# Patient Record
Sex: Female | Born: 1940 | Race: Black or African American | Hispanic: No | Marital: Married | Smoking: Never smoker
Health system: Southern US, Community
[De-identification: ages and names within clinical notes are randomized; demographics above are authoritative.]

## PROBLEM LIST (undated history)

## (undated) DIAGNOSIS — R51 Headache: Secondary | ICD-10-CM

## (undated) DIAGNOSIS — F32A Depression, unspecified: Secondary | ICD-10-CM

## (undated) DIAGNOSIS — F419 Anxiety disorder, unspecified: Secondary | ICD-10-CM

## (undated) DIAGNOSIS — Z9889 Other specified postprocedural states: Secondary | ICD-10-CM

## (undated) DIAGNOSIS — E7800 Pure hypercholesterolemia, unspecified: Secondary | ICD-10-CM

## (undated) DIAGNOSIS — M1990 Unspecified osteoarthritis, unspecified site: Secondary | ICD-10-CM

## (undated) DIAGNOSIS — N390 Urinary tract infection, site not specified: Secondary | ICD-10-CM

## (undated) DIAGNOSIS — F439 Reaction to severe stress, unspecified: Secondary | ICD-10-CM

## (undated) DIAGNOSIS — R112 Nausea with vomiting, unspecified: Secondary | ICD-10-CM

## (undated) DIAGNOSIS — F329 Major depressive disorder, single episode, unspecified: Secondary | ICD-10-CM

## (undated) DIAGNOSIS — K219 Gastro-esophageal reflux disease without esophagitis: Secondary | ICD-10-CM

## (undated) DIAGNOSIS — R0600 Dyspnea, unspecified: Secondary | ICD-10-CM

## (undated) DIAGNOSIS — G2581 Restless legs syndrome: Secondary | ICD-10-CM

## (undated) DIAGNOSIS — I10 Essential (primary) hypertension: Secondary | ICD-10-CM

## (undated) DIAGNOSIS — F0390 Unspecified dementia without behavioral disturbance: Secondary | ICD-10-CM

## (undated) HISTORY — DX: Anxiety disorder, unspecified: F41.9

## (undated) HISTORY — PX: ABDOMINAL HYSTERECTOMY: SHX81

## (undated) HISTORY — DX: Major depressive disorder, single episode, unspecified: F32.9

## (undated) HISTORY — DX: Dyspnea, unspecified: R06.00

## (undated) HISTORY — DX: Restless legs syndrome: G25.81

## (undated) HISTORY — PX: OTHER SURGICAL HISTORY: SHX169

## (undated) HISTORY — DX: Reaction to severe stress, unspecified: F43.9

## (undated) HISTORY — DX: Pure hypercholesterolemia, unspecified: E78.00

## (undated) HISTORY — PX: COLONOSCOPY: SHX174

## (undated) HISTORY — DX: Essential (primary) hypertension: I10

## (undated) HISTORY — DX: Depression, unspecified: F32.A

## (undated) HISTORY — DX: Unspecified osteoarthritis, unspecified site: M19.90

## (undated) HISTORY — DX: Gastro-esophageal reflux disease without esophagitis: K21.9

## (undated) HISTORY — PX: WISDOM TOOTH EXTRACTION: SHX21

## (undated) HISTORY — PX: REPLACEMENT TOTAL KNEE: SUR1224

## (undated) HISTORY — PX: JOINT REPLACEMENT: SHX530

---

## 1998-06-16 ENCOUNTER — Ambulatory Visit (HOSPITAL_COMMUNITY): Admission: RE | Admit: 1998-06-16 | Discharge: 1998-06-16 | Payer: Self-pay | Admitting: Gastroenterology

## 1999-05-04 ENCOUNTER — Ambulatory Visit (HOSPITAL_COMMUNITY): Admission: RE | Admit: 1999-05-04 | Discharge: 1999-05-04 | Payer: Self-pay | Admitting: Emergency Medicine

## 1999-10-11 ENCOUNTER — Encounter: Payer: Self-pay | Admitting: Emergency Medicine

## 1999-10-11 ENCOUNTER — Encounter: Admission: RE | Admit: 1999-10-11 | Discharge: 1999-10-11 | Payer: Self-pay | Admitting: Emergency Medicine

## 2000-07-19 ENCOUNTER — Encounter: Payer: Self-pay | Admitting: Emergency Medicine

## 2000-07-19 ENCOUNTER — Encounter: Admission: RE | Admit: 2000-07-19 | Discharge: 2000-07-19 | Payer: Self-pay | Admitting: Emergency Medicine

## 2000-10-25 ENCOUNTER — Encounter: Payer: Self-pay | Admitting: Emergency Medicine

## 2000-10-25 ENCOUNTER — Encounter: Admission: RE | Admit: 2000-10-25 | Discharge: 2000-10-25 | Payer: Self-pay | Admitting: Emergency Medicine

## 2001-11-02 ENCOUNTER — Encounter: Admission: RE | Admit: 2001-11-02 | Discharge: 2001-11-02 | Payer: Self-pay | Admitting: Emergency Medicine

## 2001-11-02 ENCOUNTER — Encounter: Payer: Self-pay | Admitting: Emergency Medicine

## 2002-11-04 ENCOUNTER — Encounter: Admission: RE | Admit: 2002-11-04 | Discharge: 2002-11-04 | Payer: Self-pay | Admitting: Emergency Medicine

## 2002-11-04 ENCOUNTER — Encounter: Payer: Self-pay | Admitting: Emergency Medicine

## 2002-11-12 ENCOUNTER — Encounter: Admission: RE | Admit: 2002-11-12 | Discharge: 2002-11-12 | Payer: Self-pay | Admitting: Emergency Medicine

## 2002-11-12 ENCOUNTER — Encounter: Payer: Self-pay | Admitting: Emergency Medicine

## 2003-09-19 ENCOUNTER — Ambulatory Visit (HOSPITAL_COMMUNITY): Admission: RE | Admit: 2003-09-19 | Discharge: 2003-09-19 | Payer: Self-pay | Admitting: Gastroenterology

## 2003-09-19 ENCOUNTER — Encounter (INDEPENDENT_AMBULATORY_CARE_PROVIDER_SITE_OTHER): Payer: Self-pay | Admitting: Specialist

## 2003-11-04 ENCOUNTER — Encounter: Admission: RE | Admit: 2003-11-04 | Discharge: 2003-11-04 | Payer: Self-pay | Admitting: Emergency Medicine

## 2004-08-05 ENCOUNTER — Encounter: Admission: RE | Admit: 2004-08-05 | Discharge: 2004-08-05 | Payer: Self-pay | Admitting: Emergency Medicine

## 2004-11-18 ENCOUNTER — Encounter: Admission: RE | Admit: 2004-11-18 | Discharge: 2004-11-18 | Payer: Self-pay | Admitting: Emergency Medicine

## 2005-08-29 ENCOUNTER — Encounter: Admission: RE | Admit: 2005-08-29 | Discharge: 2005-08-29 | Payer: Self-pay | Admitting: Emergency Medicine

## 2005-11-17 ENCOUNTER — Encounter: Admission: RE | Admit: 2005-11-17 | Discharge: 2005-11-17 | Payer: Self-pay | Admitting: Emergency Medicine

## 2006-10-13 ENCOUNTER — Ambulatory Visit: Payer: Self-pay | Admitting: Cardiovascular Disease

## 2006-10-31 ENCOUNTER — Ambulatory Visit: Payer: Self-pay

## 2006-11-18 ENCOUNTER — Inpatient Hospital Stay (HOSPITAL_COMMUNITY): Admission: EM | Admit: 2006-11-18 | Discharge: 2006-11-19 | Payer: Self-pay | Admitting: Emergency Medicine

## 2006-12-12 ENCOUNTER — Encounter: Admission: RE | Admit: 2006-12-12 | Discharge: 2006-12-12 | Payer: Self-pay | Admitting: Emergency Medicine

## 2006-12-21 ENCOUNTER — Encounter: Admission: RE | Admit: 2006-12-21 | Discharge: 2006-12-21 | Payer: Self-pay | Admitting: Emergency Medicine

## 2007-05-30 ENCOUNTER — Encounter: Admission: RE | Admit: 2007-05-30 | Discharge: 2007-05-30 | Payer: Self-pay | Admitting: Emergency Medicine

## 2007-11-06 ENCOUNTER — Ambulatory Visit: Payer: Self-pay | Admitting: Cardiovascular Disease

## 2007-12-13 ENCOUNTER — Encounter: Admission: RE | Admit: 2007-12-13 | Discharge: 2007-12-13 | Payer: Self-pay | Admitting: Emergency Medicine

## 2008-05-15 ENCOUNTER — Encounter: Payer: Self-pay | Admitting: Cardiovascular Disease

## 2008-05-15 ENCOUNTER — Ambulatory Visit: Payer: Self-pay | Admitting: Cardiovascular Disease

## 2008-05-15 DIAGNOSIS — F329 Major depressive disorder, single episode, unspecified: Secondary | ICD-10-CM

## 2008-05-15 DIAGNOSIS — R609 Edema, unspecified: Secondary | ICD-10-CM

## 2008-05-15 DIAGNOSIS — E7800 Pure hypercholesterolemia, unspecified: Secondary | ICD-10-CM

## 2008-05-15 DIAGNOSIS — F341 Dysthymic disorder: Secondary | ICD-10-CM

## 2008-05-15 DIAGNOSIS — M1990 Unspecified osteoarthritis, unspecified site: Secondary | ICD-10-CM | POA: Insufficient documentation

## 2008-05-15 DIAGNOSIS — R0989 Other specified symptoms and signs involving the circulatory and respiratory systems: Secondary | ICD-10-CM

## 2008-05-15 DIAGNOSIS — R9431 Abnormal electrocardiogram [ECG] [EKG]: Secondary | ICD-10-CM

## 2008-05-15 DIAGNOSIS — I10 Essential (primary) hypertension: Secondary | ICD-10-CM

## 2008-05-15 DIAGNOSIS — R0609 Other forms of dyspnea: Secondary | ICD-10-CM | POA: Insufficient documentation

## 2008-11-04 ENCOUNTER — Ambulatory Visit: Payer: Self-pay | Admitting: Cardiovascular Disease

## 2008-12-15 ENCOUNTER — Encounter: Admission: RE | Admit: 2008-12-15 | Discharge: 2008-12-15 | Payer: Self-pay | Admitting: Internal Medicine

## 2009-04-30 ENCOUNTER — Ambulatory Visit: Payer: Self-pay | Admitting: Cardiovascular Disease

## 2009-06-30 ENCOUNTER — Ambulatory Visit: Payer: Self-pay

## 2009-07-24 ENCOUNTER — Telehealth (INDEPENDENT_AMBULATORY_CARE_PROVIDER_SITE_OTHER): Payer: Self-pay | Admitting: *Deleted

## 2009-10-29 ENCOUNTER — Ambulatory Visit: Payer: Self-pay | Admitting: Cardiovascular Disease

## 2009-12-22 ENCOUNTER — Encounter: Admission: RE | Admit: 2009-12-22 | Discharge: 2009-12-22 | Payer: Self-pay | Admitting: Internal Medicine

## 2010-03-14 ENCOUNTER — Encounter: Payer: Self-pay | Admitting: Emergency Medicine

## 2010-03-15 ENCOUNTER — Encounter: Payer: Self-pay | Admitting: Emergency Medicine

## 2010-03-23 NOTE — Assessment & Plan Note (Signed)
Summary: Beto.Dent   CC:  6 month check up.  History of Present Illness: Sole is seen today in followup for hypertension dyspnea and lower extremity edema. She has been doing well. She seems tired and is depressed about not seeing Dr. Lorenz Coaster anymore.  Her new primary is [facility].  She has a lot of trouble with her knees and has ssen Dr. Corliss Skains for injections and Dr. Despina Hick.  She has improved LE edema on diuretics and her BP has been under good control.  She has mild chronic exertional dyspnea but her activity is limited by her knees at this point.  She has had some balance issues and is getting B12 shots now.    Current Problems (verified): 1)  Edema  (ICD-782.3) 2)  Electrocardiogram, Abnormal  (ICD-794.31) 3)  Anxiety Depression  (ICD-300.4) 4)  Dyspnea On Exertion  (ICD-786.09) 5)  Diabetes Mellitus, Type II, Hx of  (ICD-V12.2) 6)  Osteoarthritis  (ICD-715.90) 7)  Hypercholesterolemia  (ICD-272.0) 8)  Hypertension  (ICD-401.9) 9)  Depression  (ICD-311)  Current Medications (verified): 1)  Metformin Hcl 850 Mg Tabs (Metformin Hcl) .... Three Times A Day 2)  Verapamil Hcl Cr 240 Mg Xr24h-Cap (Verapamil Hcl) .... Take One Capsule By Mouth Two Times A Day 3)  Aspirin Ec 325 Mg Tbec (Aspirin) .... Take One Tablet By Mouth Daily 4)  Alprazolam 0.5 Mg Tabs (Alprazolam) .... Two Times A Day 5)  Mirapex 0.125 Mg Tabs (Pramipexole Dihydrochloride) .... Three Times A Day 6)  Metoprolol Succinate 100 Mg Xr24h-Tab (Metoprolol Succinate) .... Take One Tablet By Mouth Daily 7)  Torsemide 10 Mg Tabs (Torsemide) .Marland Kitchen.. 1 Tab By Mouth Once Daily 8)  Glipizide 5 Mg Tabs (Glipizide) .Marland Kitchen.. 1 Tab By Mouth Once Daily 9)  Cozaar 100 Mg Tabs (Losartan Potassium) .Marland Kitchen.. 1 Tab By Mouth Once Daily  Allergies (verified): 1)  ! Sulfa  Past History:  Past Medical History: Last updated: 05/15/2008 Current Problems:  ANXIETY DEPRESSION (ICD-300.4) DYSPNEA ON EXERTION (ICD-786.09) DIABETES MELLITUS, TYPE II, HX  OF (ICD-V12.2) OSTEOARTHRITIS (ICD-715.90) HYPERCHOLESTEROLEMIA (ICD-272.0) HYPERTENSION (ICD-401.9) DEPRESSION (ICD-311)  Restless leg syndrome.   Stress.   Past Surgical History: Last updated: 05/15/2008  Hysterectomy.  Colonoscopy  Family History: Last updated: 05/15/2008 Positive for diabetes.  Social History: Last updated: 05/15/2008 Married  Tobacco Use - Yes.  Alcohol Use - no  Review of Systems       Denies fever, malais, weight loss, blurry vision, decreased visual acuity, cough, sputum, SOB, hemoptysis, pleuritic pain, palpitaitons, heartburn, abdominal pain, melena, lower extremity edema, claudication, or rash.   Vital Signs:  Patient profile:   70 year old female Height:      68 inches Weight:      196 pounds BMI:     29.91 Pulse rate:   60 / minute Resp:     12 per minute BP sitting:   121 / 72  (left arm)  Vitals Entered By: Kem Parkinson (April 30, 2009 8:09 AM)  Physical Exam  General:  Affect appropriate Healthy:  appears stated age HEENT: normal Neck supple with no adenopathy JVP normal no bruits no thyromegaly Lungs clear with no wheezing and good diaphragmatic motion Heart:  S1/S2 no murmur,rub, gallop or click PMI normal Abdomen: benighn, BS positve, no tenderness, no AAA no bruit.  No HSM or HJR Distal pulses intact with no bruits No edema Neuro non-focal Skin warm and dry    Impression & Recommendations:  Problem # 1:  EDEMA (ICD-782.3)  Resolved  with current dose of diuretic.  Low sodium diet  Orders: EKG w/ Interpretation (93000)  Problem # 2:  DYSPNEA ON EXERTION (ICD-786.09) Baseline with no evidence of cardiac limitations The following medications were removed from the medication list:    Lisinopril 20 Mg Tabs (Lisinopril) .Marland Kitchen... Take one tablet by mouth daily    Lisinopril 40 Mg Tabs (Lisinopril) .Marland Kitchen... Take one tablet by mouth daily Her updated medication list for this problem includes:    Verapamil Hcl Cr 240  Mg Xr24h-cap (Verapamil hcl) .Marland Kitchen... Take one capsule by mouth two times a day    Aspirin Ec 325 Mg Tbec (Aspirin) .Marland Kitchen... Take one tablet by mouth daily    Metoprolol Succinate 100 Mg Xr24h-tab (Metoprolol succinate) .Marland Kitchen... Take one tablet by mouth daily    Torsemide 10 Mg Tabs (Torsemide) .Marland Kitchen... 1 tab by mouth once daily    Cozaar 100 Mg Tabs (Losartan potassium) .Marland Kitchen... 1 tab by mouth once daily  Problem # 3:  HYPERTENSION (ICD-401.9) Meds adjusted by Dr Jarold Motto.  Under good control The following medications were removed from the medication list:    Lisinopril 20 Mg Tabs (Lisinopril) .Marland Kitchen... Take one tablet by mouth daily    Lisinopril 40 Mg Tabs (Lisinopril) .Marland Kitchen... Take one tablet by mouth daily Her updated medication list for this problem includes:    Verapamil Hcl Cr 240 Mg Xr24h-cap (Verapamil hcl) .Marland Kitchen... Take one capsule by mouth two times a day    Aspirin Ec 325 Mg Tbec (Aspirin) .Marland Kitchen... Take one tablet by mouth daily    Metoprolol Succinate 100 Mg Xr24h-tab (Metoprolol succinate) .Marland Kitchen... Take one tablet by mouth daily    Torsemide 10 Mg Tabs (Torsemide) .Marland Kitchen... 1 tab by mouth once daily    Cozaar 100 Mg Tabs (Losartan potassium) .Marland Kitchen... 1 tab by mouth once daily  Orders: EKG w/ Interpretation (93000)  Problem # 4:  HYPERCHOLESTEROLEMIA (ICD-272.0) Continue current diet Target LDL less than 130 with no known vascular disease  Patient Instructions: 1)  Your physician wants you to follow-up in: 6 months.  You will receive a reminder letter in the mail two months in advance. If you don't receive a letter, please call our office to schedule the follow-up appointment.   EKG Report  Procedure date:  04/30/2009  Findings:      NSR LAD Voltage for LVH Nonspecific ST/T wave changes Abnromal ECG

## 2010-03-23 NOTE — Assessment & Plan Note (Signed)
Summary: f41m   CC:  check up.  History of Present Illness: [name] is seen today in followup for hypertension dyspnea and lower extremity edema. She has been doing well. She seems tired and is depressed about not seeing Dr. Lorenz Coaster anymore.  Her new primary is [facility].  She has a lot of trouble with her knees and has ssen Dr. Corliss Skains for injections and Dr. Despina Hick.  She has improved LE edema on diuretics and her BP has been under good control.  She has mild chronic exertional dyspnea but her activity is limited by her knees at this point.  She has had some balance issues and is getting B12 shots now.   Current Problems (verified): 1)  Edema  (ICD-782.3) 2)  Electrocardiogram, Abnormal  (ICD-794.31) 3)  Anxiety Depression  (ICD-300.4) 4)  Dyspnea On Exertion  (ICD-786.09) 5)  Diabetes Mellitus, Type II, Hx of  (ICD-V12.2) 6)  Osteoarthritis  (ICD-715.90) 7)  Hypercholesterolemia  (ICD-272.0) 8)  Hypertension  (ICD-401.9) 9)  Depression  (ICD-311)  Current Medications (verified): 1)  Metformin Hcl 850 Mg Tabs (Metformin Hcl) .... Three Times A Day 2)  Verapamil Hcl Cr 240 Mg Xr24h-Cap (Verapamil Hcl) .... Take One Capsule By Mouth Two Times A Day 3)  Aspirin Ec 325 Mg Tbec (Aspirin) .... Take One Tablet By Mouth Daily 4)  Alprazolam 0.5 Mg Tabs (Alprazolam) .... Two Times A Day 5)  Mirapex 0.125 Mg Tabs (Pramipexole Dihydrochloride) .... Three Times A Day 6)  Metoprolol Succinate 100 Mg Xr24h-Tab (Metoprolol Succinate) .... Take One Tablet By Mouth Daily 7)  Torsemide 10 Mg Tabs (Torsemide) .Marland Kitchen.. 1 Tab By Mouth Once Daily 8)  Glipizide 5 Mg Tabs (Glipizide) .Marland Kitchen.. 1 Tab By Mouth Once Daily  Allergies (verified): 1)  ! Sulfa  Past History:  Past Medical History: Last updated: 05/15/2008 Current Problems:  ANXIETY DEPRESSION (ICD-300.4) DYSPNEA ON EXERTION (ICD-786.09) DIABETES MELLITUS, TYPE II, HX OF (ICD-V12.2) OSTEOARTHRITIS (ICD-715.90) HYPERCHOLESTEROLEMIA  (ICD-272.0) HYPERTENSION (ICD-401.9) DEPRESSION (ICD-311)  Restless leg syndrome.   Stress.   Past Surgical History: Last updated: 05/15/2008  Hysterectomy.  Colonoscopy  Family History: Last updated: 05/15/2008 Positive for diabetes.  Social History: Last updated: 05/15/2008 Married  Tobacco Use - Yes.  Alcohol Use - no  Review of Systems       Denies fever, malais, weight loss, blurry vision, decreased visual acuity, cough, sputum, SOB, hemoptysis, pleuritic pain, palpitaitons, heartburn, abdominal pain, melena, lower extremity edema, claudication, or rash.   Vital Signs:  Patient profile:   70 year old female Height:      68 inches Weight:      199 pounds BMI:     30.37 Pulse rate:   80 / minute Resp:     12 per minute BP sitting:   124 / 84  (left arm)  Vitals Entered By: Kem Parkinson (October 29, 2009 1:58 PM)  Physical Exam  General:  Affect appropriate Healthy:  appears stated age HEENT: normal Neck supple with no adenopathy JVP normal no bruits no thyromegaly Lungs clear with no wheezing and good diaphragmatic motion Heart:  S1/S2 no murmur,rub, gallop or click PMI normal Abdomen: benighn, BS positve, no tenderness, no AAA no bruit.  No HSM or HJR Distal pulses intact with no bruits No edema Neuro non-focal Skin warm and dry    Impression & Recommendations:  Problem # 1:  EDEMA (ICD-782.3) Stable  Continue low sodium diet and elevate legs at end of day  Continue current dose of diuretic  Problem # 2:  DYSPNEA ON EXERTION (ICD-786.09) Functional Normal exam and normal EF  Observe The following medications were removed from the medication list:    Cozaar 100 Mg Tabs (Losartan potassium) .Marland Kitchen... 1 tab by mouth once daily Her updated medication list for this problem includes:    Verapamil Hcl Cr 240 Mg Xr24h-cap (Verapamil hcl) .Marland Kitchen... Take one capsule by mouth two times a day    Aspirin Ec 325 Mg Tbec (Aspirin) .Marland Kitchen... Take one tablet by mouth  daily    Metoprolol Succinate 100 Mg Xr24h-tab (Metoprolol succinate) .Marland Kitchen... Take one tablet by mouth daily    Torsemide 10 Mg Tabs (Torsemide) .Marland Kitchen... 1 tab by mouth once daily  Problem # 3:  HYPERTENSION (ICD-401.9) Well controlled The following medications were removed from the medication list:    Cozaar 100 Mg Tabs (Losartan potassium) .Marland Kitchen... 1 tab by mouth once daily Her updated medication list for this problem includes:    Verapamil Hcl Cr 240 Mg Xr24h-cap (Verapamil hcl) .Marland Kitchen... Take one capsule by mouth two times a day    Aspirin Ec 325 Mg Tbec (Aspirin) .Marland Kitchen... Take one tablet by mouth daily    Metoprolol Succinate 100 Mg Xr24h-tab (Metoprolol succinate) .Marland Kitchen... Take one tablet by mouth daily    Torsemide 10 Mg Tabs (Torsemide) .Marland Kitchen... 1 tab by mouth once daily  Patient Instructions: 1)  Your physician recommends that you schedule a follow-up appointment in: 6 MONTHS

## 2010-03-23 NOTE — Progress Notes (Signed)
  Phone Note Outgoing Call   Call placed by: Deliah Goody, RN,  July 24, 2009 4:39 PM Summary of Call: pt aware that event monitor shows NSR, sinus arrythmia Deliah Goody, RN  July 24, 2009 4:39 PM

## 2010-04-29 ENCOUNTER — Ambulatory Visit: Payer: Self-pay | Admitting: Cardiovascular Disease

## 2010-05-07 ENCOUNTER — Encounter: Payer: Self-pay | Admitting: Cardiovascular Disease

## 2010-05-20 ENCOUNTER — Ambulatory Visit: Payer: Self-pay | Admitting: Cardiovascular Disease

## 2010-05-24 ENCOUNTER — Emergency Department (HOSPITAL_COMMUNITY)
Admission: EM | Admit: 2010-05-24 | Discharge: 2010-05-24 | Discharge status: Against Medical Advice | Payer: Medicare Other | Attending: Emergency Medicine | Admitting: Emergency Medicine

## 2010-05-24 DIAGNOSIS — Z0389 Encounter for observation for other suspected diseases and conditions ruled out: Secondary | ICD-10-CM | POA: Insufficient documentation

## 2010-05-31 ENCOUNTER — Inpatient Hospital Stay (HOSPITAL_COMMUNITY): Payer: Medicare Other

## 2010-05-31 ENCOUNTER — Inpatient Hospital Stay (HOSPITAL_COMMUNITY)
Admission: AD | Admit: 2010-05-31 | Discharge: 2010-06-02 | DRG: 866 | Disposition: A | Discharge status: Home / Self Care | Payer: Medicare Other | Source: Ambulatory Visit | Attending: Internal Medicine | Admitting: Internal Medicine

## 2010-05-31 DIAGNOSIS — K219 Gastro-esophageal reflux disease without esophagitis: Secondary | ICD-10-CM | POA: Diagnosis present

## 2010-05-31 DIAGNOSIS — R0789 Other chest pain: Secondary | ICD-10-CM | POA: Diagnosis present

## 2010-05-31 DIAGNOSIS — R112 Nausea with vomiting, unspecified: Secondary | ICD-10-CM | POA: Diagnosis present

## 2010-05-31 DIAGNOSIS — K5900 Constipation, unspecified: Secondary | ICD-10-CM | POA: Diagnosis present

## 2010-05-31 DIAGNOSIS — B9789 Other viral agents as the cause of diseases classified elsewhere: Principal | ICD-10-CM | POA: Diagnosis present

## 2010-05-31 DIAGNOSIS — R269 Unspecified abnormalities of gait and mobility: Secondary | ICD-10-CM | POA: Diagnosis present

## 2010-05-31 LAB — COMPREHENSIVE METABOLIC PANEL
AST: 15 U/L (ref 0–37)
Chloride: 104 mEq/L (ref 96–112)
GFR calc Af Amer: >60 mL/min (ref >60)
Glucose, Bld: 194 mg/dL — ABNORMAL HIGH (ref 70–99)
Potassium: 3.7 mEq/L (ref 3.5–5.1)
Total Bilirubin: 1.2 mg/dL (ref 0.3–1.2)

## 2010-05-31 LAB — URINALYSIS, ROUTINE W REFLEX MICROSCOPIC
Ketones, ur: 15 mg/dL — AB
pH: 6.5 (ref 5.0–8.0)

## 2010-05-31 LAB — URINE MICROSCOPIC-ADD ON

## 2010-05-31 LAB — TROPONIN I: Troponin I: 0.02 ng/mL (ref 0.00–0.06)

## 2010-05-31 LAB — CBC
MCHC: 33.5 g/dL (ref 30.0–36.0)
Platelets: 234 10*3/uL (ref 150–400)

## 2010-06-01 ENCOUNTER — Inpatient Hospital Stay (HOSPITAL_COMMUNITY): Payer: Medicare Other

## 2010-06-01 LAB — GLUCOSE, CAPILLARY
Glucose-Capillary: 120 mg/dL — ABNORMAL HIGH (ref 70–99)
Glucose-Capillary: 202 mg/dL — ABNORMAL HIGH (ref 70–99)

## 2010-06-01 LAB — TROPONIN I: Troponin I: 0.01 ng/mL (ref 0.00–0.06)

## 2010-06-02 LAB — GLUCOSE, CAPILLARY: Glucose-Capillary: 127 mg/dL — ABNORMAL HIGH (ref 70–99)

## 2010-06-02 LAB — TROPONIN I: Troponin I: 0.02 ng/mL (ref 0.00–0.06)

## 2010-06-03 ENCOUNTER — Encounter: Payer: Self-pay | Admitting: Cardiovascular Disease

## 2010-06-03 ENCOUNTER — Ambulatory Visit (INDEPENDENT_AMBULATORY_CARE_PROVIDER_SITE_OTHER): Payer: Medicare Other | Admitting: Cardiovascular Disease

## 2010-06-03 ENCOUNTER — Ambulatory Visit (INDEPENDENT_AMBULATORY_CARE_PROVIDER_SITE_OTHER)
Admission: RE | Admit: 2010-06-03 | Discharge: 2010-06-03 | Disposition: A | Discharge status: Home / Self Care | Payer: Medicare Other | Source: Ambulatory Visit | Attending: Cardiovascular Disease | Admitting: Cardiovascular Disease

## 2010-06-03 DIAGNOSIS — F29 Unspecified psychosis not due to a substance or known physiological condition: Secondary | ICD-10-CM

## 2010-06-03 DIAGNOSIS — R410 Disorientation, unspecified: Secondary | ICD-10-CM

## 2010-06-03 DIAGNOSIS — I10 Essential (primary) hypertension: Secondary | ICD-10-CM

## 2010-06-03 DIAGNOSIS — S0990XA Unspecified injury of head, initial encounter: Secondary | ICD-10-CM

## 2010-06-03 DIAGNOSIS — E7800 Pure hypercholesterolemia, unspecified: Secondary | ICD-10-CM

## 2010-06-03 DIAGNOSIS — R55 Syncope and collapse: Secondary | ICD-10-CM

## 2010-06-03 DIAGNOSIS — R609 Edema, unspecified: Secondary | ICD-10-CM

## 2010-06-03 DIAGNOSIS — R9431 Abnormal electrocardiogram [ECG] [EKG]: Secondary | ICD-10-CM

## 2010-06-03 NOTE — Assessment & Plan Note (Signed)
Likely need for right knee surgery. Multiple CRF;s including DM.  Anterolateral ECG changes.  Lexiscan myovue

## 2010-06-03 NOTE — Assessment & Plan Note (Signed)
F/U labs Dr Jarold Motto  Continue low chol diet

## 2010-06-03 NOTE — Assessment & Plan Note (Signed)
Concerned about Louse's mental status.  With fall and bump on back of head will due CT to R/O subdural

## 2010-06-03 NOTE — Progress Notes (Signed)
[name] is seen today in followup for hypertension dyspnea and lower extremity edema. She has been doing well. She seems tired and is depressed about not seeing Dr. Lorenz Coaster anymore.  Her new primary is [facility].  She has a lot of trouble with her knees and has ssen Dr. Corliss Skains for injections and Dr. Despina Hick. [name] thinks it is time she has surgery.  She has been tripping and falling a lot.     She has improved LE edema on diuretics and her BP has been under good control.  She has mild chronic exertional dyspnea but her activity is limited by her knees at this point.  She has had some balance issues and is getting B12 shots now.   Her mental status was off today.  She appeared slow to mentate.  She says she was in the hospital earlier this week at [facility] for vohmiting and diarhea.  ER records indicate visit for fall but no scan.  No focal neuro signs.   [name] indicated she tripped and fell backward hitting her head on Monday.  ROS: Denies fever, malais, weight loss, blurry vision, decreased visual acuity, cough, sputum, SOB, hemoptysis, pleuritic pain, palpitaitons, heartburn, abdominal pain, melena, lower extremity edema, claudication, or rash.  Called [facility] and got notes from [facility] 4/9-4/11  Viral syndrome  Negative UGI.  Some SSCP ? From GERD  Negative enzymes  ECG with anteroalteral T wave changes.  D/C for outpt F/U with me and primary   General: Affect appropriate Healthy:  appears stated age HEENT: normal Neck supple with no adenopathy JVP normal no bruits no thyromegaly Lungs clear with no wheezing and good diaphragmatic motion Heart:  S1/S2 SEM  murmur,rub, gallop or click PMI normal Abdomen: benighn, BS positve, no tenderness, no AAA no bruit.  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 ALPRAZolam (XANAX) 0.5 MG tablet Take 0.5 mg by mouth at bedtime as needed.        Marland Kitchen aspirin 325 MG tablet Take 325 mg by mouth daily.        Marland Kitchen glipiZIDE (GLUCOTROL) 5 MG tablet 1 tab po qd      . metFORMIN (GLUCOPHAGE) 850 MG tablet Take 850 mg by mouth 3 (three) times daily.        . metoprolol (TOPROL-XL) 100 MG 24 hr tablet Take 100 mg by mouth daily.        Marland Kitchen torsemide (DEMADEX) 10 MG tablet Take 10 mg by mouth daily.        . verapamil (CALAN-SR) 240 MG CR tablet Take 240 mg by mouth at bedtime.        . pramipexole (MIRAPEX) 0.125 MG tablet Take 0.125 mg by mouth 3 (three) times daily.          Allergies  Sulfonamide derivatives  Electrocardiogram:  06/02/10  NSR 57 LAD Anterolateral T wave changes  Assessment and Plan

## 2010-06-03 NOTE — Assessment & Plan Note (Signed)
Continue current diuretic dose  Euvolemic

## 2010-06-03 NOTE — Assessment & Plan Note (Signed)
Well controlled.  Continue current medications and low sodium Dash type diet.    

## 2010-06-03 NOTE — Patient Instructions (Signed)
Your physician has requested that you have a lexiscan myoview. For further information please visit [email or web address] Please follow instruction sheet, as given.   Your physician has requested that you have an echocardiogram. Echocardiography is a painless test that uses sound waves to create images of your heart. It provides your doctor with information about the size and shape of your heart and how well your heart's chambers and valves are working. This procedure takes approximately one hour. There are no restrictions for this procedure.   Non-Cardiac CT scanning, (CAT scanning), is a noninvasive, special x-ray that produces cross-sectional images of the body using x-rays and a computer. CT scans help physicians diagnose and treat medical conditions. For some CT exams, a contrast material is used to enhance visibility in the area of the body being studied. CT scans provide greater clarity and reveal more details than regular x-ray exams.   Your physician recommends that you schedule a follow-up appointment in: 6 MONTHS

## 2010-06-15 ENCOUNTER — Ambulatory Visit (HOSPITAL_BASED_OUTPATIENT_CLINIC_OR_DEPARTMENT_OTHER): Payer: Medicare Other | Admitting: Radiology

## 2010-06-15 ENCOUNTER — Ambulatory Visit (HOSPITAL_COMMUNITY): Payer: Medicare Other | Attending: Cardiovascular Disease | Admitting: Radiology

## 2010-06-15 DIAGNOSIS — R55 Syncope and collapse: Secondary | ICD-10-CM

## 2010-06-15 DIAGNOSIS — E785 Hyperlipidemia, unspecified: Secondary | ICD-10-CM | POA: Insufficient documentation

## 2010-06-15 DIAGNOSIS — R0609 Other forms of dyspnea: Secondary | ICD-10-CM

## 2010-06-15 DIAGNOSIS — I059 Rheumatic mitral valve disease, unspecified: Secondary | ICD-10-CM | POA: Insufficient documentation

## 2010-06-15 DIAGNOSIS — R9431 Abnormal electrocardiogram [ECG] [EKG]: Secondary | ICD-10-CM

## 2010-06-15 DIAGNOSIS — R0789 Other chest pain: Secondary | ICD-10-CM

## 2010-06-15 DIAGNOSIS — I498 Other specified cardiac arrhythmias: Secondary | ICD-10-CM

## 2010-06-15 DIAGNOSIS — E119 Type 2 diabetes mellitus without complications: Secondary | ICD-10-CM | POA: Insufficient documentation

## 2010-06-15 DIAGNOSIS — R0989 Other specified symptoms and signs involving the circulatory and respiratory systems: Secondary | ICD-10-CM | POA: Insufficient documentation

## 2010-06-15 MED ORDER — TECHNETIUM TC 99M TETROFOSMIN IV KIT
11.0000 | PACK | Freq: Once | INTRAVENOUS | Status: AC | PRN
  Administered 2010-06-15: 11 via INTRAVENOUS

## 2010-06-15 MED ORDER — TECHNETIUM TC 99M TETROFOSMIN IV KIT
33.0000 | PACK | Freq: Once | INTRAVENOUS | Status: AC | PRN
  Administered 2010-06-15: 33 via INTRAVENOUS

## 2010-06-15 MED ORDER — REGADENOSON 0.4 MG/5ML IV SOLN
0.4000 mg | Freq: Once | INTRAVENOUS | Status: AC
  Administered 2010-06-15: 0.4 mg via INTRAVENOUS

## 2010-06-15 MED ORDER — REGADENOSON 0.4 MG/5ML IV SOLN: 0.4000 mg | Freq: Once | INTRAVENOUS | Status: DC

## 2010-06-15 NOTE — Progress Notes (Signed)
[facility] SITE 3 NUCLEAR MED [address] [place] [address] [phone]  Cardiology Nuclear Med Study  [name] is a 70 y.o. female [id number] 27-Oct-1940   Nuclear Med Background Indication for Stress Test:  Evaluation for Ischemia and Post Hospital;4/9/12[facility]-Fall/?CP(-ezymes) History: 9/08 Myocardial Perfusion Study NL-EF=65% Cardiac Risk Factors: Family History - CAD, Hypertension, Lipids and NIDDM  Symptoms:  DOE and Syncope; LE EDEMA   Nuclear Pre-Procedure Caffeine/Decaff Intake:  530pm NPO After: 530pm   Lungs:  clear IV 0.9% NS with Angio Cath:  22g  IV Site: R Hand  IV Started by:  Cathlyn Parsons, RN  Chest Size (in):  38 Cup Size: B  Height: 5' 7.5" (1.715 m)  Weight:  194 lb (87.998 kg)  BMI:  Body mass index is 29.94 kg/(m^2). Tech Comments:  Metoprolol held x 24hrs    Nuclear Med Study 1 or 2 day study: 1 day  Stress Test Type:  Lexiscan  Reading MD: Arvilla Meres, MD  Order Authorizing Provider: Dr.Nishan  Resting Radionuclide: Technetium 37m Tetrofosmin  Resting Radionuclide Dose: 11 mCi   Stress Radionuclide:  Technetium 74m Tetrofosmin  Stress Radionuclide Dose: 33  mCi           Stress Protocol Rest HR: 51 Stress HR: 73  135/53 Stress BP: 140/90  Exercise Time (min): n/a METS: n/a   Predicted Max HR: 149 bpm % Max HR: 48.99 bpm Rate Pressure Product: 21308   Dose of Adenosine (mg):  n/a Dose of Lexiscan: 0.4 mg  Dose of Atropine (mg): n/a Dose of Dobutamine: n/a mcg/kg/min (at max HR)  Stress Test Technologist: Frederick Peers, EMT-P  Nuclear Technologist:  Domenic Polite, CNMT     Rest Procedure:  Myocardial perfusion imaging was performed at rest 45 minutes following the intravenous administration of Technetium 20m Tetrofosmin. Rest ECG: SB  Stress Procedure:  The patient received IV Lexiscan 0.4 mg over 15-seconds.  Technetium 49m Tetrofosmin injected at 30-seconds.  There were no significant changes  with Lexiscan.  Quantitative spect images were obtained after a 45 minute delay. Stress ECG: No significant change from baseline ECG  QPS Raw Data Images:  Patient motion noted. Stress Images:  Normal homogeneous uptake in all areas of the myocardium. Rest Images:  Normal homogeneous uptake in all areas of the myocardium. Subtraction (SDS):  No evidence of ischemia. Transient Ischemic Dilatation (Normal <1.22):  .96 Lung/Heart Ratio (Normal <0.45):  .26   Quantitative Gated Spect Images QGS EDV:  98 ml QGS ESV:  33 ml QGS cine images:  NL LV Function; NL Wall Motion QGS EF: 67%  Impression Exercise Capacity:  Lexiscan with no exercise. BP Response:  n/a Clinical Symptoms:  n/a ECG Impression:  No significant ST segment change suggestive of ischemia. Comparison with Prior Nuclear Study: No significant change from previous study  Overall Impression:  Normal stress nuclear study.       Ugo Thoma

## 2010-06-16 NOTE — Progress Notes (Signed)
Routed to dr. Eden Emms.Mirna Mires

## 2010-06-21 NOTE — Progress Notes (Signed)
pt aware of results [name]  

## 2010-07-06 NOTE — Assessment & Plan Note (Signed)
[facility]                            CARDIOLOGY OFFICE NOTE   ALERA, QUEVEDO                    MRN:          [id number]  DATE:11/06/2007                            DOB:          10/31/1940    [name] returns today for followup.  I have taken care of  her and her husband for quite sometime.   Otherwise, she is doing well.  She is a little bit depressed.  Her 72-  year-old daughter finally got married and she does not get to see her as  often.  She is to live next door to her but now she is in [place].  Her new husband is a long distance truck driver and she travels with him  a lot.  This has really distressed [name].   She has significant hypertension, hypercholesterolemia, and diabetes.  She has a fairly abnormal EKG at rest.  She had a stress test in  September 2008 which was normal without evidence of ischemia.  She is  not having chest pain, PND, or orthopnea.  Her biggest problem is  bilateral knee pain.  She has significant osteoarthritis.  She has had a  previous arthroscopic procedure on her right leg.  I told her that, Dr.  Lequita Halt, I would be a good surgeon for her to see if she should  entertain the idea of knee replacement.   Her exertional dyspnea is stable and mild.  She has not gained any extra  weight.  There is no excess edema.  There is no PND or orthopnea.  She  has good LV function by previous echo.   CURRENT MEDICATIONS:  1. Metformin 850 t.i.d.  2. Verapamil 240 b.i.d.  3. Aspirin a day.  4. Alprazolam.  5. Glipizide 5 mg a day.  6. Mirapex 0.125 t.i.d.  7. Lisinopril 20 a day.  8. Metoprolol 100 a day.  9. Caltrate.  10.Klor-Con 20 t.i.d.  11.Furosemide10 a day.   EXAM:  Remarkable for an elderly black female in no distress.  Weight is  213, blood pressure is 112/80, pulse is 70 and regular, respiratory rate  14, and afebrile.  HEENT:  Unremarkable.  NECK:  Carotids normal without bruit.   No lymphadenopathy, thyromegaly,  or JVP elevation.  LUNGS:  Clear.  Good diaphragmatic motion.  No wheezing.  S1 and S2.  Normal heart sounds.  PMI normal.  ABDOMEN:  Benign.  Bowel sounds positive.  No AAA.  No tenderness, no  bruit, no hepatosplenomegaly.  No hepatojugular reflux and tenderness.  EXTREMITIES:  Distal pulses are intact.  No edema.  NEUROLOGIC:  Nonfocal.  SKIN:  Warm and dry.  She has significant crepitus in both knees, right  greater than left with previous arthroscopic surgery on the right.   EKG shows sinus rhythm with fairly marked inferior lateral wall T-wave  changes which are chronic.  She has poor R wave progression as well.   IMPRESSION:  1. Abnormal EKG likely reflective of left ventricular hypertrophy      chronically abnormal without evidence of coronary artery disease.  Continue aspirin therapy.  2. Hypertension, currently well controlled.  Continue current dose of      medications including lisinopril 20 a day.  3. Diabetes.  Follow up with Dr. Lorenz Coaster.  Hemoglobin A1c check q.6      months, currently not on insulin.  4. Anxiety depression, reactive.  Continue p.r.n. benzodiazepines.      Consider addition of selective serotonin reuptake inhibitor in the      future.   Overall, I think [name] is doing well.  She had some nice stories today  to tell about her days at [employer]  She is a Journalist, newspaper, and  her husband, [name], used to cross the railroad tracks from [employer] to visit  her when he finally bought a brick house near campus, she married him.     Noralyn Pick. Eden Emms, MD, [facility]  Electronically Signed    PCN/MedQ  DD: 11/06/2007  DT: 11/06/2007  Job #: [id number]

## 2010-07-06 NOTE — Assessment & Plan Note (Signed)
[facility]                            CARDIOLOGY OFFICE NOTE   [name], [name]                    MRN:          [id number]  DATE:10/13/2006                            DOB:          07-19-1940    [name] returns today for followup.  She is a diabetic with significant  hypertension.  She has not had any documented coronary artery disease.  Her last Myoview, however, was back in 2003.  At that point, she had had  chest pain.   In talking to [place], she has been very upset lately.  Her husband,  [name], who is a long-term patient of mine, has failing health and just  had to start dialysis.  We talked for quite a long time about this.  She  understands that George's kidneys really started failing back in 1995  and he was fortunate not to be dialyzed until this year.  They have been  married for quite a long time and this is really breaking [name] with  regard to the dialysis schedule and some of the chronic weight loss that  [name] has had now.  Her daughter was with her today and we tried to  talk through these issues some.  In regard to [name], she has not had  recurrent chest pain.  She does get exertional dyspnea.  Being a  diabetic, this may be an anginal equivalent.  I told her that it has  been over 5 years since she has had a stress test.  Particularly given  her increased anxiety and depression over her husband's condition, I  would like to do an adenosine Myoview on her.   She cannot walk on a treadmill due to her knee pain.   The patient's exertional dyspnea is mildly increased over the last 6  months.  Her weight has been stable.  There has been no PND or  orthopnea.  She has mild chronic lower extremity edema.  There has been  no cough or fever.  There has been no pleuritic component.  No history  of DVT.  She is a nonsmoker.   She does not get resting dyspnea and, in general, can walk approximately  100 yards before she is  dyspneic.   The patient's review of systems is otherwise negative.   CURRENT MEDICATIONS:  1. Metformin 850 t.i.d.  2. Doxazosin 1 mg daily.  3. Verapamil 240 b.i.d.  4. Atenolol 100 a day.  5. Cozaar 50 a day.  6. An aspirin a day.  7. Hydrochlorothiazide 25 a day.  8. KCl 40 a day.  9. Alprazolam.  10.Glipizide dose unknown.  11.Mirapex 0.125 a day.  12.Lisinopril 20 a day.  13.Lopressor 100 a day.  14.Chlorthalidone 50 a day.   It has been hard to control her blood pressure in the past.  I do not  have a recent BMET on the patient.   EXAM:  Remarkable for somewhat emotional and depressed black elderly  female in no distress.  Affect is depressed.  Weight is 209.  Blood pressure is 150/85, pulse 57 and regular.  Respiratory rate  is 14.  She is afebrile.  HEENT:  Normal.  Carotids normal without bruit.  There is no lymphadenopathy.  No  thyromegaly.  No JVP elevation.  LUNGS:  Clear with good diaphoretic motion.  No wheezing.  There is an  S1, S2 with an S4 gallop and a systolic ejection murmur.  PMI is normal.  ABDOMEN:  Benign.  She is status post partial hysterectomy.  No AAA.  Bowel sounds positive.  No tenderness.  No hepatosplenomegaly.  No  hepatojugular reflux.  Pulses are intact with trace lower extremity edema.  NEURO:  Nonfocal.  SKIN:  Warm and dry.  There is no muscular weakness.   IMPRESSION:  1. Multiple coronary risk factors including fairly extreme      hypertension, diabetes, and family history.  Some exertional      dyspnea.  Check Myoview to rule out ischemic equivalent.  2. Exertional dyspnea, likely related to deconditioning.  We will      check a BMET and a BNP to make sure her filling pressures are not      high with regard to diastolic dysfunction.  She will continue her      current dose of diuretics.  3. Lower extremity edema.  Stable.  Low salt diet.      Hydrochlorothiazide 25 a day with potassium replacement.  4. Hypertension.   Currently well-controlled.  She is on multiple      medications for this.  She does have an S4 gallop, but I think that      her blood pressure is fairly well controlled.  5. Abnormal EKG.  Dr. Lorenz Coaster was concerned about this.  Her EKG today      shows sinus rhythm at a rate of 57.  Her EKG has always been      abnormal with inferolateral T wave changes.  This dates back as far      as 2003.  I suspect it is from left ventricular hypertrophy.  There      has been absolutely no change in her EKG over the last 5 years as      far as I can tell.  She is to have a Myoview anyway with regard to      risks for coronary artery disease given her long-standing diabetes.  6. Anxiety and depression.  I will leave it [name] to Dr. Lorenz Coaster to      whether she needs an SSRI.  Clearly, she has some depression about      her husband's health and needs to work through this.   I will see [name] back in 6 months so long as her lab work and Myoview  look fine.     Noralyn Pick. Eden Emms, MD, [facility]  Electronically Signed    PCN/MedQ  DD: 10/13/2006  DT: 10/14/2006  Job #: [id number]

## 2010-07-06 NOTE — Discharge Summary (Signed)
NAMEHALEI, HANOVER             ACCOUNT NO.:  [id number]   MEDICAL RECORD NO.:  [id number]          PATIENT TYPE:  INP   LOCATION:  5704                         FACILITY:  [facility]   PHYSICIAN:  Wilson Singer, M.D.DATE OF BIRTH:  04-29-1940   DATE OF ADMISSION:  11/17/2006  DATE OF DISCHARGE:  11/19/2006                               DISCHARGE SUMMARY   FINAL DISCHARGE DIAGNOSES:  1. Nausea and vomiting for 1 week, unclear etiology.  2. Stress.  3. Type 2 diabetes mellitus.  4. Restless leg syndrome.  5. Hypertension.   MEDICATIONS ON DISCHARGE:  1. Metoprolol 100 mg daily.  2. Lisinopril 20 mg daily.  3. Aspirin 81 mg daily.  4. Mirapex 0.125 mg t.i.d.   CONDITION ON DISCHARGE:  Stable.   HISTORY:  This 70 year old lady was admitted with nausea and vomiting  for 1 week.  Please see initial history and physical examination done by  Dr. Lonia Blood.   HOSPITAL PROGRESS:  The patient was admitted and given intravenous  fluids and antiemetics.  She also underwent an ultrasound of the abdomen  which was entirely normal/negative.  There was no evidence of gallstone  pathology.  There was no evidence of pancreatitis.  Her lipase was  normal.  She did well just overnight and was able to tolerate clear  fluids.  She proceeded with a diet, and she kept this down also without  any nausea, vomiting or any abdominal pain.  Today, she feels back to  her normal self and tells me that she has been under a lot of stress  with her daughter who wanted to marry a man that her and her husband do  not approve of.   PHYSICAL EXAMINATION:  VITAL SIGNS:  Today, temperature 98.6, blood  pressure 150/85, pulse 56, saturation 96% on room air.  ABDOMEN:  Soft, nontender.  LUNGS:  Fields are clear.   Investigations today show a sodium of 143, potassium 3.2, bicarbonate  28, BUN 16, creatinine 0.79, hemoglobin 12.1, white blood cell count  4.5, platelets 211.   FURTHER DISPOSITION:  We are  giving her potassium chloride 40 mEq today,  prior to discharge, to supplement her hypokalemia, and I understand she  is due to have some lab work done tomorrow and then see Dr. Lorenz Coaster the  next day after that.  I have told her to keep these appointments, and  she will follow up with him.      Wilson Singer, M.D.  Electronically Signed     NCG/MEDQ  D:  11/19/2006  T:  11/19/2006  Job:  [id number]   cc:   Reuben Likes, M.D.

## 2010-07-06 NOTE — H&P (Signed)
NAMEROMELL, CAVANAH             ACCOUNT NO.:  [id number]   MEDICAL RECORD NO.:  [id number]          PATIENT TYPE:  INP   LOCATION:  1824                         FACILITY:  [facility]   PHYSICIAN:  Lonia Blood, M.D.       DATE OF BIRTH:  09/27/1940   DATE OF ADMISSION:  11/17/2006  DATE OF DISCHARGE:                              HISTORY & PHYSICAL   PRIMARY CARE PHYSICIAN:  Reuben Likes, M.D.   CHIEF COMPLAINT:  Nausea and vomiting.   HISTORY OF PRESENT ILLNESS:  Ms. Lavery is a 70 year old African-  American woman with a history of diabetes who presented to [facility]  emergency room with reported 1 week of cough and vomiting.  The patient  reports that for the past 24 hours she has been vomiting continuously.  She denies any chest pain.  She denies any abdominal pain.  She has seen  her primary care physician was prescribed some Reglan, but she has been  vomiting the medication.   PAST MEDICAL HISTORY:  1. Restless leg syndrome.  2. Diabetes mellitus.  3. Hypertension.  4. Hysterectomy.  5. Anxiety and depression.   HOME MEDICATIONS:  1. Verapamil 240 mg daily.  2. Chlorthalidone 50 mg daily.  3. Metformin 850 mg twice a day.  4. Aspirin 325 mg daily.  5. Glipizide daily.  6. Vioxx daily.  7. Xanax daily.  8. Potassium 40 mEq daily.  9. Lisinopril 20 mg daily.  10.Lopressor 100 mg daily.   SOCIAL HISTORY:  The patient is married, lives with her husband.  She  does smoke cigarettes, does not drink alcohol.   FAMILY HISTORY:  Positive for diabetes.   REVIEW OF SYSTEMS:  The patient is currently experiencing acute attack  of restless leg syndrome and is very anxious and troubled.  Other  systems as per HPI.  All other systems are negative.   PHYSICAL EXAMINATION UPON ADMISSION:  VITAL SIGNS:  Temperature of 98.8,  blood pressure 140/80, pulse 68, respirations 80, saturation 96% on room  air.  GENERAL APPEARANCE:  A mildly obese African-American woman in moderate  distress, alert and oriented to place, person and time.  HEENT:  Head:  Normocephalic, atraumatic.  Eyes pupils equal, round  react to light and accommodation.  Extraocular movements intact.  Throat  clear.  NECK:  Supple.  No JVD.  CHEST:  Clear without wheeze, rhonchi, or crackles.  HEART:  Regular rate and rhythm without murmurs, rubs or gallop.  ABDOMEN:  Soft, nontender, nondistended.  Bowel sounds are present.  EXTREMITIES:  Lower Extremities have no edema.  SKIN:  Warm and dry without any rashes.   LABORATORY VALUES ON ADMISSION:  White blood cell count 7.3, hemoglobin  12.3, platelet count 239.  Sodium 141, potassium 2.7, chloride 105,  glucose 177, albumin 3.8, protein 6.8, AST 17, ALT 15, alkaline  phosphatase 46, and bilirubin is 1.6.   ASSESSMENT AND PLAN:  1. Nausea and vomiting of unclear etiology.  Will rule out gallbladder      disease.  Rule out pancreatitis.  Also consider possibility of  diabetic gastroparesis versus functional disorder.  Will place the      patient on Reglan around the clock.  Hold metformin and glipizide      for now.  Will use intravenous medications and subcutaneous insulin      to hopefully control the nausea.  2. Acute attack of restless leg syndrome.  We will try to get the      patient back on her Mirapex and  see if that will help..  3. Hypertension.  Will use metoprolol and lisinopril for now and will      adjust medications as needed.  4. Diabetes mellitus.  Will check a hemoglobin A1c to look for chronic      control and use Lantus and sliding scale.      Lonia Blood, M.D.  Electronically Signed     SL/MEDQ  D:  11/18/2006  T:  11/18/2006  Job:  [id number]   cc:   Reuben Likes, M.D.

## 2010-07-06 NOTE — Discharge Summary (Signed)
NAME:  [name], [name]             ACCOUNT NO.:  [id number]  MEDICAL RECORD NO.:  [id number]           PATIENT TYPE:  I  LOCATION:  1341                         FACILITY:  [facility]  PHYSICIAN:  Barry Dienes. Eloise Harman, M.D.DATE OF BIRTH:  Jul 21, 1940  DATE OF ADMISSION:  05/31/2010 DATE OF DISCHARGE:  06/02/2010                              DISCHARGE SUMMARY   CHIEF COMPLAINT:  Nausea and vomiting for 3 days with inability to keep anything down.  HISTORY OF PRESENT ILLNESS:  The patient is a 70 year old African- American woman who is seen in the office for evaluation of nausea and vomiting 3 days prior to hospital admission.  She had been unable to keep food, fluids, or medications down over this time.  At her last office visit in March 2012, she had moderate hypertension with blood pressure of 174/104 and her metoprolol was increased to 50 mg twice daily.  Her daughter indicates that she intermittently takes her medications and is somewhat stubborn in terms of assistance with organizing her medications.  Over the past 2 days, she has not been able to take any medications or food or fluid.  REVIEW OF SYSTEMS:  She denied recent fever, chills, fatigue, or weight loss.  She has chronic blurring of her vision.  She has not complained of dysphagia for solids or chest pain, palpitations, shortness of breath.  She had had mild constipation recently.  She denied dysuria or frequency.  She complains of some chronic diffuse arthralgias and moderate bilateral knee pain with chronic gait instability for which she declined to use a walker or cane on a regular basis.  She also denied symptoms of depression or anxiety.  Her family does indicate there is some issue with mild short-term memory deficits.  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  PHYSICIANS INVOLVED IN CARE:  Dr. Charlton Haws, Dr. Kathryne Hitch, Dr. Charna Elizabeth, Dr. Rosario Jacks [facility]).  PAST SURGICAL HISTORY:  1996, total abdominal hysterectomy; February 2011, colonoscopy showed few diverticula and small internal hemorrhoids.  FAMILY HISTORY:  Her father is deceased and had diabetes mellitus and kidney failure.  Her mother is deceased from stroke.  She has a brother who lives in [place] and a daughter aged 60 years.  SOCIAL HISTORY:  She is married and lives with her husband, [name].  She is a retired person and is a nonsmoker and does not drink alcohol.  INITIAL PHYSICAL EXAM:  VITAL SIGNS:  Blood pressure 174/104, pulse 80, respirations 20, temperature 98.1 degrees Fahrenheit, weight 193 pounds which is down 8.8 pounds since May 07, 2010. GENERAL:  In general, she is a mildly overweight black woman who is in no apparent distress. HEENT:  Head, eyes, ears, nose and throat exam was within normal limits. NECK:  Supple and without jugular venous distention or carotid bruit. CHEST:  Clear to auscultation. HEART:  Heart had a regular rate and rhythm without significant murmur or gallop.  There was no carotid bruit and pedal pulses were normal. She had bilateral 1+ leg edema. ABDOMEN:  Soft with normal bowel sounds and no hepatosplenomegaly or tenderness. NEUROLOGIC EXAM:  She is alert and oriented x3.  Cranial nerves II through XII were normal, reflexes were 2+ and symmetric, motor strength was normal, she walked with a somewhat wide-based gait but was able  to ambulate independently.  ADDITIONAL FINDINGS:  And orthostatic vital sign check showed supine vitals of blood pressure 186/90 with pulse 56.  It with sitting changed to blood pressure 176/100 with pulse 70.  In addition, she was given some tea while in the office and after approximately 30 minutes vomited tea like material, though it did not have a coffee-ground appearance.  INITIAL LABORATORY STUDIES:  White blood cells 9.3, hemoglobin 12.7, hematocrit 37.9, platelets 234.  Serum sodium 143, potassium 3.7, chloride 104, carbon dioxide 29, glucose 194, BUN 14, creatinine 0.77, estimated GFR was greater than 60, total protein 7.4, albumin 4.1, alkaline phosphatase 51, total bilirubin 1.2, troponin-I level was 0.02. Urinalysis was nitrite negative with specific gravity 1.029.  Acute abdomen series, x-rays showed an unremarkable bowel gas pattern with no free intra-abdominal air and scattered high-density material throughout the colon, most prominent in the cecum suggestive of recent ingestion of material with high density but no acute cardiopulmonary disease.  A follow-up abdomen x-ray showed moderate stool throughout the colon.  HOSPITAL COURSE:  The patient was admitted to a medical bed with telemetry.  She showed no arrhythmias while in the hospital.  She had an upper GI series done with small-bowel follow-through which was a totally normal study.  COMPLICATIONS:  None.  CONDITION ON DISCHARGE:  The night prior to discharge, she slept well, although she had transient mild substernal chest discomfort while lying supine.  She had serial cardiac isoenzymes that had been normal.  She had been eating and drinking well on the day prior to hospital discharge.  She denied any nausea, shortness of breath, or chest discomfort at the time of discharge.  RECENT PHYSICAL EXAMINATION:  VITAL SIGNS:  Most recent physical exam shows vital signs of blood pressure 161/92, pulse 57, respirations  20, temperature 98.2, pulse oxygen saturation was 96% on room air. GENERAL:  In general, she is an elderly black woman who is in no apparent distress. CHEST:  Clear to auscultation. HEART:  Heart had a regular rate and rhythm without significant murmur or gallop. ABDOMEN:  Abdomen had normal bowel sounds and no hepatosplenomegaly or tenderness. EXTREMITIES:  Without cyanosis, clubbing, or edema. NEUROLOGICAL EXAM:  Nonfocal and she was able to walk independently.  DISCHARGE DIAGNOSES: 1. Nausea and vomiting, possibly from constipation or a viral     gastrointestinal enteritis. 2. Hypertension, essential, labile. 3. Medication noncompliance. 4. Diabetes mellitus, type 2, with microalbuminuria. 5. Hyperlipidemia. 6. Restless legs syndrome. 7. Bilateral knee osteoarthritis. 8. Gait instability. 9. 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 10.Vitamin B12 deficiency. 11.Vitamin D deficiency. 12.Diverticulosis.  DISCHARGE MEDICATIONS: 1. Dulcolax 10 mg suppository up to 3 times daily as needed for     constipation. 2. Nitroglycerin 0.4 mg sublingual p.r.n. chest pain. 3. Metformin extended release 500 mg take 1 tablet by mouth every     evening for  7 days, then 1 tablet twice daily. 4. Alprazolam 0.5 mg p.o. twice daily. 5. Ecotrin 325 mg p.o. daily. 6. Glipizide XL 5 mg p.o. q.a.m. 7. Losartan 100 mg p.o. daily. 8. Methocarbamol 500 mg p.o. twice daily. 9. Toprol XL 100 mg p.o. daily. 10.Pramipexole 0.25 mg p.o. t.i.d. 11.Torsemide 10 mg p.o. daily. 12.Tramadol take 1 to 2 tablets p.o. up to twice daily as needed for     moderate pain.  DISPOSITION AND FOLLOWUP:  She was advised to increase activity slowly and to avoid concentrated strict sweets.  She was also advised to schedule a followup visit with Dr. Jarome Matin in approximately 2 weeks following discharge.  She was also advised to schedule a followup visit with her cardiologist, Dr. Charlton Haws, in approximately 2 to 4 weeks following discharge to consider further evaluation of her atypical chest discomfort.          ______________________________ Barry Dienes. Eloise Harman, M.D.     DGP/MEDQ  D:  06/15/2010  T:  06/15/2010  Job:  [id number]  cc:   Noralyn Pick. Eden Emms, MD, [facility] [address]  [address] [place] [address]  Electronically Signed by Jarome Matin M.D. on 07/06/2010 10:45:55 AM

## 2010-07-09 NOTE — Op Note (Signed)
NAME:  [name], [name]                       ACCOUNT NO.:  [id number]   MEDICAL RECORD NO.:  [id number]                   PATIENT TYPE:  AMB   LOCATION:  ENDO                                 FACILITY:  [facility]   PHYSICIAN:  Anselmo Rod, M.D.               DATE OF BIRTH:  1940/08/01   DATE OF PROCEDURE:  09/19/2003  DATE OF DISCHARGE:                                 OPERATIVE REPORT   PROCEDURE PERFORMED:  Colonoscopy with snare polypectomy times two.   ENDOSCOPIST:  Charna Elizabeth, M.D.   INSTRUMENT USED:  Olympus video colonoscope.   INDICATIONS FOR PROCEDURE:  The patient is a 70 year old African-American  female undergoing screening colonoscopy.  The patient has had a history of  adenomatous polyps removed in the past and has had recent guaiac positive  stools.  Rule out recurrent polyps.   PREPROCEDURE PREPARATION:  Informed consent was procured from the patient.  The patient was fasted for eight hours prior to the procedure and prepped  with a bottle of magnesium citrate and a gallon of GoLYTELY the night prior  to the procedure.   PREPROCEDURE PHYSICAL:  The patient had stable vital signs.  Neck supple.  Chest clear to auscultation.  S1 and S2 regular.  Abdomen soft with normal  bowel sounds.   DESCRIPTION OF PROCEDURE:  The patient was placed in left lateral decubitus  position and sedated with 75 mg of Demerol and 10 mg of Versed in slow  incremental doses.  Once the patient was adequately sedated and maintained  on low flow oxygen and continuous cardiac monitoring, the Olympus video  colonoscope was advanced from the rectum to the cecum.  The appendicular  orifice and ileocecal valve were clearly visualized and photographed.  The  terminal ileum appeared healthy and without lesions.  Two small sessile  polyps were snared, one from the proximal right colon, one from the distal  right colon.  These were placed in the same bottle.  There were few early  sigmoid  diverticula present.  No other abnormalities were noted.  The  patient tolerated the procedure well without complications.  Retroflexion in  the rectum revealed no abnormalities.   IMPRESSION:  1. Early sigmoid diverticular disease.  2. Two polyps snared one from the proximal transverse colon, one from the     distal transverse colon.  Otherwise unrevealing colonoscopy.   RECOMMENDATIONS:  1. Continue high fiber diet with liberal fluid intake.  2. Await pathology results.  3. Repeat colorectal cancer screening depending on pathology results.  4. Outpatient followup for repeat guaiac testing.  Further recommendation     will be made at that time.  Anselmo Rod, M.D.    JNM/MEDQ  D:  09/19/2003  T:  09/19/2003  Job:  [id number]   cc:   Reuben Likes, M.D.  [address]  [place]  [place] [address]  Fax: [phone]

## 2010-09-15 ENCOUNTER — Observation Stay (HOSPITAL_COMMUNITY)
Admission: EM | Admit: 2010-09-15 | Discharge: 2010-09-17 | Disposition: A | Discharge status: Home / Self Care | Payer: Medicare Other | Attending: Internal Medicine | Admitting: Internal Medicine

## 2010-09-15 ENCOUNTER — Emergency Department (HOSPITAL_COMMUNITY): Payer: Medicare Other

## 2010-09-15 DIAGNOSIS — R112 Nausea with vomiting, unspecified: Principal | ICD-10-CM | POA: Insufficient documentation

## 2010-09-15 DIAGNOSIS — E876 Hypokalemia: Secondary | ICD-10-CM | POA: Insufficient documentation

## 2010-09-15 DIAGNOSIS — Z91199 Patient's noncompliance with other medical treatment and regimen due to unspecified reason: Secondary | ICD-10-CM | POA: Insufficient documentation

## 2010-09-15 DIAGNOSIS — R809 Proteinuria, unspecified: Secondary | ICD-10-CM | POA: Insufficient documentation

## 2010-09-15 DIAGNOSIS — G2581 Restless legs syndrome: Secondary | ICD-10-CM | POA: Insufficient documentation

## 2010-09-15 DIAGNOSIS — Z9119 Patient's noncompliance with other medical treatment and regimen: Secondary | ICD-10-CM | POA: Insufficient documentation

## 2010-09-15 DIAGNOSIS — Z96659 Presence of unspecified artificial knee joint: Secondary | ICD-10-CM | POA: Insufficient documentation

## 2010-09-15 DIAGNOSIS — E860 Dehydration: Secondary | ICD-10-CM | POA: Insufficient documentation

## 2010-09-15 DIAGNOSIS — M1710 Unilateral primary osteoarthritis, unspecified knee: Secondary | ICD-10-CM | POA: Insufficient documentation

## 2010-09-15 DIAGNOSIS — E119 Type 2 diabetes mellitus without complications: Secondary | ICD-10-CM | POA: Insufficient documentation

## 2010-09-15 DIAGNOSIS — E785 Hyperlipidemia, unspecified: Secondary | ICD-10-CM | POA: Insufficient documentation

## 2010-09-15 DIAGNOSIS — E539 Vitamin B deficiency, unspecified: Secondary | ICD-10-CM | POA: Insufficient documentation

## 2010-09-15 DIAGNOSIS — K5900 Constipation, unspecified: Secondary | ICD-10-CM | POA: Insufficient documentation

## 2010-09-15 DIAGNOSIS — R269 Unspecified abnormalities of gait and mobility: Secondary | ICD-10-CM | POA: Insufficient documentation

## 2010-09-15 DIAGNOSIS — I10 Essential (primary) hypertension: Secondary | ICD-10-CM | POA: Insufficient documentation

## 2010-09-15 DIAGNOSIS — E538 Deficiency of other specified B group vitamins: Secondary | ICD-10-CM | POA: Insufficient documentation

## 2010-09-15 DIAGNOSIS — G43909 Migraine, unspecified, not intractable, without status migrainosus: Secondary | ICD-10-CM | POA: Insufficient documentation

## 2010-09-15 DIAGNOSIS — M47812 Spondylosis without myelopathy or radiculopathy, cervical region: Secondary | ICD-10-CM | POA: Insufficient documentation

## 2010-09-15 DIAGNOSIS — T502X5A Adverse effect of carbonic-anhydrase inhibitors, benzothiadiazides and other diuretics, initial encounter: Secondary | ICD-10-CM | POA: Insufficient documentation

## 2010-09-15 LAB — COMPREHENSIVE METABOLIC PANEL WITH GFR
ALT: 10 U/L (ref 0–35)
AST: 10 U/L (ref 0–37)
Albumin: 4.5 g/dL (ref 3.5–5.2)
Alkaline Phosphatase: 62 U/L (ref 39–117)
BUN: 25 mg/dL — ABNORMAL HIGH (ref 6–23)
CO2: 29 meq/L (ref 19–32)
Calcium: 10.8 mg/dL — ABNORMAL HIGH (ref 8.4–10.5)
Chloride: 99 meq/L (ref 96–112)
Creatinine, Ser: 0.82 mg/dL (ref 0.50–1.10)
GFR calc Af Amer: >60 mL/min
GFR calc non Af Amer: >60 mL/min
Glucose, Bld: 255 mg/dL — ABNORMAL HIGH (ref 70–99)
Potassium: 3 meq/L — ABNORMAL LOW (ref 3.5–5.1)
Sodium: 143 meq/L (ref 135–145)
Total Bilirubin: 0.8 mg/dL (ref 0.3–1.2)
Total Protein: 8 g/dL (ref 6.0–8.3)

## 2010-09-15 LAB — URINALYSIS, ROUTINE W REFLEX MICROSCOPIC
Ketones, ur: 40 mg/dL — AB
Nitrite: NEGATIVE
Protein, ur: >300 mg/dL — AB
Specific Gravity, Urine: 1.031 — ABNORMAL HIGH (ref 1.005–1.030)
Urobilinogen, UA: 0.2 mg/dL (ref 0.0–1.0)

## 2010-09-15 LAB — TROPONIN I: Troponin I: <0.3 ng/mL

## 2010-09-15 LAB — GLUCOSE, CAPILLARY
Glucose-Capillary: 242 mg/dL — ABNORMAL HIGH (ref 70–99)
Glucose-Capillary: 225 mg/dL — ABNORMAL HIGH (ref 70–99)

## 2010-09-15 LAB — CBC
HCT: 38.5 % (ref 36.0–46.0)
Hemoglobin: 13.3 g/dL (ref 12.0–15.0)
MCH: 27.9 pg (ref 26.0–34.0)
MCHC: 34.5 g/dL (ref 30.0–36.0)
RBC: 4.76 MIL/uL (ref 3.87–5.11)

## 2010-09-15 LAB — DIFFERENTIAL
Basophils Relative: 0 % (ref 0–1)
Eosinophils Absolute: 0 10*3/uL (ref 0.0–0.7)
Eosinophils Relative: 0 % (ref 0–5)
Lymphs Abs: 1 10*3/uL (ref 0.7–4.0)
Monocytes Relative: 5 % (ref 3–12)

## 2010-09-15 MED ORDER — IOHEXOL 300 MG/ML  SOLN
100.0000 mL | Freq: Once | INTRAMUSCULAR | Status: AC | PRN
  Administered 2010-09-15: 100 mL via INTRAVENOUS

## 2010-09-16 ENCOUNTER — Observation Stay (HOSPITAL_COMMUNITY): Payer: Medicare Other

## 2010-09-16 LAB — COMPREHENSIVE METABOLIC PANEL
ALT: 9 U/L (ref 0–35)
Albumin: 3.9 g/dL (ref 3.5–5.2)
Alkaline Phosphatase: 55 U/L (ref 39–117)
BUN: 26 mg/dL — ABNORMAL HIGH (ref 6–23)
Chloride: 98 mEq/L (ref 96–112)
GFR calc Af Amer: >60 mL/min (ref >60)
Glucose, Bld: 147 mg/dL — ABNORMAL HIGH (ref 70–99)
Potassium: 2.7 mEq/L — CL (ref 3.5–5.1)
Sodium: 139 mEq/L (ref 135–145)
Total Bilirubin: 0.7 mg/dL (ref 0.3–1.2)
Total Protein: 7.4 g/dL (ref 6.0–8.3)

## 2010-09-16 LAB — BASIC METABOLIC PANEL
CO2: 29 mEq/L (ref 19–32)
Calcium: 9.7 mg/dL (ref 8.4–10.5)
Creatinine, Ser: 0.72 mg/dL (ref 0.50–1.10)
GFR calc non Af Amer: >60 mL/min (ref >60)
Glucose, Bld: 146 mg/dL — ABNORMAL HIGH (ref 70–99)

## 2010-09-16 LAB — CBC
HCT: 37.1 % (ref 36.0–46.0)
Hemoglobin: 11.8 g/dL — ABNORMAL LOW (ref 12.0–15.0)
MCH: 27 pg (ref 26.0–34.0)
MCHC: 33.1 g/dL (ref 30.0–36.0)
MCV: 80.7 fL (ref 78.0–100.0)
MCV: 81.7 fL (ref 78.0–100.0)
RBC: 4.37 MIL/uL (ref 3.87–5.11)
RBC: 4.6 MIL/uL (ref 3.87–5.11)
RDW: 14 % (ref 11.5–15.5)
WBC: 10.2 10*3/uL (ref 4.0–10.5)

## 2010-09-16 LAB — GLUCOSE, CAPILLARY: Glucose-Capillary: 186 mg/dL — ABNORMAL HIGH (ref 70–99)

## 2010-09-16 LAB — DIFFERENTIAL
Basophils Absolute: 0 10*3/uL (ref 0.0–0.1)
Eosinophils Relative: 0 % (ref 0–5)
Lymphocytes Relative: 13 % (ref 12–46)
Lymphs Abs: 1.3 10*3/uL (ref 0.7–4.0)
Neutro Abs: 8 10*3/uL — ABNORMAL HIGH (ref 1.7–7.7)
Neutrophils Relative %: 78 % — ABNORMAL HIGH (ref 43–77)

## 2010-09-17 ENCOUNTER — Observation Stay (HOSPITAL_COMMUNITY): Payer: Medicare Other

## 2010-09-17 LAB — BASIC METABOLIC PANEL
BUN: 21 mg/dL (ref 6–23)
GFR calc Af Amer: >60 mL/min (ref >60)
GFR calc non Af Amer: >60 mL/min (ref >60)
Potassium: 3.6 mEq/L (ref 3.5–5.1)
Sodium: 141 mEq/L (ref 135–145)

## 2010-09-17 LAB — GLUCOSE, CAPILLARY
Glucose-Capillary: 184 mg/dL — ABNORMAL HIGH (ref 70–99)
Glucose-Capillary: 269 mg/dL — ABNORMAL HIGH (ref 70–99)

## 2010-09-17 LAB — CBC
Hemoglobin: 12.3 g/dL (ref 12.0–15.0)
MCH: 27.3 pg (ref 26.0–34.0)
Platelets: 226 10*3/uL (ref 150–400)
RBC: 4.5 MIL/uL (ref 3.87–5.11)
WBC: 6.2 10*3/uL (ref 4.0–10.5)

## 2010-09-18 LAB — URINE CULTURE: Special Requests: NEGATIVE

## 2010-09-24 ENCOUNTER — Other Ambulatory Visit: Payer: Self-pay | Admitting: Gastroenterology

## 2010-09-28 ENCOUNTER — Other Ambulatory Visit: Payer: Self-pay | Admitting: Gastroenterology

## 2010-09-29 ENCOUNTER — Other Ambulatory Visit: Payer: Self-pay | Admitting: Gastroenterology

## 2010-10-06 NOTE — Discharge Summary (Signed)
NAMEROYLENE, HEATON NO.:  [id number]  MEDICAL RECORD NO.:  [id number]  LOCATION:  1510                         FACILITY:  [facility]  PHYSICIAN:  Barry Dienes. Eloise Harman, M.D.DATE OF BIRTH:  05-27-1940  DATE OF ADMISSION:  09/15/2010 DATE OF DISCHARGE:  09/17/2010                              DISCHARGE SUMMARY   PERTINENT FINDINGS:  The patient is a 70 year old African American woman who presented to our office with a complaint of nausea and vomiting. She also had constipation and not had a bowel movement for several days prior to hospital admission.  For the past 3 days prior to admission, she was unable to keep any food or liquids down.  In our office lobby, she had water and regurgitated it several minutes later.  She had not had any substantial food or liquids since September 12, 2010.  She had not had recent fever, chills, chest pain, shortness of breath, dysuria, or frequency.  She feels globally weak. Review of systems is otherwise significant for a feeling quite fatigued. She denied nasal congestion, cough, or shortness of breath.  She continues to complain of chronic stiffness and arthritis in several joints particularly in her knees and she uses a single prong cane to ambulate.  She had not had any recent falls, headaches, anxiety, or depression.  MEDICATIONS:  Medications prior to hospitalizations: 1. Metformin 850 mg p.o. t.i.d. 2. Alprazolam 0.5 mg p.o. twice daily p.r.n. anxiety. 3. Verapamil slow release 240 mg p.o. twice daily. 4. Tramadol 50 mg take 1-2 tablets p.o. b.i.d. p.r.n. moderate pain. 5. Glipizide XL 5 mg p.o. once daily. 6. Metoprolol succinate 100 mg p.o. twice daily. 7. Vitamin B12 500 mcg p.o. once daily. 8. Mirapex 0.25 mg p.o. t.i.d. 9. Vitamin D 50,000 units p.o. once monthly. 10.Aspirin 325 mg p.o. daily. 11.Losartan 100 mg p.o. daily. 12.Zofran 4 mg p.o. every 8 hours p.r.n. nausea. 13.Reglan 5 mg p.o. before breakfast and supper  p.r.n. nausea. 14.Demadex 1 tablet daily as needed for leg swelling.  ALLERGIES:  SULFA DRUGS.  PAST SURGICAL HISTORY:  1996 total abdominal hysterectomy, February 2011 colonoscopy that showed few diverticuli and small internal hemorrhoids, and October 18, 2010, right total knee replacement.  PAST MEDICAL HISTORY: 1. Hypertension. 2. Hyperlipidemia. 3. Restless legs syndrome and chronic poor quality sleep. 4. Depression. 5. Bilateral knee osteoarthritis. 6. Cervical spine degenerative disk disease and osteoarthritis seen in     January 2011 CT scan of the neck. 7. Vitamin B12 deficiency with February 2011 initial vitamin B12 level     less than 150, 2011 diagnosis of severe vitamin D level with     initial 25-hydroxy vitamin D level of less than 4.0. 8. Frequent falls.  PHYSICIANS INVOLVED IN CARE:  Noralyn Pick. Eden Emms, MD, [facility]; Pollyann Savoy, M.D.; Anselmo Rod, MD, [facility]; Roylene Reason. Alton Revere, M.D. [facility]).  FAMILY HISTORY:  Father died from diabetes mellitus related kidney failure.  Mother died from a stroke.  A daughter is age 64 years and is healthy.  SOCIAL HISTORY:  She is married to her husband, who is retired.  She is a nonsmoker and does not drink alcohol.  INITIAL PHYSICAL EXAMINATION:  VITAL  SIGNS:  Blood pressure 142/88, pulse 72, respirations 20, temperature 97.3 degrees Fahrenheit, weight 187 pounds. GENERAL:  She is a mildly overweight black woman who looked fatigued and was holding her head because of nausea. HEAD, EYES, EARS, NOSE, AND THROAT:  Exam was within normal limits. Pupils were equal, round and reactive to light and accommodation. NECK:  Supple without jugular venous distention or carotid bruit. CHEST:  Clear to auscultation. HEART:  Had a regular rate and rhythm and was without significant murmur or gallop.  Pedal pulses were normal.  There was bilateral trace leg edema. ABDOMEN:  Had normal bowel sounds, which were somewhat  reduced, but present and there was no hepatosplenomegaly or tenderness or hernia. MUSCULOSKELETAL:  She walks slowly with a wide-based gait using a single prong cane with close standby assistance.  NEUROLOGIC:  She is alert and well-oriented with a normal affect.  Cranial nerves II-XII were intact. Reflexes were normal, light touch sensation was normal, speech was normal, and finger-to-nose testing was normal.  INITIAL LABORATORY STUDIES:  Serum sodium 139, potassium 2.7, chloride 98, carbon dioxide 29, glucose 147, BUN 26, creatinine 0.77 with estimated GFR greater than 60, total bilirubin 0.7, alkaline phosphatase 55, SGOT 10, SGPT 9, total protein 7.4, albumin 3.9, calcium 10.3.  HOSPITAL COURSE:  The patient was admitted to a medical bed without telemetry.  She was treated with moderate dose IV fluids and initially with round-the-clock Zofran.  She was also treated with medications to relieve her constipation, which were successful on the night of admission.  Her blood pressure medications were adjusted during her hospitalization.  Her diabetes was treated with sliding scale insulin with good control of her blood glucose levels.  She had several diagnostic studies during admission including an abdomen ultrasound exam that showed a small amount of layering biliary sludge within the gallbladder lumen, but no gallbladder wall thickening or pericholecystic fluid with normal intra and extrahepatic biliary ducts. She also had a CT scan of the abdomen and pelvis done with oral and IV contrast that showed no acute abdomen process with a 2 cm nonspecific left adrenal nodule, presumably benign in the absence of a history of primary carcinoma.  A followup scan in 1 year was recommended to confirm stability.  Her condition rapidly improved with normalization of her intravascular fluid volume and also normalization of her electrolytes.  COMPLICATIONS:  None.  CONDITION ON DISCHARGE:  On  the morning of discharge, she had one of her recurrent migraine headaches that was moderately severe (9/10 in intensity).  This was not associated with vision changes or nausea or vomiting.  She subsequently went on to have a noncontrast head CT scan on the day of discharge that was normal.  DISCHARGE PHYSICAL EXAMINATION:  VITAL SIGNS:  At the time of my evaluation, her vital signs include a blood pressure of 178/79, pulse 58, respirations 18, temperature 97.9, pulse oxygen saturation 96% on room air. GENERAL:  She is an elderly black woman who is in no apparent distress. HEAD, EYES, EARS, NOSE, AND THROAT:  Within normal limits. NECK:  Supple without jugular venous distention or carotid bruit. CHEST:  Clear to auscultation. HEART:  Had a regular rate and rhythm.  S1 and S2 were present without murmur or gallop. ABDOMEN:  Had normal bowel sounds and no tenderness. EXTREMITIES:  Without cyanosis, clubbing, or edema. NEUROLOGICAL:  She is alert and oriented x4.  Cranial nerves II-XII were significant for mildly decreased hearing bilaterally.  Her strength in all  4 limbs was normal.  She had normal finger-to-nose testing.  She was able to ambulate with her cane in the room independently.  PROCEDURES:  Abdomen ultrasound exam, CT scan of the abdomen and pelvis with IV contrast and a CT scan of the head without IV contrast.  DISCHARGE DIAGNOSES: 1. Nausea and vomiting, resolved. 2. Severe hypokalemia from torsemide, resolved. 3. Common migraine headache. 4. Hypertension, essential, labile. 5. History of medication noncompliance. 6. Constipation. 7. Diabetes mellitus type 2 with microalbuminuria. 8. Hyperlipidemia. 9. Restless legs syndrome. 10.History of depression. 11.Bilateral knee osteoarthritis. 12.Cervical spine osteoarthritis. 13.Vitamin B12 deficiency. 14.Vitamin D deficiency. 15.Gait instability. 16.Dehydration, resolved.  MOST RECENT LABORATORY STUDIES:  Serum sodium  141, potassium 3.6, chloride 107, carbon dioxide 25, BUN 21, creatinine 0.61, glucose 180. White blood cells 6.2, hemoglobin 12, hematocrit 37, and platelets 226.  DISCHARGE MEDICATIONS: 1. Naprosyn 500 mg p.o. twice daily as needed for moderately severe     pain or headache. 2. Phenergan 25 mg suppositories three times daily as needed for     nausea. 3. Metformin extended release 500 mg 1 tablet p.o. twice daily. 4. Acetaminophen 325 mg take 2 tablets p.o. daily p.r.n. mild to     moderate pain. 5. Alprazolam 0.5 mg p.o. twice daily. 6. Aspirin 325 mg p.o. daily. 7. Glipizide extended release 5 mg p.o. daily. 8. Vicodin 5/500 1 tablet p.o. every 8 hours as needed for moderate to     severe pain. 9. Losartan 100 mg p.o. daily. 10.Toprol-XL 100 mg p.o. daily. 11.Pramipexole 0.25 mg p.o. t.i.d. 12.Verapamil slow release 240 mg p.o. twice daily. 13.Vitamin B12 over-the-counter 1 tablet p.o. daily. 14.Vitamin D2 50,000 units p.o. once monthly.  DISPOSITION AND FOLLOWUP:  The patient will be discharged to home in the company of her daughter.  She was advised to schedule a followup visit with Dr. Jarome Matin at [facility] by calling [phone] with a visit planned in approximately 1 week from discharge.  She was advised to call Dr. Eloise Harman should her symptoms worsen in the interim.    ______________________________ Barry Dienes Eloise Harman, M.D.     DGP/MEDQ  D:  09/23/2010  T:  09/24/2010  Job:  [id number]  Electronically Signed by Jarome Matin M.D. on 10/06/2010 08:42:46 AM

## 2010-10-07 ENCOUNTER — Other Ambulatory Visit: Payer: Self-pay | Admitting: Orthopedic Surgery

## 2010-10-07 ENCOUNTER — Encounter (HOSPITAL_COMMUNITY): Payer: Medicare Other

## 2010-10-07 LAB — CBC
HCT: 37.8 % (ref 36.0–46.0)
Hemoglobin: 12.6 g/dL (ref 12.0–15.0)
MCH: 27.6 pg (ref 26.0–34.0)
MCHC: 33.3 g/dL (ref 30.0–36.0)
MCV: 82.9 fL (ref 78.0–100.0)
RBC: 4.56 MIL/uL (ref 3.87–5.11)

## 2010-10-07 LAB — URINALYSIS, ROUTINE W REFLEX MICROSCOPIC
Bilirubin Urine: NEGATIVE
Glucose, UA: NEGATIVE mg/dL
Ketones, ur: NEGATIVE mg/dL
Protein, ur: NEGATIVE mg/dL
pH: 6.5 (ref 5.0–8.0)

## 2010-10-07 LAB — SURGICAL PCR SCREEN: Staphylococcus aureus: NEGATIVE

## 2010-10-07 LAB — COMPREHENSIVE METABOLIC PANEL
ALT: 10 U/L (ref 0–35)
Albumin: 3.9 g/dL (ref 3.5–5.2)
Alkaline Phosphatase: 58 U/L (ref 39–117)
BUN: 15 mg/dL (ref 6–23)
Chloride: 105 mEq/L (ref 96–112)
GFR calc Af Amer: >60 mL/min (ref >60)
Glucose, Bld: 129 mg/dL — ABNORMAL HIGH (ref 70–99)
Potassium: 4.3 mEq/L (ref 3.5–5.1)
Sodium: 141 mEq/L (ref 135–145)
Total Bilirubin: 0.7 mg/dL (ref 0.3–1.2)
Total Protein: 7.2 g/dL (ref 6.0–8.3)

## 2010-10-07 LAB — URINE MICROSCOPIC-ADD ON

## 2010-10-18 ENCOUNTER — Inpatient Hospital Stay (HOSPITAL_COMMUNITY)
Admission: RE | Admit: 2010-10-18 | Discharge: 2010-10-21 | DRG: 470 | Disposition: A | Discharge status: Skilled Nursing Facility | Payer: Medicare Other | Source: Ambulatory Visit | Attending: Orthopedic Surgery | Admitting: Orthopedic Surgery

## 2010-10-18 DIAGNOSIS — Z01812 Encounter for preprocedural laboratory examination: Secondary | ICD-10-CM

## 2010-10-18 DIAGNOSIS — M1710 Unilateral primary osteoarthritis, unspecified knee: Principal | ICD-10-CM | POA: Diagnosis present

## 2010-10-18 DIAGNOSIS — Z780 Asymptomatic menopausal state: Secondary | ICD-10-CM

## 2010-10-18 DIAGNOSIS — E119 Type 2 diabetes mellitus without complications: Secondary | ICD-10-CM | POA: Diagnosis present

## 2010-10-18 DIAGNOSIS — E876 Hypokalemia: Secondary | ICD-10-CM | POA: Diagnosis not present

## 2010-10-18 DIAGNOSIS — I10 Essential (primary) hypertension: Secondary | ICD-10-CM | POA: Diagnosis present

## 2010-10-18 DIAGNOSIS — Z79899 Other long term (current) drug therapy: Secondary | ICD-10-CM

## 2010-10-18 DIAGNOSIS — Z7982 Long term (current) use of aspirin: Secondary | ICD-10-CM

## 2010-10-18 DIAGNOSIS — F3289 Other specified depressive episodes: Secondary | ICD-10-CM | POA: Diagnosis present

## 2010-10-18 DIAGNOSIS — F329 Major depressive disorder, single episode, unspecified: Secondary | ICD-10-CM | POA: Diagnosis present

## 2010-10-18 DIAGNOSIS — G2581 Restless legs syndrome: Secondary | ICD-10-CM | POA: Diagnosis present

## 2010-10-18 DIAGNOSIS — M21069 Valgus deformity, not elsewhere classified, unspecified knee: Secondary | ICD-10-CM | POA: Diagnosis present

## 2010-10-18 DIAGNOSIS — E538 Deficiency of other specified B group vitamins: Secondary | ICD-10-CM | POA: Diagnosis present

## 2010-10-18 DIAGNOSIS — R51 Headache: Secondary | ICD-10-CM | POA: Diagnosis present

## 2010-10-18 DIAGNOSIS — E559 Vitamin D deficiency, unspecified: Secondary | ICD-10-CM | POA: Diagnosis present

## 2010-10-18 DIAGNOSIS — D62 Acute posthemorrhagic anemia: Secondary | ICD-10-CM | POA: Diagnosis not present

## 2010-10-18 DIAGNOSIS — M5030 Other cervical disc degeneration, unspecified cervical region: Secondary | ICD-10-CM | POA: Diagnosis present

## 2010-10-18 DIAGNOSIS — E785 Hyperlipidemia, unspecified: Secondary | ICD-10-CM | POA: Diagnosis present

## 2010-10-18 LAB — GLUCOSE, CAPILLARY
Glucose-Capillary: 76 mg/dL (ref 70–99)
Glucose-Capillary: 127 mg/dL — ABNORMAL HIGH (ref 70–99)

## 2010-10-19 LAB — GLUCOSE, CAPILLARY
Glucose-Capillary: 162 mg/dL — ABNORMAL HIGH (ref 70–99)
Glucose-Capillary: 166 mg/dL — ABNORMAL HIGH (ref 70–99)
Glucose-Capillary: 186 mg/dL — ABNORMAL HIGH (ref 70–99)

## 2010-10-19 LAB — BASIC METABOLIC PANEL
CO2: 26 mEq/L (ref 19–32)
Chloride: 103 mEq/L (ref 96–112)
Creatinine, Ser: 0.59 mg/dL (ref 0.50–1.10)
GFR calc Af Amer: >60 mL/min (ref >60)
Potassium: 3.1 mEq/L — ABNORMAL LOW (ref 3.5–5.1)
Sodium: 137 mEq/L (ref 135–145)

## 2010-10-19 LAB — CBC
MCV: 81.7 fL (ref 78.0–100.0)
Platelets: 183 10*3/uL (ref 150–400)
RBC: 3.45 MIL/uL — ABNORMAL LOW (ref 3.87–5.11)
RDW: 14.4 % (ref 11.5–15.5)
WBC: 9.5 10*3/uL (ref 4.0–10.5)

## 2010-10-20 LAB — CBC
HCT: 23 % — ABNORMAL LOW (ref 36.0–46.0)
Hemoglobin: 7.7 g/dL — ABNORMAL LOW (ref 12.0–15.0)
MCHC: 33.5 g/dL (ref 30.0–36.0)
MCV: 82.7 fL (ref 78.0–100.0)

## 2010-10-20 LAB — BASIC METABOLIC PANEL
BUN: 17 mg/dL (ref 6–23)
Chloride: 104 mEq/L (ref 96–112)
Creatinine, Ser: 0.74 mg/dL (ref 0.50–1.10)
Glucose, Bld: 195 mg/dL — ABNORMAL HIGH (ref 70–99)
Potassium: 3.9 mEq/L (ref 3.5–5.1)

## 2010-10-20 LAB — GLUCOSE, CAPILLARY: Glucose-Capillary: 145 mg/dL — ABNORMAL HIGH (ref 70–99)

## 2010-10-21 LAB — TYPE AND SCREEN
ABO/RH(D): O POS
Antibody Screen: NEGATIVE
Unit division: 0

## 2010-10-21 LAB — GLUCOSE, CAPILLARY: Glucose-Capillary: 209 mg/dL — ABNORMAL HIGH (ref 70–99)

## 2010-10-21 LAB — BASIC METABOLIC PANEL
Chloride: 107 mEq/L (ref 96–112)
GFR calc Af Amer: >60 mL/min (ref >60)
GFR calc non Af Amer: >60 mL/min (ref >60)
Potassium: 4.5 mEq/L (ref 3.5–5.1)
Sodium: 139 mEq/L (ref 135–145)

## 2010-10-21 LAB — CBC
HCT: 26.3 % — ABNORMAL LOW (ref 36.0–46.0)
Hemoglobin: 8.9 g/dL — ABNORMAL LOW (ref 12.0–15.0)
RBC: 3.15 MIL/uL — ABNORMAL LOW (ref 3.87–5.11)

## 2010-10-21 NOTE — H&P (Signed)
Michelle, [name]             ACCOUNT NO.:  [id number]  MEDICAL RECORD NO.:  [id number]  LOCATION:                                 FACILITY:  PHYSICIAN:  Ollen Gross, M.D.    DATE OF BIRTH:  08-01-1940  DATE OF ADMISSION:  10/18/2010 DATE OF DISCHARGE:                             HISTORY & PHYSICAL   CHIEF COMPLAINT:  Right knee pain.  HISTORY OF PRESENT ILLNESS:  The patient is a 70 year old female who has been seen by Dr. Lequita Halt for ongoing right knee pain.  She has known end- stage arthritis with bone-on-bone arthritis found.  She has severe functioning limitations due to the progressive nature of her pain, which is progressively gotten worse.  She is also has undergone cortisone injections and only had felt minimal benefit.  She has failed nonoperative management, now felt to be a candidate for surgery.  She has been seen by Dr. Eloise Harman preoperatively and felt to be stable for upcoming procedure.  ALLERGIES:  SULFA causes swelling.  CURRENT MEDICATIONS:  Verapamil, Lopressor, aspirin, glipizide, potassium, vitamin B12, vitamin D.  PAST MEDICAL HISTORY: 1. Headaches. 2. Restless legs syndrome. 3. Vitamin B12 deficiency. 4. Vitamin D deficiency. 5. History of depression. 6. Hypertension. 7. Hyperlipidemia. 8. Diabetes mellitus. 9. Cervical degenerative disk disease. 10.Postmenopausal. 11.Childhood illnesses of measles and mumps. 12.Recent hospitalization for electrolyte imbalance/hypokalemia.  PAST SURGICAL HISTORY:  Hysterectomy.  FAMILY HISTORY:  Father with kidney failure.  Mother with stroke.  SOCIAL HISTORY:  Married, nonsmoker.  No alcohol.  She has 1 child.  She does want to look into a rehab center postoperatively.  She would like to look into [facility] or [facility]  REVIEW OF SYSTEMS:  GENERAL:  No fever, chills, or night sweats.  NEURO: No Seizure, syncope, or paralysis.  RESPIRATORY:  No shortness of breath, productive cough, or  hemoptysis.  CARDIOVASCULAR:  No chest pain, angina, orthopnea.  GI:  No nausea, vomiting, diarrhea, or constipation.  GU:  No dysuria, hematuria, or discharge. MUSCULOSKELETAL:  Knee pain.  PHYSICAL EXAMINATION:  VITAL SIGNS:  Pulse 92, respirations 12, blood pressure 158/88. GENERAL:  A 70 year old [place] American female, well nourished, well developed, tall frame, alert, oriented, cooperative, pleasant.  Good historian.  She is accompanied by her husband. HEENT:  Normocephalic, atraumatic.  Pupils are round and reactive.  EOMs intact. NECK:  Supple.  No carotid bruits. CHEST:  Clear. HEART:  Regular rate and rhythm without murmur.  S1, S2 noted. ABDOMEN:  Soft, nontender.  Bowel sounds present. RECTAL/BREASTS/GENITALIA:  Not done, not pertinent to present illness. EXTREMITIES:  Right knee significant valgus malalignment deformity, range of motion 5-125, marked crepitus tender more lateral than medial, no instability.  IMPRESSION:  Osteoarthritis, right knee.  PLAN:  The patient admitted to [facility] to undergo a right total knee replacement arthroplasty.  Surgery will be performed by Dr. Ollen Gross.     Alexzandrew L. Julien Girt, P.A.C.   ______________________________ Ollen Gross, M.D.    ALP/MEDQ  D:  10/17/2010  T:  10/18/2010  Job:  [id number]  cc:   Barry Dienes. Eloise Harman, M.D. Fax: [phone]  Electronically Signed by Patrica Duel P.A.C. on 10/18/2010  04:09:33 PM Electronically Signed by Ollen Gross M.D. on 10/21/2010 04:06:10 PM

## 2010-10-21 NOTE — Op Note (Signed)
[name], [name]             ACCOUNT NO.:  [id number]  MEDICAL RECORD NO.:  [id number]  LOCATION:  1613                         FACILITY:  [facility]  PHYSICIAN:  Ollen Gross, M.D.    DATE OF BIRTH:  1940/05/24  DATE OF PROCEDURE:  10/18/2010 DATE OF DISCHARGE:                              OPERATIVE REPORT   PREOPERATIVE DIAGNOSIS:  Osteoarthritis right knee with valgus deformity.  POSTOPERATIVE DIAGNOSIS:  Osteoarthritis right knee with valgus deformity.  PROCEDURE:  Right total knee arthroplasty.  SURGEON:  Ollen Gross, MD  ASSISTANT:  Alexzandrew L. Perkins, PA-C  ANESTHESIA:  Spinal.  ESTIMATED BLOOD LOSS:  Minimal.  DRAIN:  Hemovac x1.  TOURNIQUET TIME:  36 minutes at 300 mmHg.  COMPLICATIONS:  None.  CONDITION:  Stable to Recovery.  BRIEF CLINICAL NOTE:  [name] is a 70 year old female with advanced severe osteoarthritis of the right knee, greater than left knee, with progressively worsening pain and dysfunction.  She has failed nonoperative management, including analgesic injections and protected weightbearing.  She has a large 15-20 degrees valgus deformity with bone on bone change in the lateral and patellofemoral compartments.  She presents now for total knee arthroplasty secondary to failure of nonoperative management.  PROCEDURE IN DETAIL:  After successful administration of spinal anesthetic, a tourniquet was placed high on her right thigh, and her right lower extremity was prepped and draped in usual sterile fashion. Extremity was wrapped in an Esmarch, knee flexed, tourniquet inflated to 300 mmHg.  Midline incision was made with a 10 blade through subcutaneous tissue to the level of the extensor mechanism.  A fresh blade was used to make a lateral parapatellar arthrotomy and soft tissue over the proximal lateral tibia subperiosteally elevated around the joint line to the semimembranosus bursa, but not including the structures of the  semimembranosus bursa.  I went lateral because of her valgus deformity.  Patella was then everted medially, knee flexed 90 degrees, and ACL and PCL removed.  Drill was used to create a starting hole in the distal femur.  Canal was thoroughly irrigated.  A 5-degree right valgus alignment guide was placed.  Distal femoral cutting block was pinned to remove 10 mm off the distal femur.  Distal femoral resection was then made with an oscillating saw.  Sizing guide was placed and size 4 was most appropriate.  The rotation was marked off the epicondylar axis.  Size 4 cutting block was placed and the anterior and posterior chamfer cuts made.  The tibia subluxed forward and the menisci removed.  Extramedullary tibial alignment guide was placed referencing proximally at the medial aspect of tibial tubercle and distally along the 2nd metatarsal axis and tibial crest.  Block was pinned to remove 2 mm off the more deficient lateral side.  Tibial resection was made with an oscillating saw. Sizing guide was placed and size 3 was most appropriate.  The proximal tibia was then prepared with a modular drill and keel punched for the size 3.  Femoral preparation was completed with the intercondylar cut for the size 4.  Size 3 mobile-bearing tibial trial, size 4 posterior stabilized femoral trial, and a 10 mm posterior stabilized rotating platform insert trial were  placed.  With the 10, full extension was achieved with excellent varus-valgus, anterior-posterior balance, throughout full range of motion.  Patella was everted and thickness measured to be 22 mm. Freehand resection was taken to 12 mm, 38 template was placed, lug holes were drilled, trial patella was placed and it tracked normally. Osteophytes removed off the posterior femur with the trial in place. All trials were removed and the cut bone surfaces were prepared with pulsatile lavage.  Cement was mixed and once ready for implantation, the size 3  mobile-bearing tibial tray, size 4 posterior stabilized femur, and the 38 patella were cemented into place and the patella was held with a clamp.  Trial 10 mm insert was placed, knee held in full extension, and all extruded cement removed.  When the cement was fully hardened, then the permanent 10 mm posterior stabilized rotating platform insert was placed into the tibial tray.  The wound was copiously irrigated with saline solution and the tourniquet released for a total time of 36 minutes.  Minimal bleeding was encountered.  A lateral arthrotomy was then closed with interrupted #1 PDS leaving open a small area from the superior and inferior pole of the patella to serve as a mini release.  Flexion against gravity was 135 degrees.  Patella tracked normally.  Subcutaneous was closed with interrupted 2-0 Vicryl and subcuticular with running 4-0 Monocryl.  The incision was cleaned and dried and catheter for Marcaine pain pump was placed.  Steri-Strips and a bulky sterile dressing were applied.  She was then placed into a knee immobilizer, awakened, and transported to Recovery in stable condition.     Ollen Gross, M.D. FA/MEDQ  D:  10/18/2010  T:  10/19/2010  Job:  [id number]  Electronically Signed by Ollen Gross M.D. on 10/21/2010 04:06:15 PM

## 2010-10-22 LAB — GLUCOSE, CAPILLARY
Glucose-Capillary: 194 mg/dL — ABNORMAL HIGH (ref 70–99)
Glucose-Capillary: 278 mg/dL — ABNORMAL HIGH (ref 70–99)

## 2010-11-03 NOTE — Discharge Summary (Signed)
[name], [name]             ACCOUNT NO.:  [id number]  MEDICAL RECORD NO.:  [id number]  LOCATION:  1613                         FACILITY:  [facility]  PHYSICIAN:  Ollen Gross, M.D.    DATE OF BIRTH:  May 06, 1940  DATE OF ADMISSION:  10/18/2010 DATE OF DISCHARGE:  10/21/2010                        DISCHARGE SUMMARY - REFERRING   ADMITTING DIAGNOSES: 1. Osteoarthritis, right knee. 2. Headaches. 3. Restless legs syndrome. 4. Vitamin B12 deficiency. 5. Vitamin D deficiency. 6. History of depression. 7. Hypertension. 8. Hyperlipidemia. 9. Diabetes mellitus. 10.Cervical degenerative disk disease. 11.Postmenopausal. 12.Recent hospitalization for electrolyte imbalance/hypokalemia. 13.Childhood illnesses measles and mumps.  DISCHARGE DIAGNOSES: 1. Osteoarthritis right knee with valgus malalignment deformity status     post right total knee arthroplasty. 2. Postoperative hypokalemia, improved. 3. Postoperative acute blood loss anemia. 4. Status post transfusion without sequelae. 5. Osteoarthritis, right knee. 6. Headaches. 7. Restless legs syndrome. 8. Vitamin B12 deficiency. 9. Vitamin D deficiency. 10.History of depression. 11.Hypertension. 12.Hyperlipidemia. 13.Diabetes mellitus. 14.Cervical degenerative disk disease. 15.Postmenopausal. 16.Recent hospitalization for electrolyte imbalance/hypokalemia. 17.Childhood illnesses measles and mumps.  PROCEDURE:  On October 18, 2010, right total knee.  SURGEON:  Ollen Gross, M.D.  ASSISTANT:  Alexzandrew L. Perkins, P.A.C.  ANESTHESIA:  Spinal.  TOURNIQUET TIME:  A 36 minutes.  CONSULTATIONS:  None.  BRIEF HISTORY:  [name] is a 70 year old female with advanced arthritis to the right knee greater than left knee, progressive worsening pain and dysfunction, failed nonoperative management including analgesics injections and protective weightbearing.  She has a large 15 to 20 degrees valgus deformity with  bone-on-bone changes in the lateral patellofemoral compartments and who now presents for a total knee arthroplasty.  LABORATORY DATA:  Preoperative CBC showed a hemoglobin of 12.6, hematocrit 37.8, white cell count 6.4, and platelets 248.  PT/INR 13.5 and 1.01 with a PTT of 35.  Chemistry panel on admission slightly elevated glucose of 129.  Remaining chemistry panel all within normal limits.  Preoperative UA small leukocytes, few squamous, 36 white cells, and few bacteria.  Blood group type O+.  Nasal swabs were negative for Staphylococcus aureus, negative for MRSA.  Serial CBCs were followed. Hemoglobin dropped down to 9.5, got as low as 7.7 given 2 units of blood back up to 8.9.  Last noted hemoglobin and hematocrit was 8.9 and 26.3. Serial BMETs were followed for 3 days.  Potassium dropped to 4.3 down to 3.1 back up to 3.9 last noted at 4.5.  Remaining electrolytes remained stable throughout the hospital course.  HOSPITAL COURSE:  The patient admitted to [facility], taken to OR, underwent above-stated procedure without complication.  The patient tolerated the procedure well, later transferred to recovery room orthopedic floor.  She was started on p.o. and IV analgesic pain control following surgery, started back on her home medications.  Hemoglobin is down a little bit down to 9.5.  She is asymptomatic with this.  She did have some nausea and vomiting on the morning of surgery and she did more look into skilled facility after the hospital stay.  She was given antiemetics and we stopped the IV narcotics.  Her urine output was a little low on day one and also she had the nausea  and vomiting so she was supported with fluids.  Her blood pressure medications were started back, started on parameters to avoid any kind of hypotensive episodes. We started back on her home medications with the exception of the metformin we held that temporarily and postoperatively to make sure  her diet was progressing and she had good renal function.  She did receive fluids on day 1 and by day 2 the nausea and vomiting was much better. Hemoglobin down to 7.7 though she had low potassium on day 1 and we put her on some oral potassium supplements and it was already back up to a normal level on day 2 and we rechecked her labs.  Her pressure was stable, but due to the low hemoglobin and the fact that we knew she would benefit from undergoing transfusion for her therapy she did receive 2 units of blood.  She tolerated the blood well and hemoglobin responded appropriately.  We changed her dressing on day 2 and the incision was healing well and no signs of infection.  She was slowly progressive to physical therapy only walking about 5 feet on day 1 and slowly progressing by day 2  and it was felt she would be a good candidate for inpatient rehab to maximize her therapy mobility prior to going home.  We are waiting on bed availability.  She wanted to [facility].  We were waiting on final bed off. She was seen on morning of day 3.  Hemoglobin was back up to 8.9.  The nausea had resolved and we are waiting on the final bed offer.  Her electrolytes looked good and it was decided she would be transfer at bedtime and also bed was available.  DISCHARGE PLAN: 1. The patient will be transferred to [facility] today     on October 21, 2010, once bed available. 2. Discharge diagnosis please see above. 3. Discharge medications  CURRENT MEDICATIONS AT TIME OF TRANSFER: 1. Losartan 100 mg p.o. q.a.m. 2. Toprol-XL 100 mg p.o. daily. 3. Verapamil SR 240 mg p.o. twice a day. 4. Mirapex 0.25 mg 3 times a day. 5. Xarelto 10 mg p.o. daily for 3 weeks and then discontinue the     Xarelto. 6. Colace 100 mg p.o. b.i.d. 7. Glucotrol XL 5 mg p.o. daily. 8. Tylenol 325 one or two every 4 to 6 hours as needed for mild pain,     temperature, or headache. 9.  Xanax 0.5 mg twice a day p.r.n. anxiety. 10.Robaxin 500 mg p.o. q.6-8 h. p.r.n. spasm. 11.OxyIR 5 mg 1 or 2 every 4 to 6 hours as needed for moderate pain. 12.Promethazine rectal suppository 25 mg per rectum up to 3 times a     day as needed for nausea and vomiting. 13.Metformin 500 mg twice a day.  DIET:  Diabetic diet and heart-healthy diet.  ACTIVITY:  She is weightbearing as tolerated, total knee protocol to the right lower extremity.  PT and OT for gait training, ambulation, ADLs, range of motion, and strengthening exercises for total knee protocol. Please note that the patient may start showering once transferred to [facility], do not submerge incision under water and daily dressing change to the knee incision.  FOLLOWUP:  She needs followup with Dr. Lequita Halt in the office 2 weeks from date of surgery.  Followup on either Tuesday September 11th or Thursday September 13th.  Please contact the office at [phone] to help the patient arrange appointment followup  and transportation.  DISPOSITION:  Plan is to go to [facility].  CONDITION UPON DISCHARGE:  Improving at the time of dictation, waiting on final bed approval and offer.     Alexzandrew L. Julien Girt, P.A.C.   ______________________________Frank Lequita Halt, M.D.    ALP/MEDQ  D:  10/21/2010  T:  10/21/2010  Job:  [id number]  cc:   Barry Dienes. Eloise Harman, M.D. Fax: [phone]  Electronically Signed by Ollen Gross M.D. on 11/03/2010 04:10:34 PM

## 2010-11-27 ENCOUNTER — Emergency Department (HOSPITAL_COMMUNITY): Payer: Medicare Other

## 2010-11-27 ENCOUNTER — Emergency Department (HOSPITAL_COMMUNITY)
Admission: EM | Admit: 2010-11-27 | Discharge: 2010-11-27 | Disposition: A | Discharge status: Home / Self Care | Payer: Medicare Other | Attending: Emergency Medicine | Admitting: Emergency Medicine

## 2010-11-27 DIAGNOSIS — W19XXXA Unspecified fall, initial encounter: Secondary | ICD-10-CM | POA: Insufficient documentation

## 2010-11-27 DIAGNOSIS — I10 Essential (primary) hypertension: Secondary | ICD-10-CM | POA: Insufficient documentation

## 2010-11-27 DIAGNOSIS — Z96659 Presence of unspecified artificial knee joint: Secondary | ICD-10-CM | POA: Insufficient documentation

## 2010-11-27 DIAGNOSIS — S52599A Other fractures of lower end of unspecified radius, initial encounter for closed fracture: Secondary | ICD-10-CM | POA: Insufficient documentation

## 2010-11-27 DIAGNOSIS — E119 Type 2 diabetes mellitus without complications: Secondary | ICD-10-CM | POA: Insufficient documentation

## 2010-11-27 DIAGNOSIS — M25539 Pain in unspecified wrist: Secondary | ICD-10-CM | POA: Insufficient documentation

## 2010-12-02 LAB — COMPREHENSIVE METABOLIC PANEL
Alkaline Phosphatase: 46
BUN: 16
Calcium: 8.8
Glucose, Bld: 134 — ABNORMAL HIGH
Total Protein: 6.1

## 2010-12-02 LAB — CBC
HCT: 36.2
Hemoglobin: 12.1
MCHC: 33.4
Platelets: 239
RBC: 4.45
RDW: 14.9 — ABNORMAL HIGH
WBC: 7.3

## 2010-12-02 LAB — I-STAT 8, (EC8 V) (CONVERTED LAB)
Acid-Base Excess: 3 — ABNORMAL HIGH
Chloride: 105
Glucose, Bld: 177 — ABNORMAL HIGH
Hemoglobin: 12.9
Potassium: 2.7 — CL
Sodium: 141
TCO2: 26

## 2010-12-02 LAB — CARDIAC PANEL(CRET KIN+CKTOT+MB+TROPI)
CK, MB: 1.8
Relative Index: 0.7
Troponin I: 0.03

## 2010-12-02 LAB — DIFFERENTIAL
Eosinophils Absolute: 0
Lymphocytes Relative: 17
Lymphs Abs: 1.2
Neutro Abs: 5.6
Neutrophils Relative %: 77

## 2010-12-02 LAB — HEMOGLOBIN A1C: Mean Plasma Glucose: 147

## 2010-12-02 LAB — BASIC METABOLIC PANEL
CO2: 26
Chloride: 102
GFR calc Af Amer: >60
Sodium: 143

## 2010-12-02 LAB — HEPATIC FUNCTION PANEL
ALT: 15
AST: 17
Albumin: 3.8
Alkaline Phosphatase: 46
Total Protein: 6.8

## 2010-12-02 LAB — CK TOTAL AND CKMB (NOT AT ARMC): Total CK: 68

## 2010-12-02 LAB — POCT I-STAT CREATININE: Operator id: 192351

## 2011-04-07 DIAGNOSIS — M1710 Unilateral primary osteoarthritis, unspecified knee: Secondary | ICD-10-CM | POA: Diagnosis not present

## 2011-05-12 DIAGNOSIS — R5381 Other malaise: Secondary | ICD-10-CM | POA: Diagnosis not present

## 2011-05-17 ENCOUNTER — Other Ambulatory Visit: Payer: Self-pay | Admitting: Neurology

## 2011-05-17 DIAGNOSIS — R531 Weakness: Secondary | ICD-10-CM

## 2011-05-23 ENCOUNTER — Ambulatory Visit
Admission: RE | Admit: 2011-05-23 | Discharge: 2011-05-23 | Disposition: A | Discharge status: Home / Self Care | Payer: Medicare Other | Source: Ambulatory Visit | Attending: Neurology | Admitting: Neurology

## 2011-05-23 DIAGNOSIS — R29898 Other symptoms and signs involving the musculoskeletal system: Secondary | ICD-10-CM | POA: Diagnosis not present

## 2011-05-23 DIAGNOSIS — R531 Weakness: Secondary | ICD-10-CM

## 2011-05-23 MED ORDER — GADOBENATE DIMEGLUMINE 529 MG/ML IV SOLN
18.0000 mL | Freq: Once | INTRAVENOUS | Status: AC | PRN
  Administered 2011-05-23: 18 mL via INTRAVENOUS

## 2011-05-26 DIAGNOSIS — R269 Unspecified abnormalities of gait and mobility: Secondary | ICD-10-CM | POA: Diagnosis not present

## 2011-06-07 DIAGNOSIS — R269 Unspecified abnormalities of gait and mobility: Secondary | ICD-10-CM | POA: Diagnosis not present

## 2011-06-07 DIAGNOSIS — E119 Type 2 diabetes mellitus without complications: Secondary | ICD-10-CM | POA: Diagnosis not present

## 2011-06-07 DIAGNOSIS — R5383 Other fatigue: Secondary | ICD-10-CM | POA: Diagnosis not present

## 2011-06-07 DIAGNOSIS — R5381 Other malaise: Secondary | ICD-10-CM | POA: Diagnosis not present

## 2011-06-07 DIAGNOSIS — I10 Essential (primary) hypertension: Secondary | ICD-10-CM | POA: Diagnosis not present

## 2011-06-09 DIAGNOSIS — E119 Type 2 diabetes mellitus without complications: Secondary | ICD-10-CM | POA: Diagnosis not present

## 2011-06-09 DIAGNOSIS — I10 Essential (primary) hypertension: Secondary | ICD-10-CM | POA: Diagnosis not present

## 2011-06-09 DIAGNOSIS — R269 Unspecified abnormalities of gait and mobility: Secondary | ICD-10-CM | POA: Diagnosis not present

## 2011-06-13 ENCOUNTER — Other Ambulatory Visit: Payer: Self-pay | Admitting: Neurology

## 2011-06-13 DIAGNOSIS — R269 Unspecified abnormalities of gait and mobility: Secondary | ICD-10-CM

## 2011-06-13 DIAGNOSIS — E119 Type 2 diabetes mellitus without complications: Secondary | ICD-10-CM

## 2011-06-13 DIAGNOSIS — R5381 Other malaise: Secondary | ICD-10-CM

## 2011-06-13 DIAGNOSIS — I10 Essential (primary) hypertension: Secondary | ICD-10-CM

## 2011-06-16 DIAGNOSIS — R269 Unspecified abnormalities of gait and mobility: Secondary | ICD-10-CM | POA: Diagnosis not present

## 2011-06-18 ENCOUNTER — Other Ambulatory Visit: Payer: Medicare Other

## 2011-06-21 DIAGNOSIS — R269 Unspecified abnormalities of gait and mobility: Secondary | ICD-10-CM | POA: Diagnosis not present

## 2011-06-23 ENCOUNTER — Inpatient Hospital Stay: Admission: RE | Admit: 2011-06-23 | Payer: Medicare Other | Source: Ambulatory Visit

## 2011-06-23 DIAGNOSIS — R269 Unspecified abnormalities of gait and mobility: Secondary | ICD-10-CM | POA: Diagnosis not present

## 2011-06-28 DIAGNOSIS — R269 Unspecified abnormalities of gait and mobility: Secondary | ICD-10-CM | POA: Diagnosis not present

## 2011-06-30 DIAGNOSIS — R269 Unspecified abnormalities of gait and mobility: Secondary | ICD-10-CM | POA: Diagnosis not present

## 2011-07-05 DIAGNOSIS — R269 Unspecified abnormalities of gait and mobility: Secondary | ICD-10-CM | POA: Diagnosis not present

## 2011-07-07 DIAGNOSIS — R269 Unspecified abnormalities of gait and mobility: Secondary | ICD-10-CM | POA: Diagnosis not present

## 2011-07-12 DIAGNOSIS — R269 Unspecified abnormalities of gait and mobility: Secondary | ICD-10-CM | POA: Diagnosis not present

## 2011-07-14 DIAGNOSIS — R269 Unspecified abnormalities of gait and mobility: Secondary | ICD-10-CM | POA: Diagnosis not present

## 2011-07-21 DIAGNOSIS — R269 Unspecified abnormalities of gait and mobility: Secondary | ICD-10-CM | POA: Diagnosis not present

## 2011-07-26 DIAGNOSIS — R269 Unspecified abnormalities of gait and mobility: Secondary | ICD-10-CM | POA: Diagnosis not present

## 2011-07-28 DIAGNOSIS — R269 Unspecified abnormalities of gait and mobility: Secondary | ICD-10-CM | POA: Diagnosis not present

## 2011-08-02 DIAGNOSIS — R269 Unspecified abnormalities of gait and mobility: Secondary | ICD-10-CM | POA: Diagnosis not present

## 2011-08-04 DIAGNOSIS — R269 Unspecified abnormalities of gait and mobility: Secondary | ICD-10-CM | POA: Diagnosis not present

## 2011-08-09 DIAGNOSIS — R269 Unspecified abnormalities of gait and mobility: Secondary | ICD-10-CM | POA: Diagnosis not present

## 2011-08-16 DIAGNOSIS — R269 Unspecified abnormalities of gait and mobility: Secondary | ICD-10-CM | POA: Diagnosis not present

## 2011-08-19 DIAGNOSIS — R269 Unspecified abnormalities of gait and mobility: Secondary | ICD-10-CM | POA: Diagnosis not present

## 2011-08-23 DIAGNOSIS — R269 Unspecified abnormalities of gait and mobility: Secondary | ICD-10-CM | POA: Diagnosis not present

## 2011-08-26 DIAGNOSIS — R269 Unspecified abnormalities of gait and mobility: Secondary | ICD-10-CM | POA: Diagnosis not present

## 2011-08-30 DIAGNOSIS — R269 Unspecified abnormalities of gait and mobility: Secondary | ICD-10-CM | POA: Diagnosis not present

## 2011-09-01 DIAGNOSIS — E119 Type 2 diabetes mellitus without complications: Secondary | ICD-10-CM | POA: Diagnosis not present

## 2011-09-01 DIAGNOSIS — H2510 Age-related nuclear cataract, unspecified eye: Secondary | ICD-10-CM | POA: Diagnosis not present

## 2011-09-08 DIAGNOSIS — E119 Type 2 diabetes mellitus without complications: Secondary | ICD-10-CM | POA: Diagnosis not present

## 2011-09-08 DIAGNOSIS — I10 Essential (primary) hypertension: Secondary | ICD-10-CM | POA: Diagnosis not present

## 2011-09-20 DIAGNOSIS — R269 Unspecified abnormalities of gait and mobility: Secondary | ICD-10-CM | POA: Diagnosis not present

## 2011-09-22 DIAGNOSIS — E1159 Type 2 diabetes mellitus with other circulatory complications: Secondary | ICD-10-CM | POA: Diagnosis not present

## 2011-09-22 DIAGNOSIS — I739 Peripheral vascular disease, unspecified: Secondary | ICD-10-CM | POA: Diagnosis not present

## 2011-09-22 DIAGNOSIS — R269 Unspecified abnormalities of gait and mobility: Secondary | ICD-10-CM | POA: Diagnosis not present

## 2011-09-22 DIAGNOSIS — M25569 Pain in unspecified knee: Secondary | ICD-10-CM | POA: Diagnosis not present

## 2011-09-23 DIAGNOSIS — M1710 Unilateral primary osteoarthritis, unspecified knee: Secondary | ICD-10-CM | POA: Diagnosis not present

## 2011-12-21 DIAGNOSIS — E1159 Type 2 diabetes mellitus with other circulatory complications: Secondary | ICD-10-CM | POA: Diagnosis not present

## 2011-12-21 DIAGNOSIS — I10 Essential (primary) hypertension: Secondary | ICD-10-CM | POA: Diagnosis not present

## 2011-12-21 DIAGNOSIS — I739 Peripheral vascular disease, unspecified: Secondary | ICD-10-CM | POA: Diagnosis not present

## 2011-12-21 DIAGNOSIS — R269 Unspecified abnormalities of gait and mobility: Secondary | ICD-10-CM | POA: Diagnosis not present

## 2011-12-21 DIAGNOSIS — Z23 Encounter for immunization: Secondary | ICD-10-CM | POA: Diagnosis not present

## 2012-01-26 ENCOUNTER — Other Ambulatory Visit: Payer: Self-pay | Admitting: Neurology

## 2012-01-26 DIAGNOSIS — E119 Type 2 diabetes mellitus without complications: Secondary | ICD-10-CM | POA: Diagnosis not present

## 2012-01-26 DIAGNOSIS — R269 Unspecified abnormalities of gait and mobility: Secondary | ICD-10-CM | POA: Diagnosis not present

## 2012-01-26 DIAGNOSIS — I10 Essential (primary) hypertension: Secondary | ICD-10-CM | POA: Diagnosis not present

## 2012-01-26 DIAGNOSIS — R5381 Other malaise: Secondary | ICD-10-CM | POA: Diagnosis not present

## 2012-01-26 DIAGNOSIS — E559 Vitamin D deficiency, unspecified: Secondary | ICD-10-CM | POA: Diagnosis not present

## 2012-01-26 DIAGNOSIS — R5383 Other fatigue: Secondary | ICD-10-CM | POA: Diagnosis not present

## 2012-02-06 ENCOUNTER — Ambulatory Visit
Admission: RE | Admit: 2012-02-06 | Discharge: 2012-02-06 | Disposition: A | Discharge status: Home / Self Care | Payer: Medicare Other | Source: Ambulatory Visit | Attending: Neurology | Admitting: Neurology

## 2012-02-06 DIAGNOSIS — I10 Essential (primary) hypertension: Secondary | ICD-10-CM | POA: Diagnosis not present

## 2012-02-06 DIAGNOSIS — E119 Type 2 diabetes mellitus without complications: Secondary | ICD-10-CM | POA: Diagnosis not present

## 2012-02-06 DIAGNOSIS — R269 Unspecified abnormalities of gait and mobility: Secondary | ICD-10-CM

## 2012-02-06 DIAGNOSIS — R5381 Other malaise: Secondary | ICD-10-CM | POA: Diagnosis not present

## 2012-02-06 DIAGNOSIS — R5383 Other fatigue: Secondary | ICD-10-CM

## 2012-03-29 DIAGNOSIS — M48061 Spinal stenosis, lumbar region without neurogenic claudication: Secondary | ICD-10-CM | POA: Diagnosis not present

## 2012-03-29 DIAGNOSIS — M47817 Spondylosis without myelopathy or radiculopathy, lumbosacral region: Secondary | ICD-10-CM | POA: Diagnosis not present

## 2012-04-09 ENCOUNTER — Other Ambulatory Visit: Payer: Self-pay | Admitting: Neurosurgery

## 2012-04-17 ENCOUNTER — Encounter (HOSPITAL_COMMUNITY)
Admission: RE | Admit: 2012-04-17 | Discharge: 2012-04-17 | Disposition: A | Discharge status: Home / Self Care | Payer: Medicare Other | Source: Ambulatory Visit | Attending: Neurosurgery | Admitting: Neurosurgery

## 2012-04-17 ENCOUNTER — Encounter (HOSPITAL_COMMUNITY): Payer: Self-pay | Admitting: Pharmacy Technician

## 2012-04-17 ENCOUNTER — Encounter (HOSPITAL_COMMUNITY): Payer: Self-pay

## 2012-04-17 DIAGNOSIS — IMO0002 Reserved for concepts with insufficient information to code with codable children: Secondary | ICD-10-CM | POA: Diagnosis not present

## 2012-04-17 DIAGNOSIS — M48061 Spinal stenosis, lumbar region without neurogenic claudication: Secondary | ICD-10-CM | POA: Diagnosis not present

## 2012-04-17 DIAGNOSIS — F411 Generalized anxiety disorder: Secondary | ICD-10-CM | POA: Diagnosis present

## 2012-04-17 DIAGNOSIS — M6281 Muscle weakness (generalized): Secondary | ICD-10-CM | POA: Diagnosis not present

## 2012-04-17 DIAGNOSIS — R110 Nausea: Secondary | ICD-10-CM | POA: Diagnosis not present

## 2012-04-17 DIAGNOSIS — Z01818 Encounter for other preprocedural examination: Secondary | ICD-10-CM | POA: Diagnosis not present

## 2012-04-17 DIAGNOSIS — Z01812 Encounter for preprocedural laboratory examination: Secondary | ICD-10-CM | POA: Diagnosis not present

## 2012-04-17 DIAGNOSIS — M7130 Other bursal cyst, unspecified site: Secondary | ICD-10-CM | POA: Diagnosis not present

## 2012-04-17 DIAGNOSIS — E1142 Type 2 diabetes mellitus with diabetic polyneuropathy: Secondary | ICD-10-CM | POA: Diagnosis present

## 2012-04-17 DIAGNOSIS — E119 Type 2 diabetes mellitus without complications: Secondary | ICD-10-CM | POA: Diagnosis not present

## 2012-04-17 DIAGNOSIS — M4800 Spinal stenosis, site unspecified: Secondary | ICD-10-CM | POA: Diagnosis not present

## 2012-04-17 DIAGNOSIS — Z79899 Other long term (current) drug therapy: Secondary | ICD-10-CM | POA: Diagnosis not present

## 2012-04-17 DIAGNOSIS — F329 Major depressive disorder, single episode, unspecified: Secondary | ICD-10-CM | POA: Diagnosis present

## 2012-04-17 DIAGNOSIS — Z4889 Encounter for other specified surgical aftercare: Secondary | ICD-10-CM | POA: Diagnosis not present

## 2012-04-17 DIAGNOSIS — M47817 Spondylosis without myelopathy or radiculopathy, lumbosacral region: Secondary | ICD-10-CM | POA: Diagnosis not present

## 2012-04-17 DIAGNOSIS — G9741 Accidental puncture or laceration of dura during a procedure: Secondary | ICD-10-CM | POA: Diagnosis not present

## 2012-04-17 DIAGNOSIS — Z96659 Presence of unspecified artificial knee joint: Secondary | ICD-10-CM | POA: Diagnosis not present

## 2012-04-17 DIAGNOSIS — R489 Unspecified symbolic dysfunctions: Secondary | ICD-10-CM | POA: Diagnosis not present

## 2012-04-17 DIAGNOSIS — R262 Difficulty in walking, not elsewhere classified: Secondary | ICD-10-CM | POA: Diagnosis not present

## 2012-04-17 DIAGNOSIS — M519 Unspecified thoracic, thoracolumbar and lumbosacral intervertebral disc disorder: Secondary | ICD-10-CM | POA: Diagnosis not present

## 2012-04-17 DIAGNOSIS — I10 Essential (primary) hypertension: Secondary | ICD-10-CM | POA: Diagnosis present

## 2012-04-17 DIAGNOSIS — Z9889 Other specified postprocedural states: Secondary | ICD-10-CM | POA: Diagnosis not present

## 2012-04-17 DIAGNOSIS — E1149 Type 2 diabetes mellitus with other diabetic neurological complication: Secondary | ICD-10-CM | POA: Diagnosis present

## 2012-04-17 DIAGNOSIS — G2581 Restless legs syndrome: Secondary | ICD-10-CM | POA: Diagnosis not present

## 2012-04-17 DIAGNOSIS — Z882 Allergy status to sulfonamides status: Secondary | ICD-10-CM | POA: Diagnosis not present

## 2012-04-17 DIAGNOSIS — K219 Gastro-esophageal reflux disease without esophagitis: Secondary | ICD-10-CM | POA: Diagnosis present

## 2012-04-17 DIAGNOSIS — E7800 Pure hypercholesterolemia, unspecified: Secondary | ICD-10-CM | POA: Diagnosis present

## 2012-04-17 DIAGNOSIS — E559 Vitamin D deficiency, unspecified: Secondary | ICD-10-CM | POA: Diagnosis not present

## 2012-04-17 DIAGNOSIS — M48062 Spinal stenosis, lumbar region with neurogenic claudication: Secondary | ICD-10-CM | POA: Diagnosis not present

## 2012-04-17 DIAGNOSIS — E785 Hyperlipidemia, unspecified: Secondary | ICD-10-CM | POA: Diagnosis not present

## 2012-04-17 DIAGNOSIS — Z7982 Long term (current) use of aspirin: Secondary | ICD-10-CM | POA: Diagnosis not present

## 2012-04-17 HISTORY — DX: Headache: R51

## 2012-04-17 HISTORY — DX: Other specified postprocedural states: Z98.890

## 2012-04-17 HISTORY — DX: Nausea with vomiting, unspecified: R11.2

## 2012-04-17 LAB — CBC
HCT: 38.2 % (ref 36.0–46.0)
Hemoglobin: 13.2 g/dL (ref 12.0–15.0)
RBC: 4.75 MIL/uL (ref 3.87–5.11)
WBC: 6.2 10*3/uL (ref 4.0–10.5)

## 2012-04-17 LAB — BASIC METABOLIC PANEL
Chloride: 105 mEq/L (ref 96–112)
GFR calc Af Amer: >90 mL/min (ref >90)
Potassium: 3.8 mEq/L (ref 3.5–5.1)
Sodium: 143 mEq/L (ref 135–145)

## 2012-04-17 LAB — SURGICAL PCR SCREEN
MRSA, PCR: NEGATIVE
Staphylococcus aureus: NEGATIVE

## 2012-04-17 MED ORDER — CEFAZOLIN SODIUM-DEXTROSE 2-3 GM-% IV SOLR
2.0000 g | INTRAVENOUS | Status: AC
  Administered 2012-04-18: 2 g via INTRAVENOUS

## 2012-04-17 NOTE — Progress Notes (Signed)
Dr Lloyd Huger called for cardiac studies

## 2012-04-17 NOTE — Pre-Procedure Instructions (Signed)
[name]  04/17/2012   Your procedure is scheduled on:  04/18/12  Report to [facility] at 100 pm  Call this number if you have problems the morning of surgery: [phone]   Remember:   Do not eat food or drink liquids after midnight.   Take these medicines the morning of surgery with A SIP OF WATER: xanax,metoprolol,verpamil,demadex   Do not wear jewelry, make-up or nail polish.  Do not wear lotions, powders, or perfumes. You may wear deodorant.  Do not shave 48 hours prior to surgery. Men may shave face and neck.  Do not bring valuables to the hospital.  Contacts, dentures or bridgework may not be worn into surgery.  Leave suitcase in the car. After surgery it may be brought to your room.  For patients admitted to the hospital, checkout time is 11:00 AM the day of  discharge.   Patients discharged the day of surgery will not be allowed to drive  home.  Name and phone number of your driver: family  Special Instructions: Shower using CHG 2 nights before surgery and the night before surgery.  If you shower the day of surgery use CHG.  Use special wash - you have one bottle of CHG for all showers.  You should use approximately 1/3 of the bottle for each shower.   Please read over the following fact sheets that you were given: Pain Booklet, Coughing and Deep Breathing, MRSA Information and Surgical Site Infection Prevention

## 2012-04-18 ENCOUNTER — Inpatient Hospital Stay (HOSPITAL_COMMUNITY): Payer: Medicare Other

## 2012-04-18 ENCOUNTER — Inpatient Hospital Stay (HOSPITAL_COMMUNITY): Payer: Medicare Other | Admitting: Anesthesiology

## 2012-04-18 ENCOUNTER — Inpatient Hospital Stay (HOSPITAL_COMMUNITY)
Admission: RE | Admit: 2012-04-18 | Discharge: 2012-04-25 | DRG: 490 | Disposition: A | Discharge status: Skilled Nursing Facility | Payer: Medicare Other | Source: Ambulatory Visit | Attending: Neurosurgery | Admitting: Neurosurgery

## 2012-04-18 ENCOUNTER — Encounter (HOSPITAL_COMMUNITY): Payer: Self-pay

## 2012-04-18 ENCOUNTER — Encounter (HOSPITAL_COMMUNITY): Payer: Self-pay | Admitting: Anesthesiology

## 2012-04-18 ENCOUNTER — Encounter (HOSPITAL_COMMUNITY)
Admission: RE | Disposition: A | Discharge status: Skilled Nursing Facility | Payer: Self-pay | Source: Ambulatory Visit | Attending: Neurosurgery

## 2012-04-18 DIAGNOSIS — Z79899 Other long term (current) drug therapy: Secondary | ICD-10-CM

## 2012-04-18 DIAGNOSIS — E7800 Pure hypercholesterolemia, unspecified: Secondary | ICD-10-CM | POA: Diagnosis present

## 2012-04-18 DIAGNOSIS — F329 Major depressive disorder, single episode, unspecified: Secondary | ICD-10-CM | POA: Diagnosis present

## 2012-04-18 DIAGNOSIS — Y9210 Unspecified residential institution as the place of occurrence of the external cause: Secondary | ICD-10-CM | POA: Diagnosis not present

## 2012-04-18 DIAGNOSIS — M48062 Spinal stenosis, lumbar region with neurogenic claudication: Secondary | ICD-10-CM | POA: Diagnosis not present

## 2012-04-18 DIAGNOSIS — Z7982 Long term (current) use of aspirin: Secondary | ICD-10-CM

## 2012-04-18 DIAGNOSIS — I10 Essential (primary) hypertension: Secondary | ICD-10-CM | POA: Diagnosis present

## 2012-04-18 DIAGNOSIS — Z882 Allergy status to sulfonamides status: Secondary | ICD-10-CM

## 2012-04-18 DIAGNOSIS — M519 Unspecified thoracic, thoracolumbar and lumbosacral intervertebral disc disorder: Secondary | ICD-10-CM | POA: Diagnosis not present

## 2012-04-18 DIAGNOSIS — M47817 Spondylosis without myelopathy or radiculopathy, lumbosacral region: Secondary | ICD-10-CM | POA: Diagnosis present

## 2012-04-18 DIAGNOSIS — F411 Generalized anxiety disorder: Secondary | ICD-10-CM | POA: Diagnosis present

## 2012-04-18 DIAGNOSIS — Z96659 Presence of unspecified artificial knee joint: Secondary | ICD-10-CM

## 2012-04-18 DIAGNOSIS — Z01812 Encounter for preprocedural laboratory examination: Secondary | ICD-10-CM

## 2012-04-18 DIAGNOSIS — E1149 Type 2 diabetes mellitus with other diabetic neurological complication: Secondary | ICD-10-CM | POA: Diagnosis present

## 2012-04-18 DIAGNOSIS — M7130 Other bursal cyst, unspecified site: Secondary | ICD-10-CM | POA: Diagnosis not present

## 2012-04-18 DIAGNOSIS — K219 Gastro-esophageal reflux disease without esophagitis: Secondary | ICD-10-CM | POA: Diagnosis present

## 2012-04-18 DIAGNOSIS — G9741 Accidental puncture or laceration of dura during a procedure: Secondary | ICD-10-CM | POA: Diagnosis not present

## 2012-04-18 DIAGNOSIS — R110 Nausea: Secondary | ICD-10-CM | POA: Diagnosis not present

## 2012-04-18 DIAGNOSIS — F3289 Other specified depressive episodes: Secondary | ICD-10-CM | POA: Diagnosis present

## 2012-04-18 DIAGNOSIS — E1142 Type 2 diabetes mellitus with diabetic polyneuropathy: Secondary | ICD-10-CM | POA: Diagnosis present

## 2012-04-18 DIAGNOSIS — M48061 Spinal stenosis, lumbar region without neurogenic claudication: Secondary | ICD-10-CM | POA: Diagnosis not present

## 2012-04-18 DIAGNOSIS — IMO0002 Reserved for concepts with insufficient information to code with codable children: Secondary | ICD-10-CM | POA: Diagnosis not present

## 2012-04-18 HISTORY — PX: LUMBAR LAMINECTOMY/DECOMPRESSION MICRODISCECTOMY: SHX5026

## 2012-04-18 LAB — GLUCOSE, CAPILLARY: Glucose-Capillary: 142 mg/dL — ABNORMAL HIGH (ref 70–99)

## 2012-04-18 SURGERY — LUMBAR LAMINECTOMY/DECOMPRESSION MICRODISCECTOMY 3 LEVELS
Anesthesia: General | Site: Back | Wound class: Clean

## 2012-04-18 MED ORDER — ACETAMINOPHEN 650 MG RE SUPP: 650.0000 mg | RECTAL | Status: DC | PRN

## 2012-04-18 MED ORDER — ONDANSETRON HCL 4 MG/2ML IJ SOLN: 4.0000 mg | Freq: Once | INTRAMUSCULAR | Status: DC | PRN

## 2012-04-18 MED ORDER — HYDROMORPHONE HCL PF 1 MG/ML IJ SOLN
INTRAMUSCULAR | Status: AC
  Administered 2012-04-18: 0.25 mg via INTRAVENOUS
  Filled 2012-04-18: qty 1

## 2012-04-18 MED ORDER — ACETAMINOPHEN 325 MG PO TABS
650.0000 mg | ORAL_TABLET | ORAL | Status: DC | PRN
  Administered 2012-04-23 – 2012-04-25 (×2): 650 mg via ORAL
  Filled 2012-04-18 (×2): qty 2

## 2012-04-18 MED ORDER — NEOSTIGMINE METHYLSULFATE 1 MG/ML IJ SOLN
INTRAMUSCULAR | Status: DC | PRN
  Administered 2012-04-18: 3 mg via INTRAVENOUS

## 2012-04-18 MED ORDER — LIDOCAINE-EPINEPHRINE 0.5 %-1:200000 IJ SOLN
INTRAMUSCULAR | Status: DC | PRN
  Administered 2012-04-18: 10 mL

## 2012-04-18 MED ORDER — ARTIFICIAL TEARS OP OINT
TOPICAL_OINTMENT | OPHTHALMIC | Status: DC | PRN
  Administered 2012-04-18: 1 via OPHTHALMIC

## 2012-04-18 MED ORDER — HEMOSTATIC AGENTS (NO CHARGE) OPTIME
TOPICAL | Status: DC | PRN
  Administered 2012-04-18: 1 via TOPICAL

## 2012-04-18 MED ORDER — ROCURONIUM BROMIDE 100 MG/10ML IV SOLN
INTRAVENOUS | Status: DC | PRN
  Administered 2012-04-18: 50 mg via INTRAVENOUS
  Administered 2012-04-18 (×3): 10 mg via INTRAVENOUS

## 2012-04-18 MED ORDER — LIDOCAINE HCL (CARDIAC) 20 MG/ML IV SOLN
INTRAVENOUS | Status: DC | PRN
  Administered 2012-04-18: 100 mg via INTRAVENOUS

## 2012-04-18 MED ORDER — PROPOFOL 10 MG/ML IV BOLUS
INTRAVENOUS | Status: DC | PRN
  Administered 2012-04-18: 80 mg via INTRAVENOUS

## 2012-04-18 MED ORDER — OXYCODONE HCL 5 MG/5ML PO SOLN: 5.0000 mg | Freq: Once | ORAL | Status: DC | PRN

## 2012-04-18 MED ORDER — ONDANSETRON HCL 4 MG/2ML IJ SOLN
INTRAMUSCULAR | Status: DC | PRN
  Administered 2012-04-18: 4 mg via INTRAVENOUS

## 2012-04-18 MED ORDER — GLYCOPYRROLATE 0.2 MG/ML IJ SOLN
INTRAMUSCULAR | Status: DC | PRN
  Administered 2012-04-18: 0.2 mg via INTRAVENOUS
  Administered 2012-04-18: 0.4 mg via INTRAVENOUS

## 2012-04-18 MED ORDER — OXYCODONE HCL 5 MG PO TABS: 5.0000 mg | ORAL_TABLET | Freq: Once | ORAL | Status: DC | PRN

## 2012-04-18 MED ORDER — LOSARTAN POTASSIUM 50 MG PO TABS
100.0000 mg | ORAL_TABLET | Freq: Every day | ORAL | Status: DC
  Administered 2012-04-19 – 2012-04-25 (×7): 100 mg via ORAL
  Filled 2012-04-18 (×8): qty 2

## 2012-04-18 MED ORDER — SODIUM CHLORIDE 0.9 % IV SOLN: 250.0000 mL | INTRAVENOUS | Status: DC

## 2012-04-18 MED ORDER — KETOROLAC TROMETHAMINE 30 MG/ML IJ SOLN
15.0000 mg | Freq: Four times a day (QID) | INTRAMUSCULAR | Status: DC
  Administered 2012-04-19 – 2012-04-21 (×10): 15 mg via INTRAVENOUS
  Filled 2012-04-18 (×15): qty 1

## 2012-04-18 MED ORDER — CYANOCOBALAMIN 500 MCG PO TABS
500.0000 ug | ORAL_TABLET | Freq: Two times a day (BID) | ORAL | Status: DC
  Administered 2012-04-19 – 2012-04-25 (×12): 500 ug via ORAL
  Filled 2012-04-18 (×15): qty 1

## 2012-04-18 MED ORDER — LACTATED RINGERS IV SOLN
INTRAVENOUS | Status: DC | PRN
  Administered 2012-04-18 (×2): via INTRAVENOUS

## 2012-04-18 MED ORDER — VERAPAMIL HCL ER 240 MG PO TBCR
240.0000 mg | EXTENDED_RELEASE_TABLET | Freq: Two times a day (BID) | ORAL | Status: DC
  Administered 2012-04-18 – 2012-04-25 (×13): 240 mg via ORAL
  Filled 2012-04-18 (×16): qty 1

## 2012-04-18 MED ORDER — DEXAMETHASONE SODIUM PHOSPHATE 10 MG/ML IJ SOLN
INTRAMUSCULAR | Status: DC | PRN
  Administered 2012-04-18: 4 mg via INTRAVENOUS

## 2012-04-18 MED ORDER — ACETAMINOPHEN 10 MG/ML IV SOLN
1000.0000 mg | Freq: Four times a day (QID) | INTRAVENOUS | Status: AC
  Administered 2012-04-18 – 2012-04-19 (×4): 1000 mg via INTRAVENOUS
  Filled 2012-04-18 (×3): qty 100

## 2012-04-18 MED ORDER — THROMBIN 5000 UNITS EX SOLR
CUTANEOUS | Status: DC | PRN
  Administered 2012-04-18 (×2): 5000 [IU] via TOPICAL

## 2012-04-18 MED ORDER — GLIPIZIDE ER 5 MG PO TB24
5.0000 mg | ORAL_TABLET | Freq: Every day | ORAL | Status: DC
  Administered 2012-04-19 – 2012-04-25 (×6): 5 mg via ORAL
  Filled 2012-04-18 (×8): qty 1

## 2012-04-18 MED ORDER — POTASSIUM CHLORIDE CRYS ER 20 MEQ PO TBCR
20.0000 meq | EXTENDED_RELEASE_TABLET | Freq: Every day | ORAL | Status: DC
  Administered 2012-04-19 – 2012-04-25 (×6): 20 meq via ORAL
  Filled 2012-04-18 (×7): qty 1

## 2012-04-18 MED ORDER — SODIUM CHLORIDE 0.9 % IJ SOLN
3.0000 mL | INTRAMUSCULAR | Status: DC | PRN
  Administered 2012-04-22: 3 mL via INTRAVENOUS

## 2012-04-18 MED ORDER — CYCLOBENZAPRINE HCL 10 MG PO TABS
10.0000 mg | ORAL_TABLET | Freq: Three times a day (TID) | ORAL | Status: DC | PRN
  Administered 2012-04-20: 10 mg via ORAL
  Filled 2012-04-18: qty 1

## 2012-04-18 MED ORDER — POLYETHYLENE GLYCOL 3350 17 G PO PACK
17.0000 g | PACK | Freq: Every day | ORAL | Status: DC | PRN
  Filled 2012-04-18: qty 1

## 2012-04-18 MED ORDER — ALPRAZOLAM 0.5 MG PO TABS
0.5000 mg | ORAL_TABLET | Freq: Two times a day (BID) | ORAL | Status: DC | PRN
  Administered 2012-04-20: 0.5 mg via ORAL
  Filled 2012-04-18: qty 1

## 2012-04-18 MED ORDER — METFORMIN HCL 500 MG PO TABS
500.0000 mg | ORAL_TABLET | Freq: Two times a day (BID) | ORAL | Status: DC
  Administered 2012-04-19 – 2012-04-25 (×12): 500 mg via ORAL
  Filled 2012-04-18 (×15): qty 1

## 2012-04-18 MED ORDER — OXYCODONE HCL 5 MG PO TABS
5.0000 mg | ORAL_TABLET | ORAL | Status: DC | PRN
  Administered 2012-04-18: 10 mg via ORAL
  Filled 2012-04-18: qty 2

## 2012-04-18 MED ORDER — TRIAMTERENE-HCTZ 37.5-25 MG PO TABS
0.5000 | ORAL_TABLET | Freq: Every day | ORAL | Status: DC
  Administered 2012-04-19 – 2012-04-25 (×7): 0.5 via ORAL
  Filled 2012-04-18 (×8): qty 0.5

## 2012-04-18 MED ORDER — CEFAZOLIN SODIUM-DEXTROSE 2-3 GM-% IV SOLR
INTRAVENOUS | Status: AC
  Filled 2012-04-18: qty 50

## 2012-04-18 MED ORDER — SENNA 8.6 MG PO TABS
1.0000 | ORAL_TABLET | Freq: Two times a day (BID) | ORAL | Status: DC
  Administered 2012-04-18 – 2012-04-25 (×12): 8.6 mg via ORAL
  Filled 2012-04-18 (×15): qty 1

## 2012-04-18 MED ORDER — SODIUM CHLORIDE 0.9 % IJ SOLN
3.0000 mL | Freq: Two times a day (BID) | INTRAMUSCULAR | Status: DC
  Administered 2012-04-18 – 2012-04-22 (×6): 3 mL via INTRAVENOUS

## 2012-04-18 MED ORDER — MORPHINE SULFATE 2 MG/ML IJ SOLN
1.0000 mg | INTRAMUSCULAR | Status: DC | PRN
  Administered 2012-04-19: 4 mg via INTRAVENOUS
  Administered 2012-04-20: 2 mg via INTRAVENOUS
  Administered 2012-04-20: 4 mg via INTRAVENOUS
  Administered 2012-04-20: 1 mg via INTRAVENOUS
  Administered 2012-04-20: 2 mg via INTRAVENOUS
  Administered 2012-04-20: 4 mg via INTRAVENOUS
  Administered 2012-04-21 (×2): 2 mg via INTRAVENOUS
  Filled 2012-04-18 (×2): qty 1
  Filled 2012-04-18: qty 2
  Filled 2012-04-18 (×2): qty 1
  Filled 2012-04-18 (×2): qty 2
  Filled 2012-04-18: qty 1

## 2012-04-18 MED ORDER — PRAMIPEXOLE DIHYDROCHLORIDE 0.125 MG PO TABS
0.1250 mg | ORAL_TABLET | Freq: Three times a day (TID) | ORAL | Status: DC
  Filled 2012-04-18: qty 1

## 2012-04-18 MED ORDER — PHENOL 1.4 % MT LIQD: 1.0000 | OROMUCOSAL | Status: DC | PRN

## 2012-04-18 MED ORDER — PRAMIPEXOLE DIHYDROCHLORIDE 0.25 MG PO TABS
0.2500 mg | ORAL_TABLET | Freq: Three times a day (TID) | ORAL | Status: DC
  Administered 2012-04-18 – 2012-04-25 (×18): 0.25 mg via ORAL
  Filled 2012-04-18 (×3): qty 1

## 2012-04-18 MED ORDER — EPHEDRINE SULFATE 50 MG/ML IJ SOLN
INTRAMUSCULAR | Status: DC | PRN
  Administered 2012-04-18 (×3): 10 mg via INTRAVENOUS

## 2012-04-18 MED ORDER — MIDAZOLAM HCL 5 MG/5ML IJ SOLN
INTRAMUSCULAR | Status: DC | PRN
  Administered 2012-04-18: 1 mg via INTRAVENOUS

## 2012-04-18 MED ORDER — POTASSIUM CHLORIDE IN NACL 20-0.9 MEQ/L-% IV SOLN
INTRAVENOUS | Status: DC
  Administered 2012-04-18 – 2012-04-22 (×5): via INTRAVENOUS
  Filled 2012-04-18 (×17): qty 1000

## 2012-04-18 MED ORDER — ACETAMINOPHEN 10 MG/ML IV SOLN
INTRAVENOUS | Status: AC
  Filled 2012-04-18: qty 100

## 2012-04-18 MED ORDER — HYDROMORPHONE HCL PF 1 MG/ML IJ SOLN
0.2500 mg | INTRAMUSCULAR | Status: DC | PRN
  Administered 2012-04-18: 0.5 mg via INTRAVENOUS
  Administered 2012-04-18: 0.25 mg via INTRAVENOUS

## 2012-04-18 MED ORDER — MEPERIDINE HCL 25 MG/ML IJ SOLN: 6.2500 mg | INTRAMUSCULAR | Status: DC | PRN

## 2012-04-18 MED ORDER — MENTHOL 3 MG MT LOZG: 1.0000 | LOZENGE | OROMUCOSAL | Status: DC | PRN

## 2012-04-18 MED ORDER — FENTANYL CITRATE 0.05 MG/ML IJ SOLN
INTRAMUSCULAR | Status: DC | PRN
  Administered 2012-04-18: 150 ug via INTRAVENOUS
  Administered 2012-04-18 (×2): 50 ug via INTRAVENOUS

## 2012-04-18 MED ORDER — METOPROLOL SUCCINATE ER 100 MG PO TB24
100.0000 mg | ORAL_TABLET | Freq: Every day | ORAL | Status: DC
  Administered 2012-04-19 – 2012-04-25 (×6): 100 mg via ORAL
  Filled 2012-04-18 (×7): qty 1

## 2012-04-18 MED ORDER — ONDANSETRON HCL 4 MG/2ML IJ SOLN
4.0000 mg | INTRAMUSCULAR | Status: DC | PRN
  Administered 2012-04-19 – 2012-04-22 (×8): 4 mg via INTRAVENOUS
  Filled 2012-04-18 (×9): qty 2

## 2012-04-18 MED ORDER — 0.9 % SODIUM CHLORIDE (POUR BTL) OPTIME
TOPICAL | Status: DC | PRN
  Administered 2012-04-18: 1000 mL

## 2012-04-18 MED ORDER — ASPIRIN EC 81 MG PO TBEC
81.0000 mg | DELAYED_RELEASE_TABLET | Freq: Every day | ORAL | Status: DC
  Administered 2012-04-19 – 2012-04-25 (×6): 81 mg via ORAL
  Filled 2012-04-18 (×7): qty 1

## 2012-04-18 SURGICAL SUPPLY — 58 items
BAG DECANTER FOR FLEXI CONT (MISCELLANEOUS) IMPLANT
BENZOIN TINCTURE PRP APPL 2/3 (GAUZE/BANDAGES/DRESSINGS) IMPLANT
BLADE SURG ROTATE 9660 (MISCELLANEOUS) IMPLANT
BUR MATCHSTICK NEURO 3.0 LAGG (BURR) ×2 IMPLANT
CANISTER SUCTION 2500CC (MISCELLANEOUS) ×2 IMPLANT
CLOTH BEACON ORANGE TIMEOUT ST (SAFETY) ×2 IMPLANT
CONT SPEC 4OZ CLIKSEAL STRL BL (MISCELLANEOUS) ×2 IMPLANT
DECANTER SPIKE VIAL GLASS SM (MISCELLANEOUS) ×2 IMPLANT
DERMABOND ADVANCED (GAUZE/BANDAGES/DRESSINGS) ×1
DERMABOND ADVANCED .7 DNX12 (GAUZE/BANDAGES/DRESSINGS) ×1 IMPLANT
DRAPE LAPAROTOMY 100X72X124 (DRAPES) ×2 IMPLANT
DRAPE MICROSCOPE LEICA (MISCELLANEOUS) ×2 IMPLANT
DRAPE POUCH INSTRU U-SHP 10X18 (DRAPES) ×2 IMPLANT
DRAPE PROXIMA HALF (DRAPES) ×6 IMPLANT
DRAPE SURG 17X23 STRL (DRAPES) ×2 IMPLANT
DURAFORM SPONGE 2X2 SINGLE (Neuro Prosthesis/Implant) ×2 IMPLANT
DURAPREP 26ML APPLICATOR (WOUND CARE) ×2 IMPLANT
DURASEAL SPINE SEALANT 3ML (MISCELLANEOUS) ×2 IMPLANT
ELECT REM PT RETURN 9FT ADLT (ELECTROSURGICAL) ×2
ELECTRODE REM PT RTRN 9FT ADLT (ELECTROSURGICAL) ×1 IMPLANT
EXTENDED TIP APPLICATOR 8CM ×2 IMPLANT
GAUZE SPONGE 4X4 16PLY XRAY LF (GAUZE/BANDAGES/DRESSINGS) ×2 IMPLANT
GLOVE BIO SURGEON STRL SZ8 (GLOVE) ×2 IMPLANT
GLOVE BIOGEL PI IND STRL 6.5 (GLOVE) ×1 IMPLANT
GLOVE BIOGEL PI INDICATOR 6.5 (GLOVE) ×1
GLOVE ECLIPSE 6.5 STRL STRAW (GLOVE) ×6 IMPLANT
GLOVE EXAM NITRILE LRG STRL (GLOVE) IMPLANT
GLOVE EXAM NITRILE MD LF STRL (GLOVE) IMPLANT
GLOVE EXAM NITRILE XL STR (GLOVE) IMPLANT
GLOVE EXAM NITRILE XS STR PU (GLOVE) IMPLANT
GLOVE INDICATOR 7.0 STRL GRN (GLOVE) ×4 IMPLANT
GOWN BRE IMP SLV AUR LG STRL (GOWN DISPOSABLE) ×4 IMPLANT
GOWN BRE IMP SLV AUR XL STRL (GOWN DISPOSABLE) ×2 IMPLANT
GOWN STRL REIN 2XL LVL4 (GOWN DISPOSABLE) IMPLANT
KIT BASIN OR (CUSTOM PROCEDURE TRAY) ×2 IMPLANT
KIT ROOM TURNOVER OR (KITS) ×2 IMPLANT
NEEDLE HYPO 25X1 1.5 SAFETY (NEEDLE) ×2 IMPLANT
NEEDLE SPNL 18GX3.5 QUINCKE PK (NEEDLE) ×2 IMPLANT
NS IRRIG 1000ML POUR BTL (IV SOLUTION) ×2 IMPLANT
PACK LAMINECTOMY NEURO (CUSTOM PROCEDURE TRAY) ×2 IMPLANT
PAD ARMBOARD 7.5X6 YLW CONV (MISCELLANEOUS) ×6 IMPLANT
PATTIES SURGICAL .5 X.5 (GAUZE/BANDAGES/DRESSINGS) ×2 IMPLANT
PATTIES SURGICAL 1X1 (DISPOSABLE) ×2 IMPLANT
RUBBERBAND STERILE (MISCELLANEOUS) ×4 IMPLANT
SPONGE GAUZE 4X4 12PLY (GAUZE/BANDAGES/DRESSINGS) ×2 IMPLANT
SPONGE LAP 4X18 X RAY DECT (DISPOSABLE) IMPLANT
SPONGE SURGIFOAM ABS GEL SZ50 (HEMOSTASIS) ×2 IMPLANT
STRIP CLOSURE SKIN 1/2X4 (GAUZE/BANDAGES/DRESSINGS) IMPLANT
SUT ETHILON 3 0 FSL (SUTURE) ×4 IMPLANT
SUT VIC AB 0 CT1 18XCR BRD8 (SUTURE) ×3 IMPLANT
SUT VIC AB 0 CT1 8-18 (SUTURE) ×6
SUT VIC AB 2-0 CT1 18 (SUTURE) ×4 IMPLANT
SUT VIC AB 3-0 SH 8-18 (SUTURE) ×4 IMPLANT
SYR 20ML ECCENTRIC (SYRINGE) ×2 IMPLANT
TAPE CLOTH SURG 4X10 WHT LF (GAUZE/BANDAGES/DRESSINGS) ×2 IMPLANT
TOWEL OR 17X24 6PK STRL BLUE (TOWEL DISPOSABLE) ×2 IMPLANT
TOWEL OR 17X26 10 PK STRL BLUE (TOWEL DISPOSABLE) ×2 IMPLANT
WATER STERILE IRR 1000ML POUR (IV SOLUTION) ×2 IMPLANT

## 2012-04-18 NOTE — Preoperative (Signed)
Beta Blockers   Reason not to administer Beta Blockers:Not Applicable 

## 2012-04-18 NOTE — Anesthesia Preprocedure Evaluation (Addendum)
Anesthesia Evaluation  Patient identified by MRN, date of birth, ID band Patient awake    Reviewed: Allergy & Precautions, H&P , NPO status , Patient's Chart, lab work & pertinent test results  History of Anesthesia Complications (+) PONV  Airway Mallampati: II TM Distance: >3 FB Neck ROM: Full    Dental  (+) Teeth Intact   Pulmonary          Cardiovascular hypertension, Pt. on medications and Pt. on home beta blockers     Neuro/Psych  Headaches, PSYCHIATRIC DISORDERS Anxiety Depression    GI/Hepatic negative GI ROS, Neg liver ROS,   Endo/Other  diabetes, Type 2, Oral Hypoglycemic Agents  Renal/GU negative Renal ROS     Musculoskeletal negative musculoskeletal ROS (+)   Abdominal   Peds  Hematology negative hematology ROS (+)   Anesthesia Other Findings   Reproductive/Obstetrics negative OB ROS                         Anesthesia Physical Anesthesia Plan  ASA: III  Anesthesia Plan: General   Post-op Pain Management:    Induction: Intravenous  Airway Management Planned: Oral ETT  Additional Equipment:   Intra-op Plan:   Post-operative Plan: Extubation in OR  Informed Consent: I have reviewed the patients History and Physical, chart, labs and discussed the procedure including the risks, benefits and alternatives for the proposed anesthesia with the patient or authorized representative who has indicated his/her understanding and acceptance.     Plan Discussed with: CRNA and Surgeon  Anesthesia Plan Comments:         Anesthesia Quick Evaluation

## 2012-04-18 NOTE — Transfer of Care (Signed)
Immediate Anesthesia Transfer of Care Note  Patient: [name]  Procedure(s) Performed: Procedure(s) with comments: LUMBAR LAMINECTOMY/DECOMPRESSION MICRODISCECTOMY 3 LEVELS (N/A) - Lumbar three-four, lumbar four-five, lumbar five-sacral one decompression. Synovial cyst resection lumbar four-five  Patient Location: PACU  Anesthesia Type:General  Level of Consciousness: awake, alert  and oriented  Airway & Oxygen Therapy: Patient Spontanous Breathing and Patient connected to nasal cannula oxygen  Post-op Assessment: Report given to PACU RN and Post -op Vital signs reviewed and stable  Post vital signs: Reviewed and stable  Complications: No apparent anesthesia complications

## 2012-04-18 NOTE — Op Note (Addendum)
04/18/2012  6:49 PM  PATIENT:  [name]  72 y.o. female  PRE-OPERATIVE DIAGNOSIS:  lumbar stenosis lumbar spondylosis L3-S1  POST-OPERATIVE DIAGNOSIS:  lumbar stenosis lumbar spondylosis L3-S1  PROCEDURE:  Procedure(s): LUMBAR LAMINECTOMY/DECOMPRESSION MICRODISCECTOMY 3 LEVELS Complete laminectomy L4, L5, Hemilaminectomies L3, and S1 Synovial cyst resection right L4/5 Microdissection SURGEON:  Surgeon(s): Carmela Hurt, MD Tia Alert, MD  ASSISTANTS:Jones, Onalee Hua  ANESTHESIA:   general  EBL:  Total I/O In: 1650 [I.V.:1650] Out: 575 [Urine:325; Blood:250]  BLOOD ADMINISTERED:none  CELL SAVER GIVEN:none  COUNT:per nursing    DRAINS: none   SPECIMEN:  No Specimen  DICTATION: [name] was brought to the operating room, intubated and placed under a general anesthetic without difficulty. She was positioned prone on a Wilson frame with all pressure points properly padded. Her back was prepped and draped in a sterile manner. I infiltrated 10cc into the lumbar region. I placed a needle into the lumbar area and by xray it appeared to be at L5/S1. I based my opening on that xray and wound up being too rostral. I closed the thoracolumbar fascia in that area then extended the incision caudally. I confirmed the correct location with an intraoperative xray and proceeded with the decompression. I exposed the lamina of L3,4,and 5 bilaterally. I used a nutcracker, Leksell rongeurs, the drill, and Kerrison punches to perform the laminectomy.   There was a fairly large cyst emanating from the L4/5 facet on the right. I used a double ended ganglion knife and a penfield 4 dissector to free the dura from the synovial cyst wall. When I went in with the Kerrison punch to resect the cyst the dura was opened. At that point I dissected further to make sure the cyst was resected since I already had a dural tear. The dural opening was not able to be closed primarily. I performed complete  laminectomies of L3,4,and 5 bilaterally. I decompressed the spinal canal and the L3,4,and 5 roots bilaterally Dr. Yetta Barre and I used microdissection to resect the cyst and for the decompression of the nerve roots. I used onlay dural graft material along with dura seal to close the dura. We then closed the wound in layers. I approximated the thoracolumbar fascia, subcutaneous, and subcuticular layers. I closed the portion of the wound overlying the decompression with vertical interrupted mattress sutures in the skin in addition to the other layers of closure. I applied a sterile dressing. [name] was extubated and moving all extremities.   PLAN OF CARE: Admit to inpatient   PATIENT DISPOSITION:  PACU - hemodynamically stable.   Delay start of Pharmacological VTE agent (>24hrs) due to surgical blood loss or risk of bleeding:  yes

## 2012-04-18 NOTE — H&P (Signed)
BP 118/79  Pulse 51  Temp(Src) 97.1 F (36.2 C) (Oral)  Resp 18  SpO2 98%  [name] comes in today for evaluation of pain that she has in the lower extremities and difficulty with her gait.  She has had these problems since August of 2011.  She finds herself having to use a walker, either that or she is always having to reach out and touch something in order to keep her balance.  She is 72 year of age, is a retired Runner, broadcasting/film/video, and is right-handed. She does have back pain which she has mentioned to her daughter on multiple occasions.  The pain is worse whenever she stands or walks.  She walks stooped over. She had urinary incontinence for approximately two months. She does feel as if she is weak in her back.  She does have some headaches.    PAST MEDICAL HISTORY:  Significant for hypertension and diabetes.  She has undergone a hysterectomy and she has had a right knee replacement.  She has no known drug allergies.  Medications are Aspirin, Losartan, Klor-Con, Verapamil, Glipizide, Alprazolam, Metoprolol, Tramipexole(?sp), Metformin, Triamterene, and Vitamin B12.  She says nothing helps to relieve her pain.    FAMILY HISTORY:    Her mother died at age 82.  Her father died at age 87.  Cerebrovascular disease and renal failure present in the family history.    SOCIAL HISTORY:    She does not smoke.  She does not use alcohol. She does not use illicit drugs. She reports being 5'7" tall and weighing approximately 190 lbs.    REVIEW OF SYSTEMS:   Positive for eyeglasses, nasal congestion, hypertension, swelling in her feet, leg pain with walking, difficulty stopping a urinary stream, back pain, arthritis, and diabetes. She denies allergic, hematologic, psychiatric, neurological, skin, gastrointestinal, and constitutional problems.   PHYSICAL EXAMINATION:  On examination, [name] is alert, oriented x 4, and answering all questions appropriately.  Memory, language, attention span, and fund of  knowledge are normal. She is well kempt and in mild distress. She has 5/5 strength in the upper and lower extremities. Reflexes are 2+ at the knees and ankles.  She has intact proprioception.  She has no clonus.  No Hoffmann's sign. The toes are downgoing to plantar stimulation.  Romberg test was negative.  Normal muscle tone, bulk, and coordination.      [name] has had problems now for quite some time.  Dr. Zannie Cove information said she did rehab after she underwent a knee replacement in August of 2012 on the right side, and at that time she felt that she was walking fairly well.  She walked home without assistance, but ever since December 2012 when she took a fall without warning sign, she has been very hesitant.  She complaints that she is dragging her feet across the floor and that her balance is just very very poor.   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 She has a workup for the peripheral neuropathy which I believe to date has been negative. She also had an MRI of the cervical spine which I reviewed, which did not show any severe cervical stenosis, just spondylitic change throughout.    In the lumbar spine, the MRI showed that she is stenotic at 4-5 where she has the most problems.  She has some stenosis also at 3-4 and at 5-1 and facet arthropathy. The  conus is normal. The cauda equina is normal.    DIAGNOSIS:     Low back pain, lumbar spondylosis and lumbar stenosis. I do believe that [name] would improve with a lumbar decompression at 3-4, 4-5, and 5-1. I do not think she needs to be fused.  I do think that this would help with her walking to some degree.  However, I do not think that this is going to reverse the imbalance that she has because I do not think the balance problem stems from this, but is more than likely a result of the peripheral neuropathy.  Given these  findings, she needs to think about this and speak with her family. Risks and benefits were discussed thoroughly with she and her daughter of bleeding, infection, no relief, need for further surgery.  I informed her that I simply could not offer her a guarantee and that frankly it would be inappropriate to do so, but given the fact that she wants very much to be able to tend to her husband who currently is being hospitalized for an above knee amputation on the left side, that she give strong consideration at least to the surgery. I explained what stenosis is and why it does not get better with medication or therapy. She has agreed to the surgery.

## 2012-04-18 NOTE — Anesthesia Postprocedure Evaluation (Signed)
Anesthesia Post Note  Patient: [name]  Procedure(s) Performed: Procedure(s) (LRB): LUMBAR LAMINECTOMY/DECOMPRESSION MICRODISCECTOMY 3 LEVELS (N/A)  Anesthesia type: General  Patient location: PACU  Post pain: Pain level controlled and Adequate analgesia  Post assessment: Post-op Vital signs reviewed, Patient's Cardiovascular Status Stable, Respiratory Function Stable, Patent Airway and Pain level controlled  Last Vitals:  Filed Vitals:   04/18/12 1830  BP: 99/45  Pulse: 53  Temp:   Resp: 15    Post vital signs: Reviewed and stable  Level of consciousness: awake, alert  and oriented  Complications: No apparent anesthesia complications

## 2012-04-18 NOTE — Anesthesia Procedure Notes (Signed)
Procedure Name: Intubation Performed by: Jeani Hawking Pre-anesthesia Checklist: Patient identified, Emergency Drugs available, Suction available, Patient being monitored and Timeout performed Patient Re-evaluated:Patient Re-evaluated prior to inductionOxygen Delivery Method: Circle system utilized Preoxygenation: Pre-oxygenation with 100% oxygen Intubation Type: IV induction Ventilation: Mask ventilation without difficulty and Oral airway inserted - appropriate to patient size Laryngoscope Size: 2 and Miller Grade View: Grade II Tube type: Oral Tube size: 7.0 mm Number of attempts: 1 Airway Equipment and Method: Stylet Placement Confirmation: ETT inserted through vocal cords under direct vision,  breath sounds checked- equal and bilateral,  positive ETCO2 and CO2 detector Secured at: 22 cm Tube secured with: Tape Dental Injury: Teeth and Oropharynx as per pre-operative assessment

## 2012-04-19 ENCOUNTER — Encounter (HOSPITAL_COMMUNITY): Payer: Self-pay | Admitting: *Deleted

## 2012-04-19 DIAGNOSIS — M48062 Spinal stenosis, lumbar region with neurogenic claudication: Principal | ICD-10-CM | POA: Diagnosis present

## 2012-04-19 LAB — GLUCOSE, CAPILLARY: Glucose-Capillary: 162 mg/dL — ABNORMAL HIGH (ref 70–99)

## 2012-04-19 MED ORDER — WHITE PETROLATUM GEL
Status: AC
  Administered 2012-04-19: 07:00:00
  Filled 2012-04-19: qty 5

## 2012-04-19 MED ORDER — PROCHLORPERAZINE 25 MG RE SUPP
25.0000 mg | Freq: Two times a day (BID) | RECTAL | Status: DC | PRN
  Administered 2012-04-19 – 2012-04-20 (×2): 25 mg via RECTAL
  Filled 2012-04-19 (×3): qty 1

## 2012-04-19 NOTE — Progress Notes (Signed)
UR COMPLETED  

## 2012-04-19 NOTE — Progress Notes (Signed)
Patient ID: [name], female   DOB: September 28, 1940, 72 y.o.   MRN: [id number] BP 137/71  Pulse 70  Temp(Src) 98.6 F (37 C) (Oral)  Resp 18  Ht 5' 7.5" (1.715 m)  Wt 99.6 kg (219 lb 9.3 oz)  BMI 33.86 kg/m2  SpO2 97% Alert and oriented x4, speech is clear and fluent Moving lower extremities well Wound is clean, flat, and dry Continue on bedrest at least 48 more hours.  Nauseated, will try compazine.

## 2012-04-20 LAB — BASIC METABOLIC PANEL
BUN: 10 mg/dL (ref 6–23)
CO2: 25 mEq/L (ref 19–32)
GFR calc non Af Amer: 86 mL/min — ABNORMAL LOW (ref >90)
Glucose, Bld: 222 mg/dL — ABNORMAL HIGH (ref 70–99)
Potassium: 4.1 mEq/L (ref 3.5–5.1)
Sodium: 141 mEq/L (ref 135–145)

## 2012-04-20 LAB — GLUCOSE, CAPILLARY
Glucose-Capillary: 187 mg/dL — ABNORMAL HIGH (ref 70–99)
Glucose-Capillary: 197 mg/dL — ABNORMAL HIGH (ref 70–99)
Glucose-Capillary: 211 mg/dL — ABNORMAL HIGH (ref 70–99)
Glucose-Capillary: 221 mg/dL — ABNORMAL HIGH (ref 70–99)

## 2012-04-20 NOTE — Clinical Social Work Note (Signed)
Clinical Social Work   CSW received consult for SNF. CSW reviewed chart and discussed pt with RN during progression. Per chart review, pt is currently on bed rest and awaiting PT/OT evals. CSW will assess for SNF, if appropriate. CSW is available, if a need arises. CSW will continue to follow.   Dede Query, MSW, Theresia Majors [phone]

## 2012-04-20 NOTE — Progress Notes (Signed)
Patient ID: [name], female   DOB: [birth year], 72 y.o.   MRN: [id number] BP 184/86  Pulse 80  Temp(Src) 98.2 F (36.8 C) (Oral)  Resp 17  Ht 5' 7.5" (1.715 m)  Wt 99.6 kg (219 lb 9.3 oz)  BMI 33.86 kg/m2  SpO2 100% Alert, confused earlier, following all commands now Nauseated, multiple episodes of emesis throughout the day, including now. Will send labs. Wound is clean, flat and dry.  Continue I V, not tolerating oral meds

## 2012-04-20 NOTE — Progress Notes (Signed)
Pt confused to place and time. Pt nauseated and vomiting, unable to take po meds. MD notified.

## 2012-04-21 ENCOUNTER — Inpatient Hospital Stay (HOSPITAL_COMMUNITY): Payer: Medicare Other

## 2012-04-21 DIAGNOSIS — R110 Nausea: Secondary | ICD-10-CM | POA: Diagnosis not present

## 2012-04-21 DIAGNOSIS — Z9889 Other specified postprocedural states: Secondary | ICD-10-CM | POA: Diagnosis not present

## 2012-04-21 LAB — GLUCOSE, CAPILLARY
Glucose-Capillary: 218 mg/dL — ABNORMAL HIGH (ref 70–99)
Glucose-Capillary: 230 mg/dL — ABNORMAL HIGH (ref 70–99)
Glucose-Capillary: 263 mg/dL — ABNORMAL HIGH (ref 70–99)

## 2012-04-21 MED ORDER — HYDROCODONE-ACETAMINOPHEN 5-325 MG PO TABS
1.0000 | ORAL_TABLET | ORAL | Status: DC | PRN
  Administered 2012-04-22 – 2012-04-25 (×3): 2 via ORAL
  Filled 2012-04-21: qty 2
  Filled 2012-04-21: qty 1
  Filled 2012-04-21 (×2): qty 2

## 2012-04-21 MED ORDER — METOCLOPRAMIDE HCL 5 MG PO TABS
5.0000 mg | ORAL_TABLET | Freq: Three times a day (TID) | ORAL | Status: DC
  Administered 2012-04-21 – 2012-04-25 (×14): 5 mg via ORAL
  Filled 2012-04-21 (×15): qty 1

## 2012-04-21 NOTE — Progress Notes (Signed)
Patient ID: [name], female   DOB: [birth year], 72 y.o.   MRN: [id number] BP 171/77  Pulse 79  Temp(Src) 98.4 F (36.9 C) (Oral)  Resp 18  Ht 5' 7.5" (1.715 m)  Wt 99.6 kg (219 lb 9.3 oz)  BMI 33.86 kg/m2  SpO2 95% Alert and oriented x 4 Continued emesis throughout the night Moving lower extremities well Wound flat and dry Labs look ok. Will do flat plate abdomen

## 2012-04-22 ENCOUNTER — Encounter (HOSPITAL_COMMUNITY): Payer: Self-pay | Admitting: Neurosurgery

## 2012-04-22 LAB — GLUCOSE, CAPILLARY
Glucose-Capillary: 241 mg/dL — ABNORMAL HIGH (ref 70–99)
Glucose-Capillary: 243 mg/dL — ABNORMAL HIGH (ref 70–99)
Glucose-Capillary: 281 mg/dL — ABNORMAL HIGH (ref 70–99)
Glucose-Capillary: 313 mg/dL — ABNORMAL HIGH (ref 70–99)
Glucose-Capillary: 350 mg/dL — ABNORMAL HIGH (ref 70–99)

## 2012-04-22 MED ORDER — INSULIN ASPART 100 UNIT/ML ~~LOC~~ SOLN
0.0000 [IU] | Freq: Three times a day (TID) | SUBCUTANEOUS | Status: DC
  Administered 2012-04-23 – 2012-04-24 (×4): 5 [IU] via SUBCUTANEOUS
  Administered 2012-04-24 – 2012-04-25 (×3): 3 [IU] via SUBCUTANEOUS
  Administered 2012-04-25: 5 [IU] via SUBCUTANEOUS

## 2012-04-22 MED ORDER — INSULIN ASPART 100 UNIT/ML ~~LOC~~ SOLN
0.0000 [IU] | Freq: Every day | SUBCUTANEOUS | Status: DC
  Administered 2012-04-22: 4 [IU] via SUBCUTANEOUS

## 2012-04-22 NOTE — Progress Notes (Signed)
No new issues. Patient still feels significant pain in her lower back. Denies any leg pain. Still having some occasional emesis without any significant abdominal pain. X-ray yesterday negative.  Afebrile. Heart rate and blood pressure acceptable. Urine output good. Patient awake and alert. She is somewhat confused. She reorient easily. Motor and sensory function of the extremities normal. Chest and abdomen currently benign.  Progressing slowly. Continue efforts at mobilization. No new orders.

## 2012-04-22 NOTE — Progress Notes (Signed)
Pt BGL at 0823 was 263, no insulin coverage coverage, oral antidiabetic meds ordered for 0800 tomorrow.  MD paged, no new orders at this time.  Will continue to monitor blood sugar.

## 2012-04-22 NOTE — Progress Notes (Signed)
Pt continues to have episodes of dark gray liquid emesis and nausea.  Zofran given for most recent episode, will continue to monitor.

## 2012-04-23 LAB — HEMOGLOBIN A1C
Hgb A1c MFr Bld: 7.6 % — ABNORMAL HIGH
Mean Plasma Glucose: 171 mg/dL — ABNORMAL HIGH

## 2012-04-23 LAB — GLUCOSE, CAPILLARY
Glucose-Capillary: 175 mg/dL — ABNORMAL HIGH (ref 70–99)
Glucose-Capillary: 247 mg/dL — ABNORMAL HIGH (ref 70–99)
Glucose-Capillary: 222 mg/dL — ABNORMAL HIGH (ref 70–99)
Glucose-Capillary: 186 mg/dL — ABNORMAL HIGH (ref 70–99)

## 2012-04-23 NOTE — Progress Notes (Signed)
NCM provided pt with [facility] list to review. Will need orders for if dc home with [facility]. Isidoro Donning RN CCM Case Mgmt phone [phone]

## 2012-04-23 NOTE — Progress Notes (Signed)
Inpatient Diabetes Program Recommendations  AACE/ADA: New Consensus Statement on Inpatient Glycemic Control (2013)  Target Ranges:  Prepandial:   less than 140 mg/dL      Peak postprandial:   less than 180 mg/dL (1-2 hours)      Critically ill patients:  140 - 180 mg/dL   Reason for Visit: Hyperglycemia Results for [name], [name] (MRN [id number]) as of 04/23/2012 11:18  Ref. Range 04/22/2012 16:32 04/22/2012 20:57 04/23/2012 00:00 04/23/2012 04:17 04/23/2012 07:54  Glucose-Capillary Latest Range: 70-99 mg/dL 578 (H) 469 (H) 629 (H) 175 (H) 247 (H)  Results for [name], [name] (MRN [id number]) as of 04/23/2012 11:18  Ref. Range 04/22/2012 21:31  Hemoglobin A1C Latest Range: <5.7 % 7.6 (H)     Inpatient Diabetes Program Recommendations Insulin - Basal: May benefit from addition of basal insulin - Lantus 10 units QHS  Note: May also need some meal coverage insulin when po intake continues to increase - Novolog 3 units tidwc. Will continue to follow.  Thank you. Ailene Ards, RD, LDN, CDE Inpatient Diabetes Coordinator [phone]

## 2012-04-24 LAB — GLUCOSE, CAPILLARY
Glucose-Capillary: 300 mg/dL — ABNORMAL HIGH (ref 70–99)
Glucose-Capillary: 126 mg/dL — ABNORMAL HIGH (ref 70–99)

## 2012-04-24 NOTE — Progress Notes (Addendum)
Late entry notes for 04/22/2012: Pt was found sitting on floor in front of her bedside commode about 2000. Denies pain and said she was trying to reach something from the chair in front of her. VS taken as ff: BP:158/64, HR-86, RR-17, T-99.1. SaO2 99% on room air. Pt was placed back on bed using the mechanical lift. Dr. Franky Macho @ 2040 was made aware. No orders made at the time. Family(daughter Marcelino Duster) abt 2045 was also made aware. Marcelino Duster, her daughter came by to check on her mother and said she appreciated our notifying her of the incident. Pt had been comfortable and denied any pain the rest of the night.

## 2012-04-24 NOTE — Clinical Social Work Psychosocial (Signed)
Clinical Social Work Department BRIEF PSYCHOSOCIAL ASSESSMENT 04/24/2012  Patient:  [name], [name]     Account Number:  [id number]     Admit date:  04/18/2012  Clinical Social Worker:  Peggyann Shoals  Date/Time:  04/24/2012 05:00 PM  Referred by:  Physician  Date Referred:  04/24/2012 Referred for  SNF Placement   Other Referral:   Interview type:  Family Other interview type:    PSYCHOSOCIAL DATA Living Status:  HUSBAND Admitted from facility:   Level of care:   Primary support name:  [name] Primary support relationship to patient:  SPOUSE Degree of support available:   supportive.    CURRENT CONCERNS Current Concerns  Post-Acute Placement   Other Concerns:    SOCIAL WORK ASSESSMENT / PLAN CSW met with pt and family to address consult for SNF. CSW introduced herself and explained role of social work. CSW also explained the process of discharging to SNF with Medicare.    Pt live with husband; however, her husband is currently at a SNF, and she staying with her daughter. Pt and family are interested in CIR. CSW will follow up with RN for consult to CIR. Pt's second choice would be [facility].    CSW will initiate SN F referral and follow up with bed offers. CSW will continue to follow.   Assessment/plan status:  Psychosocial Support/Ongoing Assessment of Needs Other assessment/ plan:   Information/referral to community resources:   SNF list    PATIENT'S/FAMILY'S RESPONSE TO PLAN OF CARE: Pt was pleasant and agreeable to discharge plan.     Dede Query, MSW, Theresia Majors [phone]

## 2012-04-24 NOTE — Progress Notes (Signed)
Patient ID: [name], female   DOB: 02-14-1941, 72 y.o.   MRN: [id number] BP 136/77  Pulse 67  Temp(Src) 98.8 F (37.1 C) (Oral)  Resp 18  Ht 5' 7.5" (1.715 m)  Wt 99.6 kg (219 lb 9.3 oz)  BMI 33.86 kg/m2  SpO2 99% Alert and oriented x 4 Speech is clear and fluent Moving lower extremities well Wound is clean and dry Continue pt

## 2012-04-24 NOTE — Evaluation (Signed)
Occupational Therapy Evaluation Patient Details Name: [name] MRN: [id number] DOB: 17-Dec-1940 Today's Date: 04/24/2012 Time: 6295-2841 OT Time Calculation (min): 17 min  OT Assessment / Plan / Recommendation Clinical Impression  Pt admitted for 3 level lumbar laminectomy/decompression/microdiskectomy.  Hospital course complicated by prolonged bedrest, delirium, N/V.  Pt now presents with confusion, but is moving remarkably well for her first day OOB.  She is dependent in most aspects of ADL. Will need SNF for ST rehab.    OT Assessment  Patient needs continued OT Services    Follow Up Recommendations  SNF    Barriers to Discharge Decreased caregiver support    Equipment Recommendations  None recommended by OT    Recommendations for Other Services    Frequency  Min 2X/week    Precautions / Restrictions Precautions Precautions: Back;Fall Precaution Booklet Issued: Yes (comment) Precaution Comments: Reviewed handout.  Restrictions Weight Bearing Restrictions: No   Pertinent Vitals/Pain Post surgical back, repositioned    ADL  Eating/Feeding: Independent Where Assessed - Eating/Feeding: Chair Grooming: Wash/dry hands;Wash/dry face;Supervision/safety Where Assessed - Grooming: Supported sitting Upper Body Bathing: Minimal assistance Where Assessed - Upper Body Bathing: Unsupported sitting Lower Body Bathing: +1 Total assistance Where Assessed - Lower Body Bathing: Unsupported sitting;Supported sit to stand Upper Body Dressing: Minimal assistance Where Assessed - Upper Body Dressing: Unsupported sitting Lower Body Dressing: +1 Total assistance Where Assessed - Lower Body Dressing: Unsupported sitting;Supported sit to stand Equipment Used: Gait belt;Rolling walker Transfers/Ambulation Related to ADLs: Pt ambulating in hall with PT upon OTs arrival with min assist and RW with chair following for safety. ADL Comments: Pt likely to need a lot of repetition to adhere to  back precautions due to cognitive status.    OT Diagnosis: Generalized weakness;Cognitive deficits;Acute pain  OT Problem List: Decreased strength;Impaired balance (sitting and/or standing);Decreased activity tolerance;Decreased cognition;Decreased safety awareness;Decreased knowledge of use of DME or AE;Decreased knowledge of precautions;Pain OT Treatment Interventions: Self-care/ADL training;DME and/or AE instruction;Therapeutic activities;Patient/family education   OT Goals Acute Rehab OT Goals OT Goal Formulation: With patient Time For Goal Achievement: 05/08/12 Potential to Achieve Goals: Good ADL Goals Pt Will Perform Grooming: with supervision;Standing at sink ADL Goal: Grooming - Progress: Goal set today Pt Will Perform Lower Body Bathing: with min assist;Sitting, edge of bed;Sit to stand from bed;with adaptive equipment ADL Goal: Lower Body Bathing - Progress: Goal set today Pt Will Perform Lower Body Dressing: with min assist;Sit to stand from bed;with adaptive equipment ADL Goal: Lower Body Dressing - Progress: Goal set today Pt Will Transfer to Toilet: with supervision;Ambulation;3-in-1;Maintaining back safety precautions ADL Goal: Toilet Transfer - Progress: Goal set today Pt Will Perform Toileting - Clothing Manipulation: with supervision;Standing ADL Goal: Toileting - Clothing Manipulation - Progress: Goal set today Pt Will Perform Toileting - Hygiene: with supervision;Sit to stand from 3-in-1/toilet ADL Goal: Toileting - Hygiene - Progress: Goal set today Miscellaneous OT Goals Miscellaneous OT Goal #1: Pt will state 3 of 3 back precautions with min verbal cues. OT Goal: Miscellaneous Goal #1 - Progress: Goal set today  Visit Information  Last OT Received On: 04/24/12 Assistance Needed: +2 (for safety) PT/OT Co-Evaluation/Treatment: Yes    Subjective Data  Subjective: "I had back surgery.' Patient Stated Goal: Family wants SNF for ST rehab.   Prior  Functioning     Home Living Lives With: Spouse;Other (Comment) (spouse is in SNF until Thurs.) Available Help at Discharge: Skilled Nursing Facility Home Access: Stairs to enter Entrance Stairs-Number of Steps: 1 Home Layout:  One level Bathroom Shower/Tub: Engineer, manufacturing systems: Standard Home Adaptive Equipment: Walker - rolling;Bedside commode/3-in-1;Shower chair with back Prior Function Level of Independence: Independent Comments: Pt is a poor historian due to delirium. Communication Communication: No difficulties Dominant Hand: Right         Vision/Perception Vision - History Baseline Vision: Wears glasses all the time   Cognition  Cognition Overall Cognitive Status: Impaired Area of Impairment: Memory;Safety/judgement;Awareness of errors Arousal/Alertness: Awake/alert Orientation Level:  (orientation was variable within the session) Behavior During Session: [facility] for tasks performed Memory: Decreased recall of precautions Memory Deficits: Pt will likely need repetition of precautions and safety due to delirium. Safety/Judgement: Decreased awareness of safety precautions (Was able to state appropriate use of her call button and dem) Awareness of Errors: Assistance required to identify errors made;Assistance required to correct errors made    Extremity/Trunk Assessment Right Upper Extremity Assessment RUE ROM/Strength/Tone: [facility] for tasks assessed RUE Coordination: WFL - gross/fine motor Left Upper Extremity Assessment LUE ROM/Strength/Tone: WFL for tasks assessed LUE Coordination: WFL - gross/fine motor Trunk Assessment Trunk Assessment: Normal     Mobility Bed Mobility Bed Mobility: Not assessed Transfers Transfers: Stand to Sit Stand to Sit: 4: Min assist;With upper extremity assist;To chair/3-in-1 Details for Transfer Assistance: verbal cues for technique and hand placement     Exercise     Balance     End of Session OT - End of  Session Activity Tolerance: Patient limited by fatigue;Other (comment) (pt with nausea following activity) Patient left: in chair;with call bell/phone within reach Nurse Communication: Mobility status (d/c recommendations)  GO     Evern Bio 04/24/2012, 11:25 AM [phone]

## 2012-04-24 NOTE — Evaluation (Signed)
Physical Therapy Evaluation Patient Details Name: [name] MRN: [id number] DOB: Jun 29, 1940 Today's Date: 04/24/2012 Time: 1020-1051 PT Time Calculation (min): 31 min  PT Assessment / Plan / Recommendation Clinical Impression  Pt is a 72 yo female presenting s/p posterior lumbar fusion with complications requiring prolonged bed rest. .  Pt's spouse currently in rehab for LE amputation. Pt unsafe to return home at this time due to no available assist and pt's inability to care for self at this time. Pt with intermittent confusion and requires assist for all mobility and Adls at this time. Pt to benefit from ST-SNF placement to address mentioned deficits and achieve mod I function prior to returning home.    PT Assessment  Patient needs continued PT services    Follow Up Recommendations  SNF;Supervision/Assistance - 24 hour    Does the patient have the potential to tolerate intense rehabilitation      Barriers to Discharge Decreased caregiver support      Equipment Recommendations  None recommended by PT    Recommendations for Other Services     Frequency Min 5X/week    Precautions / Restrictions Precautions Precautions: Back;Fall Precaution Booklet Issued: Yes (comment) Precaution Comments: Reviewed handout.  Restrictions Weight Bearing Restrictions: No   Pertinent Vitals/Pain Pt denies pain      Mobility  Bed Mobility Bed Mobility: Not assessed Rolling Right: 3: Mod assist;With rail Right Sidelying to Sit: 4: Min assist;With rails;HOB flat Details for Bed Mobility Assistance: max verbal and tactile directional cues to adhere to precautions Transfers Transfers: Sit to Stand;Stand to Sit Sit to Stand: 4: Min assist;With upper extremity assist;From bed Stand to Sit: 4: Min assist;With upper extremity assist;To chair/3-in-1 Details for Transfer Assistance: verbal cues for technique and hand placement Ambulation/Gait Ambulation/Gait Assistance: 4: Min  assist Ambulation Distance (Feet): 50 Feet Assistive device: Rolling walker Ambulation/Gait Assistance Details: max directional v/c's to stay on task, v/c's to achieve full upright postuer Gait Pattern: Step-through pattern;Decreased stride length Gait velocity: slow General Gait Details: slow moving, increased trunk flexion Stairs: No    Exercises     PT Diagnosis: Difficulty walking  PT Problem List: Decreased strength;Decreased balance;Decreased activity tolerance;Decreased mobility PT Treatment Interventions: Gait training;Functional mobility training;Therapeutic activities;Therapeutic exercise;Balance training   PT Goals Acute Rehab PT Goals PT Goal Formulation: With patient Time For Goal Achievement: 05/08/12 Potential to Achieve Goals: Good Pt will Roll Supine to Left Side: with supervision;with rail PT Goal: Rolling Supine to Left Side - Progress: Goal set today Pt will go Supine/Side to Sit: with supervision;with HOB 0 degrees PT Goal: Supine/Side to Sit - Progress: Goal set today Pt will go Sit to Supine/Side: with supervision;with HOB 0 degrees PT Goal: Sit to Supine/Side - Progress: Goal set today Pt will go Sit to Stand: with supervision;with upper extremity assist (up to RW) PT Goal: Sit to Stand - Progress: Goal set today Pt will Ambulate: >150 feet;with supervision;with rolling walker PT Goal: Ambulate - Progress: Goal set today Additional Goals Additional Goal #1: Pt able to recall 3/3 back precautions and be 100% compliant. PT Goal: Additional Goal #1 - Progress: Goal set today  Visit Information  Last PT Received On: 04/24/12 Assistance Needed: +2 (for safety) PT/OT Co-Evaluation/Treatment: Yes    Subjective Data  Subjective: Pt received supine in bed with confusion. Per RN pt with intermittent confusion.   Prior Functioning  Home Living Lives With: Spouse;Other (Comment) (spouse is in SNF until Thurs.) Available Help at Discharge: Skilled Nursing  Facility  Home Access: Stairs to enter Entrance Stairs-Number of Steps: 1 Home Layout: One level Bathroom Shower/Tub: Engineer, manufacturing systems: Standard Home Adaptive Equipment: Walker - rolling;Bedside commode/3-in-1;Shower chair with back Prior Function Level of Independence: Independent Comments: Pt is a poor historian due to delirium. Communication Communication: No difficulties Dominant Hand: Right    Cognition  Cognition: Pt with confusion at beginning of PT eval saying she was in [place] and was unaware she had back surgery but by the end of PT eval she was aware she was hospital and something happended during her back surgery. Overall Cognitive Status: Impaired Area of Impairment: Memory;Safety/judgement;Awareness of errors Arousal/Alertness: Awake/alert Orientation Level:  (orientation was variable within the session) Behavior During Session: [facility] for tasks performed Memory: Decreased recall of precautions Memory Deficits: Pt will likely need repetition of precautions and safety due to delirium. Safety/Judgement: Decreased awareness of safety precautions (Was able to state appropriate use of her call button and dem) Awareness of Errors: Assistance required to identify errors made;Assistance required to correct errors made    Extremity/Trunk Assessment Right Upper Extremity Assessment RUE ROM/Strength/Tone: [facility] for tasks assessed RUE Coordination: WFL - gross/fine motor Left Upper Extremity Assessment LUE ROM/Strength/Tone: WFL for tasks assessed LUE Coordination: WFL - gross/fine motor Right Lower Extremity Assessment RLE ROM/Strength/Tone: WFL for tasks assessed Left Lower Extremity Assessment LLE ROM/Strength/Tone: [facility] for tasks assessed Trunk Assessment Trunk Assessment: Normal   Balance    End of Session PT - End of Session Equipment Utilized During Treatment: Gait belt Activity Tolerance: Patient tolerated treatment well Patient left: in chair;with call  bell/phone within reach Nurse Communication: Mobility status  GP     Marcene Brawn 04/24/2012, 11:52 AM  Lewis Shock, PT, DPT Pager #: [phone] Office #: [phone]

## 2012-04-25 DIAGNOSIS — M4800 Spinal stenosis, site unspecified: Secondary | ICD-10-CM | POA: Diagnosis not present

## 2012-04-25 DIAGNOSIS — F329 Major depressive disorder, single episode, unspecified: Secondary | ICD-10-CM | POA: Diagnosis not present

## 2012-04-25 DIAGNOSIS — E876 Hypokalemia: Secondary | ICD-10-CM | POA: Diagnosis not present

## 2012-04-25 DIAGNOSIS — M549 Dorsalgia, unspecified: Secondary | ICD-10-CM | POA: Diagnosis not present

## 2012-04-25 DIAGNOSIS — F341 Dysthymic disorder: Secondary | ICD-10-CM | POA: Diagnosis not present

## 2012-04-25 DIAGNOSIS — M48061 Spinal stenosis, lumbar region without neurogenic claudication: Secondary | ICD-10-CM | POA: Diagnosis not present

## 2012-04-25 DIAGNOSIS — Z9889 Other specified postprocedural states: Secondary | ICD-10-CM | POA: Diagnosis not present

## 2012-04-25 DIAGNOSIS — M47817 Spondylosis without myelopathy or radiculopathy, lumbosacral region: Secondary | ICD-10-CM | POA: Diagnosis not present

## 2012-04-25 DIAGNOSIS — G43909 Migraine, unspecified, not intractable, without status migrainosus: Secondary | ICD-10-CM | POA: Diagnosis not present

## 2012-04-25 DIAGNOSIS — I10 Essential (primary) hypertension: Secondary | ICD-10-CM | POA: Diagnosis not present

## 2012-04-25 DIAGNOSIS — K5900 Constipation, unspecified: Secondary | ICD-10-CM | POA: Diagnosis not present

## 2012-04-25 DIAGNOSIS — E119 Type 2 diabetes mellitus without complications: Secondary | ICD-10-CM | POA: Diagnosis not present

## 2012-04-25 DIAGNOSIS — E878 Other disorders of electrolyte and fluid balance, not elsewhere classified: Secondary | ICD-10-CM | POA: Diagnosis not present

## 2012-04-25 DIAGNOSIS — E7800 Pure hypercholesterolemia, unspecified: Secondary | ICD-10-CM | POA: Diagnosis not present

## 2012-04-25 DIAGNOSIS — R269 Unspecified abnormalities of gait and mobility: Secondary | ICD-10-CM | POA: Diagnosis not present

## 2012-04-25 DIAGNOSIS — F411 Generalized anxiety disorder: Secondary | ICD-10-CM | POA: Diagnosis not present

## 2012-04-25 DIAGNOSIS — M5090 Cervical disc disorder, unspecified, unspecified cervical region: Secondary | ICD-10-CM | POA: Diagnosis not present

## 2012-04-25 DIAGNOSIS — M6281 Muscle weakness (generalized): Secondary | ICD-10-CM | POA: Diagnosis not present

## 2012-04-25 DIAGNOSIS — E860 Dehydration: Secondary | ICD-10-CM | POA: Diagnosis not present

## 2012-04-25 DIAGNOSIS — N390 Urinary tract infection, site not specified: Secondary | ICD-10-CM | POA: Diagnosis not present

## 2012-04-25 DIAGNOSIS — E538 Deficiency of other specified B group vitamins: Secondary | ICD-10-CM | POA: Diagnosis not present

## 2012-04-25 DIAGNOSIS — R262 Difficulty in walking, not elsewhere classified: Secondary | ICD-10-CM | POA: Diagnosis not present

## 2012-04-25 DIAGNOSIS — G2581 Restless legs syndrome: Secondary | ICD-10-CM | POA: Diagnosis not present

## 2012-04-25 DIAGNOSIS — E559 Vitamin D deficiency, unspecified: Secondary | ICD-10-CM | POA: Diagnosis not present

## 2012-04-25 DIAGNOSIS — IMO0002 Reserved for concepts with insufficient information to code with codable children: Secondary | ICD-10-CM

## 2012-04-25 DIAGNOSIS — E785 Hyperlipidemia, unspecified: Secondary | ICD-10-CM | POA: Diagnosis not present

## 2012-04-25 DIAGNOSIS — Z4889 Encounter for other specified surgical aftercare: Secondary | ICD-10-CM | POA: Diagnosis not present

## 2012-04-25 DIAGNOSIS — R489 Unspecified symbolic dysfunctions: Secondary | ICD-10-CM | POA: Diagnosis not present

## 2012-04-25 LAB — GLUCOSE, CAPILLARY: Glucose-Capillary: 209 mg/dL — ABNORMAL HIGH (ref 70–99)

## 2012-04-25 MED ORDER — HYDROCODONE-ACETAMINOPHEN 5-325 MG PO TABS: 1.0000 | ORAL_TABLET | Freq: Four times a day (QID) | ORAL | Status: DC | PRN

## 2012-04-25 MED ORDER — CYCLOBENZAPRINE HCL 10 MG PO TABS: 10.0000 mg | ORAL_TABLET | Freq: Three times a day (TID) | ORAL | Status: DC | PRN

## 2012-04-25 NOTE — Consult Note (Signed)
Physical Medicine and Rehabilitation Consult Reason for Consult: Lumbar stenosis with radiculopathy Referring Physician: Dr. Mikal Plane   HPI: [name] is a 72 y.o. right-handed female with history of diabetes mellitus with peripheral neuropathy. Patient admitted 04/18/2012 with low back pain radiating to the lower extremities that has progressed over the last couple of years. She finds herself having to use a walker or hold furniture to ambulate in the house. Patient lives with her husband who is currently in a skilled nursing facility after lower extremity amputation. X-rays and imaging revealed lumbar stenosis with radiculopathy L3-S1. Underwent lumbar laminectomy decompression microdiscectomy 3 levels 04/18/2012 per Dr. Mikal Plane. Postoperatively with bouts of confusion. Patient did have intermittent episodes of nausea with emesis and abdominal films unremarkable and did require bed rest for a short time. Physical and occupational therapy evaluations completed 04/24/2012. M.D. is requested physical medicine rehabilitation consult to consider inpatient rehabilitation services   Review of Systems  Gastrointestinal:       Reflux  Musculoskeletal: Positive for myalgias and back pain.  Neurological: Positive for headaches.       Restless legs  Psychiatric/Behavioral: Positive for depression.       Anxiety  All other systems reviewed and are negative.   Past Medical History  Diagnosis Date  . Anxiety   . Dyspnea   . Diabetes mellitus   . Osteoarthritis   . Hypercholesteremia   . Depression   . RLS (restless legs syndrome)   . Stress   . PONV (postoperative nausea and vomiting)   . Headache   . HTN (hypertension)     dr Eden Emms   Past Surgical History  Procedure Laterality Date  . Colonoscopy    . Abdominal hysterectomy    . Lumbar laminectomy/decompression microdiscectomy N/A 04/18/2012    Procedure: LUMBAR LAMINECTOMY/DECOMPRESSION MICRODISCECTOMY 3 LEVELS;  Surgeon: Carmela Hurt, MD;  Location: [facility];  Service: Neurosurgery;  Laterality: N/A;  Lumbar three-four, lumbar four-five, lumbar five-sacral one decompression. Synovial cyst resection lumbar four-five   Family History  Problem Relation Age of Onset  . Diabetes     Social History:  reports that she has never smoked. She does not have any smokeless tobacco history on file. She reports that she does not drink alcohol or use illicit drugs. Allergies:  Allergies  Allergen Reactions  . Sulfonamide Derivatives Swelling   Medications Prior to Admission  Medication Sig Dispense Refill  . acetaminophen (TYLENOL) 500 MG tablet Take 1,000 mg by mouth every 6 (six) hours as needed for pain.      Marland Kitchen ALPRAZolam (XANAX) 0.5 MG tablet Take 0.5 mg by mouth 2 (two) times daily as needed for anxiety.       Marland Kitchen aspirin EC 81 MG tablet Take 81 mg by mouth daily.      . CYANOCOBALAMIN PO Take 1,000 mcg by mouth daily.      Marland Kitchen glipiZIDE (GLUCOTROL XL) 5 MG 24 hr tablet Take 5 mg by mouth daily.      Marland Kitchen losartan (COZAAR) 100 MG tablet Take 100 mg by mouth daily.      . metFORMIN (GLUCOPHAGE) 500 MG tablet Take 500 mg by mouth 2 (two) times daily with a meal.      . metoprolol (TOPROL-XL) 100 MG 24 hr tablet Take 100 mg by mouth daily.        . potassium chloride SA (K-DUR,KLOR-CON) 20 MEQ tablet Take 20 mEq by mouth daily.      . pramipexole (MIRAPEX) 0.25  MG tablet Take 0.25 mg by mouth 3 (three) times daily.      Marland Kitchen triamterene-hydrochlorothiazide (MAXZIDE-25) 37.5-25 MG per tablet Take 0.5 tablets by mouth daily.      . verapamil (CALAN-SR) 240 MG CR tablet Take 240 mg by mouth 2 (two) times daily.         Home: Home Living Lives With: Spouse;Other (Comment) (spouse is in SNF until Thurs.) Available Help at Discharge: Skilled Nursing Facility Home Access: Stairs to enter Entrance Stairs-Number of Steps: 1 Home Layout: One level Bathroom Shower/Tub: Engineer, manufacturing systems: Standard Home Adaptive  Equipment: Walker - rolling;Bedside commode/3-in-1;Shower chair with back  Functional History: Prior Function Comments: Pt is a poor historian due to delirium. Functional Status:  Mobility: Bed Mobility Bed Mobility: Not assessed Rolling Right: 3: Mod assist;With rail Right Sidelying to Sit: 4: Min assist;With rails;HOB flat Transfers Transfers: Sit to Stand;Stand to Sit Sit to Stand: 4: Min assist;With upper extremity assist;From bed Stand to Sit: 4: Min assist;With upper extremity assist;To chair/3-in-1 Ambulation/Gait Ambulation/Gait Assistance: 4: Min assist Ambulation Distance (Feet): 50 Feet Assistive device: Rolling walker Ambulation/Gait Assistance Details: max directional v/c's to stay on task, v/c's to achieve full upright postuer Gait Pattern: Step-through pattern;Decreased stride length Gait velocity: slow General Gait Details: slow moving, increased trunk flexion Stairs: No    ADL: ADL Eating/Feeding: Independent Where Assessed - Eating/Feeding: Chair Grooming: Wash/dry hands;Wash/dry face;Supervision/safety Where Assessed - Grooming: Supported sitting Upper Body Bathing: Minimal assistance Where Assessed - Upper Body Bathing: Unsupported sitting Lower Body Bathing: +1 Total assistance Where Assessed - Lower Body Bathing: Unsupported sitting;Supported sit to stand Upper Body Dressing: Minimal assistance Where Assessed - Upper Body Dressing: Unsupported sitting Lower Body Dressing: +1 Total assistance Where Assessed - Lower Body Dressing: Unsupported sitting;Supported sit to stand Equipment Used: Gait belt;Rolling walker Transfers/Ambulation Related to ADLs: Pt ambulating in hall with PT upon OTs arrival with min assist and RW with chair following for safety. ADL Comments: Pt likely to need a lot of repetition to adhere to back precautions due to cognitive status.  Cognition: Cognition Arousal/Alertness: Awake/alert Orientation Level: Oriented to  person;Oriented to place Cognition Overall Cognitive Status: Impaired Area of Impairment: Memory;Safety/judgement;Awareness of errors Arousal/Alertness: Awake/alert Orientation Level:  (orientation was variable within the session) Behavior During Session: [facility] for tasks performed Memory: Decreased recall of precautions Memory Deficits: Pt will likely need repetition of precautions and safety due to delirium. Safety/Judgement: Decreased awareness of safety precautions (Was able to state appropriate use of her call button and dem) Awareness of Errors: Assistance required to identify errors made;Assistance required to correct errors made  Blood pressure 134/70, pulse 58, temperature 98.7 F (37.1 C), temperature source Oral, resp. rate 17, height 5' 7.5" (1.715 m), weight 99.6 kg (219 lb 9.3 oz), SpO2 98.00%. Physical Exam  Vitals reviewed. HENT:  Head: Normocephalic.  Eyes: EOM are normal.  Neck: Normal range of motion. Neck supple. No thyromegaly present.  Cardiovascular: Normal rate and regular rhythm.   Pulmonary/Chest: Effort normal and breath sounds normal. No respiratory distress.  Abdominal: Soft. Bowel sounds are normal. She exhibits no distension.  Musculoskeletal: She exhibits no edema.  Low back appropriately tender.   Neurological: She is alert.  Mood is flat but appropriate. She was able to state her name, age and date of birth. No focal motor or sensory deficits.  Skin: Skin is warm and dry.  Incision intact     Results for orders placed during the hospital encounter of 04/18/12 (from  the past 24 hour(s))  GLUCOSE, CAPILLARY     Status: Abnormal   Collection Time    04/24/12  6:47 AM      Result Value Range   Glucose-Capillary 182 (*) 70 - 99 mg/dL  GLUCOSE, CAPILLARY     Status: Abnormal   Collection Time    04/24/12 11:19 AM      Result Value Range   Glucose-Capillary 191 (*) 70 - 99 mg/dL  GLUCOSE, CAPILLARY     Status: Abnormal   Collection Time    04/24/12   4:29 PM      Result Value Range   Glucose-Capillary 300 (*) 70 - 99 mg/dL  GLUCOSE, CAPILLARY     Status: Abnormal   Collection Time    04/24/12  9:53 PM      Result Value Range   Glucose-Capillary 126 (*) 70 - 99 mg/dL   No results found.  Assessment/Plan: Diagnosis: lumbar stenosis with radic s/p decompression 1. Does the need for close, 24 hr/day medical supervision in concert with the patient's rehab needs make it unreasonable for this patient to be served in a less intensive setting? Potentially/no 2. Co-Morbidities requiring supervision/potential complications: htn, dm 3. Due to bladder management, bowel management, safety, skin/wound care, disease management and medication administration, does the patient require 24 hr/day rehab nursing? Potentially 4. Does the patient require coordinated care of a physician, rehab nurse, PT, OT to address physical and functional deficits in the context of the above medical diagnosis(es)? Yes Addressing deficits in the following areas: balance, endurance, locomotion, strength, transferring, bowel/bladder control, bathing, dressing and feeding 5. Can the patient actively participate in an intensive therapy program of at least 3 hrs of therapy per day at least 5 days per week? Potentially 6. The potential for patient to make measurable gains while on inpatient rehab is fair 7. Anticipated functional outcomes upon discharge from inpatient rehab are n/a with PT, n/a with OT, n/a with SLP. 8. Estimated rehab length of stay to reach the above functional goals is: n/a 9. Does the patient have adequate social supports to accommodate these discharge functional goals? No 10. Anticipated D/C setting: Home 11. Anticipated post D/C treatments: HH therapy 12. Overall Rehab/Functional Prognosis: excellent  RECOMMENDATIONS: This patient's condition is appropriate for continued rehabilitative care in the following setting: SNF Patient has agreed to participate in  recommended program. Potentially Note that insurance prior authorization may be required for reimbursement for recommended care.  Comment:Pt's husband is apparently on schedule to leave SNF (recent amputation) Thursday per pt. Unclear how much daughter can help. Furthermore, would have a hard time justifying medical need to stay on inpatient rehab.   Ranelle Oyster, MD, [place] Dom     04/25/2012

## 2012-04-25 NOTE — Progress Notes (Signed)
Physical Therapy Treatment Patient Details Name: ZAKARA PARKEY MRN: [id number] DOB: 03/17/1940 Today's Date: 04/25/2012 Time: 0141-0204 PT Time Calculation (min): 23 min  PT Assessment / Plan / Recommendation Comments on Treatment Session       Follow Up Recommendations  SNF;Supervision/Assistance - 24 hour     Does the patient have the potential to tolerate intense rehabilitation     Barriers to Discharge        Equipment Recommendations  None recommended by PT    Recommendations for Other Services    Frequency Min 5X/week   Plan Discharge plan remains appropriate    Precautions / Restrictions Precautions Precautions: Back;Fall Precaution Comments: Pt unable to recall any of the 3 back precautions.  Reviewed all 3.   Restrictions Weight Bearing Restrictions: No       Mobility  Bed Mobility Bed Mobility: Not assessed Transfers Transfers: Sit to Stand;Stand to Sit Sit to Stand: 4: Min assist;With upper extremity assist;With armrests;From chair/3-in-1 Stand to Sit: 4: Min assist;With upper extremity assist;With armrests;To chair/3-in-1 Details for Transfer Assistance: Max cues for hand placement.  (A) to position hands properly on armrests with sit>stand, to initiate standing up,   & guidance of hips to square herself up with recliner with stand>sit.   Ambulation/Gait Ambulation/Gait Assistance: 4: Min assist Ambulation Distance (Feet): 60 Feet Assistive device: Rolling walker Ambulation/Gait Assistance Details: Max cueing for safe use of RW, to stay on task, tall posture, & increase step length & step height.   (A) for balance & safety.  Pt with Rt lateral lean & becomes unsteady at times especially with turns.   Gait Pattern: Step-through pattern;Decreased step length - right;Decreased step length - left;Decreased stride length;Trunk flexed;Shuffle Gait velocity: slow General Gait Details: Pt fatigues quickly.  Easily distracted.  Increased flexion with increased  distance.   Stairs: No Wheelchair Mobility Wheelchair Mobility: No      PT Goals Acute Rehab PT Goals Time For Goal Achievement: 05/08/12 Potential to Achieve Goals: Good Pt will Roll Supine to Left Side: with supervision;with rail Pt will go Supine/Side to Sit: with supervision;with HOB 0 degrees Pt will go Sit to Supine/Side: with supervision;with HOB 0 degrees Pt will go Sit to Stand: with supervision;with upper extremity assist PT Goal: Sit to Stand - Progress: Progressing toward goal Pt will Ambulate: >150 feet;with supervision;with rolling walker PT Goal: Ambulate - Progress: Progressing toward goal Additional Goals Additional Goal #1: Pt able to recall 3/3 back precautions and be 100% compliant. PT Goal: Additional Goal #1 - Progress: Not met  Visit Information  Last PT Received On: 04/25/12 Assistance Needed: +1    Subjective Data      Cognition  Cognition Overall Cognitive Status: Impaired Area of Impairment: Awareness of errors;Problem solving Arousal/Alertness: Awake/alert Orientation Level: Appears intact for tasks assessed Behavior During Session: [facility] for tasks performed Memory: Decreased recall of precautions Awareness of Errors: Assistance required to identify errors made;Assistance required to correct errors made Awareness of Errors - Other Comments: Pt with Rt lateral lean & requiring min (A) for balance with ambulation but when asked pt is she was aware of leaning to Rt side she stated she wasnt.   Problem Solving: Pt slow to initiate movement & required max directional cues     Balance     End of Session PT - End of Session Equipment Utilized During Treatment: Gait belt Activity Tolerance: Patient tolerated treatment well;Patient limited by fatigue Patient left: in chair;with chair alarm set;with call bell/phone within  reach     [place], [place] [phone] 04/25/2012

## 2012-04-25 NOTE — Clinical Social Work Note (Signed)
Clinical Social Work   CSW met with pt and provided bed offers. Pt has chosen [facility]. CSW updated family, who are also agreeable. CSW confirmed with facility regarding bed availability, and they are able to accept pt. MD is aware. CSW will continue to follow to facilitate discharge today to [facility].   Dede Query, MSW, Theresia Majors [phone]

## 2012-04-25 NOTE — Clinical Social Work Placement (Addendum)
Clinical Social Work Department CLINICAL SOCIAL WORK PLACEMENT NOTE 04/25/2012  Patient:  SHANTA, HARTNER  Account Number:  [id number] Admit date:  04/18/2012  Clinical Social Worker:  Peggyann Shoals  Date/time:  04/25/2012 11:47 AM  Clinical Social Work is seeking post-discharge placement for this patient at the following level of care:   SKILLED NURSING   (*CSW will update this form in Epic as items are completed)   04/24/2012  Patient/family provided with [facility] Department of Clinical Social Work's list of facilities offering this level of care within the geographic area requested by the patient (or if unable, by the patient's family).  04/24/2012  Patient/family informed of their freedom to choose among providers that offer the needed level of care, that participate in Medicare, Medicaid or managed care program needed by the patient, have an available bed and are willing to accept the patient.  04/24/2012  Patient/family informed of [facility]' ownership interest in [facility], as well as of the fact that they are under no obligation to receive care at this facility.  PASARR submitted to EDS on 04/25/2012 PASARR number received from EDS on 04/25/2012  FL2 transmitted to all facilities in geographic area requested by pt/family on  04/24/2012 FL2 transmitted to all facilities within larger geographic area on   Patient informed that his/her managed care company has contracts with or will negotiate with  certain facilities, including the following:     Patient/family informed of bed offers received:  04/25/2012 Patient chooses bed at [facility]  Physician recommends and patient chooses bed at  SNF  Patient to be transferred to [facility] on 04/25/2012 Patient to be transferred to facility by [facility]  The following physician request were entered in Epic:   Additional Comments:  Dede Query, MSW, Theresia Majors [phone]

## 2012-04-25 NOTE — Discharge Summary (Signed)
Physician Discharge Summary  Patient ID: [name] MRN: [id number] DOB/AGE: 1940/10/13 72 y.o.  Admit date: 04/18/2012 Discharge date: 04/25/2012  Admission Diagnoses: Lumbar stenosis   Discharge Diagnoses:  Active Problems:   Spinal stenosis, lumbar region, with neurogenic claudication   Discharged Condition: good  Hospital Course: [name] was admitted to the hospital for a lumbar decompression. She did have a dural tear and was kept flat in bed for 72 hours. She did have nausea for a few days, this is now resolved as she is tolerating a regular diet. She is ambulating with assistance and still has a foley catheter. The wound is clean, dry and without signs of infection. The sutures should be removed early next week. She continues to need physical therapy. I expect continued improvement  Consults: none  Significant Diagnostic Studies: none  Treatments: surgery: LUMBAR LAMINECTOMY/DECOMPRESSION MICRODISCECTOMY 3 LEVELS  Complete laminectomy L4, L5, Hemilaminectomies L3, and S1  Synovial cyst resection right L4/5  Microdissection   Discharge Exam: Blood pressure 113/62, pulse 68, temperature 98 F (36.7 C), temperature source Oral, resp. rate 18, height 5' 7.5" (1.715 m), weight 99.6 kg (219 lb 9.3 oz), SpO2 98.00%. General appearance: alert, cooperative and appears stated age Neurologic: Alert and oriented X 3, normal strength and tone. Normal symmetric reflexes. Normal coordination and gait Incision/Wound: Clean and dry, no signs of infection  Disposition: 01-Home or Self Care     Medication List    TAKE these medications       acetaminophen 500 MG tablet  Commonly known as:  TYLENOL  Take 1,000 mg by mouth every 6 (six) hours as needed for pain.     ALPRAZolam 0.5 MG tablet  Commonly known as:  XANAX  Take 0.5 mg by mouth 2 (two) times daily as needed for anxiety.     aspirin EC 81 MG tablet  Take 81 mg by mouth daily.     CYANOCOBALAMIN PO  Take  1,000 mcg by mouth daily.     cyclobenzaprine 10 MG tablet  Commonly known as:  FLEXERIL  Take 1 tablet (10 mg total) by mouth 3 (three) times daily as needed for muscle spasms.     glipiZIDE 5 MG 24 hr tablet  Commonly known as:  GLUCOTROL XL  Take 5 mg by mouth daily.     HYDROcodone-acetaminophen 5-325 MG per tablet  Commonly known as:  NORCO/VICODIN  Take 1 tablet by mouth every 6 (six) hours as needed for pain.     losartan 100 MG tablet  Commonly known as:  COZAAR  Take 100 mg by mouth daily.     metFORMIN 500 MG tablet  Commonly known as:  GLUCOPHAGE  Take 500 mg by mouth 2 (two) times daily with a meal.     metoprolol succinate 100 MG 24 hr tablet  Commonly known as:  TOPROL-XL  Take 100 mg by mouth daily.     potassium chloride SA 20 MEQ tablet  Commonly known as:  K-DUR,KLOR-CON  Take 20 mEq by mouth daily.     pramipexole 0.25 MG tablet  Commonly known as:  MIRAPEX  Take 0.25 mg by mouth 3 (three) times daily.     triamterene-hydrochlorothiazide 37.5-25 MG per tablet  Commonly known as:  MAXZIDE-25  Take 0.5 tablets by mouth daily.     verapamil 240 MG CR tablet  Commonly known as:  CALAN-SR  Take 240 mg by mouth 2 (two) times daily.  Follow-up Information   Follow up with Milta Croson L, MD In 2 weeks. (after discharge from the skilled nursing facility)    Contact information:   [address]                         [address] [place] [address] [phone]       Signed: Dontavian Marchi L 04/25/2012, 2:41 PM

## 2012-04-25 NOTE — Progress Notes (Signed)
Rehab admissions - Evaluated for possible admission.  Please see rehab consult done today by Dr. Riley Kill.  Patient lacks the medical necessity to justify an acute inpatient rehab stay.  Recommendation is for SNF at this point.  Call me for questions.  #[phone]

## 2012-04-25 NOTE — Clinical Social Work Note (Signed)
Clinical Social Work   Pt is ready for discharge to [facility]. Facility has received discharge summary and ready to accept pt. Pt and family are agreeable to discharge plan. Pt will be transported by [facility]. CSW is signing off as no further needs identified.   Dede Query, MSW, Theresia Majors [phone]

## 2012-04-25 NOTE — Progress Notes (Signed)
Patient ID: [name], female   DOB: May 24, 1940, 72 y.o.   MRN: [id number] BP 113/62  Pulse 68  Temp(Src) 98 F (36.7 C) (Oral)  Resp 18  Ht 5' 7.5" (1.715 m)  Wt 99.6 kg (219 lb 9.3 oz)  BMI 33.86 kg/m2  SpO2 98% Alert and oriented x 4 Feeling better today, no nausea, no emesis Moving lower extremities well Wound is clean, dry, and without signs of infection.  May be discharged today.

## 2012-04-26 DIAGNOSIS — F341 Dysthymic disorder: Secondary | ICD-10-CM | POA: Diagnosis not present

## 2012-04-26 DIAGNOSIS — I10 Essential (primary) hypertension: Secondary | ICD-10-CM | POA: Diagnosis not present

## 2012-04-26 DIAGNOSIS — M549 Dorsalgia, unspecified: Secondary | ICD-10-CM | POA: Diagnosis not present

## 2012-04-26 DIAGNOSIS — E119 Type 2 diabetes mellitus without complications: Secondary | ICD-10-CM | POA: Diagnosis not present

## 2012-04-26 NOTE — Care Management Note (Signed)
    Page 1 of 1   04/26/2012     8:21:21 AM   CARE MANAGEMENT NOTE 04/26/2012  Patient:  [name], [name]   Account Number:  [id number]  Date Initiated:  04/19/2012  Documentation initiated by:  [facility]  Subjective/Objective Assessment:   admitted postop lumbar laminectomy     Action/Plan:   Pt/OT evals-recommending SNF   Anticipated DC Date:  04/25/2012   Anticipated DC Plan:  SKILLED NURSING FACILITY  In-house referral  Clinical Social Worker      DC Planning Services  CM consult      Choice offered to / List presented to:             Status of service:  Completed, signed off Medicare Important Message given?   (If response is "NO", the following Medicare IM given date fields will be blank) Date Medicare IM given:   Date Additional Medicare IM given:    Discharge Disposition:  SKILLED NURSING FACILITY  Per UR Regulation:  Reviewed for med. necessity/level of care/duration of stay  If discussed at Long Length of Stay Meetings, dates discussed:   04/24/2012    Comments:  04/23/2012 1600 NCM provided pt with [facility] list to review. Will need orders for if dc home with [facility]. Isidoro Donning RN CCM Case Mgmt phone [phone]

## 2012-05-01 DIAGNOSIS — N390 Urinary tract infection, site not specified: Secondary | ICD-10-CM | POA: Diagnosis not present

## 2012-06-06 DIAGNOSIS — I10 Essential (primary) hypertension: Secondary | ICD-10-CM | POA: Diagnosis not present

## 2012-06-06 DIAGNOSIS — F411 Generalized anxiety disorder: Secondary | ICD-10-CM | POA: Diagnosis not present

## 2012-06-06 DIAGNOSIS — M549 Dorsalgia, unspecified: Secondary | ICD-10-CM | POA: Diagnosis not present

## 2012-06-06 DIAGNOSIS — E119 Type 2 diabetes mellitus without complications: Secondary | ICD-10-CM | POA: Diagnosis not present

## 2012-06-20 DIAGNOSIS — I509 Heart failure, unspecified: Secondary | ICD-10-CM | POA: Diagnosis not present

## 2012-06-20 DIAGNOSIS — M4800 Spinal stenosis, site unspecified: Secondary | ICD-10-CM | POA: Diagnosis not present

## 2012-06-20 DIAGNOSIS — G8929 Other chronic pain: Secondary | ICD-10-CM | POA: Diagnosis not present

## 2012-06-20 DIAGNOSIS — R279 Unspecified lack of coordination: Secondary | ICD-10-CM | POA: Diagnosis not present

## 2012-06-20 DIAGNOSIS — I10 Essential (primary) hypertension: Secondary | ICD-10-CM | POA: Diagnosis not present

## 2012-06-20 DIAGNOSIS — E119 Type 2 diabetes mellitus without complications: Secondary | ICD-10-CM | POA: Diagnosis not present

## 2012-06-22 DIAGNOSIS — E119 Type 2 diabetes mellitus without complications: Secondary | ICD-10-CM | POA: Diagnosis not present

## 2012-06-22 DIAGNOSIS — I509 Heart failure, unspecified: Secondary | ICD-10-CM | POA: Diagnosis not present

## 2012-06-22 DIAGNOSIS — R279 Unspecified lack of coordination: Secondary | ICD-10-CM | POA: Diagnosis not present

## 2012-06-22 DIAGNOSIS — I10 Essential (primary) hypertension: Secondary | ICD-10-CM | POA: Diagnosis not present

## 2012-06-22 DIAGNOSIS — G8929 Other chronic pain: Secondary | ICD-10-CM | POA: Diagnosis not present

## 2012-06-22 DIAGNOSIS — M4800 Spinal stenosis, site unspecified: Secondary | ICD-10-CM | POA: Diagnosis not present

## 2012-06-25 DIAGNOSIS — I10 Essential (primary) hypertension: Secondary | ICD-10-CM | POA: Diagnosis not present

## 2012-06-25 DIAGNOSIS — G8929 Other chronic pain: Secondary | ICD-10-CM | POA: Diagnosis not present

## 2012-06-25 DIAGNOSIS — M4800 Spinal stenosis, site unspecified: Secondary | ICD-10-CM | POA: Diagnosis not present

## 2012-06-25 DIAGNOSIS — R279 Unspecified lack of coordination: Secondary | ICD-10-CM | POA: Diagnosis not present

## 2012-06-25 DIAGNOSIS — I509 Heart failure, unspecified: Secondary | ICD-10-CM | POA: Diagnosis not present

## 2012-06-25 DIAGNOSIS — E119 Type 2 diabetes mellitus without complications: Secondary | ICD-10-CM | POA: Diagnosis not present

## 2012-06-26 DIAGNOSIS — E119 Type 2 diabetes mellitus without complications: Secondary | ICD-10-CM | POA: Diagnosis not present

## 2012-06-26 DIAGNOSIS — M4800 Spinal stenosis, site unspecified: Secondary | ICD-10-CM | POA: Diagnosis not present

## 2012-06-26 DIAGNOSIS — R279 Unspecified lack of coordination: Secondary | ICD-10-CM | POA: Diagnosis not present

## 2012-06-26 DIAGNOSIS — I10 Essential (primary) hypertension: Secondary | ICD-10-CM | POA: Diagnosis not present

## 2012-06-26 DIAGNOSIS — I509 Heart failure, unspecified: Secondary | ICD-10-CM | POA: Diagnosis not present

## 2012-06-26 DIAGNOSIS — G8929 Other chronic pain: Secondary | ICD-10-CM | POA: Diagnosis not present

## 2012-06-28 DIAGNOSIS — G8929 Other chronic pain: Secondary | ICD-10-CM | POA: Diagnosis not present

## 2012-06-28 DIAGNOSIS — I10 Essential (primary) hypertension: Secondary | ICD-10-CM | POA: Diagnosis not present

## 2012-06-28 DIAGNOSIS — E119 Type 2 diabetes mellitus without complications: Secondary | ICD-10-CM | POA: Diagnosis not present

## 2012-06-28 DIAGNOSIS — R279 Unspecified lack of coordination: Secondary | ICD-10-CM | POA: Diagnosis not present

## 2012-06-28 DIAGNOSIS — I509 Heart failure, unspecified: Secondary | ICD-10-CM | POA: Diagnosis not present

## 2012-06-28 DIAGNOSIS — M4800 Spinal stenosis, site unspecified: Secondary | ICD-10-CM | POA: Diagnosis not present

## 2012-06-29 DIAGNOSIS — M4800 Spinal stenosis, site unspecified: Secondary | ICD-10-CM | POA: Diagnosis not present

## 2012-06-29 DIAGNOSIS — E119 Type 2 diabetes mellitus without complications: Secondary | ICD-10-CM | POA: Diagnosis not present

## 2012-06-29 DIAGNOSIS — I10 Essential (primary) hypertension: Secondary | ICD-10-CM | POA: Diagnosis not present

## 2012-06-29 DIAGNOSIS — G8929 Other chronic pain: Secondary | ICD-10-CM | POA: Diagnosis not present

## 2012-06-29 DIAGNOSIS — I509 Heart failure, unspecified: Secondary | ICD-10-CM | POA: Diagnosis not present

## 2012-06-29 DIAGNOSIS — R279 Unspecified lack of coordination: Secondary | ICD-10-CM | POA: Diagnosis not present

## 2012-07-02 DIAGNOSIS — R279 Unspecified lack of coordination: Secondary | ICD-10-CM | POA: Diagnosis not present

## 2012-07-02 DIAGNOSIS — G8929 Other chronic pain: Secondary | ICD-10-CM | POA: Diagnosis not present

## 2012-07-02 DIAGNOSIS — I10 Essential (primary) hypertension: Secondary | ICD-10-CM | POA: Diagnosis not present

## 2012-07-02 DIAGNOSIS — M4800 Spinal stenosis, site unspecified: Secondary | ICD-10-CM | POA: Diagnosis not present

## 2012-07-02 DIAGNOSIS — I509 Heart failure, unspecified: Secondary | ICD-10-CM | POA: Diagnosis not present

## 2012-07-02 DIAGNOSIS — E119 Type 2 diabetes mellitus without complications: Secondary | ICD-10-CM | POA: Diagnosis not present

## 2012-07-03 DIAGNOSIS — G8929 Other chronic pain: Secondary | ICD-10-CM | POA: Diagnosis not present

## 2012-07-03 DIAGNOSIS — R279 Unspecified lack of coordination: Secondary | ICD-10-CM | POA: Diagnosis not present

## 2012-07-03 DIAGNOSIS — I10 Essential (primary) hypertension: Secondary | ICD-10-CM | POA: Diagnosis not present

## 2012-07-03 DIAGNOSIS — M4800 Spinal stenosis, site unspecified: Secondary | ICD-10-CM | POA: Diagnosis not present

## 2012-07-03 DIAGNOSIS — I509 Heart failure, unspecified: Secondary | ICD-10-CM | POA: Diagnosis not present

## 2012-07-03 DIAGNOSIS — E119 Type 2 diabetes mellitus without complications: Secondary | ICD-10-CM | POA: Diagnosis not present

## 2012-07-04 DIAGNOSIS — I509 Heart failure, unspecified: Secondary | ICD-10-CM | POA: Diagnosis not present

## 2012-07-04 DIAGNOSIS — I10 Essential (primary) hypertension: Secondary | ICD-10-CM | POA: Diagnosis not present

## 2012-07-04 DIAGNOSIS — E119 Type 2 diabetes mellitus without complications: Secondary | ICD-10-CM | POA: Diagnosis not present

## 2012-07-04 DIAGNOSIS — R279 Unspecified lack of coordination: Secondary | ICD-10-CM | POA: Diagnosis not present

## 2012-07-04 DIAGNOSIS — G8929 Other chronic pain: Secondary | ICD-10-CM | POA: Diagnosis not present

## 2012-07-04 DIAGNOSIS — M4800 Spinal stenosis, site unspecified: Secondary | ICD-10-CM | POA: Diagnosis not present

## 2012-07-05 DIAGNOSIS — I509 Heart failure, unspecified: Secondary | ICD-10-CM | POA: Diagnosis not present

## 2012-07-05 DIAGNOSIS — M4800 Spinal stenosis, site unspecified: Secondary | ICD-10-CM | POA: Diagnosis not present

## 2012-07-05 DIAGNOSIS — G8929 Other chronic pain: Secondary | ICD-10-CM | POA: Diagnosis not present

## 2012-07-05 DIAGNOSIS — E119 Type 2 diabetes mellitus without complications: Secondary | ICD-10-CM | POA: Diagnosis not present

## 2012-07-05 DIAGNOSIS — R279 Unspecified lack of coordination: Secondary | ICD-10-CM | POA: Diagnosis not present

## 2012-07-05 DIAGNOSIS — I10 Essential (primary) hypertension: Secondary | ICD-10-CM | POA: Diagnosis not present

## 2012-07-06 DIAGNOSIS — G8929 Other chronic pain: Secondary | ICD-10-CM | POA: Diagnosis not present

## 2012-07-06 DIAGNOSIS — I509 Heart failure, unspecified: Secondary | ICD-10-CM | POA: Diagnosis not present

## 2012-07-06 DIAGNOSIS — M4800 Spinal stenosis, site unspecified: Secondary | ICD-10-CM | POA: Diagnosis not present

## 2012-07-06 DIAGNOSIS — I10 Essential (primary) hypertension: Secondary | ICD-10-CM | POA: Diagnosis not present

## 2012-07-06 DIAGNOSIS — E119 Type 2 diabetes mellitus without complications: Secondary | ICD-10-CM | POA: Diagnosis not present

## 2012-07-06 DIAGNOSIS — R279 Unspecified lack of coordination: Secondary | ICD-10-CM | POA: Diagnosis not present

## 2012-07-09 DIAGNOSIS — I509 Heart failure, unspecified: Secondary | ICD-10-CM | POA: Diagnosis not present

## 2012-07-09 DIAGNOSIS — I10 Essential (primary) hypertension: Secondary | ICD-10-CM | POA: Diagnosis not present

## 2012-07-09 DIAGNOSIS — G8929 Other chronic pain: Secondary | ICD-10-CM | POA: Diagnosis not present

## 2012-07-09 DIAGNOSIS — E119 Type 2 diabetes mellitus without complications: Secondary | ICD-10-CM | POA: Diagnosis not present

## 2012-07-09 DIAGNOSIS — M4800 Spinal stenosis, site unspecified: Secondary | ICD-10-CM | POA: Diagnosis not present

## 2012-07-09 DIAGNOSIS — R279 Unspecified lack of coordination: Secondary | ICD-10-CM | POA: Diagnosis not present

## 2012-07-10 DIAGNOSIS — I509 Heart failure, unspecified: Secondary | ICD-10-CM | POA: Diagnosis not present

## 2012-07-10 DIAGNOSIS — M4800 Spinal stenosis, site unspecified: Secondary | ICD-10-CM | POA: Diagnosis not present

## 2012-07-10 DIAGNOSIS — E119 Type 2 diabetes mellitus without complications: Secondary | ICD-10-CM | POA: Diagnosis not present

## 2012-07-10 DIAGNOSIS — R279 Unspecified lack of coordination: Secondary | ICD-10-CM | POA: Diagnosis not present

## 2012-07-10 DIAGNOSIS — G8929 Other chronic pain: Secondary | ICD-10-CM | POA: Diagnosis not present

## 2012-07-10 DIAGNOSIS — I10 Essential (primary) hypertension: Secondary | ICD-10-CM | POA: Diagnosis not present

## 2012-07-11 DIAGNOSIS — I509 Heart failure, unspecified: Secondary | ICD-10-CM | POA: Diagnosis not present

## 2012-07-11 DIAGNOSIS — I10 Essential (primary) hypertension: Secondary | ICD-10-CM | POA: Diagnosis not present

## 2012-07-11 DIAGNOSIS — M4800 Spinal stenosis, site unspecified: Secondary | ICD-10-CM | POA: Diagnosis not present

## 2012-07-11 DIAGNOSIS — E119 Type 2 diabetes mellitus without complications: Secondary | ICD-10-CM | POA: Diagnosis not present

## 2012-07-11 DIAGNOSIS — R279 Unspecified lack of coordination: Secondary | ICD-10-CM | POA: Diagnosis not present

## 2012-07-11 DIAGNOSIS — G8929 Other chronic pain: Secondary | ICD-10-CM | POA: Diagnosis not present

## 2012-07-12 DIAGNOSIS — G8929 Other chronic pain: Secondary | ICD-10-CM | POA: Diagnosis not present

## 2012-07-12 DIAGNOSIS — E119 Type 2 diabetes mellitus without complications: Secondary | ICD-10-CM | POA: Diagnosis not present

## 2012-07-12 DIAGNOSIS — I509 Heart failure, unspecified: Secondary | ICD-10-CM | POA: Diagnosis not present

## 2012-07-12 DIAGNOSIS — I10 Essential (primary) hypertension: Secondary | ICD-10-CM | POA: Diagnosis not present

## 2012-07-12 DIAGNOSIS — M4800 Spinal stenosis, site unspecified: Secondary | ICD-10-CM | POA: Diagnosis not present

## 2012-07-12 DIAGNOSIS — R279 Unspecified lack of coordination: Secondary | ICD-10-CM | POA: Diagnosis not present

## 2012-07-13 DIAGNOSIS — E119 Type 2 diabetes mellitus without complications: Secondary | ICD-10-CM | POA: Diagnosis not present

## 2012-07-13 DIAGNOSIS — R279 Unspecified lack of coordination: Secondary | ICD-10-CM | POA: Diagnosis not present

## 2012-07-13 DIAGNOSIS — I10 Essential (primary) hypertension: Secondary | ICD-10-CM | POA: Diagnosis not present

## 2012-07-13 DIAGNOSIS — I509 Heart failure, unspecified: Secondary | ICD-10-CM | POA: Diagnosis not present

## 2012-07-13 DIAGNOSIS — G8929 Other chronic pain: Secondary | ICD-10-CM | POA: Diagnosis not present

## 2012-07-13 DIAGNOSIS — M4800 Spinal stenosis, site unspecified: Secondary | ICD-10-CM | POA: Diagnosis not present

## 2012-07-18 DIAGNOSIS — G8929 Other chronic pain: Secondary | ICD-10-CM | POA: Diagnosis not present

## 2012-07-18 DIAGNOSIS — E119 Type 2 diabetes mellitus without complications: Secondary | ICD-10-CM | POA: Diagnosis not present

## 2012-07-18 DIAGNOSIS — I10 Essential (primary) hypertension: Secondary | ICD-10-CM | POA: Diagnosis not present

## 2012-07-18 DIAGNOSIS — I509 Heart failure, unspecified: Secondary | ICD-10-CM | POA: Diagnosis not present

## 2012-07-18 DIAGNOSIS — M4800 Spinal stenosis, site unspecified: Secondary | ICD-10-CM | POA: Diagnosis not present

## 2012-07-18 DIAGNOSIS — R279 Unspecified lack of coordination: Secondary | ICD-10-CM | POA: Diagnosis not present

## 2012-07-19 DIAGNOSIS — E119 Type 2 diabetes mellitus without complications: Secondary | ICD-10-CM | POA: Diagnosis not present

## 2012-07-19 DIAGNOSIS — M4800 Spinal stenosis, site unspecified: Secondary | ICD-10-CM | POA: Diagnosis not present

## 2012-07-19 DIAGNOSIS — I509 Heart failure, unspecified: Secondary | ICD-10-CM | POA: Diagnosis not present

## 2012-07-19 DIAGNOSIS — R279 Unspecified lack of coordination: Secondary | ICD-10-CM | POA: Diagnosis not present

## 2012-07-19 DIAGNOSIS — G8929 Other chronic pain: Secondary | ICD-10-CM | POA: Diagnosis not present

## 2012-07-19 DIAGNOSIS — I10 Essential (primary) hypertension: Secondary | ICD-10-CM | POA: Diagnosis not present

## 2012-07-20 DIAGNOSIS — M4800 Spinal stenosis, site unspecified: Secondary | ICD-10-CM | POA: Diagnosis not present

## 2012-07-20 DIAGNOSIS — E119 Type 2 diabetes mellitus without complications: Secondary | ICD-10-CM | POA: Diagnosis not present

## 2012-07-20 DIAGNOSIS — R279 Unspecified lack of coordination: Secondary | ICD-10-CM | POA: Diagnosis not present

## 2012-07-20 DIAGNOSIS — G8929 Other chronic pain: Secondary | ICD-10-CM | POA: Diagnosis not present

## 2012-07-20 DIAGNOSIS — I10 Essential (primary) hypertension: Secondary | ICD-10-CM | POA: Diagnosis not present

## 2012-07-20 DIAGNOSIS — I509 Heart failure, unspecified: Secondary | ICD-10-CM | POA: Diagnosis not present

## 2012-07-23 DIAGNOSIS — R279 Unspecified lack of coordination: Secondary | ICD-10-CM | POA: Diagnosis not present

## 2012-07-23 DIAGNOSIS — M4800 Spinal stenosis, site unspecified: Secondary | ICD-10-CM | POA: Diagnosis not present

## 2012-07-23 DIAGNOSIS — I509 Heart failure, unspecified: Secondary | ICD-10-CM | POA: Diagnosis not present

## 2012-07-23 DIAGNOSIS — I10 Essential (primary) hypertension: Secondary | ICD-10-CM | POA: Diagnosis not present

## 2012-07-23 DIAGNOSIS — E119 Type 2 diabetes mellitus without complications: Secondary | ICD-10-CM | POA: Diagnosis not present

## 2012-07-23 DIAGNOSIS — G8929 Other chronic pain: Secondary | ICD-10-CM | POA: Diagnosis not present

## 2012-07-25 DIAGNOSIS — M4800 Spinal stenosis, site unspecified: Secondary | ICD-10-CM | POA: Diagnosis not present

## 2012-07-25 DIAGNOSIS — E119 Type 2 diabetes mellitus without complications: Secondary | ICD-10-CM | POA: Diagnosis not present

## 2012-07-25 DIAGNOSIS — G8929 Other chronic pain: Secondary | ICD-10-CM | POA: Diagnosis not present

## 2012-07-25 DIAGNOSIS — I10 Essential (primary) hypertension: Secondary | ICD-10-CM | POA: Diagnosis not present

## 2012-07-25 DIAGNOSIS — R279 Unspecified lack of coordination: Secondary | ICD-10-CM | POA: Diagnosis not present

## 2012-07-25 DIAGNOSIS — I509 Heart failure, unspecified: Secondary | ICD-10-CM | POA: Diagnosis not present

## 2012-07-26 DIAGNOSIS — D510 Vitamin B12 deficiency anemia due to intrinsic factor deficiency: Secondary | ICD-10-CM | POA: Diagnosis not present

## 2012-07-26 DIAGNOSIS — E1139 Type 2 diabetes mellitus with other diabetic ophthalmic complication: Secondary | ICD-10-CM | POA: Diagnosis not present

## 2012-07-26 DIAGNOSIS — E785 Hyperlipidemia, unspecified: Secondary | ICD-10-CM | POA: Diagnosis not present

## 2012-07-26 DIAGNOSIS — Z1331 Encounter for screening for depression: Secondary | ICD-10-CM | POA: Diagnosis not present

## 2012-07-26 DIAGNOSIS — I739 Peripheral vascular disease, unspecified: Secondary | ICD-10-CM | POA: Diagnosis not present

## 2012-07-26 DIAGNOSIS — Z6831 Body mass index (BMI) 31.0-31.9, adult: Secondary | ICD-10-CM | POA: Diagnosis not present

## 2012-07-26 DIAGNOSIS — Z79899 Other long term (current) drug therapy: Secondary | ICD-10-CM | POA: Diagnosis not present

## 2012-07-27 DIAGNOSIS — R279 Unspecified lack of coordination: Secondary | ICD-10-CM | POA: Diagnosis not present

## 2012-07-27 DIAGNOSIS — I509 Heart failure, unspecified: Secondary | ICD-10-CM | POA: Diagnosis not present

## 2012-07-27 DIAGNOSIS — G8929 Other chronic pain: Secondary | ICD-10-CM | POA: Diagnosis not present

## 2012-07-27 DIAGNOSIS — I10 Essential (primary) hypertension: Secondary | ICD-10-CM | POA: Diagnosis not present

## 2012-07-27 DIAGNOSIS — M4800 Spinal stenosis, site unspecified: Secondary | ICD-10-CM | POA: Diagnosis not present

## 2012-07-27 DIAGNOSIS — E119 Type 2 diabetes mellitus without complications: Secondary | ICD-10-CM | POA: Diagnosis not present

## 2012-07-30 DIAGNOSIS — R279 Unspecified lack of coordination: Secondary | ICD-10-CM | POA: Diagnosis not present

## 2012-07-30 DIAGNOSIS — E119 Type 2 diabetes mellitus without complications: Secondary | ICD-10-CM | POA: Diagnosis not present

## 2012-07-30 DIAGNOSIS — M4800 Spinal stenosis, site unspecified: Secondary | ICD-10-CM | POA: Diagnosis not present

## 2012-07-30 DIAGNOSIS — I509 Heart failure, unspecified: Secondary | ICD-10-CM | POA: Diagnosis not present

## 2012-07-30 DIAGNOSIS — I10 Essential (primary) hypertension: Secondary | ICD-10-CM | POA: Diagnosis not present

## 2012-07-30 DIAGNOSIS — G8929 Other chronic pain: Secondary | ICD-10-CM | POA: Diagnosis not present

## 2012-08-01 DIAGNOSIS — G8929 Other chronic pain: Secondary | ICD-10-CM | POA: Diagnosis not present

## 2012-08-01 DIAGNOSIS — R279 Unspecified lack of coordination: Secondary | ICD-10-CM | POA: Diagnosis not present

## 2012-08-01 DIAGNOSIS — E119 Type 2 diabetes mellitus without complications: Secondary | ICD-10-CM | POA: Diagnosis not present

## 2012-08-01 DIAGNOSIS — I10 Essential (primary) hypertension: Secondary | ICD-10-CM | POA: Diagnosis not present

## 2012-08-01 DIAGNOSIS — M4800 Spinal stenosis, site unspecified: Secondary | ICD-10-CM | POA: Diagnosis not present

## 2012-08-01 DIAGNOSIS — I509 Heart failure, unspecified: Secondary | ICD-10-CM | POA: Diagnosis not present

## 2012-08-07 DIAGNOSIS — E119 Type 2 diabetes mellitus without complications: Secondary | ICD-10-CM | POA: Diagnosis not present

## 2012-08-07 DIAGNOSIS — G8929 Other chronic pain: Secondary | ICD-10-CM | POA: Diagnosis not present

## 2012-08-07 DIAGNOSIS — R279 Unspecified lack of coordination: Secondary | ICD-10-CM | POA: Diagnosis not present

## 2012-08-07 DIAGNOSIS — I509 Heart failure, unspecified: Secondary | ICD-10-CM | POA: Diagnosis not present

## 2012-08-07 DIAGNOSIS — M4800 Spinal stenosis, site unspecified: Secondary | ICD-10-CM | POA: Diagnosis not present

## 2012-08-07 DIAGNOSIS — I10 Essential (primary) hypertension: Secondary | ICD-10-CM | POA: Diagnosis not present

## 2012-08-09 DIAGNOSIS — I509 Heart failure, unspecified: Secondary | ICD-10-CM | POA: Diagnosis not present

## 2012-08-09 DIAGNOSIS — I10 Essential (primary) hypertension: Secondary | ICD-10-CM | POA: Diagnosis not present

## 2012-08-09 DIAGNOSIS — G8929 Other chronic pain: Secondary | ICD-10-CM | POA: Diagnosis not present

## 2012-08-09 DIAGNOSIS — E119 Type 2 diabetes mellitus without complications: Secondary | ICD-10-CM | POA: Diagnosis not present

## 2012-08-09 DIAGNOSIS — M4800 Spinal stenosis, site unspecified: Secondary | ICD-10-CM | POA: Diagnosis not present

## 2012-08-09 DIAGNOSIS — R279 Unspecified lack of coordination: Secondary | ICD-10-CM | POA: Diagnosis not present

## 2012-08-16 DIAGNOSIS — R279 Unspecified lack of coordination: Secondary | ICD-10-CM | POA: Diagnosis not present

## 2012-08-16 DIAGNOSIS — I10 Essential (primary) hypertension: Secondary | ICD-10-CM | POA: Diagnosis not present

## 2012-08-16 DIAGNOSIS — E119 Type 2 diabetes mellitus without complications: Secondary | ICD-10-CM | POA: Diagnosis not present

## 2012-08-16 DIAGNOSIS — I509 Heart failure, unspecified: Secondary | ICD-10-CM | POA: Diagnosis not present

## 2012-08-16 DIAGNOSIS — G8929 Other chronic pain: Secondary | ICD-10-CM | POA: Diagnosis not present

## 2012-08-16 DIAGNOSIS — M4800 Spinal stenosis, site unspecified: Secondary | ICD-10-CM | POA: Diagnosis not present

## 2012-09-11 DIAGNOSIS — M48061 Spinal stenosis, lumbar region without neurogenic claudication: Secondary | ICD-10-CM | POA: Diagnosis not present

## 2012-09-13 DIAGNOSIS — E119 Type 2 diabetes mellitus without complications: Secondary | ICD-10-CM | POA: Diagnosis not present

## 2012-11-12 DIAGNOSIS — D510 Vitamin B12 deficiency anemia due to intrinsic factor deficiency: Secondary | ICD-10-CM | POA: Diagnosis not present

## 2012-11-12 DIAGNOSIS — Z23 Encounter for immunization: Secondary | ICD-10-CM | POA: Diagnosis not present

## 2012-11-12 DIAGNOSIS — E1159 Type 2 diabetes mellitus with other circulatory complications: Secondary | ICD-10-CM | POA: Diagnosis not present

## 2012-11-12 DIAGNOSIS — R269 Unspecified abnormalities of gait and mobility: Secondary | ICD-10-CM | POA: Diagnosis not present

## 2012-11-12 DIAGNOSIS — Z79899 Other long term (current) drug therapy: Secondary | ICD-10-CM | POA: Diagnosis not present

## 2012-11-12 DIAGNOSIS — I739 Peripheral vascular disease, unspecified: Secondary | ICD-10-CM | POA: Diagnosis not present

## 2012-11-12 DIAGNOSIS — I10 Essential (primary) hypertension: Secondary | ICD-10-CM | POA: Diagnosis not present

## 2012-12-28 ENCOUNTER — Encounter: Payer: Self-pay | Admitting: Neurology

## 2012-12-28 ENCOUNTER — Ambulatory Visit (INDEPENDENT_AMBULATORY_CARE_PROVIDER_SITE_OTHER): Payer: Medicare Other | Admitting: Neurology

## 2012-12-28 VITALS — BP 153/78 | HR 76 | Ht 67.0 in | Wt 215.0 lb

## 2012-12-28 DIAGNOSIS — R269 Unspecified abnormalities of gait and mobility: Secondary | ICD-10-CM

## 2012-12-28 NOTE — Progress Notes (Addendum)
History of Present Illness:   [name] is a 72 yo right handed [place] American female is referred by Dr. Lequita Halt for bilateral lower extremity weakness, gait difficulty, accompanied by her daughter.     She has past medical history of diabetes, hypertension and right knee replacement surgery in August 2012.  She was able to ambulate without assistance before right knee replacement, now she has no right knee pain, but gradually worseing gait difficuty, she also developed urinary incontinence since mid 2013.  She was discharged to [facility] for 2 months following right knee replacement, after intense physical therapy, her walking has much improved, she was able to walk home without assitance.  After going home post surgerically, she fell, because misjudgement of the distance of a table, and fractured her left wrist.  But she recoved well, she remembered a car trip with her family around 01/2011, she was able to walk to a gas station without assistance, but her leg gave out underneath her without warning signs, she fell facedown, she denies excessive neck extension, But since then, she has slowing worsening gait dfificulty.   She now complaints  of mild low back pain, dragging her feet across the floor, she also developed urinary incontinence since mid 2013, her gait difficulty instead of getting better, is slightly getting worse.  She denies bilateral lower extremity paresthesia, she complains of bilateral lower extremity weakness.    MRI of the brain has demonstrated moderate atrophy, corresponding ventriculomegaly,periventricular small vessel disease,all chronic changes.  MRI of lumbar spine has demonstrated L3 -4 L5-S1 disc bulging, with facet hypertrophy, with mild spinal stenosis, moderate spinal stenosis, and moderate biforaminal stenosis. L4-5, disc bulging with facet hypertrophy, with moderate spinal stenosis, and moderate biforaminal stenosis.  MRI cervical spine: C4-5 disc  bulging with facet hypertrophy with flattening of thank you for, severe by foraminal stenosis, C5-6 and C6 7, disc bulging with facet hypertrophy with severe biforaminal stenosis, C3-4, disc bulging with mild right, and moderate left foraminal stenosis.  She had surgery by Dr. Franky Macho in Feb 2014 for lumbar stenosis lumbar spondylosis L3-S1,  LUMBAR LAMINECTOMY/DECOMPRESSION MICRODISCECTOMY 3 LEVELS, Complete laminectomy L4, L5, Hemilaminectomies L3, and S1 Synovial cyst resection right L4/5  Microdissection  The surgery did help low back pain, she still complains of mild low back pain, right knee pain, trouble walking, no improvement, actually mildly worse, unsteady gait,  She has urinary urgency, occasional bowel incontinence.   She lives with her daughter, her husband has an amputation,   She eats well, sleeps well, sometimes, she has restless night.    She also complains of    Review of Systems  Out of a complete 14 system review, the patient complains of only the following symptoms, and all other reviewed systems are negative.  Cardiovascular: Swelling in legs       Social History  Patient lives at home with her husband and is retired. Patient has a college education. She denies any history of tobacco , alcohol and illict drugs. Patient drinks caffeine. Inhaled Tobacco Use: never smoker  Family History  Patients parents are both deceased.  Past Medical History  High blood pressure Diabetes  Surgical History  Hysterectomy --1985    Physical Exam  General: Slightly anxious and tearful at end of visit Neck: supple no carotid bruits Respiratory: clear to auscultation bilaterally Cardiovascular: regular rate rhythm. left lower extremity with 2+ edema Musculoskeletal: No deformities  Neurologic Exam  Mental Status: sad looking elderly female, awake, alert, cooperative  to history, talking, and casual conversation, anxious when ambulating without walker.   Cranial Nerves: CN  II-XII pupils were equal round reactive to light.  Fundi were sharp bilaterally.  Extraocular movements were full.  Visual fields were full on confrontational test.  Facial sensation and strength were normal.  Hearing was intact to finger rubbing bilaterally.  Uvula tongue were midline.  Head turning and shoulder shrugging were normal and symmetric.  Tongue protrusion into the cheeks strength were normal.  Motor: Normal tone, bulk, and strength except bilateral hip flexion 4+/5 strength, mild right knee flexion weakness 4+. Sensory: Normal to light touch, pinprick, proprioception, and vibratory sensation. Coordination: Normal finger-to-nose, heel-to-shin.  There was no dysmetria noticed. Gait and Station: push up from seated position, able to take few steps without support with walker, but very cautious, dragging bilateral feet across the floor.  Romberg sign: Negative Reflexes: Deep tendon reflexes: Biceps: 2+/2+, Brachioradialis: 2+/2+, Triceps: 2+/2+, Pateller: 2/3, Achilles: 2/2  Plantar responses are flexor on left, extensor on right.   Assessment and Plan:  72 y/o right handed [place] American female with past medical history of diabetes, hypertension and right knee replacement in August 2012, has been referred by Dr. Lequita Halt for bilateral lower extremity weakness.with Lumbar stenosis, s/p decompression, without improving her gait difficulties.   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

## 2013-01-03 ENCOUNTER — Ambulatory Visit: Payer: Self-pay | Admitting: Neurology

## 2013-01-15 ENCOUNTER — Ambulatory Visit
Admission: RE | Admit: 2013-01-15 | Discharge: 2013-01-15 | Disposition: A | Discharge status: Home / Self Care | Payer: Medicare Other | Source: Ambulatory Visit | Attending: Neurology | Admitting: Neurology

## 2013-01-15 DIAGNOSIS — R269 Unspecified abnormalities of gait and mobility: Secondary | ICD-10-CM

## 2013-01-15 NOTE — Progress Notes (Signed)
Quick Note:  Please call patient, normal MRI thoracic spine ______

## 2013-02-22 DIAGNOSIS — E785 Hyperlipidemia, unspecified: Secondary | ICD-10-CM | POA: Diagnosis not present

## 2013-02-22 DIAGNOSIS — I10 Essential (primary) hypertension: Secondary | ICD-10-CM | POA: Diagnosis not present

## 2013-02-22 DIAGNOSIS — E559 Vitamin D deficiency, unspecified: Secondary | ICD-10-CM | POA: Diagnosis not present

## 2013-02-22 DIAGNOSIS — E1139 Type 2 diabetes mellitus with other diabetic ophthalmic complication: Secondary | ICD-10-CM | POA: Diagnosis not present

## 2013-02-22 DIAGNOSIS — D510 Vitamin B12 deficiency anemia due to intrinsic factor deficiency: Secondary | ICD-10-CM | POA: Diagnosis not present

## 2013-03-04 DIAGNOSIS — Z0000 Encounter for general adult medical examination without abnormal findings: Secondary | ICD-10-CM | POA: Diagnosis not present

## 2013-03-04 DIAGNOSIS — I10 Essential (primary) hypertension: Secondary | ICD-10-CM | POA: Diagnosis not present

## 2013-03-04 DIAGNOSIS — D510 Vitamin B12 deficiency anemia due to intrinsic factor deficiency: Secondary | ICD-10-CM | POA: Diagnosis not present

## 2013-03-04 DIAGNOSIS — E1139 Type 2 diabetes mellitus with other diabetic ophthalmic complication: Secondary | ICD-10-CM | POA: Diagnosis not present

## 2013-03-04 DIAGNOSIS — E1159 Type 2 diabetes mellitus with other circulatory complications: Secondary | ICD-10-CM | POA: Diagnosis not present

## 2013-03-04 DIAGNOSIS — R269 Unspecified abnormalities of gait and mobility: Secondary | ICD-10-CM | POA: Diagnosis not present

## 2013-03-04 DIAGNOSIS — G2581 Restless legs syndrome: Secondary | ICD-10-CM | POA: Diagnosis not present

## 2013-03-04 DIAGNOSIS — I739 Peripheral vascular disease, unspecified: Secondary | ICD-10-CM | POA: Diagnosis not present

## 2013-03-15 ENCOUNTER — Emergency Department (INDEPENDENT_AMBULATORY_CARE_PROVIDER_SITE_OTHER): Payer: Medicare Other

## 2013-03-15 ENCOUNTER — Emergency Department (HOSPITAL_COMMUNITY)
Admission: EM | Admit: 2013-03-15 | Discharge: 2013-03-16 | Disposition: A | Discharge status: Home / Self Care | Payer: Medicare Other | Attending: Emergency Medicine | Admitting: Emergency Medicine

## 2013-03-15 ENCOUNTER — Emergency Department (INDEPENDENT_AMBULATORY_CARE_PROVIDER_SITE_OTHER)
Admission: EM | Admit: 2013-03-15 | Discharge: 2013-03-15 | Disposition: A | Discharge status: Nursing Home (Includes ICF) | Payer: Medicare Other | Source: Home / Self Care | Attending: Family Medicine | Admitting: Family Medicine

## 2013-03-15 ENCOUNTER — Encounter (HOSPITAL_COMMUNITY): Payer: Self-pay | Admitting: Emergency Medicine

## 2013-03-15 DIAGNOSIS — F329 Major depressive disorder, single episode, unspecified: Secondary | ICD-10-CM | POA: Insufficient documentation

## 2013-03-15 DIAGNOSIS — E119 Type 2 diabetes mellitus without complications: Secondary | ICD-10-CM | POA: Diagnosis not present

## 2013-03-15 DIAGNOSIS — Z79899 Other long term (current) drug therapy: Secondary | ICD-10-CM | POA: Diagnosis not present

## 2013-03-15 DIAGNOSIS — M1990 Unspecified osteoarthritis, unspecified site: Secondary | ICD-10-CM | POA: Diagnosis not present

## 2013-03-15 DIAGNOSIS — R6889 Other general symptoms and signs: Secondary | ICD-10-CM | POA: Insufficient documentation

## 2013-03-15 DIAGNOSIS — F3289 Other specified depressive episodes: Secondary | ICD-10-CM | POA: Insufficient documentation

## 2013-03-15 DIAGNOSIS — F411 Generalized anxiety disorder: Secondary | ICD-10-CM | POA: Diagnosis not present

## 2013-03-15 DIAGNOSIS — G2581 Restless legs syndrome: Secondary | ICD-10-CM | POA: Insufficient documentation

## 2013-03-15 DIAGNOSIS — R0989 Other specified symptoms and signs involving the circulatory and respiratory systems: Secondary | ICD-10-CM

## 2013-03-15 DIAGNOSIS — Z87821 Personal history of retained foreign body fully removed: Secondary | ICD-10-CM

## 2013-03-15 DIAGNOSIS — E7800 Pure hypercholesterolemia, unspecified: Secondary | ICD-10-CM | POA: Diagnosis not present

## 2013-03-15 DIAGNOSIS — Z7982 Long term (current) use of aspirin: Secondary | ICD-10-CM | POA: Diagnosis not present

## 2013-03-15 DIAGNOSIS — I10 Essential (primary) hypertension: Secondary | ICD-10-CM | POA: Insufficient documentation

## 2013-03-15 DIAGNOSIS — R1310 Dysphagia, unspecified: Secondary | ICD-10-CM | POA: Diagnosis not present

## 2013-03-15 NOTE — ED Notes (Signed)
Reports having a fish dinner around 7:30/8 p.m.   Pt is c/o fish bone stuck in throat.  Pain/scratchy sensation with swallowing.  Pt has tried eating bread with no relief.   Pt is alert and oriented no signs of distress.

## 2013-03-15 NOTE — ED Notes (Signed)
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  [facility] was [facility].   The  Fish was perch.  [facility] was [facility] at [facility] that did not show any thing.  Unable to swallow

## 2013-03-15 NOTE — ED Provider Notes (Signed)
CSN: [id number]     Arrival date & time 03/15/13  1944 History   First MD Initiated Contact with Patient 03/15/13 2109     Chief Complaint  Patient presents with  . Swallowed Foreign Body   (Consider location/radiation/quality/duration/timing/severity/associated sxs/prior Treatment) HPI Comments: States she feels like she swallowed a fishbone while eating dinner tonight and has persistent discomfort at right side of throat near base of her neck. Denies difficulty breathing or speaking, just pain with swallowing.   The history is provided by the patient.    Past Medical History  Diagnosis Date  . Anxiety   . Dyspnea   . Diabetes mellitus   . Osteoarthritis   . Hypercholesteremia   . Depression   . RLS (restless legs syndrome)   . Stress   . PONV (postoperative nausea and vomiting)   . Headache(784.0)   . HTN (hypertension)     dr Johnsie Cancel   Past Surgical History  Procedure Laterality Date  . Colonoscopy    . Abdominal hysterectomy    . Lumbar laminectomy/decompression microdiscectomy N/A 04/18/2012    Procedure: LUMBAR LAMINECTOMY/DECOMPRESSION MICRODISCECTOMY 3 LEVELS;  Surgeon: Winfield Cunas, MD;  Location: [facility];  Service: Neurosurgery;  Laterality: N/A;  Lumbar three-four, lumbar four-five, lumbar five-sacral one decompression. Synovial cyst resection lumbar four-five   Family History  Problem Relation Age of Onset  . Diabetes Father   . High blood pressure Father   . High blood pressure Mother    History  Substance Use Topics  . Smoking status: Never Smoker   . Smokeless tobacco: Never Used  . Alcohol Use: No   OB History   Grav Para Term Preterm Abortions TAB SAB Ect Mult Living                 Review of Systems  All other systems reviewed and are negative.    Allergies  Sulfonamide derivatives  Home Medications   Current Outpatient Rx  Name  Route  Sig  Dispense  Refill  . ALPRAZolam (XANAX) 0.5 MG tablet   Oral   Take 0.5 mg by mouth 2  (two) times daily as needed for anxiety.          Marland Kitchen aspirin EC 81 MG tablet   Oral   Take 81 mg by mouth daily.         . B Complex Vitamins (B-COMPLEX/B-12 PO)   Oral   Take by mouth daily.         Marland Kitchen glipiZIDE (GLUCOTROL XL) 5 MG 24 hr tablet   Oral   Take 5 mg by mouth 2 (two) times daily.          Marland Kitchen losartan (COZAAR) 100 MG tablet   Oral   Take 100 mg by mouth daily.         . metoprolol (TOPROL-XL) 100 MG 24 hr tablet   Oral   Take 100 mg by mouth daily.           . potassium chloride SA (K-DUR,KLOR-CON) 20 MEQ tablet   Oral   Take 20 mEq by mouth daily.         . pramipexole (MIRAPEX) 0.25 MG tablet   Oral   Take 0.25 mg by mouth 2 (two) times daily.          Marland Kitchen triamterene-hydrochlorothiazide (MAXZIDE-25) 37.5-25 MG per tablet   Oral   Take 0.5 tablets by mouth daily.         . verapamil (  CALAN-SR) 240 MG CR tablet   Oral   Take 240 mg by mouth 2 (two) times daily.          Marland Kitchen acetaminophen (TYLENOL) 500 MG tablet   Oral   Take 1,000 mg by mouth every 6 (six) hours as needed for pain.         . metFORMIN (GLUCOPHAGE) 500 MG tablet   Oral   Take 500 mg by mouth 2 (two) times daily with a meal.          BP 149/89  Pulse 76  Temp(Src) 98.6 F (37 C) (Oral)  Resp 16  SpO2 99% Physical Exam  Nursing note and vitals reviewed. Constitutional: She is oriented to person, place, and time. She appears well-developed and well-nourished. No distress.  HENT:  Head: Normocephalic and atraumatic.  Right Ear: External ear normal.  Left Ear: External ear normal.  Nose: Nose normal.  Mouth/Throat: Uvula is midline and mucous membranes are normal. No trismus in the jaw.    Eyes: Conjunctivae are normal.  Neck: Normal range of motion. Neck supple. No tracheal deviation present. No thyromegaly present.  Cardiovascular: Normal rate, regular rhythm and normal heart sounds.   Pulmonary/Chest: Effort normal and breath sounds normal. No stridor. She  has no wheezes.  Lymphadenopathy:    She has no cervical adenopathy.  Neurological: She is alert and oriented to person, place, and time.  Skin: Skin is warm and dry.  Psychiatric: She has a normal mood and affect. Her behavior is normal.    ED Course  Procedures (including critical care time) Labs Review Labs Reviewed - No data to display Imaging Review Dg Neck Soft Tissue  03/15/2013   CLINICAL DATA:  Swallowed foreign body. Feels like fish bone stuck in throat.  EXAM: NECK SOFT TISSUES - 1+ VIEW  COMPARISON:  None.  FINDINGS: There is no evidence of retropharyngeal soft tissue swelling or epiglottic enlargement. The cervical airway is unremarkable and no radio-opaque foreign body identified. There are moderate degenerative changes of the cervical spine.  IMPRESSION: No radiopaque foreign body seen.   Electronically Signed   By: Marin Olp M.D.   On: 03/15/2013 21:31    EKG Interpretation    Date/Time:    Ventricular Rate:    PR Interval:    QRS Duration:   QT Interval:    QTC Calculation:   R Axis:     Text Interpretation:              MDM  Radiographs without visible evidence of FB. Patient very concerned about possible retained FB. Advised her that the next step would be for her to have CT imaging of her neck. She discussed this with her daughter and they wish to be transferred to [facility] ER for further imaging.     [place], [place] 03/15/13 2206

## 2013-03-16 ENCOUNTER — Emergency Department (HOSPITAL_COMMUNITY): Payer: Medicare Other

## 2013-03-16 DIAGNOSIS — R6889 Other general symptoms and signs: Secondary | ICD-10-CM | POA: Diagnosis not present

## 2013-03-16 NOTE — ED Notes (Signed)
Pt transported to radiology.

## 2013-03-16 NOTE — ED Provider Notes (Signed)
CSN: [id number]     Arrival date & time 03/15/13  2200 History   First MD Initiated Contact with Patient 03/15/13 2355     Chief Complaint  Patient presents with  . fish bone in throat    (Consider location/radiation/quality/duration/timing/severity/associated sxs/prior Treatment) HPI This patient is an elderly woman who presents with a throat foreign body sensation. She says she feels like she has a bone lodged in her throat. Her symptoms started as she was eating fish. She was first evaluated abdomen was done urgent care Center where she had soft tissue neck films performed. The study was negative for foreign body. She was referred to the emergency department told that she needed a CT scan of her neck.  The patient has discomfort over the right hypopharyngeal region which is worse when she turns her head to the left or flexes her neck. She denies any difficulty swallowing or managing her secretions. No shortness of breath.  Past Medical History  Diagnosis Date  . Anxiety   . Dyspnea   . Diabetes mellitus   . Osteoarthritis   . Hypercholesteremia   . Depression   . RLS (restless legs syndrome)   . Stress   . PONV (postoperative nausea and vomiting)   . Headache(784.0)   . HTN (hypertension)     dr Johnsie Cancel   Past Surgical History  Procedure Laterality Date  . Colonoscopy    . Abdominal hysterectomy    . Lumbar laminectomy/decompression microdiscectomy N/A 04/18/2012    Procedure: LUMBAR LAMINECTOMY/DECOMPRESSION MICRODISCECTOMY 3 LEVELS;  Surgeon: Winfield Cunas, MD;  Location: [facility];  Service: Neurosurgery;  Laterality: N/A;  Lumbar three-four, lumbar four-five, lumbar five-sacral one decompression. Synovial cyst resection lumbar four-five   Family History  Problem Relation Age of Onset  . Diabetes Father   . High blood pressure Father   . High blood pressure Mother    History  Substance Use Topics  . Smoking status: Never Smoker   . Smokeless tobacco: Never Used  .  Alcohol Use: No   OB History   Grav Para Term Preterm Abortions TAB SAB Ect Mult Living                 Review of Systems Ten point review of symptoms performed and is negative with the exception of symptoms noted above.   Allergies  Sulfonamide derivatives  Home Medications   Current Outpatient Rx  Name  Route  Sig  Dispense  Refill  . acetaminophen (TYLENOL) 500 MG tablet   Oral   Take 1,000 mg by mouth every 6 (six) hours as needed for pain.         Marland Kitchen ALPRAZolam (XANAX) 0.5 MG tablet   Oral   Take 0.5 mg by mouth 2 (two) times daily as needed for anxiety.          Marland Kitchen aspirin EC 81 MG tablet   Oral   Take 81 mg by mouth daily.         . B Complex Vitamins (B-COMPLEX/B-12 PO)   Oral   Take by mouth daily.         Marland Kitchen glipiZIDE (GLUCOTROL XL) 5 MG 24 hr tablet   Oral   Take 5 mg by mouth 2 (two) times daily.          Marland Kitchen losartan (COZAAR) 100 MG tablet   Oral   Take 100 mg by mouth daily.         . metFORMIN (GLUCOPHAGE) 500  MG tablet   Oral   Take 500 mg by mouth 2 (two) times daily with a meal.         . metoprolol (TOPROL-XL) 100 MG 24 hr tablet   Oral   Take 100 mg by mouth daily.           . potassium chloride SA (K-DUR,KLOR-CON) 20 MEQ tablet   Oral   Take 20 mEq by mouth daily.         . pramipexole (MIRAPEX) 0.25 MG tablet   Oral   Take 0.25 mg by mouth 2 (two) times daily.          Marland Kitchen triamterene-hydrochlorothiazide (MAXZIDE-25) 37.5-25 MG per tablet   Oral   Take 0.5 tablets by mouth daily.         . verapamil (CALAN-SR) 240 MG CR tablet   Oral   Take 240 mg by mouth 2 (two) times daily.           BP 140/93  Pulse 67  Temp(Src) 97.6 F (36.4 C) (Oral)  Resp 16  SpO2 95% Physical Exam Gen: well developed and well nourished appearing Head: NCAT Eyes: PERL, EOMI Nose: no epistaixis or rhinorrhea Mouth/throat: mucosa is moist and pink Neck: supple, no stridor Lungs: CTA B, no wheezing, rhonchi or rales CV: RRR, no  murmur, extremities appear well perfused.  Abd: soft, notender, nondistended Back: no ttp, no cva ttp Skin: warm and dry Ext: normal to inspection, no dependent edema Neuro: CN ii-xii grossly intact, no focal deficits Psyche; normal affect,  calm and cooperative.   ED Course  Procedures (including critical care time) Labs Review Labs Reviewed - No data to display Imaging Review Dg Neck Soft Tissue  03/15/2013   CLINICAL DATA:  Swallowed foreign body. Feels like fish bone stuck in throat.  EXAM: NECK SOFT TISSUES - 1+ VIEW  COMPARISON:  None.  FINDINGS: There is no evidence of retropharyngeal soft tissue swelling or epiglottic enlargement. The cervical airway is unremarkable and no radio-opaque foreign body identified. There are moderate degenerative changes of the cervical spine.  IMPRESSION: No radiopaque foreign body seen.   Electronically Signed   By: Marin Olp M.D.   On: 03/15/2013 21:31   CT Soft Tissue Neck Wo Contrast (Final result)  Result time: 03/16/13 01:21:56    Final result by Rad Results In Interface (03/16/13 01:21:56)    Narrative:   CLINICAL DATA: Evaluate for foreign body.  EXAM: CT NECK WITHOUT CONTRAST  TECHNIQUE: Multidetector CT imaging of the neck was performed following the standard protocol without intravenous contrast.  COMPARISON: None.  FINDINGS: No mass or adenopathy identified. No radio-opaque foreign bodies are identified. There is mild mucosal thickening involving the left maxillary sinus. The lung apices appear clear. Review of the visualized osseous structures is significant for multi level degenerative disc disease within the cervical spine.  IMPRESSION: 1. No radiopaque foreign bodies identified.   Electronically Signed By: Kerby Moors M.D. On: 03/16/2013 01:21      MDM  We have ruled out hypopharyngeal FB with CT soft tissue neck. Patient and dtr advised of suspected abrasion in this region as cause of pain. I have  recommended warm salt water gargles and tylenol for discomfort.     Elyn Peers, MD 03/16/13 [phone]

## 2013-03-16 NOTE — ED Provider Notes (Signed)
Medical screening examination/treatment/procedure(s) were performed by a resident physician or non-physician practitioner and as the supervising physician I was immediately available for consultation/collaboration.  Lynne Leader, MD    Gregor Hams, MD 03/16/13 [phone]

## 2013-03-21 ENCOUNTER — Ambulatory Visit: Payer: Medicare Other | Attending: Internal Medicine | Admitting: Physical Therapy

## 2013-03-21 DIAGNOSIS — IMO0001 Reserved for inherently not codable concepts without codable children: Secondary | ICD-10-CM | POA: Diagnosis not present

## 2013-03-21 DIAGNOSIS — M6281 Muscle weakness (generalized): Secondary | ICD-10-CM | POA: Insufficient documentation

## 2013-03-21 DIAGNOSIS — R5381 Other malaise: Secondary | ICD-10-CM | POA: Diagnosis not present

## 2013-03-21 DIAGNOSIS — R269 Unspecified abnormalities of gait and mobility: Secondary | ICD-10-CM | POA: Insufficient documentation

## 2013-03-26 ENCOUNTER — Ambulatory Visit: Payer: Medicare Other | Attending: Internal Medicine | Admitting: Physical Therapy

## 2013-03-26 DIAGNOSIS — IMO0001 Reserved for inherently not codable concepts without codable children: Secondary | ICD-10-CM | POA: Diagnosis not present

## 2013-03-26 DIAGNOSIS — M6281 Muscle weakness (generalized): Secondary | ICD-10-CM | POA: Insufficient documentation

## 2013-03-26 DIAGNOSIS — R5381 Other malaise: Secondary | ICD-10-CM | POA: Diagnosis not present

## 2013-03-26 DIAGNOSIS — R269 Unspecified abnormalities of gait and mobility: Secondary | ICD-10-CM | POA: Insufficient documentation

## 2013-03-28 ENCOUNTER — Ambulatory Visit: Payer: Medicare Other | Admitting: Physical Therapy

## 2013-03-28 DIAGNOSIS — IMO0001 Reserved for inherently not codable concepts without codable children: Secondary | ICD-10-CM | POA: Diagnosis not present

## 2013-03-28 DIAGNOSIS — M6281 Muscle weakness (generalized): Secondary | ICD-10-CM | POA: Diagnosis not present

## 2013-03-28 DIAGNOSIS — R5381 Other malaise: Secondary | ICD-10-CM | POA: Diagnosis not present

## 2013-03-28 DIAGNOSIS — R269 Unspecified abnormalities of gait and mobility: Secondary | ICD-10-CM | POA: Diagnosis not present

## 2013-04-02 ENCOUNTER — Ambulatory Visit: Payer: Medicare Other | Admitting: Physical Therapy

## 2013-04-04 ENCOUNTER — Ambulatory Visit: Payer: Medicare Other | Admitting: Physical Therapy

## 2013-04-09 ENCOUNTER — Ambulatory Visit: Payer: Medicare Other | Admitting: Physical Therapy

## 2013-04-11 ENCOUNTER — Ambulatory Visit: Payer: Medicare Other | Admitting: Physical Therapy

## 2013-04-11 DIAGNOSIS — IMO0001 Reserved for inherently not codable concepts without codable children: Secondary | ICD-10-CM | POA: Diagnosis not present

## 2013-04-11 DIAGNOSIS — R5381 Other malaise: Secondary | ICD-10-CM | POA: Diagnosis not present

## 2013-04-11 DIAGNOSIS — M6281 Muscle weakness (generalized): Secondary | ICD-10-CM | POA: Diagnosis not present

## 2013-04-11 DIAGNOSIS — R269 Unspecified abnormalities of gait and mobility: Secondary | ICD-10-CM | POA: Diagnosis not present

## 2013-04-16 ENCOUNTER — Ambulatory Visit: Payer: Medicare Other | Admitting: Physical Therapy

## 2013-04-18 ENCOUNTER — Ambulatory Visit: Payer: Medicare Other | Admitting: Physical Therapy

## 2013-04-19 ENCOUNTER — Ambulatory Visit: Payer: Medicare Other | Admitting: Physical Therapy

## 2013-04-19 DIAGNOSIS — IMO0001 Reserved for inherently not codable concepts without codable children: Secondary | ICD-10-CM | POA: Diagnosis not present

## 2013-04-19 DIAGNOSIS — M6281 Muscle weakness (generalized): Secondary | ICD-10-CM | POA: Diagnosis not present

## 2013-04-19 DIAGNOSIS — R269 Unspecified abnormalities of gait and mobility: Secondary | ICD-10-CM | POA: Diagnosis not present

## 2013-04-19 DIAGNOSIS — R5381 Other malaise: Secondary | ICD-10-CM | POA: Diagnosis not present

## 2013-04-23 ENCOUNTER — Ambulatory Visit: Payer: Medicare Other | Admitting: Physical Therapy

## 2013-04-23 ENCOUNTER — Ambulatory Visit: Payer: Medicare Other | Attending: Internal Medicine | Admitting: Physical Therapy

## 2013-04-23 DIAGNOSIS — IMO0001 Reserved for inherently not codable concepts without codable children: Secondary | ICD-10-CM | POA: Diagnosis not present

## 2013-04-23 DIAGNOSIS — R269 Unspecified abnormalities of gait and mobility: Secondary | ICD-10-CM | POA: Insufficient documentation

## 2013-04-23 DIAGNOSIS — M6281 Muscle weakness (generalized): Secondary | ICD-10-CM | POA: Insufficient documentation

## 2013-04-23 DIAGNOSIS — R5381 Other malaise: Secondary | ICD-10-CM | POA: Diagnosis not present

## 2013-04-25 ENCOUNTER — Ambulatory Visit: Payer: Medicare Other | Admitting: Physical Therapy

## 2013-04-29 ENCOUNTER — Encounter: Payer: Self-pay | Admitting: Neurology

## 2013-04-29 ENCOUNTER — Ambulatory Visit (INDEPENDENT_AMBULATORY_CARE_PROVIDER_SITE_OTHER): Payer: Medicare Other | Admitting: Neurology

## 2013-04-29 VITALS — BP 137/88 | HR 73 | Ht 67.0 in | Wt 208.0 lb

## 2013-04-29 DIAGNOSIS — R269 Unspecified abnormalities of gait and mobility: Secondary | ICD-10-CM | POA: Diagnosis not present

## 2013-04-29 DIAGNOSIS — Z8639 Personal history of other endocrine, nutritional and metabolic disease: Secondary | ICD-10-CM | POA: Diagnosis not present

## 2013-04-29 DIAGNOSIS — Z862 Personal history of diseases of the blood and blood-forming organs and certain disorders involving the immune mechanism: Secondary | ICD-10-CM

## 2013-04-29 DIAGNOSIS — F329 Major depressive disorder, single episode, unspecified: Secondary | ICD-10-CM

## 2013-04-29 DIAGNOSIS — M48062 Spinal stenosis, lumbar region with neurogenic claudication: Secondary | ICD-10-CM

## 2013-04-29 DIAGNOSIS — F341 Dysthymic disorder: Secondary | ICD-10-CM

## 2013-04-29 DIAGNOSIS — F3289 Other specified depressive episodes: Secondary | ICD-10-CM

## 2013-04-29 MED ORDER — CARBIDOPA-LEVODOPA 25-100 MG PO TABS: ORAL_TABLET | ORAL | Status: DC

## 2013-04-29 NOTE — Progress Notes (Signed)
History of Present Illness:   [name] is a 73 yo right handed [place] American female is referred by Dr. Wynelle Link for bilateral lower extremity weakness, gait difficulty, accompanied by her daughter.     She has past medical history of diabetes, hypertension and right knee replacement surgery in August 2012.  She was able to ambulate without assistance before right knee replacement, now she has no right knee pain, but gradually worseing gait difficuty, she also developed urinary incontinence since mid 2013.  She was discharged to [facility] for 2 months following right knee replacement, after intense physical therapy, her walking has much improved, she was able to walk home without assitance.  After going home post surgerically, she fell, because misjudgement of the distance of a table, and fractured her left wrist.  But she recoved well, she remembered a car trip with her family around 01/2011, she was able to walk to a gas station without assistance, but her leg gave out underneath her without warning signs, she fell facedown, she denies excessive neck extension, But since then, she has slowing worsening gait dfificulty.    She now complaints  of mild low back pain, dragging her feet across the floor, she also developed urinary incontinence since mid 2013, her gait difficulty instead of getting better, is slightly getting worse.  She denies bilateral lower extremity paresthesia, she complains of bilateral lower extremity weakness.    MRI of the brain has demonstrated moderate atrophy, corresponding ventriculomegaly,periventricular small vessel disease,all chronic changes.  MRI of lumbar spine has demonstrated L3 -4 L5-S1 disc bulging, with facet hypertrophy, with mild spinal stenosis, moderate spinal stenosis, and moderate biforaminal stenosis. L4-5, disc bulging with facet hypertrophy, with moderate spinal stenosis, and moderate biforaminal stenosis.  MRI cervical spine: C4-5 disc  bulging with facet hypertrophy with flattening of thank you for, severe by foraminal stenosis, C5-6 and C6 7, disc bulging with facet hypertrophy with severe biforaminal stenosis, C3-4, disc bulging with mild right, and moderate left foraminal stenosis.  MRI thoracic spine Chronic T12 endplate fractures are stable since 02/06/2012. Capacious thoracic spinal canal. No spinal stenosis  She had surgery by Dr. Christella Noa in Feb 2014 for lumbar stenosis lumbar spondylosis L3-S1,  LUMBAR LAMINECTOMY/DECOMPRESSION MICRODISCECTOMY 3 LEVELS, Complete laminectomy L4, L5, Hemilaminectomies L3, and S1 Synovial cyst resection right L4/5  Microdissection  The surgery did help low back pain, she still complains of mild low back pain, right knee pain, trouble walking, no improvement, actually mildly worse, unsteady gait,  She has urinary urgency, occasional bowel incontinence.   She lives with her daughter, her husband has an amputation,   She eats well, sleeps well, sometimes, she has restless night.    UPDATE March 9th 2015: She sits down most of the time now, continue to have low back pain, no shooting pain, she is receiving physical therapy.    Review of Systems  Out of a complete 14 system review, the patient complains of only the following symptoms, and all other reviewed systems are negative.  Cardiovascular: Swelling in legs      Social History  Patient lives at home with her husband and is retired. Patient has a college education. She denies any history of tobacco , alcohol and illict drugs. Patient drinks caffeine. Inhaled Tobacco Use: never smoker  Family History  Patients parents are both deceased.  Past Medical History  High blood pressure Diabetes  Surgical History  Hysterectomy --1985    Physical Exam  General: Slightly anxious and  tearful at end of visit Neck: supple no carotid bruits Respiratory: clear to auscultation bilaterally Cardiovascular: regular rate rhythm. left lower  extremity with 2+ edema Musculoskeletal: No deformities  Neurologic Exam  Mental Status: sad looking elderly female, awake, alert, cooperative to history, talking, and casual conversation, anxious when ambulating without walker.   Cranial Nerves: CN II-XII pupils were equal round reactive to light.  Fundi were sharp bilaterally.  Extraocular movements were full.  Visual fields were full on confrontational test.  Facial sensation and strength were normal.  Hearing was intact to finger rubbing bilaterally.  Uvula tongue were midline.  Head turning and shoulder shrugging were normal and symmetric.  Tongue protrusion into the cheeks strength were normal.  Motor:  bilateral hip flexion 4/5 strength, mild right knee flexion weakness 4+. Bilateral ankle dorsi flexion 4/4. Sensory: Normal to light touch, pinprick, proprioception, and vibratory sensation. Coordination: Normal finger-to-nose, heel-to-shin.  There was no dysmetria noticed. Gait and Station: push up from seated position, able to take few steps without support with walker, but very cautious, difficulty initiate the gait, dragging bilateral feet across the floor.  Romberg sign: Negative Reflexes: Deep tendon reflexes: Biceps: 2+/2+, Brachioradialis: 2+/2+, Triceps: 2+/2+, Pateller: 2/3, Achilles: Trace  Plantar responses are flexor on left, extensor on right.   Assessment and Plan:  73 y/o right handed [place] American female with past medical history of diabetes, hypertension and right knee replacement in August 2012, has been referred by Dr. Wynelle Link for bilateral lower extremity weakness.with Lumbar stenosis, s/p decompression, without improving her gait difficulties.   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 She will call clinic for progress report

## 2013-04-30 ENCOUNTER — Ambulatory Visit: Payer: Medicare Other | Admitting: Physical Therapy

## 2013-05-02 ENCOUNTER — Ambulatory Visit: Payer: Medicare Other | Admitting: Physical Therapy

## 2013-05-07 ENCOUNTER — Ambulatory Visit: Payer: Medicare Other | Admitting: Physical Therapy

## 2013-05-09 ENCOUNTER — Ambulatory Visit: Payer: Medicare Other | Admitting: Physical Therapy

## 2013-05-14 ENCOUNTER — Ambulatory Visit: Payer: Medicare Other | Admitting: Physical Therapy

## 2013-05-16 ENCOUNTER — Ambulatory Visit: Payer: Medicare Other | Admitting: Physical Therapy

## 2013-05-21 ENCOUNTER — Ambulatory Visit: Payer: Medicare Other | Admitting: Physical Therapy

## 2013-05-23 ENCOUNTER — Ambulatory Visit: Payer: Medicare Other | Attending: Internal Medicine | Admitting: Physical Therapy

## 2013-05-23 DIAGNOSIS — IMO0001 Reserved for inherently not codable concepts without codable children: Secondary | ICD-10-CM | POA: Insufficient documentation

## 2013-05-23 DIAGNOSIS — R269 Unspecified abnormalities of gait and mobility: Secondary | ICD-10-CM | POA: Insufficient documentation

## 2013-05-23 DIAGNOSIS — R5381 Other malaise: Secondary | ICD-10-CM | POA: Diagnosis not present

## 2013-05-23 DIAGNOSIS — M6281 Muscle weakness (generalized): Secondary | ICD-10-CM | POA: Diagnosis not present

## 2013-05-28 ENCOUNTER — Ambulatory Visit: Payer: Medicare Other | Admitting: Physical Therapy

## 2013-05-28 DIAGNOSIS — IMO0001 Reserved for inherently not codable concepts without codable children: Secondary | ICD-10-CM | POA: Diagnosis not present

## 2013-05-28 DIAGNOSIS — R5381 Other malaise: Secondary | ICD-10-CM | POA: Diagnosis not present

## 2013-05-28 DIAGNOSIS — R269 Unspecified abnormalities of gait and mobility: Secondary | ICD-10-CM | POA: Diagnosis not present

## 2013-05-28 DIAGNOSIS — M6281 Muscle weakness (generalized): Secondary | ICD-10-CM | POA: Diagnosis not present

## 2013-05-30 ENCOUNTER — Ambulatory Visit: Payer: Medicare Other | Admitting: Physical Therapy

## 2013-05-30 DIAGNOSIS — M6281 Muscle weakness (generalized): Secondary | ICD-10-CM | POA: Diagnosis not present

## 2013-05-30 DIAGNOSIS — R5381 Other malaise: Secondary | ICD-10-CM | POA: Diagnosis not present

## 2013-05-30 DIAGNOSIS — IMO0001 Reserved for inherently not codable concepts without codable children: Secondary | ICD-10-CM | POA: Diagnosis not present

## 2013-05-30 DIAGNOSIS — R269 Unspecified abnormalities of gait and mobility: Secondary | ICD-10-CM | POA: Diagnosis not present

## 2013-05-31 DIAGNOSIS — I10 Essential (primary) hypertension: Secondary | ICD-10-CM | POA: Diagnosis not present

## 2013-05-31 DIAGNOSIS — E119 Type 2 diabetes mellitus without complications: Secondary | ICD-10-CM | POA: Diagnosis not present

## 2013-05-31 DIAGNOSIS — M549 Dorsalgia, unspecified: Secondary | ICD-10-CM | POA: Diagnosis not present

## 2013-06-04 ENCOUNTER — Ambulatory Visit: Payer: Medicare Other | Admitting: Physical Therapy

## 2013-06-04 DIAGNOSIS — IMO0001 Reserved for inherently not codable concepts without codable children: Secondary | ICD-10-CM | POA: Diagnosis not present

## 2013-06-04 DIAGNOSIS — R5381 Other malaise: Secondary | ICD-10-CM | POA: Diagnosis not present

## 2013-06-04 DIAGNOSIS — R269 Unspecified abnormalities of gait and mobility: Secondary | ICD-10-CM | POA: Diagnosis not present

## 2013-06-04 DIAGNOSIS — M6281 Muscle weakness (generalized): Secondary | ICD-10-CM | POA: Diagnosis not present

## 2013-06-06 ENCOUNTER — Ambulatory Visit: Payer: Medicare Other | Admitting: Physical Therapy

## 2013-06-06 DIAGNOSIS — IMO0001 Reserved for inherently not codable concepts without codable children: Secondary | ICD-10-CM | POA: Diagnosis not present

## 2013-06-06 DIAGNOSIS — R5381 Other malaise: Secondary | ICD-10-CM | POA: Diagnosis not present

## 2013-06-06 DIAGNOSIS — M6281 Muscle weakness (generalized): Secondary | ICD-10-CM | POA: Diagnosis not present

## 2013-06-06 DIAGNOSIS — R269 Unspecified abnormalities of gait and mobility: Secondary | ICD-10-CM | POA: Diagnosis not present

## 2013-06-11 ENCOUNTER — Ambulatory Visit: Payer: Medicare Other | Admitting: Physical Therapy

## 2013-06-11 DIAGNOSIS — IMO0001 Reserved for inherently not codable concepts without codable children: Secondary | ICD-10-CM | POA: Diagnosis not present

## 2013-06-11 DIAGNOSIS — R5381 Other malaise: Secondary | ICD-10-CM | POA: Diagnosis not present

## 2013-06-11 DIAGNOSIS — R269 Unspecified abnormalities of gait and mobility: Secondary | ICD-10-CM | POA: Diagnosis not present

## 2013-06-11 DIAGNOSIS — M6281 Muscle weakness (generalized): Secondary | ICD-10-CM | POA: Diagnosis not present

## 2013-06-13 ENCOUNTER — Ambulatory Visit: Payer: Medicare Other | Admitting: Physical Therapy

## 2013-07-18 ENCOUNTER — Encounter (HOSPITAL_COMMUNITY): Payer: Self-pay | Admitting: *Deleted

## 2013-07-18 ENCOUNTER — Inpatient Hospital Stay (HOSPITAL_COMMUNITY)
Admission: AD | Admit: 2013-07-18 | Discharge: 2013-07-27 | DRG: 517 | Disposition: A | Discharge status: Skilled Nursing Facility | Payer: Medicare Other | Source: Ambulatory Visit | Attending: Internal Medicine | Admitting: Internal Medicine

## 2013-07-18 DIAGNOSIS — G2581 Restless legs syndrome: Secondary | ICD-10-CM | POA: Diagnosis present

## 2013-07-18 DIAGNOSIS — M549 Dorsalgia, unspecified: Secondary | ICD-10-CM | POA: Diagnosis not present

## 2013-07-18 DIAGNOSIS — F329 Major depressive disorder, single episode, unspecified: Secondary | ICD-10-CM | POA: Diagnosis present

## 2013-07-18 DIAGNOSIS — R269 Unspecified abnormalities of gait and mobility: Secondary | ICD-10-CM | POA: Diagnosis not present

## 2013-07-18 DIAGNOSIS — Z833 Family history of diabetes mellitus: Secondary | ICD-10-CM

## 2013-07-18 DIAGNOSIS — M47817 Spondylosis without myelopathy or radiculopathy, lumbosacral region: Secondary | ICD-10-CM | POA: Diagnosis not present

## 2013-07-18 DIAGNOSIS — E1139 Type 2 diabetes mellitus with other diabetic ophthalmic complication: Secondary | ICD-10-CM | POA: Diagnosis not present

## 2013-07-18 DIAGNOSIS — F411 Generalized anxiety disorder: Secondary | ICD-10-CM | POA: Diagnosis present

## 2013-07-18 DIAGNOSIS — E7800 Pure hypercholesterolemia, unspecified: Secondary | ICD-10-CM | POA: Diagnosis present

## 2013-07-18 DIAGNOSIS — S32009A Unspecified fracture of unspecified lumbar vertebra, initial encounter for closed fracture: Secondary | ICD-10-CM | POA: Diagnosis not present

## 2013-07-18 DIAGNOSIS — S0990XA Unspecified injury of head, initial encounter: Secondary | ICD-10-CM | POA: Diagnosis not present

## 2013-07-18 DIAGNOSIS — R52 Pain, unspecified: Secondary | ICD-10-CM | POA: Diagnosis present

## 2013-07-18 DIAGNOSIS — M545 Low back pain, unspecified: Secondary | ICD-10-CM | POA: Diagnosis not present

## 2013-07-18 DIAGNOSIS — E869 Volume depletion, unspecified: Secondary | ICD-10-CM | POA: Diagnosis present

## 2013-07-18 DIAGNOSIS — M48061 Spinal stenosis, lumbar region without neurogenic claudication: Secondary | ICD-10-CM | POA: Diagnosis not present

## 2013-07-18 DIAGNOSIS — F3289 Other specified depressive episodes: Secondary | ICD-10-CM | POA: Diagnosis present

## 2013-07-18 DIAGNOSIS — Z9181 History of falling: Secondary | ICD-10-CM | POA: Diagnosis not present

## 2013-07-18 DIAGNOSIS — R112 Nausea with vomiting, unspecified: Secondary | ICD-10-CM | POA: Diagnosis present

## 2013-07-18 DIAGNOSIS — Z79899 Other long term (current) drug therapy: Secondary | ICD-10-CM | POA: Diagnosis not present

## 2013-07-18 DIAGNOSIS — R262 Difficulty in walking, not elsewhere classified: Secondary | ICD-10-CM | POA: Diagnosis not present

## 2013-07-18 DIAGNOSIS — E119 Type 2 diabetes mellitus without complications: Secondary | ICD-10-CM | POA: Diagnosis not present

## 2013-07-18 DIAGNOSIS — R0989 Other specified symptoms and signs involving the circulatory and respiratory systems: Secondary | ICD-10-CM

## 2013-07-18 DIAGNOSIS — K224 Dyskinesia of esophagus: Secondary | ICD-10-CM | POA: Diagnosis not present

## 2013-07-18 DIAGNOSIS — M5090 Cervical disc disorder, unspecified, unspecified cervical region: Secondary | ICD-10-CM | POA: Diagnosis not present

## 2013-07-18 DIAGNOSIS — I10 Essential (primary) hypertension: Secondary | ICD-10-CM | POA: Diagnosis present

## 2013-07-18 DIAGNOSIS — R609 Edema, unspecified: Secondary | ICD-10-CM

## 2013-07-18 DIAGNOSIS — G309 Alzheimer's disease, unspecified: Secondary | ICD-10-CM | POA: Diagnosis present

## 2013-07-18 DIAGNOSIS — M48062 Spinal stenosis, lumbar region with neurogenic claudication: Secondary | ICD-10-CM

## 2013-07-18 DIAGNOSIS — IMO0002 Reserved for concepts with insufficient information to code with codable children: Secondary | ICD-10-CM | POA: Diagnosis not present

## 2013-07-18 DIAGNOSIS — R1110 Vomiting, unspecified: Secondary | ICD-10-CM | POA: Diagnosis not present

## 2013-07-18 DIAGNOSIS — F0280 Dementia in other diseases classified elsewhere without behavioral disturbance: Secondary | ICD-10-CM | POA: Diagnosis present

## 2013-07-18 DIAGNOSIS — R9431 Abnormal electrocardiogram [ECG] [EKG]: Secondary | ICD-10-CM

## 2013-07-18 DIAGNOSIS — E878 Other disorders of electrolyte and fluid balance, not elsewhere classified: Secondary | ICD-10-CM | POA: Diagnosis not present

## 2013-07-18 DIAGNOSIS — G43909 Migraine, unspecified, not intractable, without status migrainosus: Secondary | ICD-10-CM | POA: Diagnosis not present

## 2013-07-18 DIAGNOSIS — M810 Age-related osteoporosis without current pathological fracture: Secondary | ICD-10-CM | POA: Diagnosis present

## 2013-07-18 DIAGNOSIS — Z8639 Personal history of other endocrine, nutritional and metabolic disease: Secondary | ICD-10-CM

## 2013-07-18 DIAGNOSIS — E785 Hyperlipidemia, unspecified: Secondary | ICD-10-CM | POA: Diagnosis not present

## 2013-07-18 DIAGNOSIS — R489 Unspecified symbolic dysfunctions: Secondary | ICD-10-CM | POA: Diagnosis not present

## 2013-07-18 DIAGNOSIS — Z7982 Long term (current) use of aspirin: Secondary | ICD-10-CM | POA: Diagnosis not present

## 2013-07-18 DIAGNOSIS — M546 Pain in thoracic spine: Secondary | ICD-10-CM | POA: Diagnosis not present

## 2013-07-18 DIAGNOSIS — E559 Vitamin D deficiency, unspecified: Secondary | ICD-10-CM | POA: Diagnosis not present

## 2013-07-18 DIAGNOSIS — G8929 Other chronic pain: Secondary | ICD-10-CM | POA: Diagnosis present

## 2013-07-18 DIAGNOSIS — R110 Nausea: Secondary | ICD-10-CM | POA: Diagnosis not present

## 2013-07-18 DIAGNOSIS — R0609 Other forms of dyspnea: Secondary | ICD-10-CM

## 2013-07-18 DIAGNOSIS — Z862 Personal history of diseases of the blood and blood-forming organs and certain disorders involving the immune mechanism: Secondary | ICD-10-CM

## 2013-07-18 DIAGNOSIS — F341 Dysthymic disorder: Secondary | ICD-10-CM

## 2013-07-18 DIAGNOSIS — W19XXXA Unspecified fall, initial encounter: Secondary | ICD-10-CM | POA: Diagnosis present

## 2013-07-18 DIAGNOSIS — G8911 Acute pain due to trauma: Secondary | ICD-10-CM | POA: Diagnosis not present

## 2013-07-18 DIAGNOSIS — M6281 Muscle weakness (generalized): Secondary | ICD-10-CM | POA: Diagnosis not present

## 2013-07-18 DIAGNOSIS — I509 Heart failure, unspecified: Secondary | ICD-10-CM | POA: Diagnosis not present

## 2013-07-18 DIAGNOSIS — E860 Dehydration: Secondary | ICD-10-CM | POA: Diagnosis not present

## 2013-07-18 DIAGNOSIS — M1990 Unspecified osteoarthritis, unspecified site: Secondary | ICD-10-CM

## 2013-07-18 DIAGNOSIS — E538 Deficiency of other specified B group vitamins: Secondary | ICD-10-CM | POA: Diagnosis not present

## 2013-07-18 DIAGNOSIS — K5900 Constipation, unspecified: Secondary | ICD-10-CM | POA: Diagnosis not present

## 2013-07-18 DIAGNOSIS — E876 Hypokalemia: Secondary | ICD-10-CM | POA: Diagnosis not present

## 2013-07-18 DIAGNOSIS — R2681 Unsteadiness on feet: Secondary | ICD-10-CM | POA: Diagnosis present

## 2013-07-18 LAB — GLUCOSE, CAPILLARY
GLUCOSE-CAPILLARY: 161 mg/dL — AB (ref 70–99)
Glucose-Capillary: 119 mg/dL — ABNORMAL HIGH (ref 70–99)

## 2013-07-18 LAB — URINALYSIS, ROUTINE W REFLEX MICROSCOPIC
Bilirubin Urine: NEGATIVE
Glucose, UA: 100 mg/dL — AB
Ketones, ur: NEGATIVE mg/dL
Nitrite: NEGATIVE
Protein, ur: 30 mg/dL — AB
SPECIFIC GRAVITY, URINE: 1.017 (ref 1.005–1.030)
UROBILINOGEN UA: 1 mg/dL (ref 0.0–1.0)
pH: 6 (ref 5.0–8.0)

## 2013-07-18 LAB — COMPREHENSIVE METABOLIC PANEL
ALT: 12 U/L (ref 0–35)
AST: 14 U/L (ref 0–37)
Albumin: 3.6 g/dL (ref 3.5–5.2)
Alkaline Phosphatase: 63 U/L (ref 39–117)
BUN: 16 mg/dL (ref 6–23)
CALCIUM: 9.7 mg/dL (ref 8.4–10.5)
CO2: 28 mEq/L (ref 19–32)
Chloride: 106 mEq/L (ref 96–112)
Creatinine, Ser: 0.7 mg/dL (ref 0.50–1.10)
GFR, EST NON AFRICAN AMERICAN: 83 mL/min — AB (ref >90)
GLUCOSE: 98 mg/dL (ref 70–99)
Potassium: 3.6 mEq/L — ABNORMAL LOW (ref 3.7–5.3)
Sodium: 147 mEq/L (ref 137–147)
TOTAL PROTEIN: 7.3 g/dL (ref 6.0–8.3)
Total Bilirubin: 0.5 mg/dL (ref 0.3–1.2)

## 2013-07-18 LAB — URINE MICROSCOPIC-ADD ON

## 2013-07-18 LAB — CBC
HCT: 37.8 % (ref 36.0–46.0)
Hemoglobin: 12.7 g/dL (ref 12.0–15.0)
MCH: 27.3 pg (ref 26.0–34.0)
MCHC: 33.6 g/dL (ref 30.0–36.0)
MCV: 81.3 fL (ref 78.0–100.0)
PLATELETS: 208 10*3/uL (ref 150–400)
RBC: 4.65 MIL/uL (ref 3.87–5.11)
RDW: 14.4 % (ref 11.5–15.5)
WBC: 6.2 10*3/uL (ref 4.0–10.5)

## 2013-07-18 LAB — TSH: TSH: 1.18 u[IU]/mL (ref 0.350–4.500)

## 2013-07-18 MED ORDER — BISACODYL 10 MG RE SUPP: 10.0000 mg | Freq: Every day | RECTAL | Status: DC | PRN

## 2013-07-18 MED ORDER — HYDROMORPHONE HCL PF 1 MG/ML IJ SOLN: 0.5000 mg | INTRAMUSCULAR | Status: DC | PRN

## 2013-07-18 MED ORDER — POLYETHYLENE GLYCOL 3350 17 G PO PACK
17.0000 g | PACK | Freq: Every day | ORAL | Status: DC
  Administered 2013-07-18 – 2013-07-25 (×5): 17 g via ORAL
  Filled 2013-07-18 (×10): qty 1

## 2013-07-18 MED ORDER — GLIPIZIDE ER 5 MG PO TB24
5.0000 mg | ORAL_TABLET | Freq: Two times a day (BID) | ORAL | Status: DC
  Administered 2013-07-19 – 2013-07-27 (×16): 5 mg via ORAL
  Filled 2013-07-18 (×19): qty 1

## 2013-07-18 MED ORDER — VERAPAMIL HCL 80 MG PO TABS
80.0000 mg | ORAL_TABLET | Freq: Two times a day (BID) | ORAL | Status: DC
  Administered 2013-07-18 – 2013-07-27 (×18): 80 mg via ORAL
  Filled 2013-07-18 (×19): qty 1

## 2013-07-18 MED ORDER — SENNOSIDES-DOCUSATE SODIUM 8.6-50 MG PO TABS: 1.0000 | ORAL_TABLET | Freq: Every evening | ORAL | Status: DC | PRN

## 2013-07-18 MED ORDER — LOSARTAN POTASSIUM 50 MG PO TABS
100.0000 mg | ORAL_TABLET | Freq: Every day | ORAL | Status: DC
  Administered 2013-07-19 – 2013-07-27 (×9): 100 mg via ORAL
  Filled 2013-07-18 (×9): qty 2

## 2013-07-18 MED ORDER — HYDROCODONE-ACETAMINOPHEN 5-325 MG PO TABS
1.0000 | ORAL_TABLET | Freq: Four times a day (QID) | ORAL | Status: DC | PRN
  Administered 2013-07-18 – 2013-07-20 (×5): 1 via ORAL
  Filled 2013-07-18 (×5): qty 1

## 2013-07-18 MED ORDER — POTASSIUM CHLORIDE IN NACL 20-0.9 MEQ/L-% IV SOLN
INTRAVENOUS | Status: DC
  Administered 2013-07-18 – 2013-07-24 (×7): via INTRAVENOUS
  Filled 2013-07-18 (×13): qty 1000

## 2013-07-18 MED ORDER — BISACODYL 10 MG RE SUPP
10.0000 mg | Freq: Once | RECTAL | Status: AC
  Administered 2013-07-18: 10 mg via RECTAL
  Filled 2013-07-18: qty 1

## 2013-07-18 MED ORDER — ONDANSETRON HCL 4 MG/2ML IJ SOLN
4.0000 mg | Freq: Four times a day (QID) | INTRAMUSCULAR | Status: DC | PRN
  Administered 2013-07-20 – 2013-07-21 (×5): 4 mg via INTRAVENOUS
  Filled 2013-07-18 (×5): qty 2

## 2013-07-18 MED ORDER — ONDANSETRON HCL 4 MG PO TABS: 4.0000 mg | ORAL_TABLET | Freq: Four times a day (QID) | ORAL | Status: DC | PRN

## 2013-07-18 MED ORDER — SENNOSIDES-DOCUSATE SODIUM 8.6-50 MG PO TABS
1.0000 | ORAL_TABLET | Freq: Every morning | ORAL | Status: DC
  Administered 2013-07-19 – 2013-07-25 (×6): 1 via ORAL
  Filled 2013-07-18 (×9): qty 1

## 2013-07-18 MED ORDER — METOPROLOL TARTRATE 12.5 MG HALF TABLET
12.5000 mg | ORAL_TABLET | Freq: Two times a day (BID) | ORAL | Status: DC
  Administered 2013-07-18 – 2013-07-25 (×15): 12.5 mg via ORAL
  Filled 2013-07-18 (×17): qty 1

## 2013-07-18 MED ORDER — INSULIN ASPART 100 UNIT/ML ~~LOC~~ SOLN
0.0000 [IU] | Freq: Three times a day (TID) | SUBCUTANEOUS | Status: DC
Start: 2013-07-19 — End: 2013-07-27
  Administered 2013-07-19 – 2013-07-20 (×4): 3 [IU] via SUBCUTANEOUS
  Administered 2013-07-20: 5 [IU] via SUBCUTANEOUS
  Administered 2013-07-20 – 2013-07-21 (×2): 3 [IU] via SUBCUTANEOUS
  Administered 2013-07-21: 2 [IU] via SUBCUTANEOUS
  Administered 2013-07-21: 3 [IU] via SUBCUTANEOUS
  Administered 2013-07-22: 2 [IU] via SUBCUTANEOUS
  Administered 2013-07-22 – 2013-07-23 (×3): 3 [IU] via SUBCUTANEOUS
  Administered 2013-07-23: 2 [IU] via SUBCUTANEOUS
  Administered 2013-07-24: 8 [IU] via SUBCUTANEOUS
  Administered 2013-07-24: 2 [IU] via SUBCUTANEOUS
  Administered 2013-07-25: 5 [IU] via SUBCUTANEOUS
  Administered 2013-07-25: 8 [IU] via SUBCUTANEOUS
  Administered 2013-07-25: 3 [IU] via SUBCUTANEOUS
  Administered 2013-07-26 (×2): 5 [IU] via SUBCUTANEOUS
  Administered 2013-07-26 – 2013-07-27 (×2): 3 [IU] via SUBCUTANEOUS

## 2013-07-18 MED ORDER — PRAMIPEXOLE DIHYDROCHLORIDE 0.25 MG PO TABS
0.2500 mg | ORAL_TABLET | Freq: Two times a day (BID) | ORAL | Status: DC
  Administered 2013-07-18 – 2013-07-27 (×18): 0.25 mg via ORAL
  Filled 2013-07-18 (×21): qty 1

## 2013-07-18 MED ORDER — ACETAMINOPHEN 500 MG PO TABS
1000.0000 mg | ORAL_TABLET | Freq: Four times a day (QID) | ORAL | Status: DC | PRN
  Administered 2013-07-20 – 2013-07-26 (×7): 1000 mg via ORAL
  Filled 2013-07-18 (×7): qty 2

## 2013-07-18 MED ORDER — ALPRAZOLAM 0.5 MG PO TABS
0.5000 mg | ORAL_TABLET | Freq: Two times a day (BID) | ORAL | Status: DC | PRN
  Administered 2013-07-19 – 2013-07-27 (×11): 0.5 mg via ORAL
  Filled 2013-07-18 (×11): qty 1

## 2013-07-18 MED ORDER — ENOXAPARIN SODIUM 40 MG/0.4ML ~~LOC~~ SOLN
40.0000 mg | SUBCUTANEOUS | Status: DC
  Administered 2013-07-18 – 2013-07-26 (×8): 40 mg via SUBCUTANEOUS
  Filled 2013-07-18 (×10): qty 0.4

## 2013-07-18 MED ORDER — INSULIN ASPART 100 UNIT/ML ~~LOC~~ SOLN
0.0000 [IU] | Freq: Every day | SUBCUTANEOUS | Status: DC
  Administered 2013-07-23: 2 [IU] via SUBCUTANEOUS
  Administered 2013-07-24 – 2013-07-25 (×2): 3 [IU] via SUBCUTANEOUS
  Administered 2013-07-26: 2 [IU] via SUBCUTANEOUS

## 2013-07-18 NOTE — H&P (Signed)
[name] is an 73 y.o. female.   Chief Complaint: fell, now have severe back pain and cannot walk, having nausea and vomiting HPI:  The patient is a 73 year old woman with several medical problems who had a fall last Sunday despite using her walker when she fell backwards and hit the back side of her head as well as her low back.  She did not have a laceration or loss of consciousness.  She has had gradually increasing low back pain since the fall and now is unable to stand and walk because of the intense low back pain.  The pain does not radiate down her legs.  In addition, for the past 2-3 months she's had intermittent nausea and vomiting.  She has not had change in her vision, and is currently not having a headache or nausea.  She is also not had fever, chills, shortness of breath, abdominal pain, constipation, diarrhea, dysuria, or frequency.  She is being admitted to sort out the reason for her low back pain and gait instability. Past Medical History  Diagnosis Date  . Anxiety   . Dyspnea   . Diabetes mellitus   . Osteoarthritis   . Hypercholesteremia   . Depression   . RLS (restless legs syndrome)   . Stress   . PONV (postoperative nausea and vomiting)   . Headache(784.0)   . HTN (hypertension)     dr Johnsie Cancel    Medications Prior to Admission  Medication Sig Dispense Refill  . acetaminophen (TYLENOL) 500 MG tablet Take 1,000 mg by mouth every 6 (six) hours as needed for pain.      Marland Kitchen ALPRAZolam (XANAX) 0.5 MG tablet Take 0.5 mg by mouth 2 (two) times daily as needed for anxiety.       Marland Kitchen aspirin EC 81 MG tablet Take 81 mg by mouth daily.      . B Complex Vitamins (B-COMPLEX/B-12 PO) Take by mouth daily. Once a week.      Marland Kitchen glipiZIDE (GLUCOTROL XL) 5 MG 24 hr tablet Take 5 mg by mouth 2 (two) times daily.       Marland Kitchen HYDROcodone-acetaminophen (NORCO/VICODIN) 5-325 MG per tablet Take 1 tablet by mouth every 6 (six) hours as needed for moderate pain (pain).      Marland Kitchen losartan (COZAAR)  100 MG tablet Take 100 mg by mouth daily.      . metFORMIN (GLUCOPHAGE) 500 MG tablet Take 500 mg by mouth 2 (two) times daily with a meal.      . metoprolol (TOPROL-XL) 100 MG 24 hr tablet Take 100 mg by mouth daily.        . potassium chloride SA (K-DUR,KLOR-CON) 20 MEQ tablet Take 20 mEq by mouth daily.      . pramipexole (MIRAPEX) 0.25 MG tablet Take 0.25 mg by mouth 2 (two) times daily.       . verapamil (CALAN-SR) 240 MG CR tablet Take 240 mg by mouth 2 (two) times daily.         ADDITIONAL HOME MEDICATIONS: no additional medications  PHYSICIANS INVOLVED IN CARE: [name] (primary care), Jenkins Rouge (cardiology), Tedd Sias ( rheumatology), Dr. Collene Mares (GI), Gaynelle Arabian (ortho), Ashok Pall (nsurg)  Past Surgical History  Procedure Laterality Date  . Colonoscopy    . Abdominal hysterectomy    . Lumbar laminectomy/decompression microdiscectomy N/A 04/18/2012    Procedure: LUMBAR LAMINECTOMY/DECOMPRESSION MICRODISCECTOMY 3 LEVELS;  Surgeon: Winfield Cunas, MD;  Location: [facility];  Service: Neurosurgery;  Laterality:  N/A;  Lumbar three-four, lumbar four-five, lumbar five-sacral one decompression. Synovial cyst resection lumbar four-five  . Joint replacement    . Left forearm fracture with orif Left     Family History  Problem Relation Age of Onset  . Diabetes Father   . High blood pressure Father   . High blood pressure Mother      Social History:  reports that she has never smoked. She has never used smokeless tobacco. She reports that she does not drink alcohol or use illicit drugs.  Allergies:  Allergies  Allergen Reactions  . Morphine And Related Other (See Comments)    hallucination   . Oxycodone Other (See Comments)    hallucination   . Sulfonamide Derivatives Swelling     ROS: ankle swelling, arthritis, diabetes, heart palpitation and high blood pressure  PHYSICAL EXAM: Blood pressure 138/91, pulse 77, temperature 98.9 F (37.2 C),  temperature source Oral, resp. rate 16, height 5\' 7"  (1.702 m), weight 98.9 kg (218 lb 0.6 oz), SpO2 92.00%. In general, the patient is an elderly African-American woman who grimaced with pain with any movement in her chair.  HEENT exam was within normal limits, neck was supple without jugular venous distention or carotid bruit, chest was clear to auscultation, heart had a regular rate and rhythm, abdomen had normal bowel sounds and no hepatosplenomegaly or tenderness, she had bilateral 1+ leg edema and bilateral 1+ pedal pulses.  Neurologic exam: She was alert and answered questions appropriately.  She was able to move all extremities reasonably well.  She had intact light touch sensation throughout, she had normal bilateral finger to nose testing, cranial nerves II through XII are normal, with gait assessment she required moderate one-person assistance to change from a sitting position to a standing position and could not do any steps at all because of the severity of her low back pain.  Spine percussion was without evidence of focal tenderness.  Results for orders placed during the hospital encounter of 07/18/13 (from the past 48 hour(s))  GLUCOSE, CAPILLARY     Status: Abnormal   Collection Time    07/18/13  5:40 PM      Result Value Ref Range   Glucose-Capillary 119 (*) 70 - 99 mg/dL   Comment 1 Documented in Chart     Comment 2 Notify RN    CBC     Status: None   Collection Time    07/18/13  6:21 PM      Result Value Ref Range   WBC 6.2  4.0 - 10.5 K/uL   RBC 4.65  3.87 - 5.11 MIL/uL   Hemoglobin 12.7  12.0 - 15.0 g/dL   HCT 37.8  36.0 - 46.0 %   MCV 81.3  78.0 - 100.0 fL   MCH 27.3  26.0 - 34.0 pg   MCHC 33.6  30.0 - 36.0 g/dL   RDW 14.4  11.5 - 15.5 %   Platelets 208  150 - 400 K/uL   No results found.   Assessment/Plan #1 Gait Instability and Low Back Pain: she's had chronic problems with gait instability for which she uses a walker.  She also has physical deconditioning.  We  will check x-rays of her thoracic spine and lumbar spine to see if she's had a compression fracture.  If there is evidence of a compression fracture then we will obtain further imaging to see if it is acute.  In addition, we will request physical therapy and occupational therapy evaluations. #  2 Nausea and vomiting: unclear cause.  Possibly from narcotic medication.  In 2012 she had a full workup that did not show any abnormalities.  We will check a GI series with small bowel follow-through to see if there is any evidence of obstruction or gastroparesis.  For now we will give her IV fluids as she is likely intravascularly volume depleted. #3 HTN: stable and we will reduce her usual medications somewhat since she is somewhat dry. #4 DM2: currently stable and we will use sliding scale insulin as needed.  [name] 07/18/2013, 7:02 PM

## 2013-07-19 ENCOUNTER — Inpatient Hospital Stay (HOSPITAL_COMMUNITY): Payer: Medicare Other

## 2013-07-19 DIAGNOSIS — R269 Unspecified abnormalities of gait and mobility: Secondary | ICD-10-CM | POA: Diagnosis not present

## 2013-07-19 DIAGNOSIS — K224 Dyskinesia of esophagus: Secondary | ICD-10-CM | POA: Diagnosis not present

## 2013-07-19 DIAGNOSIS — E1139 Type 2 diabetes mellitus with other diabetic ophthalmic complication: Secondary | ICD-10-CM | POA: Diagnosis not present

## 2013-07-19 DIAGNOSIS — I10 Essential (primary) hypertension: Secondary | ICD-10-CM | POA: Diagnosis not present

## 2013-07-19 DIAGNOSIS — IMO0002 Reserved for concepts with insufficient information to code with codable children: Secondary | ICD-10-CM | POA: Diagnosis not present

## 2013-07-19 DIAGNOSIS — M546 Pain in thoracic spine: Secondary | ICD-10-CM | POA: Diagnosis not present

## 2013-07-19 DIAGNOSIS — R112 Nausea with vomiting, unspecified: Secondary | ICD-10-CM | POA: Diagnosis not present

## 2013-07-19 DIAGNOSIS — M545 Low back pain: Secondary | ICD-10-CM | POA: Diagnosis not present

## 2013-07-19 LAB — GLUCOSE, CAPILLARY
GLUCOSE-CAPILLARY: 150 mg/dL — AB (ref 70–99)
GLUCOSE-CAPILLARY: 167 mg/dL — AB (ref 70–99)
GLUCOSE-CAPILLARY: 180 mg/dL — AB (ref 70–99)
Glucose-Capillary: 156 mg/dL — ABNORMAL HIGH (ref 70–99)
Glucose-Capillary: 184 mg/dL — ABNORMAL HIGH (ref 70–99)

## 2013-07-19 LAB — VITAMIN D 25 HYDROXY (VIT D DEFICIENCY, FRACTURES): VIT D 25 HYDROXY: 40 ng/mL (ref 30–89)

## 2013-07-19 NOTE — Evaluation (Signed)
Physical Therapy Evaluation Patient Details Name: [name] MRN: [id number] DOB: 09-Jul-1940 Today's Date: 07/19/2013   History of Present Illness    The patient is a 73 year old woman with several medical problems who had a fall last Sunday despite using her walker when she fell backwards and hit the back side of her head as well as her low back. She did not have a laceration or loss of consciousness. She has had gradually increasing low back pain since the fall and now is unable to stand and walk because of the intense low back pain. The pain does not radiate down her legs. In addition, for the past 2-3 months she's had intermittent nausea and vomiting   Clinical Impression  Pt pleasant and cooperative but limited by pain, balance deficits with ambulation and deconditioning.  Pt currently requires assist of 2 for safe mobility and would benefit from follow up rehab at SNF level to maximize IND and safety prior to return home with disabled husband.    Follow Up Recommendations SNF    Equipment Recommendations  None recommended by PT    Recommendations for Other Services OT consult     Precautions / Restrictions Precautions Precautions: Fall Precaution Comments: unstable with gait and incontinent of urine with activity      Mobility  Bed Mobility Overal bed mobility: Needs Assistance;+2 for physical assistance Bed Mobility: Rolling;Sidelying to Sit Rolling: Mod assist;+2 for physical assistance Sidelying to sit: Mod assist;+2 for physical assistance       General bed mobility comments: cues for log roll technique; physical assist to complete log roll and to bring from sidely to sit  Transfers Overall transfer level: Needs assistance Equipment used: Rolling walker (2 wheeled) Transfers: Sit to/from Stand Sit to Stand: Min assist;Mod assist         General transfer comment: cues for transition position, LE placement and use of UEs to self assist; transfers to/from  bed, to/from comode twice and to chair  Ambulation/Gait Ambulation/Gait assistance: +2 safety/equipment;Min assist;Mod assist Ambulation Distance (Feet): 22 Feet (and 5') Assistive device: Rolling walker (2 wheeled) Gait Pattern/deviations: Step-to pattern;Step-through pattern;Decreased step length - right;Decreased step length - left;Shuffle;Trunk flexed;Drifts right/left Gait velocity: decr   General Gait Details: cues for posture, position from RW; pt with mild instability all directions; multiple rests required 2* fatigue  Stairs            Wheelchair Mobility    Modified Rankin (Stroke Patients Only)       Balance                                             Pertinent Vitals/Pain 5/10; premed,     Home Living Family/patient expects to be discharged to:: Private residence Living Arrangements: Spouse/significant other;Children Available Help at Discharge: Family Type of Home: House Home Access: Level entry     Home Layout: One level Home Equipment: Environmental consultant - 2 wheels Additional Comments: has been living with daughter for 2 years. Has a reacher    Prior Function Level of Independence: Independent with assistive device(s)               Hand Dominance        Extremity/Trunk Assessment   Upper Extremity Assessment: Overall WFL for tasks assessed           Lower Extremity Assessment: Overall WFL for tasks  assessed         Communication   Communication: No difficulties  Cognition Arousal/Alertness: Awake/alert Behavior During Therapy: WFL for tasks assessed/performed Overall Cognitive Status: Within Functional Limits for tasks assessed                      General Comments      Exercises        Assessment/Plan    PT Assessment Patient needs continued PT services  PT Diagnosis Difficulty walking   PT Problem List Decreased strength;Decreased activity tolerance;Decreased balance;Decreased mobility;Decreased  knowledge of use of DME;Obesity;Pain  PT Treatment Interventions DME instruction;Gait training;Functional mobility training;Therapeutic activities;Therapeutic exercise;Balance training;Patient/family education   PT Goals (Current goals can be found in the Care Plan section) Acute Rehab PT Goals Patient Stated Goal: Home with husband PT Goal Formulation: With patient Time For Goal Achievement: 08/02/13 Potential to Achieve Goals: Good    Frequency Min 3X/week   Barriers to discharge        Co-evaluation               End of Session Equipment Utilized During Treatment: Gait belt Activity Tolerance: Patient tolerated treatment well Patient left: in chair;with call bell/phone within reach;with chair alarm set Nurse Communication: Mobility status;Other (comment) (incontinencex2)         Time: 1405-1500 PT Time Calculation (min): 55 min   Charges:   PT Evaluation $Initial PT Evaluation Tier I: 1 Procedure PT Treatments $Gait Training: 8-22 mins $Therapeutic Activity: 23-37 mins   PT G Codes:          Mathis Fare 07/19/2013, 3:28 PM

## 2013-07-19 NOTE — Progress Notes (Signed)
Clinical Social Work Department BRIEF PSYCHOSOCIAL ASSESSMENT 07/19/2013  Patient:  [name], [name]     Account Number:  [id number]     Admit date:  07/18/2013  Clinical Social Worker:  Ulyess Blossom  Date/Time:  07/19/2013 04:24 PM  Referred by:  Physician  Date Referred:  07/19/2013 Referred for  SNF Placement   Other Referral:   Interview type:  Patient Other interview type:   and patient daughter at bedside    PSYCHOSOCIAL DATA Living Status:  HUSBAND Admitted from facility:   Level of care:   Primary support name:  Hostetter Abdo/[phone] Primary support relationship to patient:  CHILD, ADULT Degree of support available:   strong    CURRENT CONCERNS Current Concerns  Post-Acute Placement   Other Concerns:    SOCIAL WORK ASSESSMENT / PLAN CSW received referral for New SNF.    CSW met with pt at bedside. CSW introduced self and explained role. Pt alert and oriented x 4, but forgetful during conversation. CSW discussed with pt that MD and PT recommending short term rehab at [facility]. Pt became tearful and stated that she really wanted to return home with pt husband, but explained that pt husband has medical issues of his own. Pt discussed that pt daughter lives with pt and pt husband, but works during the day. Pt shared that she had been to rehab at SNF in the past, but was hopeful to have therapy at home. CSW discussed with pt about concerns for safety at home. Pt stated that she would like CSW to speak to pt daughter to further discuss option of rehab at [facility].    CSW contacted pt daughter, Nelia Shi via telephone. CSW introduced self and explained role. CSW discussed with pt daughter that recommendation is for short term rehab at [facility]. Pt daughter stated that she did not feel that that recommendation could be made given that pt and pt family have not received the results from pt procedures this morning. CSW explained that PT and MD had notified pt about  recommendation for SNF and CSW was simply discussing option in order to get process started for pt and pt family to have options. Pt daughter stated that she wishes to receive the results from pt procedures this morning before agreeing to exploring rehab at SNF options. CSW expressed understanding and will ask weekend CSW to follow up with pt and pt daughter regarding disposition planning.    CSW to continue to follow.   Assessment/plan status:  Psychosocial Support/Ongoing Assessment of Needs Other assessment/ plan:   discharge planning   Information/referral to community resources:   Pending pt and pt daughter agreeable to initiation of SNF search.    PATIENT'S/FAMILY'S RESPONSE TO PLAN OF CARE: Pt alert and oriented x 4, but forgetful. Emotional support provided to pt as she became tearful discussing disposition plans. Pt daughter supportive and actively involved in care and pt daughter feels it would be most appropriate to await results from pt testing this morning before further exploring disposition option.    Alison Murray, MSW, [place] Work [phone]

## 2013-07-19 NOTE — Evaluation (Signed)
Occupational Therapy Evaluation Patient Details Name: [name] MRN: [id number] DOB: 1941/01/13 Today's Date: 07/19/2013    History of Present Illness Pt was admitted for low back pain; recently fall   Clinical Impression   This 73 year old female was admitted with low back pain and recent fall. She will benefit from skilled OT to increase safety and independence with adls with supervision to min A level goals in acute.  She currently needs min A for SPT to 3:1 commode and up to total A x 1 for LB adls.  Pt reports she was mod I prior to admission.      Follow Up Recommendations  SNF    Equipment Recommendations  None recommended by OT (pt has a 3:1 commode)    Recommendations for Other Services       Precautions / Restrictions Precautions Precautions: Fall Precaution Comments: unstable with gait and incontinent of urine with activity Restrictions Weight Bearing Restrictions: No      Mobility Bed Mobility Overal bed mobility: Needs Assistance;+2 for physical assistance Bed Mobility: Rolling;Sidelying to Sit Rolling: Mod assist;+2 for physical assistance Sidelying to sit: Mod assist;+2 for physical assistance       General bed mobility comments: cues for log roll technique; physical assist to complete log roll and to bring from sidely to sit  Transfers Overall transfer level: Needs assistance Equipment used: Rolling walker (2 wheeled) Transfers: Sit to/from Stand Sit to Stand: Min assist         General transfer comment: cues for hand placement    Balance                                            ADL Overall ADL's : Needs assistance/impaired     Grooming: Set up;Wash/dry hands;Wash/dry face;Sitting   Upper Body Bathing: Set up;Sitting   Lower Body Bathing: Moderate assistance;Sit to/from stand   Upper Body Dressing : Minimal assistance;Sitting   Lower Body Dressing: Maximal assistance;Sit to/from stand   Toilet Transfer:  Minimal assistance;BSC;Stand-pivot   Toileting- Clothing Manipulation and Hygiene: Maximal assistance;Sit to/from stand         General ADL Comments: transferred to 3:1 commode but pt incontinent x urine before she made transfer.  Cues for safety.  Pt weepy during eval.  Worried about not being home for husband who is an amputee.  She has a Secondary school teacher but has not used it for adls recently     Vision                     Perception     Praxis      Pertinent Vitals/Pain 8 to 9/10 back; repositioned. RN will bring pain medication when it is due.      Hand Dominance     Extremity/Trunk Assessment Upper Extremity Assessment Upper Extremity Assessment: Overall WFL for tasks assessed   Lower Extremity Assessment Lower Extremity Assessment: Overall WFL for tasks assessed       Communication Communication Communication: No difficulties   Cognition Arousal/Alertness: Awake/alert Behavior During Therapy: WFL for tasks assessed/performed Overall Cognitive Status: Within Functional Limits for tasks assessed                     General Comments       Exercises       Shoulder Instructions      Home  Living Family/patient expects to be discharged to:: Private residence Living Arrangements: Spouse/significant other;Children Available Help at Discharge: Family Type of Home: House Home Access: Level entry     [place]: One level     Bathroom Shower/Tub: Hospital doctor Toilet: Handicapped height     Home Equipment: Environmental consultant - 2 wheels   Additional Comments: has been living with daughter for 2 years. Has a reacher      Prior Functioning/Environment Level of Independence: Independent with assistive device(s)             OT Diagnosis: Generalized weakness;Acute pain   OT Problem List: Decreased strength;Decreased activity tolerance;Impaired balance (sitting and/or standing);Decreased safety awareness;Decreased knowledge of use of DME or  AE;Pain   OT Treatment/Interventions: Self-care/ADL training;Energy conservation;DME and/or AE instruction;Patient/family education;Balance training    OT Goals(Current goals can be found in the care plan section) Acute Rehab OT Goals Patient Stated Goal: Home with husband OT Goal Formulation: With patient Time For Goal Achievement: 08/02/13 Potential to Achieve Goals: Good ADL Goals Pt Will Perform Grooming: with supervision;standing Pt Will Perform Lower Body Bathing: with supervision;with adaptive equipment;sit to/from stand Pt Will Perform Lower Body Dressing: with supervision;with adaptive equipment;sit to/from stand Pt Will Transfer to Toilet: with min guard assist;ambulating;bedside commode Pt Will Perform Toileting - Clothing Manipulation and hygiene: with min assist;sit to/from stand  OT Frequency: Min 2X/week   Barriers to D/C:            Co-evaluation              End of Session Nurse Communication:  (incontinence)  Activity Tolerance: Patient limited by pain Patient left: in chair;with call bell/phone within reach;with chair alarm set   Time: 6734-1937 OT Time Calculation (min): 30 min Charges:  OT General Charges $OT Visit: 1 Procedure OT Evaluation $Initial OT Evaluation Tier I: 1 Procedure OT Treatments $Self Care/Home Management : 23-37 mins G-Codes:    Lesle Chris 06-Aug-2013, 3:59 PM  Lesle Chris, OTR/L [phone] 2013-08-06

## 2013-07-19 NOTE — Progress Notes (Signed)
Subjective: Slept well last night, no nausea or back pain now lying supine in bed  Objective: Vital signs in last 24 hours: Temp:  [98.3 F (36.8 C)-98.9 F (37.2 C)] 98.3 F (36.8 C) (05/29 0651) Pulse Rate:  [54-77] 54 (05/29 0651) Resp:  [16] 16 (05/29 0651) BP: (138-154)/(78-91) 143/82 mmHg (05/29 0651) SpO2:  [92 %-96 %] 96 % (05/29 0651) Weight:  [98.9 kg (218 lb 0.6 oz)] 98.9 kg (218 lb 0.6 oz) (05/28 1531) Weight change:    Intake/Output from previous day: 05/28 0701 - 05/29 0700 In: 872 [I.V.:872] Out: -    General appearance: alert, cooperative and no distress Resp: clear to auscultation bilaterally Cardio: regular rate and rhythm GI: soft, non-tender; bowel sounds normal; no masses,  no organomegaly Extremities: bilateral trace leg edema  Lab Results:  Recent Labs  07/18/13 1821  WBC 6.2  HGB 12.7  HCT 37.8  PLT 208   BMET  Recent Labs  07/18/13 1821  NA 147  K 3.6*  CL 106  CO2 28  GLUCOSE 98  BUN 16  CREATININE 0.70  CALCIUM 9.7   CMET CMP     Component Value Date/Time   NA 147 07/18/2013 1821   K 3.6* 07/18/2013 1821   CL 106 07/18/2013 1821   CO2 28 07/18/2013 1821   GLUCOSE 98 07/18/2013 1821   BUN 16 07/18/2013 1821   CREATININE 0.70 07/18/2013 1821   CALCIUM 9.7 07/18/2013 1821   PROT 7.3 07/18/2013 1821   ALBUMIN 3.6 07/18/2013 1821   AST 14 07/18/2013 1821   ALT 12 07/18/2013 1821   ALKPHOS 63 07/18/2013 1821   BILITOT 0.5 07/18/2013 1821   GFRNONAA 83* 07/18/2013 1821   GFRAA >90 07/18/2013 1821    CBG (last 3)   Recent Labs  07/18/13 1740 07/18/13 2144  GLUCAP 119* 161*    INR RESULTS:   Lab Results  Component Value Date   INR 1.01 10/07/2010     Studies/Results: No results found.  Medications: I have reviewed the patient's current medications.  Assessment/Plan: #1 Low Back Pain: unclear cause and back X-rays pending, PT evaluation requested. #2 Nausea and vomiting: unclear cause, will check UGI with SBFT today,  continue zofran prn #3 Gait instability: moderate, may need SNF rehabilitation  LOS: 1 day   [name] 07/19/2013, 8:19 AM

## 2013-07-20 DIAGNOSIS — M545 Low back pain: Secondary | ICD-10-CM | POA: Diagnosis not present

## 2013-07-20 DIAGNOSIS — R112 Nausea with vomiting, unspecified: Secondary | ICD-10-CM | POA: Diagnosis not present

## 2013-07-20 DIAGNOSIS — E1139 Type 2 diabetes mellitus with other diabetic ophthalmic complication: Secondary | ICD-10-CM | POA: Diagnosis not present

## 2013-07-20 LAB — GLUCOSE, CAPILLARY
GLUCOSE-CAPILLARY: 208 mg/dL — AB (ref 70–99)
Glucose-Capillary: 181 mg/dL — ABNORMAL HIGH (ref 70–99)
Glucose-Capillary: 173 mg/dL — ABNORMAL HIGH (ref 70–99)
Glucose-Capillary: 148 mg/dL — ABNORMAL HIGH (ref 70–99)

## 2013-07-20 MED ORDER — TRAMADOL HCL 50 MG PO TABS
50.0000 mg | ORAL_TABLET | Freq: Four times a day (QID) | ORAL | Status: DC | PRN
  Administered 2013-07-20 – 2013-07-26 (×11): 50 mg via ORAL
  Filled 2013-07-20 (×11): qty 1

## 2013-07-20 NOTE — Progress Notes (Signed)
Physical Therapy Treatment Patient Details Name: [name] MRN: [id number] DOB: May 30, 1940 Today's Date: 2013-08-01    History of Present Illness Pt was admitted for low back pain; recently fell backwards and hit head    PT Comments    Marked improvement in activity tolerance with pt reporting decreased pain level with mobility  Follow Up Recommendations  SNF     Equipment Recommendations  None recommended by PT    Recommendations for Other Services OT consult     Precautions / Restrictions Precautions Precautions: Fall Precaution Comments: unstable with gait and incontinent of urine with activity Restrictions Weight Bearing Restrictions: No    Mobility  Bed Mobility Overal bed mobility:  (OOB with nursing and wants to stay up in chair)                Transfers Overall transfer level: Needs assistance Equipment used: Rolling walker (2 wheeled) Transfers: Sit to/from Stand Sit to Stand: Min assist         General transfer comment: cues for hand placement  Ambulation/Gait Ambulation/Gait assistance: +2 safety/equipment;Min assist Ambulation Distance (Feet): 78 Feet (twice) Assistive device: Rolling walker (2 wheeled) Gait Pattern/deviations: Step-to pattern;Step-through pattern;Decreased step length - right;Decreased step length - left;Shuffle;Trunk flexed Gait velocity: decr   General Gait Details: cues for posture, position from RW; pt with mild instability all directions; multiple rests required 2* fatigue   Stairs            Wheelchair Mobility    Modified Rankin (Stroke Patients Only)       Balance                                    Cognition Arousal/Alertness: Awake/alert Behavior During Therapy: WFL for tasks assessed/performed Overall Cognitive Status: Within Functional Limits for tasks assessed                      Exercises      General Comments        Pertinent Vitals/Pain 5/10; Meds  requested from [place]                      Prior Function            PT Goals (current goals can now be found in the care plan section) Acute Rehab PT Goals Patient Stated Goal: Home with husband PT Goal Formulation: With patient Time For Goal Achievement: 08/02/13 Potential to Achieve Goals: Good Progress towards PT goals: Progressing toward goals    Frequency  Min 3X/week    PT Plan Current plan remains appropriate    Co-evaluation             End of Session Equipment Utilized During Treatment: Gait belt Activity Tolerance: Patient tolerated treatment well Patient left: in chair;with call bell/phone within reach;with chair alarm set     Time: 1140-1209 PT Time Calculation (min): 29 min  Charges:  $Gait Training: 23-37 mins                    G Codes:      Mathis Fare 08-01-2013, 1:09 PM

## 2013-07-20 NOTE — Progress Notes (Addendum)
Subjective: Had some regurgitation, mild vomiting an hour ago. Did not feel sick. Just all of a sudden it came up. 8-9/10 pain in back she says.  Objective: Vital signs in last 24 hours: Temp:  [97.5 F (36.4 C)-98.3 F (36.8 C)] 98.3 F (36.8 C) (05/30 0700) Pulse Rate:  [57-62] 57 (05/30 0700) Resp:  [16-18] 16 (05/30 0700) BP: (149-159)/(79-84) 149/79 mmHg (05/30 0700) SpO2:  [98 %-99 %] 98 % (05/30 0700) Weight change:  Last BM Date: 07/15/13  Intake/Output from previous day: 05/29 0701 - 05/30 0700 In: 1368 [P.O.:480; I.V.:888] Out: -  Intake/Output this shift: Total I/O In: 360 [P.O.:360] Out: -   General appearance: alert, cooperative and appears stated age Resp: clear to auscultation bilaterally Cardio: regular rate and rhythm, S1, S2 normal, no murmur, click, rub or gallop GI: soft, non-tender; bowel sounds normal; no masses,  no organomegaly Extremities: extremities normal, atraumatic, no cyanosis or edema Neurologic: Grossly normal Sitting in chair eating some liquid food.  Lab Results:  Recent Labs  07/18/13 1821  WBC 6.2  HGB 12.7  HCT 37.8  PLT 208   BMET  Recent Labs  07/18/13 1821  NA 147  K 3.6*  CL 106  CO2 28  GLUCOSE 98  BUN 16  CREATININE 0.70  CALCIUM 9.7   CMET CMP     Component Value Date/Time   NA 147 07/18/2013 1821   K 3.6* 07/18/2013 1821   CL 106 07/18/2013 1821   CO2 28 07/18/2013 1821   GLUCOSE 98 07/18/2013 1821   BUN 16 07/18/2013 1821   CREATININE 0.70 07/18/2013 1821   CALCIUM 9.7 07/18/2013 1821   PROT 7.3 07/18/2013 1821   ALBUMIN 3.6 07/18/2013 1821   AST 14 07/18/2013 1821   ALT 12 07/18/2013 1821   ALKPHOS 63 07/18/2013 1821   BILITOT 0.5 07/18/2013 1821   GFRNONAA 83* 07/18/2013 1821   GFRAA >90 07/18/2013 1821     Studies/Results: Dg Thoracic Spine 2 View  07/19/2013   CLINICAL DATA:  Fall, mid to lower back pain  EXAM: THORACIC SPINE - 2 VIEW  COMPARISON:  MRI thoracic spine 01/15/2013  FINDINGS: There is no  evidence of thoracic spine fracture. Alignment is normal. No other significant bone abnormalities are identified. Mild degenerative spurring at T12 -L1 and L1-L2 on the frontal view.  IMPRESSION: Negative.   Electronically Signed   By: Jacqulynn Cadet M.D.   On: 07/19/2013 09:19   Dg Lumbar Spine 2-3 Views  07/19/2013   CLINICAL DATA:  recent fall, severe low back pain, eval for compression fracture  EXAM: LUMBAR SPINE - 2-3 VIEW  COMPARISON:  04/18/2012.  09/15/2010.  FINDINGS: The lumbosacral junction is transitional. Numbering is based on the previous lumbar spine intraoperative radiographs. L1 compression fracture is stable. There is a slight superior endplate depression at L3 which is new. Degenerative change throughout the lumbar spine is not significantly changed.  IMPRESSION: Stable L1 compression.  New L3 superior endplate depression.   Electronically Signed   By: Maryclare Bean M.D.   On: 07/19/2013 11:10   Dg Ugi W/small Bowel  07/19/2013   CLINICAL DATA:  Nausea and vomiting.  Question bowel obstruction.  EXAM: UPPER GI SERIES WITH SMALL BOWEL FOLLOW-THROUGH  FLUOROSCOPY TIME:  1 min, 10 seconds  TECHNIQUE: Single contrast upper GI series using thin barium was performed. Subsequently, serial images of the small bowel were obtained.  COMPARISON:  None.  FINDINGS: The esophagus is patulous. No stricture or mass  identified. No evidence of inflammatory change is seen. There are some tertiary esophageal contractions. No hiatal hernia or gastroesophageal reflux is seen.The stomach, duodenum bulb and sweep are all unremarkable. The small bowel is unremarkable without evidence of obstruction. No stricture, or mass is seen. Contrast reaches the cecum.  IMPRESSION: No acute finding.  Negative for bowel obstruction.  Mild esophageal dysmotility.   Electronically Signed   By: Inge Rise M.D.   On: 07/19/2013 12:54    Medications: I have reviewed the patient's current medications.  . enoxaparin  (LOVENOX) injection  40 mg Subcutaneous Q24H  . glipiZIDE  5 mg Oral BID WC  . insulin aspart  0-15 Units Subcutaneous TID WC  . insulin aspart  0-5 Units Subcutaneous QHS  . losartan  100 mg Oral Daily  . metoprolol tartrate  12.5 mg Oral BID  . polyethylene glycol  17 g Oral Daily  . pramipexole  0.25 mg Oral BID  . senna-docusate  1 tablet Oral q morning - 10a  . verapamil  80 mg Oral Q12H   CBG (last 3)   Recent Labs  07/19/13 2203 07/20/13 0747 07/20/13 1137  GLUCAP 180* 181* 208*      Assessment/Plan:  Principal Problem:   Low back pain-new L3 compression fracture noted.  Patient has osteoporosis with 2 vertebral fractures now.  Defer further management to pcp (?mri, ? vertebroplasty or kyphoplasty, ? Other bone meds moving forward).  She will need PT. SNF as well for a time. Active Problems:   HYPERTENSION-stable   DIABETES MELLITUS, TYPE II, HX OF-cont. meds.   Gait instability-pt/ot   Nausea with vomiting-ugi and sbft unremarkable.  Follow. She says she has had 2 yrs of sxs. Defer further eval to pcp.  But,  She did throw up 30 min after the hydrocodone dose. She says she takes this at home and has the same n/v issue.  She is known to not tolerate morphine and oxycodone.  We don't want her to suffer in pain too much.  For now I will stop the hydrocodone and dilaudid. She can use tylenol and ultram as needed for pain (we can increase the ultram if she tolerates the low dose).  It is very possible the narcotics are causing this sudden large volume vomiting without a lot of true nausea with it and that is what is happening.   LOS: 2 days   Jerlyn Ly, MD 07/20/2013, 1:45 PM

## 2013-07-21 DIAGNOSIS — R112 Nausea with vomiting, unspecified: Secondary | ICD-10-CM | POA: Diagnosis not present

## 2013-07-21 DIAGNOSIS — E1139 Type 2 diabetes mellitus with other diabetic ophthalmic complication: Secondary | ICD-10-CM | POA: Diagnosis not present

## 2013-07-21 DIAGNOSIS — M545 Low back pain: Secondary | ICD-10-CM | POA: Diagnosis not present

## 2013-07-21 LAB — GLUCOSE, CAPILLARY
GLUCOSE-CAPILLARY: 149 mg/dL — AB (ref 70–99)
GLUCOSE-CAPILLARY: 189 mg/dL — AB (ref 70–99)
Glucose-Capillary: 161 mg/dL — ABNORMAL HIGH (ref 70–99)
Glucose-Capillary: 178 mg/dL — ABNORMAL HIGH (ref 70–99)
Glucose-Capillary: 187 mg/dL — ABNORMAL HIGH (ref 70–99)

## 2013-07-21 MED ORDER — METOCLOPRAMIDE HCL 5 MG PO TABS
5.0000 mg | ORAL_TABLET | Freq: Three times a day (TID) | ORAL | Status: DC
  Administered 2013-07-21 – 2013-07-27 (×20): 5 mg via ORAL
  Filled 2013-07-21 (×27): qty 1

## 2013-07-21 MED ORDER — ONDANSETRON HCL 4 MG/2ML IJ SOLN
4.0000 mg | Freq: Once | INTRAMUSCULAR | Status: AC
  Administered 2013-07-21: 4 mg via INTRAVENOUS
  Filled 2013-07-21: qty 2

## 2013-07-21 MED ORDER — ONDANSETRON 8 MG/NS 50 ML IVPB
8.0000 mg | Freq: Four times a day (QID) | INTRAVENOUS | Status: DC | PRN
  Administered 2013-07-25 – 2013-07-26 (×3): 8 mg via INTRAVENOUS
  Filled 2013-07-21 (×4): qty 8

## 2013-07-21 MED ORDER — ONDANSETRON HCL 8 MG PO TABS
8.0000 mg | ORAL_TABLET | Freq: Four times a day (QID) | ORAL | Status: DC | PRN
  Filled 2013-07-21: qty 1

## 2013-07-21 NOTE — Progress Notes (Signed)
Spoke with Pt's daughter re: beginning a SNF bed search on Pt's behalf.  Pt's daughter stated that she's unsure how MD wants to handle Pt's broken bone in her back and, until she has a clear understanding of that course of treatment, she doesn't want to begin a SNF search; she feels that it's premature.  CSW thanked Pt's daughter for her time and stated that CSW will continue to follow.  Bernita Raisin, [place] Social Work [phone]

## 2013-07-21 NOTE — Progress Notes (Signed)
Subjective: She  Says  Her pain level is 2-3/10 when lying still. When moves hurts. States 5 episodes of vomiting   Objective: Vital signs in last 24 hours: Temp:  [98.2 F (36.8 C)-99 F (37.2 C)] 99 F (37.2 C) (05/31 0439) Pulse Rate:  [68-82] 68 (05/31 0439) Resp:  [16-18] 16 (05/31 0439) BP: (148-160)/(84-109) 148/84 mmHg (05/31 0439) SpO2:  [98 %] 98 % (05/31 0439) Weight change:  Last BM Date: 07/20/13  Intake/Output from previous day: 05/30 0701 - 05/31 0700 In: 3244.3 [P.O.:600; I.V.:2644.3] Out: 853 [Urine:850; Emesis/NG output:1; Blood:2] Intake/Output this shift:    General appearance: alert, cooperative and appears stated age Resp: clear to auscultation bilaterally Cardio: regular rate and rhythm, S1, S2 normal, no murmur, click, rub or gallop GI: soft, non-tender; bowel sounds normal; no masses,  no organomegaly Extremities: extremities normal, atraumatic, no cyanosis or edema Neurologic: Grossly normal   Lab Results:  Recent Labs  07/18/13 1821  WBC 6.2  HGB 12.7  HCT 37.8  PLT 208   BMET  Recent Labs  07/18/13 1821  NA 147  K 3.6*  CL 106  CO2 28  GLUCOSE 98  BUN 16  CREATININE 0.70  CALCIUM 9.7   CMET CMP     Component Value Date/Time   NA 147 07/18/2013 1821   K 3.6* 07/18/2013 1821   CL 106 07/18/2013 1821   CO2 28 07/18/2013 1821   GLUCOSE 98 07/18/2013 1821   BUN 16 07/18/2013 1821   CREATININE 0.70 07/18/2013 1821   CALCIUM 9.7 07/18/2013 1821   PROT 7.3 07/18/2013 1821   ALBUMIN 3.6 07/18/2013 1821   AST 14 07/18/2013 1821   ALT 12 07/18/2013 1821   ALKPHOS 63 07/18/2013 1821   BILITOT 0.5 07/18/2013 1821   GFRNONAA 83* 07/18/2013 1821   GFRAA >90 07/18/2013 1821     Studies/Results: Dg Ugi W/small Bowel  07/19/2013   CLINICAL DATA:  Nausea and vomiting.  Question bowel obstruction.  EXAM: UPPER GI SERIES WITH SMALL BOWEL FOLLOW-THROUGH  FLUOROSCOPY TIME:  1 min, 10 seconds  TECHNIQUE: Single contrast upper GI series using thin  barium was performed. Subsequently, serial images of the small bowel were obtained.  COMPARISON:  None.  FINDINGS: The esophagus is patulous. No stricture or mass identified. No evidence of inflammatory change is seen. There are some tertiary esophageal contractions. No hiatal hernia or gastroesophageal reflux is seen.The stomach, duodenum bulb and sweep are all unremarkable. The small bowel is unremarkable without evidence of obstruction. No stricture, or mass is seen. Contrast reaches the cecum.  IMPRESSION: No acute finding.  Negative for bowel obstruction.  Mild esophageal dysmotility.   Electronically Signed   By: Inge Rise M.D.   On: 07/19/2013 12:54    Medications: I have reviewed the patient's current medications.  . enoxaparin (LOVENOX) injection  40 mg Subcutaneous Q24H  . glipiZIDE  5 mg Oral BID WC  . insulin aspart  0-15 Units Subcutaneous TID WC  . insulin aspart  0-5 Units Subcutaneous QHS  . losartan  100 mg Oral Daily  . metoprolol tartrate  12.5 mg Oral BID  . polyethylene glycol  17 g Oral Daily  . pramipexole  0.25 mg Oral BID  . senna-docusate  1 tablet Oral q morning - 10a  . verapamil  80 mg Oral Q12H   CBG (last 3)   Recent Labs  07/20/13 2119 07/21/13 0352 07/21/13 0807  GLUCAP 148* 161* 149*      Assessment/Plan:  Principal Problem:   Low back pain-due to compression fracture. Defer further eval and treatment to pcp. Active Problems:   HYPERTENSION-stable.   DIABETES MELLITUS, TYPE II, HX OF-sugars okay.   Gait instability-PT, snf eventually   Nausea with vomiting-more or less this is just sudden onset of vomiting episodes without nausea.  I stopped the stronger narcotics and she has had a few doses of tramadol and this is not better, if not worse.   i wonder if it is more of a motility issue and i will add reglan 5 mg qac and qhs. May need gi eval if doesn't improve and this symptom is making pain management choices difficult. Continue ivf.    LOS: 3 days   Jerlyn Ly, MD 07/21/2013, 11:47 AM

## 2013-07-22 DIAGNOSIS — M545 Low back pain: Secondary | ICD-10-CM | POA: Diagnosis not present

## 2013-07-22 DIAGNOSIS — E1139 Type 2 diabetes mellitus with other diabetic ophthalmic complication: Secondary | ICD-10-CM | POA: Diagnosis not present

## 2013-07-22 DIAGNOSIS — R112 Nausea with vomiting, unspecified: Secondary | ICD-10-CM | POA: Diagnosis not present

## 2013-07-22 DIAGNOSIS — E119 Type 2 diabetes mellitus without complications: Secondary | ICD-10-CM | POA: Diagnosis not present

## 2013-07-22 DIAGNOSIS — R1110 Vomiting, unspecified: Secondary | ICD-10-CM | POA: Diagnosis not present

## 2013-07-22 LAB — COMPREHENSIVE METABOLIC PANEL
ALT: 12 U/L (ref 0–35)
AST: 15 U/L (ref 0–37)
Albumin: 3.8 g/dL (ref 3.5–5.2)
Alkaline Phosphatase: 66 U/L (ref 39–117)
BUN: 11 mg/dL (ref 6–23)
CO2: 25 mEq/L (ref 19–32)
Calcium: 9.7 mg/dL (ref 8.4–10.5)
Chloride: 102 mEq/L (ref 96–112)
Creatinine, Ser: 0.81 mg/dL (ref 0.50–1.10)
GFR calc non Af Amer: 70 mL/min — ABNORMAL LOW (ref >90)
GFR, EST AFRICAN AMERICAN: 81 mL/min — AB (ref >90)
GLUCOSE: 227 mg/dL — AB (ref 70–99)
POTASSIUM: 3.4 meq/L — AB (ref 3.7–5.3)
Sodium: 142 mEq/L (ref 137–147)
Total Bilirubin: 0.8 mg/dL (ref 0.3–1.2)
Total Protein: 7.4 g/dL (ref 6.0–8.3)

## 2013-07-22 LAB — CBC WITH DIFFERENTIAL/PLATELET
BASOS PCT: 0 % (ref 0–1)
Basophils Absolute: 0 10*3/uL (ref 0.0–0.1)
EOS ABS: 0.1 10*3/uL (ref 0.0–0.7)
Eosinophils Relative: 1 % (ref 0–5)
HCT: 37.6 % (ref 36.0–46.0)
HEMOGLOBIN: 12.8 g/dL (ref 12.0–15.0)
LYMPHS ABS: 1 10*3/uL (ref 0.7–4.0)
LYMPHS PCT: 13 % (ref 12–46)
MCH: 27.6 pg (ref 26.0–34.0)
MCHC: 34 g/dL (ref 30.0–36.0)
MCV: 81.2 fL (ref 78.0–100.0)
Monocytes Absolute: 0.5 10*3/uL (ref 0.1–1.0)
Monocytes Relative: 6 % (ref 3–12)
NEUTROS ABS: 6.2 10*3/uL (ref 1.7–7.7)
Neutrophils Relative %: 80 % — ABNORMAL HIGH (ref 43–77)
Platelets: ADEQUATE 10*3/uL (ref 150–400)
RBC: 4.63 MIL/uL (ref 3.87–5.11)
RDW: 14.2 % (ref 11.5–15.5)
WBC: 7.8 10*3/uL (ref 4.0–10.5)

## 2013-07-22 LAB — GLUCOSE, CAPILLARY
Glucose-Capillary: 143 mg/dL — ABNORMAL HIGH (ref 70–99)
Glucose-Capillary: 162 mg/dL — ABNORMAL HIGH (ref 70–99)
Glucose-Capillary: 160 mg/dL — ABNORMAL HIGH (ref 70–99)

## 2013-07-22 LAB — VITAMIN D 25 HYDROXY (VIT D DEFICIENCY, FRACTURES): VIT D 25 HYDROXY: 40 ng/mL (ref 30–89)

## 2013-07-22 MED ORDER — MAGNESIUM HYDROXIDE 400 MG/5ML PO SUSP
30.0000 mL | Freq: Every day | ORAL | Status: DC
  Administered 2013-07-22 – 2013-07-23 (×2): 30 mL via ORAL
  Filled 2013-07-22 (×3): qty 30

## 2013-07-22 NOTE — Progress Notes (Signed)
Subjective: She continues to have problems with intermittent vomiting with no preceding nausea or abdominal pain, and has not had a BM in a few days, has low back pain with standing.  Objective: Vital signs in last 24 hours: Temp:  [97.4 F (36.3 C)-99.7 F (37.6 C)] 97.6 F (36.4 C) (06/01 0520) Pulse Rate:  [79-94] 88 (06/01 0520) Resp:  [16] 16 (06/01 0520) BP: (159-166)/(90-98) 159/91 mmHg (06/01 0520) SpO2:  [95 %-98 %] 96 % (06/01 0520) Weight change:    Intake/Output from previous day: 05/31 0701 - 06/01 0700 In: 1180 [P.O.:240; I.V.:940] Out: 275 [Urine:275]   General appearance: alert, cooperative and no distress Resp: clear to auscultation bilaterally Cardio: regular rate and rhythm GI: soft, non-tender; bowel sounds normal; no masses,  no organomegaly Extremities: bilateral trace leg edema  Lab Results:  Recent Labs  07/22/13 0406  WBC 7.8  HGB 12.8  HCT 37.6  PLT PLATELET CLUMPS NOTED ON SMEAR, COUNT APPEARS ADEQUATE   BMET  Recent Labs  07/22/13 0406  NA 142  K 3.4*  CL 102  CO2 25  GLUCOSE 227*  BUN 11  CREATININE 0.81  CALCIUM 9.7   CMET CMP     Component Value Date/Time   NA 142 07/22/2013 0406   K 3.4* 07/22/2013 0406   CL 102 07/22/2013 0406   CO2 25 07/22/2013 0406   GLUCOSE 227* 07/22/2013 0406   BUN 11 07/22/2013 0406   CREATININE 0.81 07/22/2013 0406   CALCIUM 9.7 07/22/2013 0406   PROT 7.4 07/22/2013 0406   ALBUMIN 3.8 07/22/2013 0406   AST 15 07/22/2013 0406   ALT 12 07/22/2013 0406   ALKPHOS 66 07/22/2013 0406   BILITOT 0.8 07/22/2013 0406   GFRNONAA 70* 07/22/2013 0406   GFRAA 81* 07/22/2013 0406    CBG (last 3)   Recent Labs  07/21/13 1213 07/21/13 1659 07/21/13 2155  GLUCAP 178* 189* 187*    INR RESULTS:   Lab Results  Component Value Date   INR 1.01 10/07/2010     Studies/Results: No results found.  Medications: I have reviewed the patient's current medications.  Assessment/Plan: #1 Low Back Pain: likely due to mild  compression fracture. We will obtain a L-spine MRI and try a spine brace. If an acute fracture is present and the bracing is ineffective then we will request evaluation for Kyphoplasty. #2 Vomiting: unclear cause, possibly psychogenic given entirely normal findings on upper GI with SBFT. I have requested a GI specialist evaluation for this. #3 DM2: stable on current meds. Anticipate discharge to a SNF within the next 48 hours.    LOS: 4 days   [name] 07/22/2013, 7:38 AM

## 2013-07-22 NOTE — Progress Notes (Signed)
Physical Therapy Treatment Patient Details Name: [name] MRN: [id number] DOB: Feb 26, 1940 Today's Date: 07/22/2013    History of Present Illness Pt was admitted for low back pain-L1 comp fx, new L3 endplate depression-recently fell backwards and hit head    PT Comments    Progressing with mobility. Pt denied pain during session. C/o restless legs. Pt intermittently tearful about situation.   Follow Up Recommendations  SNF     Equipment Recommendations  None recommended by PT    Recommendations for Other Services OT consult     Precautions / Restrictions Precautions Precautions: Fall Precaution Comments: unstable with gait and incontinent of urine with activity Restrictions Weight Bearing Restrictions: No    Mobility  Bed Mobility Overal bed mobility: Needs Assistance Bed Mobility: Supine to Sit Rolling: Min assist Sidelying to sit: Mod assist Supine to sit: Min assist     General bed mobility comments: cues for log roll technique; physical assist to complete log roll.   Transfers Overall transfer level: Needs assistance Equipment used: 1 person hand held assist Transfers: Sit to/from Stand Sit to Stand: Mod assist         General transfer comment: Assist to rise, stabilize, control descent. VCs safety, technique, hand placement  Ambulation/Gait Ambulation/Gait assistance: Min assist Ambulation Distance (Feet): 100 Feet Assistive device: Rolling walker (2 wheeled) Gait Pattern/deviations: Decreased stride length;Trunk flexed;Step-through pattern     General Gait Details: Assist to stabilize throughout ambulation. VCs safety, distance from RW. slow gait speed. Pt denied pain.    Stairs            Wheelchair Mobility    Modified Rankin (Stroke Patients Only)       Balance                                    Cognition Arousal/Alertness: Awake/alert   Overall Cognitive Status: Within Functional Limits for tasks  assessed                      Exercises      General Comments        Pertinent Vitals/Pain Pt denied pain during session    Home Living                      Prior Function            PT Goals (current goals can now be found in the care plan section) Progress towards PT goals: Progressing toward goals    Frequency  Min 3X/week    PT Plan Current plan remains appropriate    Co-evaluation             End of Session Equipment Utilized During Treatment: Gait belt Activity Tolerance: Patient tolerated treatment well Patient left: in bed;with call bell/phone within reach     Time: 0935-1001 PT Time Calculation (min): 26 min  Charges:  $Gait Training: 8-22 mins $Therapeutic Activity: 8-22 mins                    G Codes:      Weston Anna, MPT Pager: [phone]

## 2013-07-22 NOTE — Progress Notes (Signed)
Inpatient Diabetes Program Recommendations  AACE/ADA: New Consensus Statement on Inpatient Glycemic Control (2013)  Target Ranges:  Prepandial:   less than 140 mg/dL      Peak postprandial:   less than 180 mg/dL (1-2 hours)      Critically ill patients:  140 - 180 mg/dL   Reason for Visit: Hyperglycemia  Diabetes history: DM2 Outpatient Diabetes medications: glipizide 5 mg bid, metformin 500 mg bid Current orders for Inpatient glycemic control: Novolog moderate tidwc and hs, glipizide 5 mg bid  Results for EDDY, TERMINE (MRN [id number]) as of 07/22/2013 11:09  Ref. Range 07/21/2013 12:13 07/21/2013 16:59 07/21/2013 21:55  Glucose-Capillary Latest Range: 70-99 mg/dL 178 (H) 189 (H) 187 (H)  Results for CHRISTANA, ANGELICA (MRN [id number]) as of 07/22/2013 11:09  Ref. Range 07/22/2013 04:06  Glucose Latest Range: 70-99 mg/dL 227 (H)    Inpatient Diabetes Program Recommendations Insulin - Basal: Consider addition of basal insulin - Lantus 12 units QHS Oral Agents: D/C glipizide while inpatient to prevent hypoglycemia HgbA1C: Updated HgbA1C to assess glycemic control prior to hospitalization Diet: Please order CHO mod medium   Note: Will continue to follow while inpatient. Thank you. Lorenda Peck, RD, LDN, CDE Inpatient Diabetes Coordinator [phone]

## 2013-07-22 NOTE — Progress Notes (Signed)
Patient still having multiple episodes of vominting with no warning (no nausea). She is unable to receive any anti-nausea medicine at this time. MD on call made aware and new order received for zofran 8 mg q 6 hr prn, with a now dose of 4 mg zofran IV. Patient and daughter aware of plan, will monitor patient throughout the night.

## 2013-07-22 NOTE — Progress Notes (Signed)
Occupational Therapy Treatment Patient Details Name: LAVETTE YANKOVICH MRN: [id number] DOB: 04-28-1940 Today's Date: 07/22/2013    History of present illness Pt was admitted for low back pain-L1 comp fx, new L3 endplate depression-recently fell backwards and hit head               Precautions / Restrictions Precautions Precautions: Fall Precaution Comments: unstable with gait and incontinent of urine with activity Restrictions Weight Bearing Restrictions: No       Mobility Bed Mobility Overal bed mobility: Needs Assistance Bed Mobility: Supine to Sit Rolling: Min assist Sidelying to sit: Mod assist Supine to sit: Min assist     General bed mobility comments: cues for log roll technique; physical assist to complete log roll.   Transfers Overall transfer level: Needs assistance Equipment used: 1 person hand held assist Transfers: Sit to/from Stand Sit to Stand: Mod assist         General transfer comment: Assist to rise, stabilize, control descent. VCs safety, technique, hand placement        ADL                           Toilet Transfer: Moderate assistance;Comfort height toilet   Toileting- Clothing Manipulation and Hygiene: Sit to/from stand;+2 for physical assistance         General ADL Comments: Pt very motivated to get to bedside commode.  Total A with hygiene                Cognition     Overall Cognitive Status: Within Functional Limits for tasks assessed                                    Frequency       Progress Toward Goals  OT Goals(current goals can now be found in the care plan section)  Progress towards OT goals: Progressing toward goals               Activity Tolerance Patient tolerated treatment well   Patient Left  in bed with call bell and bed alarm on and nurse present   Nurse Communication Mobility status        Time: 4132-4401 OT Time Calculation (min): 23 min  Charges: OT General  Charges $OT Visit: 1 Procedure OT Treatments $Self Care/Home Management : 23-37 mins  Betsy Pries 07/22/2013, 12:14 PM

## 2013-07-22 NOTE — Consult Note (Signed)
Reason for Consult: Intermittent nausea and vomiting Referring Physician: [name], M.D.  [name] Celestin HPI: This is a 73 year old female with multiple medical problems admitted for back pain.  During this time she started to have problems with nausea and vomiting.  She reports that this is not a new event.  She has had this problem for a number of years, but no prior evaluation by Dr. Collene Mares.  In fact, her last evaluation in the office was in 2011 and she had a colonoscopy at that time.  The patient reports intermittent nausea/vomiting that is not precipitated by any events or maneuvers.  On average she will have 3 episodes per month and they resolve without any intervention.  No reports of any dysphagia or associated with loss.  Today she reports a good day as she tolerated all PO intake.  Over the weekend she had more problems with nausea and vomiting.  No complaints of abdominal pain.  Past Medical History  Diagnosis Date  . Anxiety   . Dyspnea   . Diabetes mellitus   . Osteoarthritis   . Hypercholesteremia   . Depression   . RLS (restless legs syndrome)   . Stress   . PONV (postoperative nausea and vomiting)   . Headache(784.0)   . HTN (hypertension)     dr Johnsie Cancel    Past Surgical History  Procedure Laterality Date  . Colonoscopy    . Abdominal hysterectomy    . Lumbar laminectomy/decompression microdiscectomy N/A 04/18/2012    Procedure: LUMBAR LAMINECTOMY/DECOMPRESSION MICRODISCECTOMY 3 LEVELS;  Surgeon: Winfield Cunas, MD;  Location: [facility];  Service: Neurosurgery;  Laterality: N/A;  Lumbar three-four, lumbar four-five, lumbar five-sacral one decompression. Synovial cyst resection lumbar four-five  . Joint replacement    . Left forearm fracture with orif Left     Family History  Problem Relation Age of Onset  . Diabetes Father   . High blood pressure Father   . High blood pressure Mother     Social History:  reports that she has never smoked. She has never  used smokeless tobacco. She reports that she does not drink alcohol or use illicit drugs.  Allergies:  Allergies  Allergen Reactions  . Morphine And Related Other (See Comments)    hallucination   . Oxycodone Other (See Comments)    hallucination   . Sulfonamide Derivatives Swelling    Medications:  Scheduled: . enoxaparin (LOVENOX) injection  40 mg Subcutaneous Q24H  . glipiZIDE  5 mg Oral BID WC  . insulin aspart  0-15 Units Subcutaneous TID WC  . insulin aspart  0-5 Units Subcutaneous QHS  . losartan  100 mg Oral Daily  . magnesium hydroxide  30 mL Oral Daily  . metoCLOPramide  5 mg Oral TID AC & HS  . metoprolol tartrate  12.5 mg Oral BID  . polyethylene glycol  17 g Oral Daily  . pramipexole  0.25 mg Oral BID  . senna-docusate  1 tablet Oral q morning - 10a  . verapamil  80 mg Oral Q12H   Continuous: . 0.9 % NaCl with KCl 20 mEq / L 40 mL/hr at 07/21/13 1800    Results for orders placed during the hospital encounter of 07/18/13 (from the past 24 hour(s))  GLUCOSE, CAPILLARY     Status: Abnormal   Collection Time    07/21/13  9:55 PM      Result Value Ref Range   Glucose-Capillary 187 (*) 70 - 99 mg/dL  Comment 1 Notify RN    CBC WITH DIFFERENTIAL     Status: Abnormal   Collection Time    07/22/13  4:06 AM      Result Value Ref Range   WBC 7.8  4.0 - 10.5 K/uL   RBC 4.63  3.87 - 5.11 MIL/uL   Hemoglobin 12.8  12.0 - 15.0 g/dL   HCT 37.6  36.0 - 46.0 %   MCV 81.2  78.0 - 100.0 fL   MCH 27.6  26.0 - 34.0 pg   MCHC 34.0  30.0 - 36.0 g/dL   RDW 14.2  11.5 - 15.5 %   Platelets    150 - 400 K/uL   Value: PLATELET CLUMPS NOTED ON SMEAR, COUNT APPEARS ADEQUATE   Neutrophils Relative % 80 (*) 43 - 77 %   Lymphocytes Relative 13  12 - 46 %   Monocytes Relative 6  3 - 12 %   Eosinophils Relative 1  0 - 5 %   Basophils Relative 0  0 - 1 %   Neutro Abs 6.2  1.7 - 7.7 K/uL   Lymphs Abs 1.0  0.7 - 4.0 K/uL   Monocytes Absolute 0.5  0.1 - 1.0 K/uL   Eosinophils  Absolute 0.1  0.0 - 0.7 K/uL   Basophils Absolute 0.0  0.0 - 0.1 K/uL   Smear Review PLATELET COUNT CONFIRMED BY SMEAR    COMPREHENSIVE METABOLIC PANEL     Status: Abnormal   Collection Time    07/22/13  4:06 AM      Result Value Ref Range   Sodium 142  137 - 147 mEq/L   Potassium 3.4 (*) 3.7 - 5.3 mEq/L   Chloride 102  96 - 112 mEq/L   CO2 25  19 - 32 mEq/L   Glucose, Bld 227 (*) 70 - 99 mg/dL   BUN 11  6 - 23 mg/dL   Creatinine, Ser 0.81  0.50 - 1.10 mg/dL   Calcium 9.7  8.4 - 10.5 mg/dL   Total Protein 7.4  6.0 - 8.3 g/dL   Albumin 3.8  3.5 - 5.2 g/dL   AST 15  0 - 37 U/L   ALT 12  0 - 35 U/L   Alkaline Phosphatase 66  39 - 117 U/L   Total Bilirubin 0.8  0.3 - 1.2 mg/dL   GFR calc non Af Amer 70 (*) >90 mL/min   GFR calc Af Amer 81 (*) >90 mL/min  VITAMIN D 25 HYDROXY     Status: None   Collection Time    07/22/13  4:06 AM      Result Value Ref Range   Vit D, 25-Hydroxy 40  30 - 89 ng/mL  GLUCOSE, CAPILLARY     Status: Abnormal   Collection Time    07/22/13 12:12 PM      Result Value Ref Range   Glucose-Capillary 143 (*) 70 - 99 mg/dL   Comment 1 Documented in Chart     Comment 2 Notify RN    GLUCOSE, CAPILLARY     Status: Abnormal   Collection Time    07/22/13  5:19 PM      Result Value Ref Range   Glucose-Capillary 162 (*) 70 - 99 mg/dL   Comment 1 Documented in Chart     Comment 2 Notify RN       No results found.  ROS:  As stated above in the HPI otherwise negative.  Blood pressure 180/82, pulse 87, temperature  98.9 F (37.2 C), temperature source Oral, resp. rate 16, height 5\' 7"  (1.702 m), weight 218 lb 0.6 oz (98.9 kg), SpO2 98.00%.    PE: Gen: NAD, Alert and Oriented HEENT:  [place]/AT, EOMI Neck: Supple, no LAD Lungs: CTA Bilaterally CV: RRR without M/G/R ABM: Soft, NTND, +BS Ext: No C/C/E  Assessment/Plan: 1) Nausea/Vomiting. 2) Low back pain. 3) DM. 4) Anxiety.   The sporadic nature of her symptoms makes it difficult to identify the source.   Her UGI series was negative for any evidence of obstruction.  I do not feel that she has GERD induced vomiting.  Gastroparesis is a consideration, but it is odd that she would have these intermittent spells rather than constant symptoms.  She is better today and I will hold off on any procedures.  Plan: 1) Continue with supportive care. 2) If symptoms recur, than an EGD is not unreasonable.  Beryle Beams 07/22/2013, 5:53 PM

## 2013-07-22 NOTE — Progress Notes (Signed)
CSW continuing to follow for disposition planning.  Pt and pt daughter have been hesitant to agree to SNF search until pt treatment plan for pt back is determined.  CSW met with pt daughter in the hallway. Pt receiving nursing care at this time. Pt daughter reports that she is still unsure what the plan is for pt care and states that it is pt decision regarding SNF, but pt has stated to her that she does not want SNF. CSW stated that CSW will follow up with pt to further discuss.  CSW to follow up with pt tomorrow to determine if pt agreeable to SNF placement.  Alison Murray, MSW, [place] [facility] [phone]

## 2013-07-23 ENCOUNTER — Inpatient Hospital Stay (HOSPITAL_COMMUNITY): Payer: Medicare Other

## 2013-07-23 DIAGNOSIS — E119 Type 2 diabetes mellitus without complications: Secondary | ICD-10-CM | POA: Diagnosis not present

## 2013-07-23 DIAGNOSIS — M545 Low back pain: Secondary | ICD-10-CM | POA: Diagnosis not present

## 2013-07-23 DIAGNOSIS — M47817 Spondylosis without myelopathy or radiculopathy, lumbosacral region: Secondary | ICD-10-CM | POA: Diagnosis not present

## 2013-07-23 DIAGNOSIS — R1110 Vomiting, unspecified: Secondary | ICD-10-CM | POA: Diagnosis not present

## 2013-07-23 DIAGNOSIS — R112 Nausea with vomiting, unspecified: Secondary | ICD-10-CM | POA: Diagnosis not present

## 2013-07-23 DIAGNOSIS — E1139 Type 2 diabetes mellitus with other diabetic ophthalmic complication: Secondary | ICD-10-CM | POA: Diagnosis not present

## 2013-07-23 DIAGNOSIS — M48061 Spinal stenosis, lumbar region without neurogenic claudication: Secondary | ICD-10-CM | POA: Diagnosis not present

## 2013-07-23 LAB — UIFE/LIGHT CHAINS/TP QN, 24-HR UR
Albumin, U: DETECTED
Alpha 1, Urine: DETECTED — AB
Alpha 2, Urine: DETECTED — AB
Beta, Urine: DETECTED — AB
FREE LAMBDA LT CHAINS, UR: 2.03 mg/dL — AB (ref 0.02–0.67)
Free Kappa Lt Chains,Ur: 5.31 mg/dL — ABNORMAL HIGH (ref 0.14–2.42)
Free Kappa/Lambda Ratio: 2.62 ratio (ref 2.04–10.37)
Gamma Globulin, Urine: DETECTED — AB
Total Protein, Urine: 324.3 mg/dL

## 2013-07-23 LAB — GLUCOSE, CAPILLARY
GLUCOSE-CAPILLARY: 67 mg/dL — AB (ref 70–99)
Glucose-Capillary: 144 mg/dL — ABNORMAL HIGH (ref 70–99)
Glucose-Capillary: 178 mg/dL — ABNORMAL HIGH (ref 70–99)
Glucose-Capillary: 208 mg/dL — ABNORMAL HIGH (ref 70–99)

## 2013-07-23 NOTE — Progress Notes (Signed)
IR team aware of request for Kyphoplasty of L3 compression fracture seen on plain films. MRI has been ordered. IR team does not have IR MD that performs VP/KP on schedule at [facility] this week. Will ask Dr. Estanislado Pandy to review MRI for possibility of procedure to be done at [facility] this week.  Ascencion Dike PA-C Interventional Radiology 07/23/2013 10:09 AM

## 2013-07-23 NOTE — Progress Notes (Addendum)
CSW continuing to follow.  CSW reviewed chart and noted that per MD, MD recommending that pt have an evaluation for possible vertebroplasty versus kyphoplasty, then to a SNF for rehabilitation of gait instability and deconditioning.   CSW attempted to follow up with pt to further discuss SNF placement. Pt sleeping at this time.  CSW will follow up with pt at a later time to determine if pt agreeable to SNF placement following possible procedure.  CSW to continue to follow.  Addendum 2:20 pm:  CSW met with pt at bedside to discuss SNF placement for rehab from MD and PT recommendation.  CSW discussed with pt that CSW aware that pt being evaluated for procedure, but MD feels that pt will need SNF for rehab to assist with pt gait instability and deconditioning . Pt stated that she was aware of procedure, but was not aware of recommendation for SNF.   Pt asked if she would have to stay over night at SNF and CSW explained that pt would stay overnight at SNF, but could sign out during the day to spend time with family. CSW provided support as pt discussed that she wants to return home as soon as possible. CSW expressed understanding and discussed with pt that rehab will assist in pt getting stronger in order for pt to remain at home for longer. Pt is agreeable to [facility] search and then will review options in order to make decision regarding SNF.   CSW initiated SNF search to [facility].  CSW to follow up with pt and pt family with SNF bed offers.   CSW to continue to follow.  Alison Murray, MSW, [place] [employer] [phone]

## 2013-07-23 NOTE — Progress Notes (Signed)
Subjective: No vomiting or regurgitation.  Feeling well.  Objective: Vital signs in last 24 hours: Temp:  [97.3 F (36.3 C)-99.4 F (37.4 C)] 98.1 F (36.7 C) (06/02 1435) Pulse Rate:  [65-85] 65 (06/02 1435) Resp:  [16-18] 18 (06/02 1435) BP: (151-164)/(79-84) 160/79 mmHg (06/02 1435) SpO2:  [94 %-98 %] 98 % (06/02 1435) Last BM Date: 07/22/13  Intake/Output from previous day: 06/01 0701 - 06/02 0700 In: 1740 [P.O.:480; I.V.:1260] Out: -  Intake/Output this shift: Total I/O In: 1249 [P.O.:720; I.V.:529] Out: 200 [Urine:200]  General appearance: sleepy, but arousable GI: soft, non-tender; bowel sounds normal; no masses,  no organomegaly  Lab Results:  Recent Labs  07/22/13 0406  WBC 7.8  HGB 12.8  HCT 37.6  PLT PLATELET CLUMPS NOTED ON SMEAR, COUNT APPEARS ADEQUATE   BMET  Recent Labs  07/22/13 0406  NA 142  K 3.4*  CL 102  CO2 25  GLUCOSE 227*  BUN 11  CREATININE 0.81  CALCIUM 9.7   LFT  Recent Labs  07/22/13 0406  PROT 7.4  ALBUMIN 3.8  AST 15  ALT 12  ALKPHOS 66  BILITOT 0.8   PT/INR No results found for this basename: LABPROT, INR,  in the last 72 hours Hepatitis Panel No results found for this basename: HEPBSAG, HCVAB, HEPAIGM, HEPBIGM,  in the last 72 hours C-Diff No results found for this basename: CDIFFTOX,  in the last 72 hours Fecal Lactopherrin No results found for this basename: FECLLACTOFRN,  in the last 72 hours  Studies/Results: Mr Lumbar Spine Wo Contrast  07/23/2013   CLINICAL DATA:  Fall 2 weeks ago.  Back pain.  EXAM: MRI LUMBAR SPINE WITHOUT CONTRAST  TECHNIQUE: Multiplanar, multisequence MR imaging of the lumbar spine was performed. No intravenous contrast was administered.  COMPARISON:  07/19/2013 plain film exam of the lumbar spine. 02/06/2012 MR.  FINDINGS: Transitional appearance of what has previously been labeled the S1 vertebral body places a rudimentary disc at the S1-2 level. Utilizing this level assignment, the  present examination incorporates from the T12 through the lower sacrum.  The present examination is significantly motion degraded. The patient was not able to hold still despite sedation. Restless leg syndrome.  Conus L2 level.  Mild curvature lumbar spine convex to the left.  Remote Schmorl's node deformity superior and inferior endplate of L1 with 99% loss of height centrally with minimal retropulsion posterior inferior aspect of the L1 vertebral body combined with L1-2 disk bulge/ spur causes mild spinal stenosis.  New L3 superior endplate compression fracture with 20% maximal loss of height greater on the right. No significant retropulsion. Small amount of perivertebral edema/hemorrhage suspected although incompletely assessed.  T12-L1: Minimal bulge greater to left. Minimal facet joint degenerative changes.  L1-2: As above.  L2-3: As above. Minimal bulge. Minimal to mild facet joint degenerative changes.  L3-4: Broad-based disc osteophyte complex. Facet joint degenerative changes. Mild spinal stenosis and mild bilateral foraminal narrowing.  L4-5: Broad-based disc osteophyte complex greater to the right. Prior surgery. Facet joint bony overgrowth. Moderate spinal stenosis and lateral recess stenosis greater on the right. Mild to moderate foraminal narrowing greater on the right.  L5-S1: Prominent facet joint degenerative changes. Ligamentum flavum hypertrophy. Broad-based disc osteophyte complex. Multifactorial moderate to marked spinal stenosis with moderate bilateral foraminal narrowing.  S1-2: Rudimentary disc.  IMPRESSION: Transitional appearance of what has previously been labeled the S1 vertebral body places a rudimentary disc at the S1-2 level. Utilizing this level assignment, the present examination incorporates  from the T12 through the lower sacrum.  The present examination is significantly motion degraded. The patient was not able to hold still despite sedation. Restless leg syndrome.  Mild curvature  lumbar spine convex to the left.  Remote Schmorl's node deformity superior and inferior endplate of L1 with 56% loss of height centrally with minimal retropulsion posterior inferior aspect of the L1 vertebral body combined with L1-2 disk bulge/ spur causes mild spinal stenosis.  New L3 superior endplate compression fracture with 20% maximal loss of height greater on the right. No significant retropulsion. Small amount of perivertebral edema/hemorrhage suspected although incompletely assessed.  L3-4 multifactorial mild spinal stenosis and mild bilateral foraminal narrowing.  L4-5 multifactorial moderate spinal stenosis and lateral recess stenosis greater on the right. Mild to moderate foraminal narrowing greater on the right.  L5-S1 multifactorial moderate to marked spinal stenosis with moderate bilateral foraminal narrowing.   Electronically Signed   By: Chauncey Cruel M.D.   On: 07/23/2013 11:57    Medications:  Scheduled: . enoxaparin (LOVENOX) injection  40 mg Subcutaneous Q24H  . glipiZIDE  5 mg Oral BID WC  . insulin aspart  0-15 Units Subcutaneous TID WC  . insulin aspart  0-5 Units Subcutaneous QHS  . losartan  100 mg Oral Daily  . magnesium hydroxide  30 mL Oral Daily  . metoCLOPramide  5 mg Oral TID AC & HS  . metoprolol tartrate  12.5 mg Oral BID  . polyethylene glycol  17 g Oral Daily  . pramipexole  0.25 mg Oral BID  . senna-docusate  1 tablet Oral q morning - 10a  . verapamil  80 mg Oral Q12H   Continuous: . 0.9 % NaCl with KCl 20 mEq / L 40 mL/hr at 07/21/13 1800    Assessment/Plan: 1) Nausea/vomiting.   Clinically she is well and no complaints at this time with nausea and vomiting.    Plan: 1) No active GI intervention at this time. 2)  Continue with supportive care. 3) Signing off.  LOS: 5 days   Beryle Beams 07/23/2013, 4:23 PM

## 2013-07-23 NOTE — Progress Notes (Addendum)
Clinical Social Work Department CLINICAL SOCIAL WORK PLACEMENT NOTE 07/23/2013  Patient:  [name], [name]  Account Number:  [id number] Admit date:  07/18/2013  Clinical Social Worker:  Ulyess Blossom  Date/time:  07/23/2013 03:00 PM  Clinical Social Work is seeking post-discharge placement for this patient at the following level of care:   [place]   (*CSW will update this form in Epic as items are completed)   07/23/2013  Patient/family provided with [place] Department of Clinical Social Work's list of facilities offering this level of care within the geographic area requested by the patient (or if unable, by the patient's family).  07/23/2013  Patient/family informed of their freedom to choose among providers that offer the needed level of care, that participate in Medicare, Medicaid or managed care program needed by the patient, have an available bed and are willing to accept the patient.  07/23/2013  Patient/family informed of [facility]' ownership interest in [facility], as well as of the fact that they are under no obligation to receive care at this facility.  PASARR submitted to EDS on 07/23/2013 PASARR number received from EDS on 07/23/2013  FL2 transmitted to all facilities in geographic area requested by pt/family on  07/23/2013 FL2 transmitted to all facilities within larger geographic area on   Patient informed that his/her managed care company has contracts with or will negotiate with  certain facilities, including the following:     Patient/family informed of bed offers received:  07/24/2013 Patient chooses bed at [facility] Physician recommends and patient chooses bed at    Patient to be transferred to  on   Patient to be transferred to facility by   The following physician request were entered in Epic:   Additional Comments:    Alison Murray, MSW, [place] [facility] [phone]

## 2013-07-23 NOTE — Progress Notes (Signed)
Subjective: Having moderate low back pain with any movement, ate breakfast without regurgitation today. She is anxious with discussion about possible kyphoplasty versus vertebroplasty. I recommended the procedure and indicated that the experience of most patients is that the back pain is much improved after the procedure.   Objective: Vital signs in last 24 hours: Temp:  [97.3 F (36.3 C)-99.4 F (37.4 C)] 97.3 F (36.3 C) (06/02 0507) Pulse Rate:  [69-87] 69 (06/02 0507) Resp:  [16-18] 16 (06/02 0507) BP: (151-180)/(82-84) 164/84 mmHg (06/02 0507) SpO2:  [94 %-98 %] 97 % (06/02 0507) Weight change:    Intake/Output from previous day: 06/01 0701 - 06/02 0700 In: 1740 [P.O.:480; I.V.:1260] Out: -    General appearance: alert, cooperative and mildly anxious about possible kyphoplasty versus vertebroplasty Resp: clear to auscultation bilaterally Cardio: regular rate and rhythm GI: soft, non-tender; bowel sounds normal; no masses,  no organomegaly Extremities: extremities normal, atraumatic, no cyanosis or edema  Lab Results:  Recent Labs  07/22/13 0406  WBC 7.8  HGB 12.8  HCT 37.6  PLT PLATELET CLUMPS NOTED ON SMEAR, COUNT APPEARS ADEQUATE   BMET  Recent Labs  07/22/13 0406  NA 142  K 3.4*  CL 102  CO2 25  GLUCOSE 227*  BUN 11  CREATININE 0.81  CALCIUM 9.7   CMET CMP     Component Value Date/Time   NA 142 07/22/2013 0406   K 3.4* 07/22/2013 0406   CL 102 07/22/2013 0406   CO2 25 07/22/2013 0406   GLUCOSE 227* 07/22/2013 0406   BUN 11 07/22/2013 0406   CREATININE 0.81 07/22/2013 0406   CALCIUM 9.7 07/22/2013 0406   PROT 7.4 07/22/2013 0406   ALBUMIN 3.8 07/22/2013 0406   AST 15 07/22/2013 0406   ALT 12 07/22/2013 0406   ALKPHOS 66 07/22/2013 0406   BILITOT 0.8 07/22/2013 0406   GFRNONAA 70* 07/22/2013 0406   GFRAA 81* 07/22/2013 0406    CBG (last 3)   Recent Labs  07/22/13 1719 07/22/13 2058 07/23/13 0742  GLUCAP 162* 160* 144*    INR RESULTS:   Lab Results   Component Value Date   INR 1.01 10/07/2010     Studies/Results: No results found.  Medications: I have reviewed the patient's current medications.  Assessment/Plan: #1 Low Back Pain: due to new L3 compression fracture. I do not feel that she would do well with use of a spine corset so I have recommended that she have an evaluation for possible vertebroplasty versus kyphoplasty, then to a SNF for rehabilitation of gait instability and deconditioning.  #2 Regurgitation: improved and not likely due to GERD given UGI with SBFT results. Doing better with low dose reglan. #3 DM2: stable on SSI   LOS: 5 days   [name] 07/23/2013, 9:02 AM

## 2013-07-24 ENCOUNTER — Encounter (HOSPITAL_COMMUNITY): Payer: Self-pay | Admitting: Radiology

## 2013-07-24 DIAGNOSIS — R1110 Vomiting, unspecified: Secondary | ICD-10-CM | POA: Diagnosis not present

## 2013-07-24 DIAGNOSIS — E1139 Type 2 diabetes mellitus with other diabetic ophthalmic complication: Secondary | ICD-10-CM | POA: Diagnosis not present

## 2013-07-24 DIAGNOSIS — M545 Low back pain: Secondary | ICD-10-CM | POA: Diagnosis not present

## 2013-07-24 LAB — PROTEIN ELECTROPHORESIS, SERUM
ALPHA-2-GLOBULIN: 10.6 % (ref 7.1–11.8)
Albumin ELP: 53.7 % — ABNORMAL LOW (ref 55.8–66.1)
Alpha-1-Globulin: 6.7 % — ABNORMAL HIGH (ref 2.9–4.9)
Beta 2: 6.3 % (ref 3.2–6.5)
Beta Globulin: 7.2 % (ref 4.7–7.2)
Gamma Globulin: 15.5 % (ref 11.1–18.8)
M-SPIKE, %: NOT DETECTED g/dL
TOTAL PROTEIN ELP: 6.9 g/dL (ref 6.0–8.3)

## 2013-07-24 LAB — CBC
HEMATOCRIT: 35 % — AB (ref 36.0–46.0)
Hemoglobin: 11.6 g/dL — ABNORMAL LOW (ref 12.0–15.0)
MCH: 27 pg (ref 26.0–34.0)
MCHC: 33.1 g/dL (ref 30.0–36.0)
MCV: 81.4 fL (ref 78.0–100.0)
Platelets: 212 10*3/uL (ref 150–400)
RBC: 4.3 MIL/uL (ref 3.87–5.11)
RDW: 14.1 % (ref 11.5–15.5)
WBC: 5.5 10*3/uL (ref 4.0–10.5)

## 2013-07-24 LAB — GLUCOSE, CAPILLARY
GLUCOSE-CAPILLARY: 131 mg/dL — AB (ref 70–99)
GLUCOSE-CAPILLARY: 270 mg/dL — AB (ref 70–99)
Glucose-Capillary: 287 mg/dL — ABNORMAL HIGH (ref 70–99)

## 2013-07-24 LAB — PROTIME-INR
INR: 1.01 (ref 0.00–1.49)
Prothrombin Time: 13.1 seconds (ref 11.6–15.2)

## 2013-07-24 LAB — APTT: APTT: 35 s (ref 24–37)

## 2013-07-24 NOTE — Progress Notes (Signed)
Subjective: Comfortable lying supine, no longer has constipation, has moderate low back pain with sitting or standing  Objective: Vital signs in last 24 hours: Temp:  [97.8 F (36.6 C)-98.8 F (37.1 C)] 97.8 F (36.6 C) (06/03 0540) Pulse Rate:  [62-73] 62 (06/03 0540) Resp:  [17-18] 18 (06/03 0540) BP: (146-165)/(76-84) 146/76 mmHg (06/03 0540) SpO2:  [97 %-100 %] 100 % (06/03 0540) Weight change:    Intake/Output from previous day: 06/02 0701 - 06/03 0700 In: 1489 [P.O.:960; I.V.:529] Out: 200 [Urine:200]   General appearance: alert, cooperative and no distress Resp: clear to auscultation bilaterally Cardio: regular rate and rhythm GI: soft, non-tender; bowel sounds normal; no masses,  no organomegaly Extremities: extremities normal, atraumatic, no cyanosis or edema  Lab Results:  Recent Labs  07/22/13 0406 07/24/13 0321  WBC 7.8 5.5  HGB 12.8 11.6*  HCT 37.6 35.0*  PLT PLATELET CLUMPS NOTED ON SMEAR, COUNT APPEARS ADEQUATE 212   BMET  Recent Labs  07/22/13 0406  NA 142  K 3.4*  CL 102  CO2 25  GLUCOSE 227*  BUN 11  CREATININE 0.81  CALCIUM 9.7   CMET CMP     Component Value Date/Time   NA 142 07/22/2013 0406   K 3.4* 07/22/2013 0406   CL 102 07/22/2013 0406   CO2 25 07/22/2013 0406   GLUCOSE 227* 07/22/2013 0406   BUN 11 07/22/2013 0406   CREATININE 0.81 07/22/2013 0406   CALCIUM 9.7 07/22/2013 0406   PROT 7.4 07/22/2013 0406   ALBUMIN 3.8 07/22/2013 0406   AST 15 07/22/2013 0406   ALT 12 07/22/2013 0406   ALKPHOS 66 07/22/2013 0406   BILITOT 0.8 07/22/2013 0406   GFRNONAA 70* 07/22/2013 0406   GFRAA 81* 07/22/2013 0406    CBG (last 3)   Recent Labs  07/23/13 1200 07/23/13 1735 07/23/13 2118  GLUCAP 178* 67* 208*    INR RESULTS:   Lab Results  Component Value Date   INR 1.01 07/24/2013   INR 1.01 10/07/2010     Studies/Results: Mr Lumbar Spine Wo Contrast  07/23/2013   CLINICAL DATA:  Fall 2 weeks ago.  Back pain.  EXAM: MRI LUMBAR SPINE WITHOUT CONTRAST   TECHNIQUE: Multiplanar, multisequence MR imaging of the lumbar spine was performed. No intravenous contrast was administered.  COMPARISON:  07/19/2013 plain film exam of the lumbar spine. 02/06/2012 MR.  FINDINGS: Transitional appearance of what has previously been labeled the S1 vertebral body places a rudimentary disc at the S1-2 level. Utilizing this level assignment, the present examination incorporates from the T12 through the lower sacrum.  The present examination is significantly motion degraded. The patient was not able to hold still despite sedation. Restless leg syndrome.  Conus L2 level.  Mild curvature lumbar spine convex to the left.  Remote Schmorl's node deformity superior and inferior endplate of L1 with 10% loss of height centrally with minimal retropulsion posterior inferior aspect of the L1 vertebral body combined with L1-2 disk bulge/ spur causes mild spinal stenosis.  New L3 superior endplate compression fracture with 20% maximal loss of height greater on the right. No significant retropulsion. Small amount of perivertebral edema/hemorrhage suspected although incompletely assessed.  T12-L1: Minimal bulge greater to left. Minimal facet joint degenerative changes.  L1-2: As above.  L2-3: As above. Minimal bulge. Minimal to mild facet joint degenerative changes.  L3-4: Broad-based disc osteophyte complex. Facet joint degenerative changes. Mild spinal stenosis and mild bilateral foraminal narrowing.  L4-5: Broad-based disc osteophyte complex greater  to the right. Prior surgery. Facet joint bony overgrowth. Moderate spinal stenosis and lateral recess stenosis greater on the right. Mild to moderate foraminal narrowing greater on the right.  L5-S1: Prominent facet joint degenerative changes. Ligamentum flavum hypertrophy. Broad-based disc osteophyte complex. Multifactorial moderate to marked spinal stenosis with moderate bilateral foraminal narrowing.  S1-2: Rudimentary disc.  IMPRESSION: Transitional  appearance of what has previously been labeled the S1 vertebral body places a rudimentary disc at the S1-2 level. Utilizing this level assignment, the present examination incorporates from the T12 through the lower sacrum.  The present examination is significantly motion degraded. The patient was not able to hold still despite sedation. Restless leg syndrome.  Mild curvature lumbar spine convex to the left.  Remote Schmorl's node deformity superior and inferior endplate of L1 with 58% loss of height centrally with minimal retropulsion posterior inferior aspect of the L1 vertebral body combined with L1-2 disk bulge/ spur causes mild spinal stenosis.  New L3 superior endplate compression fracture with 20% maximal loss of height greater on the right. No significant retropulsion. Small amount of perivertebral edema/hemorrhage suspected although incompletely assessed.  L3-4 multifactorial mild spinal stenosis and mild bilateral foraminal narrowing.  L4-5 multifactorial moderate spinal stenosis and lateral recess stenosis greater on the right. Mild to moderate foraminal narrowing greater on the right.  L5-S1 multifactorial moderate to marked spinal stenosis with moderate bilateral foraminal narrowing.   Electronically Signed   By: Chauncey Cruel M.D.   On: 07/23/2013 11:57    Medications: I have reviewed the patient's current medications.  Assessment/Plan: #1 L3 Compression fracture: likely source of acute low back pain, and patient could be transported to [facility] for a vertebroplasty or kyphoplasty procedure if necessary. Anticipate discharge to a SNF the day following the procedure. #2 Vomiting: resolved #3 DM2: stable on SSI   LOS: 6 days   [name] 07/24/2013, 7:48 AM

## 2013-07-24 NOTE — Progress Notes (Signed)
CSW continuing to follow for disposition planning.  CSW met with pt at bedside to provide SNF bed offers. CSW discussed options with pt and clarified pt questions. CSW provided support to pt as she discussed that she learned today that MD's plan to perform kyphoplasty procedure. Pt is hopeful that the procedure will relieve her back pain and that eventually she will be able to walk again without her walker. CSW assisted pt in writing down the name of the PA that saw her this morning regarding procedure as she plans to discuss procedure with pt husband and pt daughter. Pt shared that she does plan to go to SNF for rehab and is attempting to decide if she would like to return to [facility] where she has been in the past and is familiar with versus going to a new facility for rehab. Pt plans to discuss options with pt husband and pt daughter in order to make a decision regarding SNF.   CSW to follow up with pt regarding decision for SNF.   CSW to continue to follow and provide support and assist with pt discharge planning needs.   Amelita Risinger, MSW, LCSW Clinical Social Work [phone] 

## 2013-07-24 NOTE — Care Management Note (Signed)
    Page 1 of 1   07/24/2013     8:39:37 AM CARE MANAGEMENT NOTE 07/24/2013  Patient:  [name], [name]   Account Number:  [id number]  Date Initiated:  07/19/2013  Documentation initiated by:  DAVIS,RHONDA  Subjective/Objective Assessment:   new onset of unstready gait with nuasea and vomiting after recent fall/     Action/Plan:   home versus snf   Anticipated DC Date:  07/22/2013   Anticipated DC Plan:  [place]         Choice offered to / List presented to:             Status of service:  Completed, signed off Medicare Important Message given?  YES (If response is "NO", the following Medicare IM given date fields will be blank) Date Medicare IM given:  07/22/2013 Date Additional Medicare IM given:    Discharge Disposition:  [place]  Per UR Regulation:  Reviewed for med. necessity/level of care/duration of stay  If discussed at [place] of Stay Meetings, dates discussed:    Comments:  07/24/1505:45  CM reviewed; CSW arranging SNF placement, [place] [facility].  No other CM needs were communicated. Mariane Masters, BSN, [place] [phone].  [phone] Rosana Hoes, [place], [place], CCM [phone] Chart Reviewed for discharge and hospital needs. Discharge needs at time of review: None present will follow for needs.  daughter refused snf placement until after orthopod consult and go consults are done both are pending/ Review of patient progress due on [id number].   [id number] Rosana Hoes, RN, BSN, CCM [phone] Chart Reviewed for discharge and hospital needs. Discharge needs at time of review: None present will follow for needs. Review of patient progress due on [id number].

## 2013-07-24 NOTE — Consult Note (Signed)
Reason for Consult:L3 compression fracture, request for vertebral augmentation Consulting Radiologist: Deveshwar Referring Physician: Dr. Philip Aspen   HPI: [name] is an 73 y.o. female who suffered a fall a few weeks ago and has had mid low back pain ever since. She does have a history of chronic back pain as well and has undergone Laminectomy and microdiscectomy of L3-S1 last February by Dr Christella Noa. This lowwer back is new since the fall though. She has now been admitted and work up, including MRI,  finds a new compression fracture at the L3 level. IR is asked to evaluate for potential VP/KP as treatment. Chart, PMHx, meds, imaging reviewed. Pt currently sitting up in chair but c/o back pain with some radiation around her waist. She denies loss of bowel or bladder function and she denies any radiating pain to he legs.    Past Medical History:  Past Medical History  Diagnosis Date  . Anxiety   . Dyspnea   . Diabetes mellitus   . Osteoarthritis   . Hypercholesteremia   . Depression   . RLS (restless legs syndrome)   . Stress   . PONV (postoperative nausea and vomiting)   . Headache(784.0)   . HTN (hypertension)     dr Johnsie Cancel    Surgical History:  Past Surgical History  Procedure Laterality Date  . Colonoscopy    . Abdominal hysterectomy    . Lumbar laminectomy/decompression microdiscectomy N/A 04/18/2012    Procedure: LUMBAR LAMINECTOMY/DECOMPRESSION MICRODISCECTOMY 3 LEVELS;  Surgeon: Winfield Cunas, MD;  Location: [facility];  Service: Neurosurgery;  Laterality: N/A;  Lumbar three-four, lumbar four-five, lumbar five-sacral one decompression. Synovial cyst resection lumbar four-five  . Joint replacement    . Left forearm fracture with orif Left     Family History:  Family History  Problem Relation Age of Onset  . Diabetes Father   . High blood pressure Father   . High blood pressure Mother     Social History:  reports that she has never smoked. She has  never used smokeless tobacco. She reports that she does not drink alcohol or use illicit drugs.  Allergies:  Allergies  Allergen Reactions  . Morphine And Related Other (See Comments)    hallucination   . Oxycodone Other (See Comments)    hallucination   . Sulfonamide Derivatives Swelling    Medications: Current facility-administered medications:0.9 % NaCl with KCl 20 mEq/ L  infusion, , Intravenous, Continuous, Jerlyn Ly, MD, Last Rate: 40 mL/hr at 07/23/13 2034;  acetaminophen (TYLENOL) tablet 1,000 mg, 1,000 mg, Oral, Q6H PRN, [name], MD, 1,000 mg at 07/23/13 2223;  ALPRAZolam Duanne Moron) tablet 0.5 mg, 0.5 mg, Oral, BID PRN, [name], MD, 0.5 mg at 07/23/13 2223 bisacodyl (DULCOLAX) suppository 10 mg, 10 mg, Rectal, Daily PRN, [name], MD;  enoxaparin (LOVENOX) injection 40 mg, 40 mg, Subcutaneous, Q24H, [name], MD, 40 mg at 07/23/13 2127;  glipiZIDE (GLUCOTROL XL) 24 hr tablet 5 mg, 5 mg, Oral, BID WC, [name], MD, 5 mg at 07/24/13 0712;  insulin aspart (novoLOG) injection 0-15 Units, 0-15 Units, Subcutaneous, TID WC, [name], MD, 2 Units at 07/24/13 1975 insulin aspart (novoLOG) injection 0-5 Units, 0-5 Units, Subcutaneous, QHS, [name], MD, 2 Units at 07/23/13 2246;  losartan (COZAAR) tablet 100 mg, 100 mg, Oral, Daily, [name], MD, 100 mg at 07/24/13 8832;  magnesium hydroxide (MILK OF MAGNESIA) suspension 30 mL, 30 mL, Oral, Daily, [name], MD, 30 mL at 07/23/13 1000;  metoCLOPramide (REGLAN) tablet 5 mg, 5 mg, Oral, TID AC & HS, Jerlyn Ly, MD, 5 mg at 07/24/13 0824 metoprolol tartrate (LOPRESSOR) tablet 12.5 mg, 12.5 mg, Oral, BID, [name], MD, 12.5 mg at 07/24/13 0958;  ondansetron (ZOFRAN) 8 mg/NS 50 ml IVPB, 8 mg, Intravenous, Q6H PRN, Ravisankar R Avva, MD;  ondansetron (ZOFRAN) tablet 8 mg, 8 mg, Oral, Q6H PRN, Ravisankar R Avva, MD;  polyethylene glycol (MIRALAX / GLYCOLAX) packet 17 g, 17 g, Oral,  Daily, [name], MD, 17 g at 07/22/13 1019 pramipexole (MIRAPEX) tablet 0.25 mg, 0.25 mg, Oral, BID, [name], MD, 0.25 mg at 07/24/13 1002;  senna-docusate (Senokot-S) tablet 1 tablet, 1 tablet, Oral, QHS PRN, [name], MD;  senna-docusate (Senokot-S) tablet 1 tablet, 1 tablet, Oral, q morning - 10a, [name], MD, 1 tablet at 07/24/13 0959;  traMADol (ULTRAM) tablet 50 mg, 50 mg, Oral, Q6H PRN, Jerlyn Ly, MD, 50 mg at 07/23/13 1000 verapamil (CALAN) tablet 80 mg, 80 mg, Oral, Q12H, [name], MD, 80 mg at 07/24/13 0959   ROS: See HPI for pertinent findings, otherwise complete 10 system review negative.  Physical Exam: Blood pressure 146/76, pulse 62, temperature 97.8 F (36.6 C), temperature source Oral, resp. rate 18, height 5' 7" (1.702 m), weight 218 lb 0.6 oz (98.9 kg), SpO2 100.00%. General: Elderly appearing AA female. No acute distress but doesn't appear comfortable sitting up in chair ENT: unremarkable airway Lungs: CTA without w/r/r Heart: Regular Back: Prior L_S surgical scar noted, well healed. Pt with some paraspinal tenderness at the mid-lumbar region.   Labs: CBC  Recent Labs  07/22/13 0406 07/24/13 0321  WBC 7.8 5.5  HGB 12.8 11.6*  HCT 37.6 35.0*  PLT PLATELET CLUMPS NOTED ON SMEAR, COUNT APPEARS ADEQUATE 212   MET  Recent Labs  07/22/13 0406  NA 142  K 3.4*  CL 102  CO2 25  GLUCOSE 227*  BUN 11  CREATININE 0.81  CALCIUM 9.7    Recent Labs  07/22/13 0406  PROT 7.4  ALBUMIN 3.8  AST 15  ALT 12  ALKPHOS 66  BILITOT 0.8   PT/INR  Recent Labs  07/24/13 0321  LABPROT 13.1  INR 1.01   ABG No results found for this basename: PHART, PCO2, PO2, HCO3,  in the last 72 hours    Mr Lumbar Spine Wo Contrast  07/23/2013   CLINICAL DATA:  Fall 2 weeks ago.  Back pain.  EXAM: MRI LUMBAR SPINE WITHOUT CONTRAST  TECHNIQUE: Multiplanar, multisequence MR imaging of the lumbar spine was performed. No intravenous  contrast was administered.  COMPARISON:  07/19/2013 plain film exam of the lumbar spine. 02/06/2012 MR.  FINDINGS: Transitional appearance of what has previously been labeled the S1 vertebral body places a rudimentary disc at the S1-2 level. Utilizing this level assignment, the present examination incorporates from the T12 through the lower sacrum.  The present examination is significantly motion degraded. The patient was not able to hold still despite sedation. Restless leg syndrome.  Conus L2 level.  Mild curvature lumbar spine convex to the left.  Remote Schmorl's node deformity superior and inferior endplate of L1 with 32% loss of height centrally with minimal retropulsion posterior inferior aspect of the L1 vertebral body combined with L1-2 disk bulge/ spur causes mild spinal stenosis.  New L3 superior endplate compression fracture with 20% maximal loss of height greater on the right. No significant retropulsion. Small amount of perivertebral edema/hemorrhage suspected although incompletely assessed.  T12-L1: Minimal  bulge greater to left. Minimal facet joint degenerative changes.  L1-2: As above.  L2-3: As above. Minimal bulge. Minimal to mild facet joint degenerative changes.  L3-4: Broad-based disc osteophyte complex. Facet joint degenerative changes. Mild spinal stenosis and mild bilateral foraminal narrowing.  L4-5: Broad-based disc osteophyte complex greater to the right. Prior surgery. Facet joint bony overgrowth. Moderate spinal stenosis and lateral recess stenosis greater on the right. Mild to moderate foraminal narrowing greater on the right.  L5-S1: Prominent facet joint degenerative changes. Ligamentum flavum hypertrophy. Broad-based disc osteophyte complex. Multifactorial moderate to marked spinal stenosis with moderate bilateral foraminal narrowing.  S1-2: Rudimentary disc.  IMPRESSION: Transitional appearance of what has previously been labeled the S1 vertebral body places a rudimentary disc at  the S1-2 level. Utilizing this level assignment, the present examination incorporates from the T12 through the lower sacrum.  The present examination is significantly motion degraded. The patient was not able to hold still despite sedation. Restless leg syndrome.  Mild curvature lumbar spine convex to the left.  Remote Schmorl's node deformity superior and inferior endplate of L1 with 87% loss of height centrally with minimal retropulsion posterior inferior aspect of the L1 vertebral body combined with L1-2 disk bulge/ spur causes mild spinal stenosis.  New L3 superior endplate compression fracture with 20% maximal loss of height greater on the right. No significant retropulsion. Small amount of perivertebral edema/hemorrhage suspected although incompletely assessed.  L3-4 multifactorial mild spinal stenosis and mild bilateral foraminal narrowing.  L4-5 multifactorial moderate spinal stenosis and lateral recess stenosis greater on the right. Mild to moderate foraminal narrowing greater on the right.  L5-S1 multifactorial moderate to marked spinal stenosis with moderate bilateral foraminal narrowing.   Electronically Signed   By: Chauncey Cruel M.D.   On: 07/23/2013 11:57    Assessment/Plan: Acute L3 compression fracture. MRI and case reviewed by Dr. Estanislado Pandy who feels she is a good candidate for Kyphoplasty procedure of this fracture. Gave pt brochure and thoroughly discussed the procedure as well. Explained risks and complications including infection, bleeding, side effects of sedation, as well as post-procedure expectations. Dr. Patsey Berthold can do this procedure Friday morning. Will need to transport pt via [facility] to [facility] for procedure. Per pt, she wants her husband to be able to be there for procedure, however, he is on HD M-W-F from 6a-10a and uses [facility] for transportation. Will look at Dr. Estanislado Pandy schedule to see if procedure can be done later in the morning, ie 1100 or 1200. Labs reviewed, ok. Pt will  further discuss with her husband and daughter. Have not obtained consent yet. Tentatively plan for procedure Friday 6/5.  Ascencion Dike PA-C 07/24/2013, 11:26 AM

## 2013-07-25 DIAGNOSIS — E1139 Type 2 diabetes mellitus with other diabetic ophthalmic complication: Secondary | ICD-10-CM | POA: Diagnosis not present

## 2013-07-25 DIAGNOSIS — S32009A Unspecified fracture of unspecified lumbar vertebra, initial encounter for closed fracture: Secondary | ICD-10-CM | POA: Diagnosis not present

## 2013-07-25 DIAGNOSIS — M545 Low back pain: Secondary | ICD-10-CM | POA: Diagnosis not present

## 2013-07-25 DIAGNOSIS — I10 Essential (primary) hypertension: Secondary | ICD-10-CM | POA: Diagnosis not present

## 2013-07-25 LAB — CREATININE, SERUM
Creatinine, Ser: 0.68 mg/dL (ref 0.50–1.10)
GFR calc Af Amer: >90 mL/min (ref >90)
GFR calc non Af Amer: 84 mL/min — ABNORMAL LOW (ref >90)

## 2013-07-25 LAB — GLUCOSE, CAPILLARY
Glucose-Capillary: 198 mg/dL — ABNORMAL HIGH (ref 70–99)
Glucose-Capillary: 263 mg/dL — ABNORMAL HIGH (ref 70–99)
Glucose-Capillary: 204 mg/dL — ABNORMAL HIGH (ref 70–99)

## 2013-07-25 LAB — SURGICAL PCR SCREEN
MRSA, PCR: NEGATIVE
Staphylococcus aureus: NEGATIVE

## 2013-07-25 MED ORDER — CEFAZOLIN SODIUM-DEXTROSE 2-3 GM-% IV SOLR
2.0000 g | INTRAVENOUS | Status: AC
  Filled 2013-07-25 (×2): qty 50

## 2013-07-25 MED ORDER — MAGNESIUM HYDROXIDE 400 MG/5ML PO SUSP
30.0000 mL | Freq: Once | ORAL | Status: AC
  Administered 2013-07-25: 30 mL via ORAL

## 2013-07-25 NOTE — Progress Notes (Signed)
Delivered prayer shawl. Pt and daughter very appreciative. Ernest Haber Chaplain  07/25/13 1300  Clinical Encounter Type  Visited With Family

## 2013-07-25 NOTE — Progress Notes (Signed)
Subjective: Appreciate evaluation by neuroradiologist, and briefly re-explained the procedure to the patient, her daughter, and her husband. She continues to have moderate low back pain with walking with a walker and has occasional regurgitation of food.  Objective: Vital signs in last 24 hours: Temp:  [98.4 F (36.9 C)-98.5 F (36.9 C)] 98.4 F (36.9 C) (06/04 0530) Pulse Rate:  [69-75] 69 (06/04 0530) Resp:  [16] 16 (06/04 0530) BP: (156-178)/(82-92) 158/92 mmHg (06/04 0530) SpO2:  [96 %-99 %] 98 % (06/04 0530) Weight change:    Intake/Output from previous day: 06/03 0701 - 06/04 0700 In: 2110 [P.O.:480; I.V.:1630] Out: 1050 [Urine:1050]   General appearance: alert, cooperative and no distress Resp: clear to auscultation bilaterally Cardio: regular rate and rhythm, S1, S2 normal, no murmur, click, rub or gallop GI: soft, non-tender; bowel sounds normal; no masses,  no organomegaly Extremities: extremities normal, atraumatic, no cyanosis or edema  Lab Results:  Recent Labs  07/24/13 0321  WBC 5.5  HGB 11.6*  HCT 35.0*  PLT 212   BMET  Recent Labs  07/25/13 0337  CREATININE 0.68   CMET CMP     Component Value Date/Time   NA 142 07/22/2013 0406   K 3.4* 07/22/2013 0406   CL 102 07/22/2013 0406   CO2 25 07/22/2013 0406   GLUCOSE 227* 07/22/2013 0406   BUN 11 07/22/2013 0406   CREATININE 0.68 07/25/2013 0337   CALCIUM 9.7 07/22/2013 0406   PROT 7.4 07/22/2013 0406   ALBUMIN 3.8 07/22/2013 0406   AST 15 07/22/2013 0406   ALT 12 07/22/2013 0406   ALKPHOS 66 07/22/2013 0406   BILITOT 0.8 07/22/2013 0406   GFRNONAA 84* 07/25/2013 0337   GFRAA >90 07/25/2013 0337    CBG (last 3)   Recent Labs  07/24/13 0815 07/24/13 1230 07/24/13 2159  GLUCAP 131* 287* 270*    INR RESULTS:   Lab Results  Component Value Date   INR 1.01 07/24/2013   INR 1.01 10/07/2010     Studies/Results: Mr Lumbar Spine Wo Contrast  07/23/2013   CLINICAL DATA:  Fall 2 weeks ago.  Back pain.  EXAM: MRI  LUMBAR SPINE WITHOUT CONTRAST  TECHNIQUE: Multiplanar, multisequence MR imaging of the lumbar spine was performed. No intravenous contrast was administered.  COMPARISON:  07/19/2013 plain film exam of the lumbar spine. 02/06/2012 MR.  FINDINGS: Transitional appearance of what has previously been labeled the S1 vertebral body places a rudimentary disc at the S1-2 level. Utilizing this level assignment, the present examination incorporates from the T12 through the lower sacrum.  The present examination is significantly motion degraded. The patient was not able to hold still despite sedation. Restless leg syndrome.  Conus L2 level.  Mild curvature lumbar spine convex to the left.  Remote Schmorl's node deformity superior and inferior endplate of L1 with 56% loss of height centrally with minimal retropulsion posterior inferior aspect of the L1 vertebral body combined with L1-2 disk bulge/ spur causes mild spinal stenosis.  New L3 superior endplate compression fracture with 20% maximal loss of height greater on the right. No significant retropulsion. Small amount of perivertebral edema/hemorrhage suspected although incompletely assessed.  T12-L1: Minimal bulge greater to left. Minimal facet joint degenerative changes.  L1-2: As above.  L2-3: As above. Minimal bulge. Minimal to mild facet joint degenerative changes.  L3-4: Broad-based disc osteophyte complex. Facet joint degenerative changes. Mild spinal stenosis and mild bilateral foraminal narrowing.  L4-5: Broad-based disc osteophyte complex greater to the right. Prior surgery.  Facet joint bony overgrowth. Moderate spinal stenosis and lateral recess stenosis greater on the right. Mild to moderate foraminal narrowing greater on the right.  L5-S1: Prominent facet joint degenerative changes. Ligamentum flavum hypertrophy. Broad-based disc osteophyte complex. Multifactorial moderate to marked spinal stenosis with moderate bilateral foraminal narrowing.  S1-2: Rudimentary  disc.  IMPRESSION: Transitional appearance of what has previously been labeled the S1 vertebral body places a rudimentary disc at the S1-2 level. Utilizing this level assignment, the present examination incorporates from the T12 through the lower sacrum.  The present examination is significantly motion degraded. The patient was not able to hold still despite sedation. Restless leg syndrome.  Mild curvature lumbar spine convex to the left.  Remote Schmorl's node deformity superior and inferior endplate of L1 with 26% loss of height centrally with minimal retropulsion posterior inferior aspect of the L1 vertebral body combined with L1-2 disk bulge/ spur causes mild spinal stenosis.  New L3 superior endplate compression fracture with 20% maximal loss of height greater on the right. No significant retropulsion. Small amount of perivertebral edema/hemorrhage suspected although incompletely assessed.  L3-4 multifactorial mild spinal stenosis and mild bilateral foraminal narrowing.  L4-5 multifactorial moderate spinal stenosis and lateral recess stenosis greater on the right. Mild to moderate foraminal narrowing greater on the right.  L5-S1 multifactorial moderate to marked spinal stenosis with moderate bilateral foraminal narrowing.   Electronically Signed   By: Chauncey Cruel M.D.   On: 07/23/2013 11:57    Medications: I have reviewed the patient's current medications.  Assessment/Plan: #1 L3 Compression fracture: stable awaiting kyphoplasty procedure. She could be ready to go to a SNF on Saturday and will ask social worker to inquire as to whether a Saturday transfer to the SNF is possible. #2 HTN: BP systolic a bit elevated and will hold off on increasing meds until back pain is improved. #3 DM2: stable on current meds.    LOS: 7 days   [name] 07/25/2013, 7:36 AM

## 2013-07-25 NOTE — Progress Notes (Signed)
Occupational Therapy Treatment Patient Details Name: [name] MRN: [id number] DOB: 04/07/1940 Today's Date: 07/25/2013    History of present illness Pt was admitted for low back pain-L1 comp fx, new L3 endplate depression-recently fell backwards and hit head   OT comments  Pt progressing with OT.  Pt really wanted a shower today and MD ordered this.  She was tearful before we started and smiling after shower.  Did not use AE this session, as pt was fatiqued afterwards  Follow Up Recommendations  SNF    Equipment Recommendations  None recommended by OT    Recommendations for Other Services      Precautions / Restrictions Precautions Precautions: Fall       Mobility Bed Mobility                  Transfers       Sit to Stand: Min assist         General transfer comment: cues for UEplacement    Balance                                   ADL Overall ADL's : Needs assistance/impaired             Lower Body Bathing: Sit to/from stand;Moderate assistance           Toilet Transfer: Minimal assistance;BSC;Ambulation             General ADL Comments: pt took seated shower; did not use AE this session.  Min A and vcs to safely step into shower stall.    pt fatiqued after shower:  did not practice with AE.  Pt completed UB ADLs with set up      Vision                     Perception     Praxis      Cognition   Behavior During Therapy: [facility] for tasks assessed/performed Overall Cognitive Status: Within Functional Limits for tasks assessed                       Extremity/Trunk Assessment               Exercises     Shoulder Instructions       General Comments      Pertinent Vitals/ Pain       Back sore; repositioned  Home Living                                          Prior Functioning/Environment              Frequency Min 2X/week     Progress Toward  Goals  OT Goals(current goals can now be found in the care plan section)  Progress towards OT goals: Progressing toward goals     Plan      Co-evaluation                 End of Session     Activity Tolerance Patient limited by fatigue   Patient Left in chair;with call bell/phone within reach;with chair alarm set;with family/visitor present   Nurse Communication          Time: 5462-7035 OT Time Calculation (min): 39 min  Charges: OT  General Charges $OT Visit: 1 Procedure OT Treatments $Self Care/Home Management : 38-52 mins  [facility] 07/25/2013, 12:17 PM Lesle Chris, OTR/L [phone] 07/25/2013

## 2013-07-25 NOTE — Progress Notes (Signed)
Physical Therapy Treatment Patient Details Name: [name] MRN: [id number] DOB: 1940/10/15 Today's Date: 07/25/2013    History of Present Illness Pt was admitted for low back pain-L1 comp fx, new L3 endplate depression-recently fell backwards and hit head    PT Comments    *Pt is progressing with mobility. She walked 125' with RW and min A today. She is scheduled for kyphoplasty at [facility] tomorrow. **  Follow Up Recommendations  SNF     Equipment Recommendations  None recommended by PT    Recommendations for Other Services OT consult     Precautions / Restrictions Precautions Precautions: Fall Restrictions Weight Bearing Restrictions: No    Mobility  Bed Mobility Overal bed mobility: Needs Assistance Bed Mobility: Sit to Supine   Sidelying to sit: Mod assist   Sit to supine: Min assist   General bed mobility comments: min A for BLEs into bed  Transfers Overall transfer level: Needs assistance Equipment used: Rolling walker (2 wheeled) Transfers: Sit to/from Stand Sit to Stand: Min assist         General transfer comment: Assist to rise, stabilize, control descent. VCs safety, technique, hand placement  Ambulation/Gait Ambulation/Gait assistance: Min assist Ambulation Distance (Feet): 125 Feet Assistive device: Rolling walker (2 wheeled) Gait Pattern/deviations: Step-through pattern Gait velocity: decr Gait velocity interpretation: Below normal speed for age/gender General Gait Details: 5/10 back pain with walking, steady with RW; min A to manage RW during turns   Stairs            Wheelchair Mobility    Modified Rankin (Stroke Patients Only)       Balance Overall balance assessment: Needs assistance;History of Falls Sitting-balance support: Feet supported Sitting balance-Leahy Scale: Good       Standing balance-Leahy Scale: Fair                      Cognition Arousal/Alertness: Awake/alert Behavior During Therapy: WFL  for tasks assessed/performed Overall Cognitive Status: Within Functional Limits for tasks assessed                      Exercises      General Comments        Pertinent Vitals/Pain *5/10 LBP at rest and with walking**    Home Living                      Prior Function            PT Goals (current goals can now be found in the care plan section) Acute Rehab PT Goals Patient Stated Goal: to be able to care for her husband who recently had leg amputation PT Goal Formulation: With patient Time For Goal Achievement: 08/02/13 Potential to Achieve Goals: Good Progress towards PT goals: Progressing toward goals    Frequency  Min 3X/week    PT Plan Current plan remains appropriate    Co-evaluation             End of Session Equipment Utilized During Treatment: Gait belt Activity Tolerance: Patient tolerated treatment well Patient left: in bed;with call bell/phone within reach     Time: 1320-1338 PT Time Calculation (min): 18 min  Charges:  $Gait Training: 8-22 mins                    G Codes:      Lucile Crater 07/25/2013, 1:43 PM [phone]

## 2013-07-25 NOTE — Progress Notes (Signed)
Patient ID: [name], female   DOB: 22-Nov-1940, 73 y.o.   MRN: [id number] Pt tent scheduled for L3 KP at [facility] on 6/5 at 1100. Details/risks of procedure d/w pt/family with their understanding and consent. [facility] will transport pt to [facility] in am and return to [facility] postprocedure. Orders placed.

## 2013-07-25 NOTE — Progress Notes (Addendum)
CSW continuing to follow.  CSW met with pt at bedside. CSW inquired with pt if pt and pt husband and pt daughter were able to discuss SNF bed offers. Pt stated that pt daughter and pt husband were at hospital earlier this morning, but pt states they didn't discuss placement. Pt shared that she would like to return to [facility] where she has been previously. Pt had periods of confusion during visit and CSW obtained permission from pt to contact pt daughter. CSW contacting pt daughter and left message. CSW to clarify with pt daughter choice for SNF.  CSW to continue to follow.  Addendum 10:42 am:  CSW received return phone call from pt daughter. Pt daughter shared that she was on her way back to the hospital and would like to meet with CSW upon her return. Pt daughter will notify CSW when she arrives and CSW plans to meet with pt and pt daughter at bedside to discuss SNF decision.  Alison Murray, MSW, [place] Work [phone]

## 2013-07-25 NOTE — Progress Notes (Signed)
CSW continuing to follow.  CSW met with pt and pt daughter at bedside to discuss SNF bed offers.  Pt expressed that she was feeling better as she was able to shower with assistance of OT today.  Pt and pt daughter agreeable to [facility] and request private room.  CSW contacted [facility] to notify facility and facility confirmed that they could accept pt and would have private room available for pt. Per [facility], if pt becomes medically stable during the weekend then facility can accept pt over the weekend.  CSW to continue to follow to provide support and assist with pt discharge planning needs.  Alison Murray, MSW, [place] Work [phone]

## 2013-07-25 NOTE — Progress Notes (Signed)
Pt was sitting up in chair when I arrived. Daughter came back from getting lunch during our visit. Pt said she felt pretty good today. 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 Pt and daughter were thankful for visit and wanted prayer. We prayer for her surgery tomorrow and she became tearful after the prayer saying everything is happening so fast. Her daughter and I encouraged her and told her that means she can get better fast. She seemed encouraged and appreciated the visit. Ernest Haber Chaplain  07/25/13 1200  Clinical Encounter Type  Visited With Patient and family together

## 2013-07-25 NOTE — Progress Notes (Signed)
Inpatient Diabetes Program Recommendations  AACE/ADA: New Consensus Statement on Inpatient Glycemic Control (2013)  Target Ranges:  Prepandial:   less than 140 mg/dL      Peak postprandial:   less than 180 mg/dL (1-2 hours)      Critically ill patients:  140 - 180 mg/dL   Reason for Visit: Hyperglycemia  Diabetes history: DM2 Outpatient Diabetes medications: metformin 500 bid and glipizide 5 bid Current orders for Inpatient glycemic control: Novolog moderate tidwc and hs, glipizide 5 mg bid  Results for NILSA, MACHT (MRN [id number]) as of 07/25/2013 13:48  Ref. Range 07/24/2013 12:30 07/24/2013 21:59 07/25/2013 07:51 07/25/2013 12:17  Glucose-Capillary Latest Range: 70-99 mg/dL 287 (H) 270 (H) 198 (H) 263 (H)   Continues with hyperglycemia. Would likely benefit from addition of meal coverage insulin.  Recommendations: Please consider adding Novolog 3 units tidwc for meal coverage insulin if pt eats >50% meal.  Will continue to follow. Thank you. Lorenda Peck, RD, LDN, CDE Inpatient Diabetes Coordinator [phone]

## 2013-07-26 ENCOUNTER — Ambulatory Visit (HOSPITAL_COMMUNITY)
Admit: 2013-07-26 | Discharge: 2013-07-26 | Disposition: A | Discharge status: Home / Self Care | Payer: Medicare Other | Attending: Internal Medicine | Admitting: Internal Medicine

## 2013-07-26 DIAGNOSIS — M545 Low back pain, unspecified: Secondary | ICD-10-CM | POA: Diagnosis not present

## 2013-07-26 DIAGNOSIS — I10 Essential (primary) hypertension: Secondary | ICD-10-CM | POA: Diagnosis not present

## 2013-07-26 DIAGNOSIS — S32009A Unspecified fracture of unspecified lumbar vertebra, initial encounter for closed fracture: Secondary | ICD-10-CM | POA: Diagnosis not present

## 2013-07-26 DIAGNOSIS — E1139 Type 2 diabetes mellitus with other diabetic ophthalmic complication: Secondary | ICD-10-CM | POA: Diagnosis not present

## 2013-07-26 LAB — CBC
HCT: 36.3 % (ref 36.0–46.0)
Hemoglobin: 12.1 g/dL (ref 12.0–15.0)
MCH: 26.8 pg (ref 26.0–34.0)
MCHC: 33.3 g/dL (ref 30.0–36.0)
MCV: 80.5 fL (ref 78.0–100.0)
PLATELETS: 242 10*3/uL (ref 150–400)
RBC: 4.51 MIL/uL (ref 3.87–5.11)
RDW: 14.1 % (ref 11.5–15.5)
WBC: 5.7 10*3/uL (ref 4.0–10.5)

## 2013-07-26 LAB — URINALYSIS, ROUTINE W REFLEX MICROSCOPIC
Bilirubin Urine: NEGATIVE
Glucose, UA: 250 mg/dL — AB
Hgb urine dipstick: NEGATIVE
Ketones, ur: NEGATIVE mg/dL
Leukocytes, UA: NEGATIVE
NITRITE: NEGATIVE
Protein, ur: 100 mg/dL — AB
Specific Gravity, Urine: 1.011 (ref 1.005–1.030)
UROBILINOGEN UA: 0.2 mg/dL (ref 0.0–1.0)
pH: 7.5 (ref 5.0–8.0)

## 2013-07-26 LAB — BASIC METABOLIC PANEL
BUN: 18 mg/dL (ref 6–23)
CO2: 27 mEq/L (ref 19–32)
CREATININE: 0.75 mg/dL (ref 0.50–1.10)
Calcium: 9.6 mg/dL (ref 8.4–10.5)
Chloride: 104 mEq/L (ref 96–112)
GFR calc Af Amer: >90 mL/min (ref >90)
GFR calc non Af Amer: 81 mL/min — ABNORMAL LOW (ref >90)
Glucose, Bld: 148 mg/dL — ABNORMAL HIGH (ref 70–99)
Potassium: 3.5 mEq/L — ABNORMAL LOW (ref 3.7–5.3)
Sodium: 144 mEq/L (ref 137–147)

## 2013-07-26 LAB — GLUCOSE, CAPILLARY
GLUCOSE-CAPILLARY: 215 mg/dL — AB (ref 70–99)
Glucose-Capillary: 181 mg/dL — ABNORMAL HIGH (ref 70–99)

## 2013-07-26 LAB — URINE MICROSCOPIC-ADD ON

## 2013-07-26 MED ORDER — VERAPAMIL HCL 80 MG PO TABS
80.0000 mg | ORAL_TABLET | ORAL | Status: DC
  Filled 2013-07-26: qty 1

## 2013-07-26 MED ORDER — HYDRALAZINE HCL 20 MG/ML IJ SOLN
10.0000 mg | Freq: Once | INTRAMUSCULAR | Status: AC
  Filled 2013-07-26: qty 0.5

## 2013-07-26 MED ORDER — INSULIN ASPART 100 UNIT/ML ~~LOC~~ SOLN: 0.0000 [IU] | Freq: Three times a day (TID) | SUBCUTANEOUS | Status: DC

## 2013-07-26 MED ORDER — METOPROLOL TARTRATE 25 MG PO TABS
25.0000 mg | ORAL_TABLET | Freq: Two times a day (BID) | ORAL | Status: DC
  Administered 2013-07-26 – 2013-07-27 (×3): 25 mg via ORAL
  Filled 2013-07-26 (×4): qty 1

## 2013-07-26 MED ORDER — FENTANYL CITRATE 0.05 MG/ML IJ SOLN
INTRAMUSCULAR | Status: AC | PRN
  Administered 2013-07-26: 12.5 ug via INTRAVENOUS
  Administered 2013-07-26 (×4): 25 ug via INTRAVENOUS

## 2013-07-26 MED ORDER — METOPROLOL TARTRATE 25 MG/10 ML ORAL SUSPENSION: 25.0000 mg | Freq: Once | ORAL | Status: DC

## 2013-07-26 MED ORDER — POLYETHYLENE GLYCOL 3350 17 G PO PACK: 17.0000 g | PACK | Freq: Every day | ORAL | Status: DC

## 2013-07-26 MED ORDER — VERAPAMIL HCL ER 240 MG PO TBCR: 180.0000 mg | EXTENDED_RELEASE_TABLET | Freq: Every day | ORAL | Status: DC

## 2013-07-26 MED ORDER — MIDAZOLAM HCL 2 MG/2ML IJ SOLN
INTRAMUSCULAR | Status: AC
  Filled 2013-07-26: qty 4

## 2013-07-26 MED ORDER — INSULIN ASPART 100 UNIT/ML ~~LOC~~ SOLN: 0.0000 [IU] | Freq: Every day | SUBCUTANEOUS | Status: DC

## 2013-07-26 MED ORDER — METOPROLOL TARTRATE 50 MG PO TABS: 25.0000 mg | ORAL_TABLET | ORAL | Status: DC

## 2013-07-26 MED ORDER — ACETAMINOPHEN 500 MG PO TABS: 500.0000 mg | ORAL_TABLET | Freq: Four times a day (QID) | ORAL | Status: DC | PRN

## 2013-07-26 MED ORDER — HYDROMORPHONE HCL PF 1 MG/ML IJ SOLN
INTRAMUSCULAR | Status: AC
  Filled 2013-07-26: qty 1

## 2013-07-26 MED ORDER — FENTANYL CITRATE 0.05 MG/ML IJ SOLN
INTRAMUSCULAR | Status: AC
  Filled 2013-07-26: qty 4

## 2013-07-26 MED ORDER — HYDRALAZINE HCL 20 MG/ML IJ SOLN
INTRAMUSCULAR | Status: AC | PRN
  Administered 2013-07-26: 5 mg via INTRAVENOUS

## 2013-07-26 MED ORDER — SODIUM CHLORIDE 0.9 % IV SOLN
INTRAVENOUS | Status: AC | PRN
  Administered 2013-07-26: 50 mL/h via INTRAVENOUS

## 2013-07-26 MED ORDER — MAGNESIUM HYDROXIDE 400 MG/5ML PO SUSP: 30.0000 mL | Freq: Every day | ORAL | Status: DC | PRN

## 2013-07-26 MED ORDER — TRAMADOL HCL 50 MG PO TABS: 50.0000 mg | ORAL_TABLET | Freq: Four times a day (QID) | ORAL | Status: DC | PRN

## 2013-07-26 MED ORDER — SODIUM CHLORIDE 0.9 % IV SOLN: INTRAVENOUS | Status: AC

## 2013-07-26 MED ORDER — CEFAZOLIN SODIUM-DEXTROSE 2-3 GM-% IV SOLR
2.0000 g | Freq: Once | INTRAVENOUS | Status: AC
  Administered 2013-07-26: 2 g via INTRAVENOUS

## 2013-07-26 MED ORDER — TOBRAMYCIN SULFATE 1.2 G IJ SOLR
INTRAMUSCULAR | Status: AC
  Filled 2013-07-26: qty 1.2

## 2013-07-26 MED ORDER — SENNOSIDES-DOCUSATE SODIUM 8.6-50 MG PO TABS: 1.0000 | ORAL_TABLET | Freq: Every evening | ORAL | Status: DC | PRN

## 2013-07-26 MED ORDER — MIDAZOLAM HCL 2 MG/2ML IJ SOLN
INTRAMUSCULAR | Status: AC | PRN
  Administered 2013-07-26: 1 mg via INTRAVENOUS
  Administered 2013-07-26: 0.5 mg via INTRAVENOUS
  Administered 2013-07-26: 1 mg via INTRAVENOUS
  Administered 2013-07-26: 0.5 mg via INTRAVENOUS

## 2013-07-26 NOTE — Progress Notes (Addendum)
Subjective: Having some back pain with standing, no constipation, some early evening confusion.  Objective: Vital signs in last 24 hours: Temp:  [98.4 F (36.9 C)-98.7 F (37.1 C)] 98.4 F (36.9 C) (06/05 0555) Pulse Rate:  [67-80] 67 (06/05 0555) Resp:  [18-20] 20 (06/05 0555) BP: (157-168)/(81-91) 168/91 mmHg (06/05 0555) SpO2:  [93 %-97 %] 93 % (06/05 0555) Weight change:    Intake/Output from previous day: 06/04 0701 - 06/05 0700 In: 480 [P.O.:480] Out: 700 [Urine:700]   General appearance: alert, cooperative and no distress Resp: clear to auscultation bilaterally Cardio: regular rate and rhythm GI: soft, non-tender; bowel sounds normal; no masses,  no organomegaly Extremities: extremities normal, atraumatic, no cyanosis or edema  Lab Results:  Recent Labs  07/24/13 0321 07/26/13 0324  WBC 5.5 5.7  HGB 11.6* 12.1  HCT 35.0* 36.3  PLT 212 242   BMET  Recent Labs  07/25/13 0337 07/26/13 0324  NA  --  144  K  --  3.5*  CL  --  104  CO2  --  27  GLUCOSE  --  148*  BUN  --  18  CREATININE 0.68 0.75  CALCIUM  --  9.6   CMET CMP     Component Value Date/Time   NA 144 07/26/2013 0324   K 3.5* 07/26/2013 0324   CL 104 07/26/2013 0324   CO2 27 07/26/2013 0324   GLUCOSE 148* 07/26/2013 0324   BUN 18 07/26/2013 0324   CREATININE 0.75 07/26/2013 0324   CALCIUM 9.6 07/26/2013 0324   PROT 7.4 07/22/2013 0406   ALBUMIN 3.8 07/22/2013 0406   AST 15 07/22/2013 0406   ALT 12 07/22/2013 0406   ALKPHOS 66 07/22/2013 0406   BILITOT 0.8 07/22/2013 0406   GFRNONAA 81* 07/26/2013 0324   GFRAA >90 07/26/2013 0324    CBG (last 3)   Recent Labs  07/25/13 0751 07/25/13 1217 07/25/13 1810  GLUCAP 198* 263* 204*    INR RESULTS:   Lab Results  Component Value Date   INR 1.01 07/24/2013   INR 1.01 10/07/2010     Studies/Results: No results found.  Medications: I have reviewed the patient's current medications.  Assessment/Plan: #1 L3 Compression fracture: for augmentation  procedure today, then PT evaluation, likely discharge to [facility]. #2 HTN: BP consistently a bit too high, so will increase dose of metoprolol #3 DM2: stable on current meds. 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

## 2013-07-26 NOTE — Discharge Instructions (Signed)
No stooping ,bending or lifting more than 10 lbs for 2 weeks.Use walker to ambulate for 2 weeks. RTC in 2 weeks.

## 2013-07-26 NOTE — Sedation Documentation (Signed)
Report given to [facility] Malka So and Mea.

## 2013-07-26 NOTE — Sedation Documentation (Signed)
Dr. Keturah Barre at bedside s/w pt and daughter. Questions answered. Pt to return to [facility] once [facility] arrives

## 2013-07-26 NOTE — Progress Notes (Signed)
UR COMPLETED  

## 2013-07-26 NOTE — Procedures (Signed)
S/P L3 KP with biopsy 

## 2013-07-26 NOTE — Progress Notes (Signed)
Pt was lying in bed when I arrived. She said she was feeling nauseous.  I asked if she'd like me to inform her nurse and she said she had given her something. Visit was brief when she confirmed she had surgery today. Pt thanked me for stopping by. Ernest Haber Chaplain  07/26/13 1400  Clinical Encounter Type  Visited With Patient

## 2013-07-26 NOTE — Discharge Summary (Addendum)
Physician Discharge Summary  Patient ID: [name] MRN: [id number] DOB/AGE: [birth year] 75 y.o.  Admit date: 07/18/2013 Discharge date: 07/27/2013   Discharge Diagnoses:  Principal Problem:   Low back pain Active Problems:   HYPERTENSION   DIABETES MELLITUS, TYPE II, HX OF   Gait instability   Nausea with vomiting   Discharged Condition: good  Hospital Course: The patient is a 73 year old woman with several medical problems who had a fall last Sunday despite using her walker when she fell backwards and hit the back side of her head as well as her low back. She did not have a laceration or loss of consciousness. She has had gradually increasing low back pain since the fall and now is unable to stand and walk because of the intense low back pain. The pain does not radiate down her legs. In addition, for the past 2-3 months she's had intermittent nausea and vomiting. She has not had change in her vision, and is currently not having a headache or nausea. She is also not had fever, chills, shortness of breath, abdominal pain, constipation, diarrhea, dysuria, or frequency. She is being admitted to sort out the reason for her low back pain and gait instability.  She had several procedures in the hospital.  She had an upper GI series and small bowel follow-through there was a totally normal study with no evidence of gastroesophageal reflux or obstruction.  She also had lumbar spine x-rays as well as a lumbar spine MRI exam that was significant for evidence of an acute L3 level compression fracture.  She went on to have a L3 level kyphoplasty on July 26, 2013 which she tolerated well without immediate complications.  She also had a GI consultation done for continued intermittent episodes of regurgitation of food of unclear etiology.  Reglan was added to her regimen and appear to help somewhat with regurgitation.  She remains somewhat weak, although her low back pain is improved, and she will be  transferring from the hospital to a skilled nursing facility for gait rehabilitation.  Consults: GI and Interventional Radiology  Significant Diagnostic Studies:  No results found.  Labs: Lab Results  Component Value Date   WBC 5.7 07/26/2013   HGB 12.1 07/26/2013   HCT 36.3 07/26/2013   MCV 80.5 07/26/2013   PLT 242 07/26/2013     Recent Labs Lab 07/22/13 0406  07/26/13 0324  NA 142  --  144  K 3.4*  --  3.5*  CL 102  --  104  CO2 25  --  27  BUN 11  --  18  CREATININE 0.81  < > 0.75  CALCIUM 9.7  --  9.6  PROT 7.4  --   --   BILITOT 0.8  --   --   ALKPHOS 66  --   --   ALT 12  --   --   AST 15  --   --   GLUCOSE 227*  --  148*  < > = values in this interval not displayed.     Lab Results  Component Value Date   INR 1.01 07/24/2013   INR 1.01 10/07/2010     Recent Results (from the past 240 hour(s))  SURGICAL PCR SCREEN     Status: None   Collection Time    07/25/13  9:12 PM      Result Value Ref Range Status   MRSA, PCR NEGATIVE  NEGATIVE Final   Staphylococcus aureus NEGATIVE  NEGATIVE  Final   Comment:            The Xpert SA Assay (FDA     approved for NASAL specimens     in patients over 91 years of age),     is one component of     a comprehensive surveillance     program.  Test performance has     been validated by [employer] for patients greater     than or equal to 75 year old.     It is not intended     to diagnose infection nor to     guide or monitor treatment.      Discharge Exam: Blood pressure 137/83, pulse 62, temperature 98.5 F (36.9 C), temperature source Oral, resp. rate 15, height 5\' 7"  (1.702 m), weight 218 lb 0.6 oz (98.9 kg), SpO2 98.00%.  Physical Exam:  Gen: AAF in NAD HEENT: Anicteric, MMM, no JVD Cards: RRR, no MRG  Lungs: CTAB, no wheezes Abd: no masses, NT, ND Ext: 1+ B/L Pitting edema   Disposition:  she'll be discharged from the hospital and go to a skilled nursing facility for rehabilitation.  After she completes  rehabilitation of the skilled nursing facility she should call [facility] at [phone] to schedule follow-up appointment with her primary care physician, [name].      Discharge Instructions   Diet - low sodium heart healthy    Complete by:  As directed      Discharge instructions    Complete by:  As directed   Use a walker or cane to safely walk, as directed by your physical therapy and occupational therapy staff members.  He will go to a skilled nursing facility to improve the safety of your gait.     Increase activity slowly    Complete by:  As directed             Medication List         acetaminophen 500 MG tablet  Commonly known as:  TYLENOL  Take 1 tablet (500 mg total) by mouth every 6 (six) hours as needed.     ALPRAZolam 0.5 MG tablet  Commonly known as:  XANAX  Take 0.5 mg by mouth 2 (two) times daily as needed for anxiety.     aspirin EC 81 MG tablet  Take 81 mg by mouth daily.     B-COMPLEX/B-12 PO  Take by mouth daily. Once a week.     glipiZIDE 5 MG 24 hr tablet  Commonly known as:  GLUCOTROL XL  Take 5 mg by mouth 2 (two) times daily.     HYDROcodone-acetaminophen 5-325 MG per tablet  Commonly known as:  NORCO/VICODIN  Take 1 tablet by mouth every 6 (six) hours as needed for moderate pain (pain).     insulin aspart 100 UNIT/ML injection  Commonly known as:  novoLOG  Inject 0-15 Units into the skin 3 (three) times daily with meals.     insulin aspart 100 UNIT/ML injection  Commonly known as:  novoLOG  Inject 0-5 Units into the skin at bedtime.     losartan 100 MG tablet  Commonly known as:  COZAAR  Take 100 mg by mouth daily.     magnesium hydroxide 400 MG/5ML suspension  Commonly known as:  MILK OF MAGNESIA  Take 30 mLs by mouth daily as needed for mild constipation or moderate constipation.     metFORMIN 500 MG tablet  Commonly known as:  GLUCOPHAGE  Take 500 mg by mouth 2 (two) times daily with a meal.      metoprolol succinate 100 MG 24 hr tablet  Commonly known as:  TOPROL-XL  Take 100 mg by mouth daily.     polyethylene glycol packet  Commonly known as:  MIRALAX / GLYCOLAX  Take 17 g by mouth daily.     potassium chloride SA 20 MEQ tablet  Commonly known as:  K-DUR,KLOR-CON  Take 20 mEq by mouth daily.     pramipexole 0.25 MG tablet  Commonly known as:  MIRAPEX  Take 0.25 mg by mouth 2 (two) times daily.     senna-docusate 8.6-50 MG per tablet  Commonly known as:  Senokot-S  Take 1 tablet by mouth at bedtime as needed for mild constipation.     traMADol 50 MG tablet  Commonly known as:  ULTRAM  Take 1 tablet (50 mg total) by mouth every 6 (six) hours as needed for moderate pain or severe pain.     verapamil 240 MG CR tablet  Commonly known as:  CALAN-SR  Take 1 tablet (240 mg total) by mouth daily.         SignedVelna Hatchet 07/27/2013, 9:17 AM

## 2013-07-26 NOTE — Progress Notes (Addendum)
CSW continuing to follow for disposition planning.  CSW attempted to meet with pt at bedside to provide support for upcoming procedure, but pt had already left for procedure.   CSW contacted [facility] to confirm that pt has a private room available on Saturday if pt medically ready for discharge at that time.  CSW to continue to follow and facilitate pt discharge needs when pt medically ready for discharge.  Addendum 2:30 pm:  CSW briefly visited pt upon pt return from procedure.  Pt resting quietly during this time. Pt reports that the procedure went well. Support provided.  CSW notified pt that [facility] had confirmed private room at [facility] when pt medically ready for discharge.  CSW to continue to follow and facilitate pt discharge needs when pt medically ready for discharge.  Alison Murray, MSW, [place] [phone]

## 2013-07-26 NOTE — Sedation Documentation (Signed)
Pt in nursing station resting w/ dtr at bedside

## 2013-07-27 DIAGNOSIS — Z09 Encounter for follow-up examination after completed treatment for conditions other than malignant neoplasm: Secondary | ICD-10-CM | POA: Diagnosis not present

## 2013-07-27 DIAGNOSIS — K922 Gastrointestinal hemorrhage, unspecified: Secondary | ICD-10-CM | POA: Diagnosis not present

## 2013-07-27 DIAGNOSIS — G4700 Insomnia, unspecified: Secondary | ICD-10-CM | POA: Diagnosis not present

## 2013-07-27 DIAGNOSIS — F411 Generalized anxiety disorder: Secondary | ICD-10-CM | POA: Diagnosis not present

## 2013-07-27 DIAGNOSIS — R269 Unspecified abnormalities of gait and mobility: Secondary | ICD-10-CM | POA: Diagnosis not present

## 2013-07-27 DIAGNOSIS — E538 Deficiency of other specified B group vitamins: Secondary | ICD-10-CM | POA: Diagnosis not present

## 2013-07-27 DIAGNOSIS — M48061 Spinal stenosis, lumbar region without neurogenic claudication: Secondary | ICD-10-CM | POA: Diagnosis not present

## 2013-07-27 DIAGNOSIS — E7800 Pure hypercholesterolemia, unspecified: Secondary | ICD-10-CM | POA: Diagnosis not present

## 2013-07-27 DIAGNOSIS — G43909 Migraine, unspecified, not intractable, without status migrainosus: Secondary | ICD-10-CM | POA: Diagnosis not present

## 2013-07-27 DIAGNOSIS — I509 Heart failure, unspecified: Secondary | ICD-10-CM | POA: Diagnosis not present

## 2013-07-27 DIAGNOSIS — I10 Essential (primary) hypertension: Secondary | ICD-10-CM | POA: Diagnosis not present

## 2013-07-27 DIAGNOSIS — IMO0002 Reserved for concepts with insufficient information to code with codable children: Secondary | ICD-10-CM | POA: Diagnosis not present

## 2013-07-27 DIAGNOSIS — E876 Hypokalemia: Secondary | ICD-10-CM | POA: Diagnosis not present

## 2013-07-27 DIAGNOSIS — K5900 Constipation, unspecified: Secondary | ICD-10-CM | POA: Diagnosis not present

## 2013-07-27 DIAGNOSIS — M549 Dorsalgia, unspecified: Secondary | ICD-10-CM | POA: Diagnosis not present

## 2013-07-27 DIAGNOSIS — Z9181 History of falling: Secondary | ICD-10-CM | POA: Diagnosis not present

## 2013-07-27 DIAGNOSIS — F329 Major depressive disorder, single episode, unspecified: Secondary | ICD-10-CM | POA: Diagnosis not present

## 2013-07-27 DIAGNOSIS — E785 Hyperlipidemia, unspecified: Secondary | ICD-10-CM | POA: Diagnosis not present

## 2013-07-27 DIAGNOSIS — E559 Vitamin D deficiency, unspecified: Secondary | ICD-10-CM | POA: Diagnosis not present

## 2013-07-27 DIAGNOSIS — F3289 Other specified depressive episodes: Secondary | ICD-10-CM | POA: Diagnosis not present

## 2013-07-27 DIAGNOSIS — E878 Other disorders of electrolyte and fluid balance, not elsewhere classified: Secondary | ICD-10-CM | POA: Diagnosis not present

## 2013-07-27 DIAGNOSIS — G8911 Acute pain due to trauma: Secondary | ICD-10-CM | POA: Diagnosis not present

## 2013-07-27 DIAGNOSIS — S0990XA Unspecified injury of head, initial encounter: Secondary | ICD-10-CM | POA: Diagnosis not present

## 2013-07-27 DIAGNOSIS — G2581 Restless legs syndrome: Secondary | ICD-10-CM | POA: Diagnosis not present

## 2013-07-27 DIAGNOSIS — R110 Nausea: Secondary | ICD-10-CM | POA: Diagnosis not present

## 2013-07-27 DIAGNOSIS — Z9889 Other specified postprocedural states: Secondary | ICD-10-CM | POA: Diagnosis not present

## 2013-07-27 DIAGNOSIS — E119 Type 2 diabetes mellitus without complications: Secondary | ICD-10-CM | POA: Diagnosis not present

## 2013-07-27 DIAGNOSIS — E860 Dehydration: Secondary | ICD-10-CM | POA: Diagnosis not present

## 2013-07-27 DIAGNOSIS — M6281 Muscle weakness (generalized): Secondary | ICD-10-CM | POA: Diagnosis not present

## 2013-07-27 DIAGNOSIS — R112 Nausea with vomiting, unspecified: Secondary | ICD-10-CM | POA: Diagnosis not present

## 2013-07-27 DIAGNOSIS — R262 Difficulty in walking, not elsewhere classified: Secondary | ICD-10-CM | POA: Diagnosis not present

## 2013-07-27 DIAGNOSIS — M5090 Cervical disc disorder, unspecified, unspecified cervical region: Secondary | ICD-10-CM | POA: Diagnosis not present

## 2013-07-27 DIAGNOSIS — E1139 Type 2 diabetes mellitus with other diabetic ophthalmic complication: Secondary | ICD-10-CM | POA: Diagnosis not present

## 2013-07-27 DIAGNOSIS — R489 Unspecified symbolic dysfunctions: Secondary | ICD-10-CM | POA: Diagnosis not present

## 2013-07-27 DIAGNOSIS — M545 Low back pain, unspecified: Secondary | ICD-10-CM | POA: Diagnosis not present

## 2013-07-27 NOTE — Progress Notes (Signed)
Pt. Has been discharged to [facility].  Report was called to staff at facility.  Pt. Is to be transported to facility by ambulance which has been arranged by SW.

## 2013-07-27 NOTE — Progress Notes (Signed)
Per MD, Pt ready for d/c.  Notified RN, Pt, family and facility.  Sent d/c summary via [facility].  Facility ready to receive Pt.  Arranged for transportation.  Pt to be d/c'd.  Bernita Raisin, [facility] [employer] [phone]

## 2013-07-27 NOTE — Progress Notes (Signed)
Physical Therapy Treatment Patient Details Name: [name] MRN: [id number] DOB: [birth year] Today's Date: 07/27/2013    History of Present Illness Pt was admitted for low back pain-L1 comp fx, new L3 endplate depression-recently fell backwards and hit head. s/p KP 07/26/13.     PT Comments    **Pt notes decreased back pain since KP yesterday. She walked 130' with RW and min A. Mild confusion noted (pt asked if there was a lighthouse outside while looking out window). *  Follow Up Recommendations  SNF     Equipment Recommendations  None recommended by PT    Recommendations for Other Services OT consult     Precautions / Restrictions Precautions Precautions: Fall Restrictions Weight Bearing Restrictions: No    Mobility  Bed Mobility Overal bed mobility: Needs Assistance Bed Mobility: Supine to Sit;Rolling Rolling: Min assist Sidelying to sit: Mod assist       General bed mobility comments: verbal and manual cues for technique, assist to raise trunk  Transfers Overall transfer level: Needs assistance Equipment used: Rolling walker (2 wheeled) Transfers: Sit to/from Stand Sit to Stand: Min assist         General transfer comment: Assist to rise, stabilize, control descent. VCs safety, technique, hand placement  Ambulation/Gait   Ambulation Distance (Feet): 130 Feet Assistive device: Rolling walker (2 wheeled) Gait Pattern/deviations: Step-through pattern Gait velocity: decr   General Gait Details: min A to maneuver RW, pt denied pain with walking, increased time for all mobility   Stairs            Wheelchair Mobility    Modified Rankin (Stroke Patients Only)       Balance     Sitting balance-Leahy Scale: Good       Standing balance-Leahy Scale: Fair                      Cognition Arousal/Alertness: Awake/alert Behavior During Therapy: WFL for tasks assessed/performed Overall Cognitive Status: Within Functional Limits for  tasks assessed (mild confusion noted, pt asked if there was a lighthouse outside while looking out window)                      Exercises      General Comments        Pertinent Vitals/Pain **0/10 pain*    Home Living                      Prior Function            PT Goals (current goals can now be found in the care plan section) Acute Rehab PT Goals Patient Stated Goal: to be able to care for her husband who recently had leg amputation PT Goal Formulation: With patient Time For Goal Achievement: 08/02/13 Potential to Achieve Goals: Good Progress towards PT goals: Progressing toward goals    Frequency  Min 3X/week    PT Plan Current plan remains appropriate    Co-evaluation             End of Session Equipment Utilized During Treatment: Gait belt Activity Tolerance: Patient tolerated treatment well Patient left: with call bell/phone within reach;in chair;with chair alarm set     Time: 3762-8315 PT Time Calculation (min): 35 min  Charges:  $Gait Training: 8-22 mins $Therapeutic Activity: 8-22 mins                    G Codes:  Lucile Crater 07/27/2013, 8:22 AM [phone]

## 2013-07-28 LAB — URINE CULTURE
Colony Count: >=100000
Special Requests: NORMAL

## 2013-07-29 ENCOUNTER — Other Ambulatory Visit (HOSPITAL_COMMUNITY): Payer: Self-pay | Admitting: Interventional Radiology

## 2013-07-29 DIAGNOSIS — M549 Dorsalgia, unspecified: Secondary | ICD-10-CM

## 2013-07-29 DIAGNOSIS — IMO0002 Reserved for concepts with insufficient information to code with codable children: Secondary | ICD-10-CM

## 2013-07-29 LAB — GLUCOSE, CAPILLARY
GLUCOSE-CAPILLARY: 212 mg/dL — AB (ref 70–99)
GLUCOSE-CAPILLARY: 218 mg/dL — AB (ref 70–99)
GLUCOSE-CAPILLARY: 163 mg/dL — AB (ref 70–99)

## 2013-07-30 DIAGNOSIS — R112 Nausea with vomiting, unspecified: Secondary | ICD-10-CM | POA: Diagnosis not present

## 2013-07-30 DIAGNOSIS — K922 Gastrointestinal hemorrhage, unspecified: Secondary | ICD-10-CM | POA: Diagnosis not present

## 2013-08-01 DIAGNOSIS — E119 Type 2 diabetes mellitus without complications: Secondary | ICD-10-CM | POA: Diagnosis not present

## 2013-08-01 DIAGNOSIS — E7800 Pure hypercholesterolemia, unspecified: Secondary | ICD-10-CM | POA: Diagnosis not present

## 2013-08-01 DIAGNOSIS — M549 Dorsalgia, unspecified: Secondary | ICD-10-CM | POA: Diagnosis not present

## 2013-08-01 DIAGNOSIS — I10 Essential (primary) hypertension: Secondary | ICD-10-CM | POA: Diagnosis not present

## 2013-08-05 DIAGNOSIS — G2581 Restless legs syndrome: Secondary | ICD-10-CM | POA: Diagnosis not present

## 2013-08-07 ENCOUNTER — Ambulatory Visit (HOSPITAL_COMMUNITY)
Admission: RE | Admit: 2013-08-07 | Discharge: 2013-08-07 | Disposition: A | Discharge status: Home / Self Care | Payer: Medicare Other | Source: Ambulatory Visit | Attending: Interventional Radiology | Admitting: Interventional Radiology

## 2013-08-07 DIAGNOSIS — Z9889 Other specified postprocedural states: Secondary | ICD-10-CM | POA: Diagnosis not present

## 2013-08-07 DIAGNOSIS — Z09 Encounter for follow-up examination after completed treatment for conditions other than malignant neoplasm: Secondary | ICD-10-CM | POA: Diagnosis not present

## 2013-08-07 DIAGNOSIS — IMO0002 Reserved for concepts with insufficient information to code with codable children: Secondary | ICD-10-CM

## 2013-08-07 DIAGNOSIS — M549 Dorsalgia, unspecified: Secondary | ICD-10-CM

## 2013-08-13 DIAGNOSIS — G2581 Restless legs syndrome: Secondary | ICD-10-CM | POA: Diagnosis not present

## 2013-08-13 DIAGNOSIS — G4700 Insomnia, unspecified: Secondary | ICD-10-CM | POA: Diagnosis not present

## 2013-08-21 DIAGNOSIS — E119 Type 2 diabetes mellitus without complications: Secondary | ICD-10-CM | POA: Diagnosis not present

## 2013-08-21 DIAGNOSIS — M549 Dorsalgia, unspecified: Secondary | ICD-10-CM | POA: Diagnosis not present

## 2013-08-21 DIAGNOSIS — F411 Generalized anxiety disorder: Secondary | ICD-10-CM | POA: Diagnosis not present

## 2013-08-21 DIAGNOSIS — I10 Essential (primary) hypertension: Secondary | ICD-10-CM | POA: Diagnosis not present

## 2013-08-21 DIAGNOSIS — G2581 Restless legs syndrome: Secondary | ICD-10-CM | POA: Diagnosis not present

## 2013-08-21 DIAGNOSIS — F329 Major depressive disorder, single episode, unspecified: Secondary | ICD-10-CM | POA: Diagnosis not present

## 2013-08-21 DIAGNOSIS — F3289 Other specified depressive episodes: Secondary | ICD-10-CM | POA: Diagnosis not present

## 2013-08-27 DIAGNOSIS — I10 Essential (primary) hypertension: Secondary | ICD-10-CM | POA: Diagnosis not present

## 2013-08-27 DIAGNOSIS — E785 Hyperlipidemia, unspecified: Secondary | ICD-10-CM | POA: Diagnosis not present

## 2013-08-27 DIAGNOSIS — G2581 Restless legs syndrome: Secondary | ICD-10-CM | POA: Diagnosis not present

## 2013-08-27 DIAGNOSIS — E119 Type 2 diabetes mellitus without complications: Secondary | ICD-10-CM | POA: Diagnosis not present

## 2013-08-30 DIAGNOSIS — E119 Type 2 diabetes mellitus without complications: Secondary | ICD-10-CM | POA: Diagnosis not present

## 2013-08-30 DIAGNOSIS — M48061 Spinal stenosis, lumbar region without neurogenic claudication: Secondary | ICD-10-CM | POA: Diagnosis not present

## 2013-08-30 DIAGNOSIS — Z9181 History of falling: Secondary | ICD-10-CM | POA: Diagnosis not present

## 2013-08-30 DIAGNOSIS — I509 Heart failure, unspecified: Secondary | ICD-10-CM | POA: Diagnosis not present

## 2013-08-30 DIAGNOSIS — F3289 Other specified depressive episodes: Secondary | ICD-10-CM | POA: Diagnosis not present

## 2013-08-30 DIAGNOSIS — I10 Essential (primary) hypertension: Secondary | ICD-10-CM | POA: Diagnosis not present

## 2013-08-30 DIAGNOSIS — F329 Major depressive disorder, single episode, unspecified: Secondary | ICD-10-CM | POA: Diagnosis not present

## 2013-08-30 DIAGNOSIS — M8448XD Pathological fracture, other site, subsequent encounter for fracture with routine healing: Secondary | ICD-10-CM | POA: Diagnosis not present

## 2013-09-02 DIAGNOSIS — F3289 Other specified depressive episodes: Secondary | ICD-10-CM | POA: Diagnosis not present

## 2013-09-02 DIAGNOSIS — M8448XD Pathological fracture, other site, subsequent encounter for fracture with routine healing: Secondary | ICD-10-CM | POA: Diagnosis not present

## 2013-09-02 DIAGNOSIS — F329 Major depressive disorder, single episode, unspecified: Secondary | ICD-10-CM | POA: Diagnosis not present

## 2013-09-02 DIAGNOSIS — I509 Heart failure, unspecified: Secondary | ICD-10-CM | POA: Diagnosis not present

## 2013-09-02 DIAGNOSIS — E119 Type 2 diabetes mellitus without complications: Secondary | ICD-10-CM | POA: Diagnosis not present

## 2013-09-02 DIAGNOSIS — M48061 Spinal stenosis, lumbar region without neurogenic claudication: Secondary | ICD-10-CM | POA: Diagnosis not present

## 2013-09-02 DIAGNOSIS — I10 Essential (primary) hypertension: Secondary | ICD-10-CM | POA: Diagnosis not present

## 2013-09-04 ENCOUNTER — Encounter (HOSPITAL_COMMUNITY): Payer: Self-pay | Admitting: Emergency Medicine

## 2013-09-04 ENCOUNTER — Emergency Department (HOSPITAL_COMMUNITY)
Admission: EM | Admit: 2013-09-04 | Discharge: 2013-09-04 | Disposition: A | Discharge status: Home / Self Care | Payer: Medicare Other | Attending: Emergency Medicine | Admitting: Emergency Medicine

## 2013-09-04 ENCOUNTER — Emergency Department (HOSPITAL_COMMUNITY): Payer: Medicare Other

## 2013-09-04 DIAGNOSIS — M1990 Unspecified osteoarthritis, unspecified site: Secondary | ICD-10-CM | POA: Insufficient documentation

## 2013-09-04 DIAGNOSIS — N390 Urinary tract infection, site not specified: Secondary | ICD-10-CM | POA: Insufficient documentation

## 2013-09-04 DIAGNOSIS — M6281 Muscle weakness (generalized): Secondary | ICD-10-CM | POA: Diagnosis not present

## 2013-09-04 DIAGNOSIS — R5383 Other fatigue: Principal | ICD-10-CM

## 2013-09-04 DIAGNOSIS — G2581 Restless legs syndrome: Secondary | ICD-10-CM | POA: Insufficient documentation

## 2013-09-04 DIAGNOSIS — F329 Major depressive disorder, single episode, unspecified: Secondary | ICD-10-CM | POA: Insufficient documentation

## 2013-09-04 DIAGNOSIS — Z79899 Other long term (current) drug therapy: Secondary | ICD-10-CM | POA: Diagnosis not present

## 2013-09-04 DIAGNOSIS — E119 Type 2 diabetes mellitus without complications: Secondary | ICD-10-CM | POA: Insufficient documentation

## 2013-09-04 DIAGNOSIS — I10 Essential (primary) hypertension: Secondary | ICD-10-CM | POA: Diagnosis not present

## 2013-09-04 DIAGNOSIS — E7800 Pure hypercholesterolemia, unspecified: Secondary | ICD-10-CM | POA: Insufficient documentation

## 2013-09-04 DIAGNOSIS — F411 Generalized anxiety disorder: Secondary | ICD-10-CM | POA: Diagnosis not present

## 2013-09-04 DIAGNOSIS — R404 Transient alteration of awareness: Secondary | ICD-10-CM | POA: Diagnosis not present

## 2013-09-04 DIAGNOSIS — Z9889 Other specified postprocedural states: Secondary | ICD-10-CM | POA: Insufficient documentation

## 2013-09-04 DIAGNOSIS — R269 Unspecified abnormalities of gait and mobility: Secondary | ICD-10-CM | POA: Insufficient documentation

## 2013-09-04 DIAGNOSIS — Z794 Long term (current) use of insulin: Secondary | ICD-10-CM | POA: Insufficient documentation

## 2013-09-04 DIAGNOSIS — R29898 Other symptoms and signs involving the musculoskeletal system: Secondary | ICD-10-CM

## 2013-09-04 DIAGNOSIS — Z7982 Long term (current) use of aspirin: Secondary | ICD-10-CM | POA: Insufficient documentation

## 2013-09-04 DIAGNOSIS — R2681 Unsteadiness on feet: Secondary | ICD-10-CM

## 2013-09-04 DIAGNOSIS — F3289 Other specified depressive episodes: Secondary | ICD-10-CM | POA: Insufficient documentation

## 2013-09-04 DIAGNOSIS — R5381 Other malaise: Secondary | ICD-10-CM | POA: Diagnosis not present

## 2013-09-04 DIAGNOSIS — R29818 Other symptoms and signs involving the nervous system: Secondary | ICD-10-CM | POA: Diagnosis not present

## 2013-09-04 LAB — COMPREHENSIVE METABOLIC PANEL
ALT: 12 U/L (ref 0–35)
ANION GAP: 14 (ref 5–15)
AST: 18 U/L (ref 0–37)
Albumin: 3.6 g/dL (ref 3.5–5.2)
Alkaline Phosphatase: 68 U/L (ref 39–117)
BUN: 14 mg/dL (ref 6–23)
CO2: 24 mEq/L (ref 19–32)
CREATININE: 0.75 mg/dL (ref 0.50–1.10)
Calcium: 9.7 mg/dL (ref 8.4–10.5)
Chloride: 105 mEq/L (ref 96–112)
GFR calc Af Amer: >90 mL/min (ref >90)
GFR calc non Af Amer: 81 mL/min — ABNORMAL LOW (ref >90)
Glucose, Bld: 215 mg/dL — ABNORMAL HIGH (ref 70–99)
Potassium: 3.6 mEq/L — ABNORMAL LOW (ref 3.7–5.3)
Sodium: 143 mEq/L (ref 137–147)
Total Bilirubin: 0.5 mg/dL (ref 0.3–1.2)
Total Protein: 6.9 g/dL (ref 6.0–8.3)

## 2013-09-04 LAB — URINALYSIS, ROUTINE W REFLEX MICROSCOPIC
Bilirubin Urine: NEGATIVE
Glucose, UA: 500 mg/dL — AB
Hgb urine dipstick: NEGATIVE
Ketones, ur: NEGATIVE mg/dL
LEUKOCYTES UA: NEGATIVE
Nitrite: NEGATIVE
PH: 6 (ref 5.0–8.0)
Protein, ur: 30 mg/dL — AB
Specific Gravity, Urine: 1.016 (ref 1.005–1.030)
Urobilinogen, UA: 1 mg/dL (ref 0.0–1.0)

## 2013-09-04 LAB — CBC WITH DIFFERENTIAL/PLATELET
Basophils Absolute: 0 10*3/uL (ref 0.0–0.1)
Basophils Relative: 0 % (ref 0–1)
EOS ABS: 0 10*3/uL (ref 0.0–0.7)
Eosinophils Relative: 0 % (ref 0–5)
HCT: 34.5 % — ABNORMAL LOW (ref 36.0–46.0)
HEMOGLOBIN: 11.7 g/dL — AB (ref 12.0–15.0)
Lymphocytes Relative: 14 % (ref 12–46)
Lymphs Abs: 0.9 10*3/uL (ref 0.7–4.0)
MCH: 27.7 pg (ref 26.0–34.0)
MCHC: 33.9 g/dL (ref 30.0–36.0)
MCV: 81.8 fL (ref 78.0–100.0)
MONOS PCT: 7 % (ref 3–12)
Monocytes Absolute: 0.4 10*3/uL (ref 0.1–1.0)
NEUTROS PCT: 79 % — AB (ref 43–77)
Neutro Abs: 4.9 10*3/uL (ref 1.7–7.7)
Platelets: 233 10*3/uL (ref 150–400)
RBC: 4.22 MIL/uL (ref 3.87–5.11)
RDW: 15 % (ref 11.5–15.5)
WBC: 6.2 10*3/uL (ref 4.0–10.5)

## 2013-09-04 LAB — URINE MICROSCOPIC-ADD ON

## 2013-09-04 MED ORDER — NITROFURANTOIN MONOHYD MACRO 100 MG PO CAPS: 100.0000 mg | ORAL_CAPSULE | Freq: Two times a day (BID) | ORAL | Status: DC

## 2013-09-04 NOTE — ED Notes (Signed)
Bedside commode attempted.  Catheter discussed with family will preform soon

## 2013-09-04 NOTE — ED Notes (Signed)
Bed: [facility] Expected date:  Expected time:  Means of arrival:  Comments: 

## 2013-09-04 NOTE — Progress Notes (Signed)
  CARE MANAGEMENT ED NOTE 09/04/2013  Patient:  [name], [name]   Account Number:  [id number]  Date Initiated:  09/04/2013  Documentation initiated by:  Livia Snellen  Subjective/Objective Assessment:   Patient presents to ED after slipping out of bed this am and landing on her buttocks.  Weaknessand internittent confusion     Subjective/Objective Assessment Detail:     Action/Plan:   Action/Plan Detail:   Anticipated DC Date:       Status Recommendation to Physician:   Result of Recommendation:    Other ED Services  Consult Working Plan   In-house referral  Clinical Social Worker   DC Forensic scientist  Other    Choice offered to / List presented to:  C-4 Adult Children         [place] [facility]  [place]    Status of service:  Completed, signed off  ED Comments:   ED Comments Detail:  EDCM spoke to patient and her daughter at bedside.  Patient lives at home with her daughter [name]. Patient's daughter reports patient is currently receiving home health services from [place], [place], [place], but they haven't started yet.  Patient has a walker, cane, shower chair, bedside commode and wheelchair at home.  Patient's daughter reports patient needs assistance with ADL's, but patient can feed herself.  Patient's daughter reports that the patient is by herself [name], and Friday because patient's husband goes to dialysis.  Patient's daughter reports, "It is really getting to the point where we are thinking of having her placed in a facility because we are starting to not be able to take care of her anymore."  [facility] informed patient's daughter that patient's pcp can assist her in placing the patient from home.  Patient's daughter replied, "That's why I called him today."  [facility] consulted EDSW.  EDCM called [facility] and spoke to [place] at 1738pm who confirmed patient is active with their services for RN, PT, OT, and aide.  EDCM provided patient's daughter with list of private  duty nursing agencies  and list of home health agencies in [place] highlighting [place]. EDCM explained to patient's daughter that private duty nursing would be an out of pocket expense for her. Patient's daughter reports she will look into private duty nursing for the days the patient is home by herself. No further EDCM needs at this time.

## 2013-09-04 NOTE — Progress Notes (Signed)
09/04/2013 [name] [facility] [id number] Patient up for discharge.  EDCM spoke to patient at bedside and is agreeable to add social work to [facility] health orders to assist with possible placement.  EDCM discussed patient with EDP who placed order for home health social work and face to face.  [facility] faxed order for social work to [employer] at [phone] at 2004pm.  No further [facility] needs at this time.

## 2013-09-04 NOTE — ED Provider Notes (Signed)
CSN: [id number]     Arrival date & time 09/04/13  1342 History   First MD Initiated Contact with Patient 09/04/13 1459     Chief Complaint  Patient presents with  . Fall     (Consider location/radiation/quality/duration/timing/severity/associated sxs/prior Treatment) HPI 73 year old female presents with weakness and intermittent confusion. The daughter gives most of the history and states his been going on for several weeks. The week before July 4 patient seemed to exert is of more than normal and the family noted that her right eyelid was drooping a little and she had right arm and leg weakness. The arm has improved and she's been normal strength the right leg is continued to be weak. Last month she had surgery on her back for a broken back. Patient not having any worsening back pain. No fecal incontinence. She is chronically urinary incontinent and is not new. The daughter states that when she was in her rehabilitation facility after her back surgery the facility told her that the patient had a low potassium and she's not know the number. She increased potassium at home from 20-40 mEq daily. No known fevers at home. The patient has had confusion every couple days. It seems that this is a temporary confusion but then spontaneous the results. At this moment the patient is at her normal baseline.  Past Medical History  Diagnosis Date  . Anxiety   . Dyspnea   . Diabetes mellitus   . Osteoarthritis   . Hypercholesteremia   . Depression   . RLS (restless legs syndrome)   . Stress   . PONV (postoperative nausea and vomiting)   . Headache(784.0)   . HTN (hypertension)     dr Johnsie Cancel   Past Surgical History  Procedure Laterality Date  . Colonoscopy    . Abdominal hysterectomy    . Lumbar laminectomy/decompression microdiscectomy N/A 04/18/2012    Procedure: LUMBAR LAMINECTOMY/DECOMPRESSION MICRODISCECTOMY 3 LEVELS;  Surgeon: Winfield Cunas, MD;  Location: [facility];  Service: Neurosurgery;   Laterality: N/A;  Lumbar three-four, lumbar four-five, lumbar five-sacral one decompression. Synovial cyst resection lumbar four-five  . Joint replacement    . Left forearm fracture with orif Left    Family History  Problem Relation Age of Onset  . Diabetes Father   . High blood pressure Father   . High blood pressure Mother    History  Substance Use Topics  . Smoking status: Never Smoker   . Smokeless tobacco: Never Used  . Alcohol Use: No   OB History   Grav Para Term Preterm Abortions TAB SAB Ect Mult Living                 Review of Systems  Constitutional: Negative for fever.  Respiratory: Negative for cough and shortness of breath.   Gastrointestinal: Negative for vomiting, abdominal pain and diarrhea.  Musculoskeletal: Negative for back pain.  Neurological: Positive for weakness.  Psychiatric/Behavioral: Positive for confusion.  All other systems reviewed and are negative.     Allergies  Morphine and related; Oxycodone; and Sulfonamide derivatives  Home Medications   Prior to Admission medications   Medication Sig Start Date End Date Taking? Authorizing Provider  acetaminophen (TYLENOL) 500 MG tablet Take 1 tablet (500 mg total) by mouth every 6 (six) hours as needed. 07/26/13  Yes [name], MD  ALPRAZolam Duanne Moron) 0.5 MG tablet Take 0.5 mg by mouth at bedtime.    Yes Historical Provider, MD  aspirin EC 81 MG tablet Take  81 mg by mouth daily.   Yes Historical Provider, MD  B Complex Vitamins (B-COMPLEX/B-12 PO) Take by mouth daily. Once a week.   Yes Historical Provider, MD  bisacodyl (DULCOLAX) 10 MG suppository Place 10 mg rectally as needed for moderate constipation.   Yes Historical Provider, MD  gabapentin (NEURONTIN) 300 MG capsule Take 300 mg by mouth See admin instructions. Take one capsule in the am and two capsules at night for restless leg.   Yes Historical Provider, MD  glipiZIDE (GLUCOTROL XL) 5 MG 24 hr tablet Take 5 mg by mouth 2 (two) times  daily.    Yes Historical Provider, MD  HYDROcodone-acetaminophen (NORCO/VICODIN) 5-325 MG per tablet Take 1 tablet by mouth every 6 (six) hours as needed for moderate pain (pain).   Yes Historical Provider, MD  insulin aspart (NOVOLOG) 100 UNIT/ML injection Inject 0-15 Units into the skin 3 (three) times daily with meals. 07/26/13  Yes [name], MD  insulin aspart (NOVOLOG) 100 UNIT/ML injection Inject 0-5 Units into the skin at bedtime. 07/26/13  Yes [name], MD  losartan (COZAAR) 100 MG tablet Take 100 mg by mouth daily.   Yes Historical Provider, MD  magnesium hydroxide (MILK OF MAGNESIA) 400 MG/5ML suspension Take 30 mLs by mouth daily as needed for mild constipation or moderate constipation. 07/26/13  Yes [name], MD  metFORMIN (GLUCOPHAGE) 500 MG tablet Take 500 mg by mouth 2 (two) times daily with a meal.   Yes Historical Provider, MD  metoCLOPramide (REGLAN) 10 MG tablet Take 10 mg by mouth See admin instructions. Give one tablet by mouth before meals and at bedtime for nausea and vomitting. Give 30 min befre meals.   Yes Historical Provider, MD  metoprolol (TOPROL-XL) 100 MG 24 hr tablet Take 100 mg by mouth daily.     Yes Historical Provider, MD  ondansetron (ZOFRAN-ODT) 4 MG disintegrating tablet Take 4 mg by mouth every 8 (eight) hours as needed for nausea or vomiting.   Yes Historical Provider, MD  pantoprazole (PROTONIX) 40 MG tablet Take 40 mg by mouth daily.   Yes Historical Provider, MD  polyethylene glycol (MIRALAX / GLYCOLAX) packet Take 17 g by mouth daily. 07/26/13  Yes [name], MD  potassium chloride SA (K-DUR,KLOR-CON) 20 MEQ tablet Take 40 mEq by mouth daily.    Yes Historical Provider, MD  pramipexole (MIRAPEX) 0.25 MG tablet Take 0.25 mg by mouth 2 (two) times daily. Morning and night.   Yes Historical Provider, MD  senna-docusate (SENOKOT-S) 8.6-50 MG per tablet Take 1 tablet by mouth at bedtime as needed for mild constipation. 07/26/13  Yes [name], MD  sertraline (ZOLOFT) 50 MG tablet Take 50 mg by mouth daily.   Yes Historical Provider, MD  traMADol (ULTRAM) 50 MG tablet Take 1 tablet (50 mg total) by mouth every 6 (six) hours as needed for moderate pain or severe pain. 07/26/13  Yes [name], MD  verapamil (CALAN-SR) 240 MG CR tablet Take 1 tablet (240 mg total) by mouth daily. 07/26/13  Yes [name], MD   BP 153/90  Pulse 78  Temp(Src) 98.9 F (37.2 C) (Oral)  Resp 20  SpO2 93% Physical Exam  Nursing note and vitals reviewed. Constitutional: She is oriented to person, place, and time. She appears well-developed and well-nourished. No distress.  HENT:  Head: Normocephalic and atraumatic.  Right Ear: External ear normal.  Left Ear: External ear normal.  Nose: Nose normal.  Eyes: Right eye exhibits no discharge. Left  eye exhibits no discharge.  Cardiovascular: Normal rate, regular rhythm and normal heart sounds.   Pulmonary/Chest: Effort normal and breath sounds normal.  Abdominal: Soft. She exhibits no distension. There is no tenderness.  Neurological: She is alert and oriented to person, place, and time.  CN 2-12 grossly intact. Upper extremities with equal and normal strength bilaterally. Both legs barely able to lift off bed by themselves. Unable to stay up vs resistance. When I lift her legs off bed they slowly go back to bed. Normal gross sensation  Skin: Skin is warm and dry.    ED Course  Procedures (including critical care time) Labs Review Labs Reviewed  CBC WITH DIFFERENTIAL - Abnormal; Notable for the following:    Hemoglobin 11.7 (*)    HCT 34.5 (*)    Neutrophils Relative % 79 (*)    All other components within normal limits  COMPREHENSIVE METABOLIC PANEL - Abnormal; Notable for the following:    Potassium 3.6 (*)    Glucose, Bld 215 (*)    GFR calc non Af Amer 81 (*)    All other components within normal limits  URINALYSIS, ROUTINE W REFLEX MICROSCOPIC - Abnormal; Notable for the  following:    APPearance CLOUDY (*)    Glucose, UA 500 (*)    Protein, ur 30 (*)    All other components within normal limits  URINE MICROSCOPIC-ADD ON - Abnormal; Notable for the following:    Bacteria, UA MANY (*)    All other components within normal limits  URINE CULTURE  CBG MONITORING, ED    Imaging Review Ct Head Wo Contrast  09/04/2013   CLINICAL DATA:  Right-sided weakness x3 weeks  EXAM: CT HEAD WITHOUT CONTRAST  TECHNIQUE: Contiguous axial images were obtained from the base of the skull through the vertex without intravenous contrast.  COMPARISON:  MRI brain dated 05/23/2011  FINDINGS: No evidence of parenchymal hemorrhage or extra-axial fluid collection. No mass lesion, mass effect, or midline shift.  No CT evidence of acute infarction.  Subcortical white matter and periventricular small vessel ischemic changes. Intracranial atherosclerosis.  Global cortical and central atrophy.  Secondary ventriculomegaly.  Mild partial opacification of the left maxillary sinus. Mastoid air cells are essentially clear.  No evidence of calvarial fracture.  IMPRESSION: No evidence of acute intracranial abnormality.  Atrophy with secondary ventriculomegaly.  Small vessel ischemic changes with intracranial atherosclerosis.   Electronically Signed   By: Julian Hy M.D.   On: 09/04/2013 18:04     EKG Interpretation   Date/Time:  Wednesday September 04 2013 15:36:37 EDT Ventricular Rate:  77 PR Interval:  160 QRS Duration: 89 QT Interval:  513 QTC Calculation: 581 R Axis:   -137 Text Interpretation:  Sinus rhythm Probable left atrial enlargement  Markedly posterior QRS axis Abnormal T, consider ischemia, diffuse leads  Prolonged QT interval Baseline wander in lead(s) V4 T wave changes are  similar to multiple prior EKGs Confirmed by Soham ([id number]) on  09/04/2013 3:39:13 PM      MDM   Final diagnoses:  Right leg weakness  Gait instability  UTI (lower urinary tract infection)     Patient's weakness is probably from a stroke a couple weeks ago. The family is describing that the patient had right eye drooping, right arm and leg weakness. The symptoms seemed to resolve the leg weakness is still there. I highly doubt this is related to her back operation as it all started as a complete right-sided issue.  She has been having cloudy and foul smelling urine. There is many bacteria on her urinalysis the without leukocytes. At this point we'll send for culture but treat with Macrobid as her most recent urine culture that had over 100,000 CFU was sensitive to this. Otherwise her workup is unremarkable. Her main issue seems to be home health care versus being placed in a nursing home. She does not need any admission criteria this time. After discussion with family, they have discussed with the social worker and felt comfortable going home at this time. They have a home health nurse coming to visit them tomorrow.    Ephraim Hamburger, MD 09/04/13 [phone]

## 2013-09-04 NOTE — ED Notes (Signed)
Per EMS, pt states she slipped out of bed this morning when trying to get out of bed, falling on her buttocks. Pt was unable to get off the floor. Pt states she remembers trying to get off the bed to see her husband when she fell. Pt denies LOC or head injury. Pt denies pain at this time. Pt A&Ox4.

## 2013-09-04 NOTE — Progress Notes (Signed)
CSW met with the patient and family at bedside to complete this assessment.  Patient is alert, oriented x3, and cooperative.  Patient's daughter at bedside reports that her mother was recently at [facility] for rehab after breaking her back the week before [facility] day.  She reports that her mother did great job at the rehab until the day she was being discharge. On the day of discharge the patient walk almost the entire length of the facility to sit in the common area but after sitting for 2 hours could not walk back to her room.  The rehab kept the patient past her discharge date to evaluate any possible medical factors contributing to her abrupt decline in mobility.  At this time the patient is saying she has a fear of falling therefore would rather use a wheelchair than attempt to walk.  CSW spoke with the patient in an attempt to encourage her to make a greater attempt to ambulate but the patient was not receptive to any rational.  CSW encouraged the patient to participate with physical therapy and she is refusing at this time.  CSW provided the family with resources for SNF and ALF facilities in the [place] per their request.  No further follow up is needed at this time.     Chesley Noon, MSW, [place], 09/04/2013 Evening Clinical Social Worker [phone]

## 2013-09-04 NOTE — ED Notes (Signed)
MD at bedside. 

## 2013-09-05 DIAGNOSIS — M8448XD Pathological fracture, other site, subsequent encounter for fracture with routine healing: Secondary | ICD-10-CM | POA: Diagnosis not present

## 2013-09-05 DIAGNOSIS — F3289 Other specified depressive episodes: Secondary | ICD-10-CM | POA: Diagnosis not present

## 2013-09-05 DIAGNOSIS — M48061 Spinal stenosis, lumbar region without neurogenic claudication: Secondary | ICD-10-CM | POA: Diagnosis not present

## 2013-09-05 DIAGNOSIS — F329 Major depressive disorder, single episode, unspecified: Secondary | ICD-10-CM | POA: Diagnosis not present

## 2013-09-05 DIAGNOSIS — I10 Essential (primary) hypertension: Secondary | ICD-10-CM | POA: Diagnosis not present

## 2013-09-05 DIAGNOSIS — E119 Type 2 diabetes mellitus without complications: Secondary | ICD-10-CM | POA: Diagnosis not present

## 2013-09-05 DIAGNOSIS — I509 Heart failure, unspecified: Secondary | ICD-10-CM | POA: Diagnosis not present

## 2013-09-05 LAB — CBG MONITORING, ED: GLUCOSE-CAPILLARY: 185 mg/dL — AB (ref 70–99)

## 2013-09-05 NOTE — Progress Notes (Signed)
09/05/2013 A. Freddy Spadafora RNCM  EDCM called [facility] and spoke to [place] who reports fax for social worker had not gone through.  Judeen Hammans supplied [facility] with new fax number [phone]. EDCM refaxed hom ehealth order for social worker at 1309pm with confirmation or receipt at 1315pm.  No further EDCM needs at this time.

## 2013-09-07 DIAGNOSIS — I509 Heart failure, unspecified: Secondary | ICD-10-CM | POA: Diagnosis not present

## 2013-09-07 DIAGNOSIS — I10 Essential (primary) hypertension: Secondary | ICD-10-CM | POA: Diagnosis not present

## 2013-09-07 DIAGNOSIS — F3289 Other specified depressive episodes: Secondary | ICD-10-CM | POA: Diagnosis not present

## 2013-09-07 DIAGNOSIS — M48061 Spinal stenosis, lumbar region without neurogenic claudication: Secondary | ICD-10-CM | POA: Diagnosis not present

## 2013-09-07 DIAGNOSIS — M8448XD Pathological fracture, other site, subsequent encounter for fracture with routine healing: Secondary | ICD-10-CM | POA: Diagnosis not present

## 2013-09-07 DIAGNOSIS — E119 Type 2 diabetes mellitus without complications: Secondary | ICD-10-CM | POA: Diagnosis not present

## 2013-09-07 DIAGNOSIS — F329 Major depressive disorder, single episode, unspecified: Secondary | ICD-10-CM | POA: Diagnosis not present

## 2013-09-07 LAB — URINE CULTURE: Colony Count: >=100000

## 2013-09-08 ENCOUNTER — Telehealth (HOSPITAL_BASED_OUTPATIENT_CLINIC_OR_DEPARTMENT_OTHER): Payer: Self-pay | Admitting: Emergency Medicine

## 2013-09-08 DIAGNOSIS — M48061 Spinal stenosis, lumbar region without neurogenic claudication: Secondary | ICD-10-CM | POA: Diagnosis not present

## 2013-09-08 DIAGNOSIS — I509 Heart failure, unspecified: Secondary | ICD-10-CM | POA: Diagnosis not present

## 2013-09-08 DIAGNOSIS — I10 Essential (primary) hypertension: Secondary | ICD-10-CM | POA: Diagnosis not present

## 2013-09-08 DIAGNOSIS — F329 Major depressive disorder, single episode, unspecified: Secondary | ICD-10-CM | POA: Diagnosis not present

## 2013-09-08 DIAGNOSIS — F3289 Other specified depressive episodes: Secondary | ICD-10-CM | POA: Diagnosis not present

## 2013-09-08 DIAGNOSIS — M8448XD Pathological fracture, other site, subsequent encounter for fracture with routine healing: Secondary | ICD-10-CM | POA: Diagnosis not present

## 2013-09-08 DIAGNOSIS — E119 Type 2 diabetes mellitus without complications: Secondary | ICD-10-CM | POA: Diagnosis not present

## 2013-09-08 NOTE — Telephone Encounter (Signed)
Post ED Visit - Positive Culture Follow-up  Culture report reviewed by antimicrobial stewardship pharmacist: []  Wes Dulaney, Pharm.D., BCPS [x]  Heide Guile, Pharm.D., BCPS []  Alycia Rossetti, Pharm.D., BCPS []  [place], Pharm.D., BCPS, AAHIVP []  Legrand Como, Pharm.D., BCPS, AAHIVP []    Positive Urine culture Treated with Macrobid, organism sensitive to the same and no further patient follow-up is required at this time.  Ernesta Amble 09/08/2013, 11:57 AM

## 2013-09-09 DIAGNOSIS — E119 Type 2 diabetes mellitus without complications: Secondary | ICD-10-CM | POA: Diagnosis not present

## 2013-09-09 DIAGNOSIS — M8448XD Pathological fracture, other site, subsequent encounter for fracture with routine healing: Secondary | ICD-10-CM | POA: Diagnosis not present

## 2013-09-09 DIAGNOSIS — F329 Major depressive disorder, single episode, unspecified: Secondary | ICD-10-CM | POA: Diagnosis not present

## 2013-09-09 DIAGNOSIS — M48061 Spinal stenosis, lumbar region without neurogenic claudication: Secondary | ICD-10-CM | POA: Diagnosis not present

## 2013-09-09 DIAGNOSIS — F3289 Other specified depressive episodes: Secondary | ICD-10-CM | POA: Diagnosis not present

## 2013-09-09 DIAGNOSIS — I509 Heart failure, unspecified: Secondary | ICD-10-CM | POA: Diagnosis not present

## 2013-09-09 DIAGNOSIS — I10 Essential (primary) hypertension: Secondary | ICD-10-CM | POA: Diagnosis not present

## 2013-09-11 DIAGNOSIS — F329 Major depressive disorder, single episode, unspecified: Secondary | ICD-10-CM | POA: Diagnosis not present

## 2013-09-11 DIAGNOSIS — M8448XD Pathological fracture, other site, subsequent encounter for fracture with routine healing: Secondary | ICD-10-CM | POA: Diagnosis not present

## 2013-09-11 DIAGNOSIS — E119 Type 2 diabetes mellitus without complications: Secondary | ICD-10-CM | POA: Diagnosis not present

## 2013-09-11 DIAGNOSIS — F3289 Other specified depressive episodes: Secondary | ICD-10-CM | POA: Diagnosis not present

## 2013-09-11 DIAGNOSIS — I10 Essential (primary) hypertension: Secondary | ICD-10-CM | POA: Diagnosis not present

## 2013-09-11 DIAGNOSIS — M48061 Spinal stenosis, lumbar region without neurogenic claudication: Secondary | ICD-10-CM | POA: Diagnosis not present

## 2013-09-11 DIAGNOSIS — I509 Heart failure, unspecified: Secondary | ICD-10-CM | POA: Diagnosis not present

## 2013-09-12 DIAGNOSIS — E119 Type 2 diabetes mellitus without complications: Secondary | ICD-10-CM | POA: Diagnosis not present

## 2013-09-12 DIAGNOSIS — I509 Heart failure, unspecified: Secondary | ICD-10-CM | POA: Diagnosis not present

## 2013-09-12 DIAGNOSIS — M48061 Spinal stenosis, lumbar region without neurogenic claudication: Secondary | ICD-10-CM | POA: Diagnosis not present

## 2013-09-12 DIAGNOSIS — F3289 Other specified depressive episodes: Secondary | ICD-10-CM | POA: Diagnosis not present

## 2013-09-12 DIAGNOSIS — M8448XD Pathological fracture, other site, subsequent encounter for fracture with routine healing: Secondary | ICD-10-CM | POA: Diagnosis not present

## 2013-09-12 DIAGNOSIS — I10 Essential (primary) hypertension: Secondary | ICD-10-CM | POA: Diagnosis not present

## 2013-09-12 DIAGNOSIS — F329 Major depressive disorder, single episode, unspecified: Secondary | ICD-10-CM | POA: Diagnosis not present

## 2013-09-14 DIAGNOSIS — E119 Type 2 diabetes mellitus without complications: Secondary | ICD-10-CM | POA: Diagnosis not present

## 2013-09-14 DIAGNOSIS — M8448XD Pathological fracture, other site, subsequent encounter for fracture with routine healing: Secondary | ICD-10-CM | POA: Diagnosis not present

## 2013-09-14 DIAGNOSIS — F3289 Other specified depressive episodes: Secondary | ICD-10-CM | POA: Diagnosis not present

## 2013-09-14 DIAGNOSIS — F329 Major depressive disorder, single episode, unspecified: Secondary | ICD-10-CM | POA: Diagnosis not present

## 2013-09-14 DIAGNOSIS — M48061 Spinal stenosis, lumbar region without neurogenic claudication: Secondary | ICD-10-CM | POA: Diagnosis not present

## 2013-09-14 DIAGNOSIS — I10 Essential (primary) hypertension: Secondary | ICD-10-CM | POA: Diagnosis not present

## 2013-09-14 DIAGNOSIS — I509 Heart failure, unspecified: Secondary | ICD-10-CM | POA: Diagnosis not present

## 2013-09-16 DIAGNOSIS — I10 Essential (primary) hypertension: Secondary | ICD-10-CM | POA: Diagnosis not present

## 2013-09-16 DIAGNOSIS — M48061 Spinal stenosis, lumbar region without neurogenic claudication: Secondary | ICD-10-CM | POA: Diagnosis not present

## 2013-09-16 DIAGNOSIS — M8448XD Pathological fracture, other site, subsequent encounter for fracture with routine healing: Secondary | ICD-10-CM | POA: Diagnosis not present

## 2013-09-16 DIAGNOSIS — F3289 Other specified depressive episodes: Secondary | ICD-10-CM | POA: Diagnosis not present

## 2013-09-16 DIAGNOSIS — F329 Major depressive disorder, single episode, unspecified: Secondary | ICD-10-CM | POA: Diagnosis not present

## 2013-09-16 DIAGNOSIS — I509 Heart failure, unspecified: Secondary | ICD-10-CM | POA: Diagnosis not present

## 2013-09-16 DIAGNOSIS — E119 Type 2 diabetes mellitus without complications: Secondary | ICD-10-CM | POA: Diagnosis not present

## 2013-09-19 DIAGNOSIS — M48061 Spinal stenosis, lumbar region without neurogenic claudication: Secondary | ICD-10-CM | POA: Diagnosis not present

## 2013-09-19 DIAGNOSIS — F3289 Other specified depressive episodes: Secondary | ICD-10-CM | POA: Diagnosis not present

## 2013-09-19 DIAGNOSIS — M8448XD Pathological fracture, other site, subsequent encounter for fracture with routine healing: Secondary | ICD-10-CM | POA: Diagnosis not present

## 2013-09-19 DIAGNOSIS — F329 Major depressive disorder, single episode, unspecified: Secondary | ICD-10-CM | POA: Diagnosis not present

## 2013-09-19 DIAGNOSIS — I10 Essential (primary) hypertension: Secondary | ICD-10-CM | POA: Diagnosis not present

## 2013-09-19 DIAGNOSIS — E119 Type 2 diabetes mellitus without complications: Secondary | ICD-10-CM | POA: Diagnosis not present

## 2013-09-19 DIAGNOSIS — I509 Heart failure, unspecified: Secondary | ICD-10-CM | POA: Diagnosis not present

## 2013-09-24 DIAGNOSIS — M6281 Muscle weakness (generalized): Secondary | ICD-10-CM | POA: Diagnosis not present

## 2013-09-24 DIAGNOSIS — I509 Heart failure, unspecified: Secondary | ICD-10-CM | POA: Diagnosis not present

## 2013-09-24 DIAGNOSIS — E119 Type 2 diabetes mellitus without complications: Secondary | ICD-10-CM | POA: Diagnosis not present

## 2013-09-24 DIAGNOSIS — M48061 Spinal stenosis, lumbar region without neurogenic claudication: Secondary | ICD-10-CM | POA: Diagnosis not present

## 2013-09-24 DIAGNOSIS — F3289 Other specified depressive episodes: Secondary | ICD-10-CM | POA: Diagnosis not present

## 2013-09-24 DIAGNOSIS — F329 Major depressive disorder, single episode, unspecified: Secondary | ICD-10-CM | POA: Diagnosis not present

## 2013-09-24 DIAGNOSIS — S0990XA Unspecified injury of head, initial encounter: Secondary | ICD-10-CM | POA: Diagnosis not present

## 2013-09-24 DIAGNOSIS — G8911 Acute pain due to trauma: Secondary | ICD-10-CM | POA: Diagnosis not present

## 2013-09-24 DIAGNOSIS — I10 Essential (primary) hypertension: Secondary | ICD-10-CM | POA: Diagnosis not present

## 2013-09-24 DIAGNOSIS — M8448XD Pathological fracture, other site, subsequent encounter for fracture with routine healing: Secondary | ICD-10-CM | POA: Diagnosis not present

## 2013-09-26 DIAGNOSIS — I509 Heart failure, unspecified: Secondary | ICD-10-CM | POA: Diagnosis not present

## 2013-09-26 DIAGNOSIS — F329 Major depressive disorder, single episode, unspecified: Secondary | ICD-10-CM | POA: Diagnosis not present

## 2013-09-26 DIAGNOSIS — M8448XD Pathological fracture, other site, subsequent encounter for fracture with routine healing: Secondary | ICD-10-CM | POA: Diagnosis not present

## 2013-09-26 DIAGNOSIS — M48061 Spinal stenosis, lumbar region without neurogenic claudication: Secondary | ICD-10-CM | POA: Diagnosis not present

## 2013-09-26 DIAGNOSIS — F3289 Other specified depressive episodes: Secondary | ICD-10-CM | POA: Diagnosis not present

## 2013-09-26 DIAGNOSIS — I10 Essential (primary) hypertension: Secondary | ICD-10-CM | POA: Diagnosis not present

## 2013-09-26 DIAGNOSIS — E119 Type 2 diabetes mellitus without complications: Secondary | ICD-10-CM | POA: Diagnosis not present

## 2013-09-30 DIAGNOSIS — I509 Heart failure, unspecified: Secondary | ICD-10-CM | POA: Diagnosis not present

## 2013-09-30 DIAGNOSIS — M8448XD Pathological fracture, other site, subsequent encounter for fracture with routine healing: Secondary | ICD-10-CM | POA: Diagnosis not present

## 2013-09-30 DIAGNOSIS — F329 Major depressive disorder, single episode, unspecified: Secondary | ICD-10-CM | POA: Diagnosis not present

## 2013-09-30 DIAGNOSIS — I10 Essential (primary) hypertension: Secondary | ICD-10-CM | POA: Diagnosis not present

## 2013-09-30 DIAGNOSIS — E119 Type 2 diabetes mellitus without complications: Secondary | ICD-10-CM | POA: Diagnosis not present

## 2013-09-30 DIAGNOSIS — F3289 Other specified depressive episodes: Secondary | ICD-10-CM | POA: Diagnosis not present

## 2013-09-30 DIAGNOSIS — M48061 Spinal stenosis, lumbar region without neurogenic claudication: Secondary | ICD-10-CM | POA: Diagnosis not present

## 2013-10-02 DIAGNOSIS — E7800 Pure hypercholesterolemia, unspecified: Secondary | ICD-10-CM | POA: Diagnosis not present

## 2013-10-02 DIAGNOSIS — I10 Essential (primary) hypertension: Secondary | ICD-10-CM | POA: Diagnosis not present

## 2013-10-02 DIAGNOSIS — F3289 Other specified depressive episodes: Secondary | ICD-10-CM | POA: Diagnosis not present

## 2013-10-02 DIAGNOSIS — I509 Heart failure, unspecified: Secondary | ICD-10-CM | POA: Diagnosis not present

## 2013-10-02 DIAGNOSIS — R5381 Other malaise: Secondary | ICD-10-CM | POA: Diagnosis not present

## 2013-10-02 DIAGNOSIS — M48061 Spinal stenosis, lumbar region without neurogenic claudication: Secondary | ICD-10-CM | POA: Diagnosis not present

## 2013-10-02 DIAGNOSIS — D518 Other vitamin B12 deficiency anemias: Secondary | ICD-10-CM | POA: Diagnosis not present

## 2013-10-02 DIAGNOSIS — E559 Vitamin D deficiency, unspecified: Secondary | ICD-10-CM | POA: Diagnosis not present

## 2013-10-02 DIAGNOSIS — E119 Type 2 diabetes mellitus without complications: Secondary | ICD-10-CM | POA: Diagnosis not present

## 2013-10-02 DIAGNOSIS — Z79899 Other long term (current) drug therapy: Secondary | ICD-10-CM | POA: Diagnosis not present

## 2013-10-02 DIAGNOSIS — M8448XD Pathological fracture, other site, subsequent encounter for fracture with routine healing: Secondary | ICD-10-CM | POA: Diagnosis not present

## 2013-10-02 DIAGNOSIS — F329 Major depressive disorder, single episode, unspecified: Secondary | ICD-10-CM | POA: Diagnosis not present

## 2013-10-04 DIAGNOSIS — M48061 Spinal stenosis, lumbar region without neurogenic claudication: Secondary | ICD-10-CM | POA: Diagnosis not present

## 2013-10-04 DIAGNOSIS — E119 Type 2 diabetes mellitus without complications: Secondary | ICD-10-CM | POA: Diagnosis not present

## 2013-10-04 DIAGNOSIS — I509 Heart failure, unspecified: Secondary | ICD-10-CM | POA: Diagnosis not present

## 2013-10-04 DIAGNOSIS — I10 Essential (primary) hypertension: Secondary | ICD-10-CM | POA: Diagnosis not present

## 2013-10-04 DIAGNOSIS — F329 Major depressive disorder, single episode, unspecified: Secondary | ICD-10-CM | POA: Diagnosis not present

## 2013-10-04 DIAGNOSIS — M8448XD Pathological fracture, other site, subsequent encounter for fracture with routine healing: Secondary | ICD-10-CM | POA: Diagnosis not present

## 2013-10-04 DIAGNOSIS — F3289 Other specified depressive episodes: Secondary | ICD-10-CM | POA: Diagnosis not present

## 2013-10-07 DIAGNOSIS — E119 Type 2 diabetes mellitus without complications: Secondary | ICD-10-CM | POA: Diagnosis not present

## 2013-10-07 DIAGNOSIS — F329 Major depressive disorder, single episode, unspecified: Secondary | ICD-10-CM | POA: Diagnosis not present

## 2013-10-07 DIAGNOSIS — F3289 Other specified depressive episodes: Secondary | ICD-10-CM | POA: Diagnosis not present

## 2013-10-07 DIAGNOSIS — M48061 Spinal stenosis, lumbar region without neurogenic claudication: Secondary | ICD-10-CM | POA: Diagnosis not present

## 2013-10-07 DIAGNOSIS — M8448XD Pathological fracture, other site, subsequent encounter for fracture with routine healing: Secondary | ICD-10-CM | POA: Diagnosis not present

## 2013-10-07 DIAGNOSIS — I509 Heart failure, unspecified: Secondary | ICD-10-CM | POA: Diagnosis not present

## 2013-10-07 DIAGNOSIS — I10 Essential (primary) hypertension: Secondary | ICD-10-CM | POA: Diagnosis not present

## 2013-10-09 DIAGNOSIS — F329 Major depressive disorder, single episode, unspecified: Secondary | ICD-10-CM | POA: Diagnosis not present

## 2013-10-09 DIAGNOSIS — E119 Type 2 diabetes mellitus without complications: Secondary | ICD-10-CM | POA: Diagnosis not present

## 2013-10-09 DIAGNOSIS — F3289 Other specified depressive episodes: Secondary | ICD-10-CM | POA: Diagnosis not present

## 2013-10-09 DIAGNOSIS — M48061 Spinal stenosis, lumbar region without neurogenic claudication: Secondary | ICD-10-CM | POA: Diagnosis not present

## 2013-10-09 DIAGNOSIS — M8448XD Pathological fracture, other site, subsequent encounter for fracture with routine healing: Secondary | ICD-10-CM | POA: Diagnosis not present

## 2013-10-09 DIAGNOSIS — I509 Heart failure, unspecified: Secondary | ICD-10-CM | POA: Diagnosis not present

## 2013-10-09 DIAGNOSIS — I10 Essential (primary) hypertension: Secondary | ICD-10-CM | POA: Diagnosis not present

## 2013-10-14 DIAGNOSIS — G2581 Restless legs syndrome: Secondary | ICD-10-CM | POA: Diagnosis not present

## 2013-10-14 DIAGNOSIS — E1159 Type 2 diabetes mellitus with other circulatory complications: Secondary | ICD-10-CM | POA: Diagnosis not present

## 2013-10-14 DIAGNOSIS — I10 Essential (primary) hypertension: Secondary | ICD-10-CM | POA: Diagnosis not present

## 2013-10-14 DIAGNOSIS — E119 Type 2 diabetes mellitus without complications: Secondary | ICD-10-CM | POA: Diagnosis not present

## 2013-10-14 DIAGNOSIS — R112 Nausea with vomiting, unspecified: Secondary | ICD-10-CM | POA: Diagnosis not present

## 2013-10-14 DIAGNOSIS — R269 Unspecified abnormalities of gait and mobility: Secondary | ICD-10-CM | POA: Diagnosis not present

## 2013-10-14 DIAGNOSIS — R609 Edema, unspecified: Secondary | ICD-10-CM | POA: Diagnosis not present

## 2013-10-14 DIAGNOSIS — I739 Peripheral vascular disease, unspecified: Secondary | ICD-10-CM | POA: Diagnosis not present

## 2013-10-15 DIAGNOSIS — I10 Essential (primary) hypertension: Secondary | ICD-10-CM | POA: Diagnosis not present

## 2013-10-15 DIAGNOSIS — I509 Heart failure, unspecified: Secondary | ICD-10-CM | POA: Diagnosis not present

## 2013-10-15 DIAGNOSIS — M48061 Spinal stenosis, lumbar region without neurogenic claudication: Secondary | ICD-10-CM | POA: Diagnosis not present

## 2013-10-15 DIAGNOSIS — F3289 Other specified depressive episodes: Secondary | ICD-10-CM | POA: Diagnosis not present

## 2013-10-15 DIAGNOSIS — F329 Major depressive disorder, single episode, unspecified: Secondary | ICD-10-CM | POA: Diagnosis not present

## 2013-10-15 DIAGNOSIS — M8448XD Pathological fracture, other site, subsequent encounter for fracture with routine healing: Secondary | ICD-10-CM | POA: Diagnosis not present

## 2013-10-15 DIAGNOSIS — E119 Type 2 diabetes mellitus without complications: Secondary | ICD-10-CM | POA: Diagnosis not present

## 2013-10-17 DIAGNOSIS — E119 Type 2 diabetes mellitus without complications: Secondary | ICD-10-CM | POA: Diagnosis not present

## 2013-10-17 DIAGNOSIS — I10 Essential (primary) hypertension: Secondary | ICD-10-CM | POA: Diagnosis not present

## 2013-10-17 DIAGNOSIS — M48061 Spinal stenosis, lumbar region without neurogenic claudication: Secondary | ICD-10-CM | POA: Diagnosis not present

## 2013-10-17 DIAGNOSIS — F3289 Other specified depressive episodes: Secondary | ICD-10-CM | POA: Diagnosis not present

## 2013-10-17 DIAGNOSIS — I509 Heart failure, unspecified: Secondary | ICD-10-CM | POA: Diagnosis not present

## 2013-10-17 DIAGNOSIS — M8448XD Pathological fracture, other site, subsequent encounter for fracture with routine healing: Secondary | ICD-10-CM | POA: Diagnosis not present

## 2013-10-17 DIAGNOSIS — F329 Major depressive disorder, single episode, unspecified: Secondary | ICD-10-CM | POA: Diagnosis not present

## 2013-10-21 DIAGNOSIS — M48061 Spinal stenosis, lumbar region without neurogenic claudication: Secondary | ICD-10-CM | POA: Diagnosis not present

## 2013-10-21 DIAGNOSIS — I509 Heart failure, unspecified: Secondary | ICD-10-CM | POA: Diagnosis not present

## 2013-10-21 DIAGNOSIS — I10 Essential (primary) hypertension: Secondary | ICD-10-CM | POA: Diagnosis not present

## 2013-10-21 DIAGNOSIS — E119 Type 2 diabetes mellitus without complications: Secondary | ICD-10-CM | POA: Diagnosis not present

## 2013-10-21 DIAGNOSIS — M8448XD Pathological fracture, other site, subsequent encounter for fracture with routine healing: Secondary | ICD-10-CM | POA: Diagnosis not present

## 2013-10-21 DIAGNOSIS — F329 Major depressive disorder, single episode, unspecified: Secondary | ICD-10-CM | POA: Diagnosis not present

## 2013-10-21 DIAGNOSIS — F3289 Other specified depressive episodes: Secondary | ICD-10-CM | POA: Diagnosis not present

## 2013-10-22 DIAGNOSIS — I10 Essential (primary) hypertension: Secondary | ICD-10-CM | POA: Diagnosis not present

## 2013-10-22 DIAGNOSIS — S0990XA Unspecified injury of head, initial encounter: Secondary | ICD-10-CM | POA: Diagnosis not present

## 2013-10-22 DIAGNOSIS — E119 Type 2 diabetes mellitus without complications: Secondary | ICD-10-CM | POA: Diagnosis not present

## 2013-10-22 DIAGNOSIS — G8929 Other chronic pain: Secondary | ICD-10-CM | POA: Diagnosis not present

## 2013-10-23 DIAGNOSIS — G309 Alzheimer's disease, unspecified: Secondary | ICD-10-CM | POA: Diagnosis not present

## 2013-10-23 DIAGNOSIS — R350 Frequency of micturition: Secondary | ICD-10-CM | POA: Diagnosis not present

## 2013-10-23 DIAGNOSIS — F0280 Dementia in other diseases classified elsewhere without behavioral disturbance: Secondary | ICD-10-CM | POA: Diagnosis not present

## 2013-10-24 DIAGNOSIS — I509 Heart failure, unspecified: Secondary | ICD-10-CM | POA: Diagnosis not present

## 2013-10-24 DIAGNOSIS — M48061 Spinal stenosis, lumbar region without neurogenic claudication: Secondary | ICD-10-CM | POA: Diagnosis not present

## 2013-10-24 DIAGNOSIS — I10 Essential (primary) hypertension: Secondary | ICD-10-CM | POA: Diagnosis not present

## 2013-10-24 DIAGNOSIS — M8448XD Pathological fracture, other site, subsequent encounter for fracture with routine healing: Secondary | ICD-10-CM | POA: Diagnosis not present

## 2013-10-24 DIAGNOSIS — F3289 Other specified depressive episodes: Secondary | ICD-10-CM | POA: Diagnosis not present

## 2013-10-24 DIAGNOSIS — E119 Type 2 diabetes mellitus without complications: Secondary | ICD-10-CM | POA: Diagnosis not present

## 2013-10-24 DIAGNOSIS — F329 Major depressive disorder, single episode, unspecified: Secondary | ICD-10-CM | POA: Diagnosis not present

## 2013-10-29 DIAGNOSIS — I10 Essential (primary) hypertension: Secondary | ICD-10-CM | POA: Diagnosis not present

## 2013-10-29 DIAGNOSIS — R32 Unspecified urinary incontinence: Secondary | ICD-10-CM | POA: Diagnosis not present

## 2013-10-29 DIAGNOSIS — N390 Urinary tract infection, site not specified: Secondary | ICD-10-CM | POA: Diagnosis not present

## 2013-10-29 DIAGNOSIS — B354 Tinea corporis: Secondary | ICD-10-CM | POA: Diagnosis not present

## 2013-10-29 DIAGNOSIS — E119 Type 2 diabetes mellitus without complications: Secondary | ICD-10-CM | POA: Diagnosis not present

## 2013-11-05 DIAGNOSIS — I10 Essential (primary) hypertension: Secondary | ICD-10-CM | POA: Diagnosis not present

## 2013-11-05 DIAGNOSIS — E119 Type 2 diabetes mellitus without complications: Secondary | ICD-10-CM | POA: Diagnosis not present

## 2013-11-05 DIAGNOSIS — B354 Tinea corporis: Secondary | ICD-10-CM | POA: Diagnosis not present

## 2013-11-05 DIAGNOSIS — N390 Urinary tract infection, site not specified: Secondary | ICD-10-CM | POA: Diagnosis not present

## 2013-11-05 DIAGNOSIS — R32 Unspecified urinary incontinence: Secondary | ICD-10-CM | POA: Diagnosis not present

## 2013-11-09 ENCOUNTER — Emergency Department (HOSPITAL_COMMUNITY)
Admission: EM | Admit: 2013-11-09 | Discharge: 2013-11-09 | Disposition: A | Discharge status: Home / Self Care | Payer: Medicare Other | Attending: Emergency Medicine | Admitting: Emergency Medicine

## 2013-11-09 ENCOUNTER — Emergency Department (HOSPITAL_COMMUNITY): Payer: Medicare Other

## 2013-11-09 ENCOUNTER — Encounter (HOSPITAL_COMMUNITY): Payer: Self-pay | Admitting: Emergency Medicine

## 2013-11-09 DIAGNOSIS — S1093XA Contusion of unspecified part of neck, initial encounter: Secondary | ICD-10-CM

## 2013-11-09 DIAGNOSIS — Z79899 Other long term (current) drug therapy: Secondary | ICD-10-CM | POA: Diagnosis not present

## 2013-11-09 DIAGNOSIS — K219 Gastro-esophageal reflux disease without esophagitis: Secondary | ICD-10-CM | POA: Insufficient documentation

## 2013-11-09 DIAGNOSIS — S8990XA Unspecified injury of unspecified lower leg, initial encounter: Secondary | ICD-10-CM | POA: Diagnosis not present

## 2013-11-09 DIAGNOSIS — F411 Generalized anxiety disorder: Secondary | ICD-10-CM | POA: Diagnosis not present

## 2013-11-09 DIAGNOSIS — S0993XA Unspecified injury of face, initial encounter: Secondary | ICD-10-CM | POA: Insufficient documentation

## 2013-11-09 DIAGNOSIS — Z8669 Personal history of other diseases of the nervous system and sense organs: Secondary | ICD-10-CM | POA: Insufficient documentation

## 2013-11-09 DIAGNOSIS — S99919A Unspecified injury of unspecified ankle, initial encounter: Secondary | ICD-10-CM

## 2013-11-09 DIAGNOSIS — S0003XA Contusion of scalp, initial encounter: Secondary | ICD-10-CM | POA: Insufficient documentation

## 2013-11-09 DIAGNOSIS — R110 Nausea: Secondary | ICD-10-CM | POA: Diagnosis not present

## 2013-11-09 DIAGNOSIS — M542 Cervicalgia: Secondary | ICD-10-CM | POA: Diagnosis not present

## 2013-11-09 DIAGNOSIS — S0083XA Contusion of other part of head, initial encounter: Secondary | ICD-10-CM | POA: Insufficient documentation

## 2013-11-09 DIAGNOSIS — W050XXA Fall from non-moving wheelchair, initial encounter: Secondary | ICD-10-CM | POA: Insufficient documentation

## 2013-11-09 DIAGNOSIS — F329 Major depressive disorder, single episode, unspecified: Secondary | ICD-10-CM | POA: Insufficient documentation

## 2013-11-09 DIAGNOSIS — M1990 Unspecified osteoarthritis, unspecified site: Secondary | ICD-10-CM | POA: Insufficient documentation

## 2013-11-09 DIAGNOSIS — Y9389 Activity, other specified: Secondary | ICD-10-CM | POA: Diagnosis not present

## 2013-11-09 DIAGNOSIS — W19XXXA Unspecified fall, initial encounter: Secondary | ICD-10-CM

## 2013-11-09 DIAGNOSIS — M25569 Pain in unspecified knee: Secondary | ICD-10-CM | POA: Diagnosis not present

## 2013-11-09 DIAGNOSIS — S199XXA Unspecified injury of neck, initial encounter: Secondary | ICD-10-CM | POA: Diagnosis not present

## 2013-11-09 DIAGNOSIS — I10 Essential (primary) hypertension: Secondary | ICD-10-CM | POA: Insufficient documentation

## 2013-11-09 DIAGNOSIS — S0990XA Unspecified injury of head, initial encounter: Secondary | ICD-10-CM | POA: Diagnosis not present

## 2013-11-09 DIAGNOSIS — F3289 Other specified depressive episodes: Secondary | ICD-10-CM | POA: Insufficient documentation

## 2013-11-09 DIAGNOSIS — M25561 Pain in right knee: Secondary | ICD-10-CM

## 2013-11-09 DIAGNOSIS — E119 Type 2 diabetes mellitus without complications: Secondary | ICD-10-CM | POA: Diagnosis not present

## 2013-11-09 DIAGNOSIS — Z7982 Long term (current) use of aspirin: Secondary | ICD-10-CM | POA: Insufficient documentation

## 2013-11-09 DIAGNOSIS — R51 Headache: Secondary | ICD-10-CM | POA: Diagnosis not present

## 2013-11-09 DIAGNOSIS — W1809XA Striking against other object with subsequent fall, initial encounter: Secondary | ICD-10-CM | POA: Diagnosis not present

## 2013-11-09 DIAGNOSIS — T1490XA Injury, unspecified, initial encounter: Secondary | ICD-10-CM | POA: Diagnosis not present

## 2013-11-09 DIAGNOSIS — S99929A Unspecified injury of unspecified foot, initial encounter: Secondary | ICD-10-CM

## 2013-11-09 DIAGNOSIS — R1110 Vomiting, unspecified: Secondary | ICD-10-CM | POA: Diagnosis not present

## 2013-11-09 DIAGNOSIS — Y929 Unspecified place or not applicable: Secondary | ICD-10-CM | POA: Diagnosis not present

## 2013-11-09 MED ORDER — ACETAMINOPHEN 325 MG PO TABS
650.0000 mg | ORAL_TABLET | Freq: Once | ORAL | Status: AC
  Administered 2013-11-09: 650 mg via ORAL
  Filled 2013-11-09: qty 2

## 2013-11-09 NOTE — ED Notes (Signed)
Pt BIB EMS. Pt is from [facility]. Pt fell out of her wheelchair and hit her head and knees while trying to bend over and pick up an object. Pt denies LOC, nausea. Pt has abrasion on R knee and small hematoma over L eye. Pt has hx of R knee replacement.

## 2013-11-09 NOTE — ED Provider Notes (Signed)
CSN: [id number]     Arrival date & time 11/09/13  1619 History   First MD Initiated Contact with Patient 11/09/13 1628     Chief Complaint  Patient presents with  . Fall  . Knee Injury  . Head Injury      HPI Patient was in her wheelchair at her nursing facility.  She leaned forward to grab something off the ground and fell forward.  She injured her right knee and has a contusion to her left forehead.  No loss consciousness.  No use of anticoagulants.  She reports mild neck pain as well.  She denies weakness in arms or legs.  No numbness or tingling or weakness in her hands.  Denies chest pain or abdominal pain.  No nausea or vomiting.  Baseline mental status per the family.   Past Medical History  Diagnosis Date  . Anxiety   . Dyspnea   . Diabetes mellitus   . Osteoarthritis   . Hypercholesteremia   . Depression   . RLS (restless legs syndrome)   . Stress   . PONV (postoperative nausea and vomiting)   . Headache(784.0)   . HTN (hypertension)     dr Johnsie Cancel   Past Surgical History  Procedure Laterality Date  . Colonoscopy    . Abdominal hysterectomy    . Lumbar laminectomy/decompression microdiscectomy N/A 04/18/2012    Procedure: LUMBAR LAMINECTOMY/DECOMPRESSION MICRODISCECTOMY 3 LEVELS;  Surgeon: Winfield Cunas, MD;  Location: [facility];  Service: Neurosurgery;  Laterality: N/A;  Lumbar three-four, lumbar four-five, lumbar five-sacral one decompression. Synovial cyst resection lumbar four-five  . Joint replacement    . Left forearm fracture with orif Left    Family History  Problem Relation Age of Onset  . Diabetes Father   . High blood pressure Father   . High blood pressure Mother    History  Substance Use Topics  . Smoking status: Never Smoker   . Smokeless tobacco: Never Used  . Alcohol Use: No   OB History   Grav Para Term Preterm Abortions TAB SAB Ect Mult Living                 Review of Systems  All other systems reviewed and are  negative.     Allergies  Morphine and related; Oxycodone; and Sulfonamide derivatives  Home Medications   Prior to Admission medications   Medication Sig Start Date End Date Taking? Authorizing Provider  acetaminophen (TYLENOL) 500 MG tablet Take 1 tablet (500 mg total) by mouth every 6 (six) hours as needed. 07/26/13   [name], MD  ALPRAZolam Duanne Moron) 0.5 MG tablet Take 0.5 mg by mouth at bedtime.     Historical Provider, MD  aspirin EC 81 MG tablet Take 81 mg by mouth daily.    Historical Provider, MD  B Complex Vitamins (B-COMPLEX/B-12 PO) Take by mouth daily. Once a week.    Historical Provider, MD  bisacodyl (DULCOLAX) 10 MG suppository Place 10 mg rectally as needed for moderate constipation.    Historical Provider, MD  gabapentin (NEURONTIN) 300 MG capsule Take 300 mg by mouth See admin instructions. Take one capsule in the am and two capsules at night for restless leg.    Historical Provider, MD  glipiZIDE (GLUCOTROL XL) 5 MG 24 hr tablet Take 5 mg by mouth 2 (two) times daily.     Historical Provider, MD  HYDROcodone-acetaminophen (NORCO/VICODIN) 5-325 MG per tablet Take 1 tablet by mouth every 6 (six) hours as  needed for moderate pain (pain).    Historical Provider, MD  losartan (COZAAR) 100 MG tablet Take 100 mg by mouth daily.    Historical Provider, MD  magnesium hydroxide (MILK OF MAGNESIA) 400 MG/5ML suspension Take 30 mLs by mouth daily as needed for mild constipation or moderate constipation. 07/26/13   [name], MD  metFORMIN (GLUCOPHAGE) 500 MG tablet Take 500 mg by mouth 2 (two) times daily with a meal.    Historical Provider, MD  metoCLOPramide (REGLAN) 10 MG tablet Take 10 mg by mouth See admin instructions. Give one tablet by mouth before meals and at bedtime for nausea and vomitting. Give 30 min befre meals.    Historical Provider, MD  metoprolol (TOPROL-XL) 100 MG 24 hr tablet Take 100 mg by mouth daily.      Historical Provider, MD  nitrofurantoin,  macrocrystal-monohydrate, (MACROBID) 100 MG capsule Take 1 capsule (100 mg total) by mouth 2 (two) times daily. X 7 days 09/04/13   Ephraim Hamburger, MD  ondansetron (ZOFRAN-ODT) 4 MG disintegrating tablet Take 4 mg by mouth every 8 (eight) hours as needed for nausea or vomiting.    Historical Provider, MD  pantoprazole (PROTONIX) 40 MG tablet Take 40 mg by mouth daily.    Historical Provider, MD  polyethylene glycol (MIRALAX / GLYCOLAX) packet Take 17 g by mouth daily. 07/26/13   [name], MD  potassium chloride SA (K-DUR,KLOR-CON) 20 MEQ tablet Take 40 mEq by mouth daily.     Historical Provider, MD  pramipexole (MIRAPEX) 0.25 MG tablet Take 0.25 mg by mouth 2 (two) times daily. Morning and night.    Historical Provider, MD  senna-docusate (SENOKOT-S) 8.6-50 MG per tablet Take 1 tablet by mouth at bedtime as needed for mild constipation. 07/26/13   [name], MD  sertraline (ZOLOFT) 50 MG tablet Take 50 mg by mouth daily.    Historical Provider, MD  traMADol (ULTRAM) 50 MG tablet Take 1 tablet (50 mg total) by mouth every 6 (six) hours as needed for moderate pain or severe pain. 07/26/13   [name], MD  verapamil (CALAN-SR) 240 MG CR tablet Take 1 tablet (240 mg total) by mouth daily. 07/26/13   [name], MD   BP 131/86  Pulse 69  Temp(Src) 98.5 F (36.9 C) (Oral)  Resp 14  SpO2 96% Physical Exam  Nursing note and vitals reviewed. Constitutional: She is oriented to person, place, and time. She appears well-developed and well-nourished. No distress.  HENT:  Small hematoma left forehead  Eyes: EOM are normal.  Neck: Neck supple.  Mild cervical spine tenderness without cervical step-offs  Cardiovascular: Normal rate, regular rhythm and normal heart sounds.   Pulmonary/Chest: Effort normal and breath sounds normal.  Abdominal: Soft. She exhibits no distension. There is no tenderness.  Musculoskeletal: Normal range of motion.  Full range of motion bilateral knees and hips.   Mild small abrasion and bruise over the right patella.  5 out of 5 strength in bilateral upper and lower extremity major muscle groups  Neurological: She is alert and oriented to person, place, and time.  Skin: Skin is warm and dry.  Psychiatric: She has a normal mood and affect. Judgment normal.    ED Course  Procedures (including critical care time) Labs Review Labs Reviewed - No data to display  Imaging Review Ct Head Wo Contrast  11/09/2013   CLINICAL DATA:  Status post fall.  EXAM: CT HEAD WITHOUT CONTRAST  CT NECK WITHOUT CONTRAST  TECHNIQUE: Contiguous  axial images were obtained from the base of the skull through the vertex without contrast. Multidetector CT imaging of the neck was performed using the standard protocol without intravenous contrast.  COMPARISON:  Brain CT 09/04/2013.  FINDINGS: CT HEAD FINDINGS  Ventricles and sulci are prominent compatible with atrophy. No evidence for acute cortically based infarct, hemorrhage, mass effect or mass lesion. Periventricular and subcortical white matter hypodensity compatible with chronic small vessel ischemic change. Calvarium is intact. Small amount of soft tissue thickening and hematoma within the soft tissues overlying the left frontal bone. The orbits are unremarkable. Mastoid air cells and paranasal sinuses are unremarkable.  CT NECK FINDINGS  Relative straightening of the normal cervical lordosis. No evidence for acute cervical spine fracture. Multilevel degenerative disc disease most pronounced at C4-5 and C5-6. Mild multilevel facet degenerative change most pronounced at C6-7. Craniocervical junction is unremarkable.  IMPRESSION: No acute intracranial process.  No acute cervical spine fracture.  Chronic small vessel ischemic change.   Electronically Signed   By: Lovey Newcomer M.D.   On: 11/09/2013 17:44   Ct Cervical Spine Wo Contrast  11/09/2013   CLINICAL DATA:  Status post fall.  EXAM: CT HEAD WITHOUT CONTRAST  CT NECK WITHOUT CONTRAST   TECHNIQUE: Contiguous axial images were obtained from the base of the skull through the vertex without contrast. Multidetector CT imaging of the neck was performed using the standard protocol without intravenous contrast.  COMPARISON:  Brain CT 09/04/2013.  FINDINGS: CT HEAD FINDINGS  Ventricles and sulci are prominent compatible with atrophy. No evidence for acute cortically based infarct, hemorrhage, mass effect or mass lesion. Periventricular and subcortical white matter hypodensity compatible with chronic small vessel ischemic change. Calvarium is intact. Small amount of soft tissue thickening and hematoma within the soft tissues overlying the left frontal bone. The orbits are unremarkable. Mastoid air cells and paranasal sinuses are unremarkable.  CT NECK FINDINGS  Relative straightening of the normal cervical lordosis. No evidence for acute cervical spine fracture. Multilevel degenerative disc disease most pronounced at C4-5 and C5-6. Mild multilevel facet degenerative change most pronounced at C6-7. Craniocervical junction is unremarkable.  IMPRESSION: No acute intracranial process.  No acute cervical spine fracture.  Chronic small vessel ischemic change.   Electronically Signed   By: Lovey Newcomer M.D.   On: 11/09/2013 17:44   Dg Knee Complete 4 Views Right  11/09/2013   CLINICAL DATA:  Fall today.  Right knee pain.  EXAM: RIGHT KNEE - COMPLETE 4+ VIEW  COMPARISON:  None.  FINDINGS: No acute fracture. Right knee prosthetic components are well-seated and aligned without evidence of loosening. Bones are demineralized. No joint effusion. Soft tissues are unremarkable.  IMPRESSION: No fracture or dislocation. No evidence of loosening of the prosthetic components.   Electronically Signed   By: Lajean Manes M.D.   On: 11/09/2013 17:18     EKG Interpretation None      MDM   Final diagnoses:  Fall, initial encounter  Minor head injury, initial encounter  Right knee pain    Contusion.  Imaging  negative for acute fracture.  Discharge home in good condition.  Baseline mental status per family    Hoy Morn, MD 11/09/13 1754

## 2013-11-09 NOTE — ED Notes (Signed)
[facility] here to get pt  

## 2013-11-09 NOTE — ED Notes (Signed)
Patient returned from CT

## 2013-11-09 NOTE — ED Notes (Signed)
Bed: WA04 Expected date:  Expected time:  Means of arrival:  Comments: fall 

## 2013-11-12 DIAGNOSIS — S0990XA Unspecified injury of head, initial encounter: Secondary | ICD-10-CM | POA: Diagnosis not present

## 2013-11-12 DIAGNOSIS — E119 Type 2 diabetes mellitus without complications: Secondary | ICD-10-CM | POA: Diagnosis not present

## 2013-11-12 DIAGNOSIS — I10 Essential (primary) hypertension: Secondary | ICD-10-CM | POA: Diagnosis not present

## 2013-11-16 DIAGNOSIS — I10 Essential (primary) hypertension: Secondary | ICD-10-CM | POA: Diagnosis not present

## 2013-11-16 DIAGNOSIS — E782 Mixed hyperlipidemia: Secondary | ICD-10-CM | POA: Diagnosis not present

## 2013-11-16 DIAGNOSIS — E119 Type 2 diabetes mellitus without complications: Secondary | ICD-10-CM | POA: Diagnosis not present

## 2013-11-16 DIAGNOSIS — Z5189 Encounter for other specified aftercare: Secondary | ICD-10-CM | POA: Diagnosis not present

## 2013-11-19 DIAGNOSIS — Z5189 Encounter for other specified aftercare: Secondary | ICD-10-CM | POA: Diagnosis not present

## 2013-11-19 DIAGNOSIS — E119 Type 2 diabetes mellitus without complications: Secondary | ICD-10-CM | POA: Diagnosis not present

## 2013-11-21 DIAGNOSIS — E119 Type 2 diabetes mellitus without complications: Secondary | ICD-10-CM | POA: Diagnosis not present

## 2013-11-21 DIAGNOSIS — Z5189 Encounter for other specified aftercare: Secondary | ICD-10-CM | POA: Diagnosis not present

## 2013-11-26 DIAGNOSIS — Z9181 History of falling: Secondary | ICD-10-CM | POA: Diagnosis not present

## 2013-11-26 DIAGNOSIS — Z5189 Encounter for other specified aftercare: Secondary | ICD-10-CM | POA: Diagnosis not present

## 2013-11-26 DIAGNOSIS — I10 Essential (primary) hypertension: Secondary | ICD-10-CM | POA: Diagnosis not present

## 2013-11-26 DIAGNOSIS — E119 Type 2 diabetes mellitus without complications: Secondary | ICD-10-CM | POA: Diagnosis not present

## 2013-11-27 DIAGNOSIS — E559 Vitamin D deficiency, unspecified: Secondary | ICD-10-CM | POA: Diagnosis not present

## 2013-11-28 DIAGNOSIS — E119 Type 2 diabetes mellitus without complications: Secondary | ICD-10-CM | POA: Diagnosis not present

## 2013-11-28 DIAGNOSIS — Z5189 Encounter for other specified aftercare: Secondary | ICD-10-CM | POA: Diagnosis not present

## 2013-12-02 DIAGNOSIS — E119 Type 2 diabetes mellitus without complications: Secondary | ICD-10-CM | POA: Diagnosis not present

## 2013-12-02 DIAGNOSIS — Z5189 Encounter for other specified aftercare: Secondary | ICD-10-CM | POA: Diagnosis not present

## 2013-12-03 DIAGNOSIS — E119 Type 2 diabetes mellitus without complications: Secondary | ICD-10-CM | POA: Diagnosis not present

## 2013-12-03 DIAGNOSIS — Z5189 Encounter for other specified aftercare: Secondary | ICD-10-CM | POA: Diagnosis not present

## 2013-12-05 DIAGNOSIS — E119 Type 2 diabetes mellitus without complications: Secondary | ICD-10-CM | POA: Diagnosis not present

## 2013-12-05 DIAGNOSIS — Z5189 Encounter for other specified aftercare: Secondary | ICD-10-CM | POA: Diagnosis not present

## 2013-12-10 DIAGNOSIS — E119 Type 2 diabetes mellitus without complications: Secondary | ICD-10-CM | POA: Diagnosis not present

## 2013-12-10 DIAGNOSIS — Z5189 Encounter for other specified aftercare: Secondary | ICD-10-CM | POA: Diagnosis not present

## 2013-12-12 DIAGNOSIS — R69 Illness, unspecified: Secondary | ICD-10-CM | POA: Diagnosis not present

## 2013-12-12 DIAGNOSIS — E119 Type 2 diabetes mellitus without complications: Secondary | ICD-10-CM | POA: Diagnosis not present

## 2013-12-12 DIAGNOSIS — Z5189 Encounter for other specified aftercare: Secondary | ICD-10-CM | POA: Diagnosis not present

## 2013-12-13 ENCOUNTER — Emergency Department (HOSPITAL_COMMUNITY)
Admission: EM | Admit: 2013-12-13 | Discharge: 2013-12-13 | Disposition: A | Discharge status: Home / Self Care | Payer: Medicare Other | Attending: Emergency Medicine | Admitting: Emergency Medicine

## 2013-12-13 ENCOUNTER — Emergency Department (HOSPITAL_COMMUNITY): Payer: Medicare Other

## 2013-12-13 ENCOUNTER — Encounter (HOSPITAL_COMMUNITY): Payer: Self-pay | Admitting: Emergency Medicine

## 2013-12-13 DIAGNOSIS — I10 Essential (primary) hypertension: Secondary | ICD-10-CM | POA: Insufficient documentation

## 2013-12-13 DIAGNOSIS — Z79899 Other long term (current) drug therapy: Secondary | ICD-10-CM | POA: Insufficient documentation

## 2013-12-13 DIAGNOSIS — M545 Low back pain: Secondary | ICD-10-CM | POA: Diagnosis not present

## 2013-12-13 DIAGNOSIS — Z043 Encounter for examination and observation following other accident: Secondary | ICD-10-CM | POA: Diagnosis not present

## 2013-12-13 DIAGNOSIS — F419 Anxiety disorder, unspecified: Secondary | ICD-10-CM | POA: Diagnosis not present

## 2013-12-13 DIAGNOSIS — Y929 Unspecified place or not applicable: Secondary | ICD-10-CM | POA: Diagnosis not present

## 2013-12-13 DIAGNOSIS — E119 Type 2 diabetes mellitus without complications: Secondary | ICD-10-CM | POA: Diagnosis not present

## 2013-12-13 DIAGNOSIS — Z8669 Personal history of other diseases of the nervous system and sense organs: Secondary | ICD-10-CM | POA: Insufficient documentation

## 2013-12-13 DIAGNOSIS — Y939 Activity, unspecified: Secondary | ICD-10-CM | POA: Insufficient documentation

## 2013-12-13 DIAGNOSIS — W06XXXA Fall from bed, initial encounter: Secondary | ICD-10-CM | POA: Insufficient documentation

## 2013-12-13 DIAGNOSIS — I6782 Cerebral ischemia: Secondary | ICD-10-CM | POA: Insufficient documentation

## 2013-12-13 DIAGNOSIS — F329 Major depressive disorder, single episode, unspecified: Secondary | ICD-10-CM | POA: Insufficient documentation

## 2013-12-13 DIAGNOSIS — M1990 Unspecified osteoarthritis, unspecified site: Secondary | ICD-10-CM | POA: Diagnosis not present

## 2013-12-13 DIAGNOSIS — R259 Unspecified abnormal involuntary movements: Secondary | ICD-10-CM | POA: Diagnosis not present

## 2013-12-13 DIAGNOSIS — S0990XA Unspecified injury of head, initial encounter: Secondary | ICD-10-CM | POA: Diagnosis not present

## 2013-12-13 DIAGNOSIS — Z0000 Encounter for general adult medical examination without abnormal findings: Secondary | ICD-10-CM | POA: Diagnosis not present

## 2013-12-13 DIAGNOSIS — Z7982 Long term (current) use of aspirin: Secondary | ICD-10-CM | POA: Diagnosis not present

## 2013-12-13 DIAGNOSIS — S199XXA Unspecified injury of neck, initial encounter: Secondary | ICD-10-CM | POA: Diagnosis not present

## 2013-12-13 DIAGNOSIS — S29002A Unspecified injury of muscle and tendon of back wall of thorax, initial encounter: Secondary | ICD-10-CM | POA: Diagnosis not present

## 2013-12-13 NOTE — Discharge Instructions (Signed)
Read the information below.  You may return to the Emergency Department at any time for worsening condition or any new symptoms that concern you.    Please speak with the staff at your facility to help with your safety.  You may consider installing bedrails, for example, or have a bell/buzzer to ring for assistance when you need to get up.     Fall Prevention in Hospitals As a hospital patient, your condition and the treatments you receive can increase your risk for falls. Some additional risk factors for falls in a hospital include:  Being in an unfamiliar environment.  Being on bed rest.  Your surgery.  Taking certain medicines.  Your tubing requirements, such as intravenous (IV) therapy or catheters. It is important that you learn how to decrease fall risks while at the hospital. Below are important tips that can help prevent falls. SAFETY TIPS FOR PREVENTING FALLS Talk about your risk of falling.  Ask your caregiver why you are at risk for falling. Is it your medicine, illness, tubing placement, or something else?  Make a plan with your caregiver to keep you safe from falls.  Ask your caregiver or pharmacist about side effect of your medicines. Some medicines can make you dizzy or affect your coordination. Ask for help.  Ask for help before getting out of bed. You may need to press your call button.  Ask for assistance in getting you safely to the toilet.  Ask for a walker or cane to be put at your bedside. Ask that most of the side rails on your bed be placed up before your caregiver leaves the room.  Ask family or friends to sit with you.  Ask for things that are out of your reach, such as your glasses, hearing aids, telephone, bedside table, or call button. Follow these tips to avoid falling:  Stay lying or seated, rather than standing, while waiting for help.  Wear rubber-soled slippers or shoes whenever you walk in the hospital.  Avoid quick, sudden  movements.  Change positions slowly.  Sit on the side of your bed before standing.  Stand up slowly and wait before you start to walk.  Let your caregiver know if there is a spill on the floor.  Pay careful attention to the medical equipment, electrical cords, and tubes around you.  When you need help, use your call button by your bed or in the bathroom. Wait for one of your caregivers to help you.  If you feel dizzy or unsure of your footing, return to bed and wait for assistance.  Avoid being distracted by the TV, telephone, or another person in your room.  Do not lean or support yourself on rolling objects, such as IV poles or bedside tables. Document Released: 02/05/2000 Document Revised: 01/25/2012 Document Reviewed: 10/16/2011 [facility] Patient Information 2015 [place], [place]. This information is not intended to replace advice given to you by your health care provider. Make sure you discuss any questions you have with your health care provider.  Fall Prevention and Home Safety Falls cause injuries and can affect all age groups. It is possible to prevent falls.  HOW TO PREVENT FALLS  Wear shoes with rubber soles that do not have an opening for your toes.  Keep the inside and outside of your house well lit.  Use night lights throughout your home.  Remove clutter from floors.  Clean up floor spills.  Remove throw rugs or fasten them to the floor with carpet tape.  Do not place electrical cords across pathways.  Put grab bars by your tub, shower, and toilet. Do not use towel bars as grab bars.  Put handrails on both sides of the stairway. Fix loose handrails.  Do not climb on stools or stepladders, if possible.  Do not wax your floors.  Repair uneven or unsafe sidewalks, walkways, or stairs.  Keep items you use a lot within reach.  Be aware of pets.  Keep emergency numbers next to the telephone.  Put smoke detectors in your home and near bedrooms. Ask your  doctor what other things you can do to prevent falls. Document Released: 12/04/2008 Document Revised: 08/09/2011 Document Reviewed: 05/10/2011 [facility] Patient Information 2015 [place], [place]. This information is not intended to replace advice given to you by your health care provider. Make sure you discuss any questions you have with your health care provider.

## 2013-12-13 NOTE — ED Provider Notes (Signed)
73 year old female who apparently fell out of bed at home. She denies any injury and daughter who is with her states that her mental status is at baseline. On exam, there is no obvious injury. She is being sent for CT scans.  Medical screening examination/treatment/procedure(s) were conducted as a shared visit with non-physician practitioner(s) and myself.  I personally evaluated the patient during the encounter.    Delora Fuel, MD 00/34/91 [id number]

## 2013-12-13 NOTE — ED Notes (Signed)
Per EMS pt from [facility], pt had unwitnessed fall out of bed, fell onto bedding sheets and pillows, no complaints, denies pain. Pt a/o x 3, pt had R knee replacement so that is why she is at [facility].

## 2013-12-13 NOTE — ED Notes (Signed)
[facility] called to transport pt back [facility]

## 2013-12-13 NOTE — ED Notes (Signed)
Pt states she slid out of bed tonight, denies any injury or pain, pt in no distress, resting in bed.

## 2013-12-13 NOTE — ED Provider Notes (Signed)
CSN: [id number]     Arrival date & time 12/13/13  0012 History   First MD Initiated Contact with Patient 12/13/13 0029     Chief Complaint  Patient presents with  . Fall     (Consider location/radiation/quality/duration/timing/severity/associated sxs/prior Treatment) HPI  Patient presents with fall from bed.  Pt states she fell out of bed while she was asleep, landed on the ground, called for help but noone came so she decided to go back to sleep on the ground.  States she fell around 9:30pm. She was found around 11:00 and family was called.  Denies any pain anywhere.  Denies confusion, focal neurologic deficits.  Per family member pt seems sleepy but otherwise normal.    Past Medical History  Diagnosis Date  . Anxiety   . Dyspnea   . Diabetes mellitus   . Osteoarthritis   . Hypercholesteremia   . Depression   . RLS (restless legs syndrome)   . Stress   . PONV (postoperative nausea and vomiting)   . Headache(784.0)   . HTN (hypertension)     dr Johnsie Cancel   Past Surgical History  Procedure Laterality Date  . Colonoscopy    . Abdominal hysterectomy    . Lumbar laminectomy/decompression microdiscectomy N/A 04/18/2012    Procedure: LUMBAR LAMINECTOMY/DECOMPRESSION MICRODISCECTOMY 3 LEVELS;  Surgeon: Winfield Cunas, MD;  Location: [facility];  Service: Neurosurgery;  Laterality: N/A;  Lumbar three-four, lumbar four-five, lumbar five-sacral one decompression. Synovial cyst resection lumbar four-five  . Joint replacement    . Left forearm fracture with orif Left   . Replacement total knee      right   Family History  Problem Relation Age of Onset  . Diabetes Father   . High blood pressure Father   . High blood pressure Mother    History  Substance Use Topics  . Smoking status: Never Smoker   . Smokeless tobacco: Never Used  . Alcohol Use: No   OB History   Grav Para Term Preterm Abortions TAB SAB Ect Mult Living                 Review of Systems  All other systems  reviewed and are negative.     Allergies  Morphine and related; Oxycodone; and Sulfonamide derivatives  Home Medications   Prior to Admission medications   Medication Sig Start Date End Date Taking? Authorizing Provider  acetaminophen (TYLENOL) 500 MG tablet Take 500 mg by mouth every 6 (six) hours as needed for mild pain.   Yes Historical Provider, MD  ALPRAZolam Duanne Moron) 0.5 MG tablet Take 0.5 mg by mouth at bedtime.    Yes Historical Provider, MD  aspirin EC 81 MG tablet Take 81 mg by mouth daily.   Yes Historical Provider, MD  B Complex Vitamins (B-COMPLEX/B-12 PO) Take by mouth daily. Once a week.   Yes Historical Provider, MD  bisacodyl (DULCOLAX) 10 MG suppository Place 10 mg rectally as needed for moderate constipation.   Yes Historical Provider, MD  glipiZIDE (GLUCOTROL XL) 5 MG 24 hr tablet Take 5 mg by mouth daily with breakfast.    Yes Historical Provider, MD  HYDROcodone-acetaminophen (NORCO/VICODIN) 5-325 MG per tablet Take 1 tablet by mouth every 6 (six) hours as needed for moderate pain (pain).   Yes Historical Provider, MD  losartan (COZAAR) 100 MG tablet Take 100 mg by mouth daily.   Yes Historical Provider, MD  magnesium hydroxide (MILK OF MAGNESIA) 400 MG/5ML suspension Take 30 mLs by  mouth daily as needed for mild constipation or moderate constipation. 07/26/13  Yes [name], MD  metFORMIN (GLUCOPHAGE) 500 MG tablet Take 500 mg by mouth 2 (two) times daily with a meal.   Yes Historical Provider, MD  metoCLOPramide (REGLAN) 10 MG tablet Take 10 mg by mouth See admin instructions. Give one tablet by mouth before meals and at bedtime for nausea and vomitting. Give 30 min befre meals.   Yes Historical Provider, MD  metoprolol (TOPROL-XL) 100 MG 24 hr tablet Take 100 mg by mouth daily.     Yes Historical Provider, MD  omeprazole (PRILOSEC) 40 MG capsule Take 40 mg by mouth daily.   Yes Historical Provider, MD  ondansetron (ZOFRAN) 4 MG tablet Take 4 mg by mouth every 8  (eight) hours as needed for nausea or vomiting.   Yes Historical Provider, MD  polyethylene glycol (MIRALAX / GLYCOLAX) packet Take 17 g by mouth daily. 07/26/13  Yes [name], MD  potassium chloride SA (K-DUR,KLOR-CON) 20 MEQ tablet Take 40 mEq by mouth daily.    Yes Historical Provider, MD  pramipexole (MIRAPEX) 0.25 MG tablet Take 0.375 mg by mouth at bedtime. Morning and night.   Yes Historical Provider, MD  senna-docusate (SENOKOT-S) 8.6-50 MG per tablet Take 1 tablet by mouth at bedtime as needed for mild constipation. 07/26/13  Yes [name], MD  sertraline (ZOLOFT) 50 MG tablet Take 50 mg by mouth daily.   Yes Historical Provider, MD  verapamil (CALAN-SR) 240 MG CR tablet Take 1 tablet (240 mg total) by mouth daily. 07/26/13  Yes [name], MD   BP 136/74  Pulse 77  Temp(Src) 98.5 F (36.9 C) (Oral)  Resp 16  SpO2 94% Physical Exam  Nursing note and vitals reviewed. Constitutional: She appears well-developed and well-nourished. No distress.  HENT:  Head: Normocephalic and atraumatic.  Neck: Neck supple.  Cardiovascular: Normal rate, regular rhythm and intact distal pulses.   Pulmonary/Chest: Effort normal and breath sounds normal. No respiratory distress. She has no wheezes. She has no rales.  Abdominal: Soft. She exhibits no distension. There is no tenderness. There is no rebound and no guarding.  Musculoskeletal: She exhibits no edema and no tenderness.  Spine nontender, no crepitus, or stepoffs.   Neurological: She is alert. She has normal strength. No sensory deficit. She exhibits normal muscle tone. GCS eye subscore is 4. GCS verbal subscore is 5. GCS motor subscore is 6.  Oriented to self and location, reason for being here.  Knows the day of the week, the year, and the president.  States it is September (it is October)  Doesn't know the date.    Skin: She is not diaphoretic.    ED Course  Procedures (including critical care time) Labs Review Labs Reviewed  - No data to display  Imaging Review Ct Head Wo Contrast  12/13/2013   CLINICAL DATA:  [place] out of bed tonight.  Unwitnessed.  EXAM: CT HEAD WITHOUT CONTRAST  CT CERVICAL SPINE WITHOUT CONTRAST  TECHNIQUE: Multidetector CT imaging of the head and cervical spine was performed following the standard protocol without intravenous contrast. Multiplanar CT image reconstructions of the cervical spine were also generated.  COMPARISON:  CT head and cervical spine 11/09/2013.  FINDINGS: CT HEAD FINDINGS  Diffuse cerebral atrophy. Ventricular dilatation likely represents central atrophy. Low-attenuation change in the deep white matter consistent with small vessel ischemia. No mass effect or midline shift. No abnormal extra-axial fluid collections. Gray-white matter junctions are distinct. Basal cisterns  are not effaced. No evidence of acute intracranial hemorrhage. No depressed skull fractures. Mild mucosal thickening in the paranasal sinuses. Mastoid air cells are not opacified. Vascular calcifications.  CT CERVICAL SPINE FINDINGS  Straightening of the usual cervical lordosis. No anterior subluxation. Normal alignment of the cervical facet joints. Degenerative changes in the cervical spine with disc space narrowing and prominent hypertrophic changes at the C4-5, C5-6, and C6-7 levels. Degenerative changes at C1-2. C1-2 articulation appears intact. No vertebral compression deformities. No prevertebral soft tissue swelling. No focal bone lesion or bone destruction. Bone cortex and trabecular architecture appear intact. Soft tissues are unremarkable.  IMPRESSION: No acute intracranial abnormalities. Chronic atrophy and small vessel ischemic changes.  Straightening of the usual cervical lordosis. Degenerative changes in the cervical spine. No displaced fractures identified.   Electronically Signed   By: Lucienne Capers M.D.   On: 12/13/2013 02:41   Ct Cervical Spine Wo Contrast  12/13/2013   CLINICAL DATA:  [place] out of  bed tonight.  Unwitnessed.  EXAM: CT HEAD WITHOUT CONTRAST  CT CERVICAL SPINE WITHOUT CONTRAST  TECHNIQUE: Multidetector CT imaging of the head and cervical spine was performed following the standard protocol without intravenous contrast. Multiplanar CT image reconstructions of the cervical spine were also generated.  COMPARISON:  CT head and cervical spine 11/09/2013.  FINDINGS: CT HEAD FINDINGS  Diffuse cerebral atrophy. Ventricular dilatation likely represents central atrophy. Low-attenuation change in the deep white matter consistent with small vessel ischemia. No mass effect or midline shift. No abnormal extra-axial fluid collections. Gray-white matter junctions are distinct. Basal cisterns are not effaced. No evidence of acute intracranial hemorrhage. No depressed skull fractures. Mild mucosal thickening in the paranasal sinuses. Mastoid air cells are not opacified. Vascular calcifications.  CT CERVICAL SPINE FINDINGS  Straightening of the usual cervical lordosis. No anterior subluxation. Normal alignment of the cervical facet joints. Degenerative changes in the cervical spine with disc space narrowing and prominent hypertrophic changes at the C4-5, C5-6, and C6-7 levels. Degenerative changes at C1-2. C1-2 articulation appears intact. No vertebral compression deformities. No prevertebral soft tissue swelling. No focal bone lesion or bone destruction. Bone cortex and trabecular architecture appear intact. Soft tissues are unremarkable.  IMPRESSION: No acute intracranial abnormalities. Chronic atrophy and small vessel ischemic changes.  Straightening of the usual cervical lordosis. Degenerative changes in the cervical spine. No displaced fractures identified.   Electronically Signed   By: Lucienne Capers M.D.   On: 12/13/2013 02:41     EKG Interpretation None      3:44 AM Pt continues to deny pain anywhere except mild soreness of low back from prior fall.  Nontender.    MDM   Final diagnoses:  Fall  from bed    Afebrile, nontoxic patient with fall from bed while asleep.  Denies any pain.  CT head, c-spine negative.   D/C home with close PCP follow up. Advised increased safety precautions at the facility.  Discussed result, findings, treatment, and follow up  with patient.  Pt given return precautions.  Pt verbalizes understanding and agrees with plan.         [place], PA-C 12/13/13 [phone]

## 2013-12-13 NOTE — ED Notes (Signed)
Bed: WA08 Expected date: 12/12/13 Expected time: 11:53 PM Means of arrival: Ambulance Comments: Fall, no complaints

## 2013-12-16 DIAGNOSIS — Z23 Encounter for immunization: Secondary | ICD-10-CM | POA: Diagnosis not present

## 2013-12-17 DIAGNOSIS — E559 Vitamin D deficiency, unspecified: Secondary | ICD-10-CM | POA: Diagnosis not present

## 2013-12-17 DIAGNOSIS — Z9181 History of falling: Secondary | ICD-10-CM | POA: Diagnosis not present

## 2013-12-17 DIAGNOSIS — E119 Type 2 diabetes mellitus without complications: Secondary | ICD-10-CM | POA: Diagnosis not present

## 2013-12-17 DIAGNOSIS — Z5189 Encounter for other specified aftercare: Secondary | ICD-10-CM | POA: Diagnosis not present

## 2013-12-17 DIAGNOSIS — I10 Essential (primary) hypertension: Secondary | ICD-10-CM | POA: Diagnosis not present

## 2013-12-18 DIAGNOSIS — R269 Unspecified abnormalities of gait and mobility: Secondary | ICD-10-CM | POA: Diagnosis not present

## 2013-12-19 DIAGNOSIS — Z5189 Encounter for other specified aftercare: Secondary | ICD-10-CM | POA: Diagnosis not present

## 2013-12-19 DIAGNOSIS — E119 Type 2 diabetes mellitus without complications: Secondary | ICD-10-CM | POA: Diagnosis not present

## 2013-12-24 DIAGNOSIS — E119 Type 2 diabetes mellitus without complications: Secondary | ICD-10-CM | POA: Diagnosis not present

## 2013-12-24 DIAGNOSIS — Z5189 Encounter for other specified aftercare: Secondary | ICD-10-CM | POA: Diagnosis not present

## 2013-12-25 DIAGNOSIS — N390 Urinary tract infection, site not specified: Secondary | ICD-10-CM | POA: Diagnosis not present

## 2013-12-25 DIAGNOSIS — H2513 Age-related nuclear cataract, bilateral: Secondary | ICD-10-CM | POA: Diagnosis not present

## 2013-12-25 DIAGNOSIS — E119 Type 2 diabetes mellitus without complications: Secondary | ICD-10-CM | POA: Diagnosis not present

## 2013-12-25 LAB — HM DIABETES EYE EXAM

## 2013-12-26 DIAGNOSIS — Z79899 Other long term (current) drug therapy: Secondary | ICD-10-CM | POA: Diagnosis not present

## 2013-12-26 DIAGNOSIS — E119 Type 2 diabetes mellitus without complications: Secondary | ICD-10-CM | POA: Diagnosis not present

## 2013-12-26 DIAGNOSIS — Z5189 Encounter for other specified aftercare: Secondary | ICD-10-CM | POA: Diagnosis not present

## 2013-12-31 DIAGNOSIS — E119 Type 2 diabetes mellitus without complications: Secondary | ICD-10-CM | POA: Diagnosis not present

## 2013-12-31 DIAGNOSIS — Z5189 Encounter for other specified aftercare: Secondary | ICD-10-CM | POA: Diagnosis not present

## 2014-01-02 DIAGNOSIS — Z5189 Encounter for other specified aftercare: Secondary | ICD-10-CM | POA: Diagnosis not present

## 2014-01-02 DIAGNOSIS — E119 Type 2 diabetes mellitus without complications: Secondary | ICD-10-CM | POA: Diagnosis not present

## 2014-01-08 DIAGNOSIS — Z5189 Encounter for other specified aftercare: Secondary | ICD-10-CM | POA: Diagnosis not present

## 2014-01-08 DIAGNOSIS — E119 Type 2 diabetes mellitus without complications: Secondary | ICD-10-CM | POA: Diagnosis not present

## 2014-01-09 DIAGNOSIS — E785 Hyperlipidemia, unspecified: Secondary | ICD-10-CM | POA: Diagnosis not present

## 2014-01-09 DIAGNOSIS — I10 Essential (primary) hypertension: Secondary | ICD-10-CM | POA: Diagnosis not present

## 2014-01-09 DIAGNOSIS — E1151 Type 2 diabetes mellitus with diabetic peripheral angiopathy without gangrene: Secondary | ICD-10-CM | POA: Diagnosis not present

## 2014-01-09 DIAGNOSIS — R269 Unspecified abnormalities of gait and mobility: Secondary | ICD-10-CM | POA: Diagnosis not present

## 2014-01-09 DIAGNOSIS — Z6832 Body mass index (BMI) 32.0-32.9, adult: Secondary | ICD-10-CM | POA: Diagnosis not present

## 2014-01-09 DIAGNOSIS — I739 Peripheral vascular disease, unspecified: Secondary | ICD-10-CM | POA: Diagnosis not present

## 2014-01-11 DIAGNOSIS — E119 Type 2 diabetes mellitus without complications: Secondary | ICD-10-CM | POA: Diagnosis not present

## 2014-01-11 DIAGNOSIS — Z5189 Encounter for other specified aftercare: Secondary | ICD-10-CM | POA: Diagnosis not present

## 2014-01-13 DIAGNOSIS — E119 Type 2 diabetes mellitus without complications: Secondary | ICD-10-CM | POA: Diagnosis not present

## 2014-01-13 DIAGNOSIS — Z5189 Encounter for other specified aftercare: Secondary | ICD-10-CM | POA: Diagnosis not present

## 2014-01-15 DIAGNOSIS — R262 Difficulty in walking, not elsewhere classified: Secondary | ICD-10-CM | POA: Diagnosis not present

## 2014-01-15 DIAGNOSIS — E119 Type 2 diabetes mellitus without complications: Secondary | ICD-10-CM | POA: Diagnosis not present

## 2014-01-15 DIAGNOSIS — M6281 Muscle weakness (generalized): Secondary | ICD-10-CM | POA: Diagnosis not present

## 2014-01-15 DIAGNOSIS — G8929 Other chronic pain: Secondary | ICD-10-CM | POA: Diagnosis not present

## 2014-01-21 DIAGNOSIS — M6281 Muscle weakness (generalized): Secondary | ICD-10-CM | POA: Diagnosis not present

## 2014-01-21 DIAGNOSIS — R262 Difficulty in walking, not elsewhere classified: Secondary | ICD-10-CM | POA: Diagnosis not present

## 2014-01-22 DIAGNOSIS — M6281 Muscle weakness (generalized): Secondary | ICD-10-CM | POA: Diagnosis not present

## 2014-01-22 DIAGNOSIS — R262 Difficulty in walking, not elsewhere classified: Secondary | ICD-10-CM | POA: Diagnosis not present

## 2014-01-28 DIAGNOSIS — M6281 Muscle weakness (generalized): Secondary | ICD-10-CM | POA: Diagnosis not present

## 2014-01-28 DIAGNOSIS — R262 Difficulty in walking, not elsewhere classified: Secondary | ICD-10-CM | POA: Diagnosis not present

## 2014-01-29 ENCOUNTER — Encounter (HOSPITAL_COMMUNITY): Payer: Self-pay | Admitting: Emergency Medicine

## 2014-01-29 ENCOUNTER — Emergency Department (HOSPITAL_COMMUNITY): Payer: Medicare Other

## 2014-01-29 ENCOUNTER — Emergency Department (HOSPITAL_COMMUNITY)
Admission: EM | Admit: 2014-01-29 | Discharge: 2014-01-29 | Disposition: A | Discharge status: Home / Self Care | Payer: Medicare Other | Attending: Emergency Medicine | Admitting: Emergency Medicine

## 2014-01-29 DIAGNOSIS — Z0389 Encounter for observation for other suspected diseases and conditions ruled out: Secondary | ICD-10-CM | POA: Diagnosis not present

## 2014-01-29 DIAGNOSIS — E119 Type 2 diabetes mellitus without complications: Secondary | ICD-10-CM | POA: Insufficient documentation

## 2014-01-29 DIAGNOSIS — Y9389 Activity, other specified: Secondary | ICD-10-CM | POA: Insufficient documentation

## 2014-01-29 DIAGNOSIS — Z7982 Long term (current) use of aspirin: Secondary | ICD-10-CM | POA: Insufficient documentation

## 2014-01-29 DIAGNOSIS — R030 Elevated blood-pressure reading, without diagnosis of hypertension: Secondary | ICD-10-CM | POA: Diagnosis not present

## 2014-01-29 DIAGNOSIS — I10 Essential (primary) hypertension: Secondary | ICD-10-CM | POA: Insufficient documentation

## 2014-01-29 DIAGNOSIS — Z79899 Other long term (current) drug therapy: Secondary | ICD-10-CM | POA: Insufficient documentation

## 2014-01-29 DIAGNOSIS — Y998 Other external cause status: Secondary | ICD-10-CM | POA: Insufficient documentation

## 2014-01-29 DIAGNOSIS — G2581 Restless legs syndrome: Secondary | ICD-10-CM | POA: Insufficient documentation

## 2014-01-29 DIAGNOSIS — M549 Dorsalgia, unspecified: Secondary | ICD-10-CM | POA: Diagnosis not present

## 2014-01-29 DIAGNOSIS — Y9289 Other specified places as the place of occurrence of the external cause: Secondary | ICD-10-CM | POA: Insufficient documentation

## 2014-01-29 DIAGNOSIS — W19XXXA Unspecified fall, initial encounter: Secondary | ICD-10-CM

## 2014-01-29 DIAGNOSIS — R52 Pain, unspecified: Secondary | ICD-10-CM | POA: Diagnosis not present

## 2014-01-29 DIAGNOSIS — M1990 Unspecified osteoarthritis, unspecified site: Secondary | ICD-10-CM | POA: Insufficient documentation

## 2014-01-29 DIAGNOSIS — Z043 Encounter for examination and observation following other accident: Secondary | ICD-10-CM | POA: Diagnosis not present

## 2014-01-29 DIAGNOSIS — S0990XA Unspecified injury of head, initial encounter: Secondary | ICD-10-CM | POA: Diagnosis not present

## 2014-01-29 DIAGNOSIS — F329 Major depressive disorder, single episode, unspecified: Secondary | ICD-10-CM | POA: Diagnosis not present

## 2014-01-29 DIAGNOSIS — F419 Anxiety disorder, unspecified: Secondary | ICD-10-CM | POA: Diagnosis not present

## 2014-01-29 DIAGNOSIS — W06XXXA Fall from bed, initial encounter: Secondary | ICD-10-CM | POA: Insufficient documentation

## 2014-01-29 DIAGNOSIS — M25521 Pain in right elbow: Secondary | ICD-10-CM | POA: Diagnosis not present

## 2014-01-29 DIAGNOSIS — S299XXA Unspecified injury of thorax, initial encounter: Secondary | ICD-10-CM | POA: Diagnosis not present

## 2014-01-29 DIAGNOSIS — M25511 Pain in right shoulder: Secondary | ICD-10-CM | POA: Diagnosis not present

## 2014-01-29 LAB — URINALYSIS, ROUTINE W REFLEX MICROSCOPIC
Bilirubin Urine: NEGATIVE
Glucose, UA: >1000 mg/dL — AB
KETONES UR: NEGATIVE mg/dL
LEUKOCYTES UA: NEGATIVE
Nitrite: NEGATIVE
Specific Gravity, Urine: 1.018 (ref 1.005–1.030)
UROBILINOGEN UA: 1 mg/dL (ref 0.0–1.0)
pH: 6.5 (ref 5.0–8.0)

## 2014-01-29 LAB — I-STAT CHEM 8, ED
BUN: 22 mg/dL (ref 6–23)
CHLORIDE: 102 meq/L (ref 96–112)
CREATININE: 1 mg/dL (ref 0.50–1.10)
Calcium, Ion: 1.2 mmol/L (ref 1.13–1.30)
GLUCOSE: 310 mg/dL — AB (ref 70–99)
HCT: 36 % (ref 36.0–46.0)
Hemoglobin: 12.2 g/dL (ref 12.0–15.0)
Potassium: 3.2 mEq/L — ABNORMAL LOW (ref 3.7–5.3)
SODIUM: 142 meq/L (ref 137–147)
TCO2: 27 mmol/L (ref 0–100)

## 2014-01-29 LAB — URINE MICROSCOPIC-ADD ON

## 2014-01-29 LAB — MAGNESIUM: Magnesium: 1.2 mg/dL — ABNORMAL LOW (ref 1.5–2.5)

## 2014-01-29 MED ORDER — POTASSIUM CHLORIDE CRYS ER 20 MEQ PO TBCR
40.0000 meq | EXTENDED_RELEASE_TABLET | Freq: Once | ORAL | Status: AC
  Administered 2014-01-29: 40 meq via ORAL
  Filled 2014-01-29: qty 2

## 2014-01-29 MED ORDER — MAGNESIUM CHLORIDE 64 MG PO TBEC
1.0000 | DELAYED_RELEASE_TABLET | Freq: Once | ORAL | Status: AC
  Administered 2014-01-29: 64 mg via ORAL
  Filled 2014-01-29: qty 1

## 2014-01-29 MED ORDER — MAGNESIUM CHLORIDE 64 MG PO TBEC: 1.0000 | DELAYED_RELEASE_TABLET | Freq: Every day | ORAL | Status: DC

## 2014-01-29 NOTE — ED Notes (Signed)
Per EMS pt is a resident at [facility] and had an unwitnessed fall this morning off the bed  Pt was found on the padded mat next to the bed  Pt denies any pain except for back pain that is normal for her  Pt denies any new complaints

## 2014-01-29 NOTE — ED Provider Notes (Signed)
CSN: [id number]     Arrival date & time 01/29/14  0418 History   First MD Initiated Contact with Patient 01/29/14 0422     Chief Complaint  Patient presents with  . Fall     (Consider location/radiation/quality/duration/timing/severity/associated sxs/prior Treatment) HPI [name] is a 73 y.o. female with past medical history of diabetes, hyperlipidemia, hypertension presenting today after an unwitnessed fall. Patient states she was rolling over in her bed and did not know she was close to the end. She fell and hit the ground, she states she landed on her bottom. She denies hitting her head or loss of consciousness. She is currently denying pain anywhere. She denies any prodromal symptoms such as headache, chest pain, abdominal pain. Patient has had no recent fevers or infections. She denies any new medication changes. She has no further complaints.  10 Systems reviewed and are negative for acute change except as noted in the HPI.      Past Medical History  Diagnosis Date  . Anxiety   . Dyspnea   . Diabetes mellitus   . Osteoarthritis   . Hypercholesteremia   . Depression   . RLS (restless legs syndrome)   . Stress   . PONV (postoperative nausea and vomiting)   . Headache(784.0)   . HTN (hypertension)     dr Johnsie Cancel   Past Surgical History  Procedure Laterality Date  . Colonoscopy    . Abdominal hysterectomy    . Lumbar laminectomy/decompression microdiscectomy N/A 04/18/2012    Procedure: LUMBAR LAMINECTOMY/DECOMPRESSION MICRODISCECTOMY 3 LEVELS;  Surgeon: Winfield Cunas, MD;  Location: [facility];  Service: Neurosurgery;  Laterality: N/A;  Lumbar three-four, lumbar four-five, lumbar five-sacral one decompression. Synovial cyst resection lumbar four-five  . Joint replacement    . Left forearm fracture with orif Left   . Replacement total knee      right   Family History  Problem Relation Age of Onset  . Diabetes Father   . High blood pressure Father   . High  blood pressure Mother    History  Substance Use Topics  . Smoking status: Never Smoker   . Smokeless tobacco: Never Used  . Alcohol Use: No   OB History    No data available     Review of Systems    Allergies  Morphine and related; Oxycodone; and Sulfonamide derivatives  Home Medications   Prior to Admission medications   Medication Sig Start Date End Date Taking? Authorizing Provider  acetaminophen (TYLENOL) 500 MG tablet Take 500 mg by mouth every 6 (six) hours as needed for mild pain.    Historical Provider, MD  ALPRAZolam Duanne Moron) 0.5 MG tablet Take 0.5 mg by mouth at bedtime.     Historical Provider, MD  aspirin EC 81 MG tablet Take 81 mg by mouth daily.    Historical Provider, MD  B Complex Vitamins (B-COMPLEX/B-12 PO) Take by mouth daily. Once a week.    Historical Provider, MD  bisacodyl (DULCOLAX) 10 MG suppository Place 10 mg rectally as needed for moderate constipation.    Historical Provider, MD  glipiZIDE (GLUCOTROL XL) 5 MG 24 hr tablet Take 5 mg by mouth daily with breakfast.     Historical Provider, MD  HYDROcodone-acetaminophen (NORCO/VICODIN) 5-325 MG per tablet Take 1 tablet by mouth every 6 (six) hours as needed for moderate pain (pain).    Historical Provider, MD  losartan (COZAAR) 100 MG tablet Take 100 mg by mouth daily.    Historical  Provider, MD  magnesium hydroxide (MILK OF MAGNESIA) 400 MG/5ML suspension Take 30 mLs by mouth daily as needed for mild constipation or moderate constipation. 07/26/13   [name], MD  metFORMIN (GLUCOPHAGE) 500 MG tablet Take 500 mg by mouth 2 (two) times daily with a meal.    Historical Provider, MD  metoCLOPramide (REGLAN) 10 MG tablet Take 10 mg by mouth See admin instructions. Give one tablet by mouth before meals and at bedtime for nausea and vomitting. Give 30 min befre meals.    Historical Provider, MD  metoprolol (TOPROL-XL) 100 MG 24 hr tablet Take 100 mg by mouth daily.      Historical Provider, MD  omeprazole  (PRILOSEC) 40 MG capsule Take 40 mg by mouth daily.    Historical Provider, MD  ondansetron (ZOFRAN) 4 MG tablet Take 4 mg by mouth every 8 (eight) hours as needed for nausea or vomiting.    Historical Provider, MD  polyethylene glycol (MIRALAX / GLYCOLAX) packet Take 17 g by mouth daily. 07/26/13   [name], MD  potassium chloride SA (K-DUR,KLOR-CON) 20 MEQ tablet Take 40 mEq by mouth daily.     Historical Provider, MD  pramipexole (MIRAPEX) 0.25 MG tablet Take 0.375 mg by mouth at bedtime. Morning and night.    Historical Provider, MD  senna-docusate (SENOKOT-S) 8.6-50 MG per tablet Take 1 tablet by mouth at bedtime as needed for mild constipation. 07/26/13   [name], MD  sertraline (ZOLOFT) 50 MG tablet Take 50 mg by mouth daily.    Historical Provider, MD  verapamil (CALAN-SR) 240 MG CR tablet Take 1 tablet (240 mg total) by mouth daily. 07/26/13   [name], MD   BP 149/82 mmHg  Pulse 78  Temp(Src) 98.3 F (36.8 C) (Oral)  Resp 18  SpO2 95% Physical Exam  Constitutional: She is oriented to person, place, and time. She appears well-developed and well-nourished. No distress.  HENT:  Head: Normocephalic and atraumatic.  Nose: Nose normal.  Mouth/Throat: Oropharynx is clear and moist. No oropharyngeal exudate.  Eyes: Conjunctivae and EOM are normal. Pupils are equal, round, and reactive to light. No scleral icterus.  Neck: Normal range of motion. Neck supple. No JVD present. No tracheal deviation present. No thyromegaly present.  Cardiovascular: Normal rate, regular rhythm and normal heart sounds.  Exam reveals no gallop and no friction rub.   No murmur heard. Pulmonary/Chest: Effort normal and breath sounds normal. No respiratory distress. She has no wheezes. She exhibits no tenderness.  Abdominal: Soft. Bowel sounds are normal. She exhibits no distension and no mass. There is no tenderness. There is no rebound and no guarding.  Musculoskeletal: Normal range of motion. She  exhibits no edema or tenderness.  Lymphadenopathy:    She has no cervical adenopathy.  Neurological: She is alert and oriented to person, place, and time. No cranial nerve deficit. She exhibits normal muscle tone.  5 out of 5 strength 4 extremities, normal sensation 4 extremities, normal cerebellar testing.  Skin: Skin is warm and dry. No rash noted. She is not diaphoretic. No erythema. No pallor.  Nursing note and vitals reviewed.   ED Course  Procedures (including critical care time) Labs Review Labs Reviewed  URINALYSIS, ROUTINE W REFLEX MICROSCOPIC - Abnormal; Notable for the following:    Glucose, UA >1000 (*)    Hgb urine dipstick SMALL (*)    Protein, ur >300 (*)    All other components within normal limits  MAGNESIUM - Abnormal; Notable for the  following:    Magnesium 1.2 (*)    All other components within normal limits  URINE MICROSCOPIC-ADD ON - Abnormal; Notable for the following:    Casts HYALINE CASTS (*)    All other components within normal limits  I-STAT CHEM 8, ED - Abnormal; Notable for the following:    Potassium 3.2 (*)    Glucose, Bld 310 (*)    All other components within normal limits  URINE CULTURE    Imaging Review Ct Head Wo Contrast  01/29/2014   CLINICAL DATA:  Unwitnessed fall this morning, found on padded mat beside bed.  EXAM: CT HEAD WITHOUT CONTRAST  TECHNIQUE: Contiguous axial images were obtained from the base of the skull through the vertex without intravenous contrast.  COMPARISON:  CT of the head December 13, 2013 and CT of the head November 09, 2013  FINDINGS: Moderate to severe ventriculomegaly, with mild sulcal effacement at the convexities, similar. No intraparenchymal hemorrhage, mass effect nor midline shift. Patchy supratentorial white matter hypodensities are within normal range for patient's age and though non-specific suggest sequelae of chronic small vessel ischemic disease. Mild mid brain volume loss. No acute large vascular  territory infarcts.  No abnormal extra-axial fluid collections. Basal cisterns are patent. Moderate calcific atherosclerosis of the carotid siphons.  No skull fracture. Subcentimeter bony excrescence from the outer table of the RIGHT frontal calvarium. The included ocular globes and orbital contents are non-suspicious. Partially imaged possible LEFT maxillary mucosal retention cyst.  IMPRESSION: No acute intracranial process.  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   Electronically Signed   By: Elon Alas   On: 01/29/2014 05:21     EKG Interpretation   Date/Time:  Wednesday January 29 2014 04:44:26 EST Ventricular Rate:  71 PR Interval:  145 QRS Duration: 87 QT Interval:  536 QTC Calculation: 583 R Axis:   -40 Text Interpretation:  Sinus rhythm Left axis deviation Abnormal R-wave  progression, early transition LVH with secondary repolarization  abnormality Prolonged QT interval No significant change since last tracing  Confirmed by Glynn Octave [phone]) on 01/29/2014 4:53:44 AM      MDM   Final diagnoses:  Fall    Patient presents emergency department after a fall. Will evaluate with CT scan and infectious workup. This likely a mechanical fall due to her history, patient is not currently complaining of pain anywhere. She is not requesting anything for pain at this time.  CT scan does not reveal any acute injury. There is ventriculomegaly and possible normal pressure hydrocephalus that was seen on prior CT scan as well. I do not Believe this is the etiology of the patient's fall as she has had a normal mental status, and no history of incontinence. EKG does show prolonged QT that was present on prior EKGs. Potassium is 3.2 which is replaced. Magnesium was 1.2 and was also replaced. Patient was given a prescription for 1 week course of magnesium, this could be the cause of her prolonged QT. Patient denies syncope do not believe her  prolonged QT caused her to fall out of bed as she has had this in multiple prior EKGs. Patient's vital signs remain within her normal limits and she is safe for discharge.    Everlene Balls, MD 01/29/14 [phone]

## 2014-01-29 NOTE — ED Notes (Signed)
[facility] at bedside to transport pt at this time.

## 2014-01-29 NOTE — Discharge Instructions (Signed)
Fall Prevention and Home Safety Ms. Miera, you were seen today after a fall.  Your CT scan does not show any injuries. They did see increased pressure which has been seen on previous CT scans as well. Your potassium and magnesium was replaced in the emergency department. Continue to take supplements for the next week and have your blood work checked again.  Follow-up with your primary care physician within 3 days for continued management. If symptoms worsen come back to emergency department immediately.  Thank you. Falls cause injuries and can affect all age groups. It is possible to prevent falls.  HOW TO PREVENT FALLS  Wear shoes with rubber soles that do not have an opening for your toes.  Keep the inside and outside of your house well lit.  Use night lights throughout your home.  Remove clutter from floors.  Clean up floor spills.  Remove throw rugs or fasten them to the floor with carpet tape.  Do not place electrical cords across pathways.  Put grab bars by your tub, shower, and toilet. Do not use towel bars as grab bars.  Put handrails on both sides of the stairway. Fix loose handrails.  Do not climb on stools or stepladders, if possible.  Do not wax your floors.  Repair uneven or unsafe sidewalks, walkways, or stairs.  Keep items you use a lot within reach.  Be aware of pets.  Keep emergency numbers next to the telephone.  Put smoke detectors in your home and near bedrooms. Ask your doctor what other things you can do to prevent falls. Document Released: 12/04/2008 Document Revised: 08/09/2011 Document Reviewed: 05/10/2011 [facility] Patient Information 2015 [place], [place]. This information is not intended to replace advice given to you by your health care provider. Make sure you discuss any questions you have with your health care provider.

## 2014-01-29 NOTE — ED Notes (Signed)
MD at bedside. 

## 2014-01-30 LAB — URINE CULTURE
Colony Count: NO GROWTH
Culture: NO GROWTH

## 2014-01-31 DIAGNOSIS — R262 Difficulty in walking, not elsewhere classified: Secondary | ICD-10-CM | POA: Diagnosis not present

## 2014-01-31 DIAGNOSIS — M6281 Muscle weakness (generalized): Secondary | ICD-10-CM | POA: Diagnosis not present

## 2014-02-04 DIAGNOSIS — I10 Essential (primary) hypertension: Secondary | ICD-10-CM | POA: Diagnosis not present

## 2014-02-04 DIAGNOSIS — N390 Urinary tract infection, site not specified: Secondary | ICD-10-CM | POA: Diagnosis not present

## 2014-02-04 DIAGNOSIS — R2689 Other abnormalities of gait and mobility: Secondary | ICD-10-CM | POA: Diagnosis not present

## 2014-02-04 DIAGNOSIS — E1165 Type 2 diabetes mellitus with hyperglycemia: Secondary | ICD-10-CM | POA: Diagnosis not present

## 2014-02-05 DIAGNOSIS — R262 Difficulty in walking, not elsewhere classified: Secondary | ICD-10-CM | POA: Diagnosis not present

## 2014-02-05 DIAGNOSIS — M6281 Muscle weakness (generalized): Secondary | ICD-10-CM | POA: Diagnosis not present

## 2014-02-07 DIAGNOSIS — I10 Essential (primary) hypertension: Secondary | ICD-10-CM | POA: Diagnosis not present

## 2014-02-07 DIAGNOSIS — I739 Peripheral vascular disease, unspecified: Secondary | ICD-10-CM | POA: Diagnosis not present

## 2014-02-07 DIAGNOSIS — Z6826 Body mass index (BMI) 26.0-26.9, adult: Secondary | ICD-10-CM | POA: Diagnosis not present

## 2014-02-07 DIAGNOSIS — E119 Type 2 diabetes mellitus without complications: Secondary | ICD-10-CM | POA: Diagnosis not present

## 2014-02-07 DIAGNOSIS — R634 Abnormal weight loss: Secondary | ICD-10-CM | POA: Diagnosis not present

## 2014-02-07 DIAGNOSIS — E1151 Type 2 diabetes mellitus with diabetic peripheral angiopathy without gangrene: Secondary | ICD-10-CM | POA: Diagnosis not present

## 2014-02-08 DIAGNOSIS — R262 Difficulty in walking, not elsewhere classified: Secondary | ICD-10-CM | POA: Diagnosis not present

## 2014-02-08 DIAGNOSIS — M6281 Muscle weakness (generalized): Secondary | ICD-10-CM | POA: Diagnosis not present

## 2014-02-11 DIAGNOSIS — M6281 Muscle weakness (generalized): Secondary | ICD-10-CM | POA: Diagnosis not present

## 2014-02-11 DIAGNOSIS — R262 Difficulty in walking, not elsewhere classified: Secondary | ICD-10-CM | POA: Diagnosis not present

## 2014-02-12 ENCOUNTER — Encounter (HOSPITAL_COMMUNITY): Payer: Self-pay | Admitting: *Deleted

## 2014-02-12 ENCOUNTER — Emergency Department (HOSPITAL_COMMUNITY)
Admission: EM | Admit: 2014-02-12 | Discharge: 2014-02-12 | Disposition: A | Discharge status: Home / Self Care | Payer: Medicare Other | Attending: Emergency Medicine | Admitting: Emergency Medicine

## 2014-02-12 DIAGNOSIS — R259 Unspecified abnormal involuntary movements: Secondary | ICD-10-CM | POA: Diagnosis not present

## 2014-02-12 DIAGNOSIS — Z8669 Personal history of other diseases of the nervous system and sense organs: Secondary | ICD-10-CM | POA: Insufficient documentation

## 2014-02-12 DIAGNOSIS — Y9389 Activity, other specified: Secondary | ICD-10-CM | POA: Insufficient documentation

## 2014-02-12 DIAGNOSIS — Z794 Long term (current) use of insulin: Secondary | ICD-10-CM | POA: Diagnosis not present

## 2014-02-12 DIAGNOSIS — E119 Type 2 diabetes mellitus without complications: Secondary | ICD-10-CM | POA: Insufficient documentation

## 2014-02-12 DIAGNOSIS — S0083XA Contusion of other part of head, initial encounter: Secondary | ICD-10-CM | POA: Diagnosis not present

## 2014-02-12 DIAGNOSIS — F419 Anxiety disorder, unspecified: Secondary | ICD-10-CM | POA: Insufficient documentation

## 2014-02-12 DIAGNOSIS — T148XXA Other injury of unspecified body region, initial encounter: Secondary | ICD-10-CM

## 2014-02-12 DIAGNOSIS — S0190XA Unspecified open wound of unspecified part of head, initial encounter: Secondary | ICD-10-CM | POA: Diagnosis not present

## 2014-02-12 DIAGNOSIS — S0990XA Unspecified injury of head, initial encounter: Secondary | ICD-10-CM | POA: Diagnosis not present

## 2014-02-12 DIAGNOSIS — Y998 Other external cause status: Secondary | ICD-10-CM | POA: Diagnosis not present

## 2014-02-12 DIAGNOSIS — M549 Dorsalgia, unspecified: Secondary | ICD-10-CM | POA: Diagnosis not present

## 2014-02-12 DIAGNOSIS — M1990 Unspecified osteoarthritis, unspecified site: Secondary | ICD-10-CM | POA: Insufficient documentation

## 2014-02-12 DIAGNOSIS — Y92129 Unspecified place in nursing home as the place of occurrence of the external cause: Secondary | ICD-10-CM | POA: Diagnosis not present

## 2014-02-12 DIAGNOSIS — Y929 Unspecified place or not applicable: Secondary | ICD-10-CM | POA: Diagnosis not present

## 2014-02-12 DIAGNOSIS — I10 Essential (primary) hypertension: Secondary | ICD-10-CM | POA: Insufficient documentation

## 2014-02-12 DIAGNOSIS — Z79899 Other long term (current) drug therapy: Secondary | ICD-10-CM | POA: Diagnosis not present

## 2014-02-12 DIAGNOSIS — Z792 Long term (current) use of antibiotics: Secondary | ICD-10-CM | POA: Diagnosis not present

## 2014-02-12 DIAGNOSIS — S0081XA Abrasion of other part of head, initial encounter: Secondary | ICD-10-CM | POA: Diagnosis not present

## 2014-02-12 DIAGNOSIS — S098XXA Other specified injuries of head, initial encounter: Secondary | ICD-10-CM | POA: Diagnosis not present

## 2014-02-12 DIAGNOSIS — W19XXXA Unspecified fall, initial encounter: Secondary | ICD-10-CM

## 2014-02-12 LAB — CBG MONITORING, ED: GLUCOSE-CAPILLARY: 289 mg/dL — AB (ref 70–99)

## 2014-02-12 LAB — I-STAT CHEM 8, ED
BUN: 32 mg/dL — ABNORMAL HIGH (ref 6–23)
CALCIUM ION: 1.31 mmol/L — AB (ref 1.13–1.30)
CREATININE: 1.2 mg/dL — AB (ref 0.50–1.10)
Chloride: 104 mEq/L (ref 96–112)
Glucose, Bld: 309 mg/dL — ABNORMAL HIGH (ref 70–99)
HCT: 38 % (ref 36.0–46.0)
Hemoglobin: 12.9 g/dL (ref 12.0–15.0)
Potassium: 4.5 mmol/L (ref 3.5–5.1)
Sodium: 141 mmol/L (ref 135–145)
TCO2: 24 mmol/L (ref 0–100)

## 2014-02-12 NOTE — ED Notes (Signed)
Pt was seated in w/c leaned forward to fix the wheel and fell forward. Struck forehead, abrasion to forehead. No LOC, denies pain. Sent for eval from [facility].   Has taken all daily meds including insulin, CBG en route 375.

## 2014-02-12 NOTE — ED Notes (Signed)
Bed: WA06 Expected date:  Expected time:  Means of arrival:  Comments: Ems- elderly fall from seated position

## 2014-02-12 NOTE — ED Notes (Signed)
[facility] called for transportation back to [facility]

## 2014-02-12 NOTE — ED Provider Notes (Signed)
CSN: [id number]     Arrival date & time 02/12/14  1405 History   First MD Initiated Contact with Patient 02/12/14 1417     Chief Complaint  Patient presents with  . Fall     (Consider location/radiation/quality/duration/timing/severity/associated sxs/prior Treatment) HPI   [name] is a 73 y.o. female brought in by EMS status post fall at nursing home. Patient was in wheelchair, leaned forward and fell onto carpeted ground. She denies loss of consciousness, blood thinners, cervicalgia, chest pain, shortness of breath, nausea, vomiting, change in vision, dysarthria, abdominal pain, weakness.  Past Medical History  Diagnosis Date  . Anxiety   . Dyspnea   . Diabetes mellitus   . Osteoarthritis   . Hypercholesteremia   . Depression   . RLS (restless legs syndrome)   . Stress   . PONV (postoperative nausea and vomiting)   . Headache(784.0)   . HTN (hypertension)     dr Johnsie Cancel   Past Surgical History  Procedure Laterality Date  . Colonoscopy    . Abdominal hysterectomy    . Lumbar laminectomy/decompression microdiscectomy N/A 04/18/2012    Procedure: LUMBAR LAMINECTOMY/DECOMPRESSION MICRODISCECTOMY 3 LEVELS;  Surgeon: Winfield Cunas, MD;  Location: [facility];  Service: Neurosurgery;  Laterality: N/A;  Lumbar three-four, lumbar four-five, lumbar five-sacral one decompression. Synovial cyst resection lumbar four-five  . Joint replacement    . Left forearm fracture with orif Left   . Replacement total knee      right   Family History  Problem Relation Age of Onset  . Diabetes Father   . High blood pressure Father   . High blood pressure Mother    History  Substance Use Topics  . Smoking status: Never Smoker   . Smokeless tobacco: Never Used  . Alcohol Use: No   OB History    No data available     Review of Systems  10 systems reviewed and found to be negative, except as noted in the HPI.   Allergies  Morphine and related; Oxycodone; and Sulfonamide  derivatives  Home Medications   Prior to Admission medications   Medication Sig Start Date End Date Taking? Authorizing Provider  acetaminophen (TYLENOL) 500 MG tablet Take 500 mg by mouth every 4 (four) hours as needed for mild pain, fever or headache (99.5 to 101).    Yes Historical Provider, MD  ALPRAZolam Duanne Moron) 0.5 MG tablet Take 0.5 mg by mouth at bedtime.    Yes Historical Provider, MD  alum & mag hydroxide-simeth (MAALOX/MYLANTA) 200-200-20 MG/5ML suspension Take 30 mLs by mouth every 6 (six) hours as needed for indigestion or heartburn. Do not exceed 4 doses in 24 hours   Yes Historical Provider, MD  aspirin EC 81 MG tablet Take 81 mg by mouth daily.   Yes Historical Provider, MD  B Complex Vitamins (B-COMPLEX/B-12 PO) Take 1 tablet by mouth daily.    Yes Historical Provider, MD  bisacodyl (DULCOLAX) 10 MG suppository Place 10 mg rectally as needed for moderate constipation.   Yes Historical Provider, MD  cholecalciferol (VITAMIN D) 1000 UNITS tablet Take 1,000 Units by mouth daily.   Yes Historical Provider, MD  Cranberry 475 MG CAPS Take 2 capsules by mouth daily.   Yes Historical Provider, MD  glipiZIDE (GLUCOTROL XL) 5 MG 24 hr tablet Take 5 mg by mouth daily with breakfast.    Yes Historical Provider, MD  guaifenesin (ROBITUSSIN) 100 MG/5ML syrup Take 200 mg by mouth every 6 (six) hours as  needed for cough. Do not exceed 4 doses in 24 hours   Yes Historical Provider, MD  HYDROcodone-acetaminophen (NORCO/VICODIN) 5-325 MG per tablet Take 1 tablet by mouth every 8 (eight) hours as needed for moderate pain (pain). Do not exceed 3 doses in 24 hours   Yes Historical Provider, MD  insulin detemir (LEVEMIR) 100 UNIT/ML injection Inject 10 Units into the skin daily. Just before lunch   Yes Historical Provider, MD  loperamide (IMODIUM) 2 MG capsule Take 2 mg by mouth as needed for diarrhea or loose stools. Do not exceed 8 doses in 24 hours   Yes Historical Provider, MD  losartan (COZAAR) 100  MG tablet Take 100 mg by mouth daily.   Yes Historical Provider, MD  magnesium chloride (SLOW-MAG) 64 MG TBEC SR tablet Take 1 tablet (64 mg total) by mouth daily. 01/29/14  Yes Everlene Balls, MD  magnesium hydroxide (MILK OF MAGNESIA) 400 MG/5ML suspension Take 30 mLs by mouth daily as needed for mild constipation or moderate constipation. 07/26/13  Yes [name], MD  metFORMIN (GLUCOPHAGE) 500 MG tablet Take 500 mg by mouth 2 (two) times daily with a meal.   Yes Historical Provider, MD  metoCLOPramide (REGLAN) 10 MG tablet Take 10 mg by mouth 4 (four) times daily -  before meals and at bedtime. Give one tablet by mouth before meals and at bedtime for nausea and vomitting. Give 30 min befre meals.   Yes Historical Provider, MD  metoprolol (TOPROL-XL) 100 MG 24 hr tablet Take 100 mg by mouth daily.     Yes Historical Provider, MD  neomycin-bacitracin-polymyxin (NEOSPORIN) [phone] ointment Apply 1 application topically 4 (four) times daily as needed (skin tears, abrasions, minor irritations).   Yes Historical Provider, MD  omeprazole (PRILOSEC) 40 MG capsule Take 40 mg by mouth daily.   Yes Historical Provider, MD  ondansetron (ZOFRAN) 4 MG tablet Take 4 mg by mouth 3 (three) times daily.    Yes Historical Provider, MD  polyethylene glycol (MIRALAX / GLYCOLAX) packet Take 17 g by mouth daily. 07/26/13  Yes [name], MD  potassium chloride SA (K-DUR,KLOR-CON) 20 MEQ tablet Take 40 mEq by mouth daily.    Yes Historical Provider, MD  pramipexole (MIRAPEX) 0.25 MG tablet Take 0.375 mg by mouth at bedtime. Morning and night.   Yes Historical Provider, MD  senna-docusate (SENOKOT-S) 8.6-50 MG per tablet Take 1 tablet by mouth at bedtime as needed for mild constipation. 07/26/13  Yes [name], MD  sertraline (ZOLOFT) 50 MG tablet Take 50 mg by mouth daily.   Yes Historical Provider, MD  temazepam (RESTORIL) 7.5 MG capsule Take 7.5 mg by mouth at bedtime as needed for sleep.   Yes Historical  Provider, MD  UNABLE TO FIND Take 1 Can by mouth 4 (four) times daily - after meals and at bedtime. Mighty Shakes   Yes Historical Provider, MD  verapamil (CALAN-SR) 240 MG CR tablet Take 1 tablet (240 mg total) by mouth daily. 07/26/13  Yes [name], MD  zinc oxide (BALMEX) 11.3 % CREA cream Apply 1 application topically 2 (two) times daily. To rash   Yes Historical Provider, MD   BP 137/80 mmHg  Pulse 71  Temp(Src) 98.1 F (36.7 C) (Oral)  Resp 18  SpO2 95% Physical Exam  Constitutional: She is oriented to person, place, and time. She appears well-developed and well-nourished. No distress.  HENT:  Head: Normocephalic.  Right Ear: External ear normal.  Left Ear: External ear normal.  Mouth/Throat:  Oropharynx is clear and moist.  2 cm abrasion to right forehead.  Eyes: Conjunctivae and EOM are normal. Pupils are equal, round, and reactive to light.  Neck: Normal range of motion. Neck supple.  No midline C-spine  tenderness to palpation or step-offs appreciated. Patient has full range of motion without pain.   Cardiovascular: Normal rate, regular rhythm and intact distal pulses.   Pulmonary/Chest: Effort normal and breath sounds normal. No respiratory distress. She has no wheezes. She has no rales. She exhibits no tenderness.  No TTP or crepitance  Abdominal: Soft. Bowel sounds are normal. She exhibits no distension and no mass. There is no tenderness. There is no rebound and no guarding.  Musculoskeletal: Normal range of motion. She exhibits no edema or tenderness.  Pelvis stable. No deformity or TTP of major joints.   Good ROM  Neurological: She is alert and oriented to person, place, and time.  Strength 5/5 x4 extremities   Distal sensation intact  Skin: Skin is warm.  Psychiatric: She has a normal mood and affect.  Nursing note and vitals reviewed.   ED Course  Procedures (including critical care time) Labs Review Labs Reviewed  CBG MONITORING, ED - Abnormal;  Notable for the following:    Glucose-Capillary 289 (*)    All other components within normal limits  I-STAT CHEM 8, ED    Imaging Review No results found.   EKG Interpretation None      MDM   Final diagnoses:  Head trauma  Fall at nursing home, initial encounter  Abrasion    Filed Vitals:   02/12/14 1408  BP: 137/80  Pulse: 71  Temp: 98.1 F (36.7 C)  TempSrc: Oral  Resp: 18  SpO2: 95%    [name] is a pleasant 73 y.o. female presenting with fall from wheelchair at nursing home. No loss of consciousness, patient is not anticoagulated. Neuro exam is nonfocal. Blood sugar is elevated however anion gap is normal at 13.  This is a shared visit with the attending physician who personally evaluated the patient and agrees with the care plan.   Evaluation does not show pathology that would require ongoing emergent intervention or inpatient treatment. Pt is hemodynamically stable and mentating appropriately. Discussed findings and plan with patient/guardian, who agrees with care plan. All questions answered. Return precautions discussed and outpatient follow up given.      Monico Blitz, PA-C 02/12/14 [place], MD 02/12/14 2038

## 2014-02-12 NOTE — Discharge Instructions (Signed)
Please follow with your primary care doctor in the next 2 days for a check-up. They must obtain records for further management.  ° °Do not hesitate to return to the Emergency Department for any new, worsening or concerning symptoms.  ° °

## 2014-02-15 DIAGNOSIS — M6281 Muscle weakness (generalized): Secondary | ICD-10-CM | POA: Diagnosis not present

## 2014-02-15 DIAGNOSIS — R262 Difficulty in walking, not elsewhere classified: Secondary | ICD-10-CM | POA: Diagnosis not present

## 2014-02-18 DIAGNOSIS — R262 Difficulty in walking, not elsewhere classified: Secondary | ICD-10-CM | POA: Diagnosis not present

## 2014-02-18 DIAGNOSIS — M6281 Muscle weakness (generalized): Secondary | ICD-10-CM | POA: Diagnosis not present

## 2014-02-20 DIAGNOSIS — M6281 Muscle weakness (generalized): Secondary | ICD-10-CM | POA: Diagnosis not present

## 2014-02-20 DIAGNOSIS — R262 Difficulty in walking, not elsewhere classified: Secondary | ICD-10-CM | POA: Diagnosis not present

## 2014-02-24 DIAGNOSIS — R262 Difficulty in walking, not elsewhere classified: Secondary | ICD-10-CM | POA: Diagnosis not present

## 2014-02-24 DIAGNOSIS — M6281 Muscle weakness (generalized): Secondary | ICD-10-CM | POA: Diagnosis not present

## 2014-02-25 DIAGNOSIS — I10 Essential (primary) hypertension: Secondary | ICD-10-CM | POA: Diagnosis not present

## 2014-02-25 DIAGNOSIS — R2689 Other abnormalities of gait and mobility: Secondary | ICD-10-CM | POA: Diagnosis not present

## 2014-02-25 DIAGNOSIS — E1165 Type 2 diabetes mellitus with hyperglycemia: Secondary | ICD-10-CM | POA: Diagnosis not present

## 2014-03-01 DIAGNOSIS — R262 Difficulty in walking, not elsewhere classified: Secondary | ICD-10-CM | POA: Diagnosis not present

## 2014-03-01 DIAGNOSIS — M6281 Muscle weakness (generalized): Secondary | ICD-10-CM | POA: Diagnosis not present

## 2014-03-07 DIAGNOSIS — R262 Difficulty in walking, not elsewhere classified: Secondary | ICD-10-CM | POA: Diagnosis not present

## 2014-03-07 DIAGNOSIS — M6281 Muscle weakness (generalized): Secondary | ICD-10-CM | POA: Diagnosis not present

## 2014-04-08 DIAGNOSIS — G2581 Restless legs syndrome: Secondary | ICD-10-CM | POA: Diagnosis not present

## 2014-04-08 DIAGNOSIS — E1165 Type 2 diabetes mellitus with hyperglycemia: Secondary | ICD-10-CM | POA: Diagnosis not present

## 2014-04-08 DIAGNOSIS — F5101 Primary insomnia: Secondary | ICD-10-CM | POA: Diagnosis not present

## 2014-04-08 DIAGNOSIS — G8911 Acute pain due to trauma: Secondary | ICD-10-CM | POA: Diagnosis not present

## 2014-04-14 DIAGNOSIS — Z9181 History of falling: Secondary | ICD-10-CM | POA: Diagnosis not present

## 2014-04-14 DIAGNOSIS — E119 Type 2 diabetes mellitus without complications: Secondary | ICD-10-CM | POA: Diagnosis not present

## 2014-04-14 DIAGNOSIS — F329 Major depressive disorder, single episode, unspecified: Secondary | ICD-10-CM | POA: Diagnosis not present

## 2014-04-14 DIAGNOSIS — M6281 Muscle weakness (generalized): Secondary | ICD-10-CM | POA: Diagnosis not present

## 2014-04-14 DIAGNOSIS — F419 Anxiety disorder, unspecified: Secondary | ICD-10-CM | POA: Diagnosis not present

## 2014-04-14 DIAGNOSIS — I10 Essential (primary) hypertension: Secondary | ICD-10-CM | POA: Diagnosis not present

## 2014-04-14 DIAGNOSIS — Z993 Dependence on wheelchair: Secondary | ICD-10-CM | POA: Diagnosis not present

## 2014-04-14 DIAGNOSIS — I509 Heart failure, unspecified: Secondary | ICD-10-CM | POA: Diagnosis not present

## 2014-04-14 DIAGNOSIS — M4806 Spinal stenosis, lumbar region: Secondary | ICD-10-CM | POA: Diagnosis not present

## 2014-04-16 DIAGNOSIS — M6281 Muscle weakness (generalized): Secondary | ICD-10-CM | POA: Diagnosis not present

## 2014-04-16 DIAGNOSIS — M4806 Spinal stenosis, lumbar region: Secondary | ICD-10-CM | POA: Diagnosis not present

## 2014-04-16 DIAGNOSIS — I509 Heart failure, unspecified: Secondary | ICD-10-CM | POA: Diagnosis not present

## 2014-04-16 DIAGNOSIS — E119 Type 2 diabetes mellitus without complications: Secondary | ICD-10-CM | POA: Diagnosis not present

## 2014-04-16 DIAGNOSIS — F329 Major depressive disorder, single episode, unspecified: Secondary | ICD-10-CM | POA: Diagnosis not present

## 2014-04-16 DIAGNOSIS — I10 Essential (primary) hypertension: Secondary | ICD-10-CM | POA: Diagnosis not present

## 2014-04-17 DIAGNOSIS — M6281 Muscle weakness (generalized): Secondary | ICD-10-CM | POA: Diagnosis not present

## 2014-04-17 DIAGNOSIS — E1165 Type 2 diabetes mellitus with hyperglycemia: Secondary | ICD-10-CM | POA: Diagnosis not present

## 2014-04-17 DIAGNOSIS — F329 Major depressive disorder, single episode, unspecified: Secondary | ICD-10-CM | POA: Diagnosis not present

## 2014-04-17 DIAGNOSIS — E119 Type 2 diabetes mellitus without complications: Secondary | ICD-10-CM | POA: Diagnosis not present

## 2014-04-17 DIAGNOSIS — M4806 Spinal stenosis, lumbar region: Secondary | ICD-10-CM | POA: Diagnosis not present

## 2014-04-17 DIAGNOSIS — I10 Essential (primary) hypertension: Secondary | ICD-10-CM | POA: Diagnosis not present

## 2014-04-17 DIAGNOSIS — I509 Heart failure, unspecified: Secondary | ICD-10-CM | POA: Diagnosis not present

## 2014-04-19 DIAGNOSIS — M6281 Muscle weakness (generalized): Secondary | ICD-10-CM | POA: Diagnosis not present

## 2014-04-19 DIAGNOSIS — I509 Heart failure, unspecified: Secondary | ICD-10-CM | POA: Diagnosis not present

## 2014-04-19 DIAGNOSIS — I10 Essential (primary) hypertension: Secondary | ICD-10-CM | POA: Diagnosis not present

## 2014-04-19 DIAGNOSIS — M4806 Spinal stenosis, lumbar region: Secondary | ICD-10-CM | POA: Diagnosis not present

## 2014-04-19 DIAGNOSIS — F329 Major depressive disorder, single episode, unspecified: Secondary | ICD-10-CM | POA: Diagnosis not present

## 2014-04-19 DIAGNOSIS — E119 Type 2 diabetes mellitus without complications: Secondary | ICD-10-CM | POA: Diagnosis not present

## 2014-04-21 ENCOUNTER — Emergency Department (HOSPITAL_COMMUNITY)
Admission: EM | Admit: 2014-04-21 | Discharge: 2014-04-21 | Disposition: A | Discharge status: Home / Self Care | Payer: Medicare Other | Attending: Emergency Medicine | Admitting: Emergency Medicine

## 2014-04-21 ENCOUNTER — Encounter (HOSPITAL_COMMUNITY): Payer: Self-pay | Admitting: Emergency Medicine

## 2014-04-21 DIAGNOSIS — Z79899 Other long term (current) drug therapy: Secondary | ICD-10-CM | POA: Insufficient documentation

## 2014-04-21 DIAGNOSIS — R220 Localized swelling, mass and lump, head: Secondary | ICD-10-CM | POA: Insufficient documentation

## 2014-04-21 DIAGNOSIS — F329 Major depressive disorder, single episode, unspecified: Secondary | ICD-10-CM | POA: Insufficient documentation

## 2014-04-21 DIAGNOSIS — G2581 Restless legs syndrome: Secondary | ICD-10-CM | POA: Diagnosis not present

## 2014-04-21 DIAGNOSIS — E119 Type 2 diabetes mellitus without complications: Secondary | ICD-10-CM | POA: Diagnosis not present

## 2014-04-21 DIAGNOSIS — Z794 Long term (current) use of insulin: Secondary | ICD-10-CM | POA: Insufficient documentation

## 2014-04-21 DIAGNOSIS — R51 Headache: Secondary | ICD-10-CM | POA: Diagnosis not present

## 2014-04-21 DIAGNOSIS — F419 Anxiety disorder, unspecified: Secondary | ICD-10-CM | POA: Diagnosis not present

## 2014-04-21 DIAGNOSIS — I10 Essential (primary) hypertension: Secondary | ICD-10-CM | POA: Insufficient documentation

## 2014-04-21 DIAGNOSIS — R404 Transient alteration of awareness: Secondary | ICD-10-CM | POA: Diagnosis not present

## 2014-04-21 DIAGNOSIS — M1990 Unspecified osteoarthritis, unspecified site: Secondary | ICD-10-CM | POA: Insufficient documentation

## 2014-04-21 DIAGNOSIS — R229 Localized swelling, mass and lump, unspecified: Secondary | ICD-10-CM | POA: Diagnosis not present

## 2014-04-21 DIAGNOSIS — R531 Weakness: Secondary | ICD-10-CM | POA: Insufficient documentation

## 2014-04-21 DIAGNOSIS — Z7982 Long term (current) use of aspirin: Secondary | ICD-10-CM | POA: Diagnosis not present

## 2014-04-21 DIAGNOSIS — E780 Pure hypercholesterolemia: Secondary | ICD-10-CM | POA: Diagnosis not present

## 2014-04-21 LAB — URINALYSIS, ROUTINE W REFLEX MICROSCOPIC
Bilirubin Urine: NEGATIVE
Glucose, UA: 500 mg/dL — AB
Hgb urine dipstick: NEGATIVE
KETONES UR: NEGATIVE mg/dL
LEUKOCYTES UA: NEGATIVE
NITRITE: NEGATIVE
Protein, ur: 30 mg/dL — AB
Specific Gravity, Urine: 1.023 (ref 1.005–1.030)
Urobilinogen, UA: 1 mg/dL (ref 0.0–1.0)
pH: 6.5 (ref 5.0–8.0)

## 2014-04-21 LAB — CBC WITH DIFFERENTIAL/PLATELET
BASOS ABS: 0 10*3/uL (ref 0.0–0.1)
BASOS PCT: 0 % (ref 0–1)
Eosinophils Absolute: 0 10*3/uL (ref 0.0–0.7)
Eosinophils Relative: 0 % (ref 0–5)
HCT: 37.3 % (ref 36.0–46.0)
Hemoglobin: 12.1 g/dL (ref 12.0–15.0)
Lymphocytes Relative: 21 % (ref 12–46)
Lymphs Abs: 1.6 10*3/uL (ref 0.7–4.0)
MCH: 27.7 pg (ref 26.0–34.0)
MCHC: 32.4 g/dL (ref 30.0–36.0)
MCV: 85.4 fL (ref 78.0–100.0)
Monocytes Absolute: 0.4 10*3/uL (ref 0.1–1.0)
Monocytes Relative: 5 % (ref 3–12)
NEUTROS PCT: 74 % (ref 43–77)
Neutro Abs: 5.7 10*3/uL (ref 1.7–7.7)
PLATELETS: 220 10*3/uL (ref 150–400)
RBC: 4.37 MIL/uL (ref 3.87–5.11)
RDW: 14 % (ref 11.5–15.5)
WBC: 7.8 10*3/uL (ref 4.0–10.5)

## 2014-04-21 LAB — COMPREHENSIVE METABOLIC PANEL
ALK PHOS: 79 U/L (ref 39–117)
ALT: 14 U/L (ref 0–35)
AST: 17 U/L (ref 0–37)
Albumin: 3.7 g/dL (ref 3.5–5.2)
Anion gap: 10 (ref 5–15)
BUN: 34 mg/dL — ABNORMAL HIGH (ref 6–23)
CHLORIDE: 105 mmol/L (ref 96–112)
CO2: 26 mmol/L (ref 19–32)
Calcium: 9.6 mg/dL (ref 8.4–10.5)
Creatinine, Ser: 0.91 mg/dL (ref 0.50–1.10)
GFR, EST AFRICAN AMERICAN: 70 mL/min — AB (ref >90)
GFR, EST NON AFRICAN AMERICAN: 60 mL/min — AB (ref >90)
GLUCOSE: 244 mg/dL — AB (ref 70–99)
Potassium: 4 mmol/L (ref 3.5–5.1)
Sodium: 141 mmol/L (ref 135–145)
Total Bilirubin: 0.6 mg/dL (ref 0.3–1.2)
Total Protein: 6.7 g/dL (ref 6.0–8.3)

## 2014-04-21 LAB — URINE MICROSCOPIC-ADD ON

## 2014-04-21 LAB — I-STAT TROPONIN, ED: TROPONIN I, POC: 0 ng/mL (ref 0.00–0.08)

## 2014-04-21 MED ORDER — FAMOTIDINE 20 MG PO TABS
40.0000 mg | ORAL_TABLET | Freq: Once | ORAL | Status: AC
  Administered 2014-04-21: 40 mg via ORAL
  Filled 2014-04-21: qty 2

## 2014-04-21 MED ORDER — FAMOTIDINE 20 MG PO TABS: 20.0000 mg | ORAL_TABLET | Freq: Two times a day (BID) | ORAL | Status: DC

## 2014-04-21 MED ORDER — PRAMIPEXOLE DIHYDROCHLORIDE 0.25 MG PO TABS
0.2500 mg | ORAL_TABLET | Freq: Three times a day (TID) | ORAL | Status: DC
  Administered 2014-04-21: 0.25 mg via ORAL
  Filled 2014-04-21 (×3): qty 1

## 2014-04-21 MED ORDER — PREDNISONE 20 MG PO TABS: ORAL_TABLET | ORAL | Status: DC

## 2014-04-21 MED ORDER — ZINC OXIDE 11.3 % EX CREA: 1.0000 "application " | TOPICAL_CREAM | Freq: Two times a day (BID) | CUTANEOUS | Status: DC

## 2014-04-21 MED ORDER — DIPHENHYDRAMINE HCL 25 MG PO TABS: 25.0000 mg | ORAL_TABLET | Freq: Four times a day (QID) | ORAL | Status: DC

## 2014-04-21 MED ORDER — DIPHENHYDRAMINE HCL 25 MG PO CAPS
25.0000 mg | ORAL_CAPSULE | Freq: Once | ORAL | Status: AC
  Administered 2014-04-21: 25 mg via ORAL
  Filled 2014-04-21: qty 1

## 2014-04-21 MED ORDER — PREDNISONE 20 MG PO TABS
20.0000 mg | ORAL_TABLET | Freq: Every day | ORAL | Status: DC
  Administered 2014-04-21: 20 mg via ORAL
  Filled 2014-04-21: qty 1

## 2014-04-21 NOTE — ED Notes (Signed)
Per EMS pt from [facility]; staff reports left upper left swelling last night and new weakness this morning. Neuro unremarkable.

## 2014-04-21 NOTE — ED Notes (Signed)
Bed: WA06 Expected date:  Expected time:  Means of arrival:  Comments: EMS- elderly increased weakness x 12 hrs

## 2014-04-21 NOTE — Discharge Instructions (Signed)
Weakness Weakness is a lack of strength. It may be felt all over the body (generalized) or in one specific part of the body (focal). Some causes of weakness can be serious. You may need further medical evaluation, especially if you are elderly or you have a history of immunosuppression (such as chemotherapy or HIV), kidney disease, heart disease, or diabetes. CAUSES  Weakness can be caused by many different things, including:  Infection.  Physical exhaustion.  Internal bleeding or other blood loss that results in a lack of red blood cells (anemia).  Dehydration. This cause is more common in elderly people.  Side effects or electrolyte abnormalities from medicines, such as pain medicines or sedatives.  Emotional distress, anxiety, or depression.  Circulation problems, especially severe peripheral arterial disease.  Heart disease, such as rapid atrial fibrillation, bradycardia, or heart failure.  Nervous system disorders, such as Guillain-Barr syndrome, multiple sclerosis, or stroke. DIAGNOSIS  To find the cause of your weakness, your caregiver will take your history and perform a physical exam. Lab tests or X-rays may also be ordered, if needed. TREATMENT  Treatment of weakness depends on the cause of your symptoms and can vary greatly. HOME CARE INSTRUCTIONS   Rest as needed.  Eat a well-balanced diet.  Try to get some exercise every day.  Only take over-the-counter or prescription medicines as directed by your caregiver. SEEK MEDICAL CARE IF:   Your weakness seems to be getting worse or spreads to other parts of your body.  You develop new aches or pains. SEEK IMMEDIATE MEDICAL CARE IF:   You cannot perform your normal daily activities, such as getting dressed and feeding yourself.  You cannot walk up and down stairs, or you feel exhausted when you do so.  You have shortness of breath or chest pain.  You have difficulty moving parts of your body.  You have weakness  in only one area of the body or on only one side of the body.  You have a fever.  You have trouble speaking or swallowing.  You cannot control your bladder or bowel movements.  You have black or bloody vomit or stools. MAKE SURE YOU:  Understand these instructions.  Will watch your condition.  Will get help right away if you are not doing well or get worse. Document Released: 02/07/2005 Document Revised: 08/09/2011 Document Reviewed: 04/08/2011 [facility] Patient Information 2015 [place], [place]. This information is not intended to replace advice given to you by your health care provider. Make sure you discuss any questions you have with your health care provider.   Anaphylactic Reaction An anaphylactic reaction is a sudden, severe allergic reaction that involves the whole body. It can be life threatening. A hospital stay is often required. People with asthma, eczema, or hay fever are slightly more likely to have an anaphylactic reaction. CAUSES  An anaphylactic reaction may be caused by anything to which you are allergic. After being exposed to the allergic substance, your immune system becomes sensitized to it. When you are exposed to that allergic substance again, an allergic reaction can occur. Common causes of an anaphylactic reaction include:  Medicines.  Foods, especially peanuts, wheat, shellfish, milk, and eggs.  Insect bites or stings.  Blood products.  Chemicals, such as dyes, latex, and contrast material used for imaging tests. SYMPTOMS  When an allergic reaction occurs, the body releases histamine and other substances. These substances cause symptoms such as tightening of the airway. Symptoms often develop within seconds or minutes of exposure. Symptoms  may include:  Skin rash or hives.  Itching.  Chest tightness.  Swelling of the eyes, tongue, or lips.  Trouble breathing or swallowing.  Lightheadedness or fainting.  Anxiety or confusion.  Stomach pains,  vomiting, or diarrhea.  Nasal congestion.  A fast or irregular heartbeat (palpitations). DIAGNOSIS  Diagnosis is based on your history of recent exposure to allergic substances, your symptoms, and a physical exam. Your caregiver may also perform blood or urine tests to confirm the diagnosis. TREATMENT  Epinephrine medicine is the main treatment for an anaphylactic reaction. Other medicines that may be used for treatment include antihistamines, steroids, and albuterol. In severe cases, fluids and medicine to support blood pressure may be given through an intravenous line (IV). Even if you improve after treatment, you need to be observed to make sure your condition does not get worse. This may require a stay in the hospital. [place] a medical alert bracelet or necklace stating your allergy.  You and your family must learn how to use an anaphylaxis kit or give an epinephrine injection to temporarily treat an emergency allergic reaction. Always carry your epinephrine injection or anaphylaxis kit with you. This can be lifesaving if you have a severe reaction.  Do not drive or perform tasks after treatment until the medicines used to treat your reaction have worn off, or until your caregiver says it is okay.  If you have hives or a rash:  Take medicines as directed by your caregiver.  You may use an over-the-counter antihistamine (diphenhydramine) as needed.  Apply cold compresses to the skin or take baths in cool water. Avoid hot baths or showers. SEEK MEDICAL CARE IF:   You develop symptoms of an allergic reaction to a new substance. Symptoms may start right away or minutes later.  You develop a rash, hives, or itching.  You develop new symptoms. SEEK IMMEDIATE MEDICAL CARE IF:   You have swelling of the mouth, difficulty breathing, or wheezing.  You have a tight feeling in your chest or throat.  You develop hives, swelling, or itching all over your body.  You  develop severe vomiting or diarrhea.  You feel faint or pass out. This is an emergency. Use your epinephrine injection or anaphylaxis kit as you have been instructed. Call your local emergency services (911 in [place] Even if you improve after the injection, you need to be examined at a hospital emergency department. MAKE SURE YOU:   Understand these instructions.  Will watch your condition.  Will get help right away if you are not doing well or get worse. Document Released: 02/07/2005 Document Revised: 02/12/2013 Document Reviewed: 05/11/2011 [facility] Patient Information 2015 [place], [place]. This information is not intended to replace advice given to you by your health care provider. Make sure you discuss any questions you have with your health care provider.

## 2014-04-21 NOTE — ED Provider Notes (Signed)
CSN: [id number]     Arrival date & time 04/21/14  1022 History   First MD Initiated Contact with Patient 04/21/14 1033     Chief Complaint  Patient presents with  . Weakness  . Facial Swelling     (Consider location/radiation/quality/duration/timing/severity/associated sxs/prior Treatment) HPI Comments: Two days of facial swelling w/o pain or itching around L upper lip, today also w/ increased generalized weakness.   Patient is a 74 y.o. female presenting with weakness. The history is provided by the patient. No language interpreter was used.  Weakness This is a recurrent problem. The current episode started 1 to 2 hours ago. The problem has not changed since onset.Pertinent negatives include no chest pain, no abdominal pain, no headaches and no shortness of breath. Nothing aggravates the symptoms. Nothing relieves the symptoms. She has tried nothing for the symptoms. The treatment provided no relief.    Past Medical History  Diagnosis Date  . Anxiety   . Dyspnea   . Diabetes mellitus   . Osteoarthritis   . Hypercholesteremia   . Depression   . RLS (restless legs syndrome)   . Stress   . PONV (postoperative nausea and vomiting)   . Headache(784.0)   . HTN (hypertension)     dr Johnsie Cancel   Past Surgical History  Procedure Laterality Date  . Colonoscopy    . Abdominal hysterectomy    . Lumbar laminectomy/decompression microdiscectomy N/A 04/18/2012    Procedure: LUMBAR LAMINECTOMY/DECOMPRESSION MICRODISCECTOMY 3 LEVELS;  Surgeon: Winfield Cunas, MD;  Location: [place] [facility];  Service: Neurosurgery;  Laterality: N/A;  Lumbar three-four, lumbar four-five, lumbar five-sacral one decompression. Synovial cyst resection lumbar four-five  . Joint replacement    . Left forearm fracture with orif Left   . Replacement total knee      right   Family History  Problem Relation Age of Onset  . Diabetes Father   . High blood pressure Father   . High blood pressure Mother    History   Substance Use Topics  . Smoking status: Never Smoker   . Smokeless tobacco: Never Used  . Alcohol Use: No   OB History    No data available     Review of Systems  Constitutional: Negative for fever, chills, diaphoresis, activity change, appetite change and fatigue.  HENT: Positive for facial swelling. Negative for congestion, rhinorrhea and sore throat.   Eyes: Negative for photophobia and discharge.  Respiratory: Negative for cough, chest tightness and shortness of breath.   Cardiovascular: Negative for chest pain, palpitations and leg swelling.  Gastrointestinal: Negative for nausea, vomiting, abdominal pain and diarrhea.  Endocrine: Negative for polydipsia and polyuria.  Genitourinary: Negative for dysuria, frequency, difficulty urinating and pelvic pain.  Musculoskeletal: Negative for back pain, arthralgias, neck pain and neck stiffness.  Skin: Negative for color change and wound.  Allergic/Immunologic: Negative for immunocompromised state.  Neurological: Positive for weakness. Negative for facial asymmetry, numbness and headaches.  Hematological: Does not bruise/bleed easily.  Psychiatric/Behavioral: Negative for confusion and agitation.      Allergies  Morphine and related; Oxycodone; and Sulfonamide derivatives  Home Medications   Prior to Admission medications   Medication Sig Start Date End Date Taking? Authorizing Provider  acetaminophen (TYLENOL) 500 MG tablet Take 500 mg by mouth every 4 (four) hours as needed for mild pain, fever or headache (99.5 to 101).    Yes Historical Provider, MD  ALPRAZolam Duanne Moron) 0.5 MG tablet Take 0.5 mg by mouth at bedtime.  Yes Historical Provider, MD  alum & mag hydroxide-simeth (MAALOX/MYLANTA) 200-200-20 MG/5ML suspension Take 30 mLs by mouth every 6 (six) hours as needed for indigestion or heartburn. Do not exceed 4 doses in 24 hours   Yes Historical Provider, MD  aspirin EC 81 MG tablet Take 81 mg by mouth daily.   Yes  Historical Provider, MD  B Complex Vitamins (B-COMPLEX/B-12 PO) Take 1 tablet by mouth daily.    Yes Historical Provider, MD  bisacodyl (DULCOLAX) 10 MG suppository Place 10 mg rectally as needed for moderate constipation.   Yes Historical Provider, MD  cholecalciferol (VITAMIN D) 1000 UNITS tablet Take 1,000 Units by mouth daily.   Yes Historical Provider, MD  Cranberry 500 MG CAPS Take 1,000 mg by mouth daily at 12 noon.   Yes Historical Provider, MD  glipiZIDE (GLUCOTROL XL) 5 MG 24 hr tablet Take 5 mg by mouth daily with breakfast.    Yes Historical Provider, MD  guaifenesin (ROBITUSSIN) 100 MG/5ML syrup Take 200 mg by mouth every 6 (six) hours as needed for cough. Do not exceed 4 doses in 24 hours   Yes Historical Provider, MD  HYDROcodone-acetaminophen (NORCO/VICODIN) 5-325 MG per tablet Take 1 tablet by mouth every 8 (eight) hours as needed for moderate pain (pain). Do not exceed 3 doses in 24 hours   Yes Historical Provider, MD  insulin detemir (LEVEMIR) 100 UNIT/ML injection Inject 10 Units into the skin daily. Just before lunch   Yes Historical Provider, MD  loperamide (IMODIUM) 2 MG capsule Take 2 mg by mouth as needed for diarrhea or loose stools. Do not exceed 8 doses in 24 hours   Yes Historical Provider, MD  losartan (COZAAR) 100 MG tablet Take 100 mg by mouth daily.   Yes Historical Provider, MD  magnesium hydroxide (MILK OF MAGNESIA) 400 MG/5ML suspension Take 30 mLs by mouth daily as needed for mild constipation or moderate constipation. 07/26/13  Yes [name], MD  Magnesium Oxide 500 MG TABS Take 1 tablet by mouth daily.   Yes Historical Provider, MD  metFORMIN (GLUCOPHAGE) 500 MG tablet Take 500 mg by mouth 2 (two) times daily with a meal.   Yes Historical Provider, MD  metoCLOPramide (REGLAN) 10 MG tablet Take 10 mg by mouth 4 (four) times daily -  before meals and at bedtime. Give one tablet by mouth before meals and at bedtime for nausea and vomitting. Give 30 min befre  meals.   Yes Historical Provider, MD  metoprolol (TOPROL-XL) 100 MG 24 hr tablet Take 100 mg by mouth daily.     Yes Historical Provider, MD  neomycin-bacitracin-polymyxin (NEOSPORIN) [phone] ointment Apply 1 application topically 4 (four) times daily as needed (skin tears, abrasions, minor irritations).   Yes Historical Provider, MD  omeprazole (PRILOSEC) 40 MG capsule Take 40 mg by mouth daily.   Yes Historical Provider, MD  ondansetron (ZOFRAN) 4 MG tablet Take 4 mg by mouth 3 (three) times daily.    Yes Historical Provider, MD  polyethylene glycol (MIRALAX / GLYCOLAX) packet Take 17 g by mouth daily. 07/26/13  Yes [name], MD  potassium chloride SA (K-DUR,KLOR-CON) 20 MEQ tablet Take 40 mEq by mouth daily.    Yes Historical Provider, MD  Pramipexole Dihydrochloride 0.375 MG TB24 Take 1 tablet by mouth 3 (three) times daily.   Yes Historical Provider, MD  senna-docusate (SENOKOT-S) 8.6-50 MG per tablet Take 1 tablet by mouth at bedtime as needed for mild constipation. 07/26/13  Yes [name], MD  sertraline (ZOLOFT) 50 MG tablet Take 50 mg by mouth daily.   Yes Historical Provider, MD  temazepam (RESTORIL) 7.5 MG capsule Take 7.5 mg by mouth at bedtime as needed for sleep.   Yes Historical Provider, MD  traZODone (DESYREL) 50 MG tablet Take 50 mg by mouth at bedtime as needed for sleep.   Yes Historical Provider, MD  UNABLE TO FIND Take 1 Can by mouth 4 (four) times daily. Mighty Shakes   Yes Historical Provider, MD  verapamil (CALAN-SR) 240 MG CR tablet Take 1 tablet (240 mg total) by mouth daily. 07/26/13  Yes [name], MD  diphenhydrAMINE (BENADRYL) 25 MG tablet Take 1 tablet (25 mg total) by mouth every 6 (six) hours. 04/21/14   Ernestina Patches, MD  famotidine (PEPCID) 20 MG tablet Take 1 tablet (20 mg total) by mouth 2 (two) times daily. 04/21/14   Ernestina Patches, MD  magnesium chloride (SLOW-MAG) 64 MG TBEC SR tablet Take 1 tablet (64 mg total) by mouth daily. Patient not  taking: Reported on 04/21/2014 01/29/14   Everlene Balls, MD  pramipexole (MIRAPEX) 0.25 MG tablet Take 0.375 mg by mouth at bedtime. Morning and night.    Historical Provider, MD  predniSONE (DELTASONE) 20 MG tablet 2 po daily x 3 days 04/21/14   Ernestina Patches, MD  zinc oxide (BALMEX) 11.3 % CREA cream Apply 1 application topically 2 (two) times daily. To rash 04/21/14   Ernestina Patches, MD   BP 131/76 mmHg  Pulse 65  Temp(Src) 98.5 F (36.9 C) (Oral)  Resp 24  SpO2 94% Physical Exam  Constitutional: She is oriented to person, place, and time. She appears well-developed and well-nourished. No distress.  HENT:  Head: Normocephalic and atraumatic.  Mouth/Throat: No oropharyngeal exudate.  Eyes: Pupils are equal, round, and reactive to light.  Neck: Normal range of motion. Neck supple.  Cardiovascular: Normal rate, regular rhythm and normal heart sounds.  Exam reveals no gallop and no friction rub.   No murmur heard. Pulmonary/Chest: Effort normal and breath sounds normal. No respiratory distress. She has no wheezes. She has no rales.  Abdominal: Soft. Bowel sounds are normal. She exhibits no distension and no mass. There is no tenderness. There is no rebound and no guarding.  Musculoskeletal: Normal range of motion. She exhibits no edema or tenderness.  Neurological: She is alert and oriented to person, place, and time.  RUE 4/5, unchanged per pt.   Skin: Skin is warm and dry.  Psychiatric: She has a normal mood and affect.    ED Course  Procedures (including critical care time) Labs Review Labs Reviewed  COMPREHENSIVE METABOLIC PANEL - Abnormal; Notable for the following:    Glucose, Bld 244 (*)    BUN 34 (*)    GFR calc non Af Amer 60 (*)    GFR calc Af Amer 70 (*)    All other components within normal limits  URINALYSIS, ROUTINE W REFLEX MICROSCOPIC - Abnormal; Notable for the following:    APPearance CLOUDY (*)    Glucose, UA 500 (*)    Protein, ur 30 (*)    All other  components within normal limits  URINE MICROSCOPIC-ADD ON - Abnormal; Notable for the following:    Bacteria, UA MANY (*)    All other components within normal limits  CBC WITH DIFFERENTIAL/PLATELET  I-STAT TROPOININ, ED    Imaging Review No results found.   EKG Interpretation   Date/Time:  Monday April 21 2014 10:36:30 EST Ventricular Rate:  45 PR Interval:    QRS Duration: 82 QT Interval:  469 QTC Calculation: 531 R Axis:   -46 Text Interpretation:  Accelerated junctional rhythm LAD, consider left  anterior fascicular block Left ventricular hypertrophy Repol abnrm, severe  global ischemia (LM/MVD) Prolonged QT interval Confirmed by Kyng Matlock  MD,  Janelis Stelzer ([id number]) on 04/21/2014 11:03:10 AM      MDM   Final diagnoses:  Left facial swelling  Generalized weakness    Pt is a 74 y.o. female with Pmhx as above who presents with 2 days of swelling of the upper L lip and 1 day of increased generalized weakness. She has a shallow ulceration on mucosal surface of upper lip on left side, no signs of abscess, cellulitis, and no dental pain, no oropharyngeal edema..  She denies numbness, weakness, CP, SOB, ab pain. She has mild RLE weakness which she states is unchanged, otherwise no focal neuro findings.  No new medications, no ACE-I use.   No acute changes in CBC, CMP, UA, trop. I am unsure if facial sweeling due to ulceration or allergic reaction, though doubt infection or angioedema. Pt will be treated with 3 d of benadrl, decadron, zantac.    [name] Hand evaluation in the Emergency Department is complete. It has been determined that no acute conditions requiring further emergency intervention are present at this time. The patient/guardian have been advised of the diagnosis and plan. We have discussed signs and symptoms that warrant return to the ED, such as changes or worsening in symptoms, worsening swelling, development of fever, redness.       Ernestina Patches,  MD 04/23/14 [phone]

## 2014-04-21 NOTE — ED Notes (Addendum)
Pt has canker sore to left upper lip with swelling noted around site. Staff report new weakness this am. On assessment right leg drift; pt states this is normal; right leg weaker post knee surgery.  Upon assessment pt assisted with bedpan per request to void. Post attempt 2 drops of output with strong odor. Pericare complete.

## 2014-04-21 NOTE — ED Notes (Signed)
Pt given diet coke and [place] sandwich per request and MD okay.

## 2014-04-22 DIAGNOSIS — E119 Type 2 diabetes mellitus without complications: Secondary | ICD-10-CM | POA: Diagnosis not present

## 2014-04-22 DIAGNOSIS — I509 Heart failure, unspecified: Secondary | ICD-10-CM | POA: Diagnosis not present

## 2014-04-22 DIAGNOSIS — R600 Localized edema: Secondary | ICD-10-CM | POA: Diagnosis not present

## 2014-04-22 DIAGNOSIS — M4806 Spinal stenosis, lumbar region: Secondary | ICD-10-CM | POA: Diagnosis not present

## 2014-04-22 DIAGNOSIS — F329 Major depressive disorder, single episode, unspecified: Secondary | ICD-10-CM | POA: Diagnosis not present

## 2014-04-22 DIAGNOSIS — G2581 Restless legs syndrome: Secondary | ICD-10-CM | POA: Diagnosis not present

## 2014-04-22 DIAGNOSIS — E1165 Type 2 diabetes mellitus with hyperglycemia: Secondary | ICD-10-CM | POA: Diagnosis not present

## 2014-04-22 DIAGNOSIS — M6281 Muscle weakness (generalized): Secondary | ICD-10-CM | POA: Diagnosis not present

## 2014-04-22 DIAGNOSIS — F5101 Primary insomnia: Secondary | ICD-10-CM | POA: Diagnosis not present

## 2014-04-22 DIAGNOSIS — I10 Essential (primary) hypertension: Secondary | ICD-10-CM | POA: Diagnosis not present

## 2014-04-23 DIAGNOSIS — M6281 Muscle weakness (generalized): Secondary | ICD-10-CM | POA: Diagnosis not present

## 2014-04-23 DIAGNOSIS — I509 Heart failure, unspecified: Secondary | ICD-10-CM | POA: Diagnosis not present

## 2014-04-23 DIAGNOSIS — M4806 Spinal stenosis, lumbar region: Secondary | ICD-10-CM | POA: Diagnosis not present

## 2014-04-23 DIAGNOSIS — I10 Essential (primary) hypertension: Secondary | ICD-10-CM | POA: Diagnosis not present

## 2014-04-23 DIAGNOSIS — E119 Type 2 diabetes mellitus without complications: Secondary | ICD-10-CM | POA: Diagnosis not present

## 2014-04-23 DIAGNOSIS — F329 Major depressive disorder, single episode, unspecified: Secondary | ICD-10-CM | POA: Diagnosis not present

## 2014-04-24 DIAGNOSIS — M6281 Muscle weakness (generalized): Secondary | ICD-10-CM | POA: Diagnosis not present

## 2014-04-24 DIAGNOSIS — E119 Type 2 diabetes mellitus without complications: Secondary | ICD-10-CM | POA: Diagnosis not present

## 2014-04-24 DIAGNOSIS — I10 Essential (primary) hypertension: Secondary | ICD-10-CM | POA: Diagnosis not present

## 2014-04-24 DIAGNOSIS — I509 Heart failure, unspecified: Secondary | ICD-10-CM | POA: Diagnosis not present

## 2014-04-24 DIAGNOSIS — F329 Major depressive disorder, single episode, unspecified: Secondary | ICD-10-CM | POA: Diagnosis not present

## 2014-04-24 DIAGNOSIS — M4806 Spinal stenosis, lumbar region: Secondary | ICD-10-CM | POA: Diagnosis not present

## 2014-04-29 DIAGNOSIS — I509 Heart failure, unspecified: Secondary | ICD-10-CM | POA: Diagnosis not present

## 2014-04-29 DIAGNOSIS — M6281 Muscle weakness (generalized): Secondary | ICD-10-CM | POA: Diagnosis not present

## 2014-04-29 DIAGNOSIS — F329 Major depressive disorder, single episode, unspecified: Secondary | ICD-10-CM | POA: Diagnosis not present

## 2014-04-29 DIAGNOSIS — E119 Type 2 diabetes mellitus without complications: Secondary | ICD-10-CM | POA: Diagnosis not present

## 2014-04-29 DIAGNOSIS — M4806 Spinal stenosis, lumbar region: Secondary | ICD-10-CM | POA: Diagnosis not present

## 2014-04-29 DIAGNOSIS — I10 Essential (primary) hypertension: Secondary | ICD-10-CM | POA: Diagnosis not present

## 2014-04-30 DIAGNOSIS — F329 Major depressive disorder, single episode, unspecified: Secondary | ICD-10-CM | POA: Diagnosis not present

## 2014-04-30 DIAGNOSIS — I509 Heart failure, unspecified: Secondary | ICD-10-CM | POA: Diagnosis not present

## 2014-04-30 DIAGNOSIS — M4806 Spinal stenosis, lumbar region: Secondary | ICD-10-CM | POA: Diagnosis not present

## 2014-04-30 DIAGNOSIS — I10 Essential (primary) hypertension: Secondary | ICD-10-CM | POA: Diagnosis not present

## 2014-04-30 DIAGNOSIS — M6281 Muscle weakness (generalized): Secondary | ICD-10-CM | POA: Diagnosis not present

## 2014-04-30 DIAGNOSIS — E119 Type 2 diabetes mellitus without complications: Secondary | ICD-10-CM | POA: Diagnosis not present

## 2014-05-02 DIAGNOSIS — M6281 Muscle weakness (generalized): Secondary | ICD-10-CM | POA: Diagnosis not present

## 2014-05-02 DIAGNOSIS — F329 Major depressive disorder, single episode, unspecified: Secondary | ICD-10-CM | POA: Diagnosis not present

## 2014-05-02 DIAGNOSIS — M4806 Spinal stenosis, lumbar region: Secondary | ICD-10-CM | POA: Diagnosis not present

## 2014-05-02 DIAGNOSIS — I509 Heart failure, unspecified: Secondary | ICD-10-CM | POA: Diagnosis not present

## 2014-05-02 DIAGNOSIS — I10 Essential (primary) hypertension: Secondary | ICD-10-CM | POA: Diagnosis not present

## 2014-05-02 DIAGNOSIS — E119 Type 2 diabetes mellitus without complications: Secondary | ICD-10-CM | POA: Diagnosis not present

## 2014-05-03 DIAGNOSIS — M6281 Muscle weakness (generalized): Secondary | ICD-10-CM | POA: Diagnosis not present

## 2014-05-03 DIAGNOSIS — I509 Heart failure, unspecified: Secondary | ICD-10-CM | POA: Diagnosis not present

## 2014-05-03 DIAGNOSIS — M4806 Spinal stenosis, lumbar region: Secondary | ICD-10-CM | POA: Diagnosis not present

## 2014-05-03 DIAGNOSIS — F329 Major depressive disorder, single episode, unspecified: Secondary | ICD-10-CM | POA: Diagnosis not present

## 2014-05-03 DIAGNOSIS — I10 Essential (primary) hypertension: Secondary | ICD-10-CM | POA: Diagnosis not present

## 2014-05-03 DIAGNOSIS — E119 Type 2 diabetes mellitus without complications: Secondary | ICD-10-CM | POA: Diagnosis not present

## 2014-05-06 DIAGNOSIS — F329 Major depressive disorder, single episode, unspecified: Secondary | ICD-10-CM | POA: Diagnosis not present

## 2014-05-06 DIAGNOSIS — M4806 Spinal stenosis, lumbar region: Secondary | ICD-10-CM | POA: Diagnosis not present

## 2014-05-06 DIAGNOSIS — M6281 Muscle weakness (generalized): Secondary | ICD-10-CM | POA: Diagnosis not present

## 2014-05-06 DIAGNOSIS — I10 Essential (primary) hypertension: Secondary | ICD-10-CM | POA: Diagnosis not present

## 2014-05-06 DIAGNOSIS — E119 Type 2 diabetes mellitus without complications: Secondary | ICD-10-CM | POA: Diagnosis not present

## 2014-05-06 DIAGNOSIS — E1165 Type 2 diabetes mellitus with hyperglycemia: Secondary | ICD-10-CM | POA: Diagnosis not present

## 2014-05-06 DIAGNOSIS — J309 Allergic rhinitis, unspecified: Secondary | ICD-10-CM | POA: Diagnosis not present

## 2014-05-06 DIAGNOSIS — I509 Heart failure, unspecified: Secondary | ICD-10-CM | POA: Diagnosis not present

## 2014-05-07 DIAGNOSIS — I509 Heart failure, unspecified: Secondary | ICD-10-CM | POA: Diagnosis not present

## 2014-05-07 DIAGNOSIS — M6281 Muscle weakness (generalized): Secondary | ICD-10-CM | POA: Diagnosis not present

## 2014-05-07 DIAGNOSIS — I10 Essential (primary) hypertension: Secondary | ICD-10-CM | POA: Diagnosis not present

## 2014-05-07 DIAGNOSIS — F329 Major depressive disorder, single episode, unspecified: Secondary | ICD-10-CM | POA: Diagnosis not present

## 2014-05-07 DIAGNOSIS — M4806 Spinal stenosis, lumbar region: Secondary | ICD-10-CM | POA: Diagnosis not present

## 2014-05-07 DIAGNOSIS — E119 Type 2 diabetes mellitus without complications: Secondary | ICD-10-CM | POA: Diagnosis not present

## 2014-05-09 DIAGNOSIS — M4806 Spinal stenosis, lumbar region: Secondary | ICD-10-CM | POA: Diagnosis not present

## 2014-05-09 DIAGNOSIS — I509 Heart failure, unspecified: Secondary | ICD-10-CM | POA: Diagnosis not present

## 2014-05-09 DIAGNOSIS — M6281 Muscle weakness (generalized): Secondary | ICD-10-CM | POA: Diagnosis not present

## 2014-05-09 DIAGNOSIS — I10 Essential (primary) hypertension: Secondary | ICD-10-CM | POA: Diagnosis not present

## 2014-05-09 DIAGNOSIS — F329 Major depressive disorder, single episode, unspecified: Secondary | ICD-10-CM | POA: Diagnosis not present

## 2014-05-09 DIAGNOSIS — E119 Type 2 diabetes mellitus without complications: Secondary | ICD-10-CM | POA: Diagnosis not present

## 2014-05-12 DIAGNOSIS — I10 Essential (primary) hypertension: Secondary | ICD-10-CM | POA: Diagnosis not present

## 2014-05-12 DIAGNOSIS — F329 Major depressive disorder, single episode, unspecified: Secondary | ICD-10-CM | POA: Diagnosis not present

## 2014-05-12 DIAGNOSIS — E119 Type 2 diabetes mellitus without complications: Secondary | ICD-10-CM | POA: Diagnosis not present

## 2014-05-12 DIAGNOSIS — R269 Unspecified abnormalities of gait and mobility: Secondary | ICD-10-CM | POA: Diagnosis not present

## 2014-05-12 DIAGNOSIS — Z6827 Body mass index (BMI) 27.0-27.9, adult: Secondary | ICD-10-CM | POA: Diagnosis not present

## 2014-05-12 DIAGNOSIS — E1139 Type 2 diabetes mellitus with other diabetic ophthalmic complication: Secondary | ICD-10-CM | POA: Diagnosis not present

## 2014-05-12 DIAGNOSIS — M6281 Muscle weakness (generalized): Secondary | ICD-10-CM | POA: Diagnosis not present

## 2014-05-12 DIAGNOSIS — R634 Abnormal weight loss: Secondary | ICD-10-CM | POA: Diagnosis not present

## 2014-05-12 DIAGNOSIS — I509 Heart failure, unspecified: Secondary | ICD-10-CM | POA: Diagnosis not present

## 2014-05-12 DIAGNOSIS — M4806 Spinal stenosis, lumbar region: Secondary | ICD-10-CM | POA: Diagnosis not present

## 2014-05-12 DIAGNOSIS — Z1389 Encounter for screening for other disorder: Secondary | ICD-10-CM | POA: Diagnosis not present

## 2014-05-16 DIAGNOSIS — M6281 Muscle weakness (generalized): Secondary | ICD-10-CM | POA: Diagnosis not present

## 2014-05-16 DIAGNOSIS — M4806 Spinal stenosis, lumbar region: Secondary | ICD-10-CM | POA: Diagnosis not present

## 2014-05-16 DIAGNOSIS — E119 Type 2 diabetes mellitus without complications: Secondary | ICD-10-CM | POA: Diagnosis not present

## 2014-05-16 DIAGNOSIS — I10 Essential (primary) hypertension: Secondary | ICD-10-CM | POA: Diagnosis not present

## 2014-05-16 DIAGNOSIS — I509 Heart failure, unspecified: Secondary | ICD-10-CM | POA: Diagnosis not present

## 2014-05-16 DIAGNOSIS — F329 Major depressive disorder, single episode, unspecified: Secondary | ICD-10-CM | POA: Diagnosis not present

## 2014-05-20 ENCOUNTER — Emergency Department (HOSPITAL_COMMUNITY)
Admission: EM | Admit: 2014-05-20 | Discharge: 2014-05-20 | Disposition: A | Discharge status: Home / Self Care | Payer: Medicare Other | Attending: Emergency Medicine | Admitting: Emergency Medicine

## 2014-05-20 ENCOUNTER — Emergency Department (HOSPITAL_COMMUNITY): Payer: Medicare Other

## 2014-05-20 ENCOUNTER — Encounter (HOSPITAL_COMMUNITY): Payer: Self-pay | Admitting: *Deleted

## 2014-05-20 DIAGNOSIS — R4182 Altered mental status, unspecified: Secondary | ICD-10-CM | POA: Diagnosis not present

## 2014-05-20 DIAGNOSIS — E119 Type 2 diabetes mellitus without complications: Secondary | ICD-10-CM | POA: Insufficient documentation

## 2014-05-20 DIAGNOSIS — Z7982 Long term (current) use of aspirin: Secondary | ICD-10-CM | POA: Insufficient documentation

## 2014-05-20 DIAGNOSIS — G2581 Restless legs syndrome: Secondary | ICD-10-CM | POA: Insufficient documentation

## 2014-05-20 DIAGNOSIS — R112 Nausea with vomiting, unspecified: Secondary | ICD-10-CM | POA: Diagnosis present

## 2014-05-20 DIAGNOSIS — F4489 Other dissociative and conversion disorders: Secondary | ICD-10-CM | POA: Diagnosis not present

## 2014-05-20 DIAGNOSIS — I10 Essential (primary) hypertension: Secondary | ICD-10-CM | POA: Insufficient documentation

## 2014-05-20 DIAGNOSIS — N390 Urinary tract infection, site not specified: Secondary | ICD-10-CM | POA: Diagnosis not present

## 2014-05-20 DIAGNOSIS — I6789 Other cerebrovascular disease: Secondary | ICD-10-CM | POA: Diagnosis not present

## 2014-05-20 DIAGNOSIS — J40 Bronchitis, not specified as acute or chronic: Secondary | ICD-10-CM | POA: Diagnosis not present

## 2014-05-20 DIAGNOSIS — R410 Disorientation, unspecified: Secondary | ICD-10-CM | POA: Diagnosis not present

## 2014-05-20 DIAGNOSIS — R000 Tachycardia, unspecified: Secondary | ICD-10-CM | POA: Diagnosis not present

## 2014-05-20 DIAGNOSIS — M1990 Unspecified osteoarthritis, unspecified site: Secondary | ICD-10-CM | POA: Diagnosis not present

## 2014-05-20 DIAGNOSIS — Z79899 Other long term (current) drug therapy: Secondary | ICD-10-CM | POA: Insufficient documentation

## 2014-05-20 DIAGNOSIS — F99 Mental disorder, not otherwise specified: Secondary | ICD-10-CM | POA: Diagnosis not present

## 2014-05-20 DIAGNOSIS — I517 Cardiomegaly: Secondary | ICD-10-CM | POA: Diagnosis not present

## 2014-05-20 LAB — I-STAT CHEM 8, ED
BUN: 34 mg/dL — ABNORMAL HIGH (ref 6–23)
CREATININE: 1 mg/dL (ref 0.50–1.10)
Calcium, Ion: 1.26 mmol/L (ref 1.13–1.30)
Chloride: 104 mmol/L (ref 96–112)
Glucose, Bld: 160 mg/dL — ABNORMAL HIGH (ref 70–99)
HEMATOCRIT: 39 % (ref 36.0–46.0)
Hemoglobin: 13.3 g/dL (ref 12.0–15.0)
Potassium: 4.4 mmol/L (ref 3.5–5.1)
Sodium: 143 mmol/L (ref 135–145)
TCO2: 26 mmol/L (ref 0–100)

## 2014-05-20 LAB — CBC WITH DIFFERENTIAL/PLATELET
BASOS PCT: 0 % (ref 0–1)
Basophils Absolute: 0 10*3/uL (ref 0.0–0.1)
Eosinophils Absolute: 0 10*3/uL (ref 0.0–0.7)
Eosinophils Relative: 0 % (ref 0–5)
HEMATOCRIT: 51.7 % — AB (ref 36.0–46.0)
Hemoglobin: 17.2 g/dL — ABNORMAL HIGH (ref 12.0–15.0)
Lymphocytes Relative: 17 % (ref 12–46)
Lymphs Abs: 0.8 10*3/uL (ref 0.7–4.0)
MCH: 27.8 pg (ref 26.0–34.0)
MCHC: 33.3 g/dL (ref 30.0–36.0)
MCV: 83.7 fL (ref 78.0–100.0)
Monocytes Absolute: 0.3 10*3/uL (ref 0.1–1.0)
Monocytes Relative: 7 % (ref 3–12)
NEUTROS PCT: 76 % (ref 43–77)
Neutro Abs: 3.3 10*3/uL (ref 1.7–7.7)
Platelets: 127 10*3/uL — ABNORMAL LOW (ref 150–400)
RBC: 6.18 MIL/uL — AB (ref 3.87–5.11)
RDW: 13.8 % (ref 11.5–15.5)
WBC: 4.3 10*3/uL (ref 4.0–10.5)

## 2014-05-20 LAB — COMPREHENSIVE METABOLIC PANEL
ALT: 13 U/L (ref 0–35)
ANION GAP: 10 (ref 5–15)
AST: 19 U/L (ref 0–37)
Albumin: 3.7 g/dL (ref 3.5–5.2)
Alkaline Phosphatase: 69 U/L (ref 39–117)
BUN: 27 mg/dL — AB (ref 6–23)
CALCIUM: 9.6 mg/dL (ref 8.4–10.5)
CO2: 27 mmol/L (ref 19–32)
Chloride: 105 mmol/L (ref 96–112)
Creatinine, Ser: 1.04 mg/dL (ref 0.50–1.10)
GFR calc non Af Amer: 51 mL/min — ABNORMAL LOW (ref >90)
GFR, EST AFRICAN AMERICAN: 59 mL/min — AB (ref >90)
GLUCOSE: 157 mg/dL — AB (ref 70–99)
Potassium: 4.2 mmol/L (ref 3.5–5.1)
SODIUM: 142 mmol/L (ref 135–145)
Total Bilirubin: 0.8 mg/dL (ref 0.3–1.2)
Total Protein: 7.1 g/dL (ref 6.0–8.3)

## 2014-05-20 LAB — URINALYSIS, ROUTINE W REFLEX MICROSCOPIC
Glucose, UA: NEGATIVE mg/dL
HGB URINE DIPSTICK: NEGATIVE
KETONES UR: NEGATIVE mg/dL
Leukocytes, UA: NEGATIVE
NITRITE: NEGATIVE
PH: 6.5 (ref 5.0–8.0)
Protein, ur: 100 mg/dL — AB
Specific Gravity, Urine: 1.022 (ref 1.005–1.030)
Urobilinogen, UA: 0.2 mg/dL (ref 0.0–1.0)

## 2014-05-20 LAB — I-STAT TROPONIN, ED: TROPONIN I, POC: 0 ng/mL (ref 0.00–0.08)

## 2014-05-20 LAB — POC OCCULT BLOOD, ED: Fecal Occult Bld: NEGATIVE

## 2014-05-20 LAB — LIPASE, BLOOD: Lipase: 23 U/L (ref 11–59)

## 2014-05-20 LAB — I-STAT CG4 LACTIC ACID, ED: Lactic Acid, Venous: 1.17 mmol/L (ref 0.5–2.0)

## 2014-05-20 LAB — URINE MICROSCOPIC-ADD ON

## 2014-05-20 MED ORDER — CEPHALEXIN 500 MG PO CAPS: 500.0000 mg | ORAL_CAPSULE | Freq: Two times a day (BID) | ORAL | Status: DC

## 2014-05-20 MED ORDER — DEXTROSE 5 % IV SOLN
1.0000 g | Freq: Once | INTRAVENOUS | Status: AC
  Administered 2014-05-20: 1 g via INTRAVENOUS
  Filled 2014-05-20: qty 10

## 2014-05-20 MED ORDER — SODIUM CHLORIDE 0.9 % IV BOLUS (SEPSIS)
500.0000 mL | Freq: Once | INTRAVENOUS | Status: AC
  Administered 2014-05-20: 500 mL via INTRAVENOUS

## 2014-05-20 NOTE — ED Notes (Signed)
ptar called 

## 2014-05-20 NOTE — Discharge Instructions (Signed)
°  Take your antibiotics as directed and to completion. You should never have any leftover antibiotics! Push fluids and stay well hydrated.   Please follow with your primary care doctor in the next 2 days for a check-up. They must obtain records for further management.   Do not hesitate to return to the Emergency Department for any new, worsening or concerning symptoms.

## 2014-05-20 NOTE — ED Notes (Signed)
Bed: WA25 Expected date:  Expected time:  Means of arrival:  Comments: EMS-N/V 

## 2014-05-20 NOTE — ED Provider Notes (Signed)
Patient seen/examined in the Emergency Department in conjunction with Midlevel Provider  Patient presents for confusion per family.  She has been confused for over 3 days Exam : awake/alert, no distress but appears slow to respond.  No arm drift.  No facial droop Plan: AMS workup pending.  Pt currently stable at this time    Ripley Fraise, MD 05/20/14 1409

## 2014-05-20 NOTE — ED Provider Notes (Signed)
CSN: [id number]     Arrival date & time 05/20/14  1132 History   First MD Initiated Contact with Patient 05/20/14 1235     Chief Complaint  Patient presents with  . Nausea  . Emesis     (Consider location/radiation/quality/duration/timing/severity/associated sxs/prior Treatment) HPI   [name] is a 74 y.o. female with past medical history significant for non-insulin-dependent diabetic, depression, hypertension nursing home residence accompanied by her daughter complaining of decreased level of alertness over the weekend and patient is forgetting visits that her daughter and husband have made to her yesterday. She had single episode of vomiting this morning, it was nonbloody and nonbilious as far as the daughter knows. It was thought that she had a urinary tract infection, UA was sent off at the nursing home that they have not got the results. She was not started on any antibiotics. Patient has no complaints, denies all pain, does not remember vomiting. Level V caveat secondary to altered mental status. Most of the history supplied by her daughter. Her daughter states that she vomited all of her a.m. pills.  Past Medical History  Diagnosis Date  . Anxiety   . Dyspnea   . Diabetes mellitus   . Osteoarthritis   . Hypercholesteremia   . Depression   . RLS (restless legs syndrome)   . Stress   . PONV (postoperative nausea and vomiting)   . Headache(784.0)   . HTN (hypertension)     dr Johnsie Cancel   Past Surgical History  Procedure Laterality Date  . Colonoscopy    . Abdominal hysterectomy    . Lumbar laminectomy/decompression microdiscectomy N/A 04/18/2012    Procedure: LUMBAR LAMINECTOMY/DECOMPRESSION MICRODISCECTOMY 3 LEVELS;  Surgeon: Winfield Cunas, MD;  Location: [place] [facility];  Service: Neurosurgery;  Laterality: N/A;  Lumbar three-four, lumbar four-five, lumbar five-sacral one decompression. Synovial cyst resection lumbar four-five  . Joint replacement    . Left forearm  fracture with orif Left   . Replacement total knee      right   Family History  Problem Relation Age of Onset  . Diabetes Father   . High blood pressure Father   . High blood pressure Mother    History  Substance Use Topics  . Smoking status: Never Smoker   . Smokeless tobacco: Never Used  . Alcohol Use: No   OB History    No data available     Review of Systems  Unable to perform ROS: Mental status change      Allergies  Morphine and related; Oxycodone; and Sulfonamide derivatives  Home Medications   Prior to Admission medications   Medication Sig Start Date End Date Taking? Authorizing Provider  acetaminophen (TYLENOL) 500 MG tablet Take 500 mg by mouth every 4 (four) hours as needed for mild pain, fever or headache (99.5 to 101).    Yes Historical Provider, MD  ALPRAZolam Duanne Moron) 0.5 MG tablet Take 0.5 mg by mouth at bedtime.    Yes Historical Provider, MD  alum & mag hydroxide-simeth (MAALOX/MYLANTA) 200-200-20 MG/5ML suspension Take 30 mLs by mouth every 6 (six) hours as needed for indigestion or heartburn. Do not exceed 4 doses in 24 hours   Yes Historical Provider, MD  aspirin 81 MG chewable tablet Chew 81 mg by mouth daily.   Yes Historical Provider, MD  B Complex Vitamins (B-COMPLEX/B-12 PO) Take 1 tablet by mouth daily.    Yes Historical Provider, MD  bisacodyl (DULCOLAX) 10 MG suppository Place 10 mg rectally as  needed for moderate constipation.   Yes Historical Provider, MD  cholecalciferol (VITAMIN D) 1000 UNITS tablet Take 1,000 Units by mouth daily.   Yes Historical Provider, MD  Cranberry 500 MG CAPS Take 1,000 mg by mouth daily at 12 noon.   Yes Historical Provider, MD  diphenhydrAMINE (BENADRYL) 25 MG tablet Take 1 tablet (25 mg total) by mouth every 6 (six) hours. 04/21/14  Yes Ernestina Patches, MD  famotidine (PEPCID) 20 MG tablet Take 1 tablet (20 mg total) by mouth 2 (two) times daily. 04/21/14  Yes Ernestina Patches, MD  glipiZIDE (GLUCOTROL XL) 5 MG 24 hr  tablet Take 5 mg by mouth daily with breakfast.    Yes Historical Provider, MD  guaifenesin (ROBITUSSIN) 100 MG/5ML syrup Take 200 mg by mouth every 6 (six) hours as needed for cough. Do not exceed 4 doses in 24 hours   Yes Historical Provider, MD  loperamide (IMODIUM) 2 MG capsule Take 2 mg by mouth as needed for diarrhea or loose stools. Do not exceed 8 doses in 24 hours   Yes Historical Provider, MD  loratadine (CLARITIN) 10 MG tablet Take 10 mg by mouth daily.   Yes Historical Provider, MD  losartan (COZAAR) 100 MG tablet Take 100 mg by mouth daily.   Yes Historical Provider, MD  magnesium hydroxide (MILK OF MAGNESIA) 400 MG/5ML suspension Take 30 mLs by mouth daily as needed for mild constipation or moderate constipation. 07/26/13  Yes [name], MD  Magnesium Oxide 500 MG TABS Take 1 tablet by mouth daily.   Yes Historical Provider, MD  metFORMIN (GLUCOPHAGE) 500 MG tablet Take 500 mg by mouth 2 (two) times daily with a meal.   Yes Historical Provider, MD  metoCLOPramide (REGLAN) 10 MG tablet Take 10 mg by mouth 4 (four) times daily -  before meals and at bedtime. Give one tablet by mouth before meals and at bedtime for nausea and vomitting. Give 30 min befre meals.   Yes Historical Provider, MD  metoCLOPramide (REGLAN) 5 MG tablet Take 5 mg by mouth 2 (two) times daily before a meal.   Yes Historical Provider, MD  metoprolol (TOPROL-XL) 100 MG 24 hr tablet Take 100 mg by mouth daily.     Yes Historical Provider, MD  neomycin-bacitracin-polymyxin (NEOSPORIN) [phone] ointment Apply 1 application topically 4 (four) times daily as needed (skin tears, abrasions, minor irritations).   Yes Historical Provider, MD  omeprazole (PRILOSEC) 40 MG capsule Take 40 mg by mouth daily.   Yes Historical Provider, MD  ondansetron (ZOFRAN) 4 MG tablet Take 4 mg by mouth 3 (three) times daily.    Yes Historical Provider, MD  polyethylene glycol (MIRALAX / GLYCOLAX) packet Take 17 g by mouth daily. 07/26/13  Yes  [name], MD  potassium chloride SA (K-DUR,KLOR-CON) 20 MEQ tablet Take 40 mEq by mouth daily.    Yes Historical Provider, MD  Pramipexole Dihydrochloride 0.375 MG TB24 Take 1 tablet by mouth 3 (three) times daily.   Yes Historical Provider, MD  senna-docusate (SENOKOT-S) 8.6-50 MG per tablet Take 1 tablet by mouth at bedtime as needed for mild constipation. 07/26/13  Yes [name], MD  sertraline (ZOLOFT) 50 MG tablet Take 50 mg by mouth daily.   Yes Historical Provider, MD  traZODone (DESYREL) 50 MG tablet Take 50 mg by mouth at bedtime as needed for sleep.   Yes Historical Provider, MD  UNABLE TO FIND Take 1 Can by mouth 4 (four) times daily. Mighty Shakes   Yes Historical  Provider, MD  verapamil (CALAN-SR) 240 MG CR tablet Take 1 tablet (240 mg total) by mouth daily. 07/26/13  Yes [name], MD  zinc oxide (BALMEX) 11.3 % CREA cream Apply 1 application topically 2 (two) times daily. To rash 04/21/14  Yes Ernestina Patches, MD  cephALEXin (KEFLEX) 500 MG capsule Take 1 capsule (500 mg total) by mouth 2 (two) times daily. 05/20/14   Jaxen Samples, PA-C  magnesium chloride (SLOW-MAG) 64 MG TBEC SR tablet Take 1 tablet (64 mg total) by mouth daily. Patient not taking: Reported on 04/21/2014 01/29/14   Everlene Balls, MD  predniSONE (DELTASONE) 20 MG tablet 2 po daily x 3 days 04/21/14   Ernestina Patches, MD   BP 150/83 mmHg  Pulse 58  Temp(Src) 98.6 F (37 C) (Oral)  Resp 18  SpO2 100% Physical Exam  Constitutional: She appears well-developed and well-nourished.  HENT:  Head: Normocephalic and atraumatic.  Mouth/Throat: Oropharynx is clear and moist.  Eyes: Conjunctivae and EOM are normal. Pupils are equal, round, and reactive to light.  Neck: Normal range of motion.  Cardiovascular: Normal rate, regular rhythm and intact distal pulses.   Pulmonary/Chest: Effort normal and breath sounds normal.  Abdominal: Soft. Bowel sounds are normal. There is no tenderness.  Musculoskeletal: She  exhibits no edema or tenderness.  Neurological: She is alert.  Alert, oriented to self, city, president. Does not know the month or day. Pupils are equal round and reactive to light, uvula is midline, positive gag reflex, patient is moving all extremities and follows simple commands.  Nursing note and vitals reviewed.   ED Course  Procedures (including critical care time) Labs Review Labs Reviewed  CBC WITH DIFFERENTIAL/PLATELET - Abnormal; Notable for the following:    RBC 6.18 (*)    Hemoglobin 17.2 (*)    HCT 51.7 (*)    Platelets 127 (*)    All other components within normal limits  COMPREHENSIVE METABOLIC PANEL - Abnormal; Notable for the following:    Glucose, Bld 157 (*)    BUN 27 (*)    GFR calc non Af Amer 51 (*)    GFR calc Af Amer 59 (*)    All other components within normal limits  URINALYSIS, ROUTINE W REFLEX MICROSCOPIC - Abnormal; Notable for the following:    Color, Urine AMBER (*)    APPearance CLOUDY (*)    Bilirubin Urine SMALL (*)    Protein, ur 100 (*)    All other components within normal limits  URINE MICROSCOPIC-ADD ON - Abnormal; Notable for the following:    Bacteria, UA MANY (*)    All other components within normal limits  I-STAT CHEM 8, ED - Abnormal; Notable for the following:    BUN 34 (*)    Glucose, Bld 160 (*)    All other components within normal limits  URINE CULTURE  CULTURE, BLOOD (ROUTINE X 2)  CULTURE, BLOOD (ROUTINE X 2)  LIPASE, BLOOD  I-STAT CG4 LACTIC ACID, ED  POC OCCULT BLOOD, ED  I-STAT TROPOININ, ED    Imaging Review Ct Head Wo Contrast  05/20/2014   CLINICAL DATA:  Confusion last 3 days  EXAM: CT HEAD WITHOUT CONTRAST  TECHNIQUE: Contiguous axial images were obtained from the base of the skull through the vertex without intravenous contrast.  COMPARISON:  01/29/2014  FINDINGS: No skull fracture is noted. Paranasal sinuses and mastoid air cells are unremarkable. There is mucosal thickening with complete opacification of the  left maxillary sinus. The mastoid air  cells are unremarkable.  No intracranial hemorrhage, mass effect or midline shift. Stable cerebral atrophy. Stable ventriculomegaly. Again noted mild periventricular and patchy subcortical white matter decreased attenuation consistent with chronic small vessel ischemic changes. No definite acute cortical infarction. No mass lesion is noted on this unenhanced scan. Atherosclerotic calcifications of carotid siphon.  IMPRESSION: 1. No acute intracranial abnormality. Stable cerebral atrophy and mild ventriculomegaly. Stable chronic white matter disease. No definite acute cortical infarction. 2. There is mucosal thickening with complete opacification of the left maxillary sinus.   Electronically Signed   By: Lahoma Crocker M.D.   On: 05/20/2014 14:58   Dg Chest Port 1 View  05/20/2014   CLINICAL DATA:  Altered mental status.  Unspecified duration.  EXAM: PORTABLE CHEST - 1 VIEW  COMPARISON:  01/29/2014.  FINDINGS: Normal heart size. Clear lung fields. Mild bronchitic change. No active infiltrates or failure. Visualized skeletal structures are unremarkable. Similar appearance to priors.  IMPRESSION: No active cardiopulmonary disease.   Electronically Signed   By: Rolla Flatten M.D.   On: 05/20/2014 13:54     EKG Interpretation   Date/Time:  Tuesday May 20 2014 12:51:58 EDT Ventricular Rate:  65 PR Interval:  105 QRS Duration: 86 QT Interval:  484 QTC Calculation: 503 R Axis:   -32 Text Interpretation:  Sinus or ectopic atrial rhythm Short PR interval LVH  with secondary repolarization abnormality Prolonged QT interval No  significant change since last tracing Confirmed by Christy Gentles  MD, DONALD  [phone]) on 05/20/2014 12:59:15 PM      MDM   Final diagnoses:  Altered mental status    Filed Vitals:   05/20/14 1144 05/20/14 1402 05/20/14 1524  BP: 147/75  150/83  Pulse: 60  58  Temp: 98.6 F (37 C) 98.6 F (37 C)   TempSrc: Oral    Resp: 16  18  SpO2: 96%   100%    Medications  sodium chloride 0.9 % bolus 500 mL (not administered)  cefTRIAXone (ROCEPHIN) 1 g in dextrose 5 % 50 mL IVPB (not administered)    [name] is a pleasant 74 y.o. female presenting with somnolence which is atypical for her in addition to emesis. Patient is oriented 3, neuro exam is nonfocal. Lactic acid is normal, BUN is mildly elevated however fecal occult blood is negative. Her hemoglobin is 17 and crit is 51, think this is likely secondary to concentration. Will bolus 500 mL.  Urine shows many bacteria, this is a cath specimen. It is nitrite and leukocyte negative, urine cultures ordered patient will be given a gram of Rocephin  CT head is negative, portable chest also negative, EKG with no acute changes  Evaluation does not show pathology that would require ongoing emergent intervention or inpatient treatment. Pt is hemodynamically stable and mentating appropriately. Discussed findings and plan with patient/guardian, who agrees with care plan. All questions answered. Return precautions discussed and outpatient follow up given.   New Prescriptions   CEPHALEXIN (KEFLEX) 500 MG CAPSULE    Take 1 capsule (500 mg total) by mouth 2 (two) times daily.         Monico Blitz, PA-C 05/20/14 [place], MD 05/21/14 [phone]

## 2014-05-20 NOTE — ED Notes (Signed)
Report called to [facility]

## 2014-05-20 NOTE — Progress Notes (Signed)
CSW met with pt at bedside. Daughter was present. Patient confirms that she comes from [facility]. Daughter informed CSW that she has been living there since the last week of July.  Patient confirms that she presents to [facility] due to N/V.   Patient informed CSW that she receives help from staff at the facility with completing her ADL's. Daughter informed CSW that the pt has fallen within the past 6 months. Daughter stated "Sometimes she does, and sometimes we can go months without" speaking of the pt's falls.  Daughter informed CSW that she and the pt's husband are to be considered the pt's primary support. Daughter informed CSW that she visits the pt daily at the facility.   Patient and daughter state that they do not have any questions.  Willette Brace [phone] ED CSW 05/20/2014 6:24 PM

## 2014-05-20 NOTE — ED Notes (Signed)
Pt came from [facility], Nausea yesterday evening, this am pt had N/V. Pt denies pain, facility provider suspects UTI, awaiting specimen results cbg 171, 20 G lt hand. 166/92, 141/89, occasional PACs on the monitor

## 2014-05-20 NOTE — ED Notes (Addendum)
Pt quiet but is A&O to self/ place situation, she is unable to recall month/year. Pt's daughter is at bedside whom stated "this is not her normal". Daughter reports patient has had decreased activity and alertness over the last  3 days.

## 2014-05-21 DIAGNOSIS — M4806 Spinal stenosis, lumbar region: Secondary | ICD-10-CM | POA: Diagnosis not present

## 2014-05-21 DIAGNOSIS — I10 Essential (primary) hypertension: Secondary | ICD-10-CM | POA: Diagnosis not present

## 2014-05-21 DIAGNOSIS — I509 Heart failure, unspecified: Secondary | ICD-10-CM | POA: Diagnosis not present

## 2014-05-21 DIAGNOSIS — F329 Major depressive disorder, single episode, unspecified: Secondary | ICD-10-CM | POA: Diagnosis not present

## 2014-05-21 DIAGNOSIS — E119 Type 2 diabetes mellitus without complications: Secondary | ICD-10-CM | POA: Diagnosis not present

## 2014-05-21 DIAGNOSIS — M6281 Muscle weakness (generalized): Secondary | ICD-10-CM | POA: Diagnosis not present

## 2014-05-21 LAB — URINE CULTURE

## 2014-05-22 ENCOUNTER — Telehealth (HOSPITAL_BASED_OUTPATIENT_CLINIC_OR_DEPARTMENT_OTHER): Payer: Self-pay | Admitting: Emergency Medicine

## 2014-05-22 NOTE — Telephone Encounter (Signed)
Post ED Visit - Positive Culture Follow-up  Culture report reviewed by antimicrobial stewardship pharmacist: []  Wes Cre[place], Pharm.D., BCPS []  Heide Guile, Pharm.D., BCPS []  Alycia Rossetti, Pharm.D., BCPS []  [place], [place].D., BCPS, AAHIVP [x]  Legrand Como, Pharm.D., BCPS, AAHIVP []  Isac Sarna, Pharm.D., BC     Urine culture Viridans Streptoccus Treated with cephalexin, organism sensitive to the same and no further patient follow-up is required at this time.  Hazle Nordmann 05/22/2014, 10:35 AM

## 2014-05-23 DIAGNOSIS — M4806 Spinal stenosis, lumbar region: Secondary | ICD-10-CM | POA: Diagnosis not present

## 2014-05-23 DIAGNOSIS — M6281 Muscle weakness (generalized): Secondary | ICD-10-CM | POA: Diagnosis not present

## 2014-05-23 DIAGNOSIS — E119 Type 2 diabetes mellitus without complications: Secondary | ICD-10-CM | POA: Diagnosis not present

## 2014-05-23 DIAGNOSIS — I10 Essential (primary) hypertension: Secondary | ICD-10-CM | POA: Diagnosis not present

## 2014-05-23 DIAGNOSIS — I509 Heart failure, unspecified: Secondary | ICD-10-CM | POA: Diagnosis not present

## 2014-05-23 DIAGNOSIS — F329 Major depressive disorder, single episode, unspecified: Secondary | ICD-10-CM | POA: Diagnosis not present

## 2014-05-26 LAB — CULTURE, BLOOD (ROUTINE X 2)
Culture: NO GROWTH
Culture: NO GROWTH

## 2014-05-27 ENCOUNTER — Emergency Department (HOSPITAL_COMMUNITY): Payer: Medicare Other

## 2014-05-27 ENCOUNTER — Inpatient Hospital Stay (HOSPITAL_COMMUNITY)
Admission: EM | Admit: 2014-05-27 | Discharge: 2014-05-31 | DRG: 871 | Disposition: A | Discharge status: Skilled Nursing Facility | Payer: Medicare Other | Attending: Internal Medicine | Admitting: Internal Medicine

## 2014-05-27 ENCOUNTER — Encounter (HOSPITAL_COMMUNITY): Payer: Self-pay

## 2014-05-27 DIAGNOSIS — R112 Nausea with vomiting, unspecified: Secondary | ICD-10-CM | POA: Diagnosis not present

## 2014-05-27 DIAGNOSIS — J690 Pneumonitis due to inhalation of food and vomit: Secondary | ICD-10-CM | POA: Diagnosis not present

## 2014-05-27 DIAGNOSIS — E876 Hypokalemia: Secondary | ICD-10-CM | POA: Diagnosis present

## 2014-05-27 DIAGNOSIS — E860 Dehydration: Secondary | ICD-10-CM | POA: Diagnosis present

## 2014-05-27 DIAGNOSIS — N390 Urinary tract infection, site not specified: Secondary | ICD-10-CM | POA: Diagnosis present

## 2014-05-27 DIAGNOSIS — Z7982 Long term (current) use of aspirin: Secondary | ICD-10-CM | POA: Diagnosis not present

## 2014-05-27 DIAGNOSIS — I10 Essential (primary) hypertension: Secondary | ICD-10-CM | POA: Diagnosis not present

## 2014-05-27 DIAGNOSIS — G9340 Encephalopathy, unspecified: Secondary | ICD-10-CM | POA: Diagnosis present

## 2014-05-27 DIAGNOSIS — Z66 Do not resuscitate: Secondary | ICD-10-CM | POA: Diagnosis present

## 2014-05-27 DIAGNOSIS — M48062 Spinal stenosis, lumbar region with neurogenic claudication: Secondary | ICD-10-CM

## 2014-05-27 DIAGNOSIS — A419 Sepsis, unspecified organism: Principal | ICD-10-CM | POA: Diagnosis present

## 2014-05-27 DIAGNOSIS — M6281 Muscle weakness (generalized): Secondary | ICD-10-CM | POA: Diagnosis not present

## 2014-05-27 DIAGNOSIS — Z792 Long term (current) use of antibiotics: Secondary | ICD-10-CM

## 2014-05-27 DIAGNOSIS — Z794 Long term (current) use of insulin: Secondary | ICD-10-CM | POA: Diagnosis not present

## 2014-05-27 DIAGNOSIS — R4182 Altered mental status, unspecified: Secondary | ICD-10-CM

## 2014-05-27 DIAGNOSIS — R1111 Vomiting without nausea: Secondary | ICD-10-CM | POA: Diagnosis not present

## 2014-05-27 DIAGNOSIS — E1165 Type 2 diabetes mellitus with hyperglycemia: Secondary | ICD-10-CM | POA: Diagnosis present

## 2014-05-27 DIAGNOSIS — E870 Hyperosmolality and hypernatremia: Secondary | ICD-10-CM | POA: Diagnosis present

## 2014-05-27 DIAGNOSIS — E11649 Type 2 diabetes mellitus with hypoglycemia without coma: Secondary | ICD-10-CM | POA: Diagnosis present

## 2014-05-27 DIAGNOSIS — F341 Dysthymic disorder: Secondary | ICD-10-CM | POA: Diagnosis not present

## 2014-05-27 DIAGNOSIS — Z515 Encounter for palliative care: Secondary | ICD-10-CM | POA: Diagnosis not present

## 2014-05-27 DIAGNOSIS — G92 Toxic encephalopathy: Secondary | ICD-10-CM | POA: Diagnosis present

## 2014-05-27 DIAGNOSIS — K2990 Gastroduodenitis, unspecified, without bleeding: Secondary | ICD-10-CM | POA: Diagnosis not present

## 2014-05-27 DIAGNOSIS — F0390 Unspecified dementia without behavioral disturbance: Secondary | ICD-10-CM | POA: Diagnosis present

## 2014-05-27 DIAGNOSIS — R339 Retention of urine, unspecified: Secondary | ICD-10-CM | POA: Diagnosis not present

## 2014-05-27 DIAGNOSIS — Z833 Family history of diabetes mellitus: Secondary | ICD-10-CM

## 2014-05-27 DIAGNOSIS — J9811 Atelectasis: Secondary | ICD-10-CM | POA: Diagnosis not present

## 2014-05-27 DIAGNOSIS — F418 Other specified anxiety disorders: Secondary | ICD-10-CM | POA: Diagnosis present

## 2014-05-27 DIAGNOSIS — E861 Hypovolemia: Secondary | ICD-10-CM | POA: Diagnosis not present

## 2014-05-27 DIAGNOSIS — E1143 Type 2 diabetes mellitus with diabetic autonomic (poly)neuropathy: Secondary | ICD-10-CM | POA: Diagnosis present

## 2014-05-27 DIAGNOSIS — G2581 Restless legs syndrome: Secondary | ICD-10-CM | POA: Diagnosis present

## 2014-05-27 DIAGNOSIS — R609 Edema, unspecified: Secondary | ICD-10-CM

## 2014-05-27 DIAGNOSIS — D72829 Elevated white blood cell count, unspecified: Secondary | ICD-10-CM | POA: Diagnosis not present

## 2014-05-27 DIAGNOSIS — L8990 Pressure ulcer of unspecified site, unspecified stage: Secondary | ICD-10-CM | POA: Diagnosis not present

## 2014-05-27 DIAGNOSIS — K219 Gastro-esophageal reflux disease without esophagitis: Secondary | ICD-10-CM | POA: Diagnosis present

## 2014-05-27 DIAGNOSIS — E7800 Pure hypercholesterolemia, unspecified: Secondary | ICD-10-CM

## 2014-05-27 DIAGNOSIS — K3184 Gastroparesis: Secondary | ICD-10-CM | POA: Diagnosis present

## 2014-05-27 DIAGNOSIS — R627 Adult failure to thrive: Secondary | ICD-10-CM | POA: Diagnosis present

## 2014-05-27 DIAGNOSIS — F0391 Unspecified dementia with behavioral disturbance: Secondary | ICD-10-CM | POA: Diagnosis not present

## 2014-05-27 DIAGNOSIS — E559 Vitamin D deficiency, unspecified: Secondary | ICD-10-CM | POA: Diagnosis not present

## 2014-05-27 DIAGNOSIS — R269 Unspecified abnormalities of gait and mobility: Secondary | ICD-10-CM | POA: Diagnosis not present

## 2014-05-27 DIAGNOSIS — Z87821 Personal history of retained foreign body fully removed: Secondary | ICD-10-CM | POA: Diagnosis present

## 2014-05-27 DIAGNOSIS — J302 Other seasonal allergic rhinitis: Secondary | ICD-10-CM | POA: Diagnosis not present

## 2014-05-27 DIAGNOSIS — R2681 Unsteadiness on feet: Secondary | ICD-10-CM

## 2014-05-27 DIAGNOSIS — R1310 Dysphagia, unspecified: Secondary | ICD-10-CM | POA: Diagnosis not present

## 2014-05-27 DIAGNOSIS — J9601 Acute respiratory failure with hypoxia: Secondary | ICD-10-CM | POA: Diagnosis not present

## 2014-05-27 DIAGNOSIS — N179 Acute kidney failure, unspecified: Secondary | ICD-10-CM | POA: Diagnosis present

## 2014-05-27 DIAGNOSIS — M1990 Unspecified osteoarthritis, unspecified site: Secondary | ICD-10-CM | POA: Diagnosis present

## 2014-05-27 DIAGNOSIS — R9431 Abnormal electrocardiogram [ECG] [EKG]: Secondary | ICD-10-CM

## 2014-05-27 DIAGNOSIS — R1314 Dysphagia, pharyngoesophageal phase: Secondary | ICD-10-CM | POA: Diagnosis not present

## 2014-05-27 DIAGNOSIS — R5383 Other fatigue: Secondary | ICD-10-CM | POA: Diagnosis not present

## 2014-05-27 DIAGNOSIS — R279 Unspecified lack of coordination: Secondary | ICD-10-CM | POA: Diagnosis not present

## 2014-05-27 DIAGNOSIS — R509 Fever, unspecified: Secondary | ICD-10-CM

## 2014-05-27 DIAGNOSIS — R1311 Dysphagia, oral phase: Secondary | ICD-10-CM | POA: Diagnosis not present

## 2014-05-27 DIAGNOSIS — E872 Acidosis: Secondary | ICD-10-CM | POA: Diagnosis present

## 2014-05-27 DIAGNOSIS — R1110 Vomiting, unspecified: Secondary | ICD-10-CM

## 2014-05-27 DIAGNOSIS — R6521 Severe sepsis with septic shock: Secondary | ICD-10-CM | POA: Diagnosis not present

## 2014-05-27 DIAGNOSIS — K2970 Gastritis, unspecified, without bleeding: Secondary | ICD-10-CM | POA: Diagnosis not present

## 2014-05-27 HISTORY — DX: Urinary tract infection, site not specified: N39.0

## 2014-05-27 LAB — COMPREHENSIVE METABOLIC PANEL
ALBUMIN: 3.5 g/dL (ref 3.5–5.2)
ALT: 14 U/L (ref 0–35)
AST: 21 U/L (ref 0–37)
Alkaline Phosphatase: 72 U/L (ref 39–117)
Anion gap: 11 (ref 5–15)
BILIRUBIN TOTAL: 0.6 mg/dL (ref 0.3–1.2)
BUN: 37 mg/dL — ABNORMAL HIGH (ref 6–23)
CALCIUM: 9.4 mg/dL (ref 8.4–10.5)
CO2: 24 mmol/L (ref 19–32)
Chloride: 111 mmol/L (ref 96–112)
Creatinine, Ser: 1.3 mg/dL — ABNORMAL HIGH (ref 0.50–1.10)
GFR calc Af Amer: 45 mL/min — ABNORMAL LOW (ref >90)
GFR, EST NON AFRICAN AMERICAN: 39 mL/min — AB (ref >90)
Glucose, Bld: 198 mg/dL — ABNORMAL HIGH (ref 70–99)
POTASSIUM: 4.9 mmol/L (ref 3.5–5.1)
SODIUM: 146 mmol/L — AB (ref 135–145)
TOTAL PROTEIN: 6.9 g/dL (ref 6.0–8.3)

## 2014-05-27 LAB — I-STAT CHEM 8, ED
BUN: 54 mg/dL — ABNORMAL HIGH (ref 6–23)
CREATININE: 1.2 mg/dL — AB (ref 0.50–1.10)
Calcium, Ion: 1.14 mmol/L (ref 1.13–1.30)
Chloride: 112 mmol/L (ref 96–112)
Glucose, Bld: 265 mg/dL — ABNORMAL HIGH (ref 70–99)
HEMATOCRIT: 39 % (ref 36.0–46.0)
HEMOGLOBIN: 13.3 g/dL (ref 12.0–15.0)
POTASSIUM: 7.1 mmol/L — AB (ref 3.5–5.1)
SODIUM: 143 mmol/L (ref 135–145)
TCO2: 23 mmol/L (ref 0–100)

## 2014-05-27 LAB — CBC WITH DIFFERENTIAL/PLATELET
Basophils Absolute: 0 10*3/uL (ref 0.0–0.1)
Basophils Relative: 0 % (ref 0–1)
Eosinophils Absolute: 0 10*3/uL (ref 0.0–0.7)
Eosinophils Relative: 0 % (ref 0–5)
HCT: 40.2 % (ref 36.0–46.0)
Hemoglobin: 12.8 g/dL (ref 12.0–15.0)
Lymphocytes Relative: 11 % — ABNORMAL LOW (ref 12–46)
Lymphs Abs: 1.3 10*3/uL (ref 0.7–4.0)
MCH: 27.5 pg (ref 26.0–34.0)
MCHC: 31.8 g/dL (ref 30.0–36.0)
MCV: 86.3 fL (ref 78.0–100.0)
Monocytes Absolute: 0.8 10*3/uL (ref 0.1–1.0)
Monocytes Relative: 6 % (ref 3–12)
Neutro Abs: 10.3 10*3/uL — ABNORMAL HIGH (ref 1.7–7.7)
Neutrophils Relative %: 83 % — ABNORMAL HIGH (ref 43–77)
Platelets: 242 10*3/uL (ref 150–400)
RBC: 4.66 MIL/uL (ref 3.87–5.11)
RDW: 14.2 % (ref 11.5–15.5)
WBC: 12.4 10*3/uL — ABNORMAL HIGH (ref 4.0–10.5)

## 2014-05-27 LAB — URINE MICROSCOPIC-ADD ON

## 2014-05-27 LAB — GLUCOSE, CAPILLARY
GLUCOSE-CAPILLARY: 142 mg/dL — AB (ref 70–99)
Glucose-Capillary: 135 mg/dL — ABNORMAL HIGH (ref 70–99)

## 2014-05-27 LAB — URINALYSIS, ROUTINE W REFLEX MICROSCOPIC
Bilirubin Urine: NEGATIVE
Glucose, UA: 500 mg/dL — AB
Hgb urine dipstick: NEGATIVE
Ketones, ur: NEGATIVE mg/dL
Leukocytes, UA: NEGATIVE
Nitrite: NEGATIVE
Protein, ur: 100 mg/dL — AB
Specific Gravity, Urine: 1.026 (ref 1.005–1.030)
Urobilinogen, UA: 0.2 mg/dL (ref 0.0–1.0)
pH: 7.5 (ref 5.0–8.0)

## 2014-05-27 LAB — I-STAT CG4 LACTIC ACID, ED
LACTIC ACID, VENOUS: 3.26 mmol/L — AB (ref 0.5–2.0)
Lactic Acid, Venous: 2.2 mmol/L (ref 0.5–2.0)

## 2014-05-27 MED ORDER — GLIPIZIDE ER 5 MG PO TB24
5.0000 mg | ORAL_TABLET | Freq: Every day | ORAL | Status: DC
  Administered 2014-05-28 – 2014-05-29 (×2): 5 mg via ORAL
  Filled 2014-05-27 (×3): qty 1

## 2014-05-27 MED ORDER — TRAZODONE HCL 50 MG PO TABS: 50.0000 mg | ORAL_TABLET | Freq: Every evening | ORAL | Status: DC | PRN

## 2014-05-27 MED ORDER — METOCLOPRAMIDE HCL 5 MG PO TABS
5.0000 mg | ORAL_TABLET | Freq: Two times a day (BID) | ORAL | Status: DC
  Administered 2014-05-28 – 2014-05-31 (×7): 5 mg via ORAL
  Filled 2014-05-27 (×9): qty 1

## 2014-05-27 MED ORDER — POLYETHYLENE GLYCOL 3350 17 G PO PACK
17.0000 g | PACK | Freq: Every day | ORAL | Status: DC
  Administered 2014-05-28: 17 g via ORAL
  Filled 2014-05-27 (×4): qty 1

## 2014-05-27 MED ORDER — SODIUM CHLORIDE 0.9 % IJ SOLN
3.0000 mL | Freq: Two times a day (BID) | INTRAMUSCULAR | Status: DC
  Administered 2014-05-27 – 2014-05-31 (×5): 3 mL via INTRAVENOUS

## 2014-05-27 MED ORDER — ALPRAZOLAM 0.5 MG PO TABS
0.5000 mg | ORAL_TABLET | Freq: Every day | ORAL | Status: DC
  Administered 2014-05-27 – 2014-05-29 (×3): 0.5 mg via ORAL
  Filled 2014-05-27 (×3): qty 1

## 2014-05-27 MED ORDER — SODIUM CHLORIDE 0.9 % IV BOLUS (SEPSIS)
500.0000 mL | Freq: Once | INTRAVENOUS | Status: AC
  Administered 2014-05-27: 500 mL via INTRAVENOUS

## 2014-05-27 MED ORDER — PANTOPRAZOLE SODIUM 40 MG PO TBEC
40.0000 mg | DELAYED_RELEASE_TABLET | Freq: Every day | ORAL | Status: DC
  Administered 2014-05-28 – 2014-05-29 (×2): 40 mg via ORAL
  Filled 2014-05-27 (×2): qty 1

## 2014-05-27 MED ORDER — ASPIRIN 81 MG PO CHEW
81.0000 mg | CHEWABLE_TABLET | Freq: Every day | ORAL | Status: DC
  Administered 2014-05-28 – 2014-05-31 (×4): 81 mg via ORAL
  Filled 2014-05-27 (×4): qty 1

## 2014-05-27 MED ORDER — INSULIN ASPART 100 UNIT/ML ~~LOC~~ SOLN
0.0000 [IU] | Freq: Every day | SUBCUTANEOUS | Status: DC
  Administered 2014-05-30: 3 [IU] via SUBCUTANEOUS

## 2014-05-27 MED ORDER — SERTRALINE HCL 50 MG PO TABS
50.0000 mg | ORAL_TABLET | Freq: Every day | ORAL | Status: DC
  Administered 2014-05-28 – 2014-05-31 (×4): 50 mg via ORAL
  Filled 2014-05-27 (×4): qty 1

## 2014-05-27 MED ORDER — INSULIN DETEMIR 100 UNIT/ML ~~LOC~~ SOLN
20.0000 [IU] | Freq: Every day | SUBCUTANEOUS | Status: DC
  Administered 2014-05-28: 20 [IU] via SUBCUTANEOUS
  Filled 2014-05-27 (×2): qty 0.2

## 2014-05-27 MED ORDER — METOPROLOL SUCCINATE ER 100 MG PO TB24
100.0000 mg | ORAL_TABLET | Freq: Every day | ORAL | Status: DC
Start: 2014-05-28 — End: 2014-05-31
  Administered 2014-05-28 – 2014-05-31 (×4): 100 mg via ORAL
  Filled 2014-05-27 (×4): qty 1

## 2014-05-27 MED ORDER — ONDANSETRON HCL 4 MG PO TABS: 4.0000 mg | ORAL_TABLET | Freq: Four times a day (QID) | ORAL | Status: DC | PRN

## 2014-05-27 MED ORDER — ENOXAPARIN SODIUM 40 MG/0.4ML ~~LOC~~ SOLN
40.0000 mg | SUBCUTANEOUS | Status: DC
  Administered 2014-05-27 – 2014-05-30 (×4): 40 mg via SUBCUTANEOUS
  Filled 2014-05-27 (×4): qty 0.4

## 2014-05-27 MED ORDER — LEVOFLOXACIN IN D5W 500 MG/100ML IV SOLN
500.0000 mg | Freq: Once | INTRAVENOUS | Status: AC
  Administered 2014-05-27: 500 mg via INTRAVENOUS
  Filled 2014-05-27: qty 100

## 2014-05-27 MED ORDER — MAGNESIUM OXIDE 400 (241.3 MG) MG PO TABS
400.0000 mg | ORAL_TABLET | Freq: Every day | ORAL | Status: DC
  Administered 2014-05-28 – 2014-05-31 (×4): 400 mg via ORAL
  Filled 2014-05-27 (×4): qty 1

## 2014-05-27 MED ORDER — ACETAMINOPHEN 650 MG RE SUPP: 650.0000 mg | Freq: Four times a day (QID) | RECTAL | Status: DC | PRN

## 2014-05-27 MED ORDER — DEXTROSE 5 % IV SOLN
1.0000 g | INTRAVENOUS | Status: DC
  Administered 2014-05-27 – 2014-05-30 (×4): 1 g via INTRAVENOUS
  Filled 2014-05-27 (×8): qty 10

## 2014-05-27 MED ORDER — SENNOSIDES-DOCUSATE SODIUM 8.6-50 MG PO TABS
1.0000 | ORAL_TABLET | Freq: Every evening | ORAL | Status: DC | PRN
Start: 2014-05-27 — End: 2014-05-31

## 2014-05-27 MED ORDER — LOSARTAN POTASSIUM 50 MG PO TABS
100.0000 mg | ORAL_TABLET | Freq: Every day | ORAL | Status: DC
  Administered 2014-05-28 – 2014-05-31 (×4): 100 mg via ORAL
  Filled 2014-05-27 (×4): qty 2

## 2014-05-27 MED ORDER — SODIUM CHLORIDE 0.9 % IV SOLN
INTRAVENOUS | Status: DC
  Administered 2014-05-27 – 2014-05-29 (×5): via INTRAVENOUS

## 2014-05-27 MED ORDER — ALUM & MAG HYDROXIDE-SIMETH 200-200-20 MG/5ML PO SUSP: 30.0000 mL | Freq: Four times a day (QID) | ORAL | Status: DC | PRN

## 2014-05-27 MED ORDER — ACETAMINOPHEN 650 MG RE SUPP
650.0000 mg | Freq: Once | RECTAL | Status: AC
  Administered 2014-05-27: 650 mg via RECTAL
  Filled 2014-05-27: qty 1

## 2014-05-27 MED ORDER — ONDANSETRON HCL 4 MG/2ML IJ SOLN
4.0000 mg | Freq: Four times a day (QID) | INTRAMUSCULAR | Status: DC | PRN
  Administered 2014-05-29: 4 mg via INTRAVENOUS

## 2014-05-27 MED ORDER — ACETAMINOPHEN 325 MG PO TABS: 650.0000 mg | ORAL_TABLET | Freq: Four times a day (QID) | ORAL | Status: DC | PRN

## 2014-05-27 MED ORDER — INSULIN ASPART 100 UNIT/ML ~~LOC~~ SOLN
0.0000 [IU] | Freq: Three times a day (TID) | SUBCUTANEOUS | Status: DC
  Administered 2014-05-27 – 2014-05-28 (×2): 2 [IU] via SUBCUTANEOUS

## 2014-05-27 NOTE — ED Notes (Signed)
Portable Xray at bedside.

## 2014-05-27 NOTE — ED Notes (Signed)
Dr Dewayne Hatch made aware of abnormal lactic.

## 2014-05-27 NOTE — Progress Notes (Signed)
UR completed 

## 2014-05-27 NOTE — ED Notes (Signed)
Bed: WA06 Expected date:  Expected time:  Means of arrival:  Comments: Ems- elderly increased drowsiness

## 2014-05-27 NOTE — ED Notes (Signed)
Dr Dewayne Hatch made aware of abnormal Potassium

## 2014-05-27 NOTE — ED Notes (Signed)
Per ems pt from facility, was see in ED on Tues, dx with infection, is on Keflex. Daughter reports pt is not acting her normal self. Normally talkative, now pt sleeping 20 hours/ day. Febrile.

## 2014-05-27 NOTE — ED Notes (Signed)
Per Nurse patient is getting a ultrasound IV so they will collect blood at that time.

## 2014-05-27 NOTE — H&P (Signed)
[facility] History and Physical  [name] WIO:[id number] DOB: [birth year] DOA: 05/27/2014  Referring physician:  PCP: [name], MD   Chief Complaint: Fever  HPI: [name] is a 74 y.o. female with a past medical history of cognitive impairment, presented as a transfer from her nursing facility to the emergency room with complaints of functional decline/failure to thrive over the past week. She was diagnosed with a urinary tract infection on 05/20/2014 as urinalysis showed the presence of many bacteria. Patient was treated with Keflex empirically. History obtained from her daughter who was present at bedside as patient unable to provide history or participate in her own plan of care. Over the course of the week she continued to decline, becoming less active, minimally verbal, having excessive daytime sleepiness, decreased by mouth intake. She has also had fevers and chills. At her facility she was found to be slumped over her food at the dining room table. Initial lab work performed in the emergency room today showed an elevated lactic acid of 3.26, white count of 12.4, found to have a temperature 101.5.                                                                                                                                                                                                    Review of Systems:  Cannot obtain a reliable review of systems given cognitive impairment and overall functional decline  Past Medical History  Diagnosis Date  . Anxiety   . Dyspnea   . Diabetes mellitus   . Osteoarthritis   . Hypercholesteremia   . Depression   . RLS (restless legs syndrome)   . Stress   . PONV (postoperative nausea and vomiting)   . Headache(784.0)   . HTN (hypertension)     dr Johnsie Cancel   Past Surgical History  Procedure Laterality Date  . Colonoscopy    . Abdominal hysterectomy    . Lumbar laminectomy/decompression microdiscectomy N/A 04/18/2012      Procedure: LUMBAR LAMINECTOMY/DECOMPRESSION MICRODISCECTOMY 3 LEVELS;  Surgeon: Winfield Cunas, MD;  Location: [facility];  Service: Neurosurgery;  Laterality: N/A;  Lumbar three-four, lumbar four-five, lumbar five-sacral one decompression. Synovial cyst resection lumbar four-five  . Joint replacement    . Left forearm fracture with orif Left   . Replacement total knee      right   Social History:  reports that she has never smoked. She has never used smokeless tobacco. She reports that she does not drink alcohol or use illicit drugs.  Allergies  Allergen Reactions  . Morphine And  Related Other (See Comments)    hallucination   . Oxycodone Other (See Comments)    hallucination   . Sulfonamide Derivatives Swelling    Family History  Problem Relation Age of Onset  . Diabetes Father   . High blood pressure Father   . High blood pressure Mother      Prior to Admission medications   Medication Sig Start Date End Date Taking? Authorizing Provider  acetaminophen (TYLENOL) 500 MG tablet Take 500 mg by mouth every 4 (four) hours as needed for mild pain, fever or headache (99.5 to 101).    Yes Historical Provider, MD  ALPRAZolam Duanne Moron) 0.5 MG tablet Take 0.5 mg by mouth at bedtime.    Yes Historical Provider, MD  alum & mag hydroxide-simeth (MAALOX/MYLANTA) 200-200-20 MG/5ML suspension Take 30 mLs by mouth every 6 (six) hours as needed for indigestion or heartburn. Do not exceed 4 doses in 24 hours   Yes Historical Provider, MD  aspirin 81 MG chewable tablet Chew 81 mg by mouth daily.   Yes Historical Provider, MD  B Complex Vitamins (B-COMPLEX/B-12 PO) Take 1 tablet by mouth daily.    Yes Historical Provider, MD  bisacodyl (DULCOLAX) 10 MG suppository Place 10 mg rectally as needed for moderate constipation.   Yes Historical Provider, MD  cephALEXin (KEFLEX) 500 MG capsule Take 1 capsule (500 mg total) by mouth 2 (two) times daily. 05/20/14  Yes Nicole Pisciotta, PA-C  cholecalciferol  (VITAMIN D) 1000 UNITS tablet Take 1,000 Units by mouth daily.   Yes Historical Provider, MD  Cranberry 500 MG CAPS Take 1,000 mg by mouth daily at 12 noon.   Yes Historical Provider, MD  glipiZIDE (GLUCOTROL XL) 5 MG 24 hr tablet Take 5 mg by mouth daily with breakfast.    Yes Historical Provider, MD  guaifenesin (ROBITUSSIN) 100 MG/5ML syrup Take 200 mg by mouth every 6 (six) hours as needed for cough. Do not exceed 4 doses in 24 hours   Yes Historical Provider, MD  insulin detemir (LEVEMIR) 100 UNIT/ML injection Inject 18 Units into the skin daily before lunch.   Yes Historical Provider, MD  loperamide (IMODIUM) 2 MG capsule Take 2 mg by mouth as needed for diarrhea or loose stools. Do not exceed 8 doses in 24 hours   Yes Historical Provider, MD  loratadine (CLARITIN) 10 MG tablet Take 10 mg by mouth daily as needed for allergies.    Yes Historical Provider, MD  losartan (COZAAR) 100 MG tablet Take 100 mg by mouth daily.   Yes Historical Provider, MD  magnesium hydroxide (MILK OF MAGNESIA) 400 MG/5ML suspension Take 30 mLs by mouth daily as needed for mild constipation or moderate constipation. 07/26/13  Yes [name], MD  Magnesium Oxide 500 MG TABS Take 1 tablet by mouth daily.   Yes Historical Provider, MD  metFORMIN (GLUCOPHAGE) 500 MG tablet Take 500 mg by mouth 2 (two) times daily with a meal.   Yes Historical Provider, MD  metoCLOPramide (REGLAN) 5 MG tablet Take 5 mg by mouth 2 (two) times daily before a meal.   Yes Historical Provider, MD  metoprolol (TOPROL-XL) 100 MG 24 hr tablet Take 100 mg by mouth daily.     Yes Historical Provider, MD  neomycin-bacitracin-polymyxin (NEOSPORIN) [phone] ointment Apply 1 application topically 4 (four) times daily as needed (skin tears, abrasions, minor irritations).   Yes Historical Provider, MD  omeprazole (PRILOSEC) 40 MG capsule Take 40 mg by mouth daily.   Yes Historical Provider, MD  ondansetron (ZOFRAN) 4 MG tablet Take 4 mg by mouth 3  (three) times daily.    Yes Historical Provider, MD  polyethylene glycol (MIRALAX / GLYCOLAX) packet Take 17 g by mouth daily. 07/26/13  Yes [name], MD  potassium chloride SA (K-DUR,KLOR-CON) 20 MEQ tablet Take 40 mEq by mouth daily.    Yes Historical Provider, MD  Pramipexole Dihydrochloride 0.375 MG TB24 Take 1 tablet by mouth 3 (three) times daily.   Yes Historical Provider, MD  senna-docusate (SENOKOT-S) 8.6-50 MG per tablet Take 1 tablet by mouth at bedtime as needed for mild constipation. 07/26/13  Yes [name], MD  sertraline (ZOLOFT) 50 MG tablet Take 50 mg by mouth daily.   Yes Historical Provider, MD  traZODone (DESYREL) 50 MG tablet Take 50 mg by mouth at bedtime as needed for sleep.   Yes Historical Provider, MD  UNABLE TO FIND Take 1 Can by mouth 4 (four) times daily. Mighty Shakes   Yes Historical Provider, MD  verapamil (CALAN-SR) 240 MG CR tablet Take 1 tablet (240 mg total) by mouth daily. 07/26/13  Yes [name], MD  zinc oxide (BALMEX) 11.3 % CREA cream Apply 1 application topically 2 (two) times daily. To rash 04/21/14  Yes Ernestina Patches, MD  diphenhydrAMINE (BENADRYL) 25 MG tablet Take 1 tablet (25 mg total) by mouth every 6 (six) hours. Patient not taking: Reported on 05/27/2014 04/21/14   Ernestina Patches, MD  famotidine (PEPCID) 20 MG tablet Take 1 tablet (20 mg total) by mouth 2 (two) times daily. Patient not taking: Reported on 05/27/2014 04/21/14   Ernestina Patches, MD  magnesium chloride (SLOW-MAG) 64 MG TBEC SR tablet Take 1 tablet (64 mg total) by mouth daily. Patient not taking: Reported on 04/21/2014 01/29/14   Everlene Balls, MD  predniSONE (DELTASONE) 20 MG tablet 2 po daily x 3 days Patient not taking: Reported on 05/27/2014 04/21/14   Ernestina Patches, MD   Physical Exam: Filed Vitals:   05/27/14 1230 05/27/14 1245 05/27/14 1300 05/27/14 1315  BP: 129/73 115/80 136/99 145/78  Pulse: 67 72 75 75  Temp:      TempSrc:      Resp: 15 0 23 22  Weight:      SpO2:  95% 93% 93% 95%    Wt Readings from Last 3 Encounters:  05/27/14 99.791 kg (220 lb)  07/18/13 98.9 kg (218 lb 0.6 oz)  04/29/13 94.348 kg (208 lb)    General:  Patient appears somnolent however she was arousable and did answer a few questions for me. She can follow simple commands as well. Otherwise appears ill. Eyes: PERRL, normal lids, irises & conjunctiva ENT: grossly normal hearing, lips & tongue, dry oral mucosa Neck: no LAD, masses or thyromegaly Cardiovascular: RRR, no m/r/g. No LE edema. Telemetry: SR, no arrhythmias  Respiratory: CTA bilaterally, no w/r/r. Normal respiratory effort. Abdomen: soft, ntnd Skin: no rash or induration seen on limited exam Musculoskeletal: grossly normal tone BUE/BLE Psychiatric: Patient is confused, disoriented, can't follow simple commands. Unable to provide reliable history. Neurologic: grossly non-focal.          Labs on Admission:  Basic Metabolic Panel:  Recent Labs Lab 05/27/14 1108 05/27/14 1209  NA 143 146*  K 7.1* 4.9  CL 112 111  CO2  --  24  GLUCOSE 265* 198*  BUN 54* 37*  CREATININE 1.20* 1.30*  CALCIUM  --  9.4   Liver Function Tests:  Recent Labs Lab 05/27/14 1209  AST 21  ALT 14  ALKPHOS 72  BILITOT 0.6  PROT 6.9  ALBUMIN 3.5   No results for input(s): LIPASE, AMYLASE in the last 168 hours. No results for input(s): AMMONIA in the last 168 hours. CBC:  Recent Labs Lab 05/27/14 1049 05/27/14 1108  WBC 12.4*  --   NEUTROABS 10.3*  --   HGB 12.8 13.3  HCT 40.2 39.0  MCV 86.3  --   PLT 242  --    Cardiac Enzymes: No results for input(s): CKTOTAL, CKMB, CKMBINDEX, TROPONINI in the last 168 hours.  BNP (last 3 results) No results for input(s): BNP in the last 8760 hours.  ProBNP (last 3 results) No results for input(s): PROBNP in the last 8760 hours.  CBG: No results for input(s): GLUCAP in the last 168 hours.  Radiological Exams on Admission: Dg Chest Port 1 View  05/27/2014   CLINICAL DATA:   Acute onset of lethargy 2 weeks ago, somnolence, not eating or taking medications, history diabetes, hypertension  EXAM: PORTABLE CHEST - 1 VIEW  COMPARISON:  Portable exam 1127 hours compared to 05/20/2014  FINDINGS: Normal heart size, mediastinal contours and pulmonary vascularity for technique.  Minimal atelectasis at RIGHT base.  Lungs otherwise clear.  No pleural effusion or pneumothorax.  Osseous structures unremarkable.  IMPRESSION: Minimal RIGHT basilar atelectasis.   Electronically Signed   By: Lavonia Dana M.D.   On: 05/27/2014 11:39    EKG: Independently reviewed.   Assessment/Plan Principal Problem:   Fever Active Problems:   Sepsis   UTI (lower urinary tract infection)   ANXIETY DEPRESSION   Essential hypertension   1. Sepsis. Present on admission, evidenced by a temperature of 101.5, toxic encephalopathy, lab work showing a lactate of 3.26 with white count of 12,400. I suspect source of infection to be urinary tract infection. On physical exam she was nonfocal and chest x-ray did not show evidence of pneumonia. Will obtain blood cultures and urine cultures, start empiric IV antibiotic therapy with ceftriaxone 1 g IV every 24 hours, IV fluid resuscitation. Will repeat chest x-ray in a.m. 2. Functional decline/failure to thrive. Patient with history of cognitive impairment and current resident at nursing facility, presenting with a week history of functional decline. This was likely precipitated by an infectious process and dehydration. Family members reporting that she has had minimal by mouth intake over the past week. Providing IV fluid resuscitation and empiric IV antibiotic therapy.  3. Acute kidney injury. Likely secondary to hypovolemia/prerenal azotemia as labs show an upward trend her creatinine from 1.0 obtained on 05/20/2014 to 1.3 on today's lab work. Patient was given a 1000 mL bolus of normal saline in the emergency department, will continue IV fluid hydration with normal  saline at 100 mL/hour. Reassess volume status in a.m. 4. Possible urinary tract infection. Patient was diagnosed with a UTI on 05/20/2014, treated with Keflex having ongoing fevers and functional decline. Follow-up on urine and blood cultures, will start her on ceftriaxone 1 g IV every 24 hours 5. Insulin-dependent diabetes mellitus. Patient having elevated blood sugars of 265 in the emergency department, likely related to an underlying infectious process. Will provide sliding scale coverage with Accu-Cheks. Discontinue metformin and glipizide due to developing acute kidney injury, will increase Levemir to 20 units subcutaneous daily. Will follow blood sugars along.  6. Hypertension. Patient's blood pressures are stable, will continue her home regimen with metoprolol 100 mg by mouth daily and Cozaar 100 mg by mouth daily 7. DVT prophylaxis. Subcutaneous heparin  Code Status: CODE STATUS discussed with her daughter who is her healthcare Pavich a, patient is a DO NOT RESUSCITATE Family Communication: I spoke to her daughter was present at bedside Disposition Plan: Will admit patient to telemetry, anticipate she'll require greater than 2 night hospitalization  Time spent: 70 min  Kelvin Cellar [facility] Pager [phone]

## 2014-05-27 NOTE — ED Notes (Signed)
Dr Dewayne Hatch made aware of abnormal lactic

## 2014-05-27 NOTE — Progress Notes (Signed)
CSW attempted to meet with pt at bedside. However, she was asleep. Daughter was present. Patient comes from [facility].  Daughter informed CSW that the pt was in [place] last Tuesday due to drowsiness and UTI. Today, she states that the pt presents to [facility] due to drowsiness. Daughter states " She is so sleepy she wont eat. She wont talk... Cant get her to drink anything. She just been going down hill."  Daughter states that the pt has lost a lot of weight in the past 30 days. She informed CSW that the pt receives assistance completing her ADL's. She says the pt has fallen 1 or x2 within the past 6 months.  Daughter states that she is the pt's primary support. She states that she visits the pt daily. Also, she states the pt husband lives with her and she takes him to the facility x3 or 4x a week to visit the pt. Daughter says she is the POA.  Daughter states that she does not have any questions at this time.   [name] [place]/Daughter [phone]  Willette Brace [phone] ED CSW 05/27/2014 4:43 PM

## 2014-05-27 NOTE — ED Provider Notes (Signed)
CSN: [id number]     Arrival date & time 05/27/14  1030 History   First MD Initiated Contact with Patient 05/27/14 1031     Chief Complaint  Patient presents with  . Fever  . Altered Mental Status     (Consider location/radiation/quality/duration/timing/severity/associated sxs/prior Treatment) Patient is a 74 y.o. female presenting with fever and altered mental status. The history is provided by a relative (pt has had a fever and fatique for a week.  treated for uti).  Fever Temp source:  Oral Severity:  Moderate Onset quality:  Gradual Timing:  Constant Progression:  Waxing and waning Chronicity:  New Associated symptoms: no chest pain, no congestion, no cough, no diarrhea, no headaches and no rash   Altered Mental Status Associated symptoms: fever   Associated symptoms: no abdominal pain, no hallucinations, no headaches, no rash and no seizures     Past Medical History  Diagnosis Date  . Anxiety   . Dyspnea   . Diabetes mellitus   . Osteoarthritis   . Hypercholesteremia   . Depression   . RLS (restless legs syndrome)   . Stress   . PONV (postoperative nausea and vomiting)   . Headache(784.0)   . HTN (hypertension)     dr Johnsie Cancel   Past Surgical History  Procedure Laterality Date  . Colonoscopy    . Abdominal hysterectomy    . Lumbar laminectomy/decompression microdiscectomy N/A 04/18/2012    Procedure: LUMBAR LAMINECTOMY/DECOMPRESSION MICRODISCECTOMY 3 LEVELS;  Surgeon: Winfield Cunas, MD;  Location: [place] [facility];  Service: Neurosurgery;  Laterality: N/A;  Lumbar three-four, lumbar four-five, lumbar five-sacral one decompression. Synovial cyst resection lumbar four-five  . Joint replacement    . Left forearm fracture with orif Left   . Replacement total knee      right   Family History  Problem Relation Age of Onset  . Diabetes Father   . High blood pressure Father   . High blood pressure Mother    History  Substance Use Topics  . Smoking status: Never Smoker    . Smokeless tobacco: Never Used  . Alcohol Use: No   OB History    No data available     Review of Systems  Constitutional: Positive for fever. Negative for appetite change and fatigue.  HENT: Negative for congestion, ear discharge and sinus pressure.   Eyes: Negative for discharge.  Respiratory: Negative for cough.   Cardiovascular: Negative for chest pain.  Gastrointestinal: Negative for abdominal pain and diarrhea.  Genitourinary: Negative for frequency and hematuria.  Musculoskeletal: Negative for back pain.  Skin: Negative for rash.  Neurological: Negative for seizures and headaches.  Psychiatric/Behavioral: Negative for hallucinations.      Allergies  Morphine and related; Oxycodone; and Sulfonamide derivatives  Home Medications   Prior to Admission medications   Medication Sig Start Date End Date Taking? Authorizing Provider  acetaminophen (TYLENOL) 500 MG tablet Take 500 mg by mouth every 4 (four) hours as needed for mild pain, fever or headache (99.5 to 101).    Yes Historical Provider, MD  ALPRAZolam Duanne Moron) 0.5 MG tablet Take 0.5 mg by mouth at bedtime.    Yes Historical Provider, MD  alum & mag hydroxide-simeth (MAALOX/MYLANTA) 200-200-20 MG/5ML suspension Take 30 mLs by mouth every 6 (six) hours as needed for indigestion or heartburn. Do not exceed 4 doses in 24 hours   Yes Historical Provider, MD  aspirin 81 MG chewable tablet Chew 81 mg by mouth daily.   Yes Historical Provider,  MD  B Complex Vitamins (B-COMPLEX/B-12 PO) Take 1 tablet by mouth daily.    Yes Historical Provider, MD  bisacodyl (DULCOLAX) 10 MG suppository Place 10 mg rectally as needed for moderate constipation.   Yes Historical Provider, MD  cephALEXin (KEFLEX) 500 MG capsule Take 1 capsule (500 mg total) by mouth 2 (two) times daily. 05/20/14  Yes Nicole Pisciotta, PA-C  cholecalciferol (VITAMIN D) 1000 UNITS tablet Take 1,000 Units by mouth daily.   Yes Historical Provider, MD  Cranberry 500 MG  CAPS Take 1,000 mg by mouth daily at 12 noon.   Yes Historical Provider, MD  glipiZIDE (GLUCOTROL XL) 5 MG 24 hr tablet Take 5 mg by mouth daily with breakfast.    Yes Historical Provider, MD  guaifenesin (ROBITUSSIN) 100 MG/5ML syrup Take 200 mg by mouth every 6 (six) hours as needed for cough. Do not exceed 4 doses in 24 hours   Yes Historical Provider, MD  insulin detemir (LEVEMIR) 100 UNIT/ML injection Inject 18 Units into the skin daily before lunch.   Yes Historical Provider, MD  loperamide (IMODIUM) 2 MG capsule Take 2 mg by mouth as needed for diarrhea or loose stools. Do not exceed 8 doses in 24 hours   Yes Historical Provider, MD  loratadine (CLARITIN) 10 MG tablet Take 10 mg by mouth daily as needed for allergies.    Yes Historical Provider, MD  losartan (COZAAR) 100 MG tablet Take 100 mg by mouth daily.   Yes Historical Provider, MD  magnesium hydroxide (MILK OF MAGNESIA) 400 MG/5ML suspension Take 30 mLs by mouth daily as needed for mild constipation or moderate constipation. 07/26/13  Yes [name], MD  Magnesium Oxide 500 MG TABS Take 1 tablet by mouth daily.   Yes Historical Provider, MD  metFORMIN (GLUCOPHAGE) 500 MG tablet Take 500 mg by mouth 2 (two) times daily with a meal.   Yes Historical Provider, MD  metoCLOPramide (REGLAN) 5 MG tablet Take 5 mg by mouth 2 (two) times daily before a meal.   Yes Historical Provider, MD  metoprolol (TOPROL-XL) 100 MG 24 hr tablet Take 100 mg by mouth daily.     Yes Historical Provider, MD  neomycin-bacitracin-polymyxin (NEOSPORIN) [id number] ointment Apply 1 application topically 4 (four) times daily as needed (skin tears, abrasions, minor irritations).   Yes Historical Provider, MD  omeprazole (PRILOSEC) 40 MG capsule Take 40 mg by mouth daily.   Yes Historical Provider, MD  ondansetron (ZOFRAN) 4 MG tablet Take 4 mg by mouth 3 (three) times daily.    Yes Historical Provider, MD  polyethylene glycol (MIRALAX / GLYCOLAX) packet Take 17 g by  mouth daily. 07/26/13  Yes [name], MD  potassium chloride SA (K-DUR,KLOR-CON) 20 MEQ tablet Take 40 mEq by mouth daily.    Yes Historical Provider, MD  Pramipexole Dihydrochloride 0.375 MG TB24 Take 1 tablet by mouth 3 (three) times daily.   Yes Historical Provider, MD  senna-docusate (SENOKOT-S) 8.6-50 MG per tablet Take 1 tablet by mouth at bedtime as needed for mild constipation. 07/26/13  Yes [name], MD  sertraline (ZOLOFT) 50 MG tablet Take 50 mg by mouth daily.   Yes Historical Provider, MD  traZODone (DESYREL) 50 MG tablet Take 50 mg by mouth at bedtime as needed for sleep.   Yes Historical Provider, MD  UNABLE TO FIND Take 1 Can by mouth 4 (four) times daily. Mighty Shakes   Yes Historical Provider, MD  verapamil (CALAN-SR) 240 MG CR tablet Take 1 tablet (240  mg total) by mouth daily. 07/26/13  Yes [name], MD  zinc oxide (BALMEX) 11.3 % CREA cream Apply 1 application topically 2 (two) times daily. To rash 04/21/14  Yes Ernestina Patches, MD  diphenhydrAMINE (BENADRYL) 25 MG tablet Take 1 tablet (25 mg total) by mouth every 6 (six) hours. Patient not taking: Reported on 05/27/2014 04/21/14   Ernestina Patches, MD  famotidine (PEPCID) 20 MG tablet Take 1 tablet (20 mg total) by mouth 2 (two) times daily. Patient not taking: Reported on 05/27/2014 04/21/14   Ernestina Patches, MD  magnesium chloride (SLOW-MAG) 64 MG TBEC SR tablet Take 1 tablet (64 mg total) by mouth daily. Patient not taking: Reported on 04/21/2014 01/29/14   Everlene Balls, MD  predniSONE (DELTASONE) 20 MG tablet 2 po daily x 3 days Patient not taking: Reported on 05/27/2014 04/21/14   Ernestina Patches, MD   BP 136/69 mmHg  Pulse 68  Temp(Src) 101.5 F (38.6 C) (Rectal)  Resp 18  Wt 220 lb (99.791 kg)  SpO2 93% Physical Exam  Constitutional: She is oriented to person, place, and time. She appears well-developed.  HENT:  Head: Normocephalic.  Eyes: Conjunctivae and EOM are normal. No scleral icterus.  Neck: Neck  supple. No thyromegaly present.  Cardiovascular: Normal rate and regular rhythm.  Exam reveals no gallop and no friction rub.   No murmur heard. Pulmonary/Chest: No stridor. She has no wheezes. She has no rales. She exhibits no tenderness.  Abdominal: She exhibits no distension. There is no tenderness. There is no rebound.  Musculoskeletal: Normal range of motion. She exhibits no edema.  Lymphadenopathy:    She has no cervical adenopathy.  Neurological: She is oriented to person, place, and time. She exhibits normal muscle tone. Coordination normal.  Skin: No rash noted. No erythema.  Psychiatric: She has a normal mood and affect. Her behavior is normal.    ED Course  Procedures (including critical care time) Labs Review Labs Reviewed  CBC WITH DIFFERENTIAL/PLATELET - Abnormal; Notable for the following:    WBC 12.4 (*)    Neutrophils Relative % 83 (*)    Neutro Abs 10.3 (*)    Lymphocytes Relative 11 (*)    All other components within normal limits  URINALYSIS, ROUTINE W REFLEX MICROSCOPIC - Abnormal; Notable for the following:    Glucose, UA 500 (*)    Protein, ur 100 (*)    All other components within normal limits  URINE MICROSCOPIC-ADD ON - Abnormal; Notable for the following:    Squamous Epithelial / LPF FEW (*)    Bacteria, UA FEW (*)    All other components within normal limits  I-STAT CG4 LACTIC ACID, ED - Abnormal; Notable for the following:    Lactic Acid, Venous 3.26 (*)    All other components within normal limits  I-STAT CHEM 8, ED - Abnormal; Notable for the following:    Potassium 7.1 (*)    BUN 54 (*)    Creatinine, Ser 1.20 (*)    Glucose, Bld 265 (*)    All other components within normal limits  URINE CULTURE  CULTURE, BLOOD (ROUTINE X 2)  CULTURE, BLOOD (ROUTINE X 2)  COMPREHENSIVE METABOLIC PANEL    Imaging Review Dg Chest Port 1 View  05/27/2014   CLINICAL DATA:  Acute onset of lethargy 2 weeks ago, somnolence, not eating or taking medications,  history diabetes, hypertension  EXAM: PORTABLE CHEST - 1 VIEW  COMPARISON:  Portable exam 1127 hours compared to 05/20/2014  FINDINGS:  Normal heart size, mediastinal contours and pulmonary vascularity for technique.  Minimal atelectasis at RIGHT base.  Lungs otherwise clear.  No pleural effusion or pneumothorax.  Osseous structures unremarkable.  IMPRESSION: Minimal RIGHT basilar atelectasis.   Electronically Signed   By: Lavonia Dana M.D.   On: 05/27/2014 11:39     EKG Interpretation   Date/Time:  Tuesday May 27 2014 11:17:35 EDT Ventricular Rate:  76 PR Interval:    QRS Duration: 77 QT Interval:  414 QTC Calculation: 465 R Axis:   -30 Text Interpretation:  Accelerated junctional rhythm Left ventricular  hypertrophy Repol abnrm, severe global ischemia (LM/MVD) Confirmed by  Chantell Kunkler  MD, Tamaiya Bump [phone]) on 05/27/2014 1:03:58 PM      MDM   Final diagnoses:  Fever  UTI (lower urinary tract infection)    Admit,  Febrile illness,  Possible uti    Milton Ferguson, MD 05/27/14 1312

## 2014-05-28 ENCOUNTER — Inpatient Hospital Stay (HOSPITAL_COMMUNITY): Payer: Medicare Other

## 2014-05-28 DIAGNOSIS — N179 Acute kidney failure, unspecified: Secondary | ICD-10-CM

## 2014-05-28 DIAGNOSIS — F341 Dysthymic disorder: Secondary | ICD-10-CM

## 2014-05-28 LAB — CBC
HEMATOCRIT: 32.8 % — AB (ref 36.0–46.0)
Hemoglobin: 10.4 g/dL — ABNORMAL LOW (ref 12.0–15.0)
MCH: 27.6 pg (ref 26.0–34.0)
MCHC: 31.7 g/dL (ref 30.0–36.0)
MCV: 87 fL (ref 78.0–100.0)
Platelets: 172 10*3/uL (ref 150–400)
RBC: 3.77 MIL/uL — ABNORMAL LOW (ref 3.87–5.11)
RDW: 14 % (ref 11.5–15.5)
WBC: 7.4 10*3/uL (ref 4.0–10.5)

## 2014-05-28 LAB — URINE CULTURE
CULTURE: NO GROWTH
Colony Count: NO GROWTH

## 2014-05-28 LAB — BASIC METABOLIC PANEL
Anion gap: 6 (ref 5–15)
BUN: 36 mg/dL — ABNORMAL HIGH (ref 6–23)
CHLORIDE: 115 mmol/L — AB (ref 96–112)
CO2: 25 mmol/L (ref 19–32)
Calcium: 9.2 mg/dL (ref 8.4–10.5)
Creatinine, Ser: 1.08 mg/dL (ref 0.50–1.10)
GFR, EST AFRICAN AMERICAN: 57 mL/min — AB (ref >90)
GFR, EST NON AFRICAN AMERICAN: 49 mL/min — AB (ref >90)
Glucose, Bld: 111 mg/dL — ABNORMAL HIGH (ref 70–99)
POTASSIUM: 4.2 mmol/L (ref 3.5–5.1)
Sodium: 146 mmol/L — ABNORMAL HIGH (ref 135–145)

## 2014-05-28 LAB — GLUCOSE, CAPILLARY
GLUCOSE-CAPILLARY: 113 mg/dL — AB (ref 70–99)
GLUCOSE-CAPILLARY: 134 mg/dL — AB (ref 70–99)
GLUCOSE-CAPILLARY: 77 mg/dL (ref 70–99)
GLUCOSE-CAPILLARY: 83 mg/dL (ref 70–99)
Glucose-Capillary: 58 mg/dL — ABNORMAL LOW (ref 70–99)
Glucose-Capillary: 86 mg/dL (ref 70–99)

## 2014-05-28 MED ORDER — ENSURE ENLIVE PO LIQD
237.0000 mL | Freq: Two times a day (BID) | ORAL | Status: DC
  Administered 2014-05-30 – 2014-05-31 (×3): 237 mL via ORAL

## 2014-05-28 NOTE — Progress Notes (Signed)
Clinical Social Work Department BRIEF PSYCHOSOCIAL ASSESSMENT 05/28/2014  Patient:  BRENDIA, DAMPIER     Account Number:  [id number]     Admit date:  05/27/2014  Clinical Social Worker:  Renold Genta  Date/Time:  05/28/2014 02:04 PM  Referred by:  Physician  Date Referred:  05/28/2014 Referred for  Other - See comment   Other Referral:   Admitted from: [facility]   Interview type:  Patient Other interview type:    PSYCHOSOCIAL DATA Living Status:  FACILITY Admitted from facility:   Level of care:  Assisted Living Primary support name:  Fredia Chittenden (daughter) ph#: [phone] Primary support relationship to patient:  CHILD, ADULT Degree of support available:   good    CURRENT CONCERNS Current Concerns  Post-Acute Placement   Other Concerns:    SOCIAL WORK ASSESSMENT / PLAN CSW received consult that patient was admitted from [facility].   Assessment/plan status:  Information/Referral to [employer] Other assessment/ plan:   Information/referral to community resources:   CSW completed FL2 and faxed information to [facility], spoke with Jenny Reichmann who states they will need d/c summary & meds on FL2 at discharge before determining whether they could take patient back at discharge.    PATIENT'S/FAMILY'S RESPONSE TO PLAN OF CARE: Patient informed CSW that she has been living at [facility] a little over a year and has been very pleased with them. CSW reviewed PT evaluation recommending HH, CSW confirmed with Cindy at ALF that they use [facility] & that as long as it is on the [facility] that they would arrange for services once she returns.          Raynaldo Opitz, [facility] Clinical Social Worker cell #: [phone]

## 2014-05-28 NOTE — Progress Notes (Signed)
Resumed care from [place], [place]. Agree with assessments.

## 2014-05-28 NOTE — Progress Notes (Signed)
INITIAL NUTRITION ASSESSMENT  Pt meets criteria for non-severe (moderate) MALNUTRITION in the context of chronic illness as evidenced by 19% body weight loss in 11 months, mild/moderate fat and muscle depletion.  DOCUMENTATION CODES Per approved criteria  -Non-severe (moderate) malnutrition in the context of chronic illness   INTERVENTION: Encourage PO intake Provide Ensure Enlive po BID, each supplement provides 350 kcal and 20 grams of protein   NUTRITION DIAGNOSIS: Unintentional weight loss related to decreased appetite and PO intakes as evidenced by daughter's report per H&P, chart review, 42 lbs (19.1 kg) weight loss (19% body weight) in 11 months.  Goal: Pt to meet >/= 90% estimated needs.  Monitor:  PO intakes, supplement use, labs, weight trends  Reason for Assessment: consult for poor intakes  74 y.o. female  Admitting Dx: Acute encephalopathy  ASSESSMENT: 74 y.o. Female with hx which includes cognitive impairment and DM. Pt with delayed speech admitted from a facility. Per H&P, daughter states that pt has lost weight over the past 30 days and that for about 1 week PTA she was having decreased food and drink intakes and increased day-time drowsiness.  Pt admitted with AMS but able to answer questions in an appropriate manner. She states she had salad for lunch and was able to eat some of it. She denies chewing/swallowing issues. Pt reports stable weight PTA, but chart review indicates significant weight loss over the past 11 months. She states she drinks a supplement at facility and is interested in receiving one here. No intakes are documented but suspect pt not meeting needs at this time.  Nutrition Focused Physical Exam:  Subcutaneous Fat:  Orbital Region: WDL Upper Arm Region: mild/moderate depletion Thoracic and Lumbar Region: WDL  Muscle:  Temple Region: WDL Clavicle Bone Region: WDL Clavicle and Acromion Bone Region: WDL Scapular Bone Region: unable to  assess Dorsal Hand: WDL Patellar Region: WDL Anterior Thigh Region: WDL Posterior Calf Region: mild/moderate depletion  Edema: mild, non-pitting to bilateral ankles   Height: Ht Readings from Last 1 Encounters:  05/27/14 5\' 7"  (1.702 m)    Weight: Wt Readings from Last 1 Encounters:  05/27/14 176 lb 9.4 oz (80.1 kg)    Ideal Body Weight: 135 lbs  % Ideal Body Weight: 130%  Wt Readings from Last 10 Encounters:  05/27/14 176 lb 9.4 oz (80.1 kg)  07/18/13 218 lb 0.6 oz (98.9 kg)  04/29/13 208 lb (94.348 kg)  12/28/12 215 lb (97.523 kg)  04/18/12 219 lb 9.3 oz (99.6 kg)  06/15/10 194 lb (87.998 kg)  06/03/10 180 lb (81.647 kg)  10/29/09 199 lb (90.266 kg)  04/30/09 196 lb (88.905 kg)  11/04/08 203 lb (92.08 kg)    Usual Body Weight: 208 lbs  % Usual Body Weight: 85%  BMI:  Body mass index is 27.65 kg/(m^2).  Estimated Nutritional Needs: Kcal: 1600-2000 Protein: 80-100 grams Fluid: 2L/day  Skin: WDL  Diet Order: Diet heart healthy/carb modified Room service appropriate?: Yes; Fluid consistency:: Thin     Intake/Output Summary (Last 24 hours) at 05/28/14 1458 Last data filed at 05/28/14 1300  Gross per 24 hour  Intake 1733.33 ml  Output      1 ml  Net 1732.33 ml    Last BM: PTA  Labs:   Recent Labs Lab 05/27/14 1108 05/27/14 1209 05/28/14 0533  NA 143 146* 146*  K 7.1* 4.9 4.2  CL 112 111 115*  CO2  --  24 25  BUN 54* 37* 36*  CREATININE 1.20* 1.30*  1.08  CALCIUM  --  9.4 9.2  GLUCOSE 265* 198* 111*    CBG (last 3)   Recent Labs  05/27/14 2120 05/28/14 0725 05/28/14 1151  GLUCAP 135* 113* 134*    Scheduled Meds: . ALPRAZolam  0.5 mg Oral QHS  . aspirin  81 mg Oral Daily  . cefTRIAXone (ROCEPHIN)  IV  1 g Intravenous Q24H  . enoxaparin (LOVENOX) injection  40 mg Subcutaneous Q24H  . glipiZIDE  5 mg Oral Q breakfast  . insulin aspart  0-15 Units Subcutaneous TID WC  . insulin aspart  0-5 Units Subcutaneous QHS  . insulin  detemir  20 Units Subcutaneous QAC lunch  . losartan  100 mg Oral Daily  . magnesium oxide  400 mg Oral Daily  . metoCLOPramide  5 mg Oral BID AC  . metoprolol succinate  100 mg Oral Daily  . pantoprazole  40 mg Oral Daily  . polyethylene glycol  17 g Oral Daily  . sertraline  50 mg Oral Daily  . sodium chloride  3 mL Intravenous Q12H    Continuous Infusions: . sodium chloride 100 mL/hr at 05/28/14 1242    Past Medical History  Diagnosis Date  . Anxiety   . Dyspnea   . Diabetes mellitus   . Osteoarthritis   . Hypercholesteremia   . Depression   . RLS (restless legs syndrome)   . Stress   . PONV (postoperative nausea and vomiting)   . Headache(784.0)   . HTN (hypertension)     dr Johnsie Cancel    Past Surgical History  Procedure Laterality Date  . Colonoscopy    . Abdominal hysterectomy    . Lumbar laminectomy/decompression microdiscectomy N/A 04/18/2012    Procedure: LUMBAR LAMINECTOMY/DECOMPRESSION MICRODISCECTOMY 3 LEVELS;  Surgeon: Winfield Cunas, MD;  Location: [place] [facility];  Service: Neurosurgery;  Laterality: N/A;  Lumbar three-four, lumbar four-five, lumbar five-sacral one decompression. Synovial cyst resection lumbar four-five  . Joint replacement    . Left forearm fracture with orif Left   . Replacement total knee      right    Jarome Matin, RD, LDN Inpatient Clinical Dietitian Pager # [phone] After hours/weekend pager # [phone]

## 2014-05-28 NOTE — Progress Notes (Signed)
Hypoglycemic Event  CBG: 58  Treatment: 15 GM carbohydrate snack  Symptoms: None  Follow-up CBG: Time:1800 CBG Result:83  Possible Reasons for Event: Inadequate meal intake  Comments/MD notified:Rai    [facility]  Remember to initiate Hypoglycemia Order Set & complete

## 2014-05-28 NOTE — Progress Notes (Signed)
[facility]                                                                              Patient Demographics  [name], is a 74 y.o. female, DOB - 1940-08-24, [place] date - 05/27/2014   Admitting Physician Kelvin Cellar, MD  Outpatient Primary MD:  [name], MD  LOS - 1   Chief Complaint  Patient presents with  . Fever  . Altered Mental Status       Brief HPI   [name] is 74 y.o. female with a past medical history of cognitive impairment, presented as a transfer from her nursing facility to the emergency room with complaints of functional decline/failure to thrive over the past week. She was diagnosed with a urinary tract infection on 05/20/2014 as urinalysis showed the presence of many bacteria. Patient was treated with Keflex empirically. History obtained from her daughter who was present at bedside as patient unable to provide history or participate in her own plan of care. Over the course of the week she continued to decline, becoming less active, minimally verbal, having excessive daytime sleepiness, decreased by mouth intake. She has also had fevers and chills. At her facility she was found to be slumped over her food at the dining room table. Initial lab work performed in the emergency room today showed an elevated lactic acid of 3.26, white count of 12.4, found to have a temperature 101.5.   Assessment & Plan    Principal Problem:   Acute encephalopathy; likely due to sepsis, UTI, acute kidney injury, dehydration, lactic acidosis superimposed on dementia - Patient somewhat more alert and awake today - Continue IV fluids, IV Rocephin - Chest x-ray reviewed, hypoinflation with minimal left retrocardiac opacification, atelectasis or infection  Active Problems:  Urinary tract infection, sepsis - Patient was diagnosed with UTI on 05/20/14 treated with Keflex however continued to have ongoing fevers  and functional decline. - Urine culture and sensitivities in process, blood cultures negative so far, continue IV Rocephin  Acute kidney injury with dehydration, lactic acidosis - Lactic acid trending down, creatinine improving, continue IV fluid hydration    ANXIETY DEPRESSION - Currently stable  Dementia with functional decline - Placed PT evaluation - Out of bed, increase mobility    Diabetes mellitus - Insulin-dependent, discontinued metformin, glipizide due to acute kidney injury, continue Levemir  Hypertension - Currently stable, continue metoprolol, Cozaar   Code Status: DO NOT RESUSCITATE  Family Communication: Discussed in detail with the patient, all imaging results, lab results explained to the patient's daughter at the bedside   Disposition Plan: Back to skilled nursing facility, however daughter is also exploring possibility of home with private caregivers, she will discuss with patient's husband/her father today  Time Spent in minutes  25 minutes  Procedures  CXR  Consults   none  DVT Prophylaxis  Lovenox  Medications  Scheduled Meds: . ALPRAZolam  0.5 mg Oral QHS  . aspirin  81 mg Oral Daily  . cefTRIAXone (ROCEPHIN)  IV  1 g Intravenous Q24H  . enoxaparin (LOVENOX) injection  40 mg Subcutaneous Q24H  . glipiZIDE  5 mg Oral Q breakfast  .  insulin aspart  0-15 Units Subcutaneous TID WC  . insulin aspart  0-5 Units Subcutaneous QHS  . insulin detemir  20 Units Subcutaneous QAC lunch  . losartan  100 mg Oral Daily  . magnesium oxide  400 mg Oral Daily  . metoCLOPramide  5 mg Oral BID AC  . metoprolol succinate  100 mg Oral Daily  . pantoprazole  40 mg Oral Daily  . polyethylene glycol  17 g Oral Daily  . sertraline  50 mg Oral Daily  . sodium chloride  3 mL Intravenous Q12H   Continuous Infusions: . sodium chloride 100 mL/hr at 05/27/14 1619   PRN Meds:.alum & mag hydroxide-simeth, senna-docusate, traZODone   Antibiotics   Anti-infectives      Start     Dose/Rate Route Frequency Ordered Stop   05/27/14 1615  cefTRIAXone (ROCEPHIN) 1 g in dextrose 5 % 50 mL IVPB     1 g 100 mL/hr over 30 Minutes Intravenous Every 24 hours 05/27/14 1605     05/27/14 1130  levofloxacin (LEVAQUIN) IVPB 500 mg     500 mg 100 mL/hr over 60 Minutes Intravenous  Once 05/27/14 1117 05/27/14 1311        Subjective:   [name] was seen and examined today.  Much more alert and awake, oriented to self, able to name her daughter and her daughter's birthday. Patient denies dizziness, chest pain, shortness of breath, abdominal pain, N/V/D/C, new weakness, numbess, tingling. No acute events overnight.    Objective:   Blood pressure 156/88, pulse 64, temperature 97.7 F (36.5 C), temperature source Oral, resp. rate 16, height 5\' 7"  (1.702 m), weight 80.1 kg (176 lb 9.4 oz), SpO2 97 %.  Wt Readings from Last 3 Encounters:  05/27/14 80.1 kg (176 lb 9.4 oz)  07/18/13 98.9 kg (218 lb 0.6 oz)  04/29/13 94.348 kg (208 lb)     Intake/Output Summary (Last 24 hours) at 05/28/14 1116 Last data filed at 05/28/14 0644  Gross per 24 hour  Intake 2373.33 ml  Output      1 ml  Net 2372.33 ml    Exam  General: Alert and oriented x 2, NAD  HEENT:  PERRLA, EOMI, Anicteic Sclera, mucous membranes moist.   Neck: Supple, no JVD, no masses  CVS: S1 S2 auscultated, no rubs, murmurs or gallops. Regular rate and rhythm.  Respiratory: Clear to auscultation bilaterally, no wheezing, rales or rhonchi  Abdomen: Soft, nontender, nondistended, + bowel sounds  Ext: no cyanosis clubbing or edema  Neuro: AAOx3, Cr N's II- XII. Strength 5/5 upper and lower extremities bilaterally  Skin: No rashes  Psych: Normal affect and demeanor, alert and oriented x2   Data Review   Micro Results Recent Results (from the past 240 hour(s))  Urine culture     Status: None   Collection Time: 05/20/14 11:32 AM  Result Value Ref Range Status   Specimen Description  URINE, CATHETERIZED  Final   Special Requests NONE  Final   Colony Count   Final    >=100,000 COLONIES/ML Performed at [facility]    Culture   Final    VIRIDANS STREPTOCOCCUS Performed at [facility]    Report Status 05/21/2014 FINAL  Final  Blood culture (routine x 2)     Status: None   Collection Time: 05/20/14  3:00 PM  Result Value Ref Range Status   Specimen Description BLOOD LEFT HAND  Final   Special Requests BOTTLES DRAWN AEROBIC AND ANAEROBIC 3CC  Final   Culture   Final    NO GROWTH 5 DAYS Performed at [facility]    Report Status 05/26/2014 FINAL  Final  Blood culture (routine x 2)     Status: None   Collection Time: 05/20/14  3:00 PM  Result Value Ref Range Status   Specimen Description BLOOD RIGHT HAND  Final   Special Requests BOTTLES DRAWN AEROBIC AND ANAEROBIC 4CC  Final   Culture   Final    NO GROWTH 5 DAYS Performed at [facility]    Report Status 05/26/2014 FINAL  Final  Blood culture (routine x 2)     Status: None (Preliminary result)   Collection Time: 05/27/14 12:10 PM  Result Value Ref Range Status   Specimen Description BLOOD RIGHT FOREARM  Final   Special Requests BOTTLES DRAWN AEROBIC AND ANAEROBIC 1ML  Final   Culture   Final           BLOOD CULTURE RECEIVED NO GROWTH TO DATE CULTURE WILL BE HELD FOR 5 DAYS BEFORE ISSUING A FINAL NEGATIVE REPORT Performed at [facility]    Report Status PENDING  Incomplete  Blood culture (routine x 2)     Status: None (Preliminary result)   Collection Time: 05/27/14 12:12 PM  Result Value Ref Range Status   Specimen Description BLOOD RIGHT ANTECUBITAL  Final   Special Requests BOTTLES DRAWN AEROBIC AND ANAEROBIC 5ML  Final   Culture   Final           BLOOD CULTURE RECEIVED NO GROWTH TO DATE CULTURE WILL BE HELD FOR 5 DAYS BEFORE ISSUING A FINAL NEGATIVE REPORT Performed at [facility]    Report Status PENDING  Incomplete    Radiology Reports Dg  Chest 2 View  05/28/2014   CLINICAL DATA:  Fever.  EXAM: CHEST  2 VIEW  COMPARISON:  05/27/2014 and 05/20/2014  FINDINGS: Lungs are hypoinflated minimal left retrocardiac opacification as cannot exclude atelectasis or infection. No evidence of effusion. Cardiomediastinal silhouette and remainder of the exam is unchanged.  IMPRESSION: Hypoinflation with minimal left retrocardiac opacification which may be due to atelectasis or infection.   Electronically Signed   By: Marin Olp M.D.   On: 05/28/2014 10:07   Ct Head Wo Contrast  05/20/2014   CLINICAL DATA:  Confusion last 3 days  EXAM: CT HEAD WITHOUT CONTRAST  TECHNIQUE: Contiguous axial images were obtained from the base of the skull through the vertex without intravenous contrast.  COMPARISON:  01/29/2014  FINDINGS: No skull fracture is noted. Paranasal sinuses and mastoid air cells are unremarkable. There is mucosal thickening with complete opacification of the left maxillary sinus. The mastoid air cells are unremarkable.  No intracranial hemorrhage, mass effect or midline shift. Stable cerebral atrophy. Stable ventriculomegaly. Again noted mild periventricular and patchy subcortical white matter decreased attenuation consistent with chronic small vessel ischemic changes. No definite acute cortical infarction. No mass lesion is noted on this unenhanced scan. Atherosclerotic calcifications of carotid siphon.  IMPRESSION: 1. No acute intracranial abnormality. Stable cerebral atrophy and mild ventriculomegaly. Stable chronic white matter disease. No definite acute cortical infarction. 2. There is mucosal thickening with complete opacification of the left maxillary sinus.   Electronically Signed   By: Lahoma Crocker M.D.   On: 05/20/2014 14:58   Dg Chest Port 1 View  05/27/2014   CLINICAL DATA:  Acute onset of lethargy 2 weeks ago, somnolence, not eating or taking medications, history diabetes, hypertension  EXAM:  PORTABLE CHEST - 1 VIEW  COMPARISON:  Portable  exam 1127 hours compared to 05/20/2014  FINDINGS: Normal heart size, mediastinal contours and pulmonary vascularity for technique.  Minimal atelectasis at RIGHT base.  Lungs otherwise clear.  No pleural effusion or pneumothorax.  Osseous structures unremarkable.  IMPRESSION: Minimal RIGHT basilar atelectasis.   Electronically Signed   By: Lavonia Dana M.D.   On: 05/27/2014 11:39   Dg Chest Port 1 View  05/20/2014   CLINICAL DATA:  Altered mental status.  Unspecified duration.  EXAM: PORTABLE CHEST - 1 VIEW  COMPARISON:  01/29/2014.  FINDINGS: Normal heart size. Clear lung fields. Mild bronchitic change. No active infiltrates or failure. Visualized skeletal structures are unremarkable. Similar appearance to priors.  IMPRESSION: No active cardiopulmonary disease.   Electronically Signed   By: Rolla Flatten M.D.   On: 05/20/2014 13:54    CBC  Recent Labs Lab 05/27/14 1049 05/27/14 1108 05/28/14 0533  WBC 12.4*  --  7.4  HGB 12.8 13.3 10.4*  HCT 40.2 39.0 32.8*  PLT 242  --  172  MCV 86.3  --  87.0  MCH 27.5  --  27.6  MCHC 31.8  --  31.7  RDW 14.2  --  14.0  LYMPHSABS 1.3  --   --   MONOABS 0.8  --   --   EOSABS 0.0  --   --   BASOSABS 0.0  --   --     Chemistries   Recent Labs Lab 05/27/14 1108 05/27/14 1209 05/28/14 0533  NA 143 146* 146*  K 7.1* 4.9 4.2  CL 112 111 115*  CO2  --  24 25  GLUCOSE 265* 198* 111*  BUN 54* 37* 36*  CREATININE 1.20* 1.30* 1.08  CALCIUM  --  9.4 9.2  AST  --  21  --   ALT  --  14  --   ALKPHOS  --  72  --   BILITOT  --  0.6  --    ------------------------------------------------------------------------------------------------------------------ estimated creatinine clearance is 49 mL/min (by C-G formula based on Cr of 1.08). ------------------------------------------------------------------------------------------------------------------ No results for input(s): HGBA1C in the last 72  hours. ------------------------------------------------------------------------------------------------------------------ No results for input(s): CHOL, HDL, LDLCALC, TRIG, CHOLHDL, LDLDIRECT in the last 72 hours. ------------------------------------------------------------------------------------------------------------------ No results for input(s): TSH, T4TOTAL, T3FREE, THYROIDAB in the last 72 hours.  Invalid input(s): FREET3 ------------------------------------------------------------------------------------------------------------------ No results for input(s): VITAMINB12, FOLATE, FERRITIN, TIBC, IRON, RETICCTPCT in the last 72 hours.  Coagulation profile No results for input(s): INR, PROTIME in the last 168 hours.  No results for input(s): DDIMER in the last 72 hours.  Cardiac Enzymes No results for input(s): CKMB, TROPONINI, MYOGLOBIN in the last 168 hours.  Invalid input(s): CK ------------------------------------------------------------------------------------------------------------------ Invalid input(s): [place]  05/27/14 1712 05/27/14 2120 05/28/14 0725  GLUCAP 142* 135* 113*     Octavius Shin M.D. [facility] 05/28/2014, 11:16 AM  Pager: [phone]   Between 7am to 7pm - call Pager - [phone]  After 7pm go to [email or web address] - password [email or web address]  Call night coverage person covering after 7pm

## 2014-05-28 NOTE — Evaluation (Signed)
Physical Therapy Evaluation Patient Details Name: [name] MRN: [id number] DOB: [birth year] Today's Date: 05/28/2014   History of Present Illness  74 yo female with onset of AMS changes and UTI with reudced endurance, hypoinflation of lungs and AKI was noted with PMHx:  DM, HTN  Clinical Impression  Pt was seen for evaluation after increasing weakness in the setting of previous weakness and PT treatment/intervention at her ALF.  Her plan is to continue therapy at hospital and to resume visits at ALF when appropriate.    Follow Up Recommendations Home health PT;Supervision/Assistance - 24 hour    Equipment Recommendations  None recommended by PT    Recommendations for Other Services       Precautions / Restrictions Precautions Precautions: Fall;Other (comment) (telemetry) Restrictions Weight Bearing Restrictions: No      Mobility  Bed Mobility Overal bed mobility: Needs Assistance Bed Mobility: Supine to Sit     Supine to sit: Mod assist     General bed mobility comments: HOB elevated  Transfers Overall transfer level: Needs assistance Equipment used: Standard walker;1 person hand held assist Transfers: Sit to/from Stand;Stand Pivot Transfers Sit to Stand: Max assist Stand pivot transfers: Max assist       General transfer comment: Gilford Rile was unsuccessful and so did a direct hands on transition to chair with pt making a good effort  Ambulation/Gait             General Gait Details: unable to assist with this yet  Stairs            Wheelchair Mobility    Modified Rankin (Stroke Patients Only)       Balance Overall balance assessment: Needs assistance Sitting-balance support: Feet supported Sitting balance-Leahy Scale: Good   Postural control: Posterior lean Standing balance support: Bilateral upper extremity supported Standing balance-Leahy Scale: Poor                               Pertinent Vitals/Pain Pain  Assessment: No/denies pain    Home Living Family/patient expects to be discharged to:: Assisted living               Home Equipment: Walker - 2 wheels;Shower seat;Bedside commode      Prior Function Level of Independence: Needs assistance   Gait / Transfers Assistance Needed: Not ambulatory recently  ADL's / Homemaking Assistance Needed: lives in ALF        Hand Dominance        Extremity/Trunk Assessment   Upper Extremity Assessment: Generalized weakness           Lower Extremity Assessment: Generalized weakness      Cervical / Trunk Assessment: Kyphotic  Communication   Communication: No difficulties;Other (comment) (responds slowly)  Cognition Arousal/Alertness: Awake/alert Behavior During Therapy: Flat affect Overall Cognitive Status: Impaired/Different from baseline Area of Impairment: Safety/judgement;Awareness;Problem solving;Following commands       Following Commands: Follows one step commands inconsistently Safety/Judgement: Decreased awareness of safety Awareness: Anticipatory Problem Solving: Slow processing;Decreased initiation;Difficulty sequencing;Requires verbal cues General Comments: Daughter is aware of deficits and cues her mother to follow instructions    General Comments General comments (skin integrity, edema, etc.): Pt is feeling better and willing to try to get OOB, and was able to get her up to eat lunch in the chair.  Nursing is inserviced on assisting back to bed.    Exercises        Assessment/Plan  PT Assessment Patient needs continued PT services  PT Diagnosis Generalized weakness   PT Problem List Decreased strength;Decreased range of motion;Decreased activity tolerance;Decreased balance;Decreased mobility;Decreased coordination;Decreased knowledge of use of DME;Decreased safety awareness;Cardiopulmonary status limiting activity;Decreased skin integrity  PT Treatment Interventions DME instruction;Gait  training;Functional mobility training;Therapeutic activities;Therapeutic exercise;Balance training;Neuromuscular re-education;Patient/family education;Cognitive remediation   PT Goals (Current goals can be found in the Care Plan section) Acute Rehab PT Goals Patient Stated Goal: To go home to ALF PT Goal Formulation: With patient/family Time For Goal Achievement: 06/11/14 Potential to Achieve Goals: Good    Frequency Min 3X/week   Barriers to discharge Other (comment) (Will need to continue PT which was started before trip to hs)      Co-evaluation               End of Session Equipment Utilized During Treatment: Gait belt Activity Tolerance: Patient limited by fatigue;Patient limited by lethargy Patient left: in chair;with call bell/phone within reach;with family/visitor present Nurse Communication: Mobility status         Time: 1132-1202 PT Time Calculation (min) (ACUTE ONLY): 30 min   Charges:   PT Evaluation $Initial PT Evaluation Tier I: 1 Procedure PT Treatments $Therapeutic Activity: 8-22 mins   PT G Codes:        Ramond Dial 2014-06-22, 1:01 PM   Mee Hives, PT MS Acute Rehab Dept. Number: [phone]

## 2014-05-29 ENCOUNTER — Inpatient Hospital Stay (HOSPITAL_COMMUNITY): Payer: Medicare Other

## 2014-05-29 DIAGNOSIS — E11649 Type 2 diabetes mellitus with hypoglycemia without coma: Secondary | ICD-10-CM

## 2014-05-29 LAB — GLUCOSE, CAPILLARY
GLUCOSE-CAPILLARY: 188 mg/dL — AB (ref 70–99)
GLUCOSE-CAPILLARY: 92 mg/dL (ref 70–99)
GLUCOSE-CAPILLARY: 85 mg/dL (ref 70–99)
Glucose-Capillary: 75 mg/dL (ref 70–99)

## 2014-05-29 LAB — POCT I-STAT, CHEM 8
BUN: 54 mg/dL — AB (ref 6–23)
CREATININE: 1.2 mg/dL — AB (ref 0.50–1.10)
Calcium, Ion: 1.14 mmol/L (ref 1.13–1.30)
Chloride: 112 mmol/L (ref 96–112)
Glucose, Bld: 265 mg/dL — ABNORMAL HIGH (ref 70–99)
HEMATOCRIT: 39 % (ref 36.0–46.0)
Hemoglobin: 13.3 g/dL (ref 12.0–15.0)
Potassium: 7.1 mmol/L (ref 3.5–5.1)
SODIUM: 143 mmol/L (ref 135–145)
TCO2: 23 mmol/L (ref 0–100)

## 2014-05-29 LAB — BASIC METABOLIC PANEL
Anion gap: 8 (ref 5–15)
BUN: 27 mg/dL — ABNORMAL HIGH (ref 6–23)
CO2: 22 mmol/L (ref 19–32)
Calcium: 8.6 mg/dL (ref 8.4–10.5)
Chloride: 112 mmol/L (ref 96–112)
Creatinine, Ser: 0.85 mg/dL (ref 0.50–1.10)
GFR, EST AFRICAN AMERICAN: 76 mL/min — AB (ref >90)
GFR, EST NON AFRICAN AMERICAN: 65 mL/min — AB (ref >90)
Glucose, Bld: 79 mg/dL (ref 70–99)
POTASSIUM: 3.1 mmol/L — AB (ref 3.5–5.1)
SODIUM: 142 mmol/L (ref 135–145)

## 2014-05-29 MED ORDER — PRAMIPEXOLE DIHYDROCHLORIDE 0.25 MG PO TABS
0.3750 mg | ORAL_TABLET | Freq: Three times a day (TID) | ORAL | Status: DC
  Administered 2014-05-29 – 2014-05-31 (×5): 0.375 mg via ORAL
  Filled 2014-05-29 (×6): qty 1

## 2014-05-29 MED ORDER — ONDANSETRON HCL 4 MG PO TABS
4.0000 mg | ORAL_TABLET | Freq: Three times a day (TID) | ORAL | Status: DC
  Administered 2014-05-29 – 2014-05-31 (×5): 4 mg via ORAL
  Filled 2014-05-29 (×7): qty 1

## 2014-05-29 MED ORDER — INSULIN DETEMIR 100 UNIT/ML ~~LOC~~ SOLN
15.0000 [IU] | Freq: Every day | SUBCUTANEOUS | Status: DC
  Administered 2014-05-29: 15 [IU] via SUBCUTANEOUS
  Filled 2014-05-29 (×2): qty 0.15

## 2014-05-29 MED ORDER — PRAMIPEXOLE DIHYDROCHLORIDE ER 0.375 MG PO TB24: 1.0000 | ORAL_TABLET | Freq: Three times a day (TID) | ORAL | Status: DC

## 2014-05-29 MED ORDER — POTASSIUM CHLORIDE 20 MEQ PO PACK: 40.0000 meq | PACK | Freq: Once | ORAL | Status: DC

## 2014-05-29 MED ORDER — VERAPAMIL HCL ER 180 MG PO TBCR
180.0000 mg | EXTENDED_RELEASE_TABLET | Freq: Every day | ORAL | Status: DC
  Administered 2014-05-29 – 2014-05-31 (×3): 180 mg via ORAL
  Filled 2014-05-29 (×3): qty 1

## 2014-05-29 MED ORDER — INSULIN ASPART 100 UNIT/ML ~~LOC~~ SOLN: 0.0000 [IU] | Freq: Every day | SUBCUTANEOUS | Status: DC

## 2014-05-29 MED ORDER — INSULIN ASPART 100 UNIT/ML ~~LOC~~ SOLN
0.0000 [IU] | Freq: Three times a day (TID) | SUBCUTANEOUS | Status: DC
  Administered 2014-05-29 – 2014-05-31 (×3): 2 [IU] via SUBCUTANEOUS

## 2014-05-29 MED ORDER — PANTOPRAZOLE SODIUM 40 MG IV SOLR
40.0000 mg | Freq: Two times a day (BID) | INTRAVENOUS | Status: DC
  Administered 2014-05-29 – 2014-05-31 (×3): 40 mg via INTRAVENOUS
  Filled 2014-05-29 (×4): qty 40

## 2014-05-29 MED ORDER — HYDRALAZINE HCL 20 MG/ML IJ SOLN
10.0000 mg | Freq: Four times a day (QID) | INTRAMUSCULAR | Status: DC | PRN
  Administered 2014-05-29 (×2): 10 mg via INTRAVENOUS
  Filled 2014-05-29 (×2): qty 1

## 2014-05-29 MED ORDER — POTASSIUM CHLORIDE CRYS ER 20 MEQ PO TBCR
40.0000 meq | EXTENDED_RELEASE_TABLET | Freq: Once | ORAL | Status: AC
  Administered 2014-05-29: 40 meq via ORAL
  Filled 2014-05-29: qty 2

## 2014-05-29 NOTE — Progress Notes (Signed)
PT Cancellation Note  Patient Details Name: [name] MRN: [id number] DOB: [birth year]   Cancelled Treatment:    Reason Eval/Treat Not Completed: Medical issues which prohibited therapy (pt with high BP, RN to give meds, requests PT to hold, will check back as schedule permits)   Chadric Kimberley,KATHrine E 05/29/2014, 2:54 PM Carmelia Bake, PT, DPT 05/29/2014 Pager: [phone]

## 2014-05-29 NOTE — Progress Notes (Addendum)
[facility]                                                                              Patient Demographics  [name], is a 74 y.o. female, DOB - 1940/06/13, [place] date - 05/27/2014   Admitting Physician Kelvin Cellar, MD  Outpatient Primary MD:  [name], MD  LOS - 2   Chief Complaint  Patient presents with  . Fever  . Altered Mental Status       Brief HPI   [name] is 74 y.o. female with a past medical history of cognitive impairment, presented as a transfer from her nursing facility to the emergency room with complaints of functional decline/failure to thrive over the past week. She was diagnosed with a urinary tract infection on 05/20/2014 as urinalysis showed the presence of many bacteria. Patient was treated with Keflex empirically. History obtained from her daughter who was present at bedside as patient unable to provide history or participate in her own plan of care. Over the course of the week she continued to decline, becoming less active, minimally verbal, having excessive daytime sleepiness, decreased by mouth intake. She has also had fevers and chills. At her facility she was found to be slumped over her food at the dining room table. Initial lab work performed in the emergency room today showed an elevated lactic acid of 3.26, white count of 12.4, found to have a temperature 101.5.  4/7 am : Urinary retention issue, bladder scan showed> 200 mL, in and out cath done 700 mL out   Assessment & Plan    Principal Problem:   Acute encephalopathy; likely due to sepsis, UTI, acute kidney injury, dehydration, lactic acidosis superimposed on dementia - Patient alert and oriented 2, pleasant, close to her baseline status - Continue IV fluids, IV Rocephin - Chest x-ray reviewed, hypoinflation with minimal left retrocardiac opacification, atelectasis or infection  Active Problems:  Urinary tract  infection, sepsis, acute urinary retention - Patient was diagnosed with UTI on 05/20/14 treated with Keflex however continued to have ongoing fevers and functional decline. - Urine culture and sensitivities in process, blood cultures negative so far, continue IV Rocephin - Recheck bladder scan, if unable to void, [address] Foley catheter, will need urology outpatient for voiding trial, also for recurrent UTIs  Acute kidney injury with dehydration, lactic acidosis - Lactic acid trending down, creatinine improved, 0.85, continue gentle hydration    ANXIETY DEPRESSION - Currently stable  Dementia with functional decline - Placed PT evaluation - Out of bed, increase mobility    Diabetes mellitus with hypoglycemia episodes - Decreased her Levemir to 15 units, decreased sliding scale insulin to sensitive  Hypertension - Currently stable, continue metoprolol, Cozaar  Addendum 6:15 PM  Episode of nausea/vomiting - Discussed in detail with the patient's daughter, who reports that the salad triggered the episode of nausea and vomiting today and this has been going on for "years" at least for 20 years, usually triggered by the salad and wondering if patient can have endoscopy with anesthesia. I explained it to the patient's daughter that patient was eating fine earlier this morning witnessed by myself without  any choking episodes or dysphagia. Will obtain KUB now and upper GI series in a.m. if there is no obstruction, endoscopy is not warranted at this time. However if she has any further repeat episodes, she should discuss it with her primary care physician and request for GI referral.   Code Status: DO NOT RESUSCITATE  Family Communication: Discussed in detail with the patient, all imaging results, lab results explained to the patient's daughter    Disposition Plan: Back to skilled nursing facility, however daughter is also exploring possibility of home with private caregivers, she will discuss  with patient's husband/her father. Social work updated. DC likely in a.m.  Time Spent in minutes  25 minutes  Procedures  CXR  Consults   none  DVT Prophylaxis  Lovenox  Medications  Scheduled Meds: . ALPRAZolam  0.5 mg Oral QHS  . aspirin  81 mg Oral Daily  . cefTRIAXone (ROCEPHIN)  IV  1 g Intravenous Q24H  . enoxaparin (LOVENOX) injection  40 mg Subcutaneous Q24H  . feeding supplement (ENSURE ENLIVE)  237 mL Oral BID BM  . glipiZIDE  5 mg Oral Q breakfast  . insulin aspart  0-5 Units Subcutaneous QHS  . insulin aspart  0-9 Units Subcutaneous TID WC  . insulin detemir  15 Units Subcutaneous QAC lunch  . losartan  100 mg Oral Daily  . magnesium oxide  400 mg Oral Daily  . metoCLOPramide  5 mg Oral BID AC  . metoprolol succinate  100 mg Oral Daily  . pantoprazole  40 mg Oral Daily  . polyethylene glycol  17 g Oral Daily  . sertraline  50 mg Oral Daily  . sodium chloride  3 mL Intravenous Q12H   Continuous Infusions: . sodium chloride 100 mL/hr at 05/29/14 0600   PRN Meds:.acetaminophen **OR** acetaminophen, alum & mag hydroxide-simeth, ondansetron **OR** ondansetron (ZOFRAN) IV, senna-docusate, traZODone   Antibiotics   Anti-infectives    Start     Dose/Rate Route Frequency Ordered Stop   05/27/14 1615  cefTRIAXone (ROCEPHIN) 1 g in dextrose 5 % 50 mL IVPB     1 g 100 mL/hr over 30 Minutes Intravenous Every 24 hours 05/27/14 1605     05/27/14 1130  levofloxacin (LEVAQUIN) IVPB 500 mg     500 mg 100 mL/hr over 60 Minutes Intravenous  Once 05/27/14 1117 05/27/14 1311        Subjective:   [name] was seen and examined today.  Continues to remain alert and awake, today feeding herself eating breakfast without any difficulty. Patient oriented to herself, did not know the day today. Patient denies dizziness, chest pain, shortness of breath, abdominal pain, N/V/D/C, new weakness, numbess, tingling. No acute events overnight.    Objective:   Blood pressure  147/78, pulse 58, temperature 98 F (36.7 C), temperature source Oral, resp. rate 18, height 5\' 7"  (1.702 m), weight 80.1 kg (176 lb 9.4 oz), SpO2 100 %.  Wt Readings from Last 3 Encounters:  05/27/14 80.1 kg (176 lb 9.4 oz)  07/18/13 98.9 kg (218 lb 0.6 oz)  04/29/13 94.348 kg (208 lb)     Intake/Output Summary (Last 24 hours) at 05/29/14 1004 Last data filed at 05/29/14 0900  Gross per 24 hour  Intake 3731.67 ml  Output    700 ml  Net 3031.67 ml    Exam  General: Alert and oriented x 2, NAD, pleasant and smiling, engaging in conversation  HEENT:  PERRLA, EOMI, Anicteic Sclera, mucous membranes moist.  Neck: Supple, no JVD, no masses  CVS: S1 S2 auscultated, no rubs, murmurs or gallops. Regular rate and rhythm.  Respiratory: Clear to auscultation bilaterally, no wheezing, rales or rhonchi  Abdomen: Soft, nontender, nondistended, + bowel sounds  Ext: no cyanosis clubbing or edema  Neuro: AAOx3, Cr N's II- XII. Strength 5/5 upper and lower extremities bilaterally  Skin: No rashes  Psych: Normal affect and demeanor, alert and oriented x2   Data Review   Micro Results Recent Results (from the past 240 hour(s))  Urine culture     Status: None   Collection Time: 05/20/14 11:32 AM  Result Value Ref Range Status   Specimen Description URINE, CATHETERIZED  Final   Special Requests NONE  Final   Colony Count   Final    >=100,000 COLONIES/ML Performed at [facility]    Culture   Final    VIRIDANS STREPTOCOCCUS Performed at [facility]    Report Status 05/21/2014 FINAL  Final  Blood culture (routine x 2)     Status: None   Collection Time: 05/20/14  3:00 PM  Result Value Ref Range Status   Specimen Description BLOOD LEFT HAND  Final   Special Requests BOTTLES DRAWN AEROBIC AND ANAEROBIC 3CC  Final   Culture   Final    NO GROWTH 5 DAYS Performed at [facility]    Report Status 05/26/2014 FINAL  Final  Blood culture (routine x 2)      Status: None   Collection Time: 05/20/14  3:00 PM  Result Value Ref Range Status   Specimen Description BLOOD RIGHT HAND  Final   Special Requests BOTTLES DRAWN AEROBIC AND ANAEROBIC 4CC  Final   Culture   Final    NO GROWTH 5 DAYS Performed at [facility]    Report Status 05/26/2014 FINAL  Final  Urine culture     Status: None   Collection Time: 05/27/14 10:58 AM  Result Value Ref Range Status   Specimen Description URINE, CLEAN CATCH  Final   Special Requests NONE  Final   Colony Count NO GROWTH Performed at [facility]   Final   Culture NO GROWTH Performed at [facility]   Final   Report Status 05/28/2014 FINAL  Final  Blood culture (routine x 2)     Status: None (Preliminary result)   Collection Time: 05/27/14 12:10 PM  Result Value Ref Range Status   Specimen Description BLOOD RIGHT FOREARM  Final   Special Requests BOTTLES DRAWN AEROBIC AND ANAEROBIC 1ML  Final   Culture   Final           BLOOD CULTURE RECEIVED NO GROWTH TO DATE CULTURE WILL BE HELD FOR 5 DAYS BEFORE ISSUING A FINAL NEGATIVE REPORT Performed at [facility]    Report Status PENDING  Incomplete  Blood culture (routine x 2)     Status: None (Preliminary result)   Collection Time: 05/27/14 12:12 PM  Result Value Ref Range Status   Specimen Description BLOOD RIGHT ANTECUBITAL  Final   Special Requests BOTTLES DRAWN AEROBIC AND ANAEROBIC 5ML  Final   Culture   Final           BLOOD CULTURE RECEIVED NO GROWTH TO DATE CULTURE WILL BE HELD FOR 5 DAYS BEFORE ISSUING A FINAL NEGATIVE REPORT Performed at [facility]    Report Status PENDING  Incomplete    Radiology Reports Dg Chest 2 View  05/28/2014   CLINICAL DATA:  Fever.  EXAM: CHEST  2 VIEW  COMPARISON:  05/27/2014 and 05/20/2014  FINDINGS: Lungs are hypoinflated minimal left retrocardiac opacification as cannot exclude atelectasis or infection. No evidence of effusion. Cardiomediastinal silhouette and remainder  of the exam is unchanged.  IMPRESSION: Hypoinflation with minimal left retrocardiac opacification which may be due to atelectasis or infection.   Electronically Signed   By: Marin Olp M.D.   On: 05/28/2014 10:07   Ct Head Wo Contrast  05/20/2014   CLINICAL DATA:  Confusion last 3 days  EXAM: CT HEAD WITHOUT CONTRAST  TECHNIQUE: Contiguous axial images were obtained from the base of the skull through the vertex without intravenous contrast.  COMPARISON:  01/29/2014  FINDINGS: No skull fracture is noted. Paranasal sinuses and mastoid air cells are unremarkable. There is mucosal thickening with complete opacification of the left maxillary sinus. The mastoid air cells are unremarkable.  No intracranial hemorrhage, mass effect or midline shift. Stable cerebral atrophy. Stable ventriculomegaly. Again noted mild periventricular and patchy subcortical white matter decreased attenuation consistent with chronic small vessel ischemic changes. No definite acute cortical infarction. No mass lesion is noted on this unenhanced scan. Atherosclerotic calcifications of carotid siphon.  IMPRESSION: 1. No acute intracranial abnormality. Stable cerebral atrophy and mild ventriculomegaly. Stable chronic white matter disease. No definite acute cortical infarction. 2. There is mucosal thickening with complete opacification of the left maxillary sinus.   Electronically Signed   By: Lahoma Crocker M.D.   On: 05/20/2014 14:58   Dg Chest Port 1 View  05/27/2014   CLINICAL DATA:  Acute onset of lethargy 2 weeks ago, somnolence, not eating or taking medications, history diabetes, hypertension  EXAM: PORTABLE CHEST - 1 VIEW  COMPARISON:  Portable exam 1127 hours compared to 05/20/2014  FINDINGS: Normal heart size, mediastinal contours and pulmonary vascularity for technique.  Minimal atelectasis at RIGHT base.  Lungs otherwise clear.  No pleural effusion or pneumothorax.  Osseous structures unremarkable.  IMPRESSION: Minimal RIGHT basilar  atelectasis.   Electronically Signed   By: Lavonia Dana M.D.   On: 05/27/2014 11:39   Dg Chest Port 1 View  05/20/2014   CLINICAL DATA:  Altered mental status.  Unspecified duration.  EXAM: PORTABLE CHEST - 1 VIEW  COMPARISON:  01/29/2014.  FINDINGS: Normal heart size. Clear lung fields. Mild bronchitic change. No active infiltrates or failure. Visualized skeletal structures are unremarkable. Similar appearance to priors.  IMPRESSION: No active cardiopulmonary disease.   Electronically Signed   By: Rolla Flatten M.D.   On: 05/20/2014 13:54    CBC  Recent Labs Lab 05/27/14 1049 05/27/14 1108 05/28/14 0533  WBC 12.4*  --  7.4  HGB 12.8 13.3 10.4*  HCT 40.2 39.0 32.8*  PLT 242  --  172  MCV 86.3  --  87.0  MCH 27.5  --  27.6  MCHC 31.8  --  31.7  RDW 14.2  --  14.0  LYMPHSABS 1.3  --   --   MONOABS 0.8  --   --   EOSABS 0.0  --   --   BASOSABS 0.0  --   --     Chemistries   Recent Labs Lab 05/27/14 1108 05/27/14 1209 05/28/14 0533 05/29/14 0424  NA 143 146* 146* 142  K 7.1* 4.9 4.2 3.1*  CL 112 111 115* 112  CO2  --  24 25 22   GLUCOSE 265* 198* 111* 79  BUN 54* 37* 36* 27*  CREATININE 1.20* 1.30* 1.08 0.85  CALCIUM  --  9.4 9.2 8.6  AST  --  21  --   --   ALT  --  14  --   --   ALKPHOS  --  72  --   --   BILITOT  --  0.6  --   --    ------------------------------------------------------------------------------------------------------------------ estimated creatinine clearance is 62.3 mL/min (by C-G formula based on Cr of 0.85). ------------------------------------------------------------------------------------------------------------------ No results for input(s): HGBA1C in the last 72 hours. ------------------------------------------------------------------------------------------------------------------ No results for input(s): CHOL, HDL, LDLCALC, TRIG, CHOLHDL, LDLDIRECT in the last 72  hours. ------------------------------------------------------------------------------------------------------------------ No results for input(s): TSH, T4TOTAL, T3FREE, THYROIDAB in the last 72 hours.  Invalid input(s): FREET3 ------------------------------------------------------------------------------------------------------------------ No results for input(s): VITAMINB12, FOLATE, FERRITIN, TIBC, IRON, RETICCTPCT in the last 72 hours.  Coagulation profile No results for input(s): INR, PROTIME in the last 168 hours.  No results for input(s): DDIMER in the last 72 hours.  Cardiac Enzymes No results for input(s): CKMB, TROPONINI, MYOGLOBIN in the last 168 hours.  Invalid input(s): CK ------------------------------------------------------------------------------------------------------------------ Invalid input(s): Alvan  05/28/14 1151 05/28/14 1744 05/28/14 1801 05/28/14 1810 05/28/14 2032 05/29/14 0654  GLUCAP 134* 53* 83 48 86 22     Myana Schlup M.D. [facility] 05/29/2014, 10:04 AM  Pager: [phone]   Between 7am to 7pm - call Pager - [phone]  After 7pm go to [email or web address] - password [email or web address]  Call night coverage person covering after 7pm

## 2014-05-29 NOTE — Progress Notes (Signed)
Verbal order per NP K.Schorr to In and out patient. Order carried out. Patient tolerated well.  Immediate return of 700 cc's of clear yellow urine. Urine specimen sent for culture. Will continue to monitor closely.

## 2014-05-29 NOTE — Progress Notes (Signed)
Patient has not voided since 6:30 pm. Pt stated "It feels like I have to go, but nothing will come out." Patient denies abdominal pain, abdomen is soft to touch. Bladder performed and showed >200 ml. Pt placed on bed pan, but did not void. Will continue to monitor pt closely.

## 2014-05-29 NOTE — Progress Notes (Signed)
Pt threw up twice within two hours.  Emesis looked like the food she just ate.  Gave zofran and will continue to monitor.

## 2014-05-29 NOTE — Progress Notes (Signed)
Pt blood pressure 172/86.  MD aware.

## 2014-05-29 NOTE — Progress Notes (Signed)
4/7 am : Urinary retention issue, bladder scan showed> 200 mL, in and out cath done 700 mL out at 0300.  Got pt up twice to [facility] to try to urinate, pt unable to void.  Bladder scan showed 315cc's of urine.  MD aware.  Order for foley to be placed per MD.

## 2014-05-30 DIAGNOSIS — R269 Unspecified abnormalities of gait and mobility: Secondary | ICD-10-CM

## 2014-05-30 LAB — URINE CULTURE
CULTURE: NO GROWTH
Colony Count: NO GROWTH
Special Requests: NORMAL

## 2014-05-30 LAB — BASIC METABOLIC PANEL
Anion gap: 7 (ref 5–15)
BUN: 13 mg/dL (ref 6–23)
CHLORIDE: 108 mmol/L (ref 96–112)
CO2: 26 mmol/L (ref 19–32)
Calcium: 8.9 mg/dL (ref 8.4–10.5)
Creatinine, Ser: 0.81 mg/dL (ref 0.50–1.10)
GFR calc non Af Amer: 69 mL/min — ABNORMAL LOW (ref >90)
GFR, EST AFRICAN AMERICAN: 80 mL/min — AB (ref >90)
Glucose, Bld: 67 mg/dL — ABNORMAL LOW (ref 70–99)
POTASSIUM: 2.8 mmol/L — AB (ref 3.5–5.1)
SODIUM: 141 mmol/L (ref 135–145)

## 2014-05-30 LAB — HEMOGLOBIN A1C
Hgb A1c MFr Bld: 8.1 % — ABNORMAL HIGH (ref 4.8–5.6)
Mean Plasma Glucose: 186 mg/dL

## 2014-05-30 LAB — POTASSIUM: Potassium: 4.1 mmol/L (ref 3.5–5.1)

## 2014-05-30 LAB — MRSA PCR SCREENING: MRSA BY PCR: NEGATIVE

## 2014-05-30 LAB — GLUCOSE, CAPILLARY
GLUCOSE-CAPILLARY: 104 mg/dL — AB (ref 70–99)
GLUCOSE-CAPILLARY: 193 mg/dL — AB (ref 70–99)
GLUCOSE-CAPILLARY: 271 mg/dL — AB (ref 70–99)
Glucose-Capillary: 63 mg/dL — ABNORMAL LOW (ref 70–99)
Glucose-Capillary: 197 mg/dL — ABNORMAL HIGH (ref 70–99)

## 2014-05-30 MED ORDER — POTASSIUM CHLORIDE CRYS ER 20 MEQ PO TBCR
40.0000 meq | EXTENDED_RELEASE_TABLET | Freq: Two times a day (BID) | ORAL | Status: AC
  Administered 2014-05-30 (×2): 40 meq via ORAL
  Filled 2014-05-30 (×2): qty 2

## 2014-05-30 MED ORDER — POTASSIUM CHLORIDE 10 MEQ/100ML IV SOLN
10.0000 meq | INTRAVENOUS | Status: AC
  Administered 2014-05-30 (×3): 10 meq via INTRAVENOUS
  Filled 2014-05-30 (×3): qty 100

## 2014-05-30 MED ORDER — INSULIN ASPART 100 UNIT/ML ~~LOC~~ SOLN: 0.0000 [IU] | Freq: Three times a day (TID) | SUBCUTANEOUS | Status: DC

## 2014-05-30 MED ORDER — DEXTROSE-NACL 5-0.45 % IV SOLN
INTRAVENOUS | Status: DC
  Administered 2014-05-30: 08:00:00 via INTRAVENOUS

## 2014-05-30 MED ORDER — ENSURE ENLIVE PO LIQD: 237.0000 mL | Freq: Two times a day (BID) | ORAL | Status: DC

## 2014-05-30 MED ORDER — POTASSIUM CHLORIDE IN NACL 40-0.9 MEQ/L-% IV SOLN
INTRAVENOUS | Status: DC
  Filled 2014-05-30: qty 1000

## 2014-05-30 MED ORDER — MIRTAZAPINE 15 MG PO TBDP: 7.5000 mg | ORAL_TABLET | Freq: Every day | ORAL | Status: DC

## 2014-05-30 MED ORDER — SODIUM CHLORIDE 0.9 % IV SOLN: INTRAVENOUS | Status: DC

## 2014-05-30 MED ORDER — MIRTAZAPINE 15 MG PO TBDP
7.5000 mg | ORAL_TABLET | Freq: Every day | ORAL | Status: DC
  Administered 2014-05-30: 7.5 mg via ORAL
  Filled 2014-05-30: qty 0.5

## 2014-05-30 NOTE — Progress Notes (Signed)
Patient to return to [facility] at discharge. Anticipating discharge this afternoon after potassium is rechecked. Jenny Reichmann at [facility] aware.      Raynaldo Opitz, [facility] Clinical Social Worker cell #: [phone]

## 2014-05-30 NOTE — Progress Notes (Signed)
Hypoglycemic Event  CBG: 63  Treatment: 15 GM carbohydrate snack  Symptoms: None  Follow-up CBG: Time: 0815 CBG Result:104  Possible Reasons for Event: Inadequate meal intake  Comments/MD notified:Dr. Rai notified.     [name]  Remember to initiate Hypoglycemia Order Set & complete

## 2014-05-30 NOTE — Progress Notes (Signed)
Physical Therapy Treatment Patient Details Name: [name] MRN: [id number] DOB: 09-06-1940 Today's Date: 05/30/2014    History of Present Illness 74 yo female with onset of AMS changes and UTI with reudced endurance, hypoinflation of lungs and AKI was noted with PMHx:  DM, HTN    PT Comments    Pt treatment was limited by fatigue today. Pt able to complete bed mobility and transfers with assistance, ambulation was not completed due to pt fatigue. Assist was needed to help pt maintain balance during static standing and transfers, pt kept losing balance posteriorly. PT is recommending Home Health and 24 hour supervision at ALF if the facility is capable. If ALF is not capable, pt would benefit from SNF.   Follow Up Recommendations  Home health PT;Supervision/Assistance - 24 hour (dependent on ALF ability)     Equipment Recommendations  None recommended by PT    Recommendations for Other Services       Precautions / Restrictions Precautions Precautions: Fall Restrictions Weight Bearing Restrictions: No    Mobility  Bed Mobility Overal bed mobility: Needs Assistance Bed Mobility: Supine to Sit     Supine to sit: Mod assist     General bed mobility comments: assist needed to move LEs OOB. bed pad used to slide pt towards EOB.   Transfers Overall transfer level: Needs assistance Equipment used: Standard walker Transfers: Sit to/from Stand Sit to Stand: Mod assist;+2 physical assistance Stand pivot transfers: Max assist;+2 physical assistance       General transfer comment: verbal cues given for hand placement. assist for rise and controlling descent and to help pt maintain balance during stand pivot transfer, pt kept leaning posteriorly.   Ambulation/Gait             General Gait Details: pt unable to tolerate ambulation today due to fatigue from static standing while being cleaned and transfers before hand.    Stairs            Wheelchair  Mobility    Modified Rankin (Stroke Patients Only)       Balance Overall balance assessment: Needs assistance       Postural control: Posterior lean Standing balance support: Bilateral upper extremity supported Standing balance-Leahy Scale: Poor Standing balance comment: poor balance possibly due to fatigue from having to stand for a long time while being cleaned.                     Cognition Arousal/Alertness: Awake/alert Behavior During Therapy: WFL for tasks assessed/performed Overall Cognitive Status: Impaired/Different from baseline                      Exercises      General Comments        Pertinent Vitals/Pain Pain Assessment: No/denies pain    Home Living                      Prior Function            PT Goals (current goals can now be found in the care plan section) Acute Rehab PT Goals Patient Stated Goal: To go home to ALF PT Goal Formulation: With patient/family Time For Goal Achievement: 06/11/14 Potential to Achieve Goals: Good Progress towards PT goals: Progressing toward goals    Frequency  Min 3X/week    PT Plan Current plan remains appropriate    Co-evaluation  End of Session Equipment Utilized During Treatment: Gait belt Activity Tolerance: Patient limited by fatigue Patient left: in chair;with call bell/phone within reach;with nursing/sitter in room;with chair alarm set     Time: 6701-4103 PT Time Calculation (min) (ACUTE ONLY): 10 min  Charges:  $Therapeutic Activity: 8-22 mins                    G Codes:      Montgomery,James 27-Jun-2014, 1:40 PM Charlott Holler, SPT Clide Dales, PT Pager: [phone] 06-27-2014

## 2014-05-30 NOTE — Progress Notes (Addendum)
Patient is set to discharge back to [facility] today. Patient & daughter, [name] (ph#: [phone]) aware. Discharge packet given to RN, Archer Asa. [facility] called for transport.     Raynaldo Opitz, [facility] Clinical Social Worker cell #: [phone]

## 2014-05-30 NOTE — Progress Notes (Signed)
Attempted to call report to facility RN.  Receptionist took my name and number and stated that nurse would call this RN back.  Awaiting call to give report.

## 2014-05-30 NOTE — Progress Notes (Signed)
Nurse Secretary reports Dr. Buel Ream office said they would get in touch with patient's daughter on Monday to check on patient and depending on what patient's daughter reports the office may get her an appointment sooner.  Nurse secretary spoke with D.J from Dr. Buel Ream office.

## 2014-05-30 NOTE — Progress Notes (Signed)
Attempted to call report again x2.  Gave report to Tenny Craw, RN.  Will continue to monitor patient until shift change.

## 2014-05-30 NOTE — Progress Notes (Signed)
NUTRITION FOLLOW-UP  Pt meets criteria for non-severe (moderate) MALNUTRITION in the context of chronic illness as evidenced by 19% body weight loss in 11 months, mild/moderate fat and muscle depletion.  DOCUMENTATION CODES Per approved criteria  -Non-severe (moderate) malnutrition in the context of chronic illness   INTERVENTION: Encourage PO intake Provide Ensure Enlive po BID, each supplement provides 350 kcal and 20 grams of protein   NUTRITION DIAGNOSIS: Unintentional weight loss related to decreased appetite and PO intakes as evidenced by daughter's report per H&P, chart review, 42 lbs (19.1 kg) weight loss (19% body weight) in 11 months.  Goal: Pt to meet >/= 90% estimated needs. -meeting  Monitor:  PO intakes, supplement use, labs, weight trends  74 y.o. female  Admitting Dx: Acute encephalopathy  ASSESSMENT: 74 y.o. Female with hx which includes cognitive impairment and DM. Pt with delayed speech admitted from a facility. Per H&P, daughter states that pt has lost weight over the past 30 days and that for about 1 week PTA she was having decreased food and drink intakes and increased day-time drowsiness.  Pt currently presents with acute encephalopathy likely due to sepsis, AKI, UTI, dehydration, and lactic acidosis. Per chart review, pt ate 90% breakfast, 75% lunch and 5% of dinner yesterday. She had 2 episodes of emesis at the time of dinner which RN note describes as being the food pt had just consumed. Per rounds this AM, pt eating well with no issues. She had 100% of breakfast which RN reports was oatmeal and orange juice. Noted that pt had a hypoglycemic event at 7:30 this AM and was given 15 grams of carbohydrate at that time.   Pt noted to be alert and oriented x2 and is sleeping at this time. Spoke with daughter who reports pt has been eating well but she is unsure of Ensure intakes. She states PTA pt was depressed due to being in the nursing home and husband being at  home with the daughter; daughter confirms significant weight loss related to this. She also confirms that PCP had recommended pt consume nutrition supplement PTA.  Diet advanced from FLD to Soft at 10 AM today and is likely meeting needs on average at this time. Labs reviewed; will monitor for dehydration based on Na and Cl elevation and renal function based on elevated BUN and GFR: 57.   Height: Ht Readings from Last 1 Encounters:  05/27/14 5\' 7"  (1.702 m)    Weight: Wt Readings from Last 1 Encounters:  05/27/14 176 lb 9.4 oz (80.1 kg)    Ideal Body Weight: 135 lbs  % Ideal Body Weight: 130%  Wt Readings from Last 10 Encounters:  05/27/14 176 lb 9.4 oz (80.1 kg)  07/18/13 218 lb 0.6 oz (98.9 kg)  04/29/13 208 lb (94.348 kg)  12/28/12 215 lb (97.523 kg)  04/18/12 219 lb 9.3 oz (99.6 kg)  06/15/10 194 lb (87.998 kg)  06/03/10 180 lb (81.647 kg)  10/29/09 199 lb (90.266 kg)  04/30/09 196 lb (88.905 kg)  11/04/08 203 lb (92.08 kg)    Usual Body Weight: 208 lbs  % Usual Body Weight: 85%  BMI:  Body mass index is 27.65 kg/(m^2).  Estimated Nutritional Needs: Kcal: 1600-2000 Protein: 80-100 grams Fluid: 2L/day  Skin: WDL  Diet Order: DIET SOFT Room service appropriate?: Yes; Fluid consistency:: Thin     Intake/Output Summary (Last 24 hours) at 05/30/14 1010 Last data filed at 05/30/14 0845  Gross per 24 hour  Intake 2733.34 ml  Output  5252 ml  Net -2518.66 ml    Last BM: PTA, no new documentation of BM since admission  Labs:   Recent Labs Lab 05/28/14 0533 05/29/14 0424 05/30/14 0701  NA 146* 142 141  K 4.2 3.1* 2.8*  CL 115* 112 108  CO2 25 22 26   BUN 36* 27* 13  CREATININE 1.08 0.85 0.81  CALCIUM 9.2 8.6 8.9  GLUCOSE 111* 79 67*    CBG (last 3)   Recent Labs  05/29/14 1624 05/29/14 2048 05/30/14 0724  GLUCAP 92 85 63*    Scheduled Meds: . aspirin  81 mg Oral Daily  . cefTRIAXone (ROCEPHIN)  IV  1 g Intravenous Q24H  . enoxaparin  (LOVENOX) injection  40 mg Subcutaneous Q24H  . feeding supplement (ENSURE ENLIVE)  237 mL Oral BID BM  . insulin aspart  0-5 Units Subcutaneous QHS  . insulin aspart  0-9 Units Subcutaneous TID WC  . losartan  100 mg Oral Daily  . magnesium oxide  400 mg Oral Daily  . metoCLOPramide  5 mg Oral BID AC  . metoprolol succinate  100 mg Oral Daily  . mirtazapine  7.5 mg Oral QHS  . ondansetron  4 mg Oral TID  . pantoprazole (PROTONIX) IV  40 mg Intravenous Q12H  . polyethylene glycol  17 g Oral Daily  . potassium chloride  10 mEq Intravenous Q1 Hr x 3  . potassium chloride  40 mEq Oral BID  . pramipexole  0.375 mg Oral TID  . sertraline  50 mg Oral Daily  . sodium chloride  3 mL Intravenous Q12H  . verapamil  180 mg Oral Daily    Continuous Infusions:    Past Medical History  Diagnosis Date  . Anxiety   . Dyspnea   . Diabetes mellitus   . Osteoarthritis   . Hypercholesteremia   . Depression   . RLS (restless legs syndrome)   . Stress   . PONV (postoperative nausea and vomiting)   . Headache(784.0)   . HTN (hypertension)     dr Johnsie Cancel    Past Surgical History  Procedure Laterality Date  . Colonoscopy    . Abdominal hysterectomy    . Lumbar laminectomy/decompression microdiscectomy N/A 04/18/2012    Procedure: LUMBAR LAMINECTOMY/DECOMPRESSION MICRODISCECTOMY 3 LEVELS;  Surgeon: Winfield Cunas, MD;  Location: [facility];  Service: Neurosurgery;  Laterality: N/A;  Lumbar three-four, lumbar four-five, lumbar five-sacral one decompression. Synovial cyst resection lumbar four-five  . Joint replacement    . Left forearm fracture with orif Left   . Replacement total knee      right    Jarome Matin, RD, LDN Inpatient Clinical Dietitian Pager # [phone] After hours/weekend pager # [phone]

## 2014-05-30 NOTE — Discharge Summary (Signed)
Physician Discharge Summary   Patient ID: [name] MRN: [id number] DOB/AGE: 76/04/1940 76 y.o.  Admit date: 05/27/2014 Discharge date: 05/30/2014  Primary Care Physician:  [name], MD  Discharge Diagnoses:    . Acute kidney injury . Acute encephalopathy  Hypokalemia  Chronic nausea, vomiting secondary to possible gastroparesis  Diabetes mellitus with hypoglycemia episodes . Fever . UTI (lower urinary tract infection) . Sepsis . Essential hypertension . ANXIETY DEPRESSION GERD   Consults: NONE   Recommendations for Outpatient Follow-up:  Please avoid salads and eggs in patient's diet, patient's daughter reports causes trigger for her gastroparesis  Please take Foley out on Tuesday for voiding trial. If patient has difficulty with urination/urinary retention, patient will need urology referral    TESTS THAT NEED FOLLOW-UP CBC, BMET on Monday  DIET: Soft diet, carb modified    Allergies:   Allergies  Allergen Reactions  . Morphine And Related Other (See Comments)    hallucination   . Oxycodone Other (See Comments)    hallucination   . Sulfonamide Derivatives Swelling     Discharge Medications:   Medication List    STOP taking these medications        ALPRAZolam 0.5 MG tablet  Commonly known as:  XANAX     alum & mag hydroxide-simeth [phone] MG/5ML suspension  Commonly known as:  MAALOX/MYLANTA     cephALEXin 500 MG capsule  Commonly known as:  KEFLEX     diphenhydrAMINE 25 MG tablet  Commonly known as:  BENADRYL     famotidine 20 MG tablet  Commonly known as:  PEPCID     glipiZIDE 5 MG 24 hr tablet  Commonly known as:  GLUCOTROL XL     guaifenesin 100 MG/5ML syrup  Commonly known as:  ROBITUSSIN     insulin detemir 100 UNIT/ML injection  Commonly known as:  LEVEMIR     magnesium chloride 64 MG Tbec SR tablet  Commonly known as:  SLOW-MAG     magnesium hydroxide 400 MG/5ML suspension  Commonly known as:  MILK OF  MAGNESIA     metFORMIN 500 MG tablet  Commonly known as:  GLUCOPHAGE     predniSONE 20 MG tablet  Commonly known as:  DELTASONE      TAKE these medications        acetaminophen 500 MG tablet  Commonly known as:  TYLENOL  Take 500 mg by mouth every 4 (four) hours as needed for mild pain, fever or headache (99.5 to 101).     aspirin 81 MG chewable tablet  Chew 81 mg by mouth daily.     B-COMPLEX/B-12 PO  Take 1 tablet by mouth daily.     bisacodyl 10 MG suppository  Commonly known as:  DULCOLAX  Place 10 mg rectally as needed for moderate constipation.     cholecalciferol 1000 UNITS tablet  Commonly known as:  VITAMIN D  Take 1,000 Units by mouth daily.     Cranberry 500 MG Caps  Take 1,000 mg by mouth daily at 12 noon.     feeding supplement (ENSURE ENLIVE) Liqd  Take 237 mLs by mouth 2 (two) times daily between meals.     insulin aspart 100 UNIT/ML injection  Commonly known as:  novoLOG  Inject 0-9 Units into the skin 3 (three) times daily with meals. Sliding scale CBG 70 - 120: 0 units CBG 121 - 150: 1 unit,  CBG 151 - 200: 2 units,  CBG 201 - 250: 3 units,  CBG 251 - 300: 5 units,  CBG 301 - 350: 7 units,  CBG 351 - 400: 9 units   CBG > 400: 9 units and notify your MD     loperamide 2 MG capsule  Commonly known as:  IMODIUM  Take 2 mg by mouth as needed for diarrhea or loose stools. Do not exceed 8 doses in 24 hours     loratadine 10 MG tablet  Commonly known as:  CLARITIN  Take 10 mg by mouth daily as needed for allergies.     losartan 100 MG tablet  Commonly known as:  COZAAR  Take 100 mg by mouth daily.     Magnesium Oxide 500 MG Tabs  Take 1 tablet by mouth daily.     metoCLOPramide 5 MG tablet  Commonly known as:  REGLAN  Take 5 mg by mouth 2 (two) times daily before a meal.     metoprolol succinate 100 MG 24 hr tablet  Commonly known as:  TOPROL-XL  Take 100 mg by mouth daily.     mirtazapine 15 MG disintegrating tablet  Commonly known as:   REMERON SOL-TAB  Take 0.5 tablets (7.5 mg total) by mouth at bedtime.     neomycin-bacitracin-polymyxin [phone] ointment  Apply 1 application topically 4 (four) times daily as needed (skin tears, abrasions, minor irritations).     omeprazole 40 MG capsule  Commonly known as:  PRILOSEC  Take 40 mg by mouth daily.     ondansetron 4 MG tablet  Commonly known as:  ZOFRAN  Take 4 mg by mouth 3 (three) times daily.     polyethylene glycol packet  Commonly known as:  MIRALAX / GLYCOLAX  Take 17 g by mouth daily.     potassium chloride SA 20 MEQ tablet  Commonly known as:  K-DUR,KLOR-CON  Take 40 mEq by mouth daily.     Pramipexole Dihydrochloride 0.375 MG Tb24  Take 1 tablet by mouth 3 (three) times daily.     senna-docusate 8.6-50 MG per tablet  Commonly known as:  Senokot-S  Take 1 tablet by mouth at bedtime as needed for mild constipation.     sertraline 50 MG tablet  Commonly known as:  ZOLOFT  Take 50 mg by mouth daily.     traZODone 50 MG tablet  Commonly known as:  DESYREL  Take 50 mg by mouth at bedtime as needed for sleep.     UNABLE TO FIND  Take 1 Can by mouth 4 (four) times daily. Mighty Shakes     verapamil 240 MG CR tablet  Commonly known as:  CALAN-SR  Take 1 tablet (240 mg total) by mouth daily.     zinc oxide 11.3 % Crea cream  Commonly known as:  BALMEX  Apply 1 application topically 2 (two) times daily. To rash         Brief H and P: For complete details please refer to admission H and P, but in briefLouise G [name] is 74 y.o. female with a past medical history of cognitive impairment, presented as a transfer from her nursing facility to the emergency room with complaints of functional decline/failure to thrive over the past week. She was diagnosed with a urinary tract infection on 05/20/2014 as urinalysis showed the presence of many bacteria. Patient was treated with Keflex empirically. History obtained from her daughter who was present at  bedside as patient unable to provide history or participate in her own plan of care. Over the course of  the week she continued to decline, becoming less active, minimally verbal, having excessive daytime sleepiness, decreased by mouth intake. She has also had fevers and chills. At her facility she was found to be slumped over her food at the dining room table. Initial lab work performed in the emergency room today showed an elevated lactic acid of 3.26, white count of 12.4, found to have a temperature 101.5.   Hospital Course:  Acute encephalopathy; likely due to sepsis, UTI, acute kidney injury, dehydration, lactic acidosis superimposed on dementia - Patient alert and oriented 2, pleasant, close to her baseline status. She was placed on IV fluids and IV Rocephin (on day #4 prior to discharge). Urine culture however showed no growth, hence patient will not need any further antibiotics after hospitalization. Chest x-ray reviewed, hypoinflation with minimal left retrocardiac opacification, atelectasis or infection  Urinary tract infection, sepsis, acute urinary retention - Patient was diagnosed with UTI on 05/20/14 treated with Keflex however continued to have ongoing fevers and functional decline. Urine culture and sensitivities however showed no growth, blood cultures remained negative so far. Patient was placed on IV Rocephin at the time of admission, continued to discharge, she does not need any further antibiotics. She has not spiked any fevers or chills, her mental status has been improving.  She did have episode of urinary retention on 4/7, Foley catheter was placed, can be discontinued on Tuesday, 4/12 at the facility, if patient has difficulty with voiding, recommend urology referral.  Acute kidney injury with dehydration, lactic acidosis, hypernatremia 146, creatinine 1.3 at the time of admission  - Patient was placed on IV fluid hydration, lactic acid trending down,  creatinine improved to 0.8 at the time of discharge.    ANXIETY DEPRESSION - Per patient's daughter, patient appears to have loss of appetite and depression secondary to separation from her family at the facility. Patient's daughter hopes to have her mother back in the home with private caregivers. Patient's husband is a dialysis candidate and does not feel that she will be adequately supervised at home. I did start the patient on low-dose Remeron. Patient does eat well and has better mental status in the presence of her family.  Dementia with functional decline -  PT evaluation recommended home health PT, supervision 24 hours   Diabetes mellitus with hypoglycemia episodes Patient was noticed to have hypoglycemia episodes during hospitalization, she does not eat very well, mostly eats better in her family's supervision. I have discontinued Levemir and glipizide. Continue sliding scale insulin for now.  Hypertension - Currently stable, continue metoprolol, Cozaar  Episode of nausea/vomiting Patient had an episode of vomiting after eating salad on 4/7, KUB was negative for any SBO or ileus. Patient's daughter reported that this has happened for years after eating salad or eggs. 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 Avoid salad and eggs.  Hypokalemia This was aggressively replaced, potassium prior to discharge was 4.1    Day of Discharge BP 131/73 mmHg  Pulse 72  Temp(Src) 97.5 F (36.4 C) (Oral)  Resp 18  Ht 5\' 7"  (1.702 m)  Wt 80.1 kg (176 lb 9.4 oz)  BMI 27.65 kg/m2  SpO2 98%  Physical Exam: General: Alert and awake oriented not in any acute distress. HEENT: anicteric sclera, pupils reactive to light and accommodation CVS: S1-S2 clear no murmur rubs or gallops Chest: clear to auscultation bilaterally, no wheezing rales or rhonchi Abdomen: soft  nontender, nondistended, normal bowel  sounds Extremities: no cyanosis, clubbing or edema noted bilaterally Neuro: Cranial nerves II-XII intact, no focal neurological deficits   The results of significant diagnostics from this hospitalization (including imaging, microbiology, ancillary and laboratory) are listed below for reference.    LAB RESULTS: Basic Metabolic Panel:  Recent Labs Lab 05/29/14 0424 05/30/14 0701 05/30/14 1400  NA 142 141  --   K 3.1* 2.8* 4.1  CL 112 108  --   CO2 22 26  --   GLUCOSE 79 67*  --   BUN 27* 13  --   CREATININE 0.85 0.81  --   CALCIUM 8.6 8.9  --    Liver Function Tests:  Recent Labs Lab 05/27/14 1209  AST 21  ALT 14  ALKPHOS 72  BILITOT 0.6  PROT 6.9  ALBUMIN 3.5   No results for input(s): LIPASE, AMYLASE in the last 168 hours. No results for input(s): AMMONIA in the last 168 hours. CBC:  Recent Labs Lab 05/27/14 1049 05/27/14 1108 05/28/14 0533  WBC 12.4*  --  7.4  NEUTROABS 10.3*  --   --   HGB 12.8 13.3  13.3 10.4*  HCT 40.2 39.0  39.0 32.8*  MCV 86.3  --  87.0  PLT 242  --  172   Cardiac Enzymes: No results for input(s): CKTOTAL, CKMB, CKMBINDEX, TROPONINI in the last 168 hours. BNP: Invalid input(s): POCBNP CBG:  Recent Labs Lab 05/30/14 0817 05/30/14 1253  GLUCAP 104* 197*    Significant Diagnostic Studies:  Dg Chest 2 View  05/28/2014   CLINICAL DATA:  Fever.  EXAM: CHEST  2 VIEW  COMPARISON:  05/27/2014 and 05/20/2014  FINDINGS: Lungs are hypoinflated minimal left retrocardiac opacification as cannot exclude atelectasis or infection. No evidence of effusion. Cardiomediastinal silhouette and remainder of the exam is unchanged.  IMPRESSION: Hypoinflation with minimal left retrocardiac opacification which may be due to atelectasis or infection.   Electronically Signed   By: Marin Olp M.D.   On: 05/28/2014 10:07   Dg Chest Port 1 View  05/27/2014   CLINICAL DATA:  Acute onset of lethargy 2 weeks ago, somnolence, not eating or taking  medications, history diabetes, hypertension  EXAM: PORTABLE CHEST - 1 VIEW  COMPARISON:  Portable exam 1127 hours compared to 05/20/2014  FINDINGS: Normal heart size, mediastinal contours and pulmonary vascularity for technique.  Minimal atelectasis at RIGHT base.  Lungs otherwise clear.  No pleural effusion or pneumothorax.  Osseous structures unremarkable.  IMPRESSION: Minimal RIGHT basilar atelectasis.   Electronically Signed   By: Lavonia Dana M.D.   On: 05/27/2014 11:39    2D ECHO:   Disposition and Follow-up: Discharge Instructions    Diet Carb Modified    Complete by:  As directed      Increase activity slowly    Complete by:  As directed             DISPOSITION: ALF   DISCHARGE FOLLOW-UP Follow-up Information    Follow up with [name], MD. Schedule an appointment as soon as possible for a visit in 1 week.   Specialty:  Internal Medicine   Why:  for hospital follow-up   Contact information:   [place] [id number] [phone]        Time spent on Discharge: 35 mins  Signed:   RAI,RIPUDEEP M.D. [facility] 05/30/2014, 2:59 PM Pager: [phone]

## 2014-05-31 DIAGNOSIS — E162 Hypoglycemia, unspecified: Secondary | ICD-10-CM | POA: Diagnosis not present

## 2014-05-31 DIAGNOSIS — G9340 Encephalopathy, unspecified: Secondary | ICD-10-CM | POA: Diagnosis not present

## 2014-05-31 DIAGNOSIS — R0989 Other specified symptoms and signs involving the circulatory and respiratory systems: Secondary | ICD-10-CM | POA: Diagnosis not present

## 2014-05-31 DIAGNOSIS — K3184 Gastroparesis: Secondary | ICD-10-CM | POA: Diagnosis not present

## 2014-05-31 DIAGNOSIS — R638 Other symptoms and signs concerning food and fluid intake: Secondary | ICD-10-CM | POA: Diagnosis not present

## 2014-05-31 DIAGNOSIS — B9562 Methicillin resistant Staphylococcus aureus infection as the cause of diseases classified elsewhere: Secondary | ICD-10-CM | POA: Diagnosis not present

## 2014-05-31 DIAGNOSIS — F418 Other specified anxiety disorders: Secondary | ICD-10-CM | POA: Diagnosis not present

## 2014-05-31 DIAGNOSIS — I10 Essential (primary) hypertension: Secondary | ICD-10-CM | POA: Diagnosis not present

## 2014-05-31 DIAGNOSIS — E11649 Type 2 diabetes mellitus with hypoglycemia without coma: Secondary | ICD-10-CM | POA: Diagnosis not present

## 2014-05-31 DIAGNOSIS — R4182 Altered mental status, unspecified: Secondary | ICD-10-CM | POA: Diagnosis not present

## 2014-05-31 DIAGNOSIS — R112 Nausea with vomiting, unspecified: Secondary | ICD-10-CM | POA: Diagnosis not present

## 2014-05-31 DIAGNOSIS — J302 Other seasonal allergic rhinitis: Secondary | ICD-10-CM | POA: Diagnosis not present

## 2014-05-31 DIAGNOSIS — F341 Dysthymic disorder: Secondary | ICD-10-CM | POA: Diagnosis not present

## 2014-05-31 DIAGNOSIS — N179 Acute kidney failure, unspecified: Secondary | ICD-10-CM | POA: Diagnosis not present

## 2014-05-31 DIAGNOSIS — E559 Vitamin D deficiency, unspecified: Secondary | ICD-10-CM | POA: Diagnosis not present

## 2014-05-31 DIAGNOSIS — R05 Cough: Secondary | ICD-10-CM | POA: Diagnosis not present

## 2014-05-31 DIAGNOSIS — K219 Gastro-esophageal reflux disease without esophagitis: Secondary | ICD-10-CM | POA: Diagnosis not present

## 2014-05-31 DIAGNOSIS — R339 Retention of urine, unspecified: Secondary | ICD-10-CM | POA: Diagnosis not present

## 2014-05-31 DIAGNOSIS — Z452 Encounter for adjustment and management of vascular access device: Secondary | ICD-10-CM | POA: Diagnosis not present

## 2014-05-31 DIAGNOSIS — M6281 Muscle weakness (generalized): Secondary | ICD-10-CM | POA: Diagnosis not present

## 2014-05-31 DIAGNOSIS — E1165 Type 2 diabetes mellitus with hyperglycemia: Secondary | ICD-10-CM | POA: Diagnosis not present

## 2014-05-31 DIAGNOSIS — N390 Urinary tract infection, site not specified: Secondary | ICD-10-CM | POA: Diagnosis not present

## 2014-05-31 DIAGNOSIS — A419 Sepsis, unspecified organism: Secondary | ICD-10-CM | POA: Diagnosis not present

## 2014-05-31 DIAGNOSIS — F0390 Unspecified dementia without behavioral disturbance: Secondary | ICD-10-CM | POA: Diagnosis not present

## 2014-05-31 DIAGNOSIS — R069 Unspecified abnormalities of breathing: Secondary | ICD-10-CM | POA: Diagnosis not present

## 2014-05-31 DIAGNOSIS — E876 Hypokalemia: Secondary | ICD-10-CM | POA: Diagnosis not present

## 2014-05-31 DIAGNOSIS — R627 Adult failure to thrive: Secondary | ICD-10-CM | POA: Diagnosis not present

## 2014-05-31 DIAGNOSIS — F419 Anxiety disorder, unspecified: Secondary | ICD-10-CM | POA: Diagnosis not present

## 2014-05-31 DIAGNOSIS — R279 Unspecified lack of coordination: Secondary | ICD-10-CM | POA: Diagnosis not present

## 2014-05-31 DIAGNOSIS — R1311 Dysphagia, oral phase: Secondary | ICD-10-CM | POA: Diagnosis not present

## 2014-05-31 DIAGNOSIS — R509 Fever, unspecified: Secondary | ICD-10-CM | POA: Diagnosis not present

## 2014-05-31 LAB — BASIC METABOLIC PANEL
ANION GAP: 5 (ref 5–15)
BUN: 25 mg/dL — AB (ref 6–23)
CO2: 24 mmol/L (ref 19–32)
CREATININE: 0.98 mg/dL (ref 0.50–1.10)
Calcium: 8.6 mg/dL (ref 8.4–10.5)
Chloride: 109 mmol/L (ref 96–112)
GFR, EST AFRICAN AMERICAN: 64 mL/min — AB (ref >90)
GFR, EST NON AFRICAN AMERICAN: 55 mL/min — AB (ref >90)
GLUCOSE: 180 mg/dL — AB (ref 70–99)
Potassium: 3.8 mmol/L (ref 3.5–5.1)
Sodium: 138 mmol/L (ref 135–145)

## 2014-05-31 MED ORDER — BETHANECHOL CHLORIDE 25 MG PO TABS: 25.0000 mg | ORAL_TABLET | Freq: Four times a day (QID) | ORAL | Status: DC

## 2014-05-31 MED ORDER — BETHANECHOL CHLORIDE 25 MG PO TABS
25.0000 mg | ORAL_TABLET | Freq: Four times a day (QID) | ORAL | Status: DC
  Administered 2014-05-31 (×2): 25 mg via ORAL
  Filled 2014-05-31 (×3): qty 1

## 2014-05-31 MED ORDER — BETHANECHOL CHLORIDE 25 MG PO TABS
25.0000 mg | ORAL_TABLET | Freq: Four times a day (QID) | ORAL | Status: DC
  Filled 2014-05-31 (×3): qty 1

## 2014-05-31 MED ORDER — INSULIN DETEMIR 100 UNIT/ML ~~LOC~~ SOLN: 7.0000 [IU] | Freq: Every day | SUBCUTANEOUS | Status: DC

## 2014-05-31 MED ORDER — INSULIN DETEMIR 100 UNIT/ML ~~LOC~~ SOLN
7.0000 [IU] | Freq: Every day | SUBCUTANEOUS | Status: DC
  Filled 2014-05-31: qty 0.07

## 2014-05-31 NOTE — Discharge Summary (Signed)
Physician Discharge Summary   Patient ID: [name] MRN: [id number] DOB/AGE: May 28, 1940 74 y.o.  Admit date: 05/27/2014 Discharge date: 05/31/2014  Primary Care Physician:  [name], MD  Discharge Diagnoses:    . Acute kidney injury . Acute encephalopathy  Acute urinary retention  Hypokalemia  Chronic nausea, vomiting secondary to possible gastroparesis   Diabetes mellitus with hypoglycemia episodes . Fever . UTI (lower urinary tract infection) . Sepsis . Essential hypertension . ANXIETY DEPRESSION GERD   Consults:  Urology, Dr. Jeffie Pollock via phone consultation   Recommendations for Outpatient Follow-up:  Please avoid salads and eggs in patient's diet, patient's daughter reports causes trigger for her gastroparesis  Please take Foley out on Monday 4/11 for voiding trial. If patient has difficulty with urination/urinary retention, please put the Foley catheter back and make an appointment with Dr Irine Seal in [facility] within 1 week.    Please note patient had hypoglycemia episodes during hospitalization hence glipizide and Levemir was discontinued. Restarted Levemir at 7 units QHS, please adjust insulin regimen according to blood sugar readings.    TESTS THAT NEED FOLLOW-UP CBC, BMET next week  DIET: Soft diet, carb modified    Allergies:   Allergies  Allergen Reactions  . Morphine And Related Other (See Comments)    hallucination   . Oxycodone Other (See Comments)    hallucination   . Sulfonamide Derivatives Swelling     Discharge Medications:   Medication List    STOP taking these medications        ALPRAZolam 0.5 MG tablet  Commonly known as:  XANAX     alum & mag hydroxide-simeth 409-811-91 MG/5ML suspension  Commonly known as:  MAALOX/MYLANTA     cephALEXin 500 MG capsule  Commonly known as:  KEFLEX     diphenhydrAMINE 25 MG tablet  Commonly known as:  BENADRYL     famotidine 20 MG tablet  Commonly known as:   PEPCID     glipiZIDE 5 MG 24 hr tablet  Commonly known as:  GLUCOTROL XL     guaifenesin 100 MG/5ML syrup  Commonly known as:  ROBITUSSIN     magnesium chloride 64 MG Tbec SR tablet  Commonly known as:  SLOW-MAG     magnesium hydroxide 400 MG/5ML suspension  Commonly known as:  MILK OF MAGNESIA     metFORMIN 500 MG tablet  Commonly known as:  GLUCOPHAGE     predniSONE 20 MG tablet  Commonly known as:  DELTASONE      TAKE these medications        acetaminophen 500 MG tablet  Commonly known as:  TYLENOL  Take 500 mg by mouth every 4 (four) hours as needed for mild pain, fever or headache (99.5 to 101).     aspirin 81 MG chewable tablet  Chew 81 mg by mouth daily.     B-COMPLEX/B-12 PO  Take 1 tablet by mouth daily.     bethanechol 25 MG tablet  Commonly known as:  URECHOLINE  Take 1 tablet (25 mg total) by mouth 4 (four) times daily. X 2 days     bisacodyl 10 MG suppository  Commonly known as:  DULCOLAX  Place 10 mg rectally as needed for moderate constipation.     cholecalciferol 1000 UNITS tablet  Commonly known as:  VITAMIN D  Take 1,000 Units by mouth daily.     Cranberry 500 MG Caps  Take 1,000 mg by mouth daily at 12 noon.  feeding supplement (ENSURE ENLIVE) Liqd  Take 237 mLs by mouth 2 (two) times daily between meals.     insulin aspart 100 UNIT/ML injection  Commonly known as:  novoLOG  Inject 0-9 Units into the skin 3 (three) times daily with meals. Sliding scale CBG 70 - 120: 0 units CBG 121 - 150: 1 unit,  CBG 151 - 200: 2 units,  CBG 201 - 250: 3 units,  CBG 251 - 300: 5 units,  CBG 301 - 350: 7 units,  CBG 351 - 400: 9 units   CBG > 400: 9 units and notify your MD     insulin detemir 100 UNIT/ML injection  Commonly known as:  LEVEMIR  Inject 0.07 mLs (7 Units total) into the skin at bedtime.     loperamide 2 MG capsule  Commonly known as:  IMODIUM  Take 2 mg by mouth as needed for diarrhea or loose stools. Do not exceed 8 doses in 24 hours      loratadine 10 MG tablet  Commonly known as:  CLARITIN  Take 10 mg by mouth daily as needed for allergies.     losartan 100 MG tablet  Commonly known as:  COZAAR  Take 100 mg by mouth daily.     Magnesium Oxide 500 MG Tabs  Take 1 tablet by mouth daily.     metoCLOPramide 5 MG tablet  Commonly known as:  REGLAN  Take 5 mg by mouth 2 (two) times daily before a meal.     metoprolol succinate 100 MG 24 hr tablet  Commonly known as:  TOPROL-XL  Take 100 mg by mouth daily.     mirtazapine 15 MG disintegrating tablet  Commonly known as:  REMERON SOL-TAB  Take 0.5 tablets (7.5 mg total) by mouth at bedtime.     neomycin-bacitracin-polymyxin [phone] ointment  Apply 1 application topically 4 (four) times daily as needed (skin tears, abrasions, minor irritations).     omeprazole 40 MG capsule  Commonly known as:  PRILOSEC  Take 40 mg by mouth daily.     ondansetron 4 MG tablet  Commonly known as:  ZOFRAN  Take 4 mg by mouth 3 (three) times daily.     polyethylene glycol packet  Commonly known as:  MIRALAX / GLYCOLAX  Take 17 g by mouth daily.     potassium chloride SA 20 MEQ tablet  Commonly known as:  K-DUR,KLOR-CON  Take 40 mEq by mouth daily.     Pramipexole Dihydrochloride 0.375 MG Tb24  Take 1 tablet by mouth 3 (three) times daily.     senna-docusate 8.6-50 MG per tablet  Commonly known as:  Senokot-S  Take 1 tablet by mouth at bedtime as needed for mild constipation.     sertraline 50 MG tablet  Commonly known as:  ZOLOFT  Take 50 mg by mouth daily.     traZODone 50 MG tablet  Commonly known as:  DESYREL  Take 50 mg by mouth at bedtime as needed for sleep.     UNABLE TO FIND  Take 1 Can by mouth 4 (four) times daily. Mighty Shakes     verapamil 240 MG CR tablet  Commonly known as:  CALAN-SR  Take 1 tablet (240 mg total) by mouth daily.     zinc oxide 11.3 % Crea cream  Commonly known as:  BALMEX  Apply 1 application topically 2 (two) times daily.  To rash         Brief H and P: For  complete details please refer to admission H and P, but in briefLouise G [name] is 74 y.o. female with a past medical history of cognitive impairment, presented as a transfer from her nursing facility to the emergency room with complaints of functional decline/failure to thrive over the past week. She was diagnosed with a urinary tract infection on 05/20/2014 as urinalysis showed the presence of many bacteria. Patient was treated with Keflex empirically. History obtained from her daughter who was present at bedside as patient unable to provide history or participate in her own plan of care. Over the course of the week she continued to decline, becoming less active, minimally verbal, having excessive daytime sleepiness, decreased by mouth intake. She has also had fevers and chills. At her facility she was found to be slumped over her food at the dining room table. Initial lab work performed in the emergency room today showed an elevated lactic acid of 3.26, white count of 12.4, found to have a temperature 101.5.   Hospital Course:  Acute encephalopathy; likely due to sepsis, UTI, acute kidney injury, dehydration, lactic acidosis superimposed on dementia - Patient alert and oriented 2, pleasant, close to her baseline status. She was placed on IV fluids and IV Rocephin (on day #4 prior to discharge). Urine culture however showed no growth, hence patient will not need any further antibiotics after hospitalization. Chest x-ray reviewed, hypoinflation with minimal left retrocardiac opacification, atelectasis or infection  Urinary tract infection, sepsis, acute urinary retention  Patient was diagnosed with UTI on 05/20/14 treated with Keflex however continued to have ongoing fevers and functional decline. Urine culture and sensitivities however showed no growth, blood cultures remained negative so far. Patient was placed on IV Rocephin at the time of  admission, continued to discharge, she does not need any further antibiotics. She has not spiked any fevers or chills, her mental status has been improving.  She did have episode of urinary retention on 4/7, bladder scan showed> 200 mL, in and out cath done 700 mL out, repeat bladder scanshowed 315 mL of urine, Foley catheter was placed. The plan was to discontinue on Tuesday 4/12. Patient was however not accepted by original facility due to the Foley catheter. I spoke with urology, on call, Dr. Jeffie Pollock today 4/9 and discussed in detail. Per Dr. 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 Dr. Jeffie Pollock recommended to place on bethanechol for 48 hours and plan of removing the Foley on Monday 4/11. If patient fails the voiding trial, placed back on Foley catheter and follow up outpatient in urology office in one week.   Acute kidney injury with dehydration, lactic acidosis, hypernatremia 146, creatinine 1.3 at the time of admission  - Na level improved to 138 at the time of discharge. Patient was placed on IV fluid hydration, lactic acid trended down, creatinine improved to 0.9 at the time of discharge.    ANXIETY DEPRESSION - Per patient's daughter, patient appears to have loss of appetite and depression secondary to separation from her family at the facility. Patient's daughter hopes to have her mother back in the home with private caregivers. Patient's husband is a dialysis candidate and does not feel that she will be adequately supervised at home. I did start the patient on low-dose Remeron. Patient does eat well and has better mental status in the presence of her family.  Dementia with functional decline -  PT evaluation recommended home health PT, supervision 24 hours, at present needs skilled nursing facility. Family unable to provide 24-hour supervision.    Diabetes mellitus with  hypoglycemia episodes Patient was noticed to have hypoglycemia episodes during hospitalization, she does not eat very well, mostly eats better in her family's supervision. I had discontinued Levemir and glipizide. Blood sugars have started trending up, last night was 271, restarted Levemir at 7 units at bedtime, continue sliding scale insulin. Please adjust insulin regimen according to blood sugar readings.   Hypertension - Currently stable, continue metoprolol, Cozaar  Episode of nausea/vomiting Patient had an episode of vomiting after eating salad on 4/7, KUB was negative for any SBO or ileus. Patient's daughter reported that this has happened for years after eating salad or eggs. 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 Avoid salad and eggs.  Hypokalemia Resolved, potassium 3.8     Day of Discharge BP 157/77 mmHg  Pulse 64  Temp(Src) 98.4 F (36.9 C) (Oral)  Resp 20  Ht 5\' 7"  (1.702 m)  Wt 80.1 kg (176 lb 9.4 oz)  BMI 27.65 kg/m2  SpO2 99%  Physical Exam: General: Alert and awake oriented not in any acute distress. HEENT: anicteric sclera, pupils reactive to light and accommodation CVS: S1-S2 clear no murmur rubs or gallops Chest: clear to auscultation bilaterally, no wheezing rales or rhonchi Abdomen: soft nontender, nondistended, normal bowel sounds Extremities: no cyanosis, clubbing or edema noted bilaterally Neuro: Cranial nerves II-XII intact, no focal neurological deficits   The results of significant diagnostics from this hospitalization (including imaging, microbiology, ancillary and laboratory) are listed below for reference.    LAB RESULTS: Basic Metabolic Panel:  Recent Labs Lab 05/30/14 0701 05/30/14 1400 05/31/14 0606  NA 141  --  138  K 2.8* 4.1 3.8  CL 108  --  109  CO2 26  --  24  GLUCOSE 67*  --  180*  BUN 13  --  25*  CREATININE 0.81  --  0.98  CALCIUM  8.9  --  8.6   Liver Function Tests:  Recent Labs Lab 05/27/14 1209  AST 21  ALT 14  ALKPHOS 72  BILITOT 0.6  PROT 6.9  ALBUMIN 3.5   No results for input(s): LIPASE, AMYLASE in the last 168 hours. No results for input(s): AMMONIA in the last 168 hours. CBC:  Recent Labs Lab 05/27/14 1049 05/27/14 1108 05/28/14 0533  WBC 12.4*  --  7.4  NEUTROABS 10.3*  --   --   HGB 12.8 13.3  13.3 10.4*  HCT 40.2 39.0  39.0 32.8*  MCV 86.3  --  87.0  PLT 242  --  172   Cardiac Enzymes: No results for input(s): CKTOTAL, CKMB, CKMBINDEX, TROPONINI in the last 168 hours. BNP: Invalid input(s): POCBNP CBG:  Recent Labs Lab 05/30/14 1813 05/30/14 2310  GLUCAP 193* 271*    Significant Diagnostic Studies:  Dg Chest 2 View  05/28/2014   CLINICAL DATA:  Fever.  EXAM: CHEST  2 VIEW  COMPARISON:  05/27/2014 and 05/20/2014  FINDINGS: Lungs are hypoinflated minimal left retrocardiac opacification as cannot exclude atelectasis or infection. No evidence of effusion. Cardiomediastinal silhouette and remainder of the exam is unchanged.  IMPRESSION: Hypoinflation with minimal left retrocardiac opacification which may be due to atelectasis or infection.   Electronically Signed   By: Marin Olp M.D.   On: 05/28/2014 10:07  Dg Chest Port 1 View  05/27/2014   CLINICAL DATA:  Acute onset of lethargy 2 weeks ago, somnolence, not eating or taking medications, history diabetes, hypertension  EXAM: PORTABLE CHEST - 1 VIEW  COMPARISON:  Portable exam 1127 hours compared to 05/20/2014  FINDINGS: Normal heart size, mediastinal contours and pulmonary vascularity for technique.  Minimal atelectasis at RIGHT base.  Lungs otherwise clear.  No pleural effusion or pneumothorax.  Osseous structures unremarkable.  IMPRESSION: Minimal RIGHT basilar atelectasis.   Electronically Signed   By: Lavonia Dana M.D.   On: 05/27/2014 11:39    2D ECHO:   Disposition and Follow-up: Discharge Instructions    Diet Carb  Modified    Complete by:  As directed      Increase activity slowly    Complete by:  As directed             DISPOSITION: Skilled nursing facility   DISCHARGE FOLLOW-UP Follow-up Information    Follow up with [name], MD. Schedule an appointment as soon as possible for a visit in 1 week.   Specialty:  Internal Medicine   Why:  for hospital follow-up   Contact information:   [place] [address] [phone]       Follow up with Malka So, MD. Schedule an appointment as soon as possible for a visit in 1 week.   Specialty:  Urology   Why:  for hospital follow-up   Contact information:   [place] [id number] [phone]        Time spent on Discharge: 35 mins  Signed:   Everardo Voris M.D. [facility] 05/31/2014, 11:12 AM Pager: [phone]

## 2014-05-31 NOTE — Progress Notes (Signed)
Spoke with Dr. Reynaldo Minium the Medical Director for [facility] department. Instructed to tell SW to contact daughter ASAP in regards  to finding a facility....... SW made aware

## 2014-05-31 NOTE — Progress Notes (Signed)
Spoke with Marry Guan Resident Manager(RM) at [phone]. She states that [facility] they would be practicing out of their scope to take the patient back with a foley. She said they do not have a nurse on their  Staff currently. She stated that the CNA's are not even allowed to empty foley's. Asked if the  Patient was able to empty cath could she come back. She said no.  RM inquired as to why the cath could not come out before d/c. Dicussed in layman terms urinary retention and the need for the foley until Tuesday. Spoke with SW. She is going to reach out to her Director. Continue with current plan of care...Marland KitchenMarland KitchenMarland Kitchen  Will await call back SW Mudlogger and SW Building surveyor. Will inform House coverage of the above

## 2014-05-31 NOTE — Progress Notes (Signed)
[facility]                                                                              Patient Demographics  [name], is a 74 y.o. female, DOB - 06/04/1940, [place] date - 05/27/2014   Admitting Physician Kelvin Cellar, MD  Outpatient Primary MD:  [name], MD  LOS - 4   Chief Complaint  Patient presents with  . Fever  . Altered Mental Status       Brief HPI   [name] is 74 y.o. female with a past medical history of cognitive impairment, presented as a transfer from her nursing facility to the emergency room with complaints of functional decline/failure to thrive over the past week. She was diagnosed with a urinary tract infection on 05/20/2014 as urinalysis showed the presence of many bacteria. Patient was treated with Keflex empirically. History obtained from her daughter who was present at bedside as patient unable to provide history or participate in her own plan of care. Over the course of the week she continued to decline, becoming less active, minimally verbal, having excessive daytime sleepiness, decreased by mouth intake. She has also had fevers and chills. At her facility she was found to be slumped over her food at the dining room table. Initial lab work performed in the emergency room today showed an elevated lactic acid of 3.26, white count of 12.4, found to have a temperature 101.5.  4/7 am : Urinary retention issue, bladder scan showed> 200 mL, in and out cath done 700 mL out, repeat bladder scanshowed 315 mL of urine, Foley catheter was placed  4/9: Patient was discharged to ALF with Foley catheter, with plan to remove on Tuesday 4/12 for a voiding trial. However patient was not accepted by the facility due to the Foley catheter and returned back.     Assessment & Plan    Principal Problem:   Acute encephalopathy; likely due to sepsis, UTI, acute kidney injury, dehydration, lactic  acidosis superimposed on dementia - Patient alert and oriented, pleasant, close to her baseline status - Chest x-ray reviewed, hypoinflation with minimal left retrocardiac opacification, atelectasis or infection - Urine cultures showed no growth, does not need any further antibiotics  Active Problems:  Urinary tract infection, sepsis, acute urinary retention -Patient was diagnosed with UTI on 05/20/14 treated with Keflex however continued to have ongoing fevers and functional decline. Urine culture and sensitivities however showed no growth, blood cultures remained negative so far. Patient was placed on IV Rocephin at the time of admission, finished 4 days of IV Rocephin, she does not need any further antibiotics. She has not spiked any fevers or chills, her mental status has been improving.  - She did have episode of urinary retention on 4/7, Foley catheter was placed, the plan was to discontinue on Tuesday 4/12. Patient was however not accepted by the facility due to the Foley catheter.  I spoke with urology, on call, Dr. Jeffie Pollock and discussed in detail. Per Dr. 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 Dr.  Wrenn recommended to place on bethanechol for 48 hours and plan of removing the Foley on Monday. If patient fails the voiding trial, placed back on Foley catheter and follow up outpatient in urology office in one week.  Acute kidney injury with dehydration, lactic acidosis - Lactic acid trending down, creatinine improved, 0.85,  IV fluids discontinued. - Currently stable  Dementia with functional decline -  PT evaluation and recommended home health PT     Diabetes mellitus with hypoglycemia episodes -  no hypoglycemia events since yesterday, continue Levemir 15 units, decreased sliding scale insulin to sensitive. We'll adjust according to blood sugars in next 24 hours    Hypertension - Currently stable, continue metoprolol, Cozaar  Episode of nausea/vomiting Patient had an episode of vomiting after eating salad on 4/7, KUB was negative for any SBO or ileus. Patient's daughter reported that this has happened for years after eating salad or eggs. 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 Avoid salad and eggs.  Hypokalemia This was aggressively replaced, potassium prior to discharge was 4.1  Code Status: DO NOT RESUSCITATE  Family Communication: Discussed in detail with the patient, all imaging results, lab results explained to the patient's daughter  yesterday    Disposition Plan: Back to skilled nursing facility, however daughter is also exploring possibility of home with private caregivers, she will discuss with patient's husband/her father. Social work updated. DC likely in a.m.  Time Spent in minutes  25 minutes  Procedures  CXR  Consults   none  DVT Prophylaxis  Lovenox  Medications  Scheduled Meds: . aspirin  81 mg Oral Daily  . bethanechol  25 mg Oral QID  . cefTRIAXone (ROCEPHIN)  IV  1 g Intravenous Q24H  . enoxaparin (LOVENOX) injection  40 mg Subcutaneous Q24H  . feeding supplement (ENSURE ENLIVE)  237 mL Oral BID BM  . insulin aspart  0-5 Units Subcutaneous QHS  . insulin aspart  0-9 Units Subcutaneous TID WC  . losartan  100 mg Oral Daily  . magnesium oxide  400 mg Oral Daily  . metoCLOPramide  5 mg Oral BID AC  . metoprolol succinate  100 mg Oral Daily  . mirtazapine  7.5 mg Oral QHS  . ondansetron  4 mg Oral TID  . pantoprazole (PROTONIX) IV  40 mg Intravenous Q12H  . polyethylene glycol  17 g Oral Daily  . pramipexole  0.375 mg Oral TID  . sertraline  50 mg Oral Daily  . sodium chloride  3 mL Intravenous Q12H  . verapamil  180 mg Oral Daily   Continuous Infusions:   PRN Meds:.acetaminophen **OR** acetaminophen, alum & mag  hydroxide-simeth, hydrALAZINE, [DISCONTINUED] ondansetron **OR** ondansetron (ZOFRAN) IV, senna-docusate, traZODone   Antibiotics   Anti-infectives    Start     Dose/Rate Route Frequency Ordered Stop   05/27/14 1615  cefTRIAXone (ROCEPHIN) 1 g in dextrose 5 % 50 mL IVPB     1 g 100 mL/hr over 30 Minutes Intravenous Every 24 hours 05/27/14 1605     05/27/14 1130  levofloxacin (LEVAQUIN) IVPB 500 mg     500 mg 100 mL/hr over 60 Minutes Intravenous  Once 05/27/14 1117 05/27/14 1311        Subjective:   [name] was seen and examined today. Continues to remain alert and awake, somewhat sleepy in the morning, per daughter patient is not a morning person. Patient denies dizziness, chest  pain, shortness of breath, abdominal pain, N/V/D/C, new weakness, numbess, tingling. No fevers or chills. Patient returned back from the facility due to Foley catheter issue.   Objective:   Blood pressure 157/77, pulse 64, temperature 98.4 F (36.9 C), temperature source Oral, resp. rate 20, height 5\' 7"  (1.702 m), weight 80.1 kg (176 lb 9.4 oz), SpO2 99 %.  Wt Readings from Last 3 Encounters:  05/27/14 80.1 kg (176 lb 9.4 oz)  07/18/13 98.9 kg (218 lb 0.6 oz)  04/29/13 94.348 kg (208 lb)     Intake/Output Summary (Last 24 hours) at 05/31/14 0944 Last data filed at 05/31/14 0526  Gross per 24 hour  Intake    760 ml  Output   1000 ml  Net   -240 ml    Exam  General: Alert and oriented x 2, NAD  HEENT:  PERRLA, EOMI, Anicteic Sclera, mucous membranes moist.   Neck: Supple, no JVD, no masses  CVS: S1 and S2 clear, regular rate and rhythm  Respiratory: Clear to auscultation bilaterally  Abdomen: Soft, nontender, nondistended, + bowel sounds  Ext: no cyanosis clubbing or edema  Neuro: AAOx3, Cr N's II- XII. Strength 5/5 upper and lower extremities bilaterally  Skin: No rashes  Psych: Normal affect and demeanor, alert and oriented x2   Data Review   Micro Results Recent  Results (from the past 240 hour(s))  Urine culture     Status: None   Collection Time: 05/27/14 10:58 AM  Result Value Ref Range Status   Specimen Description URINE, CLEAN CATCH  Final   Special Requests NONE  Final   Colony Count NO GROWTH Performed at [facility]   Final   Culture NO GROWTH Performed at [facility]   Final   Report Status 05/28/2014 FINAL  Final  Blood culture (routine x 2)     Status: None (Preliminary result)   Collection Time: 05/27/14 12:10 PM  Result Value Ref Range Status   Specimen Description BLOOD RIGHT FOREARM  Final   Special Requests BOTTLES DRAWN AEROBIC AND ANAEROBIC 1ML  Final   Culture   Final           BLOOD CULTURE RECEIVED NO GROWTH TO DATE CULTURE WILL BE HELD FOR 5 DAYS BEFORE ISSUING A FINAL NEGATIVE REPORT Performed at [facility]    Report Status PENDING  Incomplete  Blood culture (routine x 2)     Status: None (Preliminary result)   Collection Time: 05/27/14 12:12 PM  Result Value Ref Range Status   Specimen Description BLOOD RIGHT ANTECUBITAL  Final   Special Requests BOTTLES DRAWN AEROBIC AND ANAEROBIC 5ML  Final   Culture   Final           BLOOD CULTURE RECEIVED NO GROWTH TO DATE CULTURE WILL BE HELD FOR 5 DAYS BEFORE ISSUING A FINAL NEGATIVE REPORT Performed at [facility]    Report Status PENDING  Incomplete  Culture, Urine     Status: None   Collection Time: 05/29/14  3:17 AM  Result Value Ref Range Status   Specimen Description URINE, CATHETERIZED  Final   Special Requests Normal  Final   Colony Count NO GROWTH Performed at [facility]   Final   Culture NO GROWTH Performed at [facility]   Final   Report Status 05/30/2014 FINAL  Final  MRSA PCR Screening     Status: None   Collection Time: 05/30/14  5:41 AM  Result Value Ref  Range Status   MRSA by PCR NEGATIVE NEGATIVE Final    Comment:        The GeneXpert MRSA Assay (FDA approved for NASAL specimens only), is  one component of a comprehensive MRSA colonization surveillance program. It is not intended to diagnose MRSA infection nor to guide or monitor treatment for MRSA infections.     Radiology Reports Dg Chest 2 View  05/28/2014   CLINICAL DATA:  Fever.  EXAM: CHEST  2 VIEW  COMPARISON:  05/27/2014 and 05/20/2014  FINDINGS: Lungs are hypoinflated minimal left retrocardiac opacification as cannot exclude atelectasis or infection. No evidence of effusion. Cardiomediastinal silhouette and remainder of the exam is unchanged.  IMPRESSION: Hypoinflation with minimal left retrocardiac opacification which may be due to atelectasis or infection.   Electronically Signed   By: Marin Olp M.D.   On: 05/28/2014 10:07   Ct Head Wo Contrast  05/20/2014   CLINICAL DATA:  Confusion last 3 days  EXAM: CT HEAD WITHOUT CONTRAST  TECHNIQUE: Contiguous axial images were obtained from the base of the skull through the vertex without intravenous contrast.  COMPARISON:  01/29/2014  FINDINGS: No skull fracture is noted. Paranasal sinuses and mastoid air cells are unremarkable. There is mucosal thickening with complete opacification of the left maxillary sinus. The mastoid air cells are unremarkable.  No intracranial hemorrhage, mass effect or midline shift. Stable cerebral atrophy. Stable ventriculomegaly. Again noted mild periventricular and patchy subcortical white matter decreased attenuation consistent with chronic small vessel ischemic changes. No definite acute cortical infarction. No mass lesion is noted on this unenhanced scan. Atherosclerotic calcifications of carotid siphon.  IMPRESSION: 1. No acute intracranial abnormality. Stable cerebral atrophy and mild ventriculomegaly. Stable chronic white matter disease. No definite acute cortical infarction. 2. There is mucosal thickening with complete opacification of the left maxillary sinus.   Electronically Signed   By: Lahoma Crocker M.D.   On: 05/20/2014 14:58   Dg Chest  Port 1 View  05/27/2014   CLINICAL DATA:  Acute onset of lethargy 2 weeks ago, somnolence, not eating or taking medications, history diabetes, hypertension  EXAM: PORTABLE CHEST - 1 VIEW  COMPARISON:  Portable exam 1127 hours compared to 05/20/2014  FINDINGS: Normal heart size, mediastinal contours and pulmonary vascularity for technique.  Minimal atelectasis at RIGHT base.  Lungs otherwise clear.  No pleural effusion or pneumothorax.  Osseous structures unremarkable.  IMPRESSION: Minimal RIGHT basilar atelectasis.   Electronically Signed   By: Lavonia Dana M.D.   On: 05/27/2014 11:39   Dg Chest Port 1 View  05/20/2014   CLINICAL DATA:  Altered mental status.  Unspecified duration.  EXAM: PORTABLE CHEST - 1 VIEW  COMPARISON:  01/29/2014.  FINDINGS: Normal heart size. Clear lung fields. Mild bronchitic change. No active infiltrates or failure. Visualized skeletal structures are unremarkable. Similar appearance to priors.  IMPRESSION: No active cardiopulmonary disease.   Electronically Signed   By: Rolla Flatten M.D.   On: 05/20/2014 13:54   Dg Abd 2 Views  05/29/2014   CLINICAL DATA:  Nausea and vomiting.  EXAM: ABDOMEN - 2 VIEW  COMPARISON:  None.  FINDINGS: The bowel gas pattern is normal. There is no evidence of free air. No definite radiopaque calculi identified lumbar spine degenerative changes are noted as well as vertebroplasty at site of old L3 vertebral body compression fracture.  IMPRESSION: Unremarkable bowel gas pattern.  No acute findings.   Electronically Signed   By: Earle Gell M.D.   On:  05/29/2014 19:25    CBC  Recent Labs Lab 05/27/14 1049 05/27/14 1108 05/28/14 0533  WBC 12.4*  --  7.4  HGB 12.8 13.3  13.3 10.4*  HCT 40.2 39.0  39.0 32.8*  PLT 242  --  172  MCV 86.3  --  87.0  MCH 27.5  --  27.6  MCHC 31.8  --  31.7  RDW 14.2  --  14.0  LYMPHSABS 1.3  --   --   MONOABS 0.8  --   --   EOSABS 0.0  --   --   BASOSABS 0.0  --   --     Chemistries   Recent Labs Lab  05/27/14 1209 05/28/14 0533 05/29/14 0424 05/30/14 0701 05/30/14 1400 05/31/14 0606  NA 146* 146* 142 141  --  138  K 4.9 4.2 3.1* 2.8* 4.1 3.8  CL 111 115* 112 108  --  109  CO2 24 25 22 26   --  24  GLUCOSE 198* 111* 79 67*  --  180*  BUN 37* 36* 27* 13  --  25*  CREATININE 1.30* 1.08 0.85 0.81  --  0.98  CALCIUM 9.4 9.2 8.6 8.9  --  8.6  AST 21  --   --   --   --   --   ALT 14  --   --   --   --   --   ALKPHOS 72  --   --   --   --   --   BILITOT 0.6  --   --   --   --   --    ------------------------------------------------------------------------------------------------------------------ estimated creatinine clearance is 54 mL/min (by C-G formula based on Cr of 0.98). ------------------------------------------------------------------------------------------------------------------  Recent Labs  05/29/14 0424  HGBA1C 8.1*   ------------------------------------------------------------------------------------------------------------------ No results for input(s): CHOL, HDL, LDLCALC, TRIG, CHOLHDL, LDLDIRECT in the last 72 hours. ------------------------------------------------------------------------------------------------------------------ No results for input(s): TSH, T4TOTAL, T3FREE, THYROIDAB in the last 72 hours.  Invalid input(s): FREET3 ------------------------------------------------------------------------------------------------------------------ No results for input(s): VITAMINB12, FOLATE, FERRITIN, TIBC, IRON, RETICCTPCT in the last 72 hours.  Coagulation profile No results for input(s): INR, PROTIME in the last 168 hours.  No results for input(s): DDIMER in the last 72 hours.  Cardiac Enzymes No results for input(s): CKMB, TROPONINI, MYOGLOBIN in the last 168 hours.  Invalid input(s): CK ------------------------------------------------------------------------------------------------------------------ Invalid input(s): [place]   05/29/14 2048 05/30/14 0724 05/30/14 0817 05/30/14 1253 05/30/14 1813 05/30/14 2310  GLUCAP 85 63* 104* 197* 193* 75*     Alicha Raspberry M.D. [facility] 05/31/2014, 9:44 AM  Pager: [phone]   Between 7am to 7pm - call Pager - [phone]  After 7pm go to [email or web address] - password [email or web address]  Call night coverage person covering after 7pm

## 2014-05-31 NOTE — Clinical Social Work Placement (Signed)
Clinical Social Work Department CLINICAL SOCIAL WORK PLACEMENT NOTE 05/31/2014  Patient:  [name], [name]  Account Number:  [id number] Admit date:  05/27/2014  Clinical Social Worker:  Dede Query, CLINICAL SOCIAL WORKER  Date/time:  05/31/2014 03:22 PM  Clinical Social Work is seeking post-discharge placement for this patient at the following level of care:   SKILLED NURSING   (*CSW will update this form in Epic as items are completed)   05/31/2014  Patient/family provided with [facility] Department of Clinical Social Work's list of facilities offering this level of care within the geographic area requested by the patient (or if unable, by the patient's family).  05/31/2014  Patient/family informed of their freedom to choose among providers that offer the needed level of care, that participate in Medicare, Medicaid or managed care program needed by the patient, have an available bed and are willing to accept the patient.  05/31/2014  Patient/family informed of [facility]' ownership interest in [facility], as well as of the fact that they are under no obligation to receive care at this facility.  PASARR submitted to EDS on 05/31/2014 PASARR number received on 05/31/2014  FL2 transmitted to all facilities in geographic area requested by pt/family on  05/31/2014 FL2 transmitted to all facilities within larger geographic area on   Patient informed that his/her managed care company has contracts with or will negotiate with  certain facilities, including the following:     Patient/family informed of bed offers received:  05/31/2014 Patient chooses bed at [facility] Physician recommends and patient chooses bed at    Patient to be transferred to [facility] on  05/31/2014 Patient to be transferred to facility by Ambulance Patient and family notified of transfer on 05/31/2014 Name of family member notified:   [place]/Daughter  The following physician request were entered in Epic:   Additional Comments:  .Dede Query, [place] Worker - Weekend Coverage cell #: [phone]

## 2014-05-31 NOTE — Progress Notes (Signed)
Patient was picked up by [facility] around 20:30 going to [facility].  Patient then returned to floor around 22:30.  Was informed that [facility] does not take care of patients with a foley cath.  They call to have patient brought back to the hospital.  Informed Dr. Alcario Drought of situation, [facility], Joe,  aware as well and also spoke to staff at [facility].  Dr. Alcario Drought saw patient when she arrived back to floor and spoke with her and her daughter at bedside.  Will continue prior orders for now.  Patient settled back in, vitals and CBG performed.  Patient given a sandwich as well as her previously scheduled 22:00 meds.  Will continue to monitor patient. Owens Shark, Fraya Ueda Cherie

## 2014-05-31 NOTE — Progress Notes (Signed)
Report called to [facility]. Condition stable. No changes in initial assessment. Report called to Harmon Pier LPN she is accepting the patient for the facility.  Will await arrival of EMS for transport

## 2014-06-02 DIAGNOSIS — E876 Hypokalemia: Secondary | ICD-10-CM | POA: Diagnosis not present

## 2014-06-02 DIAGNOSIS — K219 Gastro-esophageal reflux disease without esophagitis: Secondary | ICD-10-CM | POA: Diagnosis not present

## 2014-06-02 DIAGNOSIS — G9340 Encephalopathy, unspecified: Secondary | ICD-10-CM | POA: Diagnosis not present

## 2014-06-02 DIAGNOSIS — F419 Anxiety disorder, unspecified: Secondary | ICD-10-CM | POA: Diagnosis not present

## 2014-06-02 DIAGNOSIS — N179 Acute kidney failure, unspecified: Secondary | ICD-10-CM | POA: Diagnosis not present

## 2014-06-02 DIAGNOSIS — R339 Retention of urine, unspecified: Secondary | ICD-10-CM | POA: Diagnosis not present

## 2014-06-02 DIAGNOSIS — I10 Essential (primary) hypertension: Secondary | ICD-10-CM | POA: Diagnosis not present

## 2014-06-02 DIAGNOSIS — E162 Hypoglycemia, unspecified: Secondary | ICD-10-CM | POA: Diagnosis not present

## 2014-06-02 DIAGNOSIS — R112 Nausea with vomiting, unspecified: Secondary | ICD-10-CM | POA: Diagnosis not present

## 2014-06-02 DIAGNOSIS — R509 Fever, unspecified: Secondary | ICD-10-CM | POA: Diagnosis not present

## 2014-06-02 LAB — CULTURE, BLOOD (ROUTINE X 2)
CULTURE: NO GROWTH
Culture: NO GROWTH

## 2014-06-05 DIAGNOSIS — R339 Retention of urine, unspecified: Secondary | ICD-10-CM | POA: Diagnosis not present

## 2014-06-05 DIAGNOSIS — F0390 Unspecified dementia without behavioral disturbance: Secondary | ICD-10-CM | POA: Diagnosis not present

## 2014-06-05 DIAGNOSIS — K3184 Gastroparesis: Secondary | ICD-10-CM | POA: Diagnosis not present

## 2014-06-05 DIAGNOSIS — N390 Urinary tract infection, site not specified: Secondary | ICD-10-CM | POA: Diagnosis not present

## 2014-06-17 DIAGNOSIS — R4182 Altered mental status, unspecified: Secondary | ICD-10-CM | POA: Diagnosis not present

## 2014-06-17 DIAGNOSIS — N390 Urinary tract infection, site not specified: Secondary | ICD-10-CM | POA: Diagnosis not present

## 2014-06-17 DIAGNOSIS — R638 Other symptoms and signs concerning food and fluid intake: Secondary | ICD-10-CM | POA: Diagnosis not present

## 2014-06-17 DIAGNOSIS — B9562 Methicillin resistant Staphylococcus aureus infection as the cause of diseases classified elsewhere: Secondary | ICD-10-CM | POA: Diagnosis not present

## 2014-06-30 ENCOUNTER — Ambulatory Visit (HOSPITAL_COMMUNITY): Payer: Medicare Other

## 2014-06-30 ENCOUNTER — Encounter (HOSPITAL_COMMUNITY): Payer: Self-pay | Admitting: Emergency Medicine

## 2014-06-30 ENCOUNTER — Inpatient Hospital Stay (HOSPITAL_COMMUNITY)
Admission: EM | Admit: 2014-06-30 | Discharge: 2014-07-04 | DRG: 871 | Disposition: A | Discharge status: Skilled Nursing Facility | Payer: Medicare Other | Attending: Internal Medicine | Admitting: Internal Medicine

## 2014-06-30 ENCOUNTER — Emergency Department (HOSPITAL_COMMUNITY): Payer: Medicare Other

## 2014-06-30 DIAGNOSIS — R1311 Dysphagia, oral phase: Secondary | ICD-10-CM | POA: Diagnosis not present

## 2014-06-30 DIAGNOSIS — J189 Pneumonia, unspecified organism: Secondary | ICD-10-CM | POA: Diagnosis present

## 2014-06-30 DIAGNOSIS — E1129 Type 2 diabetes mellitus with other diabetic kidney complication: Secondary | ICD-10-CM | POA: Diagnosis present

## 2014-06-30 DIAGNOSIS — F418 Other specified anxiety disorders: Secondary | ICD-10-CM | POA: Diagnosis not present

## 2014-06-30 DIAGNOSIS — K3184 Gastroparesis: Secondary | ICD-10-CM | POA: Diagnosis present

## 2014-06-30 DIAGNOSIS — J9601 Acute respiratory failure with hypoxia: Secondary | ICD-10-CM | POA: Diagnosis not present

## 2014-06-30 DIAGNOSIS — J9811 Atelectasis: Secondary | ICD-10-CM

## 2014-06-30 DIAGNOSIS — F0391 Unspecified dementia with behavioral disturbance: Secondary | ICD-10-CM | POA: Diagnosis not present

## 2014-06-30 DIAGNOSIS — E1100 Type 2 diabetes mellitus with hyperosmolarity without nonketotic hyperglycemic-hyperosmolar coma (NKHHC): Secondary | ICD-10-CM | POA: Diagnosis present

## 2014-06-30 DIAGNOSIS — N390 Urinary tract infection, site not specified: Secondary | ICD-10-CM | POA: Diagnosis present

## 2014-06-30 DIAGNOSIS — Z882 Allergy status to sulfonamides status: Secondary | ICD-10-CM

## 2014-06-30 DIAGNOSIS — E870 Hyperosmolality and hypernatremia: Secondary | ICD-10-CM

## 2014-06-30 DIAGNOSIS — Z794 Long term (current) use of insulin: Secondary | ICD-10-CM

## 2014-06-30 DIAGNOSIS — A419 Sepsis, unspecified organism: Secondary | ICD-10-CM | POA: Diagnosis not present

## 2014-06-30 DIAGNOSIS — N170 Acute kidney failure with tubular necrosis: Secondary | ICD-10-CM | POA: Diagnosis present

## 2014-06-30 DIAGNOSIS — E1165 Type 2 diabetes mellitus with hyperglycemia: Secondary | ICD-10-CM | POA: Diagnosis not present

## 2014-06-30 DIAGNOSIS — E119 Type 2 diabetes mellitus without complications: Secondary | ICD-10-CM | POA: Diagnosis not present

## 2014-06-30 DIAGNOSIS — D649 Anemia, unspecified: Secondary | ICD-10-CM | POA: Diagnosis not present

## 2014-06-30 DIAGNOSIS — B3749 Other urogenital candidiasis: Secondary | ICD-10-CM | POA: Diagnosis present

## 2014-06-30 DIAGNOSIS — E2740 Unspecified adrenocortical insufficiency: Secondary | ICD-10-CM | POA: Diagnosis present

## 2014-06-30 DIAGNOSIS — G2581 Restless legs syndrome: Secondary | ICD-10-CM | POA: Diagnosis present

## 2014-06-30 DIAGNOSIS — E780 Pure hypercholesterolemia: Secondary | ICD-10-CM | POA: Diagnosis present

## 2014-06-30 DIAGNOSIS — F419 Anxiety disorder, unspecified: Secondary | ICD-10-CM | POA: Diagnosis present

## 2014-06-30 DIAGNOSIS — R1310 Dysphagia, unspecified: Secondary | ICD-10-CM | POA: Diagnosis present

## 2014-06-30 DIAGNOSIS — F329 Major depressive disorder, single episode, unspecified: Secondary | ICD-10-CM | POA: Diagnosis present

## 2014-06-30 DIAGNOSIS — L89152 Pressure ulcer of sacral region, stage 2: Secondary | ICD-10-CM | POA: Diagnosis present

## 2014-06-30 DIAGNOSIS — E1101 Type 2 diabetes mellitus with hyperosmolarity with coma: Secondary | ICD-10-CM

## 2014-06-30 DIAGNOSIS — R339 Retention of urine, unspecified: Secondary | ICD-10-CM | POA: Diagnosis not present

## 2014-06-30 DIAGNOSIS — Y95 Nosocomial condition: Secondary | ICD-10-CM | POA: Diagnosis present

## 2014-06-30 DIAGNOSIS — E872 Acidosis, unspecified: Secondary | ICD-10-CM

## 2014-06-30 DIAGNOSIS — E46 Unspecified protein-calorie malnutrition: Secondary | ICD-10-CM | POA: Diagnosis not present

## 2014-06-30 DIAGNOSIS — F0390 Unspecified dementia without behavioral disturbance: Secondary | ICD-10-CM | POA: Diagnosis present

## 2014-06-30 DIAGNOSIS — R6521 Severe sepsis with septic shock: Secondary | ICD-10-CM | POA: Diagnosis present

## 2014-06-30 DIAGNOSIS — I517 Cardiomegaly: Secondary | ICD-10-CM | POA: Diagnosis not present

## 2014-06-30 DIAGNOSIS — N179 Acute kidney failure, unspecified: Secondary | ICD-10-CM | POA: Diagnosis present

## 2014-06-30 DIAGNOSIS — E876 Hypokalemia: Secondary | ICD-10-CM | POA: Diagnosis present

## 2014-06-30 DIAGNOSIS — R509 Fever, unspecified: Secondary | ICD-10-CM | POA: Diagnosis not present

## 2014-06-30 DIAGNOSIS — R2689 Other abnormalities of gait and mobility: Secondary | ICD-10-CM | POA: Diagnosis not present

## 2014-06-30 DIAGNOSIS — B373 Candidiasis of vulva and vagina: Secondary | ICD-10-CM | POA: Diagnosis present

## 2014-06-30 DIAGNOSIS — E559 Vitamin D deficiency, unspecified: Secondary | ICD-10-CM | POA: Diagnosis not present

## 2014-06-30 DIAGNOSIS — Z7982 Long term (current) use of aspirin: Secondary | ICD-10-CM

## 2014-06-30 DIAGNOSIS — J9600 Acute respiratory failure, unspecified whether with hypoxia or hypercapnia: Secondary | ICD-10-CM | POA: Diagnosis not present

## 2014-06-30 DIAGNOSIS — Z885 Allergy status to narcotic agent status: Secondary | ICD-10-CM

## 2014-06-30 DIAGNOSIS — I10 Essential (primary) hypertension: Secondary | ICD-10-CM | POA: Diagnosis present

## 2014-06-30 DIAGNOSIS — J302 Other seasonal allergic rhinitis: Secondary | ICD-10-CM | POA: Diagnosis not present

## 2014-06-30 DIAGNOSIS — M1990 Unspecified osteoarthritis, unspecified site: Secondary | ICD-10-CM | POA: Diagnosis present

## 2014-06-30 DIAGNOSIS — Z90710 Acquired absence of both cervix and uterus: Secondary | ICD-10-CM | POA: Diagnosis not present

## 2014-06-30 DIAGNOSIS — G9340 Encephalopathy, unspecified: Secondary | ICD-10-CM | POA: Diagnosis present

## 2014-06-30 DIAGNOSIS — R278 Other lack of coordination: Secondary | ICD-10-CM | POA: Diagnosis not present

## 2014-06-30 DIAGNOSIS — R000 Tachycardia, unspecified: Secondary | ICD-10-CM | POA: Diagnosis not present

## 2014-06-30 DIAGNOSIS — R7989 Other specified abnormal findings of blood chemistry: Secondary | ICD-10-CM

## 2014-06-30 LAB — CBC WITH DIFFERENTIAL/PLATELET
BASOS PCT: 0 % (ref 0–1)
BASOS PCT: 0 % (ref 0–1)
Basophils Absolute: 0 10*3/uL (ref 0.0–0.1)
Basophils Absolute: 0 10*3/uL (ref 0.0–0.1)
EOS PCT: 0 % (ref 0–5)
Eosinophils Absolute: 0 10*3/uL (ref 0.0–0.7)
Eosinophils Absolute: 0 10*3/uL (ref 0.0–0.7)
Eosinophils Relative: 0 % (ref 0–5)
HCT: 44.6 % (ref 36.0–46.0)
HCT: 38.2 % (ref 36.0–46.0)
Hemoglobin: 13.6 g/dL (ref 12.0–15.0)
Hemoglobin: 11.8 g/dL — ABNORMAL LOW (ref 12.0–15.0)
LYMPHS ABS: 1.1 10*3/uL (ref 0.7–4.0)
LYMPHS PCT: 5 % — AB (ref 12–46)
Lymphocytes Relative: 8 % — ABNORMAL LOW (ref 12–46)
Lymphs Abs: 1.1 10*3/uL (ref 0.7–4.0)
MCH: 26.6 pg (ref 26.0–34.0)
MCH: 26.8 pg (ref 26.0–34.0)
MCHC: 30.5 g/dL (ref 30.0–36.0)
MCHC: 30.9 g/dL (ref 30.0–36.0)
MCV: 86.6 fL (ref 78.0–100.0)
MCV: 87.3 fL (ref 78.0–100.0)
MONO ABS: 0.6 10*3/uL (ref 0.1–1.0)
MONOS PCT: 2 % — AB (ref 3–12)
Monocytes Absolute: 0.4 10*3/uL (ref 0.1–1.0)
Monocytes Relative: 4 % (ref 3–12)
NEUTROS ABS: 20.5 10*3/uL — AB (ref 1.7–7.7)
NEUTROS PCT: 88 % — AB (ref 43–77)
NEUTROS PCT: 93 % — AB (ref 43–77)
Neutro Abs: 12.5 10*3/uL — ABNORMAL HIGH (ref 1.7–7.7)
PLATELETS: 184 10*3/uL (ref 150–400)
Platelets: 320 10*3/uL (ref 150–400)
RBC: 5.11 MIL/uL (ref 3.87–5.11)
RBC: 4.41 MIL/uL (ref 3.87–5.11)
RDW: 14.1 % (ref 11.5–15.5)
RDW: 14.3 % (ref 11.5–15.5)
WBC Morphology: INCREASED
WBC: 14.2 10*3/uL — ABNORMAL HIGH (ref 4.0–10.5)
WBC: 22 10*3/uL — AB (ref 4.0–10.5)

## 2014-06-30 LAB — GLUCOSE, CAPILLARY
GLUCOSE-CAPILLARY: 232 mg/dL — AB (ref 70–99)
GLUCOSE-CAPILLARY: 180 mg/dL — AB (ref 70–99)
GLUCOSE-CAPILLARY: 181 mg/dL — AB (ref 70–99)
Glucose-Capillary: 163 mg/dL — ABNORMAL HIGH (ref 70–99)
Glucose-Capillary: 130 mg/dL — ABNORMAL HIGH (ref 70–99)
Glucose-Capillary: 193 mg/dL — ABNORMAL HIGH (ref 70–99)
Glucose-Capillary: 236 mg/dL — ABNORMAL HIGH (ref 70–99)

## 2014-06-30 LAB — URINALYSIS, ROUTINE W REFLEX MICROSCOPIC
Bilirubin Urine: NEGATIVE
KETONES UR: NEGATIVE mg/dL
Nitrite: NEGATIVE
Protein, ur: 100 mg/dL — AB
Specific Gravity, Urine: 1.029 (ref 1.005–1.030)
UROBILINOGEN UA: 0.2 mg/dL (ref 0.0–1.0)
pH: 5 (ref 5.0–8.0)

## 2014-06-30 LAB — BLOOD GAS, ARTERIAL
Acid-base deficit: 4.6 mmol/L — ABNORMAL HIGH (ref 0.0–2.0)
Acid-base deficit: 5 mmol/L — ABNORMAL HIGH (ref 0.0–2.0)
BICARBONATE: 19.4 meq/L — AB (ref 20.0–24.0)
BICARBONATE: 19.6 meq/L — AB (ref 20.0–24.0)
DRAWN BY: 308601
Drawn by: 235321
FIO2: 0.5 %
FIO2: 1 %
LHR: 14 {breaths}/min
MECHVT: 500 mL
MECHVT: 500 mL
O2 SAT: 95.2 %
O2 Saturation: 98.8 %
PCO2 ART: 31.5 mmHg — AB (ref 35.0–45.0)
PCO2 ART: 36.4 mmHg (ref 35.0–45.0)
PEEP/CPAP: 5 cmH2O
PEEP: 5 cmH2O
PH ART: 7.348 — AB (ref 7.350–7.450)
PH ART: 7.397 (ref 7.350–7.450)
PO2 ART: 264 mmHg — AB (ref 80.0–100.0)
PO2 ART: 85.8 mmHg (ref 80.0–100.0)
Patient temperature: 37.4
Patient temperature: 94.4
RATE: 14 resp/min
TCO2: 17.7 mmol/L (ref 0–100)
TCO2: 18 mmol/L (ref 0–100)

## 2014-06-30 LAB — COMPREHENSIVE METABOLIC PANEL
ALK PHOS: 71 U/L (ref 38–126)
ALT: 13 U/L — ABNORMAL LOW (ref 14–54)
ALT: 9 U/L — ABNORMAL LOW (ref 14–54)
AST: 26 U/L (ref 15–41)
AST: 22 U/L (ref 15–41)
Albumin: 3.7 g/dL (ref 3.5–5.0)
Albumin: 3.2 g/dL — ABNORMAL LOW (ref 3.5–5.0)
Alkaline Phosphatase: 55 U/L (ref 38–126)
Anion gap: 13 (ref 5–15)
Anion gap: 13 (ref 5–15)
BILIRUBIN TOTAL: 0.8 mg/dL (ref 0.3–1.2)
BUN: 51 mg/dL — AB (ref 6–20)
BUN: 60 mg/dL — AB (ref 6–20)
CHLORIDE: 115 mmol/L — AB (ref 101–111)
CO2: 22 mmol/L (ref 22–32)
CO2: 19 mmol/L — ABNORMAL LOW (ref 22–32)
CREATININE: 1.98 mg/dL — AB (ref 0.44–1.00)
CREATININE: 2.42 mg/dL — AB (ref 0.44–1.00)
Calcium: 10.3 mg/dL (ref 8.9–10.3)
Calcium: 9.1 mg/dL (ref 8.9–10.3)
Chloride: 121 mmol/L — ABNORMAL HIGH (ref 101–111)
GFR calc Af Amer: 27 mL/min — ABNORMAL LOW (ref >60)
GFR calc non Af Amer: 18 mL/min — ABNORMAL LOW (ref >60)
GFR, EST AFRICAN AMERICAN: 21 mL/min — AB (ref >60)
GFR, EST NON AFRICAN AMERICAN: 24 mL/min — AB (ref >60)
GLUCOSE: 240 mg/dL — AB (ref 70–99)
GLUCOSE: 680 mg/dL — AB (ref 70–99)
POTASSIUM: 4.8 mmol/L (ref 3.5–5.1)
Potassium: 3.3 mmol/L — ABNORMAL LOW (ref 3.5–5.1)
Sodium: 150 mmol/L — ABNORMAL HIGH (ref 135–145)
Sodium: 153 mmol/L — ABNORMAL HIGH (ref 135–145)
Total Bilirubin: 0.4 mg/dL (ref 0.3–1.2)
Total Protein: 7.9 g/dL (ref 6.5–8.1)
Total Protein: 6.8 g/dL (ref 6.5–8.1)

## 2014-06-30 LAB — MAGNESIUM: Magnesium: 1.9 mg/dL (ref 1.7–2.4)

## 2014-06-30 LAB — I-STAT CG4 LACTIC ACID, ED
LACTIC ACID, VENOUS: 4.08 mmol/L — AB (ref 0.5–2.0)
Lactic Acid, Venous: 6.02 mmol/L (ref 0.5–2.0)

## 2014-06-30 LAB — TROPONIN I
Troponin I: 0.07 ng/mL — ABNORMAL HIGH (ref <0.031)
Troponin I: 0.05 ng/mL — ABNORMAL HIGH (ref <0.031)

## 2014-06-30 LAB — APTT: aPTT: 23 seconds — ABNORMAL LOW (ref 24–37)

## 2014-06-30 LAB — LACTIC ACID, PLASMA
LACTIC ACID, VENOUS: 1.4 mmol/L (ref 0.5–2.0)
Lactic Acid, Venous: 3.7 mmol/L (ref 0.5–2.0)

## 2014-06-30 LAB — CBG MONITORING, ED
Glucose-Capillary: 372 mg/dL — ABNORMAL HIGH (ref 70–99)
Glucose-Capillary: 358 mg/dL — ABNORMAL HIGH (ref 70–99)
Glucose-Capillary: 295 mg/dL — ABNORMAL HIGH (ref 70–99)

## 2014-06-30 LAB — LIPASE, BLOOD: Lipase: 36 U/L (ref 22–51)

## 2014-06-30 LAB — URINE MICROSCOPIC-ADD ON

## 2014-06-30 LAB — RAPID URINE DRUG SCREEN, HOSP PERFORMED
Amphetamines: NOT DETECTED
BARBITURATES: NOT DETECTED
Benzodiazepines: POSITIVE — AB
COCAINE: NOT DETECTED
Opiates: NOT DETECTED
Tetrahydrocannabinol: NOT DETECTED

## 2014-06-30 LAB — PROTIME-INR
INR: 1.32 (ref 0.00–1.49)
Prothrombin Time: 16.5 seconds — ABNORMAL HIGH (ref 11.6–15.2)

## 2014-06-30 LAB — STREP PNEUMONIAE URINARY ANTIGEN: Strep Pneumo Urinary Antigen: NEGATIVE

## 2014-06-30 LAB — CORTISOL: Cortisol, Plasma: 59.2 ug/dL

## 2014-06-30 LAB — AMYLASE: Amylase: 137 U/L — ABNORMAL HIGH (ref 28–100)

## 2014-06-30 LAB — MRSA PCR SCREENING: MRSA by PCR: POSITIVE — AB

## 2014-06-30 LAB — BRAIN NATRIURETIC PEPTIDE: B Natriuretic Peptide: 202.3 pg/mL — ABNORMAL HIGH (ref 0.0–100.0)

## 2014-06-30 LAB — PHOSPHORUS: Phosphorus: 2.1 mg/dL — ABNORMAL LOW (ref 2.5–4.6)

## 2014-06-30 MED ORDER — PANTOPRAZOLE SODIUM 40 MG IV SOLR: 40.0000 mg | Freq: Every day | INTRAVENOUS | Status: DC

## 2014-06-30 MED ORDER — POTASSIUM CHLORIDE 10 MEQ/100ML IV SOLN
10.0000 meq | INTRAVENOUS | Status: AC
  Administered 2014-06-30 (×4): 10 meq via INTRAVENOUS
  Filled 2014-06-30 (×4): qty 100

## 2014-06-30 MED ORDER — DEXTROSE 5 % IV SOLN
2.0000 g | Freq: Once | INTRAVENOUS | Status: AC
  Administered 2014-06-30: 2 g via INTRAVENOUS
  Filled 2014-06-30: qty 2

## 2014-06-30 MED ORDER — HYDROCORTISONE NA SUCCINATE PF 100 MG IJ SOLR
50.0000 mg | Freq: Four times a day (QID) | INTRAMUSCULAR | Status: DC
Start: 2014-06-30 — End: 2014-07-01
  Administered 2014-06-30 – 2014-07-01 (×5): 50 mg via INTRAVENOUS
  Filled 2014-06-30 (×5): qty 2

## 2014-06-30 MED ORDER — VANCOMYCIN HCL IN DEXTROSE 1-5 GM/200ML-% IV SOLN
1000.0000 mg | Freq: Once | INTRAVENOUS | Status: AC
  Administered 2014-06-30: 1000 mg via INTRAVENOUS
  Filled 2014-06-30: qty 200

## 2014-06-30 MED ORDER — SODIUM CHLORIDE 0.9 % IV BOLUS (SEPSIS)
1000.0000 mL | INTRAVENOUS | Status: AC
Start: 2014-06-30 — End: 2014-06-30
  Administered 2014-06-30: 1000 mL via INTRAVENOUS

## 2014-06-30 MED ORDER — FENTANYL CITRATE (PF) 100 MCG/2ML IJ SOLN
50.0000 ug | INTRAMUSCULAR | Status: DC | PRN
  Administered 2014-06-30 – 2014-07-01 (×2): 50 ug via INTRAVENOUS
  Filled 2014-06-30 (×2): qty 2

## 2014-06-30 MED ORDER — INSULIN ASPART 100 UNIT/ML ~~LOC~~ SOLN
10.0000 [IU] | Freq: Once | SUBCUTANEOUS | Status: AC
  Administered 2014-06-30: 10 [IU] via INTRAVENOUS
  Filled 2014-06-30: qty 1

## 2014-06-30 MED ORDER — SODIUM CHLORIDE 0.45 % IV SOLN
INTRAVENOUS | Status: DC
  Administered 2014-06-30: 125 mL via INTRAVENOUS

## 2014-06-30 MED ORDER — CHLORHEXIDINE GLUCONATE 0.12 % MT SOLN
15.0000 mL | Freq: Two times a day (BID) | OROMUCOSAL | Status: DC
  Administered 2014-06-30 – 2014-07-04 (×9): 15 mL via OROMUCOSAL
  Filled 2014-06-30 (×7): qty 15

## 2014-06-30 MED ORDER — HEPARIN SODIUM (PORCINE) 5000 UNIT/ML IJ SOLN
5000.0000 [IU] | Freq: Three times a day (TID) | INTRAMUSCULAR | Status: DC
  Administered 2014-06-30 – 2014-07-04 (×14): 5000 [IU] via SUBCUTANEOUS
  Filled 2014-06-30 (×14): qty 1

## 2014-06-30 MED ORDER — PANTOPRAZOLE SODIUM 40 MG PO PACK
40.0000 mg | PACK | ORAL | Status: DC
  Administered 2014-06-30: 40 mg
  Filled 2014-06-30 (×2): qty 20

## 2014-06-30 MED ORDER — LIDOCAINE HCL (CARDIAC) 20 MG/ML IV SOLN
INTRAVENOUS | Status: AC
  Filled 2014-06-30: qty 5

## 2014-06-30 MED ORDER — FENTANYL CITRATE (PF) 100 MCG/2ML IJ SOLN: 50.0000 ug | INTRAMUSCULAR | Status: DC | PRN

## 2014-06-30 MED ORDER — NOREPINEPHRINE BITARTRATE 1 MG/ML IV SOLN
0.0000 ug/min | INTRAVENOUS | Status: DC
  Filled 2014-06-30: qty 4

## 2014-06-30 MED ORDER — MUPIROCIN 2 % EX OINT
1.0000 "application " | TOPICAL_OINTMENT | Freq: Two times a day (BID) | CUTANEOUS | Status: DC
  Administered 2014-06-30 – 2014-07-04 (×9): 1 via NASAL
  Filled 2014-06-30 (×2): qty 22

## 2014-06-30 MED ORDER — DEXTROSE 5 % IV SOLN
INTRAVENOUS | Status: DC
  Administered 2014-06-30: 10:00:00 via INTRAVENOUS

## 2014-06-30 MED ORDER — CETYLPYRIDINIUM CHLORIDE 0.05 % MT LIQD
7.0000 mL | Freq: Four times a day (QID) | OROMUCOSAL | Status: DC
  Administered 2014-06-30 – 2014-07-04 (×17): 7 mL via OROMUCOSAL

## 2014-06-30 MED ORDER — ETOMIDATE 2 MG/ML IV SOLN
INTRAVENOUS | Status: AC
  Filled 2014-06-30: qty 20

## 2014-06-30 MED ORDER — SODIUM CHLORIDE 0.9 % IV BOLUS (SEPSIS)
500.0000 mL | Freq: Once | INTRAVENOUS | Status: AC
  Administered 2014-06-30: 500 mL via INTRAVENOUS

## 2014-06-30 MED ORDER — SODIUM CHLORIDE 0.9 % IV SOLN
INTRAVENOUS | Status: DC
  Administered 2014-06-30: 2.1 [IU]/h via INTRAVENOUS
  Administered 2014-06-30: 6.9 [IU]/h via INTRAVENOUS

## 2014-06-30 MED ORDER — SODIUM CHLORIDE 0.9 % IV SOLN
INTRAVENOUS | Status: DC
  Administered 2014-06-30: 3.1 [IU]/h via INTRAVENOUS
  Filled 2014-06-30: qty 2.5

## 2014-06-30 MED ORDER — LINEZOLID 600 MG/300ML IV SOLN
600.0000 mg | Freq: Two times a day (BID) | INTRAVENOUS | Status: DC
  Administered 2014-06-30 – 2014-07-01 (×4): 600 mg via INTRAVENOUS
  Filled 2014-06-30 (×5): qty 300

## 2014-06-30 MED ORDER — SODIUM CHLORIDE 0.9 % IV BOLUS (SEPSIS): 500.0000 mL | INTRAVENOUS | Status: DC

## 2014-06-30 MED ORDER — VANCOMYCIN HCL IN DEXTROSE 1-5 GM/200ML-% IV SOLN: 1000.0000 mg | INTRAVENOUS | Status: DC

## 2014-06-30 MED ORDER — FREE WATER
200.0000 mL | Freq: Three times a day (TID) | Status: DC
  Administered 2014-06-30 – 2014-07-01 (×3): 200 mL

## 2014-06-30 MED ORDER — SODIUM CHLORIDE 0.9 % IV SOLN: INTRAVENOUS | Status: DC

## 2014-06-30 MED ORDER — ACETAMINOPHEN 650 MG RE SUPP
650.0000 mg | Freq: Four times a day (QID) | RECTAL | Status: DC | PRN
Start: 2014-06-30 — End: 2014-06-30

## 2014-06-30 MED ORDER — DOBUTAMINE IN D5W 4-5 MG/ML-% IV SOLN: 2.5000 ug/kg/min | INTRAVENOUS | Status: DC

## 2014-06-30 MED ORDER — SODIUM CHLORIDE 0.9 % IV SOLN: 250.0000 mL | INTRAVENOUS | Status: DC | PRN

## 2014-06-30 MED ORDER — NOREPINEPHRINE BITARTRATE 1 MG/ML IV SOLN
0.0000 ug/min | Freq: Once | INTRAVENOUS | Status: AC
  Administered 2014-06-30: 5 ug/min via INTRAVENOUS
  Filled 2014-06-30: qty 4

## 2014-06-30 MED ORDER — SUCCINYLCHOLINE CHLORIDE 20 MG/ML IJ SOLN
INTRAMUSCULAR | Status: AC
  Filled 2014-06-30: qty 1

## 2014-06-30 MED ORDER — NOREPINEPHRINE BITARTRATE 1 MG/ML IV SOLN
5.0000 ug/min | INTRAVENOUS | Status: DC
  Administered 2014-06-30: 3 ug/min via INTRAVENOUS
  Filled 2014-06-30: qty 4

## 2014-06-30 MED ORDER — ROCURONIUM BROMIDE 50 MG/5ML IV SOLN
INTRAVENOUS | Status: AC
  Filled 2014-06-30: qty 2

## 2014-06-30 MED ORDER — ACETAMINOPHEN 650 MG RE SUPP
650.0000 mg | Freq: Once | RECTAL | Status: AC
  Administered 2014-06-30: 650 mg via RECTAL
  Filled 2014-06-30: qty 1

## 2014-06-30 MED ORDER — INSULIN ASPART 100 UNIT/ML ~~LOC~~ SOLN
0.0000 [IU] | SUBCUTANEOUS | Status: DC
  Administered 2014-06-30: 5 [IU] via SUBCUTANEOUS
  Administered 2014-06-30 (×2): 3 [IU] via SUBCUTANEOUS
  Administered 2014-07-01: 5 [IU] via SUBCUTANEOUS
  Administered 2014-07-01 (×2): 3 [IU] via SUBCUTANEOUS
  Administered 2014-07-01 (×2): 5 [IU] via SUBCUTANEOUS
  Administered 2014-07-01: 3 [IU] via SUBCUTANEOUS
  Administered 2014-07-01: 5 [IU] via SUBCUTANEOUS
  Administered 2014-07-02 (×2): 3 [IU] via SUBCUTANEOUS

## 2014-06-30 MED ORDER — INSULIN DETEMIR 100 UNIT/ML ~~LOC~~ SOLN
10.0000 [IU] | Freq: Every day | SUBCUTANEOUS | Status: DC
  Administered 2014-06-30 – 2014-07-01 (×2): 10 [IU] via SUBCUTANEOUS
  Filled 2014-06-30 (×6): qty 0.1

## 2014-06-30 MED ORDER — CHLORHEXIDINE GLUCONATE CLOTH 2 % EX PADS
6.0000 | MEDICATED_PAD | Freq: Every day | CUTANEOUS | Status: AC
  Administered 2014-06-30 – 2014-07-04 (×5): 6 via TOPICAL

## 2014-06-30 MED ORDER — DEXTROSE 5 % IV SOLN: 1.0000 g | INTRAVENOUS | Status: DC

## 2014-06-30 MED ORDER — ACETAMINOPHEN 160 MG/5ML PO SOLN: 650.0000 mg | Freq: Four times a day (QID) | ORAL | Status: DC | PRN

## 2014-06-30 MED ORDER — SODIUM CHLORIDE 0.9 % IV SOLN
Freq: Once | INTRAVENOUS | Status: AC
  Administered 2014-06-30: 05:00:00 via INTRAVENOUS

## 2014-06-30 MED ORDER — SODIUM CHLORIDE 0.9 % IV BOLUS (SEPSIS)
1000.0000 mL | Freq: Once | INTRAVENOUS | Status: AC
  Administered 2014-06-30: 1000 mL via INTRAVENOUS

## 2014-06-30 MED ORDER — SODIUM CHLORIDE 0.9 % IV BOLUS (SEPSIS)
1000.0000 mL | INTRAVENOUS | Status: AC
  Administered 2014-06-30 (×2): 1000 mL via INTRAVENOUS

## 2014-06-30 MED ORDER — PIPERACILLIN-TAZOBACTAM 3.375 G IVPB
3.3750 g | Freq: Three times a day (TID) | INTRAVENOUS | Status: DC
  Administered 2014-06-30 – 2014-07-02 (×7): 3.375 g via INTRAVENOUS
  Filled 2014-06-30 (×5): qty 50

## 2014-06-30 NOTE — Progress Notes (Addendum)
PULMONARY / CRITICAL CARE MEDICINE   Name: [name] MRN: [id number] DOB: Nov 15, 1940   PCP [name], MD  Urology: Dr Roni Bread  ADMISSION DATE:  06/30/2014 CONSULTATION DATE:  06/30/2014  REFERRING MD :  Dr Delora Fuel [facility]  CHIEF COMPLAINT:  Dementia patient with sepsis, circulatory shock, VDRF, HONK,  BRIEF DESCRIPTION: 74 yo female with dementia admitted with UTI, respiratory distress, and altered mental status.  She had Tm 105F, shock, VDRF, and hyperglycemia.   SIGNIFICANT EVENTS: 5/09 Admit, VDRF, septic shock  STUDIES:  SUBJECTIVE:  Remains on pressors, and insulin gtt.  VITAL SIGNS: Temp:  [94.4 F (34.7 C)-105.2 F (40.7 C)] 98.6 F (37 C) (05/09 0800) Pulse Rate:  [37-122] 122 (05/09 0800) Resp:  [12-42] 17 (05/09 0800) BP: (64-136)/(41-90) 116/66 mmHg (05/09 0800) SpO2:  [89 %-100 %] 99 % (05/09 0800) FiO2 (%):  [30 %-100 %] 30 % (05/09 0800) Weight:  [176 lb 9.4 oz (80.1 kg)] 176 lb 9.4 oz (80.1 kg) (05/09 0041) VENTILATOR SETTINGS: Vent Mode:  [-] PRVC FiO2 (%):  [30 %-100 %] 30 % Set Rate:  [14 bmp] 14 bmp Vt Set:  [500 mL] 500 mL PEEP:  [5 cmH20] 5 cmH20 Plateau Pressure:  [14 cmH20-16 cmH20] 15 cmH20 INTAKE / OUTPUT:  Intake/Output Summary (Last 24 hours) at 06/30/14 0855 Last data filed at 06/30/14 0819  Gross per 24 hour  Intake 3979.8 ml  Output    430 ml  Net 3549.8 ml    PHYSICAL EXAMINATION: General: ill appearing Neuro: RASS -3 HEENT: ETT in place Cardiovascular:  regular Lungs: no wheeze Abdomen: soft, non tender Musculoskeletal: Rt arm PICC site clean Skin: no edema  LABS:  PULMONARY  Recent Labs Lab 06/30/14 0046 06/30/14 0615  PHART 7.397 7.348*  PCO2ART 31.5* 36.4  PO2ART 264* 85.8  HCO3 19.6* 19.4*  TCO2 17.7 18.0  O2SAT 98.8 95.2    CBC  Recent Labs Lab 06/30/14 0023 06/30/14 0711  HGB 13.6 11.8*  HCT 44.6 38.2  WBC 22.0* 14.2*  PLT 320 184    COAGULATION  Recent Labs Lab  06/30/14 0711  INR 1.32    CARDIAC    Recent Labs Lab 06/30/14 0711  TROPONINI 0.07*   CHEMISTRY  Recent Labs Lab 06/30/14 0023 06/30/14 0711  NA 150* 153*  K 4.8 3.3*  CL 115* 121*  CO2 22 19*  GLUCOSE 680* 240*  BUN 60* 51*  CREATININE 2.42* 1.98*  CALCIUM 10.3 9.1  MG  --  1.9  PHOS  --  2.1*   Estimated Creatinine Clearance: 26.7 mL/min (by C-G formula based on Cr of 1.98).   LIVER  Recent Labs Lab 06/30/14 0023 06/30/14 0711  AST 26 22  ALT 13* 9*  ALKPHOS 71 55  BILITOT 0.8 0.4  PROT 7.9 6.8  ALBUMIN 3.7 3.2*  INR  --  1.32     INFECTIOUS  Recent Labs Lab 06/30/14 0028 06/30/14 0348 06/30/14 0711  LATICACIDVEN 6.02* 4.08* 3.7*     ENDOCRINE CBG (last 3)   Recent Labs  06/30/14 0229 06/30/14 0341 06/30/14 0434  GLUCAP 372* 358* 295*    IMAGING x48h Dg Chest Portable 1 View  06/30/2014   CLINICAL DATA:  Endotracheal tube placement. Difficulty breathing. Fever.  EXAM: PORTABLE CHEST - 1 VIEW  COMPARISON:  05/28/2014  FINDINGS: Endotracheal tube tip measures 2.6 cm above the carina. Right PICC line tip overlies the upper SVC region. No pneumothorax. Shallow inspiration. Heart size and pulmonary vascularity  are normal. No focal airspace disease or consolidation in the lungs. No blunting of costophrenic angles. Tortuous aorta.  IMPRESSION: Appliances appear to be in satisfactory location. No evidence of active pulmonary disease.   Electronically Signed   By: Lucienne Capers M.D.   On: 06/30/2014 00:46    ASSESSMENT / PLAN:  PULMONARY ETT 5/09 >> A: Acute hypoxic respiratory failure 2nd to sepsis, and possible HCAP. P:   Full vent support for now F/u CXR  CARDIOVASCULAR Chronic Rt PICC line >> A:  Septic shock. Hx of HTN. P:  Wean pressors to keep MAP > 65 ASA Hold cozaar, toprol, calan F/u Echo  RENAL A:  AKI. 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 Lactic acidosis >> improving. Hypokalemia. Hypernatremia. P:   Monitor  renal fx, urine outpt F/u lactic acid Replace electrolytes as needed Change IV fluid to D5W and add free water  GASTROINTESTINAL A:   Hx of gastroparesis. P:   Tube feeds while on vent Protonix for SUP  HEMATOLOGIC A:   Leukocytosis >> improving. P:  F/u CBC SQ heparin for DVT prevention  INFECTIOUS A:   Septic shock from possible HCAP, recurrent UTI. P:   Day 1 linezolid, zosyn  Blood 5/09 >> Urine 5/09 >> Sputum 5/09 >>   ENDOCRINE A:   Hyperosmolar non ketotic hyperglycemia.   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 P:   Transition off insulin gtt and change to SSI Wean off solu cortef once off pressors  NEUROLOGIC A:   Acute encephalopathy 2nd to sepsis, AKI, respiratory failure. Baseline dementia >> Daughter reports pt is minimal verbal response, unable to sit by herself, near total memory loss, and incontinent P:   RASS goal 0 Hold outpt xanax, remeron, zoloft, trazodone  Goals of care >> will need to discuss with family about appropriate goals of care given hx of advanced dementia.  CC time 45 minutes.  Chesley Mires, MD [place] 06/30/2014, 9:18 AM Pager:  [phone] After 3pm call: [phone]

## 2014-06-30 NOTE — Progress Notes (Addendum)
Chaplain paged at [phone] to support daughter of [name] who was at her bedside. Chaplain provided a calm and assuring presence while [name] was being treated for multiple health issues. She responds to the quiet and low volume singing of old hymns.  Daughter indicates that [name] was very ill when she visited her on Mother's Day, having a high temperature, foaming at the mouth, and neither eating or drinking. When daughter raised concern [name] was given tylenol for her fever. Daughter was called by [facility] nurse to say that [name] was not responsive and was being sent to the hospital.  Daughter has been at the bedside, talking to her mother and urging her to fight. As vital signs improved and medications began taking affect, daughter changed to telling her mother that she was safe in the hospital. Daughter's Significant Other (SO) arrived from the [place] area and has been with daughter to provide support.  Prayer and spiritual support provided. 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  Please page chaplain as pt or family needs or desires further spiritual care.  Sallee Lange. Katharina Jehle, [place]

## 2014-06-30 NOTE — ED Provider Notes (Signed)
CSN: [id number]     Arrival date & time 06/30/14  0003 History   First MD Initiated Contact with Patient 06/30/14 0026     Chief Complaint  Patient presents with  . Fever  . Respiratory Distress     (Consider location/radiation/quality/duration/timing/severity/associated sxs/prior Treatment) Patient is a 74 y.o. female presenting with fever. The history is provided by the nursing home and the EMS personnel. The history is limited by the condition of the patient (Altered mental status).  Fever She was transferred from nursing home with fever, difficulty breathing, and altered mentation. She has been treated for a MRSA urinary tract infection with intravenous vancomycin and was also started on ciprofloxacin yesterday. She was noted to have a fever of 102 and was given some acetaminophen and became less responsive. She became hypoxic with oxygen saturations of 80% which to come up to 96% on a nonrebreather. Patient is completely unresponsive in the emergency department and unable to give any history whatsoever.  Past Medical History  Diagnosis Date  . Anxiety   . Dyspnea   . Diabetes mellitus   . Osteoarthritis   . Hypercholesteremia   . Depression   . RLS (restless legs syndrome)   . Stress   . PONV (postoperative nausea and vomiting)   . Headache(784.0)   . HTN (hypertension)     dr Johnsie Cancel   Past Surgical History  Procedure Laterality Date  . Colonoscopy    . Abdominal hysterectomy    . Lumbar laminectomy/decompression microdiscectomy N/A 04/18/2012    Procedure: LUMBAR LAMINECTOMY/DECOMPRESSION MICRODISCECTOMY 3 LEVELS;  Surgeon: Winfield Cunas, MD;  Location: [facility];  Service: Neurosurgery;  Laterality: N/A;  Lumbar three-four, lumbar four-five, lumbar five-sacral one decompression. Synovial cyst resection lumbar four-five  . Joint replacement    . Left forearm fracture with orif Left   . Replacement total knee      right   Family History  Problem Relation Age of Onset   . Diabetes Father   . High blood pressure Father   . High blood pressure Mother    History  Substance Use Topics  . Smoking status: Never Smoker   . Smokeless tobacco: Never Used  . Alcohol Use: No   OB History    No data available     Review of Systems  Unable to perform ROS: Mental status change  Constitutional: Positive for fever.      Allergies  Morphine and related; Oxycodone; and Sulfonamide derivatives  Home Medications   Prior to Admission medications   Medication Sig Start Date End Date Taking? Authorizing Provider  acetaminophen (TYLENOL) 500 MG tablet Take 500 mg by mouth every 4 (four) hours as needed for mild pain, fever or headache (99.5 to 101).     Historical Provider, MD  aspirin 81 MG chewable tablet Chew 81 mg by mouth daily.    Historical Provider, MD  B Complex Vitamins (B-COMPLEX/B-12 PO) Take 1 tablet by mouth daily.     Historical Provider, MD  bethanechol (URECHOLINE) 25 MG tablet Take 1 tablet (25 mg total) by mouth 4 (four) times daily. X 2 days 05/31/14   Ripudeep Krystal Eaton, MD  bisacodyl (DULCOLAX) 10 MG suppository Place 10 mg rectally as needed for moderate constipation.    Historical Provider, MD  cholecalciferol (VITAMIN D) 1000 UNITS tablet Take 1,000 Units by mouth daily.    Historical Provider, MD  Cranberry 500 MG CAPS Take 1,000 mg by mouth daily at 12 noon.  Historical Provider, MD  feeding supplement, ENSURE ENLIVE, (ENSURE ENLIVE) LIQD Take 237 mLs by mouth 2 (two) times daily between meals. 05/30/14   Ripudeep Krystal Eaton, MD  insulin aspart (NOVOLOG) 100 UNIT/ML injection Inject 0-9 Units into the skin 3 (three) times daily with meals. Sliding scale CBG 70 - 120: 0 units CBG 121 - 150: 1 unit,  CBG 151 - 200: 2 units,  CBG 201 - 250: 3 units,  CBG 251 - 300: 5 units,  CBG 301 - 350: 7 units,  CBG 351 - 400: 9 units   CBG > 400: 9 units and notify your MD 05/30/14   Ripudeep Krystal Eaton, MD  insulin detemir (LEVEMIR) 100 UNIT/ML injection Inject 0.07 mLs  (7 Units total) into the skin at bedtime. 05/31/14   Ripudeep Krystal Eaton, MD  loperamide (IMODIUM) 2 MG capsule Take 2 mg by mouth as needed for diarrhea or loose stools. Do not exceed 8 doses in 24 hours    Historical Provider, MD  loratadine (CLARITIN) 10 MG tablet Take 10 mg by mouth daily as needed for allergies.     Historical Provider, MD  losartan (COZAAR) 100 MG tablet Take 100 mg by mouth daily.    Historical Provider, MD  Magnesium Oxide 500 MG TABS Take 1 tablet by mouth daily.    Historical Provider, MD  metoCLOPramide (REGLAN) 5 MG tablet Take 5 mg by mouth 2 (two) times daily before a meal.    Historical Provider, MD  metoprolol (TOPROL-XL) 100 MG 24 hr tablet Take 100 mg by mouth daily.      Historical Provider, MD  mirtazapine (REMERON SOL-TAB) 15 MG disintegrating tablet Take 0.5 tablets (7.5 mg total) by mouth at bedtime. 05/30/14   Ripudeep Krystal Eaton, MD  neomycin-bacitracin-polymyxin (NEOSPORIN) [phone] ointment Apply 1 application topically 4 (four) times daily as needed (skin tears, abrasions, minor irritations).    Historical Provider, MD  omeprazole (PRILOSEC) 40 MG capsule Take 40 mg by mouth daily.    Historical Provider, MD  ondansetron (ZOFRAN) 4 MG tablet Take 4 mg by mouth 3 (three) times daily.     Historical Provider, MD  polyethylene glycol (MIRALAX / GLYCOLAX) packet Take 17 g by mouth daily. 07/26/13   [name], MD  potassium chloride SA (K-DUR,KLOR-CON) 20 MEQ tablet Take 40 mEq by mouth daily.     Historical Provider, MD  Pramipexole Dihydrochloride 0.375 MG TB24 Take 1 tablet by mouth 3 (three) times daily.    Historical Provider, MD  senna-docusate (SENOKOT-S) 8.6-50 MG per tablet Take 1 tablet by mouth at bedtime as needed for mild constipation. 07/26/13   [name], MD  sertraline (ZOLOFT) 50 MG tablet Take 50 mg by mouth daily.    Historical Provider, MD  traZODone (DESYREL) 50 MG tablet Take 50 mg by mouth at bedtime as needed for sleep.    Historical  Provider, MD  UNABLE TO FIND Take 1 Can by mouth 4 (four) times daily. Mighty Shakes    Historical Provider, MD  verapamil (CALAN-SR) 240 MG CR tablet Take 1 tablet (240 mg total) by mouth daily. 07/26/13   [name], MD  zinc oxide (BALMEX) 11.3 % CREA cream Apply 1 application topically 2 (two) times daily. To rash 04/21/14   Ernestina Patches, MD   BP 122/83 mmHg  Pulse 111  Temp(Src) 104.6 F (40.3 C) (Rectal)  Resp 31  SpO2 96% Physical Exam  Nursing note and vitals reviewed.  74 year old female, unresponsive with  a rapid respiratory rate and audible rhonchi. Vital signs are significant for fever, tachypnea, tachycardia. Oxygen saturation is 96%, which is normal, but only maintained well she is receiving assisted ventilations with an Ambu bag. Head is normocephalic and atraumatic. PERRLA. Oropharynx is clear. Neck is supple without adenopathy or JVD. Lungs have coarse rhonchi throughout. Chest moves symmetrically. Heart is tachycardic without murmur. Abdomen is soft, flat, without masses or hepatosplenomegaly. Extremities have no cyanosis or edema, full range of motion is present. Skin is warm and dry with some mottling of the legs. There is a dressing on a shallow decubitus ulcer in the midline upper sacral area-this dressing is not removed. Neurologic: She is completely unresponsive to pain and voice and shows no spontaneous movement.  ED Course  Procedures (including critical care time) INTUBATION Performed by: [facility]  Required items: required blood products, implants, devices, and special equipment available Patient identity confirmed: provided demographic data and hospital-assigned identification number Time out: Immediately prior to procedure a "time out" was called to verify the correct patient, procedure, equipment, support staff and site/side marked as required.  Indications: Hypoxia   Intubation method: Lorne Skeens direct laryngoscopy    Preoxygenation:  BVM  Sedatives: None  Paralytic: None   Tube Size: 7.5 cuffed  Post-procedure assessment: chest rise and ETCO2 monitor Breath sounds: equal and absent over the epigastrium Tube secured with: ETT holder Chest x-ray interpreted by radiologist and me.  Chest x-ray findings: endotracheal tube in appropriate position  Patient tolerated the procedure well with no immediate complications.    Labs Review Results for orders placed or performed during the hospital encounter of 06/30/14  CBC with Differential  Result Value Ref Range   WBC 22.0 (H) 4.0 - 10.5 K/uL   RBC 5.11 3.87 - 5.11 MIL/uL   Hemoglobin 13.6 12.0 - 15.0 g/dL   HCT 44.6 36.0 - 46.0 %   MCV 87.3 78.0 - 100.0 fL   MCH 26.6 26.0 - 34.0 pg   MCHC 30.5 30.0 - 36.0 g/dL   RDW 14.1 11.5 - 15.5 %   Platelets 320 150 - 400 K/uL   Neutrophils Relative % 93 (H) 43 - 77 %   Lymphocytes Relative 5 (L) 12 - 46 %   Monocytes Relative 2 (L) 3 - 12 %   Eosinophils Relative 0 0 - 5 %   Basophils Relative 0 0 - 1 %   Neutro Abs 20.5 (H) 1.7 - 7.7 K/uL   Lymphs Abs 1.1 0.7 - 4.0 K/uL   Monocytes Absolute 0.4 0.1 - 1.0 K/uL   Eosinophils Absolute 0.0 0.0 - 0.7 K/uL   Basophils Absolute 0.0 0.0 - 0.1 K/uL   WBC Morphology INCREASED BANDS (>20% BANDS)   Comprehensive metabolic panel  Result Value Ref Range   Sodium 150 (H) 135 - 145 mmol/L   Potassium 4.8 3.5 - 5.1 mmol/L   Chloride 115 (H) 101 - 111 mmol/L   CO2 22 22 - 32 mmol/L   Glucose, Bld 680 (HH) 70 - 99 mg/dL   BUN 60 (H) 6 - 20 mg/dL   Creatinine, Ser 2.42 (H) 0.44 - 1.00 mg/dL   Calcium 10.3 8.9 - 10.3 mg/dL   Total Protein 7.9 6.5 - 8.1 g/dL   Albumin 3.7 3.5 - 5.0 g/dL   AST 26 15 - 41 U/L   ALT 13 (L) 14 - 54 U/L   Alkaline Phosphatase 71 38 - 126 U/L   Total Bilirubin 0.8 0.3 - 1.2 mg/dL  GFR calc non Af Amer 18 (L) >60 mL/min   GFR calc Af Amer 21 (L) >60 mL/min   Anion gap 13 5 - 15  Urinalysis, Routine w reflex microscopic  Result Value Ref Range    Color, Urine YELLOW YELLOW   APPearance TURBID (A) CLEAR   Specific Gravity, Urine 1.029 1.005 - 1.030   pH 5.0 5.0 - 8.0   Glucose, UA >1000 (A) NEGATIVE mg/dL   Hgb urine dipstick MODERATE (A) NEGATIVE   Bilirubin Urine NEGATIVE NEGATIVE   Ketones, ur NEGATIVE NEGATIVE mg/dL   Protein, ur 100 (A) NEGATIVE mg/dL   Urobilinogen, UA 0.2 0.0 - 1.0 mg/dL   Nitrite NEGATIVE NEGATIVE   Leukocytes, UA MODERATE (A) NEGATIVE  Urine rapid drug screen (hosp performed)  Result Value Ref Range   Opiates NONE DETECTED NONE DETECTED   Cocaine NONE DETECTED NONE DETECTED   Benzodiazepines POSITIVE (A) NONE DETECTED   Amphetamines NONE DETECTED NONE DETECTED   Tetrahydrocannabinol NONE DETECTED NONE DETECTED   Barbiturates NONE DETECTED NONE DETECTED  Blood gas, arterial  Result Value Ref Range   FIO2 1.00 %   Delivery systems VENTILATOR    Mode PRESSURE REGULATED VOLUME CONTROL    VT 500 mL   Rate 14 resp/min   Peep/cpap 5.0 cm H20   pH, Arterial 7.397 7.350 - 7.450   pCO2 arterial 31.5 (L) 35.0 - 45.0 mmHg   pO2, Arterial 264 (H) 80.0 - 100.0 mmHg   Bicarbonate 19.6 (L) 20.0 - 24.0 mEq/L   TCO2 17.7 0 - 100 mmol/L   Acid-base deficit 4.6 (H) 0.0 - 2.0 mmol/L   O2 Saturation 98.8 %   Patient temperature 94.4    Collection site RIGHT RADIAL    Drawn by [id number]    Sample type ARTERIAL DRAW    Allens test (pass/fail) PASS PASS  Urine microscopic-add on  Result Value Ref Range   Squamous Epithelial / LPF RARE RARE   WBC, UA TOO NUMEROUS TO COUNT <3 WBC/hpf   RBC / HPF TOO NUMEROUS TO COUNT <3 RBC/hpf   Bacteria, UA MANY (A) RARE   Casts GRANULAR CAST (A) NEGATIVE   Urine-Other MANY YEAST   I-Stat CG4 Lactic Acid, ED  Result Value Ref Range   Lactic Acid, Venous 6.02 (HH) 0.5 - 2.0 mmol/L   Comment NOTIFIED PHYSICIAN   I-Stat CG4 Lactic Acid, ED  Result Value Ref Range   Lactic Acid, Venous 4.08 (HH) 0.5 - 2.0 mmol/L   Comment NOTIFIED PHYSICIAN   CBG monitoring, ED  Result Value  Ref Range   Glucose-Capillary 372 (H) 70 - 99 mg/dL  CBG monitoring, ED  Result Value Ref Range   Glucose-Capillary 358 (H) 70 - 99 mg/dL  CBG monitoring, ED  Result Value Ref Range   Glucose-Capillary 295 (H) 70 - 99 mg/dL   Imaging Review Dg Chest Portable 1 View  06/30/2014   CLINICAL DATA:  Endotracheal tube placement. Difficulty breathing. Fever.  EXAM: PORTABLE CHEST - 1 VIEW  COMPARISON:  05/28/2014  FINDINGS: Endotracheal tube tip measures 2.6 cm above the carina. Right PICC line tip overlies the upper SVC region. No pneumothorax. Shallow inspiration. Heart size and pulmonary vascularity are normal. No focal airspace disease or consolidation in the lungs. No blunting of costophrenic angles. Tortuous aorta.  IMPRESSION: Appliances appear to be in satisfactory location. No evidence of active pulmonary disease.   Electronically Signed   By: Lucienne Capers M.D.   On: 06/30/2014 00:46   Image  viewed by me.   EKG Interpretation   Date/Time:  Monday Jun 30 2014 00:03:37 EDT Ventricular Rate:  117 PR Interval:  126 QRS Duration: 124 QT Interval:  406 QTC Calculation: 566 R Axis:   -53 Text Interpretation:  Sinus tachycardia Atrial premature complex  Nonspecific IVCD with LAD Repol abnrm, severe global ischemia (LM/MVD)  When compared with ECG of 05/27/2014, HEART RATE has increased Confirmed by  [facility]  MD, Franny Selvage ([id number]) on 06/30/2014 12:31:41 AM      CRITICAL CARE Performed by: MEQAS,TMHDQ Total critical care time: 260 minutes Critical care time was exclusive of separately billable procedures and treating other patients. Critical care was necessary to treat or prevent imminent or life-threatening deterioration. Critical care was time spent personally by me on the following activities: development of treatment plan with patient and/or surrogate as well as nursing, discussions with consultants, evaluation of patient's response to treatment, examination of patient, obtaining history  from patient or surrogate, ordering and performing treatments and interventions, ordering and review of laboratory studies, ordering and review of radiographic studies, pulse oximetry and re-evaluation of patient's condition.  MDM   Final diagnoses:  Sepsis, due to unspecified organism  Hyperosmolar nonketotic coma in diabetes  Acute kidney injury (nontraumatic)  Elevated lactic acid level  Hypernatremia  Acute respiratory failure with hypoxemia    Fever with sepsis. Without history support, oxygen saturations were dropping so was elected to intubate her. She is full code. Chest x-ray appears to show in infiltrate in the left lower lobe. She is started on antibiotics for healthcare associated pneumonia. Code sepsis was initiated. Patient's blood pressure did drop into the 70s at about the time the goal-directed fluid therapy was initiated. Family has arrived and has confirmed that the patient is full code. Old records are reviewed and she had been admitted to the hospital in March with encephalopathy related to a urinary tract infection.  Blood pressure actually dropped down into the 60s and was relatively slow to respond to IV fluids. IV norepinephrine drip was started to support blood pressure and the pressure eventually came up with full early goal-directed fluid therapy and pressors. Family members have arrived and were advised of severity of her condition and expressed understanding. Blood pressure stabilized and pressors are being weaned off. Case was discussed with Dr. Oletta Darter of critical care service who states that intensivist will not be able to come by immediately because of volume of critical care at [facility]. Patient is kept in the ED where fluids were continued. Laboratory workup shows evidence of thought diabetic hyperosmolar nonketotic coma with acute kidney injury. Creatinine has risen to 2.42 and BUN has risen to 60 with sodium of 150 and a 9 gap of 13. She is started on  glucose stabilizer. IV fluids should treat her acute kidney injury and hypernatremia. Urinalysis is also come backs showing evidence of infection with too numerous to count RBCs, too numerous to count WBCs, many bacteria but also many yeast. Initial lactic acid was markedly elevated at 6.02 and only came down to 4.1 with initial fluids so she was given another liter bolus. Blood sugar has come down to 295. With this treatment, patient has become slightly more responsive. She will squeeze fingers on command. Dr. Lynford Citizen of critical care service has arrived in the ED to take over her care.  Delora Fuel, MD 22/29/79 [id number]

## 2014-06-30 NOTE — Progress Notes (Signed)
ANTIBIOTIC CONSULT NOTE - INITIAL  Pharmacy Consult for Vancomycin and Cefepime Indication: HCAP  Allergies  Allergen Reactions  . Morphine And Related Other (See Comments)    hallucination   . Oxycodone Other (See Comments)    hallucination   . Sulfonamide Derivatives Swelling    Patient Measurements: Height: 5\' 7"  (170.2 cm) Weight: 176 lb 9.4 oz (80.1 kg) IBW/kg (Calculated) : 61.6   Vital Signs: Temp: 105.2 F (40.7 C) (05/09 0112) Temp Source: Core (Comment) (05/09 0112) BP: 79/43 mmHg (05/09 0112) Pulse Rate: 93 (05/09 0112) Intake/Output from previous day: 05/08 0701 - 05/09 0700 In: 1000 [I.V.:1000] Out: 55 [Urine:55] Intake/Output from this shift: Total I/O In: 1000 [I.V.:1000] Out: 55 [Urine:55]  Labs:  Recent Labs  06/30/14 0023  WBC 22.0*  HGB 13.6  PLT 320  CREATININE 2.42*   Estimated Creatinine Clearance: 21.9 mL/min (by C-G formula based on Cr of 2.42). No results for input(s): VANCOTROUGH, VANCOPEAK, VANCORANDOM, GENTTROUGH, GENTPEAK, GENTRANDOM, TOBRATROUGH, TOBRAPEAK, TOBRARND, AMIKACINPEAK, AMIKACINTROU, AMIKACIN in the last 72 hours.   Microbiology: No results found for this or any previous visit (from the past 720 hour(s)).  Medical History: Past Medical History  Diagnosis Date  . Anxiety   . Dyspnea   . Diabetes mellitus   . Osteoarthritis   . Hypercholesteremia   . Depression   . RLS (restless legs syndrome)   . Stress   . PONV (postoperative nausea and vomiting)   . Headache(784.0)   . HTN (hypertension)     dr Johnsie Cancel    Medications:   (Not in a hospital admission) Scheduled:  . etomidate      . insulin aspart  10 Units Intravenous Once  . lidocaine (cardiac) 100 mg/57ml      . rocuronium      . succinylcholine       Infusions:  . ceFEPime (MAXIPIME) IV    . insulin (NOVOLIN-R) infusion    . sodium chloride Stopped (06/30/14 0142)   Followed by  . sodium chloride    . vancomycin 1,000 mg (06/30/14 0135)  .  [START ON 07/01/2014] vancomycin     Assessment: 73 yoF c/o fever and respiratory difficulty.  Cefepime and Vancomycin per Rx for HCAP.  Goal of Therapy:  Vancomycin trough level 15-20 mcg/ml  Plan:   Cefepime 1Gm IV q24h  Vancomycin 1Gm IV q24h  F/u SCr/cultures/levels as needed  Lawana Pai R 06/30/2014,1:44 AM

## 2014-06-30 NOTE — ED Notes (Signed)
Give lab results to Dr. Roxanne Mins.

## 2014-06-30 NOTE — Progress Notes (Signed)
eLink Physician-Brief Progress Note Patient Name: [name] DOB: Apr 21, 1940 MRN: [id number]   Date of Service  06/30/2014  HPI/Events of Note  Hypokalemia, AKI improving  eICU Interventions  KCL 40 mEq IV     Intervention Category Major Interventions: Electrolyte abnormality - evaluation and management  MCQUAID, DOUGLAS 06/30/2014, 4:31 PM

## 2014-06-30 NOTE — ED Notes (Addendum)
Critical glucose of 680 called from lab. Dr. Roxanne Mins made aware.

## 2014-06-30 NOTE — Progress Notes (Signed)
Initial Nutrition Assessment  DOCUMENTATION CODES:  Not applicable  INTERVENTION: - Recommend Vital AF 1.2 @ 55 mL/hr and continue current order for 200 mL free water Q8h. At goal rate, this will provide 1584 kcal, 99 grams protein, and 1670 mL free water. - RD to continue to monitor for needs based on vent status  NUTRITION DIAGNOSIS:  Inadequate oral intake related to inability to eat as evidenced by NPO status.  GOAL:  Patient will meet greater than or equal to 90% of their needs  MONITOR:  Vent status, Weight trends, TF tolerance, I & O's  REASON FOR ASSESSMENT:  Consult  (Assessment and recommendations for TF)  ASSESSMENT: Pt with hx of dementia, dyspnea, DM, hypercholesterolemia, osteoarthritis, depression, RLS, HTN. She was admitted for sepsis, circulatory shock, VDRF.  No family in the room at time of visit. Pt intubated, not sedated, with OGT in place. Needs based on [facility] with Ve: 8.1 (1549 kcal). Pt unable to meet needs at this time. Per chart review, pt receiving Reglan PTA  But no order for it since admission. Physical assessment does not show muscle or fat wasting but does indicate mild edema to bilateral ankles.  Medications reviewed. Labs reviewed; CBGs: 163-372 mg/dL, Na: 153 mmol/L, K: 3.3 mmol/L, Cl: 121 mmol/L, BUN and creatinine elevated, GFR: 24. Recommend continue free water flush per MD order for dehydration  Height:  Ht Readings from Last 1 Encounters:  06/30/14 5\' 7"  (1.702 m)    Weight:  Wt Readings from Last 1 Encounters:  06/30/14 176 lb 9.4 oz (80.1 kg)    Ideal Body Weight:  61.4 kg  Wt Readings from Last 10 Encounters:  06/30/14 176 lb 9.4 oz (80.1 kg)  05/27/14 176 lb 9.4 oz (80.1 kg)  07/18/13 218 lb 0.6 oz (98.9 kg)  04/29/13 208 lb (94.348 kg)  12/28/12 215 lb (97.523 kg)  04/18/12 219 lb 9.3 oz (99.6 kg)  06/15/10 194 lb (87.998 kg)  06/03/10 180 lb (81.647 kg)  10/29/09 199 lb (90.266 kg)  04/30/09 196 lb  (88.905 kg)    BMI:  Body mass index is 27.65 kg/(m^2).  Estimated Nutritional Needs:  Kcal:  1450-1650  Protein:  95-115  Fluid:  >2L/day  Skin:  Wound (see comment)  Diet Order:  Diet NPO time specified  EDUCATION NEEDS:  No education needs identified at this time   Intake/Output Summary (Last 24 hours) at 06/30/14 1352 Last data filed at 06/30/14 1200  Gross per 24 hour  Intake 4569.6 ml  Output    625 ml  Net 3944.6 ml    Last BM:  PTA   Jarome Matin, RD, LDN Inpatient Clinical Dietitian Pager # [phone] After hours/weekend pager # [phone]

## 2014-06-30 NOTE — Progress Notes (Signed)
Date:  Jun 30, 2014 U.R. performed for needs and level of care. Will continue to follow for Case Management needs.  Batsheva Stevick, RN, BSN, CCM   [phone] 

## 2014-06-30 NOTE — Progress Notes (Signed)
ANTIBIOTIC CONSULT NOTE - INITIAL  Pharmacy Consult for Zyvox and Zosyn Indication: Lung, MRSA UTI  Allergies  Allergen Reactions  . Morphine And Related Other (See Comments)    hallucination   . Oxycodone Other (See Comments)    hallucination   . Sulfonamide Derivatives Swelling    Patient Measurements: Height: 5\' 7"  (170.2 cm) Weight: 176 lb 9.4 oz (80.1 kg) IBW/kg (Calculated) : 61.6   Vital Signs: Temp: 99.1 F (37.3 C) (05/09 0500) Temp Source: Core (Comment) (05/09 0221) BP: 117/62 mmHg (05/09 0500) Pulse Rate: 64 (05/09 0500) Intake/Output from previous day: 05/08 0701 - 05/09 0700 In: 3000 [I.V.:3000] Out: 55 [Urine:55] Intake/Output from this shift: Total I/O In: 3000 [I.V.:3000] Out: 55 [Urine:55]  Labs:  Recent Labs  06/30/14 0023  WBC 22.0*  HGB 13.6  PLT 320  CREATININE 2.42*   Estimated Creatinine Clearance: 21.9 mL/min (by C-G formula based on Cr of 2.42). No results for input(s): VANCOTROUGH, VANCOPEAK, VANCORANDOM, GENTTROUGH, GENTPEAK, GENTRANDOM, TOBRATROUGH, TOBRAPEAK, TOBRARND, AMIKACINPEAK, AMIKACINTROU, AMIKACIN in the last 72 hours.   Microbiology: No results found for this or any previous visit (from the past 720 hour(s)).  Medical History: Past Medical History  Diagnosis Date  . Anxiety   . Dyspnea   . Diabetes mellitus   . Osteoarthritis   . Hypercholesteremia   . Depression   . RLS (restless legs syndrome)   . Stress   . PONV (postoperative nausea and vomiting)   . Headache(784.0)   . HTN (hypertension)     dr Johnsie Cancel    Medications:  Prescriptions prior to admission  Medication Sig Dispense Refill Last Dose  . ALPRAZolam (XANAX) 0.5 MG tablet Take 0.5 mg by mouth 2 (two) times daily.   06/29/2014 at Unknown time  . aspirin 81 MG chewable tablet Chew 81 mg by mouth daily.   06/29/2014 at Unknown time  . B Complex Vitamins (B-COMPLEX/B-12 PO) Take 1 tablet by mouth daily.    06/29/2014 at Unknown time  . cholecalciferol  (VITAMIN D) 1000 UNITS tablet Take 1,000 Units by mouth daily.   06/29/2014 at Unknown time  . Cranberry-Vitamin C-Probiotic (AZO CRANBERRY) 250-30 MG TABS Take 1 tablet by mouth daily.   06/29/2014 at Unknown time  . losartan (COZAAR) 100 MG tablet Take 100 mg by mouth daily.   06/29/2014 at Unknown time  . Magnesium Oxide 500 MG TABS Take 500 mg by mouth daily.    06/29/2014 at Unknown time  . metoprolol (TOPROL-XL) 100 MG 24 hr tablet Take 100 mg by mouth daily.     06/29/2014 at 8am  . mirtazapine (REMERON) 7.5 MG tablet Take 7.5 mg by mouth at bedtime.   06/29/2014 at Unknown time  . omeprazole (PRILOSEC) 40 MG capsule Take 40 mg by mouth daily.   06/29/2014 at Unknown time  . ondansetron (ZOFRAN) 4 MG tablet Take 4 mg by mouth every 8 (eight) hours as needed for nausea or vomiting.    Past Month at Unknown time  . polyethylene glycol (MIRALAX / GLYCOLAX) packet Take 17 g by mouth daily. 14 each 0 06/29/2014 at Unknown time  . potassium chloride SA (K-DUR,KLOR-CON) 20 MEQ tablet Take 40 mEq by mouth daily.    06/29/2014 at Unknown time  . Pramipexole Dihydrochloride 0.375 MG TB24 Take 1 tablet by mouth 3 (three) times daily.   06/29/2014 at Unknown time  . sertraline (ZOLOFT) 50 MG tablet Take 50 mg by mouth daily.   06/29/2014 at Unknown time  .  UNABLE TO FIND Take 1 Can by mouth 4 (four) times daily. Mighty Shakes   06/29/2014 at Unknown time  . verapamil (CALAN-SR) 240 MG CR tablet Take 1 tablet (240 mg total) by mouth daily. 30 tablet 12 06/29/2014 at Unknown time  . acetaminophen (TYLENOL) 500 MG tablet Take 500 mg by mouth every 4 (four) hours as needed for mild pain, fever or headache (99.5 to 101).    unknown  . bethanechol (URECHOLINE) 25 MG tablet Take 1 tablet (25 mg total) by mouth 4 (four) times daily. X 2 days (Patient not taking: Reported on 06/30/2014) 10 tablet 0 Not Taking at Unknown time  . bisacodyl (DULCOLAX) 10 MG suppository Place 10 mg rectally as needed for moderate constipation.   unknown  .  feeding supplement, ENSURE ENLIVE, (ENSURE ENLIVE) LIQD Take 237 mLs by mouth 2 (two) times daily between meals. (Patient not taking: Reported on 06/30/2014) 237 mL 12 Not Taking at Unknown time  . insulin aspart (NOVOLOG) 100 UNIT/ML injection Inject 0-9 Units into the skin 3 (three) times daily with meals. Sliding scale CBG 70 - 120: 0 units CBG 121 - 150: 1 unit,  CBG 151 - 200: 2 units,  CBG 201 - 250: 3 units,  CBG 251 - 300: 5 units,  CBG 301 - 350: 7 units,  CBG 351 - 400: 9 units   CBG > 400: 9 units and notify your MD 10 mL 11 unknown  . insulin detemir (LEVEMIR) 100 UNIT/ML injection Inject 0.07 mLs (7 Units total) into the skin at bedtime. (Patient not taking: Reported on 06/30/2014) 10 mL 11 Not Taking at Unknown time  . loperamide (IMODIUM) 2 MG capsule Take 2 mg by mouth as needed for diarrhea or loose stools. Do not exceed 8 doses in 24 hours   unknown  . loratadine (CLARITIN) 10 MG tablet Take 10 mg by mouth daily as needed for allergies.    unknown  . mirtazapine (REMERON SOL-TAB) 15 MG disintegrating tablet Take 0.5 tablets (7.5 mg total) by mouth at bedtime. 30 tablet 1   . senna-docusate (SENOKOT-S) 8.6-50 MG per tablet Take 1 tablet by mouth at bedtime as needed for mild constipation. 30 tablet 12 unknown  . traZODone (DESYREL) 50 MG tablet Take 50 mg by mouth at bedtime as needed for sleep.   unknown  . zinc oxide (BALMEX) 11.3 % CREA cream Apply 1 application topically 2 (two) times daily. To rash (Patient not taking: Reported on 06/30/2014) 453 g 2 Not Taking at Unknown time   Scheduled:  . heparin  5,000 Units Subcutaneous 3 times per day  . hydrocortisone sod succinate (SOLU-CORTEF) inj  50 mg Intravenous Q6H  . linezolid  600 mg Intravenous Q12H  . pantoprazole (PROTONIX) IV  40 mg Intravenous QHS  . piperacillin-tazobactam (ZOSYN)  IV  3.375 g Intravenous Q8H   Infusions:  . sodium chloride    . sodium chloride    . DOBUTamine    . insulin (NOVOLIN-R) infusion 6.9 Units/hr  (06/30/14 0551)  . norepinephrine (LEVOPHED) Adult infusion 3 mcg/min (06/30/14 0545)   Assessment: 54 yoF c/o fever/respiratory difficulty.  Zyvox/Zosyn per Rx for lung, MRSA UTI.  5/9>>cefepime>>5/9 5/9>>vancomycin>>5/9 5/9>>zyvox>> 5/9>>zosyn>>  Goal of Therapy:  Treat infection  Plan:   Zyvox 600mg  IV q12h  Zosyn 3.375 Gm IV q8h EI  F/u SCr/cultures as needed  Lawana Pai R 06/30/2014,6:30 AM

## 2014-06-30 NOTE — ED Notes (Signed)
[facility] presents with 74 yo female from [place] home with fever and respiratory difficulty.  Pt had urinary infection and was given Cipro yesterday.  Pt was responsive when temp was taken at nursing home and was 102.0 F oral temp.  Pt was given unspecified amount of tylenol. Pt became unresponsive/painful stimuli only.  CBG HI; pulse rate 120 bpm sinus tach; initial SpO2 80%; 96% nonrebreather

## 2014-06-30 NOTE — ED Notes (Signed)
Report given to [facility] RN

## 2014-06-30 NOTE — H&P (Addendum)
PULMONARY / CRITICAL CARE MEDICINE   Name: [name] MRN: [id number] DOB: [birth year]   PCP [name], MD  Urology: Dr Roni Bread  ADMISSION DATE:  06/30/2014 CONSULTATION DATE:  06/30/2014  REFERRING MD :  Dr Delora Fuel Of ER  CHIEF COMPLAINT:  Dementia patient with sepsis, circulatory shock, VDRF, HONK,   SIGNIFICANT EVENTS: 06/30/2014 - admit   HISTORY OF PRESENT ILLNESS:  Hx from ER Nurse, chart review, Dr Roxanne Mins of ER and patient daughter  74 year old female with mild "stage 1 dementia" per daughter (but clear with advanced dementia when hx elicited from daughter). Admitted 05/27/14 to 05/31/14 with acute on chronic cognitive decline / acute encephalopathy related to UTI (culture negative?) and discharged on Rocephin and moved to [place] with indwelling foley (prior to that at [place] home x 18 months). Appears she has had persisent/recurrent MRSA UTI in SNF (MRSA PCR negative 05/30/14)  And started 7day vanc 06/22/14 ending 06/28/14. Also started on cipro po ? Indication 24h prior to admission. In immediate 24h prior to admission reports of increased pulmonary congestion and lethargy and transferred to [facility] ER. At [facility] ER temp 105F and obtunded and in circulatory shock needing emergent intubation (did not need sedation), pressors, fluid resus. Labs showed ATN with creat 2.4, HONK 600s sugar and lactic acidosis 6. PCCM admitting. Patient presumed full code. Of note, purulent ET tube secretions noted at intubation    PAST MEDICAL HISTORY :   has a past medical history of Anxiety; Dyspnea; Diabetes mellitus; Osteoarthritis; Hypercholesteremia; Depression; RLS (restless legs syndrome); Stress; PONV (postoperative nausea and vomiting); Headache(784.0); and HTN (hypertension).  has past surgical history that includes Colonoscopy; Abdominal hysterectomy; Lumbar laminectomy/decompression microdiscectomy (N/A, 04/18/2012); Joint replacement; left forearm fracture with ORIF (Left); and  Replacement total knee. Prior to Admission medications   Medication Sig Start Date End Date Taking? Authorizing Provider  ALPRAZolam Duanne Moron) 0.5 MG tablet Take 0.5 mg by mouth 2 (two) times daily.   Yes Historical Provider, MD  aspirin 81 MG chewable tablet Chew 81 mg by mouth daily.   Yes Historical Provider, MD  B Complex Vitamins (B-COMPLEX/B-12 PO) Take 1 tablet by mouth daily.    Yes Historical Provider, MD  cholecalciferol (VITAMIN D) 1000 UNITS tablet Take 1,000 Units by mouth daily.   Yes Historical Provider, MD  Cranberry-Vitamin C-Probiotic (AZO CRANBERRY) 250-30 MG TABS Take 1 tablet by mouth daily.   Yes Historical Provider, MD  losartan (COZAAR) 100 MG tablet Take 100 mg by mouth daily.   Yes Historical Provider, MD  Magnesium Oxide 500 MG TABS Take 500 mg by mouth daily.    Yes Historical Provider, MD  metoprolol (TOPROL-XL) 100 MG 24 hr tablet Take 100 mg by mouth daily.     Yes Historical Provider, MD  mirtazapine (REMERON) 7.5 MG tablet Take 7.5 mg by mouth at bedtime.   Yes Historical Provider, MD  omeprazole (PRILOSEC) 40 MG capsule Take 40 mg by mouth daily.   Yes Historical Provider, MD  ondansetron (ZOFRAN) 4 MG tablet Take 4 mg by mouth every 8 (eight) hours as needed for nausea or vomiting.    Yes Historical Provider, MD  polyethylene glycol (MIRALAX / GLYCOLAX) packet Take 17 g by mouth daily. 07/26/13  Yes [name], MD  potassium chloride SA (K-DUR,KLOR-CON) 20 MEQ tablet Take 40 mEq by mouth daily.    Yes Historical Provider, MD  Pramipexole Dihydrochloride 0.375 MG TB24 Take 1 tablet by mouth 3 (three) times daily.   Yes  Historical Provider, MD  sertraline (ZOLOFT) 50 MG tablet Take 50 mg by mouth daily.   Yes Historical Provider, MD  UNABLE TO FIND Take 1 Can by mouth 4 (four) times daily. Mighty Shakes   Yes Historical Provider, MD  verapamil (CALAN-SR) 240 MG CR tablet Take 1 tablet (240 mg total) by mouth daily. 07/26/13  Yes [name], MD  acetaminophen  (TYLENOL) 500 MG tablet Take 500 mg by mouth every 4 (four) hours as needed for mild pain, fever or headache (99.5 to 101).     Historical Provider, MD  bethanechol (URECHOLINE) 25 MG tablet Take 1 tablet (25 mg total) by mouth 4 (four) times daily. X 2 days Patient not taking: Reported on 06/30/2014 05/31/14   Ripudeep Krystal Eaton, MD  bisacodyl (DULCOLAX) 10 MG suppository Place 10 mg rectally as needed for moderate constipation.    Historical Provider, MD  feeding supplement, ENSURE ENLIVE, (ENSURE ENLIVE) LIQD Take 237 mLs by mouth 2 (two) times daily between meals. Patient not taking: Reported on 06/30/2014 05/30/14   Ripudeep Krystal Eaton, MD  insulin aspart (NOVOLOG) 100 UNIT/ML injection Inject 0-9 Units into the skin 3 (three) times daily with meals. Sliding scale CBG 70 - 120: 0 units CBG 121 - 150: 1 unit,  CBG 151 - 200: 2 units,  CBG 201 - 250: 3 units,  CBG 251 - 300: 5 units,  CBG 301 - 350: 7 units,  CBG 351 - 400: 9 units   CBG > 400: 9 units and notify your MD 05/30/14   Ripudeep Krystal Eaton, MD  insulin detemir (LEVEMIR) 100 UNIT/ML injection Inject 0.07 mLs (7 Units total) into the skin at bedtime. Patient not taking: Reported on 06/30/2014 05/31/14   Ripudeep Krystal Eaton, MD  loperamide (IMODIUM) 2 MG capsule Take 2 mg by mouth as needed for diarrhea or loose stools. Do not exceed 8 doses in 24 hours    Historical Provider, MD  loratadine (CLARITIN) 10 MG tablet Take 10 mg by mouth daily as needed for allergies.     Historical Provider, MD  mirtazapine (REMERON SOL-TAB) 15 MG disintegrating tablet Take 0.5 tablets (7.5 mg total) by mouth at bedtime. 05/30/14   Ripudeep Krystal Eaton, MD  senna-docusate (SENOKOT-S) 8.6-50 MG per tablet Take 1 tablet by mouth at bedtime as needed for mild constipation. 07/26/13   [name], MD  traZODone (DESYREL) 50 MG tablet Take 50 mg by mouth at bedtime as needed for sleep.    Historical Provider, MD  zinc oxide (BALMEX) 11.3 % CREA cream Apply 1 application topically 2 (two) times daily.  To rash Patient not taking: Reported on 06/30/2014 04/21/14   Ernestina Patches, MD   Allergies  Allergen Reactions  . Morphine And Related Other (See Comments)    hallucination   . Oxycodone Other (See Comments)    hallucination   . Sulfonamide Derivatives Swelling    FAMILY HISTORY:  indicated that her mother is deceased. She indicated that her father is deceased.  SOCIAL HISTORY:  reports that she has never smoked. She has never used smokeless tobacco. She reports that she does not drink alcohol or use illicit drugs.  REVIEW OF SYSTEMS:  unelicitable due to intubated status  SUBJECTIVE:   VITAL SIGNS: Temp:  [94.4 F (34.7 C)-105.2 F (40.7 C)] 99.3 F (37.4 C) (05/09 0415) Pulse Rate:  [74-115] 74 (05/09 0415) Resp:  [14-42] 17 (05/09 0415) BP: (64-131)/(41-90) 125/67 mmHg (05/09 0415) SpO2:  [89 %-100 %] 99 % (  05/09 0415) FiO2 (%):  [40 %-100 %] 40 % (05/09 0348) Weight:  [80.1 kg (176 lb 9.4 oz)] 80.1 kg (176 lb 9.4 oz) (05/09 0041) HEMODYNAMICS:   VENTILATOR SETTINGS: Vent Mode:  [-] PRVC FiO2 (%):  [40 %-100 %] 40 % Set Rate:  [14 bmp] 14 bmp Vt Set:  [500 mL] 500 mL PEEP:  [5 cmH20] 5 cmH20 Plateau Pressure:  [14 cmH20-16 cmH20] 14 cmH20 INTAKE / OUTPUT:  Intake/Output Summary (Last 24 hours) at 06/30/14 0456 Last data filed at 06/30/14 7001  Gross per 24 hour  Intake   3000 ml  Output     55 ml  Net   2945 ml    PHYSICAL EXAMINATION: General:  Intubated, looks critically ill in ER bed Neuro:  RASS -3 equivalent post intubation - no sedatiion. Improved per ER - occ. Squeezed hand HEENT:  intubated Cardiovascular:  Normal heart sounds Lungs:  In sync with vent. Clear to auscultation Abdomen:  Obese, soft, no mass. Normal bowel sounds Musculoskeletal:  No cyanosis, no clubbing, no edema Skin:  Intact anteriorly  LABS:  PULMONARY  Recent Labs Lab 06/30/14 0046  PHART 7.397  PCO2ART 31.5*  PO2ART 264*  HCO3 19.6*  TCO2 17.7  O2SAT 98.8     CBC  Recent Labs Lab 06/30/14 0023  HGB 13.6  HCT 44.6  WBC 22.0*  PLT 320    COAGULATION No results for input(s): INR in the last 168 hours.  CARDIAC  No results for input(s): TROPONINI in the last 168 hours. No results for input(s): PROBNP in the last 168 hours.   CHEMISTRY  Recent Labs Lab 06/30/14 0023  NA 150*  K 4.8  CL 115*  CO2 22  GLUCOSE 680*  BUN 60*  CREATININE 2.42*  CALCIUM 10.3   Estimated Creatinine Clearance: 21.9 mL/min (by C-G formula based on Cr of 2.42).   LIVER  Recent Labs Lab 06/30/14 0023  AST 26  ALT 13*  ALKPHOS 71  BILITOT 0.8  PROT 7.9  ALBUMIN 3.7     INFECTIOUS  Recent Labs Lab 06/30/14 0028 06/30/14 0348  LATICACIDVEN 6.02* 4.08*     ENDOCRINE CBG (last 3)   Recent Labs  06/30/14 0229 06/30/14 0341 06/30/14 0434  GLUCAP 372* 358* 295*         IMAGING x48h  - one more images have been personally visualized and highlighted in blue Dg Chest Portable 1 View  06/30/2014   CLINICAL DATA:  Endotracheal tube placement. Difficulty breathing. Fever.  EXAM: PORTABLE CHEST - 1 VIEW  COMPARISON:  05/28/2014  FINDINGS: Endotracheal tube tip measures 2.6 cm above the carina. Right PICC line tip overlies the upper SVC region. No pneumothorax. Shallow inspiration. Heart size and pulmonary vascularity are normal. No focal airspace disease or consolidation in the lungs. No blunting of costophrenic angles. Tortuous aorta.  IMPRESSION: Appliances appear to be in satisfactory location. No evidence of active pulmonary disease.   Electronically Signed   By: Lucienne Capers M.D.   On: 06/30/2014 00:46        ASSESSMENT / PLAN:  PULMONARY OETT - 06/30/2014 >> A:Acute resp failure - possible HCAP even if CXR clear, acute on chronic encephalopathy P:   Full vent support VAP bundle  CARDIOVASCULAR CVL - chronic PICC A: Circulatory shock P:  EGDT phase 2 CVP goal > 10 MAP goal > 65  RENAL  Acute Kidney  Injury (any one) Increase in SCr by > 0.3 within 48 hours Increase SCr  to > 1.5 times baseline Urine volume < 0.5 ml/kg/h for 6 hrs  Stage: Risk: 1.5x increase in creatinine or GFR decrease by 25% or UOP <0.88ml/kgperhr for 6 hrs Injury: 2x increase in creatinine or GFR decrease by 50% or UOP < 0.54ml/kgperhr for 12 hr Failure:3X increase in creatinine or GFR decrease by 75% or UOP < 0.11ml/kgperhr for 12 hr or  anuria 12 hrs Loss: complete loss of kidney function for more then 4 weeks End-stage renal disease:Complete loss of kidney function for more then 3 months  A:  #baseline  - chronic foley since early April 2016 #Current Acute KIDNEY INJURY    P:   Volume support Avoid nephrotoxin  GASTROINTESTINAL A:  HX of gastroparesis with eggs and salads but no aspiration per daughter P:   NPO Start TF  HEMATOLOGIC A:   Leukocytosis P:  monitor  INFECTIOUS Blood Urine Sputum  A:   #Admit early April 2016 for UTI NOS and discharged with chronic foley #SNF Rx with 7D IV vanc ending 06/28/14  #Currently  - UTI with septic shock  - Possible HCAP  P:   Linezolid due to recent IV vanc Zosyn (got cefepime in ER) PICC might need DC if MRSA in blood bears out Get 2D Echo rule out endocarditis   ENDOCRINE A:  HONK   P:   Fluid resus  NEUROLOGIC A:    #Baseline advanced dementia (duaghter describes as minimally verbal, unable to sit by self, near total memory loss and incontinent) #current  - obtunded due to sepsis/shick  P:   fent prn RASS goal 0   FAMILY  - Updates: daughter updated ER bedside at 06/30/2014   - Inter-disciplinary family meet or Palliative Care meeting due by: 07/07/14.    GLOBAL: Poor prognosis. Estimated survival with sepsis in setting of advanced dementia is <  6 months typically       The patient is critically ill with  multiple organ systems failure and requires high complexity decision making for assessment and support, frequent evaluation and titration of therapies, application of advanced monitoring technologies and extensive interpretation of multiple databases.   Critical Care Time devoted to patient care services described in this note is  45  Minutes. This time reflects time of care of this signee Dr Brand Males. This critical care time does not reflect procedure time, or teaching time or supervisory time of PA/NP/Med student/Med Resident etc but could involve care discussion time    Dr. Brand Males, M.D., [facility].C.P Pulmonary and Critical Care Medicine Staff Physician [place] [facility] Pager: [phone], If no answer or between  15:00h - 7:00h: call [phone]  06/30/2014 5:07 AM

## 2014-07-01 ENCOUNTER — Inpatient Hospital Stay (HOSPITAL_COMMUNITY): Payer: Medicare Other

## 2014-07-01 DIAGNOSIS — A419 Sepsis, unspecified organism: Secondary | ICD-10-CM

## 2014-07-01 DIAGNOSIS — I10 Essential (primary) hypertension: Secondary | ICD-10-CM

## 2014-07-01 LAB — CBC
HEMATOCRIT: 32.6 % — AB (ref 36.0–46.0)
Hemoglobin: 10.2 g/dL — ABNORMAL LOW (ref 12.0–15.0)
MCH: 26.7 pg (ref 26.0–34.0)
MCHC: 31.3 g/dL (ref 30.0–36.0)
MCV: 85.3 fL (ref 78.0–100.0)
Platelets: 150 10*3/uL (ref 150–400)
RBC: 3.82 MIL/uL — AB (ref 3.87–5.11)
RDW: 14.2 % (ref 11.5–15.5)
WBC: 14.7 10*3/uL — AB (ref 4.0–10.5)

## 2014-07-01 LAB — URINE CULTURE

## 2014-07-01 LAB — BASIC METABOLIC PANEL
ANION GAP: 11 (ref 5–15)
BUN: 52 mg/dL — AB (ref 6–20)
CHLORIDE: 116 mmol/L — AB (ref 101–111)
CO2: 20 mmol/L — AB (ref 22–32)
Calcium: 9.3 mg/dL (ref 8.9–10.3)
Creatinine, Ser: 1.84 mg/dL — ABNORMAL HIGH (ref 0.44–1.00)
GFR calc non Af Amer: 26 mL/min — ABNORMAL LOW (ref >60)
GFR, EST AFRICAN AMERICAN: 30 mL/min — AB (ref >60)
Glucose, Bld: 243 mg/dL — ABNORMAL HIGH (ref 70–99)
POTASSIUM: 3.6 mmol/L (ref 3.5–5.1)
Sodium: 147 mmol/L — ABNORMAL HIGH (ref 135–145)

## 2014-07-01 LAB — GLUCOSE, CAPILLARY
GLUCOSE-CAPILLARY: 225 mg/dL — AB (ref 70–99)
GLUCOSE-CAPILLARY: 153 mg/dL — AB (ref 70–99)
Glucose-Capillary: 219 mg/dL — ABNORMAL HIGH (ref 70–99)
Glucose-Capillary: 205 mg/dL — ABNORMAL HIGH (ref 70–99)
Glucose-Capillary: 175 mg/dL — ABNORMAL HIGH (ref 70–99)
Glucose-Capillary: 158 mg/dL — ABNORMAL HIGH (ref 70–99)

## 2014-07-01 LAB — TROPONIN I: TROPONIN I: 0.04 ng/mL — AB (ref <0.031)

## 2014-07-01 LAB — MAGNESIUM: Magnesium: 1.8 mg/dL (ref 1.7–2.4)

## 2014-07-01 LAB — LEGIONELLA ANTIGEN, URINE

## 2014-07-01 LAB — PHOSPHORUS: Phosphorus: 3 mg/dL (ref 2.5–4.6)

## 2014-07-01 MED ORDER — PANTOPRAZOLE SODIUM 40 MG IV SOLR
40.0000 mg | INTRAVENOUS | Status: DC
  Administered 2014-07-01: 40 mg via INTRAVENOUS
  Filled 2014-07-01 (×3): qty 40

## 2014-07-01 MED ORDER — HYDROCORTISONE NA SUCCINATE PF 100 MG IJ SOLR
50.0000 mg | Freq: Two times a day (BID) | INTRAMUSCULAR | Status: DC
  Administered 2014-07-01 – 2014-07-02 (×2): 50 mg via INTRAVENOUS
  Filled 2014-07-01 (×2): qty 2

## 2014-07-01 MED ORDER — SODIUM CHLORIDE 0.9 % IV BOLUS (SEPSIS)
1000.0000 mL | Freq: Once | INTRAVENOUS | Status: AC
  Administered 2014-07-01: 1000 mL via INTRAVENOUS

## 2014-07-01 MED ORDER — FLUCONAZOLE 100MG IVPB
100.0000 mg | Freq: Once | INTRAVENOUS | Status: AC
  Administered 2014-07-01: 100 mg via INTRAVENOUS
  Filled 2014-07-01: qty 50

## 2014-07-01 NOTE — Progress Notes (Signed)
Patient unable to follow instructions to use incentive spirometry.  Flutter valve ordered.

## 2014-07-01 NOTE — Procedures (Signed)
Extubation Procedure Note  Patient Details:   Name: [name] DOB: [birth year] MRN: [id number]   Airway Documentation:     Evaluation  O2 sats: stable throughout and currently acceptable Complications: No apparent complications Patient did tolerate procedure well. Bilateral Breath Sounds: Rhonchi Suctioning: Airway Yes   Prior to extubation:  Positive cuff leak noted.  Post-extubation:  Pt able to cough to produce sputum, speak with decent phonation, no stridor noted.  [name] 07/01/2014, 9:37 AM

## 2014-07-01 NOTE — Progress Notes (Signed)
  Echocardiogram 2D Echocardiogram has been performed.  [name] 07/01/2014, 12:30 PM

## 2014-07-01 NOTE — Progress Notes (Signed)
eLink Physician-Brief Progress Note Patient Name: [name] DOB: 12-23-1940 MRN: [id number]   Date of Service  07/01/2014  HPI/Events of Note  Oliguria. Urine output 240 mL for last 12 hours.   eICU Interventions  Will bolus with 0.9 NaCl IV over 1 hour now.      Intervention Category Intermediate Interventions: Oliguria - evaluation and management  Miho Monda Eugene 07/01/2014, 7:22 PM

## 2014-07-01 NOTE — Evaluation (Signed)
SLP Cancellation Note  Patient Details Name: [name] MRN: [id number] DOB: 1940-05-15   Cancelled treatment:       Reason Eval/Treat Not Completed:  (pt undergoing ECHO at this time, will reattempt evaluation)   Luanna Salk, [facility] [place] [facility] SLP [phone]

## 2014-07-01 NOTE — Plan of Care (Signed)
Problem: Phase II Progression Outcomes Goal: Date pt extubated/weaned off vent Outcome: Completed/Met Date Met:  07/01/14 07-01-14 Goal: Time pt extubated/weaned off vent Outcome: Completed/Met Date Met:  07/01/14 0925 am Goal: Progress activities as ordered Outcome: Completed/Met Date Met:  07/01/14 Bedside chair

## 2014-07-01 NOTE — Evaluation (Signed)
Clinical/Bedside Swallow Evaluation Patient Details  Name: [name] MRN: [id number] Date of Birth: 1940-11-07  Today's Date: 07/01/2014 Time: SLP Start Time (ACUTE ONLY): 1438 SLP Stop Time (ACUTE ONLY): 1508 SLP Time Calculation (min) (ACUTE ONLY): 30 min  Past Medical History:  Past Medical History  Diagnosis Date  . Anxiety   . Dyspnea   . Diabetes mellitus   . Osteoarthritis   . Hypercholesteremia   . Depression   . RLS (restless legs syndrome)   . Stress   . PONV (postoperative nausea and vomiting)   . Headache(784.0)   . HTN (hypertension)     dr Johnsie Cancel   Past Surgical History:  Past Surgical History  Procedure Laterality Date  . Colonoscopy    . Abdominal hysterectomy    . Lumbar laminectomy/decompression microdiscectomy N/A 04/18/2012    Procedure: LUMBAR LAMINECTOMY/DECOMPRESSION MICRODISCECTOMY 3 LEVELS;  Surgeon: Winfield Cunas, MD;  Location: [facility];  Service: Neurosurgery;  Laterality: N/A;  Lumbar three-four, lumbar four-five, lumbar five-sacral one decompression. Synovial cyst resection lumbar four-five  . Joint replacement    . Left forearm fracture with orif Left   . Replacement total knee      right   HPI:  74 yo female adm to [facility] with AMS, found to be septic, HONK, VDRF.  Pt required ventilator respiratory support 5/9-5/10.  Pt has dementia and resides at [facility].  CXR 06/30/2014 negative.  ? pt with pna on CXR 05/2014.    Assessment / Plan / Recommendation Clinical Impression  Pt presents with cognitive based dysphagia resulting in oral holding, impaired mastication and significantly delayed oral transiting.  Pt with much more pronounced delay with solids vs liquids.  Pt with oral holding up to 3 minutes with solids - requiring jello/ice chips and 2 minutes/verbal cues to clear.   No s/s of aspiration however pt is at risk due to her cognitive based dysphagia.    Recommend to initiate full liquid diet for maximum efficiency and safety.   Educated pt to clinical reasoning for aspiration precautions and current diet modifications.  SLP to follow for readiness to advance diet.     Aspiration Risk   Moderate and ongoing   Diet Recommendation Thin (full liquids)   Medication Administration: Crushed with puree Compensations: Slow rate;Small sips/bites (assure pt swallows before giving more)    Other  Recommendations Oral Care Recommendations: Oral care BID Other Recommendations: Have oral suction available   Follow Up Recommendations       Frequency and Duration    1 week   Pertinent Vitals/Pain Low grade fever      Swallow Study Prior Functional Status   on soft diet no eggs no salad at SNF, h/o patulous esophagus and esophageal dysmotility    General Date of Onset: 07/01/14 Other Pertinent Information: 74 yo female adm to [facility] with AMS, found to be septic, HONK, VDRF.  Pt required ventilator respiratory support 5/9-5/10.  Pt has dementia and resides at [facility].  CXR 06/30/2014 negative.  ? pt with pna on CXR 05/2014.  Type of Study: Bedside swallow evaluation Diet Prior to this Study: NPO Temperature Spikes Noted: Yes Respiratory Status: Supplemental O2 delivered via (comment) (4 liters, nasal cannula) History of Recent Intubation: Yes Length of Intubations (days): 1 days Date extubated: 07/01/14 Behavior/Cognition: Alert;Cooperative;Pleasant mood;Doesn't follow directions;Other (Comment);Requires cueing (delayed responses) Oral Cavity - Dentition: Adequate natural dentition/normal for age Self-Feeding Abilities: Needs assist;Total assist Patient Positioning: Upright in bed Baseline Vocal Quality: Normal  Volitional Cough: Cognitively unable to elicit Volitional Swallow: Unable to elicit    Oral/Motor/Sensory Function Overall Oral Motor/Sensory Function:  (gross weakness but no focal cn deficits)   Ice Chips Ice chips: Not tested   Thin Liquid Thin Liquid: Impaired Presentation: Cup;Spoon;Straw Oral  Phase Impairments: Reduced lingual movement/coordination Pharyngeal  Phase Impairments: Suspected delayed Swallow    Nectar Thick Nectar Thick Liquid: Not tested   Honey Thick Honey Thick Liquid: Not tested   Puree Puree: Impaired Presentation: Spoon Oral Phase Impairments: Reduced lingual movement/coordination;Impaired anterior to posterior transit Oral Phase Functional Implications: Prolonged oral transit;Oral residue Pharyngeal Phase Impairments: Suspected delayed Swallow   Solid   GO    Solid: Impaired Oral Phase Impairments: Reduced lingual movement/coordination;Impaired anterior to posterior transit;Impaired mastication Oral Phase Functional Implications: Oral residue;Oral holding (oral holding for longer than 3 minutes, requiring jello to aid oral transiting) Pharyngeal Phase Impairments: Suspected delayed Elonda Husky, [facility] SLP [phone]

## 2014-07-01 NOTE — Progress Notes (Signed)
PULMONARY / CRITICAL CARE MEDICINE   Name: [name] MRN: [id number] DOB: 06-17-1940   PCP [name], MD  Urology: Dr Roni Bread  ADMISSION DATE:  06/30/2014 CONSULTATION DATE:  06/30/2014  REFERRING MD :  Dr Delora Fuel Of ER  CHIEF COMPLAINT:  Dementia patient with sepsis, circulatory shock, VDRF, HONK,  BRIEF DESCRIPTION: 74 yo female with dementia admitted with UTI, respiratory distress, and altered mental status.  She had Tm 105F, shock, VDRF, and hyperglycemia.   SIGNIFICANT EVENTS: 5/09 Admit, VDRF, septic shock 5/10 off pressors, off insulin gtt  STUDIES:  SUBJECTIVE:  Off pressors, off insulin gtt.  Tolerating pressure support.  VITAL SIGNS: Temp:  [98.4 F (36.9 C)-100.2 F (37.9 C)] 98.4 F (36.9 C) (05/10 0800) Pulse Rate:  [58-74] 73 (05/09 2000) Resp:  [10-18] 18 (05/10 0800) BP: (78-154)/(52-86) 119/66 mmHg (05/10 0800) SpO2:  [97 %-100 %] 99 % (05/10 0800) FiO2 (%):  [30 %] 30 % (05/10 0715) Weight:  [176 lb 9.4 oz (80.1 kg)-177 lb 7.5 oz (80.5 kg)] 177 lb 7.5 oz (80.5 kg) (05/10 0512) VENTILATOR SETTINGS: Vent Mode:  [-] PSV;CPAP FiO2 (%):  [30 %] 30 % Set Rate:  [14 bmp] 14 bmp Vt Set:  [500 mL] 500 mL PEEP:  [5 cmH20] 5 cmH20 Pressure Support:  [5 cmH20] 5 cmH20 Plateau Pressure:  [14 cmH20-15 cmH20] 14 cmH20 INTAKE / OUTPUT:  Intake/Output Summary (Last 24 hours) at 07/01/14 0850 Last data filed at 07/01/14 0800  Gross per 24 hour  Intake 2751.8 ml  Output    730 ml  Net 2021.8 ml    PHYSICAL EXAMINATION: General: no distress Neuro: RASS 0, follow simples commands HEENT: ETT in place Cardiovascular:  regular Lungs: no wheeze Abdomen: soft, non tender Musculoskeletal: Rt arm PICC site clean Skin: no edema  LABS:  PULMONARY  Recent Labs Lab 06/30/14 0046 06/30/14 0615  PHART 7.397 7.348*  PCO2ART 31.5* 36.4  PO2ART 264* 85.8  HCO3 19.6* 19.4*  TCO2 17.7 18.0  O2SAT 98.8 95.2    CBC  Recent Labs Lab  06/30/14 0023 06/30/14 0711 07/01/14 0553  HGB 13.6 11.8* 10.2*  HCT 44.6 38.2 32.6*  WBC 22.0* 14.2* 14.7*  PLT 320 184 150    COAGULATION  Recent Labs Lab 06/30/14 0711  INR 1.32    CARDIAC    Recent Labs Lab 06/30/14 0711 06/30/14 1455 06/30/14 2100  TROPONINI 0.07* 0.05* 0.04*   CHEMISTRY  Recent Labs Lab 06/30/14 0023 06/30/14 0711 07/01/14 0553  NA 150* 153* 147*  K 4.8 3.3* 3.6  CL 115* 121* 116*  CO2 22 19* 20*  GLUCOSE 680* 240* 243*  BUN 60* 51* 52*  CREATININE 2.42* 1.98* 1.84*  CALCIUM 10.3 9.1 9.3  MG  --  1.9 1.8  PHOS  --  2.1* 3.0   LIVER  Recent Labs Lab 06/30/14 0023 06/30/14 0711  AST 26 22  ALT 13* 9*  ALKPHOS 71 55  BILITOT 0.8 0.4  PROT 7.9 6.8  ALBUMIN 3.7 3.2*  INR  --  1.32     INFECTIOUS  Recent Labs Lab 06/30/14 0348 06/30/14 0711 06/30/14 1455  LATICACIDVEN 4.08* 3.7* 1.4     ENDOCRINE CBG (last 3)   Recent Labs  07/01/14 0010 07/01/14 0417 07/01/14 0746  GLUCAP 219* 225* 205*    IMAGING x48h Dg Chest Portable 1 View  06/30/2014   CLINICAL DATA:  Endotracheal tube placement. Difficulty breathing. Fever.  EXAM: PORTABLE CHEST - 1 VIEW  COMPARISON:  05/28/2014  FINDINGS: Endotracheal tube tip measures 2.6 cm above the carina. Right PICC line tip overlies the upper SVC region. No pneumothorax. Shallow inspiration. Heart size and pulmonary vascularity are normal. No focal airspace disease or consolidation in the lungs. No blunting of costophrenic angles. Tortuous aorta.  IMPRESSION: Appliances appear to be in satisfactory location. No evidence of active pulmonary disease.   Electronically Signed   By: Lucienne Capers M.D.   On: 06/30/2014 00:46    ASSESSMENT / PLAN:  PULMONARY ETT 5/09 >> A: Acute hypoxic respiratory failure 2nd to sepsis, and possible HCAP. P:   Proceed with extubation PRN oxygen to keep SpO2 > 92% F/u CXR  CARDIOVASCULAR Chronic Rt PICC line >> A:  Septic shock >>  resolved. Hx of HTN. P:  ASA Hold cozaar, toprol, calan F/u Echo  RENAL A:  AKI >> improving. 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 Lactic acidosis >> resolved. Hypokalemia. Hypernatremia. P:   Monitor renal fx, urine outpt Replace electrolytes as needed D5W IV fluid >> change to [facility]  GASTROINTESTINAL A:   Hx of gastroparesis. Dysphagia. P:   Speech therapy to assess swallowing after extubation Protonix for SUP  HEMATOLOGIC A:   Leukocytosis >> improving. P:  F/u CBC SQ heparin for DVT prevention  INFECTIOUS A:   Septic shock from possible HCAP, recurrent UTI. P:   Day 2 linezolid, zosyn  Blood 5/09 >> Urine 5/09 >> Sputum 5/09 >>   ENDOCRINE A:   Hyperosmolar non ketotic hyperglycemia.   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 P:   SSI Wean off solu cortef   NEUROLOGIC A:   Acute encephalopathy 2nd to sepsis, AKI, respiratory failure. Baseline dementia >> Daughter reports pt is minimal verbal response, unable to sit by herself, near total memory loss, and incontinent P:   Hold outpt xanax, remeron, zoloft, trazodone for now >> need to be careful using SSRI's with linezolid with risk of seritonin syndrome  Goals of care >> will need to discuss with family about appropriate goals of care given hx of advanced dementia.  CC time 35 minutes.  Chesley Mires, MD [facility] Pulmonary/Critical Care 07/01/2014, 8:50 AM Pager:  [phone] After 3pm call: [phone]

## 2014-07-02 ENCOUNTER — Inpatient Hospital Stay (HOSPITAL_COMMUNITY): Payer: Medicare Other

## 2014-07-02 LAB — BASIC METABOLIC PANEL
ANION GAP: 5 (ref 5–15)
BUN: 48 mg/dL — AB (ref 6–20)
CO2: 23 mmol/L (ref 22–32)
CREATININE: 1.57 mg/dL — AB (ref 0.44–1.00)
Calcium: 8.9 mg/dL (ref 8.9–10.3)
Chloride: 117 mmol/L — ABNORMAL HIGH (ref 101–111)
GFR calc Af Amer: 36 mL/min — ABNORMAL LOW (ref >60)
GFR calc non Af Amer: 31 mL/min — ABNORMAL LOW (ref >60)
Glucose, Bld: 195 mg/dL — ABNORMAL HIGH (ref 70–99)
Potassium: 3.1 mmol/L — ABNORMAL LOW (ref 3.5–5.1)
Sodium: 145 mmol/L (ref 135–145)

## 2014-07-02 LAB — GLUCOSE, CAPILLARY
Glucose-Capillary: 185 mg/dL — ABNORMAL HIGH (ref 70–99)
Glucose-Capillary: 157 mg/dL — ABNORMAL HIGH (ref 70–99)
Glucose-Capillary: 237 mg/dL — ABNORMAL HIGH (ref 70–99)
Glucose-Capillary: 244 mg/dL — ABNORMAL HIGH (ref 70–99)

## 2014-07-02 LAB — CBC
HEMATOCRIT: 27.6 % — AB (ref 36.0–46.0)
Hemoglobin: 8.9 g/dL — ABNORMAL LOW (ref 12.0–15.0)
MCH: 27 pg (ref 26.0–34.0)
MCHC: 32.2 g/dL (ref 30.0–36.0)
MCV: 83.6 fL (ref 78.0–100.0)
Platelets: 127 10*3/uL — ABNORMAL LOW (ref 150–400)
RBC: 3.3 MIL/uL — ABNORMAL LOW (ref 3.87–5.11)
RDW: 13.8 % (ref 11.5–15.5)
WBC: 9.3 10*3/uL (ref 4.0–10.5)

## 2014-07-02 LAB — CLOSTRIDIUM DIFFICILE BY PCR: CDIFFPCR: NEGATIVE

## 2014-07-02 LAB — CULTURE, BLOOD (ROUTINE X 2)

## 2014-07-02 MED ORDER — INSULIN ASPART 100 UNIT/ML ~~LOC~~ SOLN
0.0000 [IU] | Freq: Every day | SUBCUTANEOUS | Status: DC
  Administered 2014-07-02: 2 [IU] via SUBCUTANEOUS

## 2014-07-02 MED ORDER — INSULIN DETEMIR 100 UNIT/ML ~~LOC~~ SOLN
10.0000 [IU] | Freq: Every day | SUBCUTANEOUS | Status: DC
  Administered 2014-07-02 – 2014-07-03 (×2): 10 [IU] via SUBCUTANEOUS
  Filled 2014-07-02 (×3): qty 0.1

## 2014-07-02 MED ORDER — PRAMIPEXOLE DIHYDROCHLORIDE 0.25 MG PO TABS
0.3750 mg | ORAL_TABLET | Freq: Three times a day (TID) | ORAL | Status: DC
  Administered 2014-07-02 – 2014-07-04 (×6): 0.375 mg via ORAL
  Filled 2014-07-02 (×8): qty 1

## 2014-07-02 MED ORDER — INSULIN ASPART 100 UNIT/ML ~~LOC~~ SOLN
4.0000 [IU] | Freq: Three times a day (TID) | SUBCUTANEOUS | Status: DC
  Administered 2014-07-02 – 2014-07-04 (×6): 4 [IU] via SUBCUTANEOUS

## 2014-07-02 MED ORDER — PANTOPRAZOLE SODIUM 40 MG PO TBEC
40.0000 mg | DELAYED_RELEASE_TABLET | Freq: Every day | ORAL | Status: DC
  Administered 2014-07-02 – 2014-07-04 (×3): 40 mg via ORAL
  Filled 2014-07-02 (×2): qty 1

## 2014-07-02 MED ORDER — LOSARTAN POTASSIUM 50 MG PO TABS
100.0000 mg | ORAL_TABLET | Freq: Every day | ORAL | Status: DC
  Administered 2014-07-02 – 2014-07-04 (×3): 100 mg via ORAL
  Filled 2014-07-02 (×3): qty 2

## 2014-07-02 MED ORDER — LINEZOLID 600 MG PO TABS
600.0000 mg | ORAL_TABLET | Freq: Two times a day (BID) | ORAL | Status: DC
  Administered 2014-07-02 – 2014-07-04 (×5): 600 mg via ORAL
  Filled 2014-07-02 (×7): qty 1

## 2014-07-02 MED ORDER — LOSARTAN POTASSIUM 50 MG PO TABS: 100.0000 mg | ORAL_TABLET | Freq: Every day | ORAL | Status: DC

## 2014-07-02 MED ORDER — INSULIN ASPART 100 UNIT/ML ~~LOC~~ SOLN
0.0000 [IU] | Freq: Three times a day (TID) | SUBCUTANEOUS | Status: DC
  Administered 2014-07-02 (×2): 5 [IU] via SUBCUTANEOUS
  Administered 2014-07-03: 3 [IU] via SUBCUTANEOUS

## 2014-07-02 MED ORDER — ENSURE ENLIVE PO LIQD
237.0000 mL | Freq: Two times a day (BID) | ORAL | Status: DC
  Administered 2014-07-02 – 2014-07-04 (×4): 237 mL via ORAL

## 2014-07-02 MED ORDER — POTASSIUM CHLORIDE CRYS ER 20 MEQ PO TBCR
40.0000 meq | EXTENDED_RELEASE_TABLET | Freq: Once | ORAL | Status: AC
  Administered 2014-07-02: 40 meq via ORAL
  Filled 2014-07-02: qty 2

## 2014-07-02 MED ORDER — ASPIRIN 81 MG PO CHEW
81.0000 mg | CHEWABLE_TABLET | Freq: Every day | ORAL | Status: DC
  Administered 2014-07-02 – 2014-07-04 (×3): 81 mg via ORAL
  Filled 2014-07-02 (×3): qty 1

## 2014-07-02 NOTE — Progress Notes (Signed)
PULMONARY / CRITICAL CARE MEDICINE   Name: [name] MRN: [id number] DOB: 10-22-1940   PCP [name], MD  Urology: Dr Roni Bread  ADMISSION DATE:  06/30/2014 CONSULTATION DATE:  06/30/2014  REFERRING MD :  Dr Delora Fuel Of ER  CHIEF COMPLAINT:  Dementia patient with sepsis, circulatory shock, VDRF, HONK,  BRIEF DESCRIPTION: 74 yo female with dementia admitted with UTI, respiratory distress, and altered mental status.  She had Tm 105F, shock, VDRF, and hyperglycemia.   SIGNIFICANT EVENTS: 5/09 Admit, VDRF, septic shock 5/10 off pressors, off insulin gtt 5/11 transfer to telemetry  STUDIES: 5/10 Echo >> mild LVH, EF 60 to 66%, grade 1 diastolic dysfx  SUBJECTIVE:  Episodes of sinus bradycardia overnight.  Denies chest pain, or dyspnea.  Tolerating diet better.  VITAL SIGNS: Temp:  [97.3 F (36.3 C)-98.6 F (37 C)] 97.7 F (36.5 C) (05/11 0800) Resp:  [0-28] 20 (05/11 0900) BP: (102-159)/(49-76) 159/70 mmHg (05/11 0800) SpO2:  [97 %-100 %] 97 % (05/11 0900) Weight:  [183 lb 10.3 oz (83.3 kg)] 183 lb 10.3 oz (83.3 kg) (05/11 0500) INTAKE / OUTPUT:  Intake/Output Summary (Last 24 hours) at 07/02/14 0929 Last data filed at 07/02/14 0900  Gross per 24 hour  Intake   2500 ml  Output    635 ml  Net   1865 ml    PHYSICAL EXAMINATION: General: no distress, sitting in chair Neuro: follows commands HEENT: no sinus tenderness Cardiovascular:  regular Lungs: no wheeze Abdomen: soft, non tender Musculoskeletal: Rt arm PICC site clean Skin: no edema  LABS:  PULMONARY  Recent Labs Lab 06/30/14 0046 06/30/14 0615  PHART 7.397 7.348*  PCO2ART 31.5* 36.4  PO2ART 264* 85.8  HCO3 19.6* 19.4*  TCO2 17.7 18.0  O2SAT 98.8 95.2    CBC  Recent Labs Lab 06/30/14 0711 07/01/14 0553 07/02/14 0500  HGB 11.8* 10.2* 8.9*  HCT 38.2 32.6* 27.6*  WBC 14.2* 14.7* 9.3  PLT 184 150 127*    COAGULATION  Recent Labs Lab 06/30/14 0711  INR 1.32    CARDIAC     Recent Labs Lab 06/30/14 0711 06/30/14 1455 06/30/14 2100  TROPONINI 0.07* 0.05* 0.04*   CHEMISTRY  Recent Labs Lab 06/30/14 0023 06/30/14 0711 07/01/14 0553 07/02/14 0500  NA 150* 153* 147* 145  K 4.8 3.3* 3.6 3.1*  CL 115* 121* 116* 117*  CO2 22 19* 20* 23  GLUCOSE 680* 240* 243* 195*  BUN 60* 51* 52* 48*  CREATININE 2.42* 1.98* 1.84* 1.57*  CALCIUM 10.3 9.1 9.3 8.9  MG  --  1.9 1.8  --   PHOS  --  2.1* 3.0  --    LIVER  Recent Labs Lab 06/30/14 0023 06/30/14 0711  AST 26 22  ALT 13* 9*  ALKPHOS 71 55  BILITOT 0.8 0.4  PROT 7.9 6.8  ALBUMIN 3.7 3.2*  INR  --  1.32    INFECTIOUS  Recent Labs Lab 06/30/14 0348 06/30/14 0711 06/30/14 1455  LATICACIDVEN 4.08* 3.7* 1.4    ENDOCRINE CBG (last 3)   Recent Labs  07/01/14 2002 07/02/14 0358 07/02/14 0732  GLUCAP 158* 185* 157*    IMAGING x48h Dg Chest Port 1 View  07/02/2014   CLINICAL DATA:  Atelectasis.  EXAM: PORTABLE CHEST - 1 VIEW  COMPARISON:  06/30/2014.  FINDINGS: Interim removal of endotracheal tube. Right PICC line in stable position. Mild fullness of the mediastinum noted, this is most likely related to tortuous thoracic aorta and prominent great vessels  as well as AP portable technique . Cardiomegaly with mild pulmonary vascular prominence interstitial prominence suggesting mild congestive heart failure. Bibasilar subsegmental atelectasis. Tiny bilateral pleural effusions cannot be excluded. No pneumothorax.  IMPRESSION: 1. Right PICC line in stable position. 2. Cardiomegaly with mild pulmonary vascular prominence and mild interstitial prominence suggesting mild congestive heart failure. Tiny bilateral pleural effusions cannot be excluded. 3. New onset of subsegmental atelectasis both lung bases.   Electronically Signed   By: Marcello Moores  Register   On: 07/02/2014 07:39    ASSESSMENT / PLAN:  PULMONARY ETT 5/09 >> A: Acute hypoxic respiratory failure 2nd to sepsis, and possible HCAP. P:    PRN oxygen to keep SpO2 > 92% F/u CXR intermittently  CARDIOVASCULAR Chronic Rt PICC line >> A:  Septic shock >> resolved. Hx of HTN. Sinus bradycardia. P:  ASA Resume cozaar 5/11 Hold toprol, calan for now  RENAL A:  AKI >> improving. 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 Lactic acidosis >> resolved. Hypokalemia. Hypernatremia >> improved. P:   Monitor renal fx, urine outpt Replace electrolytes as needed D/c IV fluid  GASTROINTESTINAL A:   Hx of gastroparesis. Dysphagia. P:   D3 diet (no tomato based foods, no salads, no eggs) Protonix for SUP  HEMATOLOGIC A:   Leukocytosis >> improving. Thrombocytopenia >> likely from sepsis. P:  F/u CBC SQ heparin for DVT prevention  INFECTIOUS A:   Septic shock from possible HCAP, recurrent UTI. Stage 2 sacral decubitus ulcer present prior to admission. Candidal vaginitis >> completed tx with diflucan 5/10. P:   Day 3 linezolid D/c zosyn 5/11  Blood 5/09 >> coag negative staph Urine 5/09 >> yeast Sputum 5/09 >> staph aureus >>   ENDOCRINE A:   Hyperosmolar non ketotic hyperglycemia.   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 P:   SSI D/c solu cortef 5/11  NEUROLOGIC A:   Acute encephalopathy 2nd to sepsis, AKI, respiratory failure. Baseline dementia >> Daughter reports pt is minimal verbal response, unable to sit by herself, near total memory loss, and incontinent. Deconditioning. P:   Hold outpt xanax, remeron, zoloft, trazodone for now >> need to be careful using SSRI's with linezolid with risk of seritonin syndrome PT  Summary: Transfer to telemetry.  To [facility] 5/12 and PCCM off.  Updated pt's daughter  Chesley Mires, MD [place] 07/02/2014, 9:29 AM Pager:  [phone] After 3pm call: [phone]

## 2014-07-02 NOTE — Consult Note (Signed)
WOC wound consult note Reason for Consult: sacral wound.   Pts daughter at bedside and reports/describes Stage II area in the gluteal cleft at the time of admission which was covered with hydrocolloid.  She now has two areas one on the left buttock and one on the right buttock based on presentation and location are most likely due to medical adhesive related skin injury.  The area in the gluteal cleft is wider per the report from the daughter  Wound type:Stage II Pressure ulcer with two areas one on the left and one on the right that are areas of MARSI Pressure Ulcer POA: Yes Measurement: 3.5cm x 2.0cm x 0.2cm (gluteal cleft pressure ulcer) area on the left buttock and right buttock each aprox 0.5c xm 0.5cm x 0.1cm  Wound bed: Pressure ulcer is pink, moist, partial thickness with some purple tissue scattered through the bed.  The other two areas are partial thickness, pink and moist.  Drainage (amount, consistency, odor) none Periwound:intact Dressing procedure/placement/frequency: Added air mattress, contacted 4 floor where this patient is to transfer to shortly and requested the bed be placed in the room prior to transfer. ICU nurse aware to verify placement prior to transfer.  Continue soft silicone foam, change every 3 days and PRN. Educated patient and daughter on differences in the skin issues (Pressure vs. Adhesive injury).  Need to turn and reposition frequently.  Will ask 4th floor staff to order pressure redistribution chair pad for when up in the chair.   Discussed POC with patient and bedside nurse.  Re consult if needed, will not follow at this time. Thanks  Hilari Wethington Kellogg, [place] [phone])

## 2014-07-02 NOTE — Progress Notes (Signed)
Speech Language Pathology Treatment: Dysphagia  Patient Details Name: [name] MRN: [id number] DOB: [birth year] Today's Date: 07/02/2014 Time: 7124-5809 SLP Time Calculation (min) (ACUTE ONLY): 40 min  Assessment / Plan / Recommendation Clinical Impression  Pt sitting upright eating breakfast upon SLP arrival to room.  She reports being fatigued today.  SLP observed pt consuming milk, juice grits, cracker and ice cream. Delayed oral transiting ongoing but with much improved timeliness of swallow today and improved awareness to oral residuals with adequate self clearing.    Intermittent coughing noted s/p swallow which pt reports is normal and has been present "for a long time".  Daughter [name] arrived during session and confirms pt statement.  Both daughter and pt agreeable to advance diet with strategies to mitigate aspiration.    Educated daughter/pt to importance of self feeding for maximal airway protection.  Per [name], pt has not been eating for approximately 1 week and when she did eat=she required someone to feed her.  Anticipate swallow ability to fully return to baseline with medical improvement.    SLP to follow up one more time to assure tolerance of dietary advancement and education completed.    HPI Other Pertinent Information: 74 yo female adm to [facility] with AMS, found to be septic, HONK, VDRF.  Pt required ventilator respiratory support 5/9-5/10.  Pt has dementia and resides at [facility].  CXR 06/30/2014 negative.  ? pt with pna on CXR 05/2014.    Pertinent Vitals Pain Assessment: No/denies pain  SLP Plan  Continue with current plan of care    Recommendations Diet recommendations: Dysphagia 3 (mechanical soft);Thin liquid Liquids provided via: Straw Medication Administration: Whole meds with puree Supervision: Patient able to self feed;Intermittent supervision to cue for compensatory strategies Compensations: Slow rate;Small sips/bites Postural Changes and/or  Swallow Maneuvers: Seated upright 90 degrees;Upright 30-60 min after meal              Oral Care Recommendations: Oral care BID Plan: Continue with current plan of care    [place], [place], [facility] SLP [phone]

## 2014-07-02 NOTE — Clinical Social Work Note (Signed)
Clinical Social Work Assessment  Patient Details  Name: [name] MRN: [id number] Date of Birth: [birth year]  Date of referral:  07/02/14               Reason for consult:  Discharge Planning                Permission sought to share information with:  Family Supports, Customer service manager Permission granted to share information::  Yes, Verbal Permission Granted  Name::     [name]/daughter  Agency::  [facility]  Relationship::  Daughter  Contact Information:  [phone]  Housing/Transportation Living arrangements for the past 2 months:  [facility] of Information:  Adult Children Patient Interpreter Needed:  None Criminal Activity/Legal Involvement Pertinent to Current Situation/Hospitalization:  No - Comment as needed Significant Relationships:  Adult Children Lives with:  Facility Resident Do you feel safe going back to the place where you live?  Yes Need for family participation in patient care:  Yes (Comment) (pt oriented to self only)  Care giving concerns:  Pt admitted from [facility].    Social Worker assessment / plan:  CSW received referral that pt admitted from [facility].   CSW visited pt room. Pt sleeping soundly at this time and pt daughter, Michelle Lull present at bedside. CSW introduced self and explained role. Pt daughter confirmed that pt is a resident at [facility].   CSW provided emotional support to pt daughter as pt daughter discussed the severity of pt illness in the past few days and expressed relief that pt is making slow improvements. Pt daughter reports that per MD, pt transferring to regular unit today.   Pt daughter confirmed plan for pt to return to [facility] upon discharge. Pt daughter shared that pt family has been pleased with the care at [facility] daughter stated that pt family did not place a bed hold and  recognizes that pt may not return to the same room at [facility].   CSW completed FL2 and sent clinical information to [facility]   Employment status:  Retired Forensic scientist:  Medicare PT Recommendations:  Not assessed at this time Information / Referral to community resources:  [place] (referral back to [place])  Patient/Family's Response to care:  Pt sleeping at this time. Pt oriented to self only. Pt daughter supportive and actively involved in pt care. Pt daughter expressed gratefulness to see slow improvement in pt.  Pt daughter agreeable to plan to return to [facility] upon discharge.   Patient/Family's Understanding of and Emotional Response to Diagnosis, Current Treatment, and Prognosis: Pt daughter is knowledgeable about pt current diagnosis and relieved to see slow progress as pt daughter reports that pt was "touch and go" for a while on the day of admission.   Emotional Assessment Appearance:  Appears stated age Attitude/Demeanor/Rapport:  Unable to Assess Affect (typically observed):  Unable to Assess (pt sleeping soundly at this time) Orientation:  Oriented to Self Alcohol / Substance use:  Not Applicable Psych involvement (Current and /or in the community):  No (Comment)  Discharge Needs  Concerns to be addressed:  Discharge Planning Concerns Readmission within the last 30 days:  Yes Current discharge risk:  Physical Impairment Barriers to Discharge:  Continued Medical Work up   Alison Murray A, LCSW 07/02/2014, 2:12 PM

## 2014-07-02 NOTE — Evaluation (Signed)
Physical Therapy Evaluation Patient Details Name: [name] MRN: [id number] DOB: Aug 08, 1940 Today's Date: 07/02/2014   History of Present Illness  Pt admitted from SNF with fever, difficulty breathing and AMS  Clinical Impression  Pt admitted with dx of sepsis and presents with functional mobility limitations 2* generalized weakness, balance deficits, limited endurance and dementia related cognitive deficits.  Pt would benefit from follow up rehab at SNF level.    Follow Up Recommendations SNF    Equipment Recommendations  None recommended by PT    Recommendations for Other Services       Precautions / Restrictions Precautions Precautions: Fall Restrictions Weight Bearing Restrictions: No      Mobility  Bed Mobility Overal bed mobility: Needs Assistance;+2 for physical assistance Bed Mobility: Sit to Supine       Sit to supine: Max assist;+2 for physical assistance;+2 for safety/equipment   General bed mobility comments: cues for sequence - assist required for trunk control and to bring LEs onto bed  Transfers Overall transfer level: Needs assistance Equipment used: Rolling walker (2 wheeled) Transfers: Sit to/from Stand Sit to Stand: Max assist;+2 physical assistance;+2 safety/equipment         General transfer comment: cues for LE management and use of UES to self assist.  Pt stood from chair and BSC x 6 and maintained standing for 1 - 2 minutes each time to allow clean up after bowel incontinence  Ambulation/Gait             General Gait Details: Pt stood only with RW and assist x 2 - posterior drift.  Pt unable to safely initiate step  - chair exchanged for [facility] and then for bed while pt stood  Stairs            Wheelchair Mobility    Modified Rankin (Stroke Patients Only)       Balance Overall balance assessment: Needs assistance Sitting-balance support: Bilateral upper extremity supported;Feet supported Sitting balance-Leahy  Scale: Fair     Standing balance support: Bilateral upper extremity supported Standing balance-Leahy Scale: Poor                               Pertinent Vitals/Pain Pain Assessment: Faces Faces Pain Scale: Hurts little more Pain Location: "my cheeks" referring to buttocks Pain Descriptors / Indicators: Sore Pain Intervention(s): Limited activity within patient's tolerance;Monitored during session    Home Living Family/patient expects to be discharged to:: Skilled nursing facility                 Additional Comments: has been living with daughter for 2 years. Has a reacher    Prior Function Level of Independence: Needs assistance   Gait / Transfers Assistance Needed: Per pt's dtr, pt nonambulatory in SNF but up until 4-6 weeks ago was ambulating using RW           Hand Dominance        Extremity/Trunk Assessment   Upper Extremity Assessment: Generalized weakness           Lower Extremity Assessment: Generalized weakness      Cervical / Trunk Assessment: Kyphotic  Communication   Communication: No difficulties;Other (comment)  Cognition Arousal/Alertness: Awake/alert Behavior During Therapy: Flat affect Overall Cognitive Status: History of cognitive impairments - at baseline                      General Comments  Exercises        Assessment/Plan    PT Assessment Patient needs continued PT services  PT Diagnosis Difficulty walking   PT Problem List Decreased strength;Decreased activity tolerance;Decreased balance;Decreased mobility;Decreased knowledge of use of DME;Decreased cognition;Decreased safety awareness;Obesity;Pain  PT Treatment Interventions DME instruction;Gait training;Functional mobility training;Therapeutic activities;Therapeutic exercise;Balance training;Patient/family education   PT Goals (Current goals can be found in the Care Plan section) Acute Rehab PT Goals Patient Stated Goal: Get back to bed PT  Goal Formulation: Patient unable to participate in goal setting Time For Goal Achievement: 07/16/14 Potential to Achieve Goals: Fair    Frequency Min 3X/week   Barriers to discharge        Co-evaluation               End of Session Equipment Utilized During Treatment: Gait belt Activity Tolerance: Patient limited by fatigue Patient left: in bed;with call bell/phone within reach;with family/visitor present Nurse Communication: Mobility status         Time: 6606-3016 PT Time Calculation (min) (ACUTE ONLY): 42 min   Charges:   PT Evaluation $Initial PT Evaluation Tier I: 1 Procedure PT Treatments $Therapeutic Activity: 23-37 mins   PT G Codes:        Alison Kubicki Jul 05, 2014, 12:58 PM

## 2014-07-02 NOTE — Significant Event (Signed)
Patient has been transferred safely to 4W24, traveled via bed. VS stable prior and during the transfer. No personal belongings at bedside of 2W that could be taken up to her new room. Patient transitioned to air mattress bed. Patient's daughter at bedside. Report given to receiving RN.

## 2014-07-02 NOTE — Progress Notes (Signed)
Nutrition Follow-up  DOCUMENTATION CODES:  Not applicable  INTERVENTION: - Ensure Enlive po BID, each supplement provides 350 kcal and 20 grams of protein - Magic cup TID with meals, each supplement provides 290 kcal and 9 grams of protein - RD will continue to monitor.   NUTRITION DIAGNOSIS:  Inadequate oral intake related to dysphagia as evidenced by meal completion < 50%.  ongoing  GOAL:  Patient will meet greater than or equal to 90% of their needs  Not met  MONITOR:  PO intake, Supplement acceptance, Labs, Weight trends  REASON FOR ASSESSMENT:  Consult  (Assessment and recommendations for TF)  ASSESSMENT: 74 yo female with dementia admitted with UTI, respiratory distress, and altered mental status. She had Tm 105F, shock, VDRF, and hyperglycemia.  - Pt extubated 5/10. - Seen by SLP who recommend diet upgrade from full liquids to Dysphagia III with no eggs, no salads or tomato-based foods.  - This morning pt ate ~30% of her full liquid breakfast. She had grits, ice cream (ate 100%), jello, and applesauce. - Spoke with pt's daughter who said that pt's appetite was improving. She reported pt's usual body weight as ~200 lbs. She did not know when she last weighed this. Wt loss of 17 lbs.  - RD to order nutritional supplements and monitor for po intake.   Labs and medications reviewed. CBGs 157-237 K 3.1 BUN 48  Height:  Ht Readings from Last 1 Encounters:  06/30/14 5' 7" (1.702 m)    Weight:  Wt Readings from Last 1 Encounters:  07/02/14 183 lb 10.3 oz (83.3 kg)    Ideal Body Weight:  61.4 kg  Wt Readings from Last 10 Encounters:  07/02/14 183 lb 10.3 oz (83.3 kg)  05/27/14 176 lb 9.4 oz (80.1 kg)  07/18/13 218 lb 0.6 oz (98.9 kg)  04/29/13 208 lb (94.348 kg)  12/28/12 215 lb (97.523 kg)  04/18/12 219 lb 9.3 oz (99.6 kg)  06/15/10 194 lb (87.998 kg)  06/03/10 180 lb (81.647 kg)  10/29/09 199 lb (90.266 kg)  04/30/09 196 lb (88.905 kg)    BMI:   Body mass index is 28.76 kg/(m^2).  Estimated Nutritional Needs:  Kcal:  1800-2000  Protein:  110-120 g  Fluid:  2.0 L/day  Skin:  Wound (see comment)  Diet Order:  DIET DYS 3 Room service appropriate?: Yes; Fluid consistency:: Thin  EDUCATION NEEDS:  Education needs addressed   Intake/Output Summary (Last 24 hours) at 07/02/14 1329 Last data filed at 07/02/14 1230  Gross per 24 hour  Intake 2462.5 ml  Output    855 ml  Net 1607.5 ml    Last BM:  5/11  Laurette Schimke [place], [place], [place]

## 2014-07-02 NOTE — Progress Notes (Signed)
Foley catheter taken out at 6pm.  Pt due to void.  Night RN made aware.

## 2014-07-03 DIAGNOSIS — G9340 Encephalopathy, unspecified: Secondary | ICD-10-CM

## 2014-07-03 DIAGNOSIS — R6521 Severe sepsis with septic shock: Secondary | ICD-10-CM

## 2014-07-03 DIAGNOSIS — J9601 Acute respiratory failure with hypoxia: Secondary | ICD-10-CM

## 2014-07-03 DIAGNOSIS — A419 Sepsis, unspecified organism: Principal | ICD-10-CM

## 2014-07-03 DIAGNOSIS — N179 Acute kidney failure, unspecified: Secondary | ICD-10-CM

## 2014-07-03 LAB — CBC
HEMATOCRIT: 30.1 % — AB (ref 36.0–46.0)
Hemoglobin: 9.7 g/dL — ABNORMAL LOW (ref 12.0–15.0)
MCH: 26.4 pg (ref 26.0–34.0)
MCHC: 32.2 g/dL (ref 30.0–36.0)
MCV: 81.8 fL (ref 78.0–100.0)
PLATELETS: 135 10*3/uL — AB (ref 150–400)
RBC: 3.68 MIL/uL — AB (ref 3.87–5.11)
RDW: 13.7 % (ref 11.5–15.5)
WBC: 8.3 10*3/uL (ref 4.0–10.5)

## 2014-07-03 LAB — CULTURE, RESPIRATORY W GRAM STAIN

## 2014-07-03 LAB — BASIC METABOLIC PANEL
Anion gap: 11 (ref 5–15)
BUN: 35 mg/dL — AB (ref 6–20)
CO2: 21 mmol/L — ABNORMAL LOW (ref 22–32)
CREATININE: 1.19 mg/dL — AB (ref 0.44–1.00)
Calcium: 9 mg/dL (ref 8.9–10.3)
Chloride: 114 mmol/L — ABNORMAL HIGH (ref 101–111)
GFR calc non Af Amer: 44 mL/min — ABNORMAL LOW (ref >60)
GFR, EST AFRICAN AMERICAN: 50 mL/min — AB (ref >60)
Glucose, Bld: 169 mg/dL — ABNORMAL HIGH (ref 65–99)
Potassium: 2.6 mmol/L — CL (ref 3.5–5.1)
Sodium: 146 mmol/L — ABNORMAL HIGH (ref 135–145)

## 2014-07-03 LAB — CULTURE, RESPIRATORY

## 2014-07-03 LAB — GLUCOSE, CAPILLARY
GLUCOSE-CAPILLARY: 240 mg/dL — AB (ref 65–99)
GLUCOSE-CAPILLARY: 151 mg/dL — AB (ref 65–99)
GLUCOSE-CAPILLARY: 106 mg/dL — AB (ref 65–99)
Glucose-Capillary: 246 mg/dL — ABNORMAL HIGH (ref 65–99)
Glucose-Capillary: 136 mg/dL — ABNORMAL HIGH (ref 65–99)
Glucose-Capillary: 58 mg/dL — ABNORMAL LOW (ref 65–99)

## 2014-07-03 MED ORDER — POTASSIUM CHLORIDE 10 MEQ/100ML IV SOLN
10.0000 meq | INTRAVENOUS | Status: AC
  Administered 2014-07-03 (×3): 10 meq via INTRAVENOUS
  Filled 2014-07-03 (×3): qty 100

## 2014-07-03 MED ORDER — INSULIN ASPART 100 UNIT/ML ~~LOC~~ SOLN
0.0000 [IU] | Freq: Three times a day (TID) | SUBCUTANEOUS | Status: DC
  Administered 2014-07-03: 3 [IU] via SUBCUTANEOUS
  Administered 2014-07-04: 4 [IU] via SUBCUTANEOUS

## 2014-07-03 MED ORDER — POTASSIUM CHLORIDE CRYS ER 20 MEQ PO TBCR: 40.0000 meq | EXTENDED_RELEASE_TABLET | Freq: Once | ORAL | Status: DC

## 2014-07-03 NOTE — Progress Notes (Signed)
Potassium 2.6 result texted to MD. SRP, RN

## 2014-07-03 NOTE — Progress Notes (Addendum)
Patient ID: LOWEN MANSOURI, female   DOB: [birth year], 74 y.o.   MRN: [id number] [facility] PROGRESS NOTE  Lucille Witts Paganelli YQM:[id number] DOB: 10/13/1940 DOA: 06/30/2014 PCP: [name], MD  Brief narrative:    74 year old female, nursing home resident, past medical history significant for advanced dementia, recent hospitalization in April 2016 for cognitive decline and acute encephalopathy thought to be secondary to urinary tract infection, also apparently has persistent MRSA UTI in SNF. She was on vancomycin 06/22/14 through 06/28/14. Patient presented to [facility] with respiratory distress, altered mental status and urinary tract infection. On admission,, T max was 105 F, patient was obtunded and in circulatory shock needing emergent intubation, pressors support and fluid resuscitation. Her blood work indicated creatinine of 2.4 thought to be secondary to acute tubular necrosis. Additionally she had hyper osmolar nonketotic hyperglycemia with CBGs in 600. She also had lactic acidosis. Patient was admitted by critical care medicine. [facility] assumed as primary team starting 07/03/2014.  Barrier to discharge: patient has hypokalemia this morning. We will supplement potassium IV route. Anticipated discharge date 07/04/2014 to skilled nursing facility.   Assessment/Plan:    Principal problem: Ventilatory dependent respiratory failure / acute respiratory failure with hypoxia - Patient was lethargic, obtunded on the admission. She required emergent intubation. Critical care medicine was consulted at the time of the admission. - Patient subsequently extubated. - Respiratory status remains stable.  Active Problems: Septic shock / Severe sepsis secondary to yeast urinary tract infection / urinary tract infection secondary to indwelling Foley catheter / leukocytosis / lactic acidosis  - Patient was in septic shock at the time of the admission.patient was admitted by critical care  medicine and has required pressor support on the admission.she is off pressors from 07/01/2014. - Blood pressure this morning 156/71. - Continue zyvox 600 mg Q 12 hours. She was on Zosyn but it was stopped 07/02/2014. - Blood culture from 06/30/2014 grew staph coagulase-negative species. Urine culture grew yeast. She completed diflucan 07/01/2014 (of note she had candida vaginitis for which reason she was given fluconazole and that would have covered for yeast UTI). - Solu-cortef stopped 07/02/2014.  Acute encephalopathy / advanced dementia - Patient disoriented this morning. She is oriented only to person. - Minimize narcotics, benzos   Acute kidney injury - Likely secondary to sepsis. - Creatinine is improving, 1.84 --> 1.19 in past 48 hours.  Hypernatremia - Possibly from sepsis, dehydration - Sodium 146 this am - Continue to monitor BMP   Hypokalemia - Secondary to sepsis. - Supplement potassium IV route today. - follow-up BMP tomorrow morning.  Stage 2 sacral decubitus ulcer  - Present prior to admission - Appreciate wound care assessment. Stage II sacral decubitus ulcer. Measurement: 3.5cm x 2.0cm x 0.2cm (gluteal cleft pressure ulcer) area on the left buttock and right buttock each aprox 0.5c xm 0.5cm x 0.1cm. Dressing procedure/placement/frequency: Continue soft silicone foam, change every 3 days and PRN. Need to turn and reposition frequently.   Hyperosmolar non ketotic hyperglycemia /  DM type II with renal complications - B2W in 41/3244 8.1 indicating poor glycemic control. - continue current insulin regimen which includes Levemir 10 units at bedtime along with NovoLog 4 units 3 times daily with meals. - Continue sliding scale insulin.  Essential hypertension - Continue Cozaar 100 mg daily   DVT Prophylaxis  - Heparin subcutaneous ordered while patient in hospital.   Code Status: Full.  Family Communication:  Family not at the bedside this am  Disposition Plan:  to  [facility] 07/04/2014.  IV access:  Peripheral IV  Procedures and diagnostic studies:    Dg Chest Port 1 View 2014-07-22    1. Right PICC line in stable position. 2. Cardiomegaly with mild pulmonary vascular prominence and mild interstitial prominence suggesting mild congestive heart failure. Tiny bilateral pleural effusions cannot be excluded. 3. New onset of subsegmental atelectasis both lung bases.   Electronically Signed   By: Marcello Moores  Register   On: 2014/07/22 07:39   Dg Chest Portable 1 View 06/30/2014 Appliances appear to be in satisfactory location. No evidence of active pulmonary disease.   Electronically Signed   By: Lucienne Capers M.D.   On: 06/30/2014 00:46   5/10 Echo - mild LVH, EF 60 to 14%, grade 1 diastolic dysfx  Medical Consultants:  PCCM initial primary team; [place] as primary starting 07/03/14  Other Consultants:  [place] [facility]   IAnti-Infectives:   Linezolid 06/30/2014 -->  Diflucan stopped 07/01/2014  Zosyn (stopped 07/22/14)   [name], MD  [facility] Pager [phone]  Time spent in minutes: 25 minutes  If 7PM-7AM, please contact night-coverage [email or web address] Password [email or web address] 07/03/2014, 10:36 AM   LOS: 3 days    HPI/Subjective: No acute overnight events. Patient reports feeling ok this am. She does not know where she is or the date today.   Objective: Filed Vitals:   Jul 22, 2014 1218 2014-07-22 1251 07-22-14 2213 07/03/14 0454  BP: 113/81 134/69 136/68 156/71  Pulse:  60 67 72  Temp: 98 F (36.7 C) 97.7 F (36.5 C) 98.1 F (36.7 C) 98.2 F (36.8 C)  TempSrc: Oral Oral Oral Oral  Resp: 16 22 19 18   Height:      Weight:    78.699 kg (173 lb 8 oz)  SpO2: 100% 99% 98% 97%    Intake/Output Summary (Last 24 hours) at 07/03/14 1036 Last data filed at 07/22/2014 1900  Gross per 24 hour  Intake    120 ml  Output    420 ml  Net   -300 ml    Exam:   General:  Pt is alert, disoriented   Cardiovascular: Regular rate and rhythm, S1/S2 (+)  Respiratory: Clear  to auscultation bilaterally, no wheezing, no crackles, no rhonchi  Abdomen: Soft, non tender, non distended, bowel sounds present  Extremities: No edema, pulses DP and PT palpable bilaterally  Neuro: Grossly nonfocal  Data Reviewed: Basic Metabolic Panel:  Recent Labs Lab 06/30/14 0023 06/30/14 0711 07/01/14 0553 07/22/2014 0500 07/03/14 0408  NA 150* 153* 147* 145 146*  K 4.8 3.3* 3.6 3.1* 2.6*  CL 115* 121* 116* 117* 114*  CO2 22 19* 20* 23 21*  GLUCOSE 680* 240* 243* 195* 169*  BUN 60* 51* 52* 48* 35*  CREATININE 2.42* 1.98* 1.84* 1.57* 1.19*  CALCIUM 10.3 9.1 9.3 8.9 9.0  MG  --  1.9 1.8  --   --   PHOS  --  2.1* 3.0  --   --    Liver Function Tests:  Recent Labs Lab 06/30/14 0023 06/30/14 0711  AST 26 22  ALT 13* 9*  ALKPHOS 71 55  BILITOT 0.8 0.4  PROT 7.9 6.8  ALBUMIN 3.7 3.2*    Recent Labs Lab 06/30/14 0711  LIPASE 36  AMYLASE 137*   No results for input(s): AMMONIA in the last 168 hours. CBC:  Recent Labs Lab 06/30/14 0023 06/30/14 0711 07/01/14 0553 22-Jul-2014 0500 07/03/14 0408  WBC 22.0* 14.2* 14.7* 9.3 8.3  NEUTROABS 20.5* 12.5*  --   --   --  HGB 13.6 11.8* 10.2* 8.9* 9.7*  HCT 44.6 38.2 32.6* 27.6* 30.1*  MCV 87.3 86.6 85.3 83.6 81.8  PLT 320 184 150 127* 135*   Cardiac Enzymes:  Recent Labs Lab 06/30/14 0711 06/30/14 1455 06/30/14 2100  TROPONINI 0.07* 0.05* 0.04*   BNP: Invalid input(s): POCBNP CBG:  Recent Labs Lab 07/02/14 0732 07/02/14 1142 07/02/14 1739 07/02/14 2214 07/03/14 0742  GLUCAP 157* 237* 244* 246* 151*    Culture, respiratory (NON-Expectorated)     Status: None (Preliminary result)   Collection Time: 06/30/14 12:03 AM  Result Value Ref Range Status   Specimen Description SPUTUM  Final   Special Requests NONE  Final   Gram Stain   Final   Culture   Final    ABUNDANT METHICILLIN RESISTANT STAPHYLOCOCCUS AUREUS      Susceptibility   Methicillin resistant staphylococcus aureus - MIC*     CLINDAMYCIN >=8 RESISTANT Resistant     ERYTHROMYCIN >=8 RESISTANT Resistant     GENTAMICIN <=0.5 SENSITIVE Sensitive     LEVOFLOXACIN >=8 RESISTANT Resistant     OXACILLIN >=4 RESISTANT Resistant     PENICILLIN >=0.5 RESISTANT Resistant     RIFAMPIN <=0.5 SENSITIVE Sensitive     TRIMETH/SULFA <=10 SENSITIVE Sensitive     VANCOMYCIN 1 SENSITIVE Sensitive     TETRACYCLINE <=1 SENSITIVE Sensitive   Culture, blood (routine x 2)     Status: None   Collection Time: 06/30/14 12:10 AM  Result Value Ref Range Status   Specimen Description BLOOD LAC  Final   Special Requests BOTTLES DRAWN AEROBIC AND ANAEROBIC 5ML  Final   Culture   Final    STAPHYLOCOCCUS SPECIES (COAGULASE NEGATIVE)    Report Status 07/02/2014 FINAL  Final  Culture, blood (routine x 2)     Status: None (Preliminary result)   Collection Time: 06/30/14 12:20 AM  Result Value Ref Range Status   Specimen Description BLOOD RIGHT HAND  Final   Special Requests BOTTLES DRAWN AEROBIC AND ANAEROBIC 5ML  Final   Culture   Final           BLOOD CULTURE RECEIVED NO GROWTH TO DATE   Report Status PENDING  Incomplete  MRSA PCR Screening     Status: Abnormal   Collection Time: 06/30/14  5:30 AM  Result Value Ref Range Status   MRSA by PCR POSITIVE (A) NEGATIVE Final  Urine culture     Status: None   Collection Time: 06/30/14  6:02 AM  Result Value Ref Range Status   Specimen Description URINE, CATHETERIZED  Final   Culture YEAST  Final   Report Status 07/01/2014 FINAL  Final  Culture, respiratory (tracheal aspirate)     Status: None (Preliminary result)   Collection Time: 06/30/14 11:17 AM  Result Value Ref Range Status   Specimen Description TRACHEAL ASPIRATE  Final    ABUNDANT STAPHYLOCOCCUS AUREUS MODERATE CANDIDA ALBICANS    Report Status PENDING  Incomplete  Clostridium Difficile by PCR     Status: None   Collection Time: 07/02/14 11:07 AM  Result Value Ref Range Status   C difficile by pcr NEGATIVE NEGATIVE Final      Scheduled Meds: . aspirin  81 mg Oral Daily  . feeding supplement (ENSURE ENLIVE)  237 mL Oral BID BM  . heparin  5,000 Units Subcutaneous 3 times per day  . insulin aspart  0-15 Units Subcutaneous TID WC  . insulin aspart  0-5 Units Subcutaneous QHS  . insulin aspart  4  Units Subcutaneous TID WC  . insulin detemir  10 Units Subcutaneous QHS  . linezolid  600 mg Oral Q12H  . losartan  100 mg Oral Daily  . mupirocin ointment  1 application Nasal BID  . pantoprazole  40 mg Oral Daily  . potassium chloride  10 mEq Intravenous Q1 Hr x 3  . pramipexole  0.375 mg Oral TID

## 2014-07-03 NOTE — Progress Notes (Signed)
ANTIBIOTIC CONSULT NOTE - Follow up  Pharmacy Consult for Zyvox  Indication: MRSA, Pneumonia, UTI  Allergies  Allergen Reactions  . Morphine And Related Other (See Comments)    hallucination   . Oxycodone Other (See Comments)    hallucination   . Sulfonamide Derivatives Swelling    Patient Measurements: Height: 5\' 7"  (170.2 cm) Weight: 173 lb 8 oz (78.699 kg) IBW/kg (Calculated) : 61.6  Vital Signs: Temp: 98.2 F (36.8 C) (05/12 0454) Temp Source: Oral (05/12 0454) BP: 156/71 mmHg (05/12 0454) Pulse Rate: 72 (05/12 0454) Intake/Output from previous day: 05/11 0701 - 05/12 0700 In: 392.5 [P.O.:360; I.V.:20; IV Piggyback:12.5] Out: 610 [Urine:610]  Labs:  Recent Labs  07/01/14 0553 07/02/14 0500 07/03/14 0408  WBC 14.7* 9.3 8.3  HGB 10.2* 8.9* 9.7*  PLT 150 127* 135*  CREATININE 1.84* 1.57* 1.19*   Estimated Creatinine Clearance: 44.1 mL/min (by C-G formula based on Cr of 1.19). No results for input(s): VANCOTROUGH, VANCOPEAK, VANCORANDOM, GENTTROUGH, GENTPEAK, GENTRANDOM, TOBRATROUGH, TOBRAPEAK, TOBRARND, AMIKACINPEAK, AMIKACINTROU, AMIKACIN in the last 72 hours.    Assessment: 28 yoF presented to ED on 5/9 with fever from Nursing home, recently treated with Vancomycin and ciprofloxacin for MRSA UTI.  She was admitted with UTI, respiratory distress, sepsis, HONK, and AMS.  Pharmacy was initially consulted to dose Vancomycin/Cefepiem, changed to Zyvox/Zosyn, then narrowed to Zyvox alone on 5/11.  Zyvox changed to PO on 5/11.  5/9 >> cefepime >> 5/9 5/9 >> vancomycin >> 5/9 5/9 >> zosyn >> 5/11 5/9 >> zyvox >>  Micro: 5/9 Urine: 75 K yeast FINAL 5/9 Blood: 1/2 CNS, 1/2 ngtd 5/9 sputum: MRSA (sens: gent, rifampin, tetracycline, bactrim, Vanc) 5/9 Trach Asp: staph aureus, moderate candida albicans 5/9 MRSA PCR ( +) 5/11 Cdiff PCR: negative  Today, 07/03/2014:  Day #4 Zyvox.  Afebrile, WBC WNL, SCr improving.  Platelets slightly improved, 137k   Goal of  Therapy:  Appropriate abx dosing, eradication of infection.   Plan:   Continue Zyvox 600mg  PO q12h.  Continue to monitor Platelet counts closely.  Continue to hold sertraline d/t risk for serotonin syndrome.  Follow up renal fxn, culture results, and clinical course.  Gretta Arab PharmD, BCPS Pager [phone] 07/03/2014 9:50 AM

## 2014-07-04 DIAGNOSIS — G2581 Restless legs syndrome: Secondary | ICD-10-CM | POA: Diagnosis present

## 2014-07-04 DIAGNOSIS — E559 Vitamin D deficiency, unspecified: Secondary | ICD-10-CM | POA: Diagnosis not present

## 2014-07-04 DIAGNOSIS — Z993 Dependence on wheelchair: Secondary | ICD-10-CM | POA: Diagnosis not present

## 2014-07-04 DIAGNOSIS — Z794 Long term (current) use of insulin: Secondary | ICD-10-CM | POA: Diagnosis not present

## 2014-07-04 DIAGNOSIS — Z882 Allergy status to sulfonamides status: Secondary | ICD-10-CM | POA: Diagnosis not present

## 2014-07-04 DIAGNOSIS — J9601 Acute respiratory failure with hypoxia: Secondary | ICD-10-CM | POA: Diagnosis not present

## 2014-07-04 DIAGNOSIS — J9600 Acute respiratory failure, unspecified whether with hypoxia or hypercapnia: Secondary | ICD-10-CM | POA: Diagnosis not present

## 2014-07-04 DIAGNOSIS — G9340 Encephalopathy, unspecified: Secondary | ICD-10-CM | POA: Diagnosis not present

## 2014-07-04 DIAGNOSIS — N179 Acute kidney failure, unspecified: Secondary | ICD-10-CM | POA: Diagnosis present

## 2014-07-04 DIAGNOSIS — E860 Dehydration: Secondary | ICD-10-CM | POA: Diagnosis present

## 2014-07-04 DIAGNOSIS — E1165 Type 2 diabetes mellitus with hyperglycemia: Secondary | ICD-10-CM | POA: Diagnosis not present

## 2014-07-04 DIAGNOSIS — E46 Unspecified protein-calorie malnutrition: Secondary | ICD-10-CM | POA: Diagnosis not present

## 2014-07-04 DIAGNOSIS — J302 Other seasonal allergic rhinitis: Secondary | ICD-10-CM | POA: Diagnosis not present

## 2014-07-04 DIAGNOSIS — B9562 Methicillin resistant Staphylococcus aureus infection as the cause of diseases classified elsewhere: Secondary | ICD-10-CM | POA: Diagnosis present

## 2014-07-04 DIAGNOSIS — R4702 Dysphasia: Secondary | ICD-10-CM | POA: Diagnosis present

## 2014-07-04 DIAGNOSIS — R112 Nausea with vomiting, unspecified: Secondary | ICD-10-CM | POA: Diagnosis present

## 2014-07-04 DIAGNOSIS — N390 Urinary tract infection, site not specified: Secondary | ICD-10-CM | POA: Diagnosis not present

## 2014-07-04 DIAGNOSIS — Z8614 Personal history of Methicillin resistant Staphylococcus aureus infection: Secondary | ICD-10-CM | POA: Diagnosis not present

## 2014-07-04 DIAGNOSIS — F0390 Unspecified dementia without behavioral disturbance: Secondary | ICD-10-CM | POA: Diagnosis not present

## 2014-07-04 DIAGNOSIS — L8992 Pressure ulcer of unspecified site, stage 2: Secondary | ICD-10-CM | POA: Diagnosis present

## 2014-07-04 DIAGNOSIS — F419 Anxiety disorder, unspecified: Secondary | ICD-10-CM | POA: Diagnosis present

## 2014-07-04 DIAGNOSIS — R509 Fever, unspecified: Secondary | ICD-10-CM | POA: Diagnosis not present

## 2014-07-04 DIAGNOSIS — E119 Type 2 diabetes mellitus without complications: Secondary | ICD-10-CM | POA: Diagnosis present

## 2014-07-04 DIAGNOSIS — A419 Sepsis, unspecified organism: Secondary | ICD-10-CM | POA: Diagnosis not present

## 2014-07-04 DIAGNOSIS — I10 Essential (primary) hypertension: Secondary | ICD-10-CM | POA: Diagnosis present

## 2014-07-04 DIAGNOSIS — L8990 Pressure ulcer of unspecified site, unspecified stage: Secondary | ICD-10-CM | POA: Diagnosis not present

## 2014-07-04 DIAGNOSIS — Z8701 Personal history of pneumonia (recurrent): Secondary | ICD-10-CM | POA: Diagnosis not present

## 2014-07-04 DIAGNOSIS — R2689 Other abnormalities of gait and mobility: Secondary | ICD-10-CM | POA: Diagnosis not present

## 2014-07-04 DIAGNOSIS — L89150 Pressure ulcer of sacral region, unstageable: Secondary | ICD-10-CM | POA: Diagnosis present

## 2014-07-04 DIAGNOSIS — Z79899 Other long term (current) drug therapy: Secondary | ICD-10-CM | POA: Diagnosis not present

## 2014-07-04 DIAGNOSIS — Z7982 Long term (current) use of aspirin: Secondary | ICD-10-CM | POA: Diagnosis not present

## 2014-07-04 DIAGNOSIS — E870 Hyperosmolality and hypernatremia: Secondary | ICD-10-CM | POA: Diagnosis not present

## 2014-07-04 DIAGNOSIS — R339 Retention of urine, unspecified: Secondary | ICD-10-CM | POA: Diagnosis not present

## 2014-07-04 DIAGNOSIS — R1311 Dysphagia, oral phase: Secondary | ICD-10-CM | POA: Diagnosis not present

## 2014-07-04 DIAGNOSIS — M1990 Unspecified osteoarthritis, unspecified site: Secondary | ICD-10-CM | POA: Diagnosis present

## 2014-07-04 DIAGNOSIS — D649 Anemia, unspecified: Secondary | ICD-10-CM | POA: Diagnosis not present

## 2014-07-04 DIAGNOSIS — R4182 Altered mental status, unspecified: Secondary | ICD-10-CM | POA: Diagnosis not present

## 2014-07-04 DIAGNOSIS — Z885 Allergy status to narcotic agent status: Secondary | ICD-10-CM | POA: Diagnosis not present

## 2014-07-04 DIAGNOSIS — R05 Cough: Secondary | ICD-10-CM | POA: Diagnosis not present

## 2014-07-04 DIAGNOSIS — Z515 Encounter for palliative care: Secondary | ICD-10-CM | POA: Diagnosis not present

## 2014-07-04 DIAGNOSIS — L89152 Pressure ulcer of sacral region, stage 2: Secondary | ICD-10-CM | POA: Diagnosis present

## 2014-07-04 DIAGNOSIS — F329 Major depressive disorder, single episode, unspecified: Secondary | ICD-10-CM | POA: Diagnosis present

## 2014-07-04 DIAGNOSIS — R278 Other lack of coordination: Secondary | ICD-10-CM | POA: Diagnosis not present

## 2014-07-04 DIAGNOSIS — K3184 Gastroparesis: Secondary | ICD-10-CM | POA: Diagnosis not present

## 2014-07-04 DIAGNOSIS — Z66 Do not resuscitate: Secondary | ICD-10-CM | POA: Diagnosis not present

## 2014-07-04 DIAGNOSIS — R6521 Severe sepsis with septic shock: Secondary | ICD-10-CM | POA: Diagnosis present

## 2014-07-04 DIAGNOSIS — E780 Pure hypercholesterolemia: Secondary | ICD-10-CM | POA: Diagnosis present

## 2014-07-04 DIAGNOSIS — E11649 Type 2 diabetes mellitus with hypoglycemia without coma: Secondary | ICD-10-CM | POA: Diagnosis not present

## 2014-07-04 DIAGNOSIS — F418 Other specified anxiety disorders: Secondary | ICD-10-CM | POA: Diagnosis not present

## 2014-07-04 DIAGNOSIS — R69 Illness, unspecified: Secondary | ICD-10-CM | POA: Diagnosis not present

## 2014-07-04 DIAGNOSIS — Z833 Family history of diabetes mellitus: Secondary | ICD-10-CM | POA: Diagnosis not present

## 2014-07-04 DIAGNOSIS — G9341 Metabolic encephalopathy: Secondary | ICD-10-CM | POA: Diagnosis present

## 2014-07-04 DIAGNOSIS — R1310 Dysphagia, unspecified: Secondary | ICD-10-CM | POA: Diagnosis not present

## 2014-07-04 DIAGNOSIS — J690 Pneumonitis due to inhalation of food and vomit: Secondary | ICD-10-CM | POA: Diagnosis not present

## 2014-07-04 LAB — BASIC METABOLIC PANEL
ANION GAP: 10 (ref 5–15)
BUN: 15 mg/dL (ref 6–20)
CHLORIDE: 106 mmol/L (ref 101–111)
CO2: 26 mmol/L (ref 22–32)
CREATININE: 0.67 mg/dL (ref 0.44–1.00)
Calcium: 8.7 mg/dL — ABNORMAL LOW (ref 8.9–10.3)
GFR calc Af Amer: >60 mL/min (ref >60)
Glucose, Bld: 106 mg/dL — ABNORMAL HIGH (ref 65–99)
Potassium: 2.7 mmol/L — CL (ref 3.5–5.1)
Sodium: 142 mmol/L (ref 135–145)

## 2014-07-04 LAB — CULTURE, RESPIRATORY W GRAM STAIN

## 2014-07-04 LAB — CBC
HCT: 28.8 % — ABNORMAL LOW (ref 36.0–46.0)
Hemoglobin: 9.5 g/dL — ABNORMAL LOW (ref 12.0–15.0)
MCH: 26.8 pg (ref 26.0–34.0)
MCHC: 33 g/dL (ref 30.0–36.0)
MCV: 81.4 fL (ref 78.0–100.0)
Platelets: 148 10*3/uL — ABNORMAL LOW (ref 150–400)
RBC: 3.54 MIL/uL — ABNORMAL LOW (ref 3.87–5.11)
RDW: 13.7 % (ref 11.5–15.5)
WBC: 7.7 10*3/uL (ref 4.0–10.5)

## 2014-07-04 LAB — GLUCOSE, CAPILLARY
GLUCOSE-CAPILLARY: 102 mg/dL — AB (ref 65–99)
GLUCOSE-CAPILLARY: 94 mg/dL (ref 65–99)
GLUCOSE-CAPILLARY: 199 mg/dL — AB (ref 65–99)

## 2014-07-04 LAB — CULTURE, RESPIRATORY

## 2014-07-04 MED ORDER — LINEZOLID 600 MG PO TABS: 600.0000 mg | ORAL_TABLET | Freq: Two times a day (BID) | ORAL | Status: DC

## 2014-07-04 MED ORDER — INSULIN ASPART 100 UNIT/ML ~~LOC~~ SOLN: 0.0000 [IU] | Freq: Three times a day (TID) | SUBCUTANEOUS | Status: DC

## 2014-07-04 MED ORDER — HEPARIN SOD (PORK) LOCK FLUSH 100 UNIT/ML IV SOLN
250.0000 [IU] | INTRAVENOUS | Status: AC | PRN
Start: 2014-07-04 — End: 2014-07-04
  Administered 2014-07-04: 250 [IU]

## 2014-07-04 MED ORDER — METOPROLOL SUCCINATE ER 50 MG PO TB24: 50.0000 mg | ORAL_TABLET | Freq: Every day | ORAL | Status: DC

## 2014-07-04 MED ORDER — POTASSIUM CHLORIDE 10 MEQ/100ML IV SOLN
10.0000 meq | INTRAVENOUS | Status: AC
  Administered 2014-07-04 (×3): 10 meq via INTRAVENOUS
  Filled 2014-07-04 (×2): qty 100

## 2014-07-04 MED ORDER — INSULIN ASPART 100 UNIT/ML ~~LOC~~ SOLN: 4.0000 [IU] | Freq: Three times a day (TID) | SUBCUTANEOUS | Status: DC

## 2014-07-04 MED ORDER — ENSURE ENLIVE PO LIQD: 237.0000 mL | Freq: Two times a day (BID) | ORAL | Status: DC

## 2014-07-04 MED ORDER — INSULIN DETEMIR 100 UNIT/ML ~~LOC~~ SOLN: 10.0000 [IU] | Freq: Every day | SUBCUTANEOUS | Status: DC

## 2014-07-04 MED ORDER — MIRTAZAPINE 7.5 MG PO TABS: 7.5000 mg | ORAL_TABLET | Freq: Every day | ORAL | Status: DC

## 2014-07-04 MED ORDER — ALPRAZOLAM 0.5 MG PO TABS: 0.5000 mg | ORAL_TABLET | Freq: Two times a day (BID) | ORAL | Status: DC | PRN

## 2014-07-04 MED ORDER — PRAMIPEXOLE DIHYDROCHLORIDE 0.125 MG PO TABS: 0.3750 mg | ORAL_TABLET | Freq: Three times a day (TID) | ORAL | Status: DC

## 2014-07-04 NOTE — Progress Notes (Signed)
CRITICAL VALUE ALERT  Critical value received:  K+ 2.7  Date of notification:  07/04/14  Time of notification:  0950  Critical value read back:Yes.    Nurse who received alert:  Catie   MD notified (1st page):  Charlies Silvers  Time of first page:  [phone]  MD notified (2nd page):  Time of second page:  Responding MD:  Charlies Silvers  Time MD responded:  [phone]

## 2014-07-04 NOTE — Discharge Summary (Signed)
Physician Discharge Summary  [name] DXA:[id number] DOB: Nov 25, 1940 DOA: 06/30/2014  PCP: [name], MD  Admit date: 06/30/2014 Discharge date: 07/04/2014  Recommendations for Outpatient Follow-up:  1. Continually Linezolid for 4 more days on discharge per ID recommendations, Dr. Lita Mains  Discharge Diagnoses:  Active Problems:   UTI (lower urinary tract infection)   Sepsis   Acute encephalopathy   Acute kidney injury   Acute respiratory failure with hypoxia   Septic shock   Lactic acidosis   Dementia   Acute respiratory failure with hypoxemia   Hypernatremia    Discharge Condition: stable   Diet recommendation: as tolerated   History of present illness:   74 year old female, nursing home resident, past medical history significant for advanced dementia, recent hospitalization in April 2016 for cognitive decline and acute encephalopathy thought to be secondary to urinary tract infection, also apparently has persistent MRSA UTI in SNF. She was on vancomycin 06/22/14 through 06/28/14. Patient presented to [facility] long hospital with respiratory distress, altered mental status and urinary tract infection.   On admission,, T max was 105 F, patient was obtunded and in circulatory shock needing emergent intubation, pressors support and fluid resuscitation. Her blood work indicated creatinine of 2.4 thought to be secondary to acute tubular necrosis. Additionally she had hyper osmolar nonketotic hyperglycemia with CBGs in 600. She also had lactic acidosis. Patient was admitted by critical care medicine. [facility] assumed as primary team starting 07/03/2014.   Assessment/Plan:    Principal problem: Ventilatory dependent respiratory failure / acute respiratory failure with hypoxia - Patient was lethargic, obtunded on the admission. She required emergent intubation. Critical care medicine was consulted at the time of the admission. - Patient subsequently extubated. - Patient  remains stable respiratory system wise  Active Problems: Septic shock / Severe sepsis secondary to yeast urinary tract infection / urinary tract infection secondary to indwelling Foley catheter / leukocytosis / lactic acidosis  - Patient was in septic shock at the time of the admission.patient was admitted by critical care medicine and has required pressor support on the admission.she is off pressors from 07/01/2014. - Blood pressure is stable, 143/85. Continue losartan and metoprolol on discharge. Please note we have decreased metoprolol from 100 mg to 50 mg. - Continually Zyvox 600 mg twice daily for 4 days on discharge per infectious disease recommendations. - Off note, patient was given fluconazole for candida vaginitis which would have covered for yeast UTI as well.  Acute encephalopathy / advanced dementia - Patient disoriented this morning. She is oriented only to person. - Minimize narcotics, benzos  - We did not resume Zoloft on discharge because patient was not on this medication in hospital.  Acute kidney injury - Likely secondary to sepsis. - Creatinine resolved likely with IV fluids.  Hypernatremia - Possibly from sepsis, dehydration - Sodium is within normal limits this morning. Likely resolved with IV fluids.  Hypokalemia - Secondary to sepsis. - Continue to supplement potassium in a nursing home as prescribed.  Stage 2 sacral decubitus ulcer  - Present prior to admission - Appreciate wound care assessment. Stage II sacral decubitus ulcer. Measurement: 3.5cm x 2.0cm x 0.2cm (gluteal cleft pressure ulcer) area on the left buttock and right buttock each aprox 0.5c xm 0.5cm x 0.1cm. Dressing procedure/placement/frequency: Continue soft silicone foam, change every 3 days and PRN. Need to turn and reposition frequently.   Hyperosmolar non ketotic hyperglycemia / DM type II with renal complications - M0N in 76/0962 8.1 indicating poor glycemic control. -  Continue in SNF  current insulin regimen which includes Levemir 10 units at bedtime along with NovoLog 4 units 3 times daily with meals.  Essential hypertension - Continue Cozaar 100 mg daily and metoprolol 50 mg daily    DVT Prophylaxis  - Heparin subcutaneous ordered while patient in hospital.   Code Status: Full.  Family Communication: Family at the bedside this am    IV access:  Peripheral IV  Procedures and diagnostic studies:   Dg Chest Port 1 View 07/02/2014 1. Right PICC line in stable position. 2. Cardiomegaly with mild pulmonary vascular prominence and mild interstitial prominence suggesting mild congestive heart failure. Tiny bilateral pleural effusions cannot be excluded. 3. New onset of subsegmental atelectasis both lung bases. Electronically Signed By: Marcello Moores Register On: 07/02/2014 07:39   Dg Chest Portable 1 View 06/30/2014 Appliances appear to be in satisfactory location. No evidence of active pulmonary disease. Electronically Signed By: Lucienne Capers M.D. On: 06/30/2014 00:46   5/10 Echo - mild LVH, EF 60 to 59%, grade 1 diastolic dysfx  Medical Consultants:  PCCM initial primary team; [place] as primary starting 07/03/14  Other Consultants:  [place] [facility]   IAnti-Infectives:   Linezolid 06/30/2014 --> for 4 days on discharge  Diflucan stopped 07/01/2014  Zosyn (stopped 07/02/14)  Signed:  [name], MD  [facility] 07/04/2014, 10:25 AM  Pager #: [phone]  Time spent in minutes: more than 30 minutes   Discharge Exam: Filed Vitals:   07/04/14 0519  BP: 143/85  Pulse: 86  Temp: 98.1 F (36.7 C)  Resp: 17   Filed Vitals:   07/03/14 0454 07/03/14 1338 07/03/14 2115 07/04/14 0519  BP: 156/71 136/68 166/86 143/85  Pulse: 72 80 92 86  Temp: 98.2 F (36.8 C) 98.7 F (37.1 C) 98.4 F (36.9 C) 98.1 F (36.7 C)  TempSrc: Oral Axillary Oral Oral  Resp: 18 16 18 17   Height:      Weight: 78.699 kg (173 lb 8 oz)   78.4 kg (172  lb 13.5 oz)  SpO2: 97%  97% 98%    General: Pt is alert,  not in acute distress Cardiovascular: Regular rate and rhythm, S1/S2 (+) Respiratory: Clear to auscultation bilaterally, no wheezing, no crackles, no rhonchi Abdominal: Soft, non tender, non distended, bowel sounds +, no guarding Extremities: no cyanosis, pulses palpable bilaterally DP and PT Neuro: Grossly nonfocal  Discharge Instructions  Discharge Instructions    Call MD for:  difficulty breathing, headache or visual disturbances    Complete by:  As directed      Call MD for:  persistant nausea and vomiting    Complete by:  As directed      Call MD for:  severe uncontrolled pain    Complete by:  As directed      Diet - low sodium heart healthy    Complete by:  As directed      Increase activity slowly    Complete by:  As directed             Medication List    STOP taking these medications        Pramipexole Dihydrochloride 0.375 MG Tb24  Replaced by:  pramipexole 0.125 MG tablet     sertraline 50 MG tablet  Commonly known as:  ZOLOFT     traZODone 50 MG tablet  Commonly known as:  DESYREL     UNABLE TO FIND     verapamil 240 MG CR tablet  Commonly known as:  CALAN-SR      TAKE these medications        acetaminophen 500 MG tablet  Commonly known as:  TYLENOL  Take 500 mg by mouth every 4 (four) hours as needed for mild pain, fever or headache (99.5 to 101).     ALPRAZolam 0.5 MG tablet  Commonly known as:  XANAX  Take 1 tablet (0.5 mg total) by mouth 2 (two) times daily as needed for anxiety.     aspirin 81 MG chewable tablet  Chew 81 mg by mouth daily.     AZO CRANBERRY 250-30 MG Tabs  Take 1 tablet by mouth daily.     B-COMPLEX/B-12 PO  Take 1 tablet by mouth daily.     bisacodyl 10 MG suppository  Commonly known as:  DULCOLAX  Place 10 mg rectally as needed for moderate constipation.     cholecalciferol 1000 UNITS tablet  Commonly known as:  VITAMIN D  Take 1,000 Units by mouth  daily.     feeding supplement (ENSURE ENLIVE) Liqd  Take 237 mLs by mouth 2 (two) times daily between meals.     insulin aspart 100 UNIT/ML injection  Commonly known as:  novoLOG  Inject 0-20 Units into the skin 3 (three) times daily with meals.     insulin aspart 100 UNIT/ML injection  Commonly known as:  novoLOG  Inject 4 Units into the skin 3 (three) times daily with meals.     insulin detemir 100 UNIT/ML injection  Commonly known as:  LEVEMIR  Inject 0.1 mLs (10 Units total) into the skin at bedtime.     linezolid 600 MG tablet  Commonly known as:  ZYVOX  Take 1 tablet (600 mg total) by mouth every 12 (twelve) hours.     loperamide 2 MG capsule  Commonly known as:  IMODIUM  Take 2 mg by mouth as needed for diarrhea or loose stools. Do not exceed 8 doses in 24 hours     loratadine 10 MG tablet  Commonly known as:  CLARITIN  Take 10 mg by mouth daily as needed for allergies.     losartan 100 MG tablet  Commonly known as:  COZAAR  Take 100 mg by mouth daily.     Magnesium Oxide 500 MG Tabs  Take 500 mg by mouth daily.     metoprolol succinate 50 MG 24 hr tablet  Commonly known as:  TOPROL-XL  Take 1 tablet (50 mg total) by mouth daily.     mirtazapine 7.5 MG tablet  Commonly known as:  REMERON  Take 1 tablet (7.5 mg total) by mouth at bedtime.     omeprazole 40 MG capsule  Commonly known as:  PRILOSEC  Take 40 mg by mouth daily.     ondansetron 4 MG tablet  Commonly known as:  ZOFRAN  Take 4 mg by mouth every 8 (eight) hours as needed for nausea or vomiting.     polyethylene glycol packet  Commonly known as:  MIRALAX / GLYCOLAX  Take 17 g by mouth daily.     potassium chloride SA 20 MEQ tablet  Commonly known as:  K-DUR,KLOR-CON  Take 40 mEq by mouth daily.     pramipexole 0.125 MG tablet  Commonly known as:  MIRAPEX  Take 3 tablets (0.375 mg total) by mouth 3 (three) times daily.     senna-docusate 8.6-50 MG per tablet  Commonly known as:  Senokot-S   Take 1 tablet by mouth at bedtime as needed for  mild constipation.           Follow-up Information    Follow up with [name], MD. Schedule an appointment as soon as possible for a visit in 2 weeks.   Specialty:  Internal Medicine   Why:  Follow up appt after recent hospitalization   Contact information:   [place] [address] [phone]        The results of significant diagnostics from this hospitalization (including imaging, microbiology, ancillary and laboratory) are listed below for reference.    Significant Diagnostic Studies: Dg Chest Port 1 View  07/02/2014   CLINICAL DATA:  Atelectasis.  EXAM: PORTABLE CHEST - 1 VIEW  COMPARISON:  06/30/2014.  FINDINGS: Interim removal of endotracheal tube. Right PICC line in stable position. Mild fullness of the mediastinum noted, this is most likely related to tortuous thoracic aorta and prominent great vessels as well as AP portable technique . Cardiomegaly with mild pulmonary vascular prominence interstitial prominence suggesting mild congestive heart failure. Bibasilar subsegmental atelectasis. Tiny bilateral pleural effusions cannot be excluded. No pneumothorax.  IMPRESSION: 1. Right PICC line in stable position. 2. Cardiomegaly with mild pulmonary vascular prominence and mild interstitial prominence suggesting mild congestive heart failure. Tiny bilateral pleural effusions cannot be excluded. 3. New onset of subsegmental atelectasis both lung bases.   Electronically Signed   By: Marcello Moores  Register   On: 07/02/2014 07:39   Dg Chest Portable 1 View  06/30/2014   CLINICAL DATA:  Endotracheal tube placement. Difficulty breathing. Fever.  EXAM: PORTABLE CHEST - 1 VIEW  COMPARISON:  05/28/2014  FINDINGS: Endotracheal tube tip measures 2.6 cm above the carina. Right PICC line tip overlies the upper SVC region. No pneumothorax. Shallow inspiration. Heart size and pulmonary vascularity are normal. No focal airspace disease  or consolidation in the lungs. No blunting of costophrenic angles. Tortuous aorta.  IMPRESSION: Appliances appear to be in satisfactory location. No evidence of active pulmonary disease.   Electronically Signed   By: Lucienne Capers M.D.   On: 06/30/2014 00:46    Microbiology: Recent Results (from the past 240 hour(s))  Culture, respiratory (NON-Expectorated)     Status: None   Collection Time: 06/30/14 12:03 AM  Result Value Ref Range Status   Specimen Description SPUTUM  Final   Special Requests NONE  Final   Gram Stain   Final    ABUNDANT WBC PRESENT,BOTH PMN AND MONONUCLEAR ABUNDANT SQUAMOUS EPITHELIAL CELLS PRESENT ABUNDANT GRAM POSITIVE COCCI IN PAIRS IN CHAINS ABUNDANT GRAM POSITIVE RODS Gram Stain Report Called to,Read Back By and Verified With: Gram Stain Report Called to,Read Back By and Verified With: DENA.A AT 10:48 ON [id number] BY WEBSP    Culture   Final    ABUNDANT METHICILLIN RESISTANT STAPHYLOCOCCUS AUREUS Note: RIFAMPIN AND GENTAMICIN SHOULD NOT BE USED AS SINGLE DRUGS FOR TREATMENT OF STAPH INFECTIONS. Performed at [facility]    Report Status 07/03/2014 FINAL  Final   Organism ID, Bacteria METHICILLIN RESISTANT STAPHYLOCOCCUS AUREUS  Final      Susceptibility   Methicillin resistant staphylococcus aureus - MIC*    CLINDAMYCIN >=8 RESISTANT Resistant     ERYTHROMYCIN >=8 RESISTANT Resistant     GENTAMICIN <=0.5 SENSITIVE Sensitive     LEVOFLOXACIN >=8 RESISTANT Resistant     OXACILLIN >=4 RESISTANT Resistant     PENICILLIN >=0.5 RESISTANT Resistant     RIFAMPIN <=0.5 SENSITIVE Sensitive     TRIMETH/SULFA <=10 SENSITIVE Sensitive     VANCOMYCIN 1 SENSITIVE Sensitive  TETRACYCLINE <=1 SENSITIVE Sensitive     * ABUNDANT METHICILLIN RESISTANT STAPHYLOCOCCUS AUREUS  Culture, blood (routine x 2)     Status: None   Collection Time: 06/30/14 12:10 AM  Result Value Ref Range Status   Specimen Description BLOOD LAC  Final   Special Requests BOTTLES DRAWN  AEROBIC AND ANAEROBIC 5ML  Final   Culture   Final    STAPHYLOCOCCUS SPECIES (COAGULASE NEGATIVE) Note: THE SIGNIFICANCE OF ISOLATING THIS ORGANISM FROM A SINGLE SET OF BLOOD CULTURES WHEN MULTIPLE SETS ARE DRAWN IS UNCERTAIN. PLEASE NOTIFY THE MICROBIOLOGY DEPARTMENT WITHIN ONE WEEK IF SPECIATION AND SENSITIVITIES ARE REQUIRED. Note: Gram Stain Report Called to,Read Back By and Verified With: [facility] AT 6:41 A.M. ON 07/01/2014 WARRB Performed at [facility]    Report Status 07/02/2014 FINAL  Final  Culture, blood (routine x 2)     Status: None (Preliminary result)   Collection Time: 06/30/14 12:20 AM  Result Value Ref Range Status   Specimen Description BLOOD RIGHT HAND  Final   Special Requests BOTTLES DRAWN AEROBIC AND ANAEROBIC 5ML  Final   Culture   Final           BLOOD CULTURE RECEIVED NO GROWTH TO DATE CULTURE WILL BE HELD FOR 5 DAYS BEFORE ISSUING A FINAL NEGATIVE REPORT Performed at [facility]    Report Status PENDING  Incomplete  MRSA PCR Screening     Status: Abnormal   Collection Time: 06/30/14  5:30 AM  Result Value Ref Range Status   MRSA by PCR POSITIVE (A) NEGATIVE Final    Comment:        The GeneXpert MRSA Assay (FDA approved for NASAL specimens only), is one component of a comprehensive MRSA colonization surveillance program. It is not intended to diagnose MRSA infection nor to guide or monitor treatment for MRSA infections. RESULT CALLED TO, READ BACK BY AND VERIFIED WITH: [place] RN AT 0705 ON 05.09.16 BY SHUEA   Urine culture     Status: None   Collection Time: 06/30/14  6:02 AM  Result Value Ref Range Status   Specimen Description URINE, CATHETERIZED  Final   Special Requests NONE  Final   Colony Count   Final    75,000 COLONIES/ML Performed at [facility]    Culture YEAST Performed at [facility]   Final   Report Status 07/01/2014 FINAL  Final  Culture, respiratory (tracheal aspirate)     Status: None    Collection Time: 06/30/14 11:17 AM  Result Value Ref Range Status   Specimen Description TRACHEAL ASPIRATE  Final   Special Requests NONE  Final   Gram Stain   Final    ABUNDANT WBC PRESENT, PREDOMINANTLY PMN RARE SQUAMOUS EPITHELIAL CELLS PRESENT FEW GRAM POSITIVE COCCI IN PAIRS FEW GRAM VARIABLE ROD Gram Stain Report Called to,Read Back By and Verified With: Gram Stain Report Called to,Read Back By and Verified With: KATIE.V AT [place] ON [id number] BY WEBSP    Culture   Final    ABUNDANT METHICILLIN RESISTANT STAPHYLOCOCCUS AUREUS Note: RIFAMPIN AND GENTAMICIN SHOULD NOT BE USED AS SINGLE DRUGS FOR TREATMENT OF STAPH INFECTIONS. MODERATE CANDIDA ALBICANS Performed at [facility]    Report Status 07/04/2014 FINAL  Final   Organism ID, Bacteria METHICILLIN RESISTANT STAPHYLOCOCCUS AUREUS  Final      Susceptibility   Methicillin resistant staphylococcus aureus - MIC*    CLINDAMYCIN >=8 RESISTANT Resistant     ERYTHROMYCIN >=8 RESISTANT Resistant  GENTAMICIN <=0.5 SENSITIVE Sensitive     LEVOFLOXACIN >=8 RESISTANT Resistant     OXACILLIN >=4 RESISTANT Resistant     PENICILLIN >=0.5 RESISTANT Resistant     RIFAMPIN <=0.5 SENSITIVE Sensitive     TRIMETH/SULFA <=10 SENSITIVE Sensitive     VANCOMYCIN <=0.5 SENSITIVE Sensitive     TETRACYCLINE <=1 SENSITIVE Sensitive     * ABUNDANT METHICILLIN RESISTANT STAPHYLOCOCCUS AUREUS  Clostridium Difficile by PCR     Status: None   Collection Time: 07/02/14 11:07 AM  Result Value Ref Range Status   C difficile by pcr NEGATIVE NEGATIVE Final     Labs: Basic Metabolic Panel:  Recent Labs Lab 06/30/14 0711 07/01/14 0553 07/02/14 0500 07/03/14 0408 07/04/14 0900  NA 153* 147* 145 146* 142  K 3.3* 3.6 3.1* 2.6* 2.7*  CL 121* 116* 117* 114* 106  CO2 19* 20* 23 21* 26  GLUCOSE 240* 243* 195* 169* 106*  BUN 51* 52* 48* 35* 15  CREATININE 1.98* 1.84* 1.57* 1.19* 0.67  CALCIUM 9.1 9.3 8.9 9.0 8.7*  MG 1.9 1.8  --   --   --    PHOS 2.1* 3.0  --   --   --    Liver Function Tests:  Recent Labs Lab 06/30/14 0023 06/30/14 0711  AST 26 22  ALT 13* 9*  ALKPHOS 71 55  BILITOT 0.8 0.4  PROT 7.9 6.8  ALBUMIN 3.7 3.2*    Recent Labs Lab 06/30/14 0711  LIPASE 36  AMYLASE 137*   No results for input(s): AMMONIA in the last 168 hours. CBC:  Recent Labs Lab 06/30/14 0023 06/30/14 0711 07/01/14 0553 07/02/14 0500 07/03/14 0408 07/04/14 0900  WBC 22.0* 14.2* 14.7* 9.3 8.3 7.7  NEUTROABS 20.5* 12.5*  --   --   --   --   HGB 13.6 11.8* 10.2* 8.9* 9.7* 9.5*  HCT 44.6 38.2 32.6* 27.6* 30.1* 28.8*  MCV 87.3 86.6 85.3 83.6 81.8 81.4  PLT 320 184 150 127* 135* 148*   Cardiac Enzymes:  Recent Labs Lab 06/30/14 0711 06/30/14 1455 06/30/14 2100  TROPONINI 0.07* 0.05* 0.04*   BNP: BNP (last 3 results)  Recent Labs  06/30/14 0711  BNP 202.3*    ProBNP (last 3 results) No results for input(s): PROBNP in the last 8760 hours.  CBG:  Recent Labs Lab 07/03/14 1144 07/03/14 1746 07/03/14 1859 07/03/14 2113 07/04/14 0801  GLUCAP 136* 58* 106* 102* 94

## 2014-07-04 NOTE — Progress Notes (Signed)
Physical Therapy Treatment Patient Details Name: AZHARIA SURRATT MRN: [id number] DOB: 05-Mar-1940 Today's Date: 07/04/2014    History of Present Illness Pt admitted from SNF with fever, difficulty breathing and AMS    PT Comments    Daughter in room.  Pt in bed found to be incont urine and loose stools.  Performed ned mobilty side to side rolling several time for hygiene/skin care.  Assited from supine to EOB + 2 assist.  Pt was only able to sit EOB x 3 min requiring Mod Assist as she drifted posteriorly.  Pt required + 2 assist to stand and take a few pivoted turns to recliner.  Unable to utilize a walker as pt demon weak B UE's. Positioned in recliner.   Follow Up Recommendations  SNF     Equipment Recommendations       Recommendations for Other Services       Precautions / Restrictions Restrictions Weight Bearing Restrictions: No    Mobility  Bed Mobility Overal bed mobility: Needs Assistance;+2 for physical assistance Bed Mobility: Rolling;Sidelying to Sit Rolling: +2 for physical assistance;Mod assist;Max assist Sidelying to sit: +2 for physical assistance;Max assist;Total assist       General bed mobility comments: 50% VC's to increase self participation.  Rolling side to side as pt was found to be incont urine/BM.  Assisted to EOB.  Sat EOB x 3 min requiring Mod Assist to prevent posterior LOB.    Transfers Overall transfer level: Needs assistance Equipment used: None Transfers: Sit to/from [facility] Sit to Stand: Max assist;+2 physical assistance Stand pivot transfers: Max assist;+2 physical assistance       General transfer comment: elevated bed.  Assisted to recliner 1/4 turn + 2 HHA as pt was unable to functional utilize   Ambulation/Gait                 Financial [employer] (Stroke Patients Only)       Balance                                    Cognition  Arousal/Alertness: Awake/alert Behavior During Therapy: Flat affect Overall Cognitive Status: History of cognitive impairments - at baseline                      Exercises      General Comments        Pertinent Vitals/Pain Pain Assessment: No/denies pain    Home Living                      Prior Function            PT Goals (current goals can now be found in the care plan section) Progress towards PT goals: Progressing toward goals    Frequency  Min 3X/week    PT Plan      Co-evaluation             End of Session Equipment Utilized During Treatment: Gait belt Activity Tolerance: Patient limited by fatigue Patient left: with call bell/phone within reach     Time: 1002-1036 PT Time Calculation (min) (ACUTE ONLY): 34 min  Charges:  $Therapeutic Activity: 23-37 mins  G Codes:      Rica Koyanagi  PTA [facility]  Acute  Rehab Pager      [phone]

## 2014-07-04 NOTE — Progress Notes (Signed)
Patient is set to discharge to [facility] today. Patient & daughter, Nelia Shi aware. Discharge packet given to RN, Catie. [facility] called for transport to pickup at 3:30pm.    Raynaldo Opitz, [place] Worker cell #: [phone]

## 2014-07-04 NOTE — Progress Notes (Signed)
CRITICAL VALUE ALERT  Critical value received:  Tracheal Aspirate is MRSA +  Date of notification:  07/04/14  Time of notification:  0845  Critical value read back:Yes.    Nurse who received alert:  Catie  MD notified (1st page):  MD already aware- pt on Contact Precautions for MRSA  Time of first page:     MD notified (2nd page):  Time of second page:  Responding MD:     Time MD responded:

## 2014-07-04 NOTE — Progress Notes (Signed)
Called Report to [facility] and answered all questions. PICC line disconnected via IV team and left in place per MD verbal order. Pt will be transported via [facility] back to [facility].  Mercy Moore

## 2014-07-04 NOTE — Discharge Instructions (Signed)
Sepsis °Sepsis is a serious infection of your blood or tissues that affects your whole body. The infection that causes sepsis may be bacterial, viral, fungal, or parasitic. Sepsis may be life threatening. Sepsis can cause your blood pressure to drop. This may result in shock. Shock causes your central nervous system and your organs to stop working correctly.  °RISK FACTORS °Sepsis can happen in anyone, but it is more likely to happen in people who have weakened immune systems. °SIGNS AND SYMPTOMS  °Symptoms of sepsis can include: °· Fever or low body temperature (hypothermia). °· Rapid breathing (hyperventilation). °· Chills. °· Rapid heartbeat (tachycardia). °· Confusion or light-headedness. °· Trouble breathing. °· Urinating much less than usual. °· Cool, clammy skin or red, flushed skin. °· Other problems with the heart, kidneys, or brain. °DIAGNOSIS  °Your health care provider will likely do tests to look for an infection, to see if the infection has spread to your blood, and to see how serious your condition is. Tests can include: °· Blood tests, including cultures of your blood. °· Cultures of other fluids from your body, such as: °¨ Urine. °¨ Pus from wounds. °¨ Mucus coughed up from your lungs. °· Urine tests other than cultures. °· X-ray exams or other imaging tests. °TREATMENT  °Treatment will begin with elimination of the source of infection. If your sepsis is likely caused by a bacterial or fungal infection, you will be given antibiotic or antifungal medicines. °You may also receive: °· Oxygen. °· Fluids through an IV tube. °· Medicines to increase your blood pressure. °· A machine to clean your blood (dialysis) if your kidneys fail. °· A machine to help you breathe if your lungs fail. °SEEK IMMEDIATE MEDICAL CARE IF: °You get an infection or develop any of the signs and symptoms of sepsis after surgery or a hospitalization. °Document Released: 11/06/2002 Document Revised: 02/12/2013 Document Reviewed:  10/15/2012 °[employer]® Patient Information ©2015 [employer]. This information is not intended to replace advice given to you by your health care provider. Make sure you discuss any questions you have with your health care provider. ° °

## 2014-07-06 LAB — CULTURE, BLOOD (ROUTINE X 2): Culture: NO GROWTH

## 2014-07-09 ENCOUNTER — Emergency Department (HOSPITAL_COMMUNITY): Payer: Medicare Other

## 2014-07-09 ENCOUNTER — Other Ambulatory Visit (HOSPITAL_COMMUNITY): Payer: Self-pay

## 2014-07-09 ENCOUNTER — Inpatient Hospital Stay (HOSPITAL_COMMUNITY)
Admission: EM | Admit: 2014-07-09 | Discharge: 2014-07-14 | DRG: 871 | Disposition: A | Discharge status: Skilled Nursing Facility | Payer: Medicare Other | Attending: Internal Medicine | Admitting: Internal Medicine

## 2014-07-09 ENCOUNTER — Encounter (HOSPITAL_COMMUNITY): Payer: Self-pay | Admitting: Emergency Medicine

## 2014-07-09 DIAGNOSIS — Z882 Allergy status to sulfonamides status: Secondary | ICD-10-CM

## 2014-07-09 DIAGNOSIS — Z833 Family history of diabetes mellitus: Secondary | ICD-10-CM | POA: Diagnosis not present

## 2014-07-09 DIAGNOSIS — J9601 Acute respiratory failure with hypoxia: Secondary | ICD-10-CM | POA: Diagnosis not present

## 2014-07-09 DIAGNOSIS — L89150 Pressure ulcer of sacral region, unstageable: Secondary | ICD-10-CM | POA: Diagnosis present

## 2014-07-09 DIAGNOSIS — N390 Urinary tract infection, site not specified: Secondary | ICD-10-CM | POA: Diagnosis present

## 2014-07-09 DIAGNOSIS — G9340 Encephalopathy, unspecified: Secondary | ICD-10-CM | POA: Diagnosis not present

## 2014-07-09 DIAGNOSIS — Z66 Do not resuscitate: Secondary | ICD-10-CM | POA: Diagnosis not present

## 2014-07-09 DIAGNOSIS — E119 Type 2 diabetes mellitus without complications: Secondary | ICD-10-CM | POA: Diagnosis present

## 2014-07-09 DIAGNOSIS — R1310 Dysphagia, unspecified: Secondary | ICD-10-CM | POA: Diagnosis present

## 2014-07-09 DIAGNOSIS — L89152 Pressure ulcer of sacral region, stage 2: Secondary | ICD-10-CM | POA: Diagnosis present

## 2014-07-09 DIAGNOSIS — F0390 Unspecified dementia without behavioral disturbance: Secondary | ICD-10-CM | POA: Diagnosis present

## 2014-07-09 DIAGNOSIS — M1990 Unspecified osteoarthritis, unspecified site: Secondary | ICD-10-CM | POA: Diagnosis present

## 2014-07-09 DIAGNOSIS — F418 Other specified anxiety disorders: Secondary | ICD-10-CM | POA: Diagnosis not present

## 2014-07-09 DIAGNOSIS — E860 Dehydration: Secondary | ICD-10-CM | POA: Diagnosis present

## 2014-07-09 DIAGNOSIS — R278 Other lack of coordination: Secondary | ICD-10-CM | POA: Diagnosis not present

## 2014-07-09 DIAGNOSIS — R112 Nausea with vomiting, unspecified: Secondary | ICD-10-CM | POA: Diagnosis present

## 2014-07-09 DIAGNOSIS — Z7982 Long term (current) use of aspirin: Secondary | ICD-10-CM

## 2014-07-09 DIAGNOSIS — J690 Pneumonitis due to inhalation of food and vomit: Secondary | ICD-10-CM | POA: Diagnosis not present

## 2014-07-09 DIAGNOSIS — Z79899 Other long term (current) drug therapy: Secondary | ICD-10-CM | POA: Diagnosis not present

## 2014-07-09 DIAGNOSIS — M6281 Muscle weakness (generalized): Secondary | ICD-10-CM | POA: Diagnosis not present

## 2014-07-09 DIAGNOSIS — B9562 Methicillin resistant Staphylococcus aureus infection as the cause of diseases classified elsewhere: Secondary | ICD-10-CM | POA: Diagnosis present

## 2014-07-09 DIAGNOSIS — R1312 Dysphagia, oropharyngeal phase: Secondary | ICD-10-CM | POA: Diagnosis not present

## 2014-07-09 DIAGNOSIS — E11649 Type 2 diabetes mellitus with hypoglycemia without coma: Secondary | ICD-10-CM

## 2014-07-09 DIAGNOSIS — N179 Acute kidney failure, unspecified: Secondary | ICD-10-CM | POA: Diagnosis present

## 2014-07-09 DIAGNOSIS — R6521 Severe sepsis with septic shock: Secondary | ICD-10-CM | POA: Diagnosis present

## 2014-07-09 DIAGNOSIS — F419 Anxiety disorder, unspecified: Secondary | ICD-10-CM | POA: Diagnosis present

## 2014-07-09 DIAGNOSIS — Z993 Dependence on wheelchair: Secondary | ICD-10-CM | POA: Diagnosis not present

## 2014-07-09 DIAGNOSIS — R509 Fever, unspecified: Secondary | ICD-10-CM | POA: Diagnosis not present

## 2014-07-09 DIAGNOSIS — E559 Vitamin D deficiency, unspecified: Secondary | ICD-10-CM | POA: Diagnosis not present

## 2014-07-09 DIAGNOSIS — F329 Major depressive disorder, single episode, unspecified: Secondary | ICD-10-CM | POA: Diagnosis present

## 2014-07-09 DIAGNOSIS — E780 Pure hypercholesterolemia: Secondary | ICD-10-CM | POA: Diagnosis present

## 2014-07-09 DIAGNOSIS — R4182 Altered mental status, unspecified: Secondary | ICD-10-CM | POA: Diagnosis not present

## 2014-07-09 DIAGNOSIS — J302 Other seasonal allergic rhinitis: Secondary | ICD-10-CM | POA: Diagnosis not present

## 2014-07-09 DIAGNOSIS — J189 Pneumonia, unspecified organism: Secondary | ICD-10-CM

## 2014-07-09 DIAGNOSIS — Z8614 Personal history of Methicillin resistant Staphylococcus aureus infection: Secondary | ICD-10-CM | POA: Diagnosis not present

## 2014-07-09 DIAGNOSIS — A419 Sepsis, unspecified organism: Secondary | ICD-10-CM | POA: Diagnosis not present

## 2014-07-09 DIAGNOSIS — R4702 Dysphasia: Secondary | ICD-10-CM | POA: Diagnosis present

## 2014-07-09 DIAGNOSIS — Z885 Allergy status to narcotic agent status: Secondary | ICD-10-CM | POA: Diagnosis not present

## 2014-07-09 DIAGNOSIS — G2581 Restless legs syndrome: Secondary | ICD-10-CM | POA: Diagnosis present

## 2014-07-09 DIAGNOSIS — R2689 Other abnormalities of gait and mobility: Secondary | ICD-10-CM | POA: Diagnosis not present

## 2014-07-09 DIAGNOSIS — K3184 Gastroparesis: Secondary | ICD-10-CM | POA: Diagnosis not present

## 2014-07-09 DIAGNOSIS — Z515 Encounter for palliative care: Secondary | ICD-10-CM

## 2014-07-09 DIAGNOSIS — L8992 Pressure ulcer of unspecified site, stage 2: Secondary | ICD-10-CM | POA: Diagnosis present

## 2014-07-09 DIAGNOSIS — G9341 Metabolic encephalopathy: Secondary | ICD-10-CM | POA: Diagnosis present

## 2014-07-09 DIAGNOSIS — I10 Essential (primary) hypertension: Secondary | ICD-10-CM | POA: Diagnosis not present

## 2014-07-09 DIAGNOSIS — Z8701 Personal history of pneumonia (recurrent): Secondary | ICD-10-CM

## 2014-07-09 DIAGNOSIS — Z794 Long term (current) use of insulin: Secondary | ICD-10-CM

## 2014-07-09 DIAGNOSIS — L8990 Pressure ulcer of unspecified site, unspecified stage: Secondary | ICD-10-CM | POA: Diagnosis not present

## 2014-07-09 DIAGNOSIS — F068 Other specified mental disorders due to known physiological condition: Secondary | ICD-10-CM | POA: Diagnosis not present

## 2014-07-09 DIAGNOSIS — T17908A Unspecified foreign body in respiratory tract, part unspecified causing other injury, initial encounter: Secondary | ICD-10-CM

## 2014-07-09 DIAGNOSIS — R69 Illness, unspecified: Secondary | ICD-10-CM | POA: Diagnosis not present

## 2014-07-09 DIAGNOSIS — R339 Retention of urine, unspecified: Secondary | ICD-10-CM | POA: Diagnosis not present

## 2014-07-09 DIAGNOSIS — IMO0001 Reserved for inherently not codable concepts without codable children: Secondary | ICD-10-CM | POA: Insufficient documentation

## 2014-07-09 DIAGNOSIS — R05 Cough: Secondary | ICD-10-CM | POA: Diagnosis not present

## 2014-07-09 DIAGNOSIS — J9811 Atelectasis: Secondary | ICD-10-CM | POA: Diagnosis not present

## 2014-07-09 DIAGNOSIS — E1165 Type 2 diabetes mellitus with hyperglycemia: Secondary | ICD-10-CM | POA: Diagnosis not present

## 2014-07-09 DIAGNOSIS — R1314 Dysphagia, pharyngoesophageal phase: Secondary | ICD-10-CM | POA: Diagnosis not present

## 2014-07-09 LAB — COMPREHENSIVE METABOLIC PANEL
ALBUMIN: 2.9 g/dL — AB (ref 3.5–5.0)
ALT: 21 U/L (ref 14–54)
AST: 14 U/L — AB (ref 15–41)
Alkaline Phosphatase: 61 U/L (ref 38–126)
Anion gap: 12 (ref 5–15)
BILIRUBIN TOTAL: 1 mg/dL (ref 0.3–1.2)
BUN: 8 mg/dL (ref 6–20)
CALCIUM: 9.4 mg/dL (ref 8.9–10.3)
CO2: 26 mmol/L (ref 22–32)
Chloride: 100 mmol/L — ABNORMAL LOW (ref 101–111)
Creatinine, Ser: 0.6 mg/dL (ref 0.44–1.00)
GFR calc Af Amer: >60 mL/min (ref >60)
GFR calc non Af Amer: >60 mL/min (ref >60)
Glucose, Bld: 132 mg/dL — ABNORMAL HIGH (ref 65–99)
POTASSIUM: 3.8 mmol/L (ref 3.5–5.1)
SODIUM: 138 mmol/L (ref 135–145)
TOTAL PROTEIN: 7.3 g/dL (ref 6.5–8.1)

## 2014-07-09 LAB — CBC WITH DIFFERENTIAL/PLATELET
Basophils Absolute: 0 10*3/uL (ref 0.0–0.1)
Basophils Relative: 0 % (ref 0–1)
EOS ABS: 0 10*3/uL (ref 0.0–0.7)
EOS PCT: 0 % (ref 0–5)
HEMATOCRIT: 32.7 % — AB (ref 36.0–46.0)
HEMOGLOBIN: 10.6 g/dL — AB (ref 12.0–15.0)
LYMPHS ABS: 0.9 10*3/uL (ref 0.7–4.0)
Lymphocytes Relative: 7 % — ABNORMAL LOW (ref 12–46)
MCH: 26.8 pg (ref 26.0–34.0)
MCHC: 32.4 g/dL (ref 30.0–36.0)
MCV: 82.6 fL (ref 78.0–100.0)
Monocytes Absolute: 0.9 10*3/uL (ref 0.1–1.0)
Monocytes Relative: 7 % (ref 3–12)
Neutro Abs: 10.7 10*3/uL — ABNORMAL HIGH (ref 1.7–7.7)
Neutrophils Relative %: 86 % — ABNORMAL HIGH (ref 43–77)
Platelets: 267 10*3/uL (ref 150–400)
RBC: 3.96 MIL/uL (ref 3.87–5.11)
RDW: 14.6 % (ref 11.5–15.5)
WBC: 12.4 10*3/uL — AB (ref 4.0–10.5)

## 2014-07-09 LAB — CREATININE, SERUM: Creatinine, Ser: 0.66 mg/dL (ref 0.44–1.00)

## 2014-07-09 LAB — CBC
HCT: 29 % — ABNORMAL LOW (ref 36.0–46.0)
HEMOGLOBIN: 9.3 g/dL — AB (ref 12.0–15.0)
MCH: 26.4 pg (ref 26.0–34.0)
MCHC: 32.1 g/dL (ref 30.0–36.0)
MCV: 82.4 fL (ref 78.0–100.0)
PLATELETS: 214 10*3/uL (ref 150–400)
RBC: 3.52 MIL/uL — ABNORMAL LOW (ref 3.87–5.11)
RDW: 14.4 % (ref 11.5–15.5)
WBC: 10.5 10*3/uL (ref 4.0–10.5)

## 2014-07-09 LAB — I-STAT TROPONIN, ED: Troponin i, poc: 0.01 ng/mL (ref 0.00–0.08)

## 2014-07-09 LAB — URINALYSIS, ROUTINE W REFLEX MICROSCOPIC
BILIRUBIN URINE: NEGATIVE
Glucose, UA: 250 mg/dL — AB
HGB URINE DIPSTICK: NEGATIVE
Ketones, ur: NEGATIVE mg/dL
Leukocytes, UA: NEGATIVE
Nitrite: NEGATIVE
PROTEIN: NEGATIVE mg/dL
Specific Gravity, Urine: 1.008 (ref 1.005–1.030)
UROBILINOGEN UA: 0.2 mg/dL (ref 0.0–1.0)
pH: 7 (ref 5.0–8.0)

## 2014-07-09 LAB — GLUCOSE, CAPILLARY: Glucose-Capillary: 144 mg/dL — ABNORMAL HIGH (ref 65–99)

## 2014-07-09 LAB — LACTIC ACID, PLASMA: Lactic Acid, Venous: 0.9 mmol/L (ref 0.5–2.0)

## 2014-07-09 LAB — BRAIN NATRIURETIC PEPTIDE: B Natriuretic Peptide: 144.3 pg/mL — ABNORMAL HIGH (ref 0.0–100.0)

## 2014-07-09 MED ORDER — ACETAMINOPHEN 650 MG RE SUPP: 650.0000 mg | Freq: Four times a day (QID) | RECTAL | Status: DC | PRN

## 2014-07-09 MED ORDER — ONDANSETRON HCL 4 MG/2ML IJ SOLN: 4.0000 mg | Freq: Four times a day (QID) | INTRAMUSCULAR | Status: DC | PRN

## 2014-07-09 MED ORDER — LORAZEPAM 2 MG/ML IJ SOLN
0.5000 mg | Freq: Three times a day (TID) | INTRAMUSCULAR | Status: DC | PRN
  Administered 2014-07-11: 0.5 mg via INTRAVENOUS
  Filled 2014-07-09: qty 1

## 2014-07-09 MED ORDER — VANCOMYCIN HCL IN DEXTROSE 750-5 MG/150ML-% IV SOLN
750.0000 mg | Freq: Two times a day (BID) | INTRAVENOUS | Status: DC
  Administered 2014-07-10 – 2014-07-11 (×4): 750 mg via INTRAVENOUS
  Filled 2014-07-09 (×5): qty 150

## 2014-07-09 MED ORDER — ONDANSETRON HCL 4 MG PO TABS: 4.0000 mg | ORAL_TABLET | Freq: Four times a day (QID) | ORAL | Status: DC | PRN

## 2014-07-09 MED ORDER — ENOXAPARIN SODIUM 40 MG/0.4ML ~~LOC~~ SOLN
40.0000 mg | SUBCUTANEOUS | Status: DC
  Administered 2014-07-09 – 2014-07-13 (×5): 40 mg via SUBCUTANEOUS
  Filled 2014-07-09 (×6): qty 0.4

## 2014-07-09 MED ORDER — LABETALOL HCL 5 MG/ML IV SOLN
10.0000 mg | INTRAVENOUS | Status: DC | PRN
  Administered 2014-07-13: 10 mg via INTRAVENOUS
  Filled 2014-07-09 (×2): qty 4

## 2014-07-09 MED ORDER — MORPHINE SULFATE 2 MG/ML IJ SOLN: 2.0000 mg | INTRAMUSCULAR | Status: DC | PRN

## 2014-07-09 MED ORDER — FLEET ENEMA 7-19 GM/118ML RE ENEM: 1.0000 | ENEMA | Freq: Once | RECTAL | Status: AC | PRN

## 2014-07-09 MED ORDER — SODIUM CHLORIDE 0.9 % IV SOLN
INTRAVENOUS | Status: DC
  Administered 2014-07-09 – 2014-07-10 (×2): via INTRAVENOUS

## 2014-07-09 MED ORDER — SODIUM CHLORIDE 0.9 % IV BOLUS (SEPSIS)
500.0000 mL | Freq: Once | INTRAVENOUS | Status: AC
  Administered 2014-07-09: 500 mL via INTRAVENOUS

## 2014-07-09 MED ORDER — INSULIN DETEMIR 100 UNIT/ML ~~LOC~~ SOLN
5.0000 [IU] | Freq: Every day | SUBCUTANEOUS | Status: DC
  Administered 2014-07-09 – 2014-07-13 (×5): 5 [IU] via SUBCUTANEOUS
  Filled 2014-07-09 (×6): qty 0.05

## 2014-07-09 MED ORDER — ALBUTEROL SULFATE (2.5 MG/3ML) 0.083% IN NEBU: 2.5000 mg | INHALATION_SOLUTION | RESPIRATORY_TRACT | Status: DC | PRN

## 2014-07-09 MED ORDER — ACETAMINOPHEN 325 MG PO TABS: 650.0000 mg | ORAL_TABLET | Freq: Four times a day (QID) | ORAL | Status: DC | PRN

## 2014-07-09 MED ORDER — SODIUM CHLORIDE 0.9 % IJ SOLN
3.0000 mL | Freq: Two times a day (BID) | INTRAMUSCULAR | Status: DC
  Administered 2014-07-10 – 2014-07-13 (×3): 3 mL via INTRAVENOUS

## 2014-07-09 MED ORDER — PIPERACILLIN-TAZOBACTAM 3.375 G IVPB
3.3750 g | Freq: Three times a day (TID) | INTRAVENOUS | Status: DC
  Administered 2014-07-09 – 2014-07-13 (×13): 3.375 g via INTRAVENOUS
  Filled 2014-07-09 (×16): qty 50

## 2014-07-09 MED ORDER — INSULIN ASPART 100 UNIT/ML ~~LOC~~ SOLN
0.0000 [IU] | SUBCUTANEOUS | Status: DC
  Administered 2014-07-09: 1 [IU] via SUBCUTANEOUS
  Administered 2014-07-10: 2 [IU] via SUBCUTANEOUS
  Administered 2014-07-10 (×3): 1 [IU] via SUBCUTANEOUS
  Administered 2014-07-11 – 2014-07-12 (×5): 2 [IU] via SUBCUTANEOUS
  Administered 2014-07-12: 3 [IU] via SUBCUTANEOUS
  Administered 2014-07-13 (×3): 2 [IU] via SUBCUTANEOUS
  Administered 2014-07-13: 3 [IU] via SUBCUTANEOUS
  Administered 2014-07-13 (×2): 2 [IU] via SUBCUTANEOUS
  Administered 2014-07-14 (×4): 1 [IU] via SUBCUTANEOUS
  Administered 2014-07-14: 2 [IU] via SUBCUTANEOUS

## 2014-07-09 MED ORDER — BISACODYL 10 MG RE SUPP: 10.0000 mg | RECTAL | Status: DC | PRN

## 2014-07-09 NOTE — ED Provider Notes (Signed)
CSN: [id number]     Arrival date & time 07/09/14  1616 History   First MD Initiated Contact with Patient 07/09/14 1640     Chief Complaint  Patient presents with  . Nausea  . Emesis  . Aspiration     (Consider location/radiation/quality/duration/timing/severity/associated sxs/prior Treatment) HPI Comments: 74 year old female who presents with concern for aspiration. The daughter notes that she had a swallow evaluation yesterday which was concerning for this. The duration of her symptoms is constant. The severity is mild. The daughter notes that the patient does have chronic vomiting. She also notes a decline in the patient's mental status over the last 2-3 days. She denies any fevers or other associated symptoms. She has not had similar symptoms previously. Nothing has relieved her symptoms also far.  Patient is a 74 y.o. female presenting with vomiting. History provided by: daughter.  Emesis Associated symptoms: no abdominal pain, no diarrhea and no headaches     Past Medical History  Diagnosis Date  . Anxiety   . Dyspnea   . Diabetes mellitus   . Osteoarthritis   . Hypercholesteremia   . Depression   . RLS (restless legs syndrome)   . Stress   . PONV (postoperative nausea and vomiting)   . Headache(784.0)   . HTN (hypertension)     dr Johnsie Cancel   Past Surgical History  Procedure Laterality Date  . Colonoscopy    . Abdominal hysterectomy    . Lumbar laminectomy/decompression microdiscectomy N/A 04/18/2012    Procedure: LUMBAR LAMINECTOMY/DECOMPRESSION MICRODISCECTOMY 3 LEVELS;  Surgeon: Winfield Cunas, MD;  Location: [facility];  Service: Neurosurgery;  Laterality: N/A;  Lumbar three-four, lumbar four-five, lumbar five-sacral one decompression. Synovial cyst resection lumbar four-five  . Joint replacement    . Left forearm fracture with orif Left   . Replacement total knee      right   Family History  Problem Relation Age of Onset  . Diabetes Father   . High blood  pressure Father   . High blood pressure Mother    History  Substance Use Topics  . Smoking status: Never Smoker   . Smokeless tobacco: Never Used  . Alcohol Use: No   OB History    No data available     Review of Systems  Constitutional: Negative for fever and fatigue.  HENT: Negative for congestion and drooling.   Eyes: Negative for pain.  Respiratory: Positive for cough. Negative for shortness of breath.   Cardiovascular: Negative for chest pain.  Gastrointestinal: Negative for nausea, vomiting, abdominal pain and diarrhea.  Genitourinary: Negative for dysuria and hematuria.  Musculoskeletal: Negative for back pain, gait problem and neck pain.  Skin: Negative for color change.  Neurological: Negative for dizziness and headaches.  Hematological: Negative for adenopathy.  Psychiatric/Behavioral: Negative for behavioral problems.  All other systems reviewed and are negative.     Allergies  Morphine and related; Oxycodone; and Sulfonamide derivatives  Home Medications   Prior to Admission medications   Medication Sig Start Date End Date Taking? Authorizing Provider  acetaminophen (TYLENOL) 500 MG tablet Take 500 mg by mouth every 4 (four) hours as needed for mild pain, fever or headache (99.5 to 101).    Yes Historical Provider, MD  ALPRAZolam Duanne Moron) 0.5 MG tablet Take 1 tablet (0.5 mg total) by mouth 2 (two) times daily as needed for anxiety. 07/04/14  Yes Robbie Lis, MD  aspirin 81 MG chewable tablet Chew 81 mg by mouth daily.   Yes  Historical Provider, MD  B Complex Vitamins (B-COMPLEX/B-12 PO) Take 1 tablet by mouth daily.    Yes Historical Provider, MD  bisacodyl (DULCOLAX) 10 MG suppository Place 10 mg rectally as needed for moderate constipation.   Yes Historical Provider, MD  cholecalciferol (VITAMIN D) 1000 UNITS tablet Take 1,000 Units by mouth daily.   Yes Historical Provider, MD  Cranberry-Vitamin C-Probiotic (AZO CRANBERRY) 250-30 MG TABS Take 1 tablet by  mouth daily.   Yes Historical Provider, MD  insulin aspart (NOVOLOG) 100 UNIT/ML injection Inject 0-20 Units into the skin 3 (three) times daily with meals. Patient taking differently: Inject 0-20 Units into the skin 3 (three) times daily with meals. On sliding scale 07/04/14  Yes Robbie Lis, MD  insulin aspart (NOVOLOG) 100 UNIT/ML injection Inject 4 Units into the skin 3 (three) times daily with meals. 07/04/14  Yes Robbie Lis, MD  insulin detemir (LEVEMIR) 100 UNIT/ML injection Inject 0.1 mLs (10 Units total) into the skin at bedtime. 07/04/14  Yes Robbie Lis, MD  linezolid (ZYVOX) 600 MG tablet Take 1 tablet (600 mg total) by mouth every 12 (twelve) hours. Patient taking differently: Take 600 mg by mouth every 12 (twelve) hours. For 4 months 07/04/14  Yes Robbie Lis, MD  loperamide (IMODIUM) 2 MG capsule Take 2 mg by mouth as needed for diarrhea or loose stools. Do not exceed 8 doses in 24 hours   Yes Historical Provider, MD  loratadine (CLARITIN) 10 MG tablet Take 10 mg by mouth daily as needed for allergies.    Yes Historical Provider, MD  losartan (COZAAR) 100 MG tablet Take 100 mg by mouth daily.   Yes Historical Provider, MD  Magnesium Oxide 500 MG TABS Take 500 mg by mouth daily.    Yes Historical Provider, MD  metoprolol succinate (TOPROL-XL) 50 MG 24 hr tablet Take 1 tablet (50 mg total) by mouth daily. 07/04/14  Yes Robbie Lis, MD  mirtazapine (REMERON) 7.5 MG tablet Take 1 tablet (7.5 mg total) by mouth at bedtime. 07/04/14  Yes Robbie Lis, MD  omeprazole (PRILOSEC) 40 MG capsule Take 40 mg by mouth daily.   Yes Historical Provider, MD  ondansetron (ZOFRAN) 4 MG tablet Take 4 mg by mouth every 8 (eight) hours as needed for nausea or vomiting.    Yes Historical Provider, MD  polyethylene glycol (MIRALAX / GLYCOLAX) packet Take 17 g by mouth daily. 07/26/13  Yes [name], MD  potassium chloride SA (K-DUR,KLOR-CON) 20 MEQ tablet Take 40 mEq by mouth daily.    Yes  Historical Provider, MD  pramipexole (MIRAPEX) 0.125 MG tablet Take 3 tablets (0.375 mg total) by mouth 3 (three) times daily. 07/04/14  Yes Robbie Lis, MD  senna-docusate (SENOKOT-S) 8.6-50 MG per tablet Take 1 tablet by mouth at bedtime as needed for mild constipation. 07/26/13  Yes [name], MD  feeding supplement, ENSURE ENLIVE, (ENSURE ENLIVE) LIQD Take 237 mLs by mouth 2 (two) times daily between meals. Patient not taking: Reported on 07/09/2014 07/04/14   Robbie Lis, MD   BP 140/92 mmHg  Pulse 101  Temp(Src) 98.5 F (36.9 C) (Oral)  Resp 20  SpO2 96% Physical Exam  Constitutional: She appears well-developed and well-nourished.  HENT:  Head: Normocephalic.  Mouth/Throat: Oropharynx is clear and moist. No oropharyngeal exudate.  Eyes: Conjunctivae and EOM are normal. Pupils are equal, round, and reactive to light.  Neck: Normal range of motion. Neck supple.  Cardiovascular: Normal rate, regular  rhythm, normal heart sounds and intact distal pulses.  Exam reveals no gallop and no friction rub.   No murmur heard. Pulmonary/Chest: Effort normal. No respiratory distress. She has no wheezes.  Mildly rhonchorous breath sounds bilaterally.  Abdominal: Soft. Bowel sounds are normal. There is no tenderness. There is no rebound and no guarding.  Genitourinary: Vagina normal.  Mild erythema in the inguinal creases.  A sacral decubitus ulcer is noted.  Musculoskeletal: Normal range of motion. She exhibits no edema or tenderness.  Neurological: She is alert.  Will only answer some questions. Able to move all her extremities without obvious deficit.  Skin: Skin is warm and dry.  Nursing note and vitals reviewed.   ED Course  Procedures (including critical care time) Labs Review Labs Reviewed  CBC WITH DIFFERENTIAL/PLATELET - Abnormal; Notable for the following:    WBC 12.4 (*)    Hemoglobin 10.6 (*)    HCT 32.7 (*)    Neutrophils Relative % 86 (*)    Neutro Abs 10.7 (*)     Lymphocytes Relative 7 (*)    All other components within normal limits  COMPREHENSIVE METABOLIC PANEL - Abnormal; Notable for the following:    Chloride 100 (*)    Glucose, Bld 132 (*)    Albumin 2.9 (*)    AST 14 (*)    All other components within normal limits  BRAIN NATRIURETIC PEPTIDE - Abnormal; Notable for the following:    B Natriuretic Peptide 144.3 (*)    All other components within normal limits  URINALYSIS, ROUTINE W REFLEX MICROSCOPIC - Abnormal; Notable for the following:    APPearance CLOUDY (*)    Glucose, UA 250 (*)    All other components within normal limits  CBC - Abnormal; Notable for the following:    RBC 3.52 (*)    Hemoglobin 9.3 (*)    HCT 29.0 (*)    All other components within normal limits  GLUCOSE, CAPILLARY - Abnormal; Notable for the following:    Glucose-Capillary 144 (*)    All other components within normal limits  URINE CULTURE  CULTURE, BLOOD (ROUTINE X 2)  CULTURE, BLOOD (ROUTINE X 2)  URINE CULTURE  CULTURE, EXPECTORATED SPUTUM-ASSESSMENT  LACTIC ACID, PLASMA  CREATININE, SERUM  MAGNESIUM  PHOSPHORUS  TSH  COMPREHENSIVE METABOLIC PANEL  CBC  HEMOGLOBIN A1C  I-STAT TROPOININ, ED    Imaging Review Dg Chest 2 View  07/09/2014   CLINICAL DATA:  Productive cough, possible aspiration 4 days ago, diabetes, hypertension  EXAM: CHEST  2 VIEW  COMPARISON:  07/02/2014  FINDINGS: RIGHT arm PICC line tip projects over SVC.  Normal heart size, mediastinal contours and pulmonary vascularity.  Atherosclerotic calcification aorta.  Minimal RIGHT basilar atelectasis.  Lungs otherwise clear.  Improved aeration versus previous study.  No pleural effusion or pneumothorax.  IMPRESSION: Improved aeration versus prior exam.   Electronically Signed   By: Lavonia Dana M.D.   On: 07/09/2014 18:04   Ct Head Wo Contrast  07/09/2014   CLINICAL DATA:  Altered mental status  EXAM: CT HEAD WITHOUT CONTRAST  TECHNIQUE: Contiguous axial images were obtained from the base  of the skull through the vertex without intravenous contrast.  COMPARISON:  05/20/2014  FINDINGS: Bony calvarium is intact. No gross soft tissue abnormality is seen. There is been interval improvement in the degree of opacification within the left maxillary antrum. Diffuse atrophic changes are noted. Mild chronic white matter ischemic change is seen. No findings to suggest acute hemorrhage,  acute infarction or space-occupying mass lesion are noted.  IMPRESSION: Chronic atrophic in ischemic changes without acute abnormality.   Electronically Signed   By: Inez Catalina M.D.   On: 07/09/2014 17:57     EKG Interpretation None      MDM   Final diagnoses:  Aspiration into airway, initial encounter    5:15 PM 74 y.o. female w hx of recent admission for septic shock and respiratory failure who presents with concern for aspiration. The daughter notes that the patient was discharged 5 days ago. She did well for the first 2 days but 3 days ago began having a slowly declining mental status appearing more drowsy than usual and being less responsive. She notes that the patient had a swallow eval yesterday which was concerning for aspiration. The daughter states that the patient's GI doctor was notified who recommended she come to the hospital. I do not have the results of the study. The patient is afebrile and vital signs are unremarkable here. She will only follow some commands on exam. We'll get screening labs and imaging and GI.  Discussed case w/ Dr. Benson Norway (GI). Will admit to hospitalist. GI to eval tomorrow.   Pamella Pert, MD 07/09/14 [id number]

## 2014-07-09 NOTE — ED Notes (Signed)
Patient transported to CT 

## 2014-07-09 NOTE — H&P (Signed)
PCP: [name], MD  Hoyt Koch  Referring provider Aline Brochure   Chief Complaint: Was instructed to present to the emergency department by Dr. Collene Mares for aspiration  HPI: [name] is a 74 y.o. female   has a past medical history of Anxiety; Dyspnea; Diabetes mellitus; Osteoarthritis; Hypercholesteremia; Depression; RLS (restless legs syndrome); Stress; PONV (postoperative nausea and vomiting); Headache(784.0); and HTN (hypertension).   Patient had a recent hospitalization complicated by severe sepsis due to UTI resulting in encephalopathy and acute renal failure as well as acute respiratory failure requiring intubation. she was discharged to nursing facility on May 13 of note patient has persistent MRSA UTI. Recently she has been treated with vancomycin in the past and at the time of discharge was treated with Zyvox 600 mg twice a day she completed the course on 17th. She was discharged to nursing facility with PICC line in place. Daughter states that while in at the nursing facility. Patient have had a swollen of all done. Swallow evaluation showed evidence of significant aspiration. Family states that they were told that all the food that is swallowed closed directly into the lungs. At nursing home facility they made nothing by mouth since yesterday. Per family she was not having history of coughing and shortness of breath with fever since her discharge. Her GI doctor has been called and recommended transfer to emergency department. ER physician spoke to Dr. Benson Norway who is on-call who states that GI will consult and patient may need endoscopy to clarify significant dysphagia. In emergency department CT scan of the head showed no acute CVA, chest x-ray apparently also showing improved aeration no evidence of aspiration pneumonia.  In the emergency department she was noted to be febrile up to 101. Blood cultures were drawn and patient was empirically started on vancomycin and Zosyn given  recent admission for severe sepsis. Per daughter if there's no improvement of aspiration they would be interested in feeding tube placement.   Of note patient has chronic stage II sacral decubitus ulcer treated by soft silicone foam   Hospitalist was called for admission for aspiration  Review of Systems:  Unable to obtained due to encephalopathy Past Medical History: Past Medical History  Diagnosis Date  . Anxiety   . Dyspnea   . Diabetes mellitus   . Osteoarthritis   . Hypercholesteremia   . Depression   . RLS (restless legs syndrome)   . Stress   . PONV (postoperative nausea and vomiting)   . Headache(784.0)   . HTN (hypertension)     dr Johnsie Cancel   Past Surgical History  Procedure Laterality Date  . Colonoscopy    . Abdominal hysterectomy    . Lumbar laminectomy/decompression microdiscectomy N/A 04/18/2012    Procedure: LUMBAR LAMINECTOMY/DECOMPRESSION MICRODISCECTOMY 3 LEVELS;  Surgeon: Winfield Cunas, MD;  Location: [place] [facility];  Service: Neurosurgery;  Laterality: N/A;  Lumbar three-four, lumbar four-five, lumbar five-sacral one decompression. Synovial cyst resection lumbar four-five  . Joint replacement    . Left forearm fracture with orif Left   . Replacement total knee      right     Medications: Prior to Admission medications   Medication Sig Start Date End Date Taking? Authorizing Provider  acetaminophen (TYLENOL) 500 MG tablet Take 500 mg by mouth every 4 (four) hours as needed for mild pain, fever or headache (99.5 to 101).    Yes Historical Provider, MD  ALPRAZolam Duanne Moron) 0.5 MG tablet Take 1 tablet (0.5 mg total) by mouth  2 (two) times daily as needed for anxiety. 07/04/14  Yes Robbie Lis, MD  aspirin 81 MG chewable tablet Chew 81 mg by mouth daily.   Yes Historical Provider, MD  B Complex Vitamins (B-COMPLEX/B-12 PO) Take 1 tablet by mouth daily.    Yes Historical Provider, MD  bisacodyl (DULCOLAX) 10 MG suppository Place 10 mg rectally as needed for  moderate constipation.   Yes Historical Provider, MD  cholecalciferol (VITAMIN D) 1000 UNITS tablet Take 1,000 Units by mouth daily.   Yes Historical Provider, MD  Cranberry-Vitamin C-Probiotic (AZO CRANBERRY) 250-30 MG TABS Take 1 tablet by mouth daily.   Yes Historical Provider, MD  insulin aspart (NOVOLOG) 100 UNIT/ML injection Inject 0-20 Units into the skin 3 (three) times daily with meals. Patient taking differently: Inject 0-20 Units into the skin 3 (three) times daily with meals. On sliding scale 07/04/14  Yes Robbie Lis, MD  insulin aspart (NOVOLOG) 100 UNIT/ML injection Inject 4 Units into the skin 3 (three) times daily with meals. 07/04/14  Yes Robbie Lis, MD  insulin detemir (LEVEMIR) 100 UNIT/ML injection Inject 0.1 mLs (10 Units total) into the skin at bedtime. 07/04/14  Yes Robbie Lis, MD  linezolid (ZYVOX) 600 MG tablet Take 1 tablet (600 mg total) by mouth every 12 (twelve) hours. Patient taking differently: Take 600 mg by mouth every 12 (twelve) hours. For 4 months 07/04/14  Yes Robbie Lis, MD  loperamide (IMODIUM) 2 MG capsule Take 2 mg by mouth as needed for diarrhea or loose stools. Do not exceed 8 doses in 24 hours   Yes Historical Provider, MD  loratadine (CLARITIN) 10 MG tablet Take 10 mg by mouth daily as needed for allergies.    Yes Historical Provider, MD  losartan (COZAAR) 100 MG tablet Take 100 mg by mouth daily.   Yes Historical Provider, MD  Magnesium Oxide 500 MG TABS Take 500 mg by mouth daily.    Yes Historical Provider, MD  metoprolol succinate (TOPROL-XL) 50 MG 24 hr tablet Take 1 tablet (50 mg total) by mouth daily. 07/04/14  Yes Robbie Lis, MD  mirtazapine (REMERON) 7.5 MG tablet Take 1 tablet (7.5 mg total) by mouth at bedtime. 07/04/14  Yes Robbie Lis, MD  omeprazole (PRILOSEC) 40 MG capsule Take 40 mg by mouth daily.   Yes Historical Provider, MD  ondansetron (ZOFRAN) 4 MG tablet Take 4 mg by mouth every 8 (eight) hours as needed for nausea or  vomiting.    Yes Historical Provider, MD  polyethylene glycol (MIRALAX / GLYCOLAX) packet Take 17 g by mouth daily. 07/26/13  Yes [name], MD  potassium chloride SA (K-DUR,KLOR-CON) 20 MEQ tablet Take 40 mEq by mouth daily.    Yes Historical Provider, MD  pramipexole (MIRAPEX) 0.125 MG tablet Take 3 tablets (0.375 mg total) by mouth 3 (three) times daily. 07/04/14  Yes Robbie Lis, MD  senna-docusate (SENOKOT-S) 8.6-50 MG per tablet Take 1 tablet by mouth at bedtime as needed for mild constipation. 07/26/13  Yes [name], MD  feeding supplement, ENSURE ENLIVE, (ENSURE ENLIVE) LIQD Take 237 mLs by mouth 2 (two) times daily between meals. Patient not taking: Reported on 07/09/2014 07/04/14   Robbie Lis, MD    Allergies:   Allergies  Allergen Reactions  . Morphine And Related Other (See Comments)    hallucination   . Oxycodone Other (See Comments)    hallucination   . Sulfonamide Derivatives Swelling    Social  History:  Ambulatory  wheelchair bound    From facility [facility]   reports that she has never smoked. She has never used smokeless tobacco. She reports that she does not drink alcohol or use illicit drugs.    Family History: family history includes Diabetes in her father; High blood pressure in her father and mother.    Physical Exam: Patient Vitals for the past 24 hrs:  BP Temp Temp src Pulse Resp SpO2  07/09/14 1919 147/92 mmHg - - 101 (!) 28 96 %  07/09/14 1848 - - - 106 22 96 %  07/09/14 1824 - - - 102 24 96 %  07/09/14 1617 140/92 mmHg 98.5 F (36.9 C) Oral 101 20 96 %    1. General:  in No Acute distress 2. Psychological: somnolent not Oriented 3. Head/ENT:     Dry Mucous Membranes                          Head Non traumatic, neck supple                          Norma  Dentition 4. SKIN:  decreased Skin turgor,  Skin clean Dry and intact no rash, sacral decubitus noted which appeared to be well healing 5. Heart: Regular rate and  rhythm no Murmur, Rub or gallop 6. Lungs:   , no wheezes or crackles  overall decreased effort 7. Abdomen: Soft, non-tender, Non distended 8. Lower extremities: no clubbing, cyanosis, or edema 9. Neurologically Grossly intact, moving all 4 extremities equally 10. MSK: Normal range of motion  body mass index is unknown because there is no weight on file.   Labs on Admission:   Results for orders placed or performed during the hospital encounter of 07/09/14 (from the past 24 hour(s))  Brain natriuretic peptide     Status: Abnormal   Collection Time: 07/09/14  2:34 PM  Result Value Ref Range   B Natriuretic Peptide 144.3 (H) 0.0 - 100.0 pg/mL  CBC with Differential/Platelet     Status: Abnormal   Collection Time: 07/09/14  5:34 PM  Result Value Ref Range   WBC 12.4 (H) 4.0 - 10.5 K/uL   RBC 3.96 3.87 - 5.11 MIL/uL   Hemoglobin 10.6 (L) 12.0 - 15.0 g/dL   HCT 32.7 (L) 36.0 - 46.0 %   MCV 82.6 78.0 - 100.0 fL   MCH 26.8 26.0 - 34.0 pg   MCHC 32.4 30.0 - 36.0 g/dL   RDW 14.6 11.5 - 15.5 %   Platelets 267 150 - 400 K/uL   Neutrophils Relative % 86 (H) 43 - 77 %   Neutro Abs 10.7 (H) 1.7 - 7.7 K/uL   Lymphocytes Relative 7 (L) 12 - 46 %   Lymphs Abs 0.9 0.7 - 4.0 K/uL   Monocytes Relative 7 3 - 12 %   Monocytes Absolute 0.9 0.1 - 1.0 K/uL   Eosinophils Relative 0 0 - 5 %   Eosinophils Absolute 0.0 0.0 - 0.7 K/uL   Basophils Relative 0 0 - 1 %   Basophils Absolute 0.0 0.0 - 0.1 K/uL  Comprehensive metabolic panel     Status: Abnormal   Collection Time: 07/09/14  5:34 PM  Result Value Ref Range   Sodium 138 135 - 145 mmol/L   Potassium 3.8 3.5 - 5.1 mmol/L   Chloride 100 (L) 101 - 111 mmol/L   CO2 26  22 - 32 mmol/L   Glucose, Bld 132 (H) 65 - 99 mg/dL   BUN 8 6 - 20 mg/dL   Creatinine, Ser 0.60 0.44 - 1.00 mg/dL   Calcium 9.4 8.9 - 10.3 mg/dL   Total Protein 7.3 6.5 - 8.1 g/dL   Albumin 2.9 (L) 3.5 - 5.0 g/dL   AST 14 (L) 15 - 41 U/L   ALT 21 14 - 54 U/L   Alkaline  Phosphatase 61 38 - 126 U/L   Total Bilirubin 1.0 0.3 - 1.2 mg/dL   GFR calc non Af Amer >60 >60 mL/min   GFR calc Af Amer >60 >60 mL/min   Anion gap 12 5 - 15  I-stat troponin, ED     Status: None   Collection Time: 07/09/14  5:39 PM  Result Value Ref Range   Troponin i, poc 0.01 0.00 - 0.08 ng/mL   Comment 3          Urinalysis, Routine w reflex microscopic     Status: Abnormal   Collection Time: 07/09/14  6:50 PM  Result Value Ref Range   Color, Urine YELLOW YELLOW   APPearance CLOUDY (A) CLEAR   Specific Gravity, Urine 1.008 1.005 - 1.030   pH 7.0 5.0 - 8.0   Glucose, UA 250 (A) NEGATIVE mg/dL   Hgb urine dipstick NEGATIVE NEGATIVE   Bilirubin Urine NEGATIVE NEGATIVE   Ketones, ur NEGATIVE NEGATIVE mg/dL   Protein, ur NEGATIVE NEGATIVE mg/dL   Urobilinogen, UA 0.2 0.0 - 1.0 mg/dL   Nitrite NEGATIVE NEGATIVE   Leukocytes, UA NEGATIVE NEGATIVE    UA urine cloudy  Lab Results  Component Value Date   HGBA1C 8.1* 05/29/2014    Estimated Creatinine Clearance: 65.5 mL/min (by C-G formula based on Cr of 0.6).  BNP (last 3 results) No results for input(s): PROBNP in the last 8760 hours.  Other results:  I have pearsonaly reviewed this: ECG REPORT  Rate: 102  Rhythm: Sinus tachycardia ST&T Change: no ischemic changes   There were no vitals filed for this visit.   Cultures:    Component Value Date/Time   SDES TRACHEAL ASPIRATE 06/30/2014 1117   SPECREQUEST NONE 06/30/2014 1117   CULT  06/30/2014 1117    ABUNDANT METHICILLIN RESISTANT STAPHYLOCOCCUS AUREUS Note: RIFAMPIN AND GENTAMICIN SHOULD NOT BE USED AS SINGLE DRUGS FOR TREATMENT OF STAPH INFECTIONS. MODERATE CANDIDA ALBICANS Performed at [facility] 07/04/2014 FINAL 06/30/2014 1117     Radiological Exams on Admission: Dg Chest 2 View  07/09/2014   CLINICAL DATA:  Productive cough, possible aspiration 4 days ago, diabetes, hypertension  EXAM: CHEST  2 VIEW  COMPARISON:  07/02/2014   FINDINGS: RIGHT arm PICC line tip projects over SVC.  Normal heart size, mediastinal contours and pulmonary vascularity.  Atherosclerotic calcification aorta.  Minimal RIGHT basilar atelectasis.  Lungs otherwise clear.  Improved aeration versus previous study.  No pleural effusion or pneumothorax.  IMPRESSION: Improved aeration versus prior exam.   Electronically Signed   By: Lavonia Dana M.D.   On: 07/09/2014 18:04   Ct Head Wo Contrast  07/09/2014   CLINICAL DATA:  Altered mental status  EXAM: CT HEAD WITHOUT CONTRAST  TECHNIQUE: Contiguous axial images were obtained from the base of the skull through the vertex without intravenous contrast.  COMPARISON:  05/20/2014  FINDINGS: Bony calvarium is intact. No gross soft tissue abnormality is seen. There is been interval improvement in the degree of opacification within the left  maxillary antrum. Diffuse atrophic changes are noted. Mild chronic white matter ischemic change is seen. No findings to suggest acute hemorrhage, acute infarction or space-occupying mass lesion are noted.  IMPRESSION: Chronic atrophic in ischemic changes without acute abnormality.   Electronically Signed   By: Inez Catalina M.D.   On: 07/09/2014 17:57    Chart has been reviewed  Family not at  Bedside  plan of care was discussed with  Daughter, [name] [phone]  Assessment/Plan  74 year old female with history of MRSA UTI, dementia, recent admission for severe sepsis requiring intubation presents with newly found dysphasia at high risk of aspiration. Was found to meet sepsis criteria in emergency department  Present on Admission:  Sepsis - patient was febrile and emergency department evaluated with a blood cell count and evidence of tachycardia. We will obtain blood cultures lactic acid initiate antibiotics. At this point blood pressure stable. Admit to telemetry and continue to monitor with continuous pulse ox overnight 12 hours chest x-ray did not show evidence  of pneumonia. The patient is at risk of aspiration. We'll cover broadly with vancomycin and Zosyn  Aspiration GI is aware. No CP and in the morning. CT scan of the head showed no acute stroke. Patient with prior history of pneumonia. Possibly has been at risk for aspiration for quite some time. If there is no correctable cause family wishes to have a feeding tube placed  . Acute encephalopathy in a setting of dementia and likely sepsis. Hopefully will improve with resolution of sepsis  . Essential hypertension - she is on nothing by mouth due to risk of aspiration. Given new diagnosis of sepsis will hold off on scheduled antihypertensives continue when necessary labetalol. If patient's blood pressure remains stable and she remains to be nothing by mouth consider restarting IV metoprolol  . Dementia chronic expect sundowning  . Decubitus ulcer wrote for wound care consult   Prophylaxis:  Lovenox   CODE STATUS:  FULL CODE  as per  family  Disposition:    Back to current facility when stable  To home once workup is complete and patient is stable  Other plan as per orders.  I have spent a total of 55 min on this admission  Jenipher Havel 07/09/2014, 8:27 PM  [facility]  Pager [phone]   after 2 AM please page floor coverage PA If 7AM-7PM, please contact the day team taking care of the patient  [email or web address]  Password [email or web address]

## 2014-07-09 NOTE — ED Notes (Signed)
Per EMS: pt from [facility], states pt may have aspirated over the past 4 days. Pt having n/v and has been NPO since yesterday. Pt has PICC line in right arm.

## 2014-07-09 NOTE — Progress Notes (Signed)
ANTIBIOTIC CONSULT NOTE - INITIAL  Pharmacy Consult for vancomycin Indication: rule out sepsis  Allergies  Allergen Reactions  . Morphine And Related Other (See Comments)    hallucination   . Oxycodone Other (See Comments)    hallucination   . Sulfonamide Derivatives Swelling    Patient Measurements: weight 78 kg, height 67 inches    Vital Signs: Temp: 98.5 F (36.9 C) (05/18 1617) Temp Source: Oral (05/18 1617) BP: 133/86 mmHg (05/18 2130) Pulse Rate: 101 (05/18 2130) Intake/Output from previous day:   Intake/Output from this shift:    Labs:  Recent Labs  07/09/14 1734  WBC 12.4*  HGB 10.6*  PLT 267  CREATININE 0.60   Estimated Creatinine Clearance: 65.5 mL/min (by C-G formula based on Cr of 0.6). No results for input(s): VANCOTROUGH, VANCOPEAK, VANCORANDOM, GENTTROUGH, GENTPEAK, GENTRANDOM, TOBRATROUGH, TOBRAPEAK, TOBRARND, AMIKACINPEAK, AMIKACINTROU, AMIKACIN in the last 72 hours.   Microbiology: Recent Results (from the past 720 hour(s))  Culture, respiratory (NON-Expectorated)     Status: None   Collection Time: 06/30/14 12:03 AM  Result Value Ref Range Status   Specimen Description SPUTUM  Final   Special Requests NONE  Final   Gram Stain   Final    ABUNDANT WBC PRESENT,BOTH PMN AND MONONUCLEAR ABUNDANT SQUAMOUS EPITHELIAL CELLS PRESENT ABUNDANT GRAM POSITIVE COCCI IN PAIRS IN CHAINS ABUNDANT GRAM POSITIVE RODS Gram Stain Report Called to,Read Back By and Verified With: Gram Stain Report Called to,Read Back By and Verified With: [email or web address] AT 10:48 ON [id number] BY WEBSP    Culture   Final    ABUNDANT METHICILLIN RESISTANT STAPHYLOCOCCUS AUREUS Note: RIFAMPIN AND GENTAMICIN SHOULD NOT BE USED AS SINGLE DRUGS FOR TREATMENT OF STAPH INFECTIONS. Performed at [facility]    Report Status 07/03/2014 FINAL  Final   Organism ID, Bacteria METHICILLIN RESISTANT STAPHYLOCOCCUS AUREUS  Final      Susceptibility   Methicillin resistant staphylococcus aureus  - MIC*    CLINDAMYCIN >=8 RESISTANT Resistant     ERYTHROMYCIN >=8 RESISTANT Resistant     GENTAMICIN <=0.5 SENSITIVE Sensitive     LEVOFLOXACIN >=8 RESISTANT Resistant     OXACILLIN >=4 RESISTANT Resistant     PENICILLIN >=0.5 RESISTANT Resistant     RIFAMPIN <=0.5 SENSITIVE Sensitive     TRIMETH/SULFA <=10 SENSITIVE Sensitive     VANCOMYCIN 1 SENSITIVE Sensitive     TETRACYCLINE <=1 SENSITIVE Sensitive     * ABUNDANT METHICILLIN RESISTANT STAPHYLOCOCCUS AUREUS  Culture, blood (routine x 2)     Status: None   Collection Time: 06/30/14 12:10 AM  Result Value Ref Range Status   Specimen Description BLOOD LAC  Final   Special Requests BOTTLES DRAWN AEROBIC AND ANAEROBIC 5ML  Final   Culture   Final    STAPHYLOCOCCUS SPECIES (COAGULASE NEGATIVE) Note: THE SIGNIFICANCE OF ISOLATING THIS ORGANISM FROM A SINGLE SET OF BLOOD CULTURES WHEN MULTIPLE SETS ARE DRAWN IS UNCERTAIN. PLEASE NOTIFY THE MICROBIOLOGY DEPARTMENT WITHIN ONE WEEK IF SPECIATION AND SENSITIVITIES ARE REQUIRED. Note: Gram Stain Report Called to,Read Back By and Verified With: [facility] AT 6:41 A.M. ON 07/01/2014 WARRB Performed at [facility]    Report Status 07/02/2014 FINAL  Final  Culture, blood (routine x 2)     Status: None   Collection Time: 06/30/14 12:20 AM  Result Value Ref Range Status   Specimen Description BLOOD RIGHT HAND  Final   Special Requests BOTTLES DRAWN AEROBIC AND ANAEROBIC 5ML  Final   Culture   Final  NO GROWTH 5 DAYS Performed at [facility]    Report Status 07/06/2014 FINAL  Final  MRSA PCR Screening     Status: Abnormal   Collection Time: 06/30/14  5:30 AM  Result Value Ref Range Status   MRSA by PCR POSITIVE (A) NEGATIVE Final    Comment:        The GeneXpert MRSA Assay (FDA approved for NASAL specimens only), is one component of a comprehensive MRSA colonization surveillance program. It is not intended to diagnose MRSA infection nor to guide or monitor  treatment for MRSA infections. RESULT CALLED TO, READ BACK BY AND VERIFIED WITH: [place] RN AT 0705 ON 05.09.16 BY SHUEA   Urine culture     Status: None   Collection Time: 06/30/14  6:02 AM  Result Value Ref Range Status   Specimen Description URINE, CATHETERIZED  Final   Special Requests NONE  Final   Colony Count   Final    75,000 COLONIES/ML Performed at [facility]    Culture YEAST Performed at [facility]   Final   Report Status 07/01/2014 FINAL  Final  Culture, respiratory (tracheal aspirate)     Status: None   Collection Time: 06/30/14 11:17 AM  Result Value Ref Range Status   Specimen Description TRACHEAL ASPIRATE  Final   Special Requests NONE  Final   Gram Stain   Final    ABUNDANT WBC PRESENT, PREDOMINANTLY PMN RARE SQUAMOUS EPITHELIAL CELLS PRESENT FEW GRAM POSITIVE COCCI IN PAIRS FEW GRAM VARIABLE ROD Gram Stain Report Called to,Read Back By and Verified With: Gram Stain Report Called to,Read Back By and Verified With: KATIE.V AT [place] ON [id number] BY WEBSP    Culture   Final    ABUNDANT METHICILLIN RESISTANT STAPHYLOCOCCUS AUREUS Note: RIFAMPIN AND GENTAMICIN SHOULD NOT BE USED AS SINGLE DRUGS FOR TREATMENT OF STAPH INFECTIONS. MODERATE CANDIDA ALBICANS Performed at [facility]    Report Status 07/04/2014 FINAL  Final   Organism ID, Bacteria METHICILLIN RESISTANT STAPHYLOCOCCUS AUREUS  Final      Susceptibility   Methicillin resistant staphylococcus aureus - MIC*    CLINDAMYCIN >=8 RESISTANT Resistant     ERYTHROMYCIN >=8 RESISTANT Resistant     GENTAMICIN <=0.5 SENSITIVE Sensitive     LEVOFLOXACIN >=8 RESISTANT Resistant     OXACILLIN >=4 RESISTANT Resistant     PENICILLIN >=0.5 RESISTANT Resistant     RIFAMPIN <=0.5 SENSITIVE Sensitive     TRIMETH/SULFA <=10 SENSITIVE Sensitive     VANCOMYCIN <=0.5 SENSITIVE Sensitive     TETRACYCLINE <=1 SENSITIVE Sensitive     * ABUNDANT METHICILLIN RESISTANT STAPHYLOCOCCUS AUREUS   Clostridium Difficile by PCR     Status: None   Collection Time: 07/02/14 11:07 AM  Result Value Ref Range Status   C difficile by pcr NEGATIVE NEGATIVE Final    Medical History: Past Medical History  Diagnosis Date  . Anxiety   . Dyspnea   . Diabetes mellitus   . Osteoarthritis   . Hypercholesteremia   . Depression   . RLS (restless legs syndrome)   . Stress   . PONV (postoperative nausea and vomiting)   . Headache(784.0)   . HTN (hypertension)     dr Johnsie Cancel    Assessment: Patient is a 74 y.o F transferred to [facility] after the staff at [facility] suspected patient aspirated at the facility.  To start broad abx with vancomycin and zosyn for suspected sepsis.  Goal of Therapy:  Vancomycin trough level 15-20  mcg/ml  Plan:  - vancomycin 750 gm IV q12h - f/u renal function, clinical status, cultures  Jakai Risse P 07/09/2014,10:03 PM

## 2014-07-10 ENCOUNTER — Inpatient Hospital Stay (HOSPITAL_COMMUNITY): Payer: Medicare Other

## 2014-07-10 DIAGNOSIS — J690 Pneumonitis due to inhalation of food and vomit: Secondary | ICD-10-CM

## 2014-07-10 DIAGNOSIS — A419 Sepsis, unspecified organism: Principal | ICD-10-CM

## 2014-07-10 DIAGNOSIS — R1314 Dysphagia, pharyngoesophageal phase: Secondary | ICD-10-CM

## 2014-07-10 DIAGNOSIS — G9340 Encephalopathy, unspecified: Secondary | ICD-10-CM

## 2014-07-10 LAB — CBC
HCT: 25.9 % — ABNORMAL LOW (ref 36.0–46.0)
Hemoglobin: 8.5 g/dL — ABNORMAL LOW (ref 12.0–15.0)
MCH: 27.2 pg (ref 26.0–34.0)
MCHC: 32.8 g/dL (ref 30.0–36.0)
MCV: 82.7 fL (ref 78.0–100.0)
PLATELETS: 215 10*3/uL (ref 150–400)
RBC: 3.13 MIL/uL — ABNORMAL LOW (ref 3.87–5.11)
RDW: 14.6 % (ref 11.5–15.5)
WBC: 7.3 10*3/uL (ref 4.0–10.5)

## 2014-07-10 LAB — BLOOD GAS, ARTERIAL
Acid-Base Excess: 4.1 mmol/L — ABNORMAL HIGH (ref 0.0–2.0)
BICARBONATE: 26.1 meq/L — AB (ref 20.0–24.0)
Drawn by: 307971
FIO2: 0.21 %
O2 SAT: 95.1 %
PCO2 ART: 31.5 mmHg — AB (ref 35.0–45.0)
PO2 ART: 73.4 mmHg — AB (ref 80.0–100.0)
Patient temperature: 98.6
TCO2: 22.5 mmol/L (ref 0–100)
pH, Arterial: 7.527 — ABNORMAL HIGH (ref 7.350–7.450)

## 2014-07-10 LAB — COMPREHENSIVE METABOLIC PANEL
ALT: 16 U/L (ref 14–54)
AST: 11 U/L — AB (ref 15–41)
Albumin: 2.3 g/dL — ABNORMAL LOW (ref 3.5–5.0)
Alkaline Phosphatase: 48 U/L (ref 38–126)
Anion gap: 9 (ref 5–15)
BILIRUBIN TOTAL: 0.6 mg/dL (ref 0.3–1.2)
BUN: 8 mg/dL (ref 6–20)
CHLORIDE: 102 mmol/L (ref 101–111)
CO2: 28 mmol/L (ref 22–32)
Calcium: 8.8 mg/dL — ABNORMAL LOW (ref 8.9–10.3)
Creatinine, Ser: 0.69 mg/dL (ref 0.44–1.00)
GFR calc Af Amer: >60 mL/min (ref >60)
GFR calc non Af Amer: >60 mL/min (ref >60)
Glucose, Bld: 140 mg/dL — ABNORMAL HIGH (ref 65–99)
Potassium: 3.4 mmol/L — ABNORMAL LOW (ref 3.5–5.1)
SODIUM: 139 mmol/L (ref 135–145)
Total Protein: 5.9 g/dL — ABNORMAL LOW (ref 6.5–8.1)

## 2014-07-10 LAB — TSH: TSH: 1.715 u[IU]/mL (ref 0.350–4.500)

## 2014-07-10 LAB — URINE CULTURE
Colony Count: NO GROWTH
Culture: NO GROWTH

## 2014-07-10 LAB — GLUCOSE, CAPILLARY
GLUCOSE-CAPILLARY: 133 mg/dL — AB (ref 65–99)
GLUCOSE-CAPILLARY: 165 mg/dL — AB (ref 65–99)
GLUCOSE-CAPILLARY: 107 mg/dL — AB (ref 65–99)
Glucose-Capillary: 146 mg/dL — ABNORMAL HIGH (ref 65–99)
Glucose-Capillary: 101 mg/dL — ABNORMAL HIGH (ref 65–99)

## 2014-07-10 LAB — PHOSPHORUS: Phosphorus: 3.3 mg/dL (ref 2.5–4.6)

## 2014-07-10 LAB — MAGNESIUM: MAGNESIUM: 1.4 mg/dL — AB (ref 1.7–2.4)

## 2014-07-10 MED ORDER — DEXTROSE-NACL 5-0.9 % IV SOLN
INTRAVENOUS | Status: DC
  Filled 2014-07-10: qty 1000

## 2014-07-10 MED ORDER — DEXTROSE-NACL 5-0.9 % IV SOLN
INTRAVENOUS | Status: DC
  Administered 2014-07-10: 17:00:00 via INTRAVENOUS

## 2014-07-10 MED ORDER — SODIUM CHLORIDE 0.9 % IJ SOLN
10.0000 mL | INTRAMUSCULAR | Status: DC | PRN
  Administered 2014-07-10: 20 mL
  Administered 2014-07-11: 10 mL
  Filled 2014-07-10 (×2): qty 40

## 2014-07-10 MED ORDER — COLLAGENASE 250 UNIT/GM EX OINT
TOPICAL_OINTMENT | Freq: Every day | CUTANEOUS | Status: DC
  Administered 2014-07-10 – 2014-07-14 (×5): via TOPICAL
  Filled 2014-07-10: qty 30

## 2014-07-10 NOTE — Evaluation (Signed)
Clinical/Bedside Swallow Evaluation Patient Details  Name: [name] MRN: [id number] Date of Birth: 1940-08-06  Today's Date: 07/10/2014 Time: SLP Start Time (ACUTE ONLY): 1010 SLP Stop Time (ACUTE ONLY): 1030 SLP Time Calculation (min) (ACUTE ONLY): 20 min  Past Medical History:  Past Medical History  Diagnosis Date  . Anxiety   . Dyspnea   . Diabetes mellitus   . Osteoarthritis   . Hypercholesteremia   . Depression   . RLS (restless legs syndrome)   . Stress   . PONV (postoperative nausea and vomiting)   . Headache(784.0)   . HTN (hypertension)     dr Johnsie Cancel   Past Surgical History:  Past Surgical History  Procedure Laterality Date  . Colonoscopy    . Abdominal hysterectomy    . Lumbar laminectomy/decompression microdiscectomy N/A 04/18/2012    Procedure: LUMBAR LAMINECTOMY/DECOMPRESSION MICRODISCECTOMY 3 LEVELS;  Surgeon: Winfield Cunas, MD;  Location: [facility];  Service: Neurosurgery;  Laterality: N/A;  Lumbar three-four, lumbar four-five, lumbar five-sacral one decompression. Synovial cyst resection lumbar four-five  . Joint replacement    . Left forearm fracture with orif Left   . Replacement total knee      right   HPI:  [name] is a 74 y.o. female who had a recent hospitalization complicated by severe sepsis due to UTI resulting in encephalopathy and acute renal failure as well as acute respiratory failure requiring intubation. she was discharged to nursing facility on May 13 Daughter states that while in at the nursing facility, patient  had a  swallow evaluation showed evidence of significant aspiration. Family states that they were told that all the food that is swallowed goes directly into the lungs. At nursing home facility they made nothing by mouth. Per family she was not having history of coughing and shortness of breath with fever since her discharge. Her GI doctor has been called and recommended transfer to emergency department. ER physician  spoke to Dr. Benson Norway who is on-call who states that GI will consult and patient may need endoscopy to clarify significant dysphagia.   Assessment / Plan / Recommendation Clinical Impression  SLp able to provide minimal PO trials with dtr present. Pt able to orally manipulate with cues and swallow is elicited without demonstrated of severe dysphagia documented in history. However, pt does have a history of regurgitation and has been suspected to suffer from a primary esophageal dysphagia, though this cannot be determined at this time. Pt is lethargic and not arousable enough to participate in needed objective testing. Will /fu tomorrow to determine readiness for MBS. Pt may benefit from short term alternate method of nutrition if mentation does not improve.     Aspiration Risk  Moderate    Diet Recommendation NPO;Alternative means - temporary   Medication Administration: Via alternative means    Other  Recommendations Recommended Consults: Consider GI evaluation;Consider esophageal assessment Oral Care Recommendations: Oral care QID   Follow Up Recommendations       Frequency and Duration min 2x/week  2 weeks   Pertinent Vitals/Pain NA    SLP Swallow Goals     Swallow Study Prior Functional Status       General Other Pertinent Information: [name] is a 74 y.o. female who had a recent hospitalization complicated by severe sepsis due to UTI resulting in encephalopathy and acute renal failure as well as acute respiratory failure requiring intubation. she was discharged to nursing facility on May 13 Daughter states that while  in at the nursing facility, patient  had a  swallow evaluation showed evidence of significant aspiration. Family states that they were told that all the food that is swallowed goes directly into the lungs. At nursing home facility they made nothing by mouth. Per family she was not having history of coughing and shortness of breath with fever since her  discharge. Her GI doctor has been called and recommended transfer to emergency department. ER physician spoke to Dr. Benson Norway who is on-call who states that GI will consult and patient may need endoscopy to clarify significant dysphagia. Type of Study: Bedside swallow evaluation Previous Swallow Assessment: see HPI Diet Prior to this Study: NPO Temperature Spikes Noted: No Respiratory Status: Room air History of Recent Intubation: No Behavior/Cognition: Lethargic/Drowsy Oral Cavity - Dentition: Adequate natural dentition/normal for age Patient Positioning: Upright in bed Baseline Vocal Quality: Normal Volitional Cough: Cognitively unable to elicit Volitional Swallow: Unable to elicit    Oral/Motor/Sensory Function Overall Oral Motor/Sensory Function: Other (comment) (unable to follow commands, no focal weakness observed)   Ice Chips Ice chips: Impaired Presentation: Spoon Oral Phase Impairments: Poor awareness of bolus Pharyngeal Phase Impairments: Suspected delayed Swallow   Thin Liquid Thin Liquid: Not tested    Nectar Thick Nectar Thick Liquid: Not tested   Honey Thick Honey Thick Liquid: Not tested   Puree Puree: Impaired Presentation: Spoon Pharyngeal Phase Impairments: Throat Clearing - Delayed   Solid   GO    Solid: Not tested      Herbie Baltimore, MA CCC-SLP [phone]  Lismary Kiehn, Katherene Ponto 07/10/2014,10:34 AM

## 2014-07-10 NOTE — Consult Note (Signed)
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  WIll offload pressure using pillow under calves Wound bed: 100% adherent yellow slough Drainage (amount, consistency, odor) Minimal serosanguinous drainage.  No odor Periwound: INtact Dressing procedure/placement/frequency: Cleanse sacrum and coccyx with NS and pat gently dry.  APply Santyl ointment (1/8 inch thickness) (opaque) to wound beds.  Cover with NS moist gauze.  Secure with ABD pad and tape.  Change daily.  Offload heel pressure with pillow under calves.  Turn and reposition every 2 hours.  Will not follow at this time.  Please re-consult if needed.  Domenic Moras RN BSN [place] Pager [phone]

## 2014-07-10 NOTE — Progress Notes (Signed)
Initial Nutrition Assessment  DOCUMENTATION CODES:  Not applicable  INTERVENTION: - Recommend NGT versus PEG with Glucerna 1.2 @ 55 mL/hr which will provide 1584 kcal, 79 grams protein, and 1063 mL free water. Free water flush per MD. - RD to continue to monitor for needs  NUTRITION DIAGNOSIS:  Inadequate oral intake related to inability to eat as evidenced by NPO status.  GOAL:  Patient will meet greater than or equal to 90% of their needs  MONITOR:  TF tolerance, Weight trends, Labs, I & O's  REASON FOR ASSESSMENT:  Malnutrition Screening Tool  ASSESSMENT: Per H&P, Swallow evaluation showed evidence of significant aspiration. Family states that they were told that all the food that is swallowed closed directly into the lungs. At nursing home facility they made nothing by mouth since yesterday.   SLP saw pt today and recommended NPO. No family or visitors in the room and pt unable to provide information. Per chart review, pt with hx of DM and currently on sliding scale and scheduled insulin. Per weight hx review, pt has lost 7 lbs (4% body weight) in 1.5 months which is not significant for time frame. Unable to meet needs. Physical assessment shows no signs of muscle or fat wasting. Labs and medications reviewed.  Height:  Ht Readings from Last 1 Encounters:  07/10/14 5\' 7"  (1.702 m)    Weight:  Wt Readings from Last 1 Encounters:  07/10/14 169 lb 5 oz (76.8 kg)    Ideal Body Weight:  61.4 kg (kg)  Wt Readings from Last 10 Encounters:  07/10/14 169 lb 5 oz (76.8 kg)  07/04/14 172 lb 13.5 oz (78.4 kg)  05/27/14 176 lb 9.4 oz (80.1 kg)  07/18/13 218 lb 0.6 oz (98.9 kg)  04/29/13 208 lb (94.348 kg)  12/28/12 215 lb (97.523 kg)  04/18/12 219 lb 9.3 oz (99.6 kg)  06/15/10 194 lb (87.998 kg)  06/03/10 180 lb (81.647 kg)  10/29/09 199 lb (90.266 kg)    BMI:  Body mass index is 26.51 kg/(m^2).  Estimated Nutritional Needs:  Kcal:  1500-1700  Protein:  65-80  grams  Fluid:  2-2.5L/day  Skin:  Wound (see comment) (stage 2 to sacrum)  Diet Order:  Diet NPO time specified  EDUCATION NEEDS:  No education needs identified at this time   Intake/Output Summary (Last 24 hours) at 07/10/14 1356 Last data filed at 07/10/14 0608  Gross per 24 hour  Intake  767.5 ml  Output      0 ml  Net  767.5 ml    Last BM:  PTA   Jarome Matin, RD, LDN Inpatient Clinical Dietitian Pager # [phone] After hours/weekend pager # [phone]

## 2014-07-10 NOTE — Progress Notes (Signed)
ANTIBIOTIC CONSULT NOTE - INITIAL  Pharmacy Consult for Zosyn Indication: rule out sepsis  Allergies  Allergen Reactions  . Morphine And Related Other (See Comments)    hallucination   . Oxycodone Other (See Comments)    hallucination   . Sulfonamide Derivatives Swelling    Patient Measurements: weight 78 kg, height 67 inches Height: 5\' 7"  (170.2 cm) Weight: 169 lb 5 oz (76.8 kg) IBW/kg (Calculated) : 61.6  Vital Signs: Temp: 98.5 F (36.9 C) (05/19 0615) Temp Source: Oral (05/19 0615) BP: 131/84 mmHg (05/19 0615) Pulse Rate: 75 (05/19 0615) Intake/Output from previous day: 05/18 0701 - 05/19 0700 In: 767.5 [I.V.:517.5; IV Piggyback:250] Out: -  Intake/Output from this shift:    Labs:  Recent Labs  07/09/14 1734 07/09/14 2148 07/10/14 0605  WBC 12.4* 10.5 7.3  HGB 10.6* 9.3* 8.5*  PLT 267 214 215  CREATININE 0.60 0.66 0.69   Estimated Creatinine Clearance: 64.9 mL/min (by C-G formula based on Cr of 0.69). No results for input(s): VANCOTROUGH, VANCOPEAK, VANCORANDOM, GENTTROUGH, GENTPEAK, GENTRANDOM, TOBRATROUGH, TOBRAPEAK, TOBRARND, AMIKACINPEAK, AMIKACINTROU, AMIKACIN in the last 72 hours.   Microbiology: Recent Results (from the past 720 hour(s))  Culture, respiratory (NON-Expectorated)     Status: None   Collection Time: 06/30/14 12:03 AM  Result Value Ref Range Status   Specimen Description SPUTUM  Final   Special Requests NONE  Final   Gram Stain   Final    ABUNDANT WBC PRESENT,BOTH PMN AND MONONUCLEAR ABUNDANT SQUAMOUS EPITHELIAL CELLS PRESENT ABUNDANT GRAM POSITIVE COCCI IN PAIRS IN CHAINS ABUNDANT GRAM POSITIVE RODS Gram Stain Report Called to,Read Back By and Verified With: Gram Stain Report Called to,Read Back By and Verified With: DENA.A AT 10:48 ON [id number] BY WEBSP    Culture   Final    ABUNDANT METHICILLIN RESISTANT STAPHYLOCOCCUS AUREUS Note: RIFAMPIN AND GENTAMICIN SHOULD NOT BE USED AS SINGLE DRUGS FOR TREATMENT OF STAPH  INFECTIONS. Performed at [facility]    Report Status 07/03/2014 FINAL  Final   Organism ID, Bacteria METHICILLIN RESISTANT STAPHYLOCOCCUS AUREUS  Final      Susceptibility   Methicillin resistant staphylococcus aureus - MIC*    CLINDAMYCIN >=8 RESISTANT Resistant     ERYTHROMYCIN >=8 RESISTANT Resistant     GENTAMICIN <=0.5 SENSITIVE Sensitive     LEVOFLOXACIN >=8 RESISTANT Resistant     OXACILLIN >=4 RESISTANT Resistant     PENICILLIN >=0.5 RESISTANT Resistant     RIFAMPIN <=0.5 SENSITIVE Sensitive     TRIMETH/SULFA <=10 SENSITIVE Sensitive     VANCOMYCIN 1 SENSITIVE Sensitive     TETRACYCLINE <=1 SENSITIVE Sensitive     * ABUNDANT METHICILLIN RESISTANT STAPHYLOCOCCUS AUREUS  Culture, blood (routine x 2)     Status: None   Collection Time: 06/30/14 12:10 AM  Result Value Ref Range Status   Specimen Description BLOOD LAC  Final   Special Requests BOTTLES DRAWN AEROBIC AND ANAEROBIC 5ML  Final   Culture   Final    STAPHYLOCOCCUS SPECIES (COAGULASE NEGATIVE) Note: THE SIGNIFICANCE OF ISOLATING THIS ORGANISM FROM A SINGLE SET OF BLOOD CULTURES WHEN MULTIPLE SETS ARE DRAWN IS UNCERTAIN. PLEASE NOTIFY THE MICROBIOLOGY DEPARTMENT WITHIN ONE WEEK IF SPECIATION AND SENSITIVITIES ARE REQUIRED. Note: Gram Stain Report Called to,Read Back By and Verified With: [facility] AT 6:41 A.M. ON 07/01/2014 WARRB Performed at [facility]    Report Status 07/02/2014 FINAL  Final  Culture, blood (routine x 2)     Status: None   Collection Time:  06/30/14 12:20 AM  Result Value Ref Range Status   Specimen Description BLOOD RIGHT HAND  Final   Special Requests BOTTLES DRAWN AEROBIC AND ANAEROBIC 5ML  Final   Culture   Final    NO GROWTH 5 DAYS Performed at [facility]    Report Status 07/06/2014 FINAL  Final  MRSA PCR Screening     Status: Abnormal   Collection Time: 06/30/14  5:30 AM  Result Value Ref Range Status   MRSA by PCR POSITIVE (A) NEGATIVE Final    Comment:         The GeneXpert MRSA Assay (FDA approved for NASAL specimens only), is one component of a comprehensive MRSA colonization surveillance program. It is not intended to diagnose MRSA infection nor to guide or monitor treatment for MRSA infections. RESULT CALLED TO, READ BACK BY AND VERIFIED WITH: Ogema RN AT 0705 ON 05.09.16 BY SHUEA   Urine culture     Status: None   Collection Time: 06/30/14  6:02 AM  Result Value Ref Range Status   Specimen Description URINE, CATHETERIZED  Final   Special Requests NONE  Final   Colony Count   Final    75,000 COLONIES/ML Performed at [facility]    Culture YEAST Performed at [facility]   Final   Report Status 07/01/2014 FINAL  Final  Culture, respiratory (tracheal aspirate)     Status: None   Collection Time: 06/30/14 11:17 AM  Result Value Ref Range Status   Specimen Description TRACHEAL ASPIRATE  Final   Special Requests NONE  Final   Gram Stain   Final    ABUNDANT WBC PRESENT, PREDOMINANTLY PMN RARE SQUAMOUS EPITHELIAL CELLS PRESENT FEW GRAM POSITIVE COCCI IN PAIRS FEW GRAM VARIABLE ROD Gram Stain Report Called to,Read Back By and Verified With: Gram Stain Report Called to,Read Back By and Verified With: KATIE.V AT [place] ON [id number] BY WEBSP    Culture   Final    ABUNDANT METHICILLIN RESISTANT STAPHYLOCOCCUS AUREUS Note: RIFAMPIN AND GENTAMICIN SHOULD NOT BE USED AS SINGLE DRUGS FOR TREATMENT OF STAPH INFECTIONS. MODERATE CANDIDA ALBICANS Performed at [facility]    Report Status 07/04/2014 FINAL  Final   Organism ID, Bacteria METHICILLIN RESISTANT STAPHYLOCOCCUS AUREUS  Final      Susceptibility   Methicillin resistant staphylococcus aureus - MIC*    CLINDAMYCIN >=8 RESISTANT Resistant     ERYTHROMYCIN >=8 RESISTANT Resistant     GENTAMICIN <=0.5 SENSITIVE Sensitive     LEVOFLOXACIN >=8 RESISTANT Resistant     OXACILLIN >=4 RESISTANT Resistant     PENICILLIN >=0.5 RESISTANT Resistant      RIFAMPIN <=0.5 SENSITIVE Sensitive     TRIMETH/SULFA <=10 SENSITIVE Sensitive     VANCOMYCIN <=0.5 SENSITIVE Sensitive     TETRACYCLINE <=1 SENSITIVE Sensitive     * ABUNDANT METHICILLIN RESISTANT STAPHYLOCOCCUS AUREUS  Clostridium Difficile by PCR     Status: None   Collection Time: 07/02/14 11:07 AM  Result Value Ref Range Status   C difficile by pcr NEGATIVE NEGATIVE Final    Medical History: Past Medical History  Diagnosis Date  . Anxiety   . Dyspnea   . Diabetes mellitus   . Osteoarthritis   . Hypercholesteremia   . Depression   . RLS (restless legs syndrome)   . Stress   . PONV (postoperative nausea and vomiting)   . Headache(784.0)   . HTN (hypertension)     dr Johnsie Cancel    Assessment: Patient is  a 74 y.o F transferred to [facility] after the staff at [facility] suspected patient aspirated at the facility.  Pharmacy already following patient for consult for vancomycin and MD started Zosyn on admission for suspected sepsis 2/2 possible aspiration on NH.  Now pharmacy consulted to dose Zosyn as well.  Fever in ED. ED CXR did not show evidence of PNA. SLP unable to eval d/t lethargy today.  Of note, pt had recent hospitalization complicated by severe sepsis due to MRSA UTI resulting in encephalopathy and acute renal failure as well as acute respiratory failure requiring intubation. She was discharged to NH with Zyvox (completed 5/17) for MRSA UTI+PNA.  5/19 vanc >> 5/18 zosyn >>  5/18 bcx x2: sent 5/18 ucx: sent / sputum: ordered  Goal of Therapy:  Vancomycin trough level 15-20 mcg/ml  Doses adjusted per renal function Eradication of infection  Plan:  Continue Zosyn 3.375g IV q8h (4 hour infusion time).  F/u cultures, SCr, clinical course.  Hershal Coria 07/10/2014,1:26 PM

## 2014-07-10 NOTE — Consult Note (Signed)
Reason for Consult: Dysphagia Referring Physician: [facility]  [name] HPI: This is a 74 year old female who was recently hospitalized for severe sepsis as a result of an UTI.  She also developed encephalopathy, ARF, and respiratory failure with resultant intubation.  At [facility] she was having issues with aspiration of her PO intake.  There was the question of a PEG tube placement.  Speech Pathology evaluated the patient, but her mentation is poor and a complete evaluation was not possible.  Prior to her hospitalization for sepsis she was declining mentally as she was struggling with multiple bouts of UTI.  Past Medical History  Diagnosis Date  . Anxiety   . Dyspnea   . Diabetes mellitus   . Osteoarthritis   . Hypercholesteremia   . Depression   . RLS (restless legs syndrome)   . Stress   . PONV (postoperative nausea and vomiting)   . Headache(784.0)   . HTN (hypertension)     dr Johnsie Cancel    Past Surgical History  Procedure Laterality Date  . Colonoscopy    . Abdominal hysterectomy    . Lumbar laminectomy/decompression microdiscectomy N/A 04/18/2012    Procedure: LUMBAR LAMINECTOMY/DECOMPRESSION MICRODISCECTOMY 3 LEVELS;  Surgeon: Winfield Cunas, MD;  Location: [facility];  Service: Neurosurgery;  Laterality: N/A;  Lumbar three-four, lumbar four-five, lumbar five-sacral one decompression. Synovial cyst resection lumbar four-five  . Joint replacement    . Left forearm fracture with orif Left   . Replacement total knee      right    Family History  Problem Relation Age of Onset  . Diabetes Father   . High blood pressure Father   . High blood pressure Mother     Social History:  reports that she has never smoked. She has never used smokeless tobacco. She reports that she does not drink alcohol or use illicit drugs.  Allergies:  Allergies  Allergen Reactions  . Morphine And Related Other (See Comments)    hallucination   . Oxycodone Other (See  Comments)    hallucination   . Sulfonamide Derivatives Swelling    Medications:  Scheduled: . collagenase   Topical Daily  . enoxaparin (LOVENOX) injection  40 mg Subcutaneous Q24H  . insulin aspart  0-9 Units Subcutaneous 6 times per day  . insulin detemir  5 Units Subcutaneous QHS  . piperacillin-tazobactam (ZOSYN)  IV  3.375 g Intravenous 3 times per day  . sodium chloride  3 mL Intravenous Q12H  . vancomycin  750 mg Intravenous Q12H   Continuous:   Results for orders placed or performed during the hospital encounter of 07/09/14 (from the past 24 hour(s))  Brain natriuretic peptide     Status: Abnormal   Collection Time: 07/09/14  2:34 PM  Result Value Ref Range   B Natriuretic Peptide 144.3 (H) 0.0 - 100.0 pg/mL  CBC with Differential/Platelet     Status: Abnormal   Collection Time: 07/09/14  5:34 PM  Result Value Ref Range   WBC 12.4 (H) 4.0 - 10.5 K/uL   RBC 3.96 3.87 - 5.11 MIL/uL   Hemoglobin 10.6 (L) 12.0 - 15.0 g/dL   HCT 32.7 (L) 36.0 - 46.0 %   MCV 82.6 78.0 - 100.0 fL   MCH 26.8 26.0 - 34.0 pg   MCHC 32.4 30.0 - 36.0 g/dL   RDW 14.6 11.5 - 15.5 %   Platelets 267 150 - 400 K/uL   Neutrophils Relative % 86 (H) 43 - 77 %  Neutro Abs 10.7 (H) 1.7 - 7.7 K/uL   Lymphocytes Relative 7 (L) 12 - 46 %   Lymphs Abs 0.9 0.7 - 4.0 K/uL   Monocytes Relative 7 3 - 12 %   Monocytes Absolute 0.9 0.1 - 1.0 K/uL   Eosinophils Relative 0 0 - 5 %   Eosinophils Absolute 0.0 0.0 - 0.7 K/uL   Basophils Relative 0 0 - 1 %   Basophils Absolute 0.0 0.0 - 0.1 K/uL  Comprehensive metabolic panel     Status: Abnormal   Collection Time: 07/09/14  5:34 PM  Result Value Ref Range   Sodium 138 135 - 145 mmol/L   Potassium 3.8 3.5 - 5.1 mmol/L   Chloride 100 (L) 101 - 111 mmol/L   CO2 26 22 - 32 mmol/L   Glucose, Bld 132 (H) 65 - 99 mg/dL   BUN 8 6 - 20 mg/dL   Creatinine, Ser 0.60 0.44 - 1.00 mg/dL   Calcium 9.4 8.9 - 10.3 mg/dL   Total Protein 7.3 6.5 - 8.1 g/dL   Albumin 2.9 (L)  3.5 - 5.0 g/dL   AST 14 (L) 15 - 41 U/L   ALT 21 14 - 54 U/L   Alkaline Phosphatase 61 38 - 126 U/L   Total Bilirubin 1.0 0.3 - 1.2 mg/dL   GFR calc non Af Amer >60 >60 mL/min   GFR calc Af Amer >60 >60 mL/min   Anion gap 12 5 - 15  I-stat troponin, ED     Status: None   Collection Time: 07/09/14  5:39 PM  Result Value Ref Range   Troponin i, poc 0.01 0.00 - 0.08 ng/mL   Comment 3          Urinalysis, Routine w reflex microscopic     Status: Abnormal   Collection Time: 07/09/14  6:50 PM  Result Value Ref Range   Color, Urine YELLOW YELLOW   APPearance CLOUDY (A) CLEAR   Specific Gravity, Urine 1.008 1.005 - 1.030   pH 7.0 5.0 - 8.0   Glucose, UA 250 (A) NEGATIVE mg/dL   Hgb urine dipstick NEGATIVE NEGATIVE   Bilirubin Urine NEGATIVE NEGATIVE   Ketones, ur NEGATIVE NEGATIVE mg/dL   Protein, ur NEGATIVE NEGATIVE mg/dL   Urobilinogen, UA 0.2 0.0 - 1.0 mg/dL   Nitrite NEGATIVE NEGATIVE   Leukocytes, UA NEGATIVE NEGATIVE  Lactic acid, plasma     Status: None   Collection Time: 07/09/14  9:48 PM  Result Value Ref Range   Lactic Acid, Venous 0.9 0.5 - 2.0 mmol/L  CBC     Status: Abnormal   Collection Time: 07/09/14  9:48 PM  Result Value Ref Range   WBC 10.5 4.0 - 10.5 K/uL   RBC 3.52 (L) 3.87 - 5.11 MIL/uL   Hemoglobin 9.3 (L) 12.0 - 15.0 g/dL   HCT 29.0 (L) 36.0 - 46.0 %   MCV 82.4 78.0 - 100.0 fL   MCH 26.4 26.0 - 34.0 pg   MCHC 32.1 30.0 - 36.0 g/dL   RDW 14.4 11.5 - 15.5 %   Platelets 214 150 - 400 K/uL  Creatinine, serum     Status: None   Collection Time: 07/09/14  9:48 PM  Result Value Ref Range   Creatinine, Ser 0.66 0.44 - 1.00 mg/dL   GFR calc non Af Amer >60 >60 mL/min   GFR calc Af Amer >60 >60 mL/min  Glucose, capillary     Status: Abnormal   Collection Time: 07/09/14 11:04  PM  Result Value Ref Range   Glucose-Capillary 144 (H) 65 - 99 mg/dL  Glucose, capillary     Status: Abnormal   Collection Time: 07/10/14  3:39 AM  Result Value Ref Range    Glucose-Capillary 146 (H) 65 - 99 mg/dL  Magnesium     Status: Abnormal   Collection Time: 07/10/14  6:05 AM  Result Value Ref Range   Magnesium 1.4 (L) 1.7 - 2.4 mg/dL  Phosphorus     Status: None   Collection Time: 07/10/14  6:05 AM  Result Value Ref Range   Phosphorus 3.3 2.5 - 4.6 mg/dL  TSH     Status: None   Collection Time: 07/10/14  6:05 AM  Result Value Ref Range   TSH 1.715 0.350 - 4.500 uIU/mL  Comprehensive metabolic panel     Status: Abnormal   Collection Time: 07/10/14  6:05 AM  Result Value Ref Range   Sodium 139 135 - 145 mmol/L   Potassium 3.4 (L) 3.5 - 5.1 mmol/L   Chloride 102 101 - 111 mmol/L   CO2 28 22 - 32 mmol/L   Glucose, Bld 140 (H) 65 - 99 mg/dL   BUN 8 6 - 20 mg/dL   Creatinine, Ser 0.69 0.44 - 1.00 mg/dL   Calcium 8.8 (L) 8.9 - 10.3 mg/dL   Total Protein 5.9 (L) 6.5 - 8.1 g/dL   Albumin 2.3 (L) 3.5 - 5.0 g/dL   AST 11 (L) 15 - 41 U/L   ALT 16 14 - 54 U/L   Alkaline Phosphatase 48 38 - 126 U/L   Total Bilirubin 0.6 0.3 - 1.2 mg/dL   GFR calc non Af Amer >60 >60 mL/min   GFR calc Af Amer >60 >60 mL/min   Anion gap 9 5 - 15  CBC     Status: Abnormal   Collection Time: 07/10/14  6:05 AM  Result Value Ref Range   WBC 7.3 4.0 - 10.5 K/uL   RBC 3.13 (L) 3.87 - 5.11 MIL/uL   Hemoglobin 8.5 (L) 12.0 - 15.0 g/dL   HCT 25.9 (L) 36.0 - 46.0 %   MCV 82.7 78.0 - 100.0 fL   MCH 27.2 26.0 - 34.0 pg   MCHC 32.8 30.0 - 36.0 g/dL   RDW 14.6 11.5 - 15.5 %   Platelets 215 150 - 400 K/uL  Glucose, capillary     Status: Abnormal   Collection Time: 07/10/14  7:30 AM  Result Value Ref Range   Glucose-Capillary 133 (H) 65 - 99 mg/dL  Glucose, capillary     Status: Abnormal   Collection Time: 07/10/14 12:03 PM  Result Value Ref Range   Glucose-Capillary 165 (H) 65 - 99 mg/dL     Dg Chest 2 View  07/09/2014   CLINICAL DATA:  Productive cough, possible aspiration 4 days ago, diabetes, hypertension  EXAM: CHEST  2 VIEW  COMPARISON:  07/02/2014  FINDINGS: RIGHT  arm PICC line tip projects over SVC.  Normal heart size, mediastinal contours and pulmonary vascularity.  Atherosclerotic calcification aorta.  Minimal RIGHT basilar atelectasis.  Lungs otherwise clear.  Improved aeration versus previous study.  No pleural effusion or pneumothorax.  IMPRESSION: Improved aeration versus prior exam.   Electronically Signed   By: Lavonia Dana M.D.   On: 07/09/2014 18:04   Ct Head Wo Contrast  07/09/2014   CLINICAL DATA:  Altered mental status  EXAM: CT HEAD WITHOUT CONTRAST  TECHNIQUE: Contiguous axial images were obtained from the base of  the skull through the vertex without intravenous contrast.  COMPARISON:  05/20/2014  FINDINGS: Bony calvarium is intact. No gross soft tissue abnormality is seen. There is been interval improvement in the degree of opacification within the left maxillary antrum. Diffuse atrophic changes are noted. Mild chronic white matter ischemic change is seen. No findings to suggest acute hemorrhage, acute infarction or space-occupying mass lesion are noted.  IMPRESSION: Chronic atrophic in ischemic changes without acute abnormality.   Electronically Signed   By: Inez Catalina M.D.   On: 07/09/2014 17:57    ROS:  As stated above in the HPI otherwise negative.  Blood pressure 131/84, pulse 75, temperature 98.5 F (36.9 C), temperature source Oral, resp. rate 20, height 5\' 7"  (1.702 m), weight 76.8 kg (169 lb 5 oz), SpO2 96 %.    PE: Gen: NAD, Alert and Oriented HEENT:  [place]/AT, EOMI Neck: Supple, no LAD Lungs: CTA Bilaterally CV: RRR without M/G/R ABM: Soft, NTND, +BS Ext: No C/C/E  Assessment/Plan: 1) Dysphagia. 2) Advanced dementia. 3) Aspiration.   I had a long discussion with her daughter and her husband.  They are agreeable to pursuing Hospice care.  During my discussions with the family she did perk up mentally and she seemed to have an understanding of the situation.  In fact she cried and said that [name] is "going away".  I did  tell the family that patients can leave hospice if they improve, but overall it appears that her decline is accelerating.  Performing an EGD and/or placing a PEG tube will not be of any benefit.  Plan: 1) Hospice consultation.  Earlie Arciga D 07/10/2014, 2:32 PM

## 2014-07-10 NOTE — Progress Notes (Addendum)
PATIENT DETAILS Name: [name] Age: 74 y.o. Sex: female Date of Birth: 11-Apr-1940 Admit Date: 07/09/2014 Admitting Physician Toy Baker, MD ZJI:[id number],OFBPZW G, MD  Subjective: Very lethargic-opens eyes-but does not speak-however earlier this morning did say a few words to me. Per daughter at bedside-patient has been like this since this past Sunday. Last oral intake was this past Tuesday when she had breakfast.  Assessment/Plan: Active Problems:   Acute metabolic encephalopathy: Suspect secondary to dehydration and aspiration pneumonia. CT head negative for acute abnormalities. No improvement since admission, continue IV vancomycin have added Zosyn. Check MRI and an ABG. Currently maintaining airway, daughter aware that patient has poor overall prognosis given that this is a second hospitalization within a week-has dementia/nonambulatory at baseline. Apparently per daughter, she has been mostly nonverbal and very lethargic since this past Sunday.   Suspected aspiration pneumonia: Presenting with encephalopathy, apparently since the past few days has had difficulty swallowing, had a outpatient swallow eval that her daughter-was very suggestive of aspiration. Apparently was made nothing by mouth on Tuesday at her SNF prior to this admission. Started Zosyn, since no clinical improvement. Chest x-ray on admission was negative for pneumonia, will repeat today.   Sepsis: Secondary to above. Continue IV fluids, IV antibiotics. Cultures currently pending-Will follow.    Dysphagia: Was on a dysphagia 3 diet last admission-suspect ascending dysphagia secondary to acute illness with aspiration. Given her persistent encephalopathy with nonverbal status-Will need to rule out a CVA. GI and speech therapy consulted, however encephalopathy/confusion/lethargy may prevent further investigations. Currently remains nothing by mouth, will continue to hydrate with IV fluids for now  and await further investigations (see above). I have initiated goals of care discussion with the patient's daughter at bedside this afternoon-patient's daughter will discuss with patient's husband and will let us know. Family does not seem keen regarding a feeding tube. If family desires, and if no improvement in encephalopathy the next few days-we may need to start NG feeds.    Hypertension: Blood pressure currently controlled-given encephalopathy/nothing by mouth status-all oral medications on hold. Continue with as needed labetalol.    Diabetes: Continue Levemir 5 units and SSI. CBGs stable. Will follow closely.    Stage II decubitus ulcer: Present prior to admission. When care following.     Dementia: Currently encephalopathic. Supportive care for now.     Palliative care: 74 year old black female with a history of dementia-nonambulatory-bed to wheelchair bound-who recently had septic shock along with respiratory failure requiring mechanical intubation. She was discharged from this facility on 07/04/14. Per daughter she was in good state of health for 2 days, then started becoming very lethargic. While at SNF, lethargy became worse, and she became nonverbal. He also underwent a swallow evaluation at skilled nursing facility that showed significant dysphagia and was kept nothing by mouth since this past Tuesday. She was subsequently admitted to this facility on 5/18 with worsening encephalopathy, concern for aspiration and sepsis. This M.D. had a long conversation with the patient's daughter at bedside, explained that patient probably has a poor overall prognosis, and that we will continue with current treatments as noted above. Did discuss regarding DO NOT RESUSCITATE status, issues daughter will discuss among other family members and will let us know. Family aware that patient has potential to deteriorate further and overall prognosis remains poor.  Addendum 3:30 pm-spoke with daughter at bedside-who  has had a chance to talk with  her dad (pt's husband)-family now agreeable for DNR. They do not desire any aggressive care. However will continue with IVF, IV Abx and other investigations for now-to see if encephalopathy gets better.  Disposition: Remain inpatient  Antimicrobial agents  See below  Anti-infectives    Start     Dose/Rate Route Frequency Ordered Stop   07/09/14 2230  vancomycin (VANCOCIN) IVPB 750 mg/150 ml premix     750 mg 150 mL/hr over 60 Minutes Intravenous Every 12 hours 07/09/14 2212     07/09/14 2200  piperacillin-tazobactam (ZOSYN) IVPB 3.375 g     3.375 g 12.5 mL/hr over 240 Minutes Intravenous 3 times per day 07/09/14 2150        DVT Prophylaxis: Prophylactic Lovenox   Code Status: Full code  Family Communication Daughter at bedside  Procedures: None  CONSULTS:  None  Time spent 40 minutes-Greater than 50% of this time was spent in counseling, explanation of diagnosis, planning of further management, and coordination of care.  MEDICATIONS: Scheduled Meds: . collagenase   Topical Daily  . enoxaparin (LOVENOX) injection  40 mg Subcutaneous Q24H  . insulin aspart  0-9 Units Subcutaneous 6 times per day  . insulin detemir  5 Units Subcutaneous QHS  . piperacillin-tazobactam (ZOSYN)  IV  3.375 g Intravenous 3 times per day  . sodium chloride  3 mL Intravenous Q12H  . vancomycin  750 mg Intravenous Q12H   Continuous Infusions:  PRN Meds:.acetaminophen, acetaminophen **OR** acetaminophen, albuterol, bisacodyl, labetalol, LORazepam, morphine injection, ondansetron **OR** ondansetron (ZOFRAN) IV, sodium chloride    PHYSICAL EXAM: Vital signs in last 24 hours: Filed Vitals:   07/09/14 2210 07/10/14 0200 07/10/14 0615 07/10/14 1352  BP: 135/82 147/81 131/84   Pulse: 94 88 75   Temp: 99.3 F (37.4 C) 98.3 F (36.8 C) 98.5 F (36.9 C)   TempSrc: Oral Oral Oral   Resp: 20 20 20    Height: 5\' 7"  (1.702 m)   5\' 7"  (1.702 m)  Weight: 76.8 kg  (169 lb 5 oz)   76.8 kg (169 lb 5 oz)  SpO2: 98% 98% 96%     Weight change:  Filed Weights   07/09/14 2210 07/10/14 1352  Weight: 76.8 kg (169 lb 5 oz) 76.8 kg (169 lb 5 oz)   Body mass index is 26.51 kg/(m^2).   Gen Exam: Not in acute distress-but very lethargic-sleeping most the time-awakes with physical stimulation-tracks movements-but mostly nonverbal although did speak a few words to me this morning. Neck: Supple,  Chest: B/L Clear-anteriorly  CVS: S1 S2 Regular, no murmurs.  Abdomen: soft, BS +, non tender, non distended.  Extremities: no edema, lower extremities warm to touch. Neurologic: Seems to move upper extremities-squeezes both my hands. Does not move any of her lower extremities-per daughter she is nonambulatory. Unable to evaluate further Skin: No Rash.     Intake/Output from previous day:  Intake/Output Summary (Last 24 hours) at 07/10/14 1435 Last data filed at 07/10/14 0608  Gross per 24 hour  Intake  767.5 ml  Output      0 ml  Net  767.5 ml     LAB RESULTS: CBC  Recent Labs Lab 07/04/14 0900 07/09/14 1734 07/09/14 2148 07/10/14 0605  WBC 7.7 12.4* 10.5 7.3  HGB 9.5* 10.6* 9.3* 8.5*  HCT 28.8* 32.7* 29.0* 25.9*  PLT 148* 267 214 215  MCV 81.4 82.6 82.4 82.7  MCH 26.8 26.8 26.4 27.2  MCHC 33.0 32.4 32.1 32.8  RDW 13.7 14.6 14.4  14.6  LYMPHSABS  --  0.9  --   --   MONOABS  --  0.9  --   --   EOSABS  --  0.0  --   --   BASOSABS  --  0.0  --   --     Chemistries   Recent Labs Lab 07/04/14 0900 07/09/14 1734 07/09/14 2148 07/10/14 0605  NA 142 138  --  139  K 2.7* 3.8  --  3.4*  CL 106 100*  --  102  CO2 26 26  --  28  GLUCOSE 106* 132*  --  140*  BUN 15 8  --  8  CREATININE 0.67 0.60 0.66 0.69  CALCIUM 8.7* 9.4  --  8.8*  MG  --   --   --  1.4*    CBG:  Recent Labs Lab 07/04/14 1226 07/09/14 2304 07/10/14 0339 07/10/14 0730 07/10/14 1203  GLUCAP 199* 144* 146* 133* 165*    GFR Estimated Creatinine Clearance: 64.9  mL/min (by C-G formula based on Cr of 0.69).  Coagulation profile No results for input(s): INR, PROTIME in the last 168 hours.  Cardiac Enzymes No results for input(s): CKMB, TROPONINI, MYOGLOBIN in the last 168 hours.  Invalid input(s): CK  Invalid input(s): POCBNP No results for input(s): DDIMER in the last 72 hours. No results for input(s): HGBA1C in the last 72 hours. No results for input(s): CHOL, HDL, LDLCALC, TRIG, CHOLHDL, LDLDIRECT in the last 72 hours.  Recent Labs  07/10/14 0605  TSH 1.715   No results for input(s): VITAMINB12, FOLATE, FERRITIN, TIBC, IRON, RETICCTPCT in the last 72 hours. No results for input(s): LIPASE, AMYLASE in the last 72 hours.  Urine Studies No results for input(s): UHGB, CRYS in the last 72 hours.  Invalid input(s): UACOL, UAPR, USPG, UPH, UTP, UGL, UKET, UBIL, UNIT, UROB, ULEU, UEPI, UWBC, URBC, UBAC, CAST, UCOM, BILUA  MICROBIOLOGY: Recent Results (from the past 240 hour(s))  Clostridium Difficile by PCR     Status: None   Collection Time: 07/02/14 11:07 AM  Result Value Ref Range Status   C difficile by pcr NEGATIVE NEGATIVE Final    RADIOLOGY STUDIES/RESULTS: Dg Chest 2 View  07/09/2014   CLINICAL DATA:  Productive cough, possible aspiration 4 days ago, diabetes, hypertension  EXAM: CHEST  2 VIEW  COMPARISON:  07/02/2014  FINDINGS: RIGHT arm PICC line tip projects over SVC.  Normal heart size, mediastinal contours and pulmonary vascularity.  Atherosclerotic calcification aorta.  Minimal RIGHT basilar atelectasis.  Lungs otherwise clear.  Improved aeration versus previous study.  No pleural effusion or pneumothorax.  IMPRESSION: Improved aeration versus prior exam.   Electronically Signed   By: Lavonia Dana M.D.   On: 07/09/2014 18:04   Ct Head Wo Contrast  07/09/2014   CLINICAL DATA:  Altered mental status  EXAM: CT HEAD WITHOUT CONTRAST  TECHNIQUE: Contiguous axial images were obtained from the base of the skull through the vertex  without intravenous contrast.  COMPARISON:  05/20/2014  FINDINGS: Bony calvarium is intact. No gross soft tissue abnormality is seen. There is been interval improvement in the degree of opacification within the left maxillary antrum. Diffuse atrophic changes are noted. Mild chronic white matter ischemic change is seen. No findings to suggest acute hemorrhage, acute infarction or space-occupying mass lesion are noted.  IMPRESSION: Chronic atrophic in ischemic changes without acute abnormality.   Electronically Signed   By: Inez Catalina M.D.   On: 07/09/2014 17:57  Dg Chest Port 1 View  07/02/2014   CLINICAL DATA:  Atelectasis.  EXAM: PORTABLE CHEST - 1 VIEW  COMPARISON:  06/30/2014.  FINDINGS: Interim removal of endotracheal tube. Right PICC line in stable position. Mild fullness of the mediastinum noted, this is most likely related to tortuous thoracic aorta and prominent great vessels as well as AP portable technique . Cardiomegaly with mild pulmonary vascular prominence interstitial prominence suggesting mild congestive heart failure. Bibasilar subsegmental atelectasis. Tiny bilateral pleural effusions cannot be excluded. No pneumothorax.  IMPRESSION: 1. Right PICC line in stable position. 2. Cardiomegaly with mild pulmonary vascular prominence and mild interstitial prominence suggesting mild congestive heart failure. Tiny bilateral pleural effusions cannot be excluded. 3. New onset of subsegmental atelectasis both lung bases.   Electronically Signed   By: Marcello Moores  Register   On: 07/02/2014 07:39   Dg Chest Portable 1 View  06/30/2014   CLINICAL DATA:  Endotracheal tube placement. Difficulty breathing. Fever.  EXAM: PORTABLE CHEST - 1 VIEW  COMPARISON:  05/28/2014  FINDINGS: Endotracheal tube tip measures 2.6 cm above the carina. Right PICC line tip overlies the upper SVC region. No pneumothorax. Shallow inspiration. Heart size and pulmonary vascularity are normal. No focal airspace disease or consolidation  in the lungs. No blunting of costophrenic angles. Tortuous aorta.  IMPRESSION: Appliances appear to be in satisfactory location. No evidence of active pulmonary disease.   Electronically Signed   By: Lucienne Capers M.D.   On: 06/30/2014 00:46    Oren Binet, MD  [facility] Pager:[phone]  If 7PM-7AM, please contact night-coverage [email or web address] Password [email or web address] 07/10/2014, 2:35 PM   LOS: 1 day

## 2014-07-11 ENCOUNTER — Inpatient Hospital Stay (HOSPITAL_COMMUNITY): Payer: Medicare Other

## 2014-07-11 DIAGNOSIS — I10 Essential (primary) hypertension: Secondary | ICD-10-CM

## 2014-07-11 LAB — BASIC METABOLIC PANEL
ANION GAP: 10 (ref 5–15)
BUN: 7 mg/dL (ref 6–20)
CO2: 28 mmol/L (ref 22–32)
Calcium: 8.8 mg/dL — ABNORMAL LOW (ref 8.9–10.3)
Chloride: 106 mmol/L (ref 101–111)
Creatinine, Ser: 0.65 mg/dL (ref 0.44–1.00)
GLUCOSE: 102 mg/dL — AB (ref 65–99)
POTASSIUM: 3.5 mmol/L (ref 3.5–5.1)
Sodium: 144 mmol/L (ref 135–145)

## 2014-07-11 LAB — CBC
HCT: 26.7 % — ABNORMAL LOW (ref 36.0–46.0)
HEMOGLOBIN: 8.8 g/dL — AB (ref 12.0–15.0)
MCH: 27.4 pg (ref 26.0–34.0)
MCHC: 33 g/dL (ref 30.0–36.0)
MCV: 83.2 fL (ref 78.0–100.0)
Platelets: 215 10*3/uL (ref 150–400)
RBC: 3.21 MIL/uL — AB (ref 3.87–5.11)
RDW: 14.5 % (ref 11.5–15.5)
WBC: 7.1 10*3/uL (ref 4.0–10.5)

## 2014-07-11 LAB — VANCOMYCIN, TROUGH: Vancomycin Tr: 14 ug/mL (ref 10.0–20.0)

## 2014-07-11 LAB — HEMOGLOBIN A1C
HEMOGLOBIN A1C: 9.1 % — AB (ref 4.8–5.6)
MEAN PLASMA GLUCOSE: 214 mg/dL

## 2014-07-11 LAB — GLUCOSE, CAPILLARY
GLUCOSE-CAPILLARY: 187 mg/dL — AB (ref 65–99)
GLUCOSE-CAPILLARY: 180 mg/dL — AB (ref 65–99)
Glucose-Capillary: 116 mg/dL — ABNORMAL HIGH (ref 65–99)
Glucose-Capillary: 126 mg/dL — ABNORMAL HIGH (ref 65–99)
Glucose-Capillary: 182 mg/dL — ABNORMAL HIGH (ref 65–99)
Glucose-Capillary: 99 mg/dL (ref 65–99)

## 2014-07-11 MED ORDER — VANCOMYCIN HCL IN DEXTROSE 1-5 GM/200ML-% IV SOLN
1000.0000 mg | Freq: Two times a day (BID) | INTRAVENOUS | Status: DC
  Administered 2014-07-11 – 2014-07-12 (×2): 1000 mg via INTRAVENOUS
  Filled 2014-07-11 (×2): qty 200

## 2014-07-11 MED ORDER — GADOBENATE DIMEGLUMINE 529 MG/ML IV SOLN
20.0000 mL | Freq: Once | INTRAVENOUS | Status: AC | PRN
  Administered 2014-07-11: 16 mL via INTRAVENOUS

## 2014-07-11 MED ORDER — RESOURCE THICKENUP CLEAR PO POWD
ORAL | Status: DC | PRN
  Filled 2014-07-11: qty 125

## 2014-07-11 MED ORDER — PRAMIPEXOLE DIHYDROCHLORIDE 0.25 MG PO TABS
0.3750 mg | ORAL_TABLET | Freq: Three times a day (TID) | ORAL | Status: DC
  Administered 2014-07-11 – 2014-07-14 (×10): 0.375 mg via ORAL
  Filled 2014-07-11 (×13): qty 1

## 2014-07-11 MED ORDER — ASPIRIN 81 MG PO CHEW
81.0000 mg | CHEWABLE_TABLET | Freq: Every day | ORAL | Status: DC
  Administered 2014-07-11 – 2014-07-14 (×4): 81 mg via ORAL
  Filled 2014-07-11 (×5): qty 1

## 2014-07-11 MED ORDER — METOPROLOL SUCCINATE ER 50 MG PO TB24
50.0000 mg | ORAL_TABLET | Freq: Every day | ORAL | Status: DC
  Administered 2014-07-11 – 2014-07-14 (×4): 50 mg via ORAL
  Filled 2014-07-11 (×4): qty 1

## 2014-07-11 NOTE — Progress Notes (Signed)
Speech Language Pathology Treatment: Dysphagia  Patient Details Name: [name] MRN: [id number] DOB: June 16, 1940 Today's Date: 07/11/2014 Time: 1040-1057 SLP Time Calculation (min) (ACUTE ONLY): 17 min  Assessment / Plan / Recommendation Clinical Impression  Pt demonstrates significant improvement in arousal, despite having been given ativan earlier this am for MRI. Pt able to participate in self feeding, fully aware of PO trials, Oral transit with puree and liquids WNL though swallow does appear delayed. No overt signs of aspiration noted for 4 oz pf puree, and 4 oz of thin liquids or nectar. Recommend pt initiate conservative dys 1/nectar thick liquids. Continue to recommend objective test given report of dysphagia at SNF, concern for esophageal dysphagia and dx of RLL pna. Will f/u with MBS tomorrow.    HPI Other Pertinent Information: [name] is a 74 y.o. female who had a recent hospitalization complicated by severe sepsis due to UTI resulting in encephalopathy and acute renal failure as well as acute respiratory failure requiring intubation. she was discharged to nursing facility on May 13 Daughter states that while in at the nursing facility, patient  had a  swallow evaluation showed evidence of significant aspiration. Family states that they were told that all the food that is swallowed goes directly into the lungs. At nursing home facility they made nothing by mouth. Per family she was not having history of coughing and shortness of breath with fever since her discharge. Her GI doctor has been called and recommended transfer to emergency department. ER physician spoke to Dr. Benson Norway who is on-call who states that GI will consult and patient may need endoscopy to clarify significant dysphagia.   Pertinent Vitals    SLP Plan  Continue with current plan of care;MBS    Recommendations Diet recommendations: Dysphagia 1 (puree);Nectar-thick liquid Liquids provided via:  Cup Medication Administration: Crushed with puree Supervision: Staff to assist with self feeding Compensations: Slow rate;Small sips/bites Postural Changes and/or Swallow Maneuvers: Seated upright 90 degrees;Upright 30-60 min after meal              Oral Care Recommendations: Oral care BID Follow up Recommendations: Skilled Nursing facility Plan: Continue with current plan of care;MBS    GO    Herbie Baltimore, MA CCC-SLP [phone]  Lynann Beaver 07/11/2014, 11:02 AM

## 2014-07-11 NOTE — Progress Notes (Signed)
PHARMACY - VANCOMYCIN (brief note)  Patient on Day #2 Vancomycin along with Zosyn for treatment of sepsis  Vancomycin trough = 14 mcg/ml (goal 15-20 mcg/ml) on regimen of Vancomycn 750mg  IV q12h  Scr = 0.65 with CrCl ~ 65 ml/min Patient weight = 76.8 kg  Last dose of Vancomycin 750mg  charted as given @ 10:37 this AM  Plan:  Increase Vancomycin to 1000mg  IV q12h           F/U final cultures/sensitivities results           Recheck Vanc trough when appropriate  Leone Haven, PharmD 07/11/14 @ 22:57

## 2014-07-11 NOTE — Progress Notes (Signed)
PATIENT DETAILS Name: [name] Age: 74 y.o. Sex: female Date of Birth: 02-19-1941 Admit Date: 07/09/2014 Admitting Physician Toy Baker, MD DTO:[email or web address],DXIPJA G, MD  Subjective: Much more awake and alert. Talking and following commands.  Assessment/Plan: Active Problems:   Acute metabolic encephalopathy: Suspect secondary to dehydration and aspiration pneumonia. CT head negative for acute abnormalities. MRI brain negative for CVA. Significantly improved, no much more awake and alert. Continue IV antibiotics.   Suspected aspiration pneumonia: Initial chest x-ray negative for pneumonia, repeat chest x-ray post-hydration suggests right lower lobe infiltrate highly suspicious for aspiration pneumonia. Continue vancomycin/Zosyn. Await cultures. Much improved-mentation significantly better.   Sepsis: Secondary to above. Continue IV fluids, IV antibiotics. Sepsis physiology has resolved Cultures currently pending-Will follow.    Dysphagia: Suspect chronic dysphagia at baseline-apparently was on a dysphagia 3 diet prior to her last discharge. Suspect worsening dysphagia secondary to acute illness. MRI brain negative for CVA. Seen by speech therapy today, started on a dysphagia 1 diet. Daughter/patient fully aware of aspiration risks-they are accepting.    Hypertension: Resume metoprolol now that oral intake resumed.. Will resume other medications depending on clinical course. Follow BP closely.    Diabetes: Continue Levemir 5 units and SSI. CBGs stable. Diet being advanced-Will follow CBGs closely and adjust accordingly.    Stage II decubitus ulcer: Present prior to admission. Wound care following.    Dementia: Currently encephalopathic. Supportive care for now.     Palliative care: 74 year old black female with a history of dementia-nonambulatory-bed to wheelchair bound-who recently had septic shock along with respiratory failure requiring mechanical  intubation. She was discharged from this facility on 07/04/14. Per daughter she was in good state of health for 2 days, then started becoming very lethargic. While at SNF, lethargy became worse, and she became nonverbal. She so underwent a swallow evaluation at skilled nursing facility that showed significant dysphagia and was kept nothing by mouth since this past Tuesday. She was subsequently admitted to this facility on 5/18 with worsening encephalopathy, concern for aspiration and sepsis. This M.D. had a long conversation with the patient's daughter at bedside, explained that patient probably has a poor overall prognosis, and that we will continue with current treatments as noted above. Family agreeable to a  DO NOT RESUSCITATE status,and do not desire to pursue peg feedings. Will benefit from palliative care follow up while at SNF.   Disposition: Remain inpatient-Suspect back to SNF n 5/23  Antimicrobial agents  See below  Anti-infectives    Start     Dose/Rate Route Frequency Ordered Stop   07/09/14 2230  vancomycin (VANCOCIN) IVPB 750 mg/150 ml premix     750 mg 150 mL/hr over 60 Minutes Intravenous Every 12 hours 07/09/14 2212     07/09/14 2200  piperacillin-tazobactam (ZOSYN) IVPB 3.375 g     3.375 g 12.5 mL/hr over 240 Minutes Intravenous 3 times per day 07/09/14 2150        DVT Prophylaxis: Prophylactic Lovenox   Code Status: Full code  Family Communication Daughter at bedside  Procedures: None  CONSULTS:  None  Time spent 30 minutes-Greater than 50% of this time was spent in counseling, explanation of diagnosis, planning of further management, and coordination of care.  MEDICATIONS: Scheduled Meds: . collagenase   Topical Daily  . enoxaparin (LOVENOX) injection  40 mg Subcutaneous Q24H  . insulin aspart  0-9 Units Subcutaneous 6 times per day  .  insulin detemir  5 Units Subcutaneous QHS  . piperacillin-tazobactam (ZOSYN)  IV  3.375 g Intravenous 3 times per day   . pramipexole  0.375 mg Oral TID  . sodium chloride  3 mL Intravenous Q12H  . vancomycin  750 mg Intravenous Q12H   Continuous Infusions: . dextrose 5 % and 0.9% NaCl 75 mL/hr at 07/10/14 1728   PRN Meds:.acetaminophen, acetaminophen **OR** acetaminophen, albuterol, bisacodyl, labetalol, LORazepam, morphine injection, ondansetron **OR** ondansetron (ZOFRAN) IV, sodium chloride    PHYSICAL EXAM: Vital signs in last 24 hours: Filed Vitals:   07/10/14 1352 07/10/14 1456 07/10/14 2021 07/11/14 0421  BP:  140/65 146/67 151/76  Pulse:  87 82 88  Temp:  99 F (37.2 C) 99.1 F (37.3 C) 99.2 F (37.3 C)  TempSrc:  Axillary Oral Oral  Resp:  20 18 20   Height: 5\' 7"  (1.702 m)     Weight: 76.8 kg (169 lb 5 oz)     SpO2:  97% 97% 94%    Weight change: 0 kg (0 lb) Filed Weights   07/09/14 2210 07/10/14 1352  Weight: 76.8 kg (169 lb 5 oz) 76.8 kg (169 lb 5 oz)   Body mass index is 26.51 kg/(m^2).   Gen Exam: Awake and alert-speaking. Follows commands Neck: Supple,  Chest: B/L Clear-anteriorly  CVS: S1 S2 Regular, no murmurs.  Abdomen: soft, BS +, non tender, non distended.  Extremities: no edema, lower extremities warm to touch. Neurologic: Has chronic lower ext weakness-that is unchanged-but not focal Skin: No Rash.     Intake/Output from previous day:  Intake/Output Summary (Last 24 hours) at 07/11/14 1235 Last data filed at 07/11/14 0506  Gross per 24 hour  Intake 1332.5 ml  Output      0 ml  Net 1332.5 ml     LAB RESULTS: CBC  Recent Labs Lab 07/09/14 1734 07/09/14 2148 07/10/14 0605 07/11/14 0400  WBC 12.4* 10.5 7.3 7.1  HGB 10.6* 9.3* 8.5* 8.8*  HCT 32.7* 29.0* 25.9* 26.7*  PLT 267 214 215 215  MCV 82.6 82.4 82.7 83.2  MCH 26.8 26.4 27.2 27.4  MCHC 32.4 32.1 32.8 33.0  RDW 14.6 14.4 14.6 14.5  LYMPHSABS 0.9  --   --   --   MONOABS 0.9  --   --   --   EOSABS 0.0  --   --   --   BASOSABS 0.0  --   --   --     Chemistries   Recent Labs Lab  07/09/14 1734 07/09/14 2148 07/10/14 0605 07/11/14 0400  NA 138  --  139 144  K 3.8  --  3.4* 3.5  CL 100*  --  102 106  CO2 26  --  28 28  GLUCOSE 132*  --  140* 102*  BUN 8  --  8 7  CREATININE 0.60 0.66 0.69 0.65  CALCIUM 9.4  --  8.8* 8.8*  MG  --   --  1.4*  --     CBG:  Recent Labs Lab 07/10/14 2015 07/10/14 2354 07/11/14 0406 07/11/14 0744 07/11/14 1147  GLUCAP 101* 126* 99 116* 187*    GFR Estimated Creatinine Clearance: 64.9 mL/min (by C-G formula based on Cr of 0.65).  Coagulation profile No results for input(s): INR, PROTIME in the last 168 hours.  Cardiac Enzymes No results for input(s): CKMB, TROPONINI, MYOGLOBIN in the last 168 hours.  Invalid input(s): CK  Invalid input(s): POCBNP No results for input(s): DDIMER in  the last 72 hours.  Recent Labs  07/10/14 0605  HGBA1C 9.1*   No results for input(s): CHOL, HDL, LDLCALC, TRIG, CHOLHDL, LDLDIRECT in the last 72 hours.  Recent Labs  07/10/14 0605  TSH 1.715   No results for input(s): VITAMINB12, FOLATE, FERRITIN, TIBC, IRON, RETICCTPCT in the last 72 hours. No results for input(s): LIPASE, AMYLASE in the last 72 hours.  Urine Studies No results for input(s): UHGB, CRYS in the last 72 hours.  Invalid input(s): UACOL, UAPR, USPG, UPH, UTP, UGL, UKET, UBIL, UNIT, UROB, ULEU, UEPI, UWBC, URBC, UBAC, CAST, UCOM, BILUA  MICROBIOLOGY: Recent Results (from the past 240 hour(s))  Clostridium Difficile by PCR     Status: None   Collection Time: 07/02/14 11:07 AM  Result Value Ref Range Status   C difficile by pcr NEGATIVE NEGATIVE Final  Urine culture     Status: None   Collection Time: 07/09/14  6:50 PM  Result Value Ref Range Status   Specimen Description URINE, CATHETERIZED  Final   Special Requests NONE  Final   Colony Count NO GROWTH Performed at [facility]   Final   Culture NO GROWTH Performed at [facility]   Final   Report Status 07/10/2014 FINAL  Final   Culture, blood (routine x 2)     Status: None (Preliminary result)   Collection Time: 07/09/14  9:34 PM  Result Value Ref Range Status   Specimen Description BLOOD LEFT ARM  Final   Special Requests BOTTLES DRAWN AEROBIC AND ANAEROBIC 5ML  Final   Culture   Final           BLOOD CULTURE RECEIVED NO GROWTH TO DATE CULTURE WILL BE HELD FOR 5 DAYS BEFORE ISSUING A FINAL NEGATIVE REPORT Performed at [facility]    Report Status PENDING  Incomplete  Culture, blood (routine x 2)     Status: None (Preliminary result)   Collection Time: 07/09/14  9:48 PM  Result Value Ref Range Status   Specimen Description BLOOD LEFT HAND  Final   Special Requests BOTTLES DRAWN AEROBIC AND ANAEROBIC 5ML  Final   Culture   Final           BLOOD CULTURE RECEIVED NO GROWTH TO DATE CULTURE WILL BE HELD FOR 5 DAYS BEFORE ISSUING A FINAL NEGATIVE REPORT Performed at [facility]    Report Status PENDING  Incomplete    RADIOLOGY STUDIES/RESULTS: Dg Chest 2 View  07/09/2014   CLINICAL DATA:  Productive cough, possible aspiration 4 days ago, diabetes, hypertension  EXAM: CHEST  2 VIEW  COMPARISON:  07/02/2014  FINDINGS: RIGHT arm PICC line tip projects over SVC.  Normal heart size, mediastinal contours and pulmonary vascularity.  Atherosclerotic calcification aorta.  Minimal RIGHT basilar atelectasis.  Lungs otherwise clear.  Improved aeration versus previous study.  No pleural effusion or pneumothorax.  IMPRESSION: Improved aeration versus prior exam.   Electronically Signed   By: Lavonia Dana M.D.   On: 07/09/2014 18:04   Ct Head Wo Contrast  07/09/2014   CLINICAL DATA:  Altered mental status  EXAM: CT HEAD WITHOUT CONTRAST  TECHNIQUE: Contiguous axial images were obtained from the base of the skull through the vertex without intravenous contrast.  COMPARISON:  05/20/2014  FINDINGS: Bony calvarium is intact. No gross soft tissue abnormality is seen. There is been interval improvement in the degree of  opacification within the left maxillary antrum. Diffuse atrophic changes are noted. Mild chronic white matter ischemic  change is seen. No findings to suggest acute hemorrhage, acute infarction or space-occupying mass lesion are noted.  IMPRESSION: Chronic atrophic in ischemic changes without acute abnormality.   Electronically Signed   By: Inez Catalina M.D.   On: 07/09/2014 17:57   Mr Jeri Cos CB Contrast  07/11/2014   CLINICAL DATA:  Acute encephalopathy.  EXAM: MRI HEAD WITHOUT AND WITH CONTRAST  TECHNIQUE: Multiplanar, multiecho pulse sequences of the brain and surrounding structures were obtained without and with intravenous contrast.  CONTRAST:  106mL MULTIHANCE GADOBENATE DIMEGLUMINE 529 MG/ML IV SOLN  COMPARISON:  Head CT 07/09/2014 and MRI 05/23/2011  FINDINGS: There is no evidence of acute infarct, mass, midline shift, or extra-axial fluid collection. Small foci of chronic hemorrhage are noted at the left temporoparietal junction and in the left occipital lobe. There is moderate cerebral and cerebellar atrophy. Periventricular white matter T2 hyperintensities are similar to the prior MRI and nonspecific but compatible with mild chronic small vessel ischemic disease. No abnormal enhancement is identified.  Orbits are unremarkable. Mild left maxillary sinus mucosal thickening and small left larger than right mastoid effusions are noted. Major intracranial vascular flow voids are preserved.  IMPRESSION: 1. No acute intracranial abnormality or mass. 2. Moderate cerebral atrophy and mild chronic small vessel ischemic disease.   Electronically Signed   By: Logan Bores   On: 07/11/2014 09:29   Dg Chest Port 1 View  07/10/2014   CLINICAL DATA:  Pneumonia.  EXAM: PORTABLE CHEST - 1 VIEW  COMPARISON:  07/09/2014.  FINDINGS: Right PICC line stable position. mediastinum and hilar structures normal. Stable cardiomegaly. Diffuse right lung infiltrate. Mild infiltrate atelectasis left lower lobe. Small right pleural  effusion. No pneumothorax.  IMPRESSION: 1. Right PICC line in stable position. 2. Diffuse right lung infiltrate. Mild left lower lobe infiltrate and subsegmental atelectasis . These findings are consistent with pneumonia.   Electronically Signed   By: Clark   On: 07/10/2014 15:36   Dg Chest Port 1 View  07/02/2014   CLINICAL DATA:  Atelectasis.  EXAM: PORTABLE CHEST - 1 VIEW  COMPARISON:  06/30/2014.  FINDINGS: Interim removal of endotracheal tube. Right PICC line in stable position. Mild fullness of the mediastinum noted, this is most likely related to tortuous thoracic aorta and prominent great vessels as well as AP portable technique . Cardiomegaly with mild pulmonary vascular prominence interstitial prominence suggesting mild congestive heart failure. Bibasilar subsegmental atelectasis. Tiny bilateral pleural effusions cannot be excluded. No pneumothorax.  IMPRESSION: 1. Right PICC line in stable position. 2. Cardiomegaly with mild pulmonary vascular prominence and mild interstitial prominence suggesting mild congestive heart failure. Tiny bilateral pleural effusions cannot be excluded. 3. New onset of subsegmental atelectasis both lung bases.   Electronically Signed   By: Marcello Moores  Register   On: 07/02/2014 07:39   Dg Chest Portable 1 View  06/30/2014   CLINICAL DATA:  Endotracheal tube placement. Difficulty breathing. Fever.  EXAM: PORTABLE CHEST - 1 VIEW  COMPARISON:  05/28/2014  FINDINGS: Endotracheal tube tip measures 2.6 cm above the carina. Right PICC line tip overlies the upper SVC region. No pneumothorax. Shallow inspiration. Heart size and pulmonary vascularity are normal. No focal airspace disease or consolidation in the lungs. No blunting of costophrenic angles. Tortuous aorta.  IMPRESSION: Appliances appear to be in satisfactory location. No evidence of active pulmonary disease.   Electronically Signed   By: Lucienne Capers M.D.   On: 06/30/2014 00:46    Oren Binet, MD  [facility] Pager:[phone]  If 7PM-7AM, please contact night-coverage [email or web address] Password [email or web address] 07/11/2014, 12:35 PM   LOS: 2 days

## 2014-07-11 NOTE — Progress Notes (Signed)
ANTIBIOTIC CONSULT NOTE - F/U  Pharmacy Consult for vancomycin, Zosyn Indication: sepsis, suspected aspiration PNA  Allergies  Allergen Reactions  . Morphine And Related Other (See Comments)    hallucination   . Oxycodone Other (See Comments)    hallucination   . Sulfonamide Derivatives Swelling    Patient Measurements: weight 78 kg, height 67 inches Height: 5\' 7"  (170.2 cm) Weight: 169 lb 5 oz (76.8 kg) IBW/kg (Calculated) : 61.6  Vital Signs: Temp: 99.2 F (37.3 C) (05/20 0421) Temp Source: Oral (05/20 0421) BP: 151/76 mmHg (05/20 0421) Pulse Rate: 88 (05/20 0421) Intake/Output from previous day: 05/19 0701 - 05/20 0700 In: 1332.5 [I.V.:882.5; IV Piggyback:450] Out: -  Intake/Output from this shift:    Labs:  Recent Labs  07/09/14 2148 07/10/14 0605 07/11/14 0400  WBC 10.5 7.3 7.1  HGB 9.3* 8.5* 8.8*  PLT 214 215 215  CREATININE 0.66 0.69 0.65   Estimated Creatinine Clearance: 64.9 mL/min (by C-G formula based on Cr of 0.65). No results for input(s): VANCOTROUGH, VANCOPEAK, VANCORANDOM, GENTTROUGH, GENTPEAK, GENTRANDOM, TOBRATROUGH, TOBRAPEAK, TOBRARND, AMIKACINPEAK, AMIKACINTROU, AMIKACIN in the last 72 hours.   Microbiology: Recent Results (from the past 720 hour(s))  Culture, respiratory (NON-Expectorated)     Status: None   Collection Time: 06/30/14 12:03 AM  Result Value Ref Range Status   Specimen Description SPUTUM  Final   Special Requests NONE  Final   Gram Stain   Final    ABUNDANT WBC PRESENT,BOTH PMN AND MONONUCLEAR ABUNDANT SQUAMOUS EPITHELIAL CELLS PRESENT ABUNDANT GRAM POSITIVE COCCI IN PAIRS IN CHAINS ABUNDANT GRAM POSITIVE RODS Gram Stain Report Called to,Read Back By and Verified With: Gram Stain Report Called to,Read Back By and Verified With: DENA.A AT 10:48 ON [id number] BY WEBSP    Culture   Final    ABUNDANT METHICILLIN RESISTANT STAPHYLOCOCCUS AUREUS Note: RIFAMPIN AND GENTAMICIN SHOULD NOT BE USED AS SINGLE DRUGS FOR TREATMENT OF  STAPH INFECTIONS. Performed at [facility]    Report Status 07/03/2014 FINAL  Final   Organism ID, Bacteria METHICILLIN RESISTANT STAPHYLOCOCCUS AUREUS  Final      Susceptibility   Methicillin resistant staphylococcus aureus - MIC*    CLINDAMYCIN >=8 RESISTANT Resistant     ERYTHROMYCIN >=8 RESISTANT Resistant     GENTAMICIN <=0.5 SENSITIVE Sensitive     LEVOFLOXACIN >=8 RESISTANT Resistant     OXACILLIN >=4 RESISTANT Resistant     PENICILLIN >=0.5 RESISTANT Resistant     RIFAMPIN <=0.5 SENSITIVE Sensitive     TRIMETH/SULFA <=10 SENSITIVE Sensitive     VANCOMYCIN 1 SENSITIVE Sensitive     TETRACYCLINE <=1 SENSITIVE Sensitive     * ABUNDANT METHICILLIN RESISTANT STAPHYLOCOCCUS AUREUS  Culture, blood (routine x 2)     Status: None   Collection Time: 06/30/14 12:10 AM  Result Value Ref Range Status   Specimen Description BLOOD LAC  Final   Special Requests BOTTLES DRAWN AEROBIC AND ANAEROBIC 5ML  Final   Culture   Final    STAPHYLOCOCCUS SPECIES (COAGULASE NEGATIVE) Note: THE SIGNIFICANCE OF ISOLATING THIS ORGANISM FROM A SINGLE SET OF BLOOD CULTURES WHEN MULTIPLE SETS ARE DRAWN IS UNCERTAIN. PLEASE NOTIFY THE MICROBIOLOGY DEPARTMENT WITHIN ONE WEEK IF SPECIATION AND SENSITIVITIES ARE REQUIRED. Note: Gram Stain Report Called to,Read Back By and Verified With: [facility] AT 6:41 A.M. ON 07/01/2014 WARRB Performed at [facility]    Report Status 07/02/2014 FINAL  Final  Culture, blood (routine x 2)     Status: None  Collection Time: 06/30/14 12:20 AM  Result Value Ref Range Status   Specimen Description BLOOD RIGHT HAND  Final   Special Requests BOTTLES DRAWN AEROBIC AND ANAEROBIC 5ML  Final   Culture   Final    NO GROWTH 5 DAYS Performed at [facility]    Report Status 07/06/2014 FINAL  Final  MRSA PCR Screening     Status: Abnormal   Collection Time: 06/30/14  5:30 AM  Result Value Ref Range Status   MRSA by PCR POSITIVE (A) NEGATIVE Final     Comment:        The GeneXpert MRSA Assay (FDA approved for NASAL specimens only), is one component of a comprehensive MRSA colonization surveillance program. It is not intended to diagnose MRSA infection nor to guide or monitor treatment for MRSA infections. RESULT CALLED TO, READ BACK BY AND VERIFIED WITH: [place] RN AT 0705 ON 05.09.16 BY SHUEA   Urine culture     Status: None   Collection Time: 06/30/14  6:02 AM  Result Value Ref Range Status   Specimen Description URINE, CATHETERIZED  Final   Special Requests NONE  Final   Colony Count   Final    75,000 COLONIES/ML Performed at [facility]    Culture YEAST Performed at [facility]   Final   Report Status 07/01/2014 FINAL  Final  Culture, respiratory (tracheal aspirate)     Status: None   Collection Time: 06/30/14 11:17 AM  Result Value Ref Range Status   Specimen Description TRACHEAL ASPIRATE  Final   Special Requests NONE  Final   Gram Stain   Final    ABUNDANT WBC PRESENT, PREDOMINANTLY PMN RARE SQUAMOUS EPITHELIAL CELLS PRESENT FEW GRAM POSITIVE COCCI IN PAIRS FEW GRAM VARIABLE ROD Gram Stain Report Called to,Read Back By and Verified With: Gram Stain Report Called to,Read Back By and Verified With: KATIE.V AT [place] ON [id number] BY WEBSP    Culture   Final    ABUNDANT METHICILLIN RESISTANT STAPHYLOCOCCUS AUREUS Note: RIFAMPIN AND GENTAMICIN SHOULD NOT BE USED AS SINGLE DRUGS FOR TREATMENT OF STAPH INFECTIONS. MODERATE CANDIDA ALBICANS Performed at [facility]    Report Status 07/04/2014 FINAL  Final   Organism ID, Bacteria METHICILLIN RESISTANT STAPHYLOCOCCUS AUREUS  Final      Susceptibility   Methicillin resistant staphylococcus aureus - MIC*    CLINDAMYCIN >=8 RESISTANT Resistant     ERYTHROMYCIN >=8 RESISTANT Resistant     GENTAMICIN <=0.5 SENSITIVE Sensitive     LEVOFLOXACIN >=8 RESISTANT Resistant     OXACILLIN >=4 RESISTANT Resistant     PENICILLIN >=0.5 RESISTANT Resistant      RIFAMPIN <=0.5 SENSITIVE Sensitive     TRIMETH/SULFA <=10 SENSITIVE Sensitive     VANCOMYCIN <=0.5 SENSITIVE Sensitive     TETRACYCLINE <=1 SENSITIVE Sensitive     * ABUNDANT METHICILLIN RESISTANT STAPHYLOCOCCUS AUREUS  Clostridium Difficile by PCR     Status: None   Collection Time: 07/02/14 11:07 AM  Result Value Ref Range Status   C difficile by pcr NEGATIVE NEGATIVE Final  Urine culture     Status: None   Collection Time: 07/09/14  6:50 PM  Result Value Ref Range Status   Specimen Description URINE, CATHETERIZED  Final   Special Requests NONE  Final   Colony Count NO GROWTH Performed at [facility]   Final   Culture NO GROWTH Performed at [facility]   Final   Report Status 07/10/2014 FINAL  Final  Culture, blood (routine x 2)     Status: None (Preliminary result)   Collection Time: 07/09/14  9:34 PM  Result Value Ref Range Status   Specimen Description BLOOD LEFT ARM  Final   Special Requests BOTTLES DRAWN AEROBIC AND ANAEROBIC 5ML  Final   Culture   Final           BLOOD CULTURE RECEIVED NO GROWTH TO DATE CULTURE WILL BE HELD FOR 5 DAYS BEFORE ISSUING A FINAL NEGATIVE REPORT Performed at [facility]    Report Status PENDING  Incomplete  Culture, blood (routine x 2)     Status: None (Preliminary result)   Collection Time: 07/09/14  9:48 PM  Result Value Ref Range Status   Specimen Description BLOOD LEFT HAND  Final   Special Requests BOTTLES DRAWN AEROBIC AND ANAEROBIC 5ML  Final   Culture   Final           BLOOD CULTURE RECEIVED NO GROWTH TO DATE CULTURE WILL BE HELD FOR 5 DAYS BEFORE ISSUING A FINAL NEGATIVE REPORT Performed at [facility]    Report Status PENDING  Incomplete    Medical History: Past Medical History  Diagnosis Date  . Anxiety   . Dyspnea   . Diabetes mellitus   . Osteoarthritis   . Hypercholesteremia   . Depression   . RLS (restless legs syndrome)   . Stress   . PONV (postoperative nausea and  vomiting)   . Headache(784.0)   . HTN (hypertension)     dr Johnsie Cancel   5/19 >> vancomycin >> 5/20 >> Zosyn >>  5/18 blood x 2:  NGtd 5/18 urine: NGF Sputum: ordered but not yet received  Assessment: Patient is a 74 y.o F transferred to [facility] after the staff at [facility] suspected patient aspirated at the facility.  Began empiric broad-spectrum abx with vancomycin and Zosyn for suspected sepsis.  Note that sputum culture from 06/30/14 grew MRSA; patient completed a course of Zyvox on 07/08/14.  Today, 07/11/2014: D2 vancomycin 750 mg IV q12h D3 Zosyn 3.375 grams IV q8h (each dose over 4 hrs) Tm 99.2 WBC improved, WNL SCr WNL, stable   Goal of Therapy:  Vancomycin trough level 15-20 mcg/ml  Appropriate abx dosing for indication and renal function.  Plan:  1. Continue present vancomycin and Zosyn dosages 2. Check vancomycin trough before tonight's 2230 dose 3. Follow cultures, renal function, clinical course.  Clayburn Pert, PharmD, BCPS Pager: [phone] 07/11/2014  2:52 PM

## 2014-07-11 NOTE — Clinical Social Work Note (Signed)
Clinical Social Work Assessment  Patient Details  Name: [name] MRN: [id number] Date of Birth: 1940-07-07  Date of referral:  07/11/14               Reason for consult:  Facility Placement                Permission sought to share information with:  Facility Sport and exercise psychologist, Family Supports Permission granted to share information::  Yes, Verbal Permission Granted  Name::     Idolina Primer, Daughter  Agency::  [facility]  Relationship::  Daughter  Contact Information:  [phone]  Housing/Transportation Living arrangements for the past 2 months:  [place] of Information:  Adult Children Patient Interpreter Needed:  None Criminal Activity/Legal Involvement Pertinent to Current Situation/Hospitalization:  No - Comment as needed Significant Relationships:  Adult Children, Spouse Lives with:  Facility Resident Do you feel safe going back to the place where you live?  Yes Need for family participation in patient care:  Yes (Comment) (Pt oriented to self only)  Care giving concerns:  None at this time. Pt is supported by spouse and adult daughter. Pt living in SNF currently and plans to stay there.   Social Worker assessment / plan:  CSW met with Pt and Pt daughter. Pt unable to participate in assessment as she is only oriented to self and is lethargic from medication. However, Pt more alert today than yesterday. Pt has been diagnosed with dementia and MD note reports suspected sundowners. Pt daughter, when met with yesterday, was tearful as GI team has introduced goals of care to family related to continued and chronic infection. Today, Pt daughter less tearful and participated in assessment. Pt was at [facility] for short-term rehab prior to admission. CSW discussed Pt's family desire for Pt to return there. Pt daughter adamant that Pt should return to [facility]. CSW explained that CSW will facilitate discharge for Pt to  return to facility.   CSW has spoken with Narda Rutherford at [facility] who is aware that Pt will return to [facility]. CSW faxed updated clinical information and FL2 to facility.  Employment status:  Retired Forensic scientist:  Medicare PT Recommendations:  Not assessed at this time [place] / Referral to community resources:  [place]  Patient/Family's Response to care:  Pt daughter agreeable to have Pt return to SNF ([facility]).  Patient/Family's Understanding of and Emotional Response to Diagnosis, Current Treatment, and Prognosis:  Pt daughter expressed that she is under significant stress as she is the only child in the family and all care giving responsibilities fall on her. She describes being in and out of the hospital over the last month with Pt chronic infections. Pt husband also lives with the daughter, but per daughter is in much better health and is able to complete ADLs in the home. Pt daughter described feeling sad as she feels her mother's health is declining and that tough decisions will need to be made soon. Pt expressed that family is adamant about Pt returning to [facility] and is pleased with services there.   Emotional Assessment Appearance:  Appears stated age Attitude/Demeanor/Rapport:  Unable to Assess Affect (typically observed):  Unable to Assess Orientation:  Oriented to Self, Fluctuating Orientation (Suspected and/or reported Sundowners) Alcohol / Substance use:  Not Applicable Psych involvement (Current and /or in the community):  No (Comment)  Discharge Needs  Concerns to be addressed:  Discharge Planning Concerns Readmission within the last 30 days:  Yes Current  discharge risk:  Physical Impairment, Dependent with Mobility Barriers to Discharge:  Continued Medical Work up   Bo Mcclintock, LCSW 07/11/2014, 11:11 AM

## 2014-07-12 ENCOUNTER — Inpatient Hospital Stay (HOSPITAL_COMMUNITY): Payer: Medicare Other

## 2014-07-12 DIAGNOSIS — R1310 Dysphagia, unspecified: Secondary | ICD-10-CM

## 2014-07-12 LAB — GLUCOSE, CAPILLARY
GLUCOSE-CAPILLARY: 180 mg/dL — AB (ref 65–99)
GLUCOSE-CAPILLARY: 92 mg/dL (ref 65–99)
Glucose-Capillary: 109 mg/dL — ABNORMAL HIGH (ref 65–99)
Glucose-Capillary: 154 mg/dL — ABNORMAL HIGH (ref 65–99)
Glucose-Capillary: 194 mg/dL — ABNORMAL HIGH (ref 65–99)
Glucose-Capillary: 194 mg/dL — ABNORMAL HIGH (ref 65–99)
Glucose-Capillary: 201 mg/dL — ABNORMAL HIGH (ref 65–99)

## 2014-07-12 MED ORDER — PANTOPRAZOLE SODIUM 40 MG PO TBEC
40.0000 mg | DELAYED_RELEASE_TABLET | Freq: Every day | ORAL | Status: DC
  Administered 2014-07-12 – 2014-07-14 (×3): 40 mg via ORAL
  Filled 2014-07-12 (×2): qty 1

## 2014-07-12 MED ORDER — MIRTAZAPINE 7.5 MG PO TABS
7.5000 mg | ORAL_TABLET | Freq: Every day | ORAL | Status: DC
  Administered 2014-07-12 – 2014-07-13 (×2): 7.5 mg via ORAL
  Filled 2014-07-12 (×3): qty 1

## 2014-07-12 MED ORDER — LOSARTAN POTASSIUM 50 MG PO TABS
100.0000 mg | ORAL_TABLET | Freq: Every day | ORAL | Status: DC
  Administered 2014-07-12 – 2014-07-14 (×3): 100 mg via ORAL
  Filled 2014-07-12 (×3): qty 2

## 2014-07-12 NOTE — Care Management Note (Signed)
Medicare Important Message given? YES  Date Medicare IM given:07/12/14  Medicare IM given by: Venita Sheffield RN

## 2014-07-12 NOTE — Procedures (Signed)
Objective Swallowing Evaluation:  (MBS)  Patient Details  Name: [name] MRN: [id number] Date of Birth: 25-Nov-1940  Today's Date: 07/12/2014 Time: SLP Start Time (ACUTE ONLY): 0908-SLP Stop Time (ACUTE ONLY): 0926 SLP Time Calculation (min) (ACUTE ONLY): 18 min  Past Medical History:  Past Medical History  Diagnosis Date  . Anxiety   . Dyspnea   . Diabetes mellitus   . Osteoarthritis   . Hypercholesteremia   . Depression   . RLS (restless legs syndrome)   . Stress   . PONV (postoperative nausea and vomiting)   . Headache(784.0)   . HTN (hypertension)     dr Johnsie Cancel   Past Surgical History:  Past Surgical History  Procedure Laterality Date  . Colonoscopy    . Abdominal hysterectomy    . Lumbar laminectomy/decompression microdiscectomy N/A 04/18/2012    Procedure: LUMBAR LAMINECTOMY/DECOMPRESSION MICRODISCECTOMY 3 LEVELS;  Surgeon: Winfield Cunas, MD;  Location: [facility];  Service: Neurosurgery;  Laterality: N/A;  Lumbar three-four, lumbar four-five, lumbar five-sacral one decompression. Synovial cyst resection lumbar four-five  . Joint replacement    . Left forearm fracture with orif Left   . Replacement total knee      right   HPI:  Other Pertinent Information: [name] is a 74 y.o. female who had a recent hospitalization complicated by severe sepsis due to UTI resulting in encephalopathy and acute renal failure as well as acute respiratory failure requiring intubation. she was discharged to nursing facility on May 13 Daughter states that while in at the nursing facility, patient  had a  swallow evaluation showed evidence of significant aspiration. Family states that they were told that all the food that is swallowed goes directly into the lungs. At nursing home facility they made nothing by mouth. Per family she was not having history of coughing and shortness of breath with fever since her discharge. Her GI doctor has been called and recommended transfer to  emergency department. ER physician spoke to Dr. Benson Norway who is on-call who states that GI will consult and patient may need endoscopy to clarify significant dysphagia. MBS recommeded following BSE.  No Data Recorded  Assessment / Plan / Recommendation CHL IP CLINICAL IMPRESSIONS 07/12/2014  Therapy Diagnosis (None)  Clinical Impression Mild oral dysphagia with delayed transit and moderate-severe sensorimotor pharyngeal dysphagia. Decreased sensation resulted in valleculae initation site of swallow and maximum vallecular and pyriform sinus residue. Pt unable to produce volitional swallows provided max tactile and verbal assist with demonstration and dry spoon trials. Silent aspiration of honey thick present and risk of aspiration from residue is high. Recommendation based on results are NPO however discussed with MD and family desires to accept risk for comfort feeds. SLP will follow up briefly for educatio re: safest swallow precautions (upright position, no straws, 2 swallows using dry spoon, sit up one hour after meals).       CHL IP TREATMENT RECOMMENDATION 07/12/2014  Treatment Recommendations Therapy as outlined in treatment plan below     CHL IP DIET RECOMMENDATION 07/12/2014  SLP Diet Recommendations Dysphagia 1 (Puree)  Liquid Administration via (None)  Medication Administration Crushed with puree  Compensations Slow rate;Small sips/bites;Multiple dry swallows after each bite/sip;Clear throat intermittently  Postural Changes and/or Swallow Maneuvers (None)     CHL IP OTHER RECOMMENDATIONS 07/12/2014  Recommended Consults (None)  Oral Care Recommendations Oral care BID  Other Recommendations (None)     CHL IP FOLLOW UP RECOMMENDATIONS 07/11/2014  Follow up Recommendations Skilled Nursing  facility     CHL IP FREQUENCY AND DURATION 07/12/2014  Speech Therapy Frequency (ACUTE ONLY) min 2x/week  Treatment Duration 2 weeks     Pertinent Vitals/Pain none    SLP Swallow Goals No flowsheet  data found.  No flowsheet data found.    CHL IP REASON FOR REFERRAL 07/12/2014  Reason for Referral Objectively evaluate swallowing function     CHL IP ORAL PHASE 07/12/2014  Lips (None)  Tongue (None)  Mucous membranes (None)  Nutritional status (None)  Other (None)  Oxygen therapy (None)  Oral Phase Impaired  Oral - Pudding Teaspoon (None)  Oral - Pudding Cup (None)  Oral - Honey Teaspoon (None)  Oral - Honey Cup (None)  Oral - Honey Syringe (None)  Oral - Nectar Teaspoon (None)  Oral - Nectar Cup (None)  Oral - Nectar Straw (None)  Oral - Nectar Syringe (None)  Oral - Ice Chips (None)  Oral - Thin Teaspoon (None)  Oral - Thin Cup (None)  Oral - Thin Straw (None)  Oral - Thin Syringe (None)  Oral - Puree (None)  Oral - Mechanical Soft (None)  Oral - Regular (None)  Oral - Multi-consistency (None)  Oral - Pill (None)  Oral Phase - Comment (None)      CHL IP PHARYNGEAL PHASE 07/12/2014  Pharyngeal Phase Impaired  Pharyngeal - Pudding Teaspoon (None)  Penetration/Aspiration details (pudding teaspoon) (None)  Pharyngeal - Pudding Cup (None)  Penetration/Aspiration details (pudding cup) (None)  Pharyngeal - Honey Teaspoon (None)  Penetration/Aspiration details (honey teaspoon) (None)  Pharyngeal - Honey Cup (None)  Penetration/Aspiration details (honey cup) (None)  Pharyngeal - Honey Syringe (None)  Penetration/Aspiration details (honey syringe) (None)  Pharyngeal - Nectar Teaspoon (None)  Penetration/Aspiration details (nectar teaspoon) (None)  Pharyngeal - Nectar Cup (None)  Penetration/Aspiration details (nectar cup) (None)  Pharyngeal - Nectar Straw (None)  Penetration/Aspiration details (nectar straw) (None)  Pharyngeal - Nectar Syringe (None)  Penetration/Aspiration details (nectar syringe) (None)  Pharyngeal - Ice Chips (None)  Penetration/Aspiration details (ice chips) (None)  Pharyngeal - Thin Teaspoon (None)  Penetration/Aspiration details (thin  teaspoon) (None)  Pharyngeal - Thin Cup (None)  Penetration/Aspiration details (thin cup) (None)  Pharyngeal - Thin Straw (None)  Penetration/Aspiration details (thin straw) (None)  Pharyngeal - Thin Syringe (None)  Penetration/Aspiration details (thin syringe') (None)  Pharyngeal - Puree (None)  Penetration/Aspiration details (puree) (None)  Pharyngeal - Mechanical Soft (None)  Penetration/Aspiration details (mechanical soft) (None)  Pharyngeal - Regular (None)  Penetration/Aspiration details (regular) (None)  Pharyngeal - Multi-consistency (None)  Penetration/Aspiration details (multi-consistency) (None)  Pharyngeal - Pill (None)  Penetration/Aspiration details (pill) (None)  Pharyngeal Comment (None)      CHL IP CERVICAL ESOPHAGEAL PHASE 07/12/2014  Cervical Esophageal Phase WFL  Pudding Teaspoon (None)  Pudding Cup (None)  Honey Teaspoon (None)  Honey Cup (None)  Honey Straw (None)  Nectar Teaspoon (None)  Nectar Cup (None)  Nectar Straw (None)  Nectar Sippy Cup (None)  Thin Teaspoon (None)  Thin Cup (None)  Thin Straw (None)  Thin Sippy Cup (None)  Cervical Esophageal Comment (None)    No flowsheet data found.         Houston Siren 07/12/2014, 10:31 AM  Orbie Pyo Colvin Caroli.[employer] [phone]

## 2014-07-12 NOTE — Evaluation (Signed)
Physical Therapy Evaluation Patient Details Name: [name] MRN: [id number] DOB: 01/27/1941 Today's Date: 07/12/2014   History of Present Illness  74 year old female who was recently hospitalized for severe sepsis as a result of an UTI. She also developed encephalopathy, ARF, and respiratory failure with resultant intubation. Admitted 5/18 for Acute metabolic encephalopathy and suspected aspiration pneumonia.  Clinical Impression  Pt admitted with above diagnosis. Pt currently with functional limitations due to the deficits listed below (see PT Problem List).  Pt will benefit from skilled PT to increase their independence and safety with mobility to allow discharge to the venue listed below.   Pt requiring total assist +2 for bed mobility at this time.  Recommend 24/7 care upon d/c.  Per chart, possible plans for d/c with hospice services.  No family present today.  Will continue to check back on pt and mobilize per pt and family wishes.     Follow Up Recommendations SNF;Supervision/Assistance - 24 hour    Equipment Recommendations  None recommended by PT    Recommendations for Other Services       Precautions / Restrictions Precautions Precautions: Fall      Mobility  Bed Mobility Overal bed mobility: Needs Assistance;+2 for physical assistance Bed Mobility: Rolling;Supine to Sit;Sit to Supine Rolling: +2 for physical assistance;Total assist   Supine to sit: Total assist;+2 for physical assistance Sit to supine: Total assist;+2 for physical assistance   General bed mobility comments: pt initiates movement and then no longer would self assist requiring total assist to complete mobility, utilized bed pads for positioning  Transfers                    Ambulation/Gait                Stairs            Wheelchair Mobility    Modified Rankin (Stroke Patients Only)       Balance Overall balance assessment: Needs assistance Sitting-balance  support: Bilateral upper extremity supported;Feet supported Sitting balance-Leahy Scale: Poor Sitting balance - Comments: pt requires UE support, able to maintain trunk upright without external support for approx 10 sec then fatigues and requires assist for posterior lean, sat EOB for 3 min with holding upright position                                     Pertinent Vitals/Pain Pain Assessment: No/denies pain    Home Living Family/patient expects to be discharged to:: Skilled nursing facility                 Additional Comments: has been living with daughter, upon last admission was discharged to SNF    Prior Function Level of Independence: Needs assistance   Gait / Transfers Assistance Needed: Per last admission note: pt nonambulatory in SNF but up until 4-6 weeks ago was ambulating using RW           Hand Dominance        Extremity/Trunk Assessment   Upper Extremity Assessment: Generalized weakness           Lower Extremity Assessment: Generalized weakness         Communication   Communication: No difficulties  Cognition Arousal/Alertness: Awake/alert (easily awakens however keeps eyes closed much of the time if not talking) Behavior During Therapy: Flat affect Overall Cognitive Status: No family/caregiver present to determine  baseline cognitive functioning (hx of dementia)                      General Comments      Exercises        Assessment/Plan    PT Assessment Patient needs continued PT services  PT Diagnosis Difficulty walking;Generalized weakness   PT Problem List Decreased strength;Decreased activity tolerance;Decreased balance;Decreased mobility;Decreased knowledge of use of DME;Decreased cognition;Decreased safety awareness;Obesity;Pain  PT Treatment Interventions DME instruction;Gait training;Functional mobility training;Patient/family education;Therapeutic activities;Therapeutic exercise;Balance  training;Wheelchair mobility training   PT Goals (Current goals can be found in the Care Plan section) Acute Rehab PT Goals PT Goal Formulation: Patient unable to participate in goal setting Time For Goal Achievement: 07/26/14 Potential to Achieve Goals: Fair    Frequency Min 2X/week   Barriers to discharge        Co-evaluation               End of Session   Activity Tolerance: Patient limited by fatigue Patient left: with call bell/phone within reach;in bed;with bed alarm set Nurse Communication: Mobility status         Time: 2025-4270 PT Time Calculation (min) (ACUTE ONLY): 14 min   Charges:   PT Evaluation $Initial PT Evaluation Tier I: 1 Procedure     PT G Codes:        Zander Ingham,KATHrine E 07/12/2014, 4:52 PM Carmelia Bake, PT, DPT 07/12/2014 Pager: [phone]

## 2014-07-12 NOTE — Progress Notes (Signed)
PATIENT DETAILS Name: [name] Age: 74 y.o. Sex: female Date of Birth: [birth year] Admit Date: 07/09/2014 Admitting Physician Toy Baker, MD WCB:[id number],VOHYWV G, MD  Subjective: Talking with family-eating breakfast.   Assessment/Plan: Active Problems:   Acute metabolic encephalopathy: Suspect secondary to dehydration and aspiration pneumonia. CT head negative for acute abnormalities. MRI brain negative for CVA. Significantly improved. Continue IV antibiotics.   Suspected aspiration pneumonia: Initial chest x-ray negative for pneumonia, repeat chest x-ray post-hydration suggests right lower lobe infiltrate highly suspicious for aspiration pneumonia. Started on vancomycin and Zosyn on admission, since improved, cultures negative, stop Vanco-continue with Zosyn. Plans to taper to Augmentin on discharge. Clinically improved, mentation back to baseline.    Sepsis: Secondary to above.  Sepsis physiology has resolved with IV antibiotics and IV fluids. Blood cultures negative so far.     Dysphagia: Suspect chronic dysphagia at baseline-apparently was on a dysphagia 3 diet prior to her last discharge. Suspect worsening dysphagia secondary to acute illness/progression of dementia. MRI brain negative for CVA. Seen by speech therapy, subsequently underwent a modified barium swallow which showed significant aspiration, have discussed with family on numerous occasions throughout this hospital stay-they do not desire any aggressive care-do not desire PEG tube-hence will cautiously continue with a dysphagia 1 diet. Daughter/family fully aware of risks of aspiration and accepting all risks. Will need palliative care follow-up once discharged to SNF.    Hypertension: Continue metoprolol, resume losartan. Follow BP and adjust accordingly.     Diabetes: Continue Levemir 5 units and SSI. CBGs stable.Follow CBGs closely and adjust accordingly.    Stage II decubitus ulcer:  Present prior to admission. Wound care following.    Dementia: Mental status at baseline.     Palliative care: 74 year old black female with a history of dementia-nonambulatory-bed to wheelchair bound-who recently had septic shock along with respiratory failure requiring mechanical intubation. She was discharged from this facility on 07/04/14. Per daughter she was in good state of health for 2 days, then started becoming very lethargic. Subsequently readmitted to this facility on 5/18 with worsening encephalopathy from sepsis/aspiration pneumonia. Multiple discussions held with family during this hospital stay, family aware that patient has poor overall prognoses. DO NOT RESUSCITATE in place. After much discussion with family, family at this time does not desire aggressive care, family also does not desire PEG tube. Plans are to discharge to SNF with palliative care follow-up.   Disposition: Remain inpatient-Suspect back to SNF n 5/23  Antimicrobial agents  See below  Anti-infectives    Start     Dose/Rate Route Frequency Ordered Stop   07/11/14 2300  vancomycin (VANCOCIN) IVPB 1000 mg/200 mL premix     1,000 mg 200 mL/hr over 60 Minutes Intravenous Every 12 hours 07/11/14 2254     07/09/14 2230  vancomycin (VANCOCIN) IVPB 750 mg/150 ml premix  Status:  Discontinued     750 mg 150 mL/hr over 60 Minutes Intravenous Every 12 hours 07/09/14 2212 07/11/14 2254   07/09/14 2200  piperacillin-tazobactam (ZOSYN) IVPB 3.375 g     3.375 g 12.5 mL/hr over 240 Minutes Intravenous 3 times per day 07/09/14 2150        DVT Prophylaxis: Prophylactic Lovenox   Code Status: Full code  Family Communication Daughter/spouse and other family members  at bedside  Procedures: None  CONSULTS:  None  Time spent 25 minutes-Greater than 50% of this time was spent in counseling,  explanation of diagnosis, planning of further management, and coordination of care.  MEDICATIONS: Scheduled Meds: .  aspirin  81 mg Oral Daily  . collagenase   Topical Daily  . enoxaparin (LOVENOX) injection  40 mg Subcutaneous Q24H  . insulin aspart  0-9 Units Subcutaneous 6 times per day  . insulin detemir  5 Units Subcutaneous QHS  . metoprolol succinate  50 mg Oral Daily  . piperacillin-tazobactam (ZOSYN)  IV  3.375 g Intravenous 3 times per day  . pramipexole  0.375 mg Oral TID  . sodium chloride  3 mL Intravenous Q12H  . vancomycin  1,000 mg Intravenous Q12H   Continuous Infusions: . dextrose 5 % and 0.9% NaCl 10 mL/hr at 07/11/14 1418   PRN Meds:.acetaminophen, acetaminophen **OR** acetaminophen, albuterol, bisacodyl, labetalol, LORazepam, morphine injection, ondansetron **OR** ondansetron (ZOFRAN) IV, RESOURCE THICKENUP CLEAR, sodium chloride    PHYSICAL EXAM: Vital signs in last 24 hours: Filed Vitals:   07/10/14 2021 07/11/14 0421 07/11/14 2113 07/12/14 0618  BP: 146/67 151/76 173/96 153/88  Pulse: 82 88 77 68  Temp: 99.1 F (37.3 C) 99.2 F (37.3 C) 99.2 F (37.3 C) 98.4 F (36.9 C)  TempSrc: Oral Oral Oral Oral  Resp: 18 20 18 18   Height:      Weight:      SpO2: 97% 94% 100% 96%    Weight change:  Filed Weights   07/09/14 2210 07/10/14 1352  Weight: 76.8 kg (169 lb 5 oz) 76.8 kg (169 lb 5 oz)   Body mass index is 26.51 kg/(m^2).   Gen Exam: Awake and alert-speaking. Follows commands Neck: Supple,  Chest: B/L Clear-anteriorly  CVS: S1 S2 Regular, no murmurs.  Abdomen: soft, BS +, non tender, non distended.  Extremities: no edema, lower extremities warm to touch. Neurologic: Has chronic lower ext weakness-that is unchanged-but not focal Skin: No Rash.     Intake/Output from previous day: No intake or output data in the 24 hours ending 07/12/14 1312   LAB RESULTS: CBC  Recent Labs Lab 07/09/14 1734 07/09/14 2148 07/10/14 0605 07/11/14 0400  WBC 12.4* 10.5 7.3 7.1  HGB 10.6* 9.3* 8.5* 8.8*  HCT 32.7* 29.0* 25.9* 26.7*  PLT 267 214 215 215  MCV 82.6 82.4  82.7 83.2  MCH 26.8 26.4 27.2 27.4  MCHC 32.4 32.1 32.8 33.0  RDW 14.6 14.4 14.6 14.5  LYMPHSABS 0.9  --   --   --   MONOABS 0.9  --   --   --   EOSABS 0.0  --   --   --   BASOSABS 0.0  --   --   --     Chemistries   Recent Labs Lab 07/09/14 1734 07/09/14 2148 07/10/14 0605 07/11/14 0400  NA 138  --  139 144  K 3.8  --  3.4* 3.5  CL 100*  --  102 106  CO2 26  --  28 28  GLUCOSE 132*  --  140* 102*  BUN 8  --  8 7  CREATININE 0.60 0.66 0.69 0.65  CALCIUM 9.4  --  8.8* 8.8*  MG  --   --  1.4*  --     CBG:  Recent Labs Lab 07/11/14 1947 07/12/14 0041 07/12/14 0422 07/12/14 0740 07/12/14 1140  GLUCAP 182* 154* 109* 92 194*    GFR Estimated Creatinine Clearance: 64.9 mL/min (by C-G formula based on Cr of 0.65).  Coagulation profile No results for input(s): INR, PROTIME in the last 168  hours.  Cardiac Enzymes No results for input(s): CKMB, TROPONINI, MYOGLOBIN in the last 168 hours.  Invalid input(s): CK  Invalid input(s): POCBNP No results for input(s): DDIMER in the last 72 hours.  Recent Labs  07/10/14 0605  HGBA1C 9.1*   No results for input(s): CHOL, HDL, LDLCALC, TRIG, CHOLHDL, LDLDIRECT in the last 72 hours.  Recent Labs  07/10/14 0605  TSH 1.715   No results for input(s): VITAMINB12, FOLATE, FERRITIN, TIBC, IRON, RETICCTPCT in the last 72 hours. No results for input(s): LIPASE, AMYLASE in the last 72 hours.  Urine Studies No results for input(s): UHGB, CRYS in the last 72 hours.  Invalid input(s): UACOL, UAPR, USPG, UPH, UTP, UGL, UKET, UBIL, UNIT, UROB, ULEU, UEPI, UWBC, URBC, UBAC, CAST, UCOM, BILUA  MICROBIOLOGY: Recent Results (from the past 240 hour(s))  Urine culture     Status: None   Collection Time: 07/09/14  6:50 PM  Result Value Ref Range Status   Specimen Description URINE, CATHETERIZED  Final   Special Requests NONE  Final   Colony Count NO GROWTH Performed at [facility]   Final   Culture NO  GROWTH Performed at [facility]   Final   Report Status 07/10/2014 FINAL  Final  Culture, blood (routine x 2)     Status: None (Preliminary result)   Collection Time: 07/09/14  9:34 PM  Result Value Ref Range Status   Specimen Description BLOOD LEFT ARM  Final   Special Requests BOTTLES DRAWN AEROBIC AND ANAEROBIC 5ML  Final   Culture   Final           BLOOD CULTURE RECEIVED NO GROWTH TO DATE CULTURE WILL BE HELD FOR 5 DAYS BEFORE ISSUING A FINAL NEGATIVE REPORT Performed at [facility]    Report Status PENDING  Incomplete  Culture, blood (routine x 2)     Status: None (Preliminary result)   Collection Time: 07/09/14  9:48 PM  Result Value Ref Range Status   Specimen Description BLOOD LEFT HAND  Final   Special Requests BOTTLES DRAWN AEROBIC AND ANAEROBIC 5ML  Final   Culture   Final           BLOOD CULTURE RECEIVED NO GROWTH TO DATE CULTURE WILL BE HELD FOR 5 DAYS BEFORE ISSUING A FINAL NEGATIVE REPORT Performed at [facility]    Report Status PENDING  Incomplete    RADIOLOGY STUDIES/RESULTS: Dg Chest 2 View  07/09/2014   CLINICAL DATA:  Productive cough, possible aspiration 4 days ago, diabetes, hypertension  EXAM: CHEST  2 VIEW  COMPARISON:  07/02/2014  FINDINGS: RIGHT arm PICC line tip projects over SVC.  Normal heart size, mediastinal contours and pulmonary vascularity.  Atherosclerotic calcification aorta.  Minimal RIGHT basilar atelectasis.  Lungs otherwise clear.  Improved aeration versus previous study.  No pleural effusion or pneumothorax.  IMPRESSION: Improved aeration versus prior exam.   Electronically Signed   By: Lavonia Dana M.D.   On: 07/09/2014 18:04   Ct Head Wo Contrast  07/09/2014   CLINICAL DATA:  Altered mental status  EXAM: CT HEAD WITHOUT CONTRAST  TECHNIQUE: Contiguous axial images were obtained from the base of the skull through the vertex without intravenous contrast.  COMPARISON:  05/20/2014  FINDINGS: Bony calvarium is intact. No  gross soft tissue abnormality is seen. There is been interval improvement in the degree of opacification within the left maxillary antrum. Diffuse atrophic changes are noted. Mild chronic white matter ischemic change is seen.  No findings to suggest acute hemorrhage, acute infarction or space-occupying mass lesion are noted.  IMPRESSION: Chronic atrophic in ischemic changes without acute abnormality.   Electronically Signed   By: Inez Catalina M.D.   On: 07/09/2014 17:57   Mr Jeri Cos PO Contrast  07/11/2014   CLINICAL DATA:  Acute encephalopathy.  EXAM: MRI HEAD WITHOUT AND WITH CONTRAST  TECHNIQUE: Multiplanar, multiecho pulse sequences of the brain and surrounding structures were obtained without and with intravenous contrast.  CONTRAST:  90mL MULTIHANCE GADOBENATE DIMEGLUMINE 529 MG/ML IV SOLN  COMPARISON:  Head CT 07/09/2014 and MRI 05/23/2011  FINDINGS: There is no evidence of acute infarct, mass, midline shift, or extra-axial fluid collection. Small foci of chronic hemorrhage are noted at the left temporoparietal junction and in the left occipital lobe. There is moderate cerebral and cerebellar atrophy. Periventricular white matter T2 hyperintensities are similar to the prior MRI and nonspecific but compatible with mild chronic small vessel ischemic disease. No abnormal enhancement is identified.  Orbits are unremarkable. Mild left maxillary sinus mucosal thickening and small left larger than right mastoid effusions are noted. Major intracranial vascular flow voids are preserved.  IMPRESSION: 1. No acute intracranial abnormality or mass. 2. Moderate cerebral atrophy and mild chronic small vessel ischemic disease.   Electronically Signed   By: Logan Bores   On: 07/11/2014 09:29   Dg Chest Port 1 View  07/10/2014   CLINICAL DATA:  Pneumonia.  EXAM: PORTABLE CHEST - 1 VIEW  COMPARISON:  07/09/2014.  FINDINGS: Right PICC line stable position. mediastinum and hilar structures normal. Stable cardiomegaly.  Diffuse right lung infiltrate. Mild infiltrate atelectasis left lower lobe. Small right pleural effusion. No pneumothorax.  IMPRESSION: 1. Right PICC line in stable position. 2. Diffuse right lung infiltrate. Mild left lower lobe infiltrate and subsegmental atelectasis . These findings are consistent with pneumonia.   Electronically Signed   By: [place]   On: 07/10/2014 15:36   Dg Chest Port 1 View  07/02/2014   CLINICAL DATA:  Atelectasis.  EXAM: PORTABLE CHEST - 1 VIEW  COMPARISON:  06/30/2014.  FINDINGS: Interim removal of endotracheal tube. Right PICC line in stable position. Mild fullness of the mediastinum noted, this is most likely related to tortuous thoracic aorta and prominent great vessels as well as AP portable technique . Cardiomegaly with mild pulmonary vascular prominence interstitial prominence suggesting mild congestive heart failure. Bibasilar subsegmental atelectasis. Tiny bilateral pleural effusions cannot be excluded. No pneumothorax.  IMPRESSION: 1. Right PICC line in stable position. 2. Cardiomegaly with mild pulmonary vascular prominence and mild interstitial prominence suggesting mild congestive heart failure. Tiny bilateral pleural effusions cannot be excluded. 3. New onset of subsegmental atelectasis both lung bases.   Electronically Signed   By: Marcello Moores  Register   On: 07/02/2014 07:39   Dg Chest Portable 1 View  06/30/2014   CLINICAL DATA:  Endotracheal tube placement. Difficulty breathing. Fever.  EXAM: PORTABLE CHEST - 1 VIEW  COMPARISON:  05/28/2014  FINDINGS: Endotracheal tube tip measures 2.6 cm above the carina. Right PICC line tip overlies the upper SVC region. No pneumothorax. Shallow inspiration. Heart size and pulmonary vascularity are normal. No focal airspace disease or consolidation in the lungs. No blunting of costophrenic angles. Tortuous aorta.  IMPRESSION: Appliances appear to be in satisfactory location. No evidence of active pulmonary disease.    Electronically Signed   By: Lucienne Capers M.D.   On: 06/30/2014 00:46   Dg Swallowing Func-speech Pathology  07/12/2014  Valere Dross, CCC-SLP     07/12/2014 10:32 AM  Objective Swallowing Evaluation:  (MBS)  Patient Details  Name: [name] MRN: [id number] Date of Birth: 30-Oct-1940  Today's Date: 07/12/2014 Time: SLP Start Time (ACUTE ONLY): 0908-SLP Stop Time (ACUTE  ONLY): 0926 SLP Time Calculation (min) (ACUTE ONLY): 18 min  Past Medical History:  Past Medical History  Diagnosis Date  . Anxiety   . Dyspnea   . Diabetes mellitus   . Osteoarthritis   . Hypercholesteremia   . Depression   . RLS (restless legs syndrome)   . Stress   . PONV (postoperative nausea and vomiting)   . Headache(784.0)   . HTN (hypertension)     dr Johnsie Cancel   Past Surgical History:  Past Surgical History  Procedure Laterality Date  . Colonoscopy    . Abdominal hysterectomy    . Lumbar laminectomy/decompression microdiscectomy N/A 04/18/2012    Procedure: LUMBAR LAMINECTOMY/DECOMPRESSION MICRODISCECTOMY 3  LEVELS;  Surgeon: Winfield Cunas, MD;  Location: [place] [facility];   Service: Neurosurgery;  Laterality: N/A;  Lumbar three-four,  lumbar four-five, lumbar five-sacral one decompression. Synovial  cyst resection lumbar four-five  . Joint replacement    . Left forearm fracture with orif Left   . Replacement total knee      right   HPI:  Other Pertinent Information: [name] is a 74 y.o.  female who had a recent hospitalization complicated by severe  sepsis due to UTI resulting in encephalopathy and acute renal  failure as well as acute respiratory failure requiring  intubation. she was discharged to nursing facility on May 13  Daughter states that while in at the nursing facility, patient   had a  swallow evaluation showed evidence of significant  aspiration. Family states that they were told that all the food  that is swallowed goes directly into the lungs. At nursing home  facility they made nothing by mouth. Per family she  was not  having history of coughing and shortness of breath with fever  since her discharge. Her GI doctor has been called and  recommended transfer to emergency department. ER physician spoke  to Dr. Benson Norway who is on-call who states that GI will consult and  patient may need endoscopy to clarify significant dysphagia. MBS  recommeded following BSE.  No Data Recorded  Assessment / Plan / Recommendation CHL IP CLINICAL IMPRESSIONS 07/12/2014  Therapy Diagnosis (None)  Clinical Impression Mild oral dysphagia with delayed transit and  moderate-severe sensorimotor pharyngeal dysphagia. Decreased  sensation resulted in valleculae initation site of swallow and  maximum vallecular and pyriform sinus residue. Pt unable to  produce volitional swallows provided max tactile and verbal  assist with demonstration and dry spoon trials. Silent aspiration  of honey thick present and risk of aspiration from residue is  high. Recommendation based on results are NPO however discussed  with MD and family desires to accept risk for comfort feeds. SLP  will follow up briefly for educatio re: safest swallow  precautions (upright position, no straws, 2 swallows using dry  spoon, sit up one hour after meals).       CHL IP TREATMENT RECOMMENDATION 07/12/2014  Treatment Recommendations Therapy as outlined in treatment plan  below     CHL IP DIET RECOMMENDATION 07/12/2014  SLP Diet Recommendations Dysphagia 1 (Puree)  Liquid Administration via (None)  Medication Administration Crushed with puree  Compensations Slow rate;Small sips/bites;Multiple dry swallows  after each bite/sip;Clear throat intermittently  Postural  Changes and/or Swallow Maneuvers (None)     CHL IP OTHER RECOMMENDATIONS 07/12/2014  Recommended Consults (None)  Oral Care Recommendations Oral care BID Other Recommendations  (None)     CHL IP FOLLOW UP RECOMMENDATIONS 07/11/2014  Follow up Recommendations Skilled Nursing facility     [facility] IP FREQUENCY AND DURATION 07/12/2014  Speech  Therapy Frequency (ACUTE ONLY) min 2x/week  Treatment Duration 2 weeks     Pertinent Vitals/Pain none    SLP Swallow Goals No flowsheet data found.  No flowsheet data found.    CHL IP REASON FOR REFERRAL 07/12/2014  Reason for Referral Objectively evaluate swallowing function     CHL IP ORAL PHASE 07/12/2014  Lips (None)  Tongue (None)  Mucous membranes (None)  Nutritional status (None)  Other (None)  Oxygen therapy (None)  Oral Phase Impaired  Oral - Pudding Teaspoon (None)  Oral - Pudding Cup (None)  Oral - Honey Teaspoon (None)  Oral - Honey Cup (None)  Oral - Honey Syringe (None)  Oral - Nectar Teaspoon (None)  Oral - Nectar Cup (None)  Oral - Nectar Straw (None)  Oral - Nectar Syringe (None)  Oral - Ice Chips (None)  Oral - Thin Teaspoon (None)  Oral - Thin Cup (None)  Oral - Thin Straw (None)  Oral - Thin Syringe (None)  Oral - Puree (None)  Oral - Mechanical Soft (None)  Oral - Regular (None)  Oral - Multi-consistency (None)  Oral - Pill (None)  Oral Phase - Comment (None)      CHL IP PHARYNGEAL PHASE 07/12/2014  Pharyngeal Phase Impaired  Pharyngeal - Pudding Teaspoon (None)  Penetration/Aspiration details (pudding teaspoon) (None)  Pharyngeal - Pudding Cup (None)  Penetration/Aspiration details (pudding cup) (None)  Pharyngeal - Honey Teaspoon (None)  Penetration/Aspiration details (honey teaspoon) (None)  Pharyngeal - Honey Cup (None)  Penetration/Aspiration details (honey cup) (None)  Pharyngeal - Honey Syringe (None)  Penetration/Aspiration details (honey syringe) (None)  Pharyngeal - Nectar Teaspoon (None)  Penetration/Aspiration details (nectar teaspoon) (None)  Pharyngeal - Nectar Cup (None)  Penetration/Aspiration details (nectar cup) (None)  Pharyngeal - Nectar Straw (None)  Penetration/Aspiration details (nectar straw) (None)  Pharyngeal - Nectar Syringe (None)  Penetration/Aspiration details (nectar syringe) (None)  Pharyngeal - Ice Chips (None)  Penetration/Aspiration details (ice chips) (None)   Pharyngeal - Thin Teaspoon (None)  Penetration/Aspiration details (thin teaspoon) (None)  Pharyngeal - Thin Cup (None)  Penetration/Aspiration details (thin cup) (None)  Pharyngeal - Thin Straw (None)  Penetration/Aspiration details (thin straw) (None)  Pharyngeal - Thin Syringe (None)  Penetration/Aspiration details (thin syringe') (None)  Pharyngeal - Puree (None)  Penetration/Aspiration details (puree) (None)  Pharyngeal - Mechanical Soft (None)  Penetration/Aspiration details (mechanical soft) (None)  Pharyngeal - Regular (None)  Penetration/Aspiration details (regular) (None)  Pharyngeal - Multi-consistency (None)  Penetration/Aspiration details (multi-consistency) (None)  Pharyngeal - Pill (None)  Penetration/Aspiration details (pill) (None) Pharyngeal Comment  (None)      CHL IP CERVICAL ESOPHAGEAL PHASE 07/12/2014  Cervical Esophageal Phase WFL  Pudding Teaspoon (None)  Pudding Cup (None)  Honey Teaspoon (None)  Honey Cup (None)  Honey Straw (None)  Nectar Teaspoon (None)  Nectar Cup (None)  Nectar Straw (None)  Nectar Sippy Cup (None)  Thin Teaspoon (None)  Thin Cup (None)  Thin Straw (None)  Thin Sippy Cup (None)  Cervical Esophageal Comment (None)    No flowsheet data found.         Houston Siren 07/12/2014, 10:31 AM  Orbie Pyo Colvin Caroli.Ed CCC-SLP  Pager [phone]      Oren Binet, MD  [facility] Pager:[phone]  If 7PM-7AM, please contact night-coverage [email or web address] Password [email or web address] 07/12/2014, 1:12 PM   LOS: 3 days

## 2014-07-13 LAB — GLUCOSE, CAPILLARY
GLUCOSE-CAPILLARY: 156 mg/dL — AB (ref 65–99)
Glucose-Capillary: 179 mg/dL — ABNORMAL HIGH (ref 65–99)
Glucose-Capillary: 155 mg/dL — ABNORMAL HIGH (ref 65–99)
Glucose-Capillary: 169 mg/dL — ABNORMAL HIGH (ref 65–99)
Glucose-Capillary: 204 mg/dL — ABNORMAL HIGH (ref 65–99)

## 2014-07-13 MED ORDER — AMLODIPINE BESYLATE 5 MG PO TABS
5.0000 mg | ORAL_TABLET | Freq: Every day | ORAL | Status: DC
  Administered 2014-07-13 – 2014-07-14 (×2): 5 mg via ORAL
  Filled 2014-07-13 (×2): qty 1

## 2014-07-13 NOTE — Progress Notes (Signed)
PATIENT DETAILS Name: [name] Age: 74 y.o. Sex: female Date of Birth: 10-14-1940 Admit Date: 07/09/2014 Admitting Physician Toy Baker, MD TSV:[id number] G, MD  Subjective: No major issues overnight. Awake/alert. Denies shortness of breath.  Assessment/Plan: Active Problems:   Acute metabolic encephalopathy: Suspect secondary to dehydration and aspiration pneumonia. CT head negative for acute abnormalities. MRI brain negative for CVA. Significantly improved. Continue IV antibiotics.   Suspected aspiration pneumonia: Initial chest x-ray negative for pneumonia, repeat chest x-ray post-hydration suggests right lower lobe infiltrate highly suspicious for aspiration pneumonia. Started on vancomycin and Zosyn on admission, since improved, cultures negative, Anka Meissen was discontinued, for now continue with Zosyn with plans to taper to Augmentin on discharge. Continues to improve, mentation back to baseline.   Sepsis: Secondary to above.  Sepsis physiology has resolved with IV antibiotics and IV fluids. Blood cultures negative so far.     Dysphagia: Suspect chronic dysphagia at baseline-apparently was on a dysphagia 3 diet prior to her last discharge. Suspect worsening dysphagia secondary to acute illness/progression of dementia. MRI brain negative for CVA. Seen by speech therapy, subsequently underwent a modified barium swallow which showed significant aspiration, have discussed with family on numerous occasions throughout this hospital stay-they do not desire any aggressive care-do not desire PEG tube-hence will cautiously continue with a dysphagia 1 diet. Daughter/family fully aware of risks of aspiration and accepting all risks. Will need palliative care follow-up once discharged to SNF.    Hypertension: BP uncontrolled-add amlodipine. Continue metoprolol, resume losartan. Follow BP and adjust accordingly.     Diabetes: Continue Levemir 5 units and SSI.  CBGs stable.Follow CBGs closely and adjust accordingly.    Stage II decubitus ulcer: Present prior to admission. Wound care following.    Dementia: Mental status at baseline.     Palliative care: 74 year old black female with a history of dementia-nonambulatory-bed to wheelchair bound-who recently had septic shock along with respiratory failure requiring mechanical intubation. She was discharged from this facility on 07/04/14. Per daughter she was in good state of health for 2 days, then started becoming very lethargic. Subsequently readmitted to this facility on 5/18 with worsening encephalopathy from sepsis/aspiration pneumonia. Multiple discussions held with family during this hospital stay, family aware that patient has poor overall prognoses. DO NOT RESUSCITATE in place. After much discussion with family, family at this time does not desire aggressive care, family also does not desire PEG tube. Plans are to discharge to SNF with palliative care follow-up.   Disposition: Remain inpatient-Suspect back to SNF n 5/23  Antimicrobial agents  See below  Anti-infectives    Start     Dose/Rate Route Frequency Ordered Stop   07/11/14 2300  vancomycin (VANCOCIN) IVPB 1000 mg/200 mL premix  Status:  Discontinued     1,000 mg 200 mL/hr over 60 Minutes Intravenous Every 12 hours 07/11/14 2254 07/12/14 1320   07/09/14 2230  vancomycin (VANCOCIN) IVPB 750 mg/150 ml premix  Status:  Discontinued     750 mg 150 mL/hr over 60 Minutes Intravenous Every 12 hours 07/09/14 2212 07/11/14 2254   07/09/14 2200  piperacillin-tazobactam (ZOSYN) IVPB 3.375 g     3.375 g 12.5 mL/hr over 240 Minutes Intravenous 3 times per day 07/09/14 2150        DVT Prophylaxis: Prophylactic Lovenox   Code Status: Full code  Family Communication None at bedside this morning  Procedures: None  CONSULTS:  None  Time spent 25 minutes-Greater than 50% of this time was spent in counseling, explanation of diagnosis,  planning of further management, and coordination of care.  MEDICATIONS: Scheduled Meds: . aspirin  81 mg Oral Daily  . collagenase   Topical Daily  . enoxaparin (LOVENOX) injection  40 mg Subcutaneous Q24H  . insulin aspart  0-9 Units Subcutaneous 6 times per day  . insulin detemir  5 Units Subcutaneous QHS  . losartan  100 mg Oral Daily  . metoprolol succinate  50 mg Oral Daily  . mirtazapine  7.5 mg Oral QHS  . pantoprazole  40 mg Oral Daily  . piperacillin-tazobactam (ZOSYN)  IV  3.375 g Intravenous 3 times per day  . pramipexole  0.375 mg Oral TID  . sodium chloride  3 mL Intravenous Q12H   Continuous Infusions:   PRN Meds:.acetaminophen, acetaminophen **OR** acetaminophen, albuterol, bisacodyl, labetalol, LORazepam, morphine injection, ondansetron **OR** ondansetron (ZOFRAN) IV, RESOURCE THICKENUP CLEAR, sodium chloride    PHYSICAL EXAM: Vital signs in last 24 hours: Filed Vitals:   07/12/14 1542 07/12/14 2100 07/13/14 0543 07/13/14 0657  BP: 140/76 157/80 189/92 168/85  Pulse: 75 75 70 72  Temp: 98.6 F (37 C) 98.7 F (37.1 C) 98.4 F (36.9 C)   TempSrc: Oral Oral Oral   Resp: 18 20 19    Height:      Weight:   77.5 kg (170 lb 13.7 oz)   SpO2: 97% 97% 98%     Weight change:  Filed Weights   07/09/14 2210 07/10/14 1352 07/13/14 0543  Weight: 76.8 kg (169 lb 5 oz) 76.8 kg (169 lb 5 oz) 77.5 kg (170 lb 13.7 oz)   Body mass index is 26.75 kg/(m^2).   Gen Exam: Awake and alert-speaking. Follows commands Neck: Supple,  Chest: B/L Clear-anteriorly  CVS: S1 S2 Regular, no murmurs.  Abdomen: soft, BS +, non tender, non distended.  Extremities: no edema, lower extremities warm to touch. Neurologic: Has chronic lower ext weakness-that is unchanged-but not focal Skin: No Rash.     Intake/Output from previous day:  Intake/Output Summary (Last 24 hours) at 07/13/14 1145 Last data filed at 07/13/14 1003  Gross per 24 hour  Intake    180 ml  Output      0 ml  Net     180 ml     LAB RESULTS: CBC  Recent Labs Lab 07/09/14 1734 07/09/14 2148 07/10/14 0605 07/11/14 0400  WBC 12.4* 10.5 7.3 7.1  HGB 10.6* 9.3* 8.5* 8.8*  HCT 32.7* 29.0* 25.9* 26.7*  PLT 267 214 215 215  MCV 82.6 82.4 82.7 83.2  MCH 26.8 26.4 27.2 27.4  MCHC 32.4 32.1 32.8 33.0  RDW 14.6 14.4 14.6 14.5  LYMPHSABS 0.9  --   --   --   MONOABS 0.9  --   --   --   EOSABS 0.0  --   --   --   BASOSABS 0.0  --   --   --     Chemistries   Recent Labs Lab 07/09/14 1734 07/09/14 2148 07/10/14 0605 07/11/14 0400  NA 138  --  139 144  K 3.8  --  3.4* 3.5  CL 100*  --  102 106  CO2 26  --  28 28  GLUCOSE 132*  --  140* 102*  BUN 8  --  8 7  CREATININE 0.60 0.66 0.69 0.65  CALCIUM 9.4  --  8.8* 8.8*  MG  --   --  1.4*  --     CBG:  Recent Labs Lab 07/12/14 1645 07/12/14 1932 07/12/14 2343 07/13/14 0408 07/13/14 0740  GLUCAP 194* 201* 180* 156* 179*    GFR Estimated Creatinine Clearance: 65.2 mL/min (by C-G formula based on Cr of 0.65).  Coagulation profile No results for input(s): INR, PROTIME in the last 168 hours.  Cardiac Enzymes No results for input(s): CKMB, TROPONINI, MYOGLOBIN in the last 168 hours.  Invalid input(s): CK  Invalid input(s): POCBNP No results for input(s): DDIMER in the last 72 hours. No results for input(s): HGBA1C in the last 72 hours. No results for input(s): CHOL, HDL, LDLCALC, TRIG, CHOLHDL, LDLDIRECT in the last 72 hours. No results for input(s): TSH, T4TOTAL, T3FREE, THYROIDAB in the last 72 hours.  Invalid input(s): FREET3 No results for input(s): VITAMINB12, FOLATE, FERRITIN, TIBC, IRON, RETICCTPCT in the last 72 hours. No results for input(s): LIPASE, AMYLASE in the last 72 hours.  Urine Studies No results for input(s): UHGB, CRYS in the last 72 hours.  Invalid input(s): UACOL, UAPR, USPG, UPH, UTP, UGL, UKET, UBIL, UNIT, UROB, ULEU, UEPI, UWBC, URBC, UBAC, CAST, UCOM, BILUA  MICROBIOLOGY: Recent Results (from the  past 240 hour(s))  Urine culture     Status: None   Collection Time: 07/09/14  6:50 PM  Result Value Ref Range Status   Specimen Description URINE, CATHETERIZED  Final   Special Requests NONE  Final   Colony Count NO GROWTH Performed at [facility]   Final   Culture NO GROWTH Performed at [facility]   Final   Report Status 07/10/2014 FINAL  Final  Culture, blood (routine x 2)     Status: None (Preliminary result)   Collection Time: 07/09/14  9:34 PM  Result Value Ref Range Status   Specimen Description BLOOD LEFT ARM  Final   Special Requests BOTTLES DRAWN AEROBIC AND ANAEROBIC 5ML  Final   Culture   Final           BLOOD CULTURE RECEIVED NO GROWTH TO DATE CULTURE WILL BE HELD FOR 5 DAYS BEFORE ISSUING A FINAL NEGATIVE REPORT Performed at [facility]    Report Status PENDING  Incomplete  Culture, blood (routine x 2)     Status: None (Preliminary result)   Collection Time: 07/09/14  9:48 PM  Result Value Ref Range Status   Specimen Description BLOOD LEFT HAND  Final   Special Requests BOTTLES DRAWN AEROBIC AND ANAEROBIC 5ML  Final   Culture   Final           BLOOD CULTURE RECEIVED NO GROWTH TO DATE CULTURE WILL BE HELD FOR 5 DAYS BEFORE ISSUING A FINAL NEGATIVE REPORT Performed at [facility]    Report Status PENDING  Incomplete    RADIOLOGY STUDIES/RESULTS: Dg Chest 2 View  07/09/2014   CLINICAL DATA:  Productive cough, possible aspiration 4 days ago, diabetes, hypertension  EXAM: CHEST  2 VIEW  COMPARISON:  07/02/2014  FINDINGS: RIGHT arm PICC line tip projects over SVC.  Normal heart size, mediastinal contours and pulmonary vascularity.  Atherosclerotic calcification aorta.  Minimal RIGHT basilar atelectasis.  Lungs otherwise clear.  Improved aeration versus previous study.  No pleural effusion or pneumothorax.  IMPRESSION: Improved aeration versus prior exam.   Electronically Signed   By: Lavonia Dana M.D.   On: 07/09/2014 18:04   Ct Head  Wo Contrast  07/09/2014   CLINICAL DATA:  Altered mental status  EXAM: CT HEAD WITHOUT CONTRAST  TECHNIQUE: Contiguous axial images were obtained from the base of the skull through the vertex without intravenous contrast.  COMPARISON:  05/20/2014  FINDINGS: Bony calvarium is intact. No gross soft tissue abnormality is seen. There is been interval improvement in the degree of opacification within the left maxillary antrum. Diffuse atrophic changes are noted. Mild chronic white matter ischemic change is seen. No findings to suggest acute hemorrhage, acute infarction or space-occupying mass lesion are noted.  IMPRESSION: Chronic atrophic in ischemic changes without acute abnormality.   Electronically Signed   By: Inez Catalina M.D.   On: 07/09/2014 17:57   Mr Jeri Cos IH Contrast  07/11/2014   CLINICAL DATA:  Acute encephalopathy.  EXAM: MRI HEAD WITHOUT AND WITH CONTRAST  TECHNIQUE: Multiplanar, multiecho pulse sequences of the brain and surrounding structures were obtained without and with intravenous contrast.  CONTRAST:  75mL MULTIHANCE GADOBENATE DIMEGLUMINE 529 MG/ML IV SOLN  COMPARISON:  Head CT 07/09/2014 and MRI 05/23/2011  FINDINGS: There is no evidence of acute infarct, mass, midline shift, or extra-axial fluid collection. Small foci of chronic hemorrhage are noted at the left temporoparietal junction and in the left occipital lobe. There is moderate cerebral and cerebellar atrophy. Periventricular white matter T2 hyperintensities are similar to the prior MRI and nonspecific but compatible with mild chronic small vessel ischemic disease. No abnormal enhancement is identified.  Orbits are unremarkable. Mild left maxillary sinus mucosal thickening and small left larger than right mastoid effusions are noted. Major intracranial vascular flow voids are preserved.  IMPRESSION: 1. No acute intracranial abnormality or mass. 2. Moderate cerebral atrophy and mild chronic small vessel ischemic disease.    Electronically Signed   By: Logan Bores   On: 07/11/2014 09:29   Dg Chest Port 1 View  07/10/2014   CLINICAL DATA:  Pneumonia.  EXAM: PORTABLE CHEST - 1 VIEW  COMPARISON:  07/09/2014.  FINDINGS: Right PICC line stable position. mediastinum and hilar structures normal. Stable cardiomegaly. Diffuse right lung infiltrate. Mild infiltrate atelectasis left lower lobe. Small right pleural effusion. No pneumothorax.  IMPRESSION: 1. Right PICC line in stable position. 2. Diffuse right lung infiltrate. Mild left lower lobe infiltrate and subsegmental atelectasis . These findings are consistent with pneumonia.   Electronically Signed   By: [place]   On: 07/10/2014 15:36   Dg Chest Port 1 View  07/02/2014   CLINICAL DATA:  Atelectasis.  EXAM: PORTABLE CHEST - 1 VIEW  COMPARISON:  06/30/2014.  FINDINGS: Interim removal of endotracheal tube. Right PICC line in stable position. Mild fullness of the mediastinum noted, this is most likely related to tortuous thoracic aorta and prominent great vessels as well as AP portable technique . Cardiomegaly with mild pulmonary vascular prominence interstitial prominence suggesting mild congestive heart failure. Bibasilar subsegmental atelectasis. Tiny bilateral pleural effusions cannot be excluded. No pneumothorax.  IMPRESSION: 1. Right PICC line in stable position. 2. Cardiomegaly with mild pulmonary vascular prominence and mild interstitial prominence suggesting mild congestive heart failure. Tiny bilateral pleural effusions cannot be excluded. 3. New onset of subsegmental atelectasis both lung bases.   Electronically Signed   By: Marcello Moores  Register   On: 07/02/2014 07:39   Dg Chest Portable 1 View  06/30/2014   CLINICAL DATA:  Endotracheal tube placement. Difficulty breathing. Fever.  EXAM: PORTABLE CHEST - 1 VIEW  COMPARISON:  05/28/2014  FINDINGS: Endotracheal tube tip measures 2.6 cm above the carina. Right PICC line tip overlies the upper SVC region. No pneumothorax.  Shallow inspiration. Heart  size and pulmonary vascularity are normal. No focal airspace disease or consolidation in the lungs. No blunting of costophrenic angles. Tortuous aorta.  IMPRESSION: Appliances appear to be in satisfactory location. No evidence of active pulmonary disease.   Electronically Signed   By: Lucienne Capers M.D.   On: 06/30/2014 00:46   Dg Swallowing Func-speech Pathology  07/12/2014   Valere Dross, CCC-SLP     07/12/2014 10:32 AM  Objective Swallowing Evaluation:  (MBS)  Patient Details  Name: ANN BOHNE MRN: [id number] Date of Birth: 1940/04/06  Today's Date: 07/12/2014 Time: SLP Start Time (ACUTE ONLY): 0908-SLP Stop Time (ACUTE  ONLY): 0926 SLP Time Calculation (min) (ACUTE ONLY): 18 min  Past Medical History:  Past Medical History  Diagnosis Date  . Anxiety   . Dyspnea   . Diabetes mellitus   . Osteoarthritis   . Hypercholesteremia   . Depression   . RLS (restless legs syndrome)   . Stress   . PONV (postoperative nausea and vomiting)   . Headache(784.0)   . HTN (hypertension)     dr Johnsie Cancel   Past Surgical History:  Past Surgical History  Procedure Laterality Date  . Colonoscopy    . Abdominal hysterectomy    . Lumbar laminectomy/decompression microdiscectomy N/A 04/18/2012    Procedure: LUMBAR LAMINECTOMY/DECOMPRESSION MICRODISCECTOMY 3  LEVELS;  Surgeon: Winfield Cunas, MD;  Location: [facility];   Service: Neurosurgery;  Laterality: N/A;  Lumbar three-four,  lumbar four-five, lumbar five-sacral one decompression. Synovial  cyst resection lumbar four-five  . Joint replacement    . Left forearm fracture with orif Left   . Replacement total knee      right   HPI:  Other Pertinent Information: YISELLE BABICH is a 74 y.o.  female who had a recent hospitalization complicated by severe  sepsis due to UTI resulting in encephalopathy and acute renal  failure as well as acute respiratory failure requiring  intubation. she was discharged to nursing facility on May 13  Daughter states  that while in at the nursing facility, patient   had a  swallow evaluation showed evidence of significant  aspiration. Family states that they were told that all the food  that is swallowed goes directly into the lungs. At nursing home  facility they made nothing by mouth. Per family she was not  having history of coughing and shortness of breath with fever  since her discharge. Her GI doctor has been called and  recommended transfer to emergency department. ER physician spoke  to Dr. Benson Norway who is on-call who states that GI will consult and  patient may need endoscopy to clarify significant dysphagia. MBS  recommeded following BSE.  No Data Recorded  Assessment / Plan / Recommendation CHL IP CLINICAL IMPRESSIONS 07/12/2014  Therapy Diagnosis (None)  Clinical Impression Mild oral dysphagia with delayed transit and  moderate-severe sensorimotor pharyngeal dysphagia. Decreased  sensation resulted in valleculae initation site of swallow and  maximum vallecular and pyriform sinus residue. Pt unable to  produce volitional swallows provided max tactile and verbal  assist with demonstration and dry spoon trials. Silent aspiration  of honey thick present and risk of aspiration from residue is  high. Recommendation based on results are NPO however discussed  with MD and family desires to accept risk for comfort feeds. SLP  will follow up briefly for educatio re: safest swallow  precautions (upright position, no straws, 2 swallows using dry  spoon, sit up one hour after meals).  CHL IP TREATMENT RECOMMENDATION 07/12/2014  Treatment Recommendations Therapy as outlined in treatment plan  below     CHL IP DIET RECOMMENDATION 07/12/2014  SLP Diet Recommendations Dysphagia 1 (Puree)  Liquid Administration via (None)  Medication Administration Crushed with puree  Compensations Slow rate;Small sips/bites;Multiple dry swallows  after each bite/sip;Clear throat intermittently  Postural Changes and/or Swallow Maneuvers (None)     CHL  IP OTHER RECOMMENDATIONS 07/12/2014  Recommended Consults (None)  Oral Care Recommendations Oral care BID Other Recommendations  (None)     CHL IP FOLLOW UP RECOMMENDATIONS 07/11/2014  Follow up Recommendations Skilled Nursing facility     [facility] IP FREQUENCY AND DURATION 07/12/2014  Speech Therapy Frequency (ACUTE ONLY) min 2x/week  Treatment Duration 2 weeks     Pertinent Vitals/Pain none    SLP Swallow Goals No flowsheet data found.  No flowsheet data found.    CHL IP REASON FOR REFERRAL 07/12/2014  Reason for Referral Objectively evaluate swallowing function     CHL IP ORAL PHASE 07/12/2014  Lips (None)  Tongue (None)  Mucous membranes (None)  Nutritional status (None)  Other (None)  Oxygen therapy (None)  Oral Phase Impaired  Oral - Pudding Teaspoon (None)  Oral - Pudding Cup (None)  Oral - Honey Teaspoon (None)  Oral - Honey Cup (None)  Oral - Honey Syringe (None)  Oral - Nectar Teaspoon (None)  Oral - Nectar Cup (None)  Oral - Nectar Straw (None)  Oral - Nectar Syringe (None)  Oral - Ice Chips (None)  Oral - Thin Teaspoon (None)  Oral - Thin Cup (None)  Oral - Thin Straw (None)  Oral - Thin Syringe (None)  Oral - Puree (None)  Oral - Mechanical Soft (None)  Oral - Regular (None)  Oral - Multi-consistency (None)  Oral - Pill (None)  Oral Phase - Comment (None)      CHL IP PHARYNGEAL PHASE 07/12/2014  Pharyngeal Phase Impaired  Pharyngeal - Pudding Teaspoon (None)  Penetration/Aspiration details (pudding teaspoon) (None)  Pharyngeal - Pudding Cup (None)  Penetration/Aspiration details (pudding cup) (None)  Pharyngeal - Honey Teaspoon (None)  Penetration/Aspiration details (honey teaspoon) (None)  Pharyngeal - Honey Cup (None)  Penetration/Aspiration details (honey cup) (None)  Pharyngeal - Honey Syringe (None)  Penetration/Aspiration details (honey syringe) (None)  Pharyngeal - Nectar Teaspoon (None)  Penetration/Aspiration details (nectar teaspoon) (None)  Pharyngeal - Nectar Cup (None)  Penetration/Aspiration details  (nectar cup) (None)  Pharyngeal - Nectar Straw (None)  Penetration/Aspiration details (nectar straw) (None)  Pharyngeal - Nectar Syringe (None)  Penetration/Aspiration details (nectar syringe) (None)  Pharyngeal - Ice Chips (None)  Penetration/Aspiration details (ice chips) (None)  Pharyngeal - Thin Teaspoon (None)  Penetration/Aspiration details (thin teaspoon) (None)  Pharyngeal - Thin Cup (None)  Penetration/Aspiration details (thin cup) (None)  Pharyngeal - Thin Straw (None)  Penetration/Aspiration details (thin straw) (None)  Pharyngeal - Thin Syringe (None)  Penetration/Aspiration details (thin syringe') (None)  Pharyngeal - Puree (None)  Penetration/Aspiration details (puree) (None)  Pharyngeal - Mechanical Soft (None)  Penetration/Aspiration details (mechanical soft) (None)  Pharyngeal - Regular (None)  Penetration/Aspiration details (regular) (None)  Pharyngeal - Multi-consistency (None)  Penetration/Aspiration details (multi-consistency) (None)  Pharyngeal - Pill (None)  Penetration/Aspiration details (pill) (None) Pharyngeal Comment  (None)      CHL IP CERVICAL ESOPHAGEAL PHASE 07/12/2014  Cervical Esophageal Phase WFL  Pudding Teaspoon (None)  Pudding Cup (None)  Honey Teaspoon (None)  Honey Cup (None)  Honey Straw (None)  Nectar Teaspoon (None)  Nectar Cup (None)  Nectar Straw (None)  Nectar Sippy Cup (None)  Thin Teaspoon (None)  Thin Cup (None)  Thin Straw (None)  Thin Sippy Cup (None)  Cervical Esophageal Comment (None)    No flowsheet data found.         Houston Siren 07/12/2014, 10:31 AM  Orbie Pyo Colvin Caroli.Ed CCC-SLP Pager [phone]      Oren Binet, MD  [facility] Pager:[phone]  If 7PM-7AM, please contact night-coverage [email or web address] Password [email or web address] 07/13/2014, 11:45 AM   LOS: 4 days

## 2014-07-14 DIAGNOSIS — Z885 Allergy status to narcotic agent status: Secondary | ICD-10-CM | POA: Diagnosis not present

## 2014-07-14 DIAGNOSIS — J302 Other seasonal allergic rhinitis: Secondary | ICD-10-CM | POA: Diagnosis not present

## 2014-07-14 DIAGNOSIS — N390 Urinary tract infection, site not specified: Secondary | ICD-10-CM | POA: Diagnosis not present

## 2014-07-14 DIAGNOSIS — Z882 Allergy status to sulfonamides status: Secondary | ICD-10-CM | POA: Diagnosis not present

## 2014-07-14 DIAGNOSIS — N184 Chronic kidney disease, stage 4 (severe): Secondary | ICD-10-CM | POA: Diagnosis present

## 2014-07-14 DIAGNOSIS — E1122 Type 2 diabetes mellitus with diabetic chronic kidney disease: Secondary | ICD-10-CM | POA: Diagnosis present

## 2014-07-14 DIAGNOSIS — M6281 Muscle weakness (generalized): Secondary | ICD-10-CM | POA: Diagnosis not present

## 2014-07-14 DIAGNOSIS — F418 Other specified anxiety disorders: Secondary | ICD-10-CM | POA: Diagnosis not present

## 2014-07-14 DIAGNOSIS — L89152 Pressure ulcer of sacral region, stage 2: Secondary | ICD-10-CM | POA: Diagnosis present

## 2014-07-14 DIAGNOSIS — Z7982 Long term (current) use of aspirin: Secondary | ICD-10-CM | POA: Diagnosis not present

## 2014-07-14 DIAGNOSIS — R339 Retention of urine, unspecified: Secondary | ICD-10-CM | POA: Diagnosis not present

## 2014-07-14 DIAGNOSIS — E876 Hypokalemia: Secondary | ICD-10-CM | POA: Diagnosis present

## 2014-07-14 DIAGNOSIS — Z794 Long term (current) use of insulin: Secondary | ICD-10-CM | POA: Diagnosis not present

## 2014-07-14 DIAGNOSIS — Z66 Do not resuscitate: Secondary | ICD-10-CM | POA: Diagnosis present

## 2014-07-14 DIAGNOSIS — K3184 Gastroparesis: Secondary | ICD-10-CM | POA: Diagnosis not present

## 2014-07-14 DIAGNOSIS — R4182 Altered mental status, unspecified: Secondary | ICD-10-CM | POA: Diagnosis not present

## 2014-07-14 DIAGNOSIS — R509 Fever, unspecified: Secondary | ICD-10-CM | POA: Diagnosis present

## 2014-07-14 DIAGNOSIS — L8990 Pressure ulcer of unspecified site, unspecified stage: Secondary | ICD-10-CM | POA: Diagnosis not present

## 2014-07-14 DIAGNOSIS — N179 Acute kidney failure, unspecified: Secondary | ICD-10-CM | POA: Diagnosis present

## 2014-07-14 DIAGNOSIS — Z79899 Other long term (current) drug therapy: Secondary | ICD-10-CM | POA: Diagnosis not present

## 2014-07-14 DIAGNOSIS — Y95 Nosocomial condition: Secondary | ICD-10-CM | POA: Diagnosis present

## 2014-07-14 DIAGNOSIS — J9601 Acute respiratory failure with hypoxia: Secondary | ICD-10-CM | POA: Diagnosis not present

## 2014-07-14 DIAGNOSIS — E860 Dehydration: Secondary | ICD-10-CM | POA: Diagnosis present

## 2014-07-14 DIAGNOSIS — E119 Type 2 diabetes mellitus without complications: Secondary | ICD-10-CM | POA: Diagnosis not present

## 2014-07-14 DIAGNOSIS — M1990 Unspecified osteoarthritis, unspecified site: Secondary | ICD-10-CM | POA: Diagnosis present

## 2014-07-14 DIAGNOSIS — F0390 Unspecified dementia without behavioral disturbance: Secondary | ICD-10-CM | POA: Diagnosis present

## 2014-07-14 DIAGNOSIS — A419 Sepsis, unspecified organism: Secondary | ICD-10-CM | POA: Diagnosis not present

## 2014-07-14 DIAGNOSIS — R278 Other lack of coordination: Secondary | ICD-10-CM | POA: Diagnosis not present

## 2014-07-14 DIAGNOSIS — J690 Pneumonitis due to inhalation of food and vomit: Secondary | ICD-10-CM | POA: Diagnosis not present

## 2014-07-14 DIAGNOSIS — Z515 Encounter for palliative care: Secondary | ICD-10-CM | POA: Diagnosis not present

## 2014-07-14 DIAGNOSIS — E559 Vitamin D deficiency, unspecified: Secondary | ICD-10-CM | POA: Diagnosis not present

## 2014-07-14 DIAGNOSIS — I10 Essential (primary) hypertension: Secondary | ICD-10-CM | POA: Diagnosis not present

## 2014-07-14 DIAGNOSIS — J189 Pneumonia, unspecified organism: Secondary | ICD-10-CM | POA: Diagnosis not present

## 2014-07-14 DIAGNOSIS — F419 Anxiety disorder, unspecified: Secondary | ICD-10-CM | POA: Diagnosis present

## 2014-07-14 DIAGNOSIS — G2581 Restless legs syndrome: Secondary | ICD-10-CM | POA: Diagnosis present

## 2014-07-14 DIAGNOSIS — Z8701 Personal history of pneumonia (recurrent): Secondary | ICD-10-CM | POA: Diagnosis not present

## 2014-07-14 DIAGNOSIS — I129 Hypertensive chronic kidney disease with stage 1 through stage 4 chronic kidney disease, or unspecified chronic kidney disease: Secondary | ICD-10-CM | POA: Diagnosis present

## 2014-07-14 DIAGNOSIS — E1165 Type 2 diabetes mellitus with hyperglycemia: Secondary | ICD-10-CM | POA: Diagnosis present

## 2014-07-14 DIAGNOSIS — G9340 Encephalopathy, unspecified: Secondary | ICD-10-CM | POA: Diagnosis not present

## 2014-07-14 DIAGNOSIS — F329 Major depressive disorder, single episode, unspecified: Secondary | ICD-10-CM | POA: Diagnosis present

## 2014-07-14 DIAGNOSIS — R1312 Dysphagia, oropharyngeal phase: Secondary | ICD-10-CM | POA: Diagnosis present

## 2014-07-14 DIAGNOSIS — E870 Hyperosmolality and hypernatremia: Secondary | ICD-10-CM | POA: Diagnosis present

## 2014-07-14 DIAGNOSIS — E780 Pure hypercholesterolemia: Secondary | ICD-10-CM | POA: Diagnosis present

## 2014-07-14 DIAGNOSIS — Z833 Family history of diabetes mellitus: Secondary | ICD-10-CM | POA: Diagnosis not present

## 2014-07-14 DIAGNOSIS — D638 Anemia in other chronic diseases classified elsewhere: Secondary | ICD-10-CM | POA: Diagnosis present

## 2014-07-14 DIAGNOSIS — R1310 Dysphagia, unspecified: Secondary | ICD-10-CM | POA: Diagnosis not present

## 2014-07-14 DIAGNOSIS — R4702 Dysphasia: Secondary | ICD-10-CM | POA: Diagnosis present

## 2014-07-14 LAB — GLUCOSE, CAPILLARY
GLUCOSE-CAPILLARY: 138 mg/dL — AB (ref 65–99)
GLUCOSE-CAPILLARY: 147 mg/dL — AB (ref 65–99)
GLUCOSE-CAPILLARY: 154 mg/dL — AB (ref 65–99)
Glucose-Capillary: 121 mg/dL — ABNORMAL HIGH (ref 65–99)
Glucose-Capillary: 150 mg/dL — ABNORMAL HIGH (ref 65–99)

## 2014-07-14 MED ORDER — AMLODIPINE BESYLATE 5 MG PO TABS: 5.0000 mg | ORAL_TABLET | Freq: Every day | ORAL | Status: DC

## 2014-07-14 MED ORDER — INSULIN DETEMIR 100 UNIT/ML ~~LOC~~ SOLN: 5.0000 [IU] | Freq: Every day | SUBCUTANEOUS | Status: DC

## 2014-07-14 MED ORDER — AMOXICILLIN-POT CLAVULANATE 875-125 MG PO TABS: 1.0000 | ORAL_TABLET | Freq: Two times a day (BID) | ORAL | Status: DC

## 2014-07-14 MED ORDER — ALPRAZOLAM 0.5 MG PO TABS: 0.5000 mg | ORAL_TABLET | Freq: Two times a day (BID) | ORAL | Status: DC | PRN

## 2014-07-14 MED ORDER — COLLAGENASE 250 UNIT/GM EX OINT: TOPICAL_OINTMENT | Freq: Every day | CUTANEOUS | Status: DC

## 2014-07-14 MED ORDER — INSULIN ASPART 100 UNIT/ML ~~LOC~~ SOLN: 0.0000 [IU] | Freq: Three times a day (TID) | SUBCUTANEOUS | Status: DC

## 2014-07-14 NOTE — Progress Notes (Signed)
Speech Language Pathology Treatment: Dysphagia  Patient Details Name: [name] MRN: [id number] DOB: August 23, 1940 Today's Date: 07/14/2014 Time: 1145-1200 SLP Time Calculation (min) (ACUTE ONLY): 15 min  Assessment / Plan / Recommendation Clinical Impression  SLP arrived at pts daughter was feeding her am meal. Pt tolerating honey thickened liquids relatively well, one delayed cough observed after meal. Reinforced findings of MBS, important strategies and diet. Daughter retrun demonatrated and verbalized her understanding. Pt plans to d/c to [facility] today; can f/u with SLP there    HPI Other Pertinent Information: [name] is a 74 y.o. female who had a recent hospitalization complicated by severe sepsis due to UTI resulting in encephalopathy and acute renal failure as well as acute respiratory failure requiring intubation. she was discharged to nursing facility on May 13 Daughter states that while in at the nursing facility, patient  had a  swallow evaluation showed evidence of significant aspiration. Family states that they were told that all the food that is swallowed goes directly into the lungs. At nursing home facility they made nothing by mouth. Per family she was not having history of coughing and shortness of breath with fever since her discharge. Her GI doctor has been called and recommended transfer to emergency department. ER physician spoke to Dr. Benson Norway who is on-call who states that GI will consult and patient may need endoscopy to clarify significant dysphagia. MBS recommeded following BSE.   Pertinent Vitals    SLP Plan  Continue with current plan of care;MBS    Recommendations Liquids provided via: Cup;Teaspoon Medication Administration: Crushed with puree Supervision: Staff to assist with self feeding Compensations: Slow rate;Small sips/bites;Multiple dry swallows after each bite/sip;Clear throat intermittently Postural Changes and/or Swallow Maneuvers: Seated  upright 90 degrees;Upright 30-60 min after meal              Oral Care Recommendations: Oral care BID Plan: Continue with current plan of care;MBS    GO    Herbie Baltimore, MA CCC-SLP [phone]  Lynann Beaver 07/14/2014, 12:24 PM

## 2014-07-14 NOTE — Progress Notes (Signed)
Patient being discharged to SNF per MD. PICC line flushed and saline locked. No c/o pain at this time. Family at bedside. Report called and awaiting on transport via [place].

## 2014-07-14 NOTE — Discharge Summary (Signed)
PATIENT DETAILS Name: [name] Age: 74 y.o. Sex: female Date of Birth: [birth year] MRN: [id number]. Admitting Physician: Toy Baker, MD BJY:NWGNFAOZ,HYQMVH G, MD  Admit Date: 07/09/2014 Discharge date: 07/14/2014  Recommendations for Outpatient Follow-up:  1. Please ensure palliative care or hospice follow-up while at facility. 2. Patient is a DO NOT RESUSCITATE. 3. Family/ patient does not desire feeding tube/PEG tube now or in the future. 4. Please ensure patient has speech therapy follow-up-currently on dysphagia 1 diet.   PRIMARY DISCHARGE DIAGNOSIS:  Active Problems:   Essential hypertension   Acute encephalopathy   Diabetes   Dementia   Dysphagia   Decubitus ulcer   Blood poisoning      PAST MEDICAL HISTORY: Past Medical History  Diagnosis Date  . Anxiety   . Dyspnea   . Diabetes mellitus   . Osteoarthritis   . Hypercholesteremia   . Depression   . RLS (restless legs syndrome)   . Stress   . PONV (postoperative nausea and vomiting)   . Headache(784.0)   . HTN (hypertension)     dr Johnsie Cancel    DISCHARGE MEDICATIONS: Current Discharge Medication List    START taking these medications   Details  amLODipine (NORVASC) 5 MG tablet Take 1 tablet (5 mg total) by mouth daily.    amoxicillin-clavulanate (AUGMENTIN) 875-125 MG per tablet Take 1 tablet by mouth 2 (two) times daily. 2 more days from 07/14/14.    collagenase (SANTYL) ointment Apply topically daily. Cleanse sacrum and coccyx with NS and pat gently dry.  APply Santyl ointment (1/8 inch thickness) (opaque) to wound beds.  Cover with NS moist gauze.  Secure with ABD pad and tape.  Change daily.  Offload heel pressure with pillow under calves.  Turn and reposition every 2 hours. Qty: 15 g, Refills: 0      CONTINUE these medications which have CHANGED   Details  ALPRAZolam (XANAX) 0.5 MG tablet Take 1 tablet (0.5 mg total) by mouth 2 (two) times daily as needed for anxiety. Qty: 10 tablet,  Refills: 0    insulin aspart (NOVOLOG) 100 UNIT/ML injection Inject 0-9 Units into the skin 3 (three) times daily with meals. CBG < 70: implement hypoglycemia protocol CBG 70 - 120: 0 units CBG 121 - 150: 1 unit CBG 151 - 200: 2 units CBG 201 - 250: 3 units CBG 251 - 300: 5 units CBG 301 - 350: 7 units CBG 351 - 400: 9 units CBG > 400: call MD Qty: 10 mL, Refills: 11    insulin detemir (LEVEMIR) 100 UNIT/ML injection Inject 0.05 mLs (5 Units total) into the skin at bedtime. Qty: 10 mL, Refills: 11      CONTINUE these medications which have NOT CHANGED   Details  acetaminophen (TYLENOL) 500 MG tablet Take 500 mg by mouth every 4 (four) hours as needed for mild pain, fever or headache (99.5 to 101).     aspirin 81 MG chewable tablet Chew 81 mg by mouth daily.    B Complex Vitamins (B-COMPLEX/B-12 PO) Take 1 tablet by mouth daily.     bisacodyl (DULCOLAX) 10 MG suppository Place 10 mg rectally as needed for moderate constipation.    cholecalciferol (VITAMIN D) 1000 UNITS tablet Take 1,000 Units by mouth daily.    Cranberry-Vitamin C-Probiotic (AZO CRANBERRY) 250-30 MG TABS Take 1 tablet by mouth daily.    loperamide (IMODIUM) 2 MG capsule Take 2 mg by mouth as needed for diarrhea or loose stools. Do not exceed 8  doses in 24 hours    loratadine (CLARITIN) 10 MG tablet Take 10 mg by mouth daily as needed for allergies.     losartan (COZAAR) 100 MG tablet Take 100 mg by mouth daily.    Magnesium Oxide 500 MG TABS Take 500 mg by mouth daily.     metoprolol succinate (TOPROL-XL) 50 MG 24 hr tablet Take 1 tablet (50 mg total) by mouth daily. Qty: 30 tablet, Refills: 0    mirtazapine (REMERON) 7.5 MG tablet Take 1 tablet (7.5 mg total) by mouth at bedtime. Qty: 30 tablet, Refills: 0    omeprazole (PRILOSEC) 40 MG capsule Take 40 mg by mouth daily.    ondansetron (ZOFRAN) 4 MG tablet Take 4 mg by mouth every 8 (eight) hours as needed for nausea or vomiting.     polyethylene  glycol (MIRALAX / GLYCOLAX) packet Take 17 g by mouth daily. Qty: 14 each, Refills: 0    potassium chloride SA (K-DUR,KLOR-CON) 20 MEQ tablet Take 40 mEq by mouth daily.     pramipexole (MIRAPEX) 0.125 MG tablet Take 3 tablets (0.375 mg total) by mouth 3 (three) times daily. Qty: 90 tablet, Refills: 0    senna-docusate (SENOKOT-S) 8.6-50 MG per tablet Take 1 tablet by mouth at bedtime as needed for mild constipation. Qty: 30 tablet, Refills: 12    feeding supplement, ENSURE ENLIVE, (ENSURE ENLIVE) LIQD Take 237 mLs by mouth 2 (two) times daily between meals. Qty: 237 mL, Refills: 12      STOP taking these medications     linezolid (ZYVOX) 600 MG tablet         ALLERGIES:   Allergies  Allergen Reactions  . Morphine And Related Other (See Comments)    hallucination   . Oxycodone Other (See Comments)    hallucination   . Sulfonamide Derivatives Swelling    BRIEF HPI:  See H&P, Labs, Consult and Test reports for all details in brief, patient is a 74 year old female with multiple medical problems admitted to the hospital for worsening shortness of breath, cough and concern for aspiration.  CONSULTATIONS:   GI  PERTINENT RADIOLOGIC STUDIES: Dg Chest 2 View  07/09/2014   CLINICAL DATA:  Productive cough, possible aspiration 4 days ago, diabetes, hypertension  EXAM: CHEST  2 VIEW  COMPARISON:  07/02/2014  FINDINGS: RIGHT arm PICC line tip projects over SVC.  Normal heart size, mediastinal contours and pulmonary vascularity.  Atherosclerotic calcification aorta.  Minimal RIGHT basilar atelectasis.  Lungs otherwise clear.  Improved aeration versus previous study.  No pleural effusion or pneumothorax.  IMPRESSION: Improved aeration versus prior exam.   Electronically Signed   By: Lavonia Dana M.D.   On: 07/09/2014 18:04   Ct Head Wo Contrast  07/09/2014   CLINICAL DATA:  Altered mental status  EXAM: CT HEAD WITHOUT CONTRAST  TECHNIQUE: Contiguous axial images were obtained from the  base of the skull through the vertex without intravenous contrast.  COMPARISON:  05/20/2014  FINDINGS: Bony calvarium is intact. No gross soft tissue abnormality is seen. There is been interval improvement in the degree of opacification within the left maxillary antrum. Diffuse atrophic changes are noted. Mild chronic white matter ischemic change is seen. No findings to suggest acute hemorrhage, acute infarction or space-occupying mass lesion are noted.  IMPRESSION: Chronic atrophic in ischemic changes without acute abnormality.   Electronically Signed   By: Inez Catalina M.D.   On: 07/09/2014 17:57   Mr Jeri Cos PN Contrast  07/11/2014  CLINICAL DATA:  Acute encephalopathy.  EXAM: MRI HEAD WITHOUT AND WITH CONTRAST  TECHNIQUE: Multiplanar, multiecho pulse sequences of the brain and surrounding structures were obtained without and with intravenous contrast.  CONTRAST:  37mL MULTIHANCE GADOBENATE DIMEGLUMINE 529 MG/ML IV SOLN  COMPARISON:  Head CT 07/09/2014 and MRI 05/23/2011  FINDINGS: There is no evidence of acute infarct, mass, midline shift, or extra-axial fluid collection. Small foci of chronic hemorrhage are noted at the left temporoparietal junction and in the left occipital lobe. There is moderate cerebral and cerebellar atrophy. Periventricular white matter T2 hyperintensities are similar to the prior MRI and nonspecific but compatible with mild chronic small vessel ischemic disease. No abnormal enhancement is identified.  Orbits are unremarkable. Mild left maxillary sinus mucosal thickening and small left larger than right mastoid effusions are noted. Major intracranial vascular flow voids are preserved.  IMPRESSION: 1. No acute intracranial abnormality or mass. 2. Moderate cerebral atrophy and mild chronic small vessel ischemic disease.   Electronically Signed   By: Logan Bores   On: 07/11/2014 09:29   Dg Chest Port 1 View  07/10/2014   CLINICAL DATA:  Pneumonia.  EXAM: PORTABLE CHEST - 1 VIEW   COMPARISON:  07/09/2014.  FINDINGS: Right PICC line stable position. mediastinum and hilar structures normal. Stable cardiomegaly. Diffuse right lung infiltrate. Mild infiltrate atelectasis left lower lobe. Small right pleural effusion. No pneumothorax.  IMPRESSION: 1. Right PICC line in stable position. 2. Diffuse right lung infiltrate. Mild left lower lobe infiltrate and subsegmental atelectasis . These findings are consistent with pneumonia.   Electronically Signed   By: [place]   On: 07/10/2014 15:36   Dg Chest Port 1 View  07/02/2014   CLINICAL DATA:  Atelectasis.  EXAM: PORTABLE CHEST - 1 VIEW  COMPARISON:  06/30/2014.  FINDINGS: Interim removal of endotracheal tube. Right PICC line in stable position. Mild fullness of the mediastinum noted, this is most likely related to tortuous thoracic aorta and prominent great vessels as well as AP portable technique . Cardiomegaly with mild pulmonary vascular prominence interstitial prominence suggesting mild congestive heart failure. Bibasilar subsegmental atelectasis. Tiny bilateral pleural effusions cannot be excluded. No pneumothorax.  IMPRESSION: 1. Right PICC line in stable position. 2. Cardiomegaly with mild pulmonary vascular prominence and mild interstitial prominence suggesting mild congestive heart failure. Tiny bilateral pleural effusions cannot be excluded. 3. New onset of subsegmental atelectasis both lung bases.   Electronically Signed   By: Marcello Moores  Register   On: 07/02/2014 07:39   Dg Chest Portable 1 View  06/30/2014   CLINICAL DATA:  Endotracheal tube placement. Difficulty breathing. Fever.  EXAM: PORTABLE CHEST - 1 VIEW  COMPARISON:  05/28/2014  FINDINGS: Endotracheal tube tip measures 2.6 cm above the carina. Right PICC line tip overlies the upper SVC region. No pneumothorax. Shallow inspiration. Heart size and pulmonary vascularity are normal. No focal airspace disease or consolidation in the lungs. No blunting of costophrenic angles.  Tortuous aorta.  IMPRESSION: Appliances appear to be in satisfactory location. No evidence of active pulmonary disease.   Electronically Signed   By: Lucienne Capers M.D.   On: 06/30/2014 00:46   Dg Swallowing Func-speech Pathology  07/12/2014   Valere Dross, CCC-SLP     07/12/2014 10:32 AM  Objective Swallowing Evaluation:  (MBS)  Patient Details  Name: GESENIA BANTZ MRN: [id number] Date of Birth: 16-Oct-1940  Today's Date: 07/12/2014 Time: SLP Start Time (ACUTE ONLY): 0908-SLP Stop Time (ACUTE  ONLY): 0962 SLP Time Calculation (  min) (ACUTE ONLY): 18 min  Past Medical History:  Past Medical History  Diagnosis Date  . Anxiety   . Dyspnea   . Diabetes mellitus   . Osteoarthritis   . Hypercholesteremia   . Depression   . RLS (restless legs syndrome)   . Stress   . PONV (postoperative nausea and vomiting)   . Headache(784.0)   . HTN (hypertension)     dr Johnsie Cancel   Past Surgical History:  Past Surgical History  Procedure Laterality Date  . Colonoscopy    . Abdominal hysterectomy    . Lumbar laminectomy/decompression microdiscectomy N/A 04/18/2012    Procedure: LUMBAR LAMINECTOMY/DECOMPRESSION MICRODISCECTOMY 3  LEVELS;  Surgeon: Winfield Cunas, MD;  Location: [facility];   Service: Neurosurgery;  Laterality: N/A;  Lumbar three-four,  lumbar four-five, lumbar five-sacral one decompression. Synovial  cyst resection lumbar four-five  . Joint replacement    . Left forearm fracture with orif Left   . Replacement total knee      right   HPI:  Other Pertinent Information: MICHOLE LECUYER is a 74 y.o.  female who had a recent hospitalization complicated by severe  sepsis due to UTI resulting in encephalopathy and acute renal  failure as well as acute respiratory failure requiring  intubation. she was discharged to nursing facility on May 13  Daughter states that while in at the nursing facility, patient   had a  swallow evaluation showed evidence of significant  aspiration. Family states that they were told that all the  food  that is swallowed goes directly into the lungs. At nursing home  facility they made nothing by mouth. Per family she was not  having history of coughing and shortness of breath with fever  since her discharge. Her GI doctor has been called and  recommended transfer to emergency department. ER physician spoke  to Dr. Benson Norway who is on-call who states that GI will consult and  patient may need endoscopy to clarify significant dysphagia. MBS  recommeded following BSE.  No Data Recorded  Assessment / Plan / Recommendation CHL IP CLINICAL IMPRESSIONS 07/12/2014  Therapy Diagnosis (None)  Clinical Impression Mild oral dysphagia with delayed transit and  moderate-severe sensorimotor pharyngeal dysphagia. Decreased  sensation resulted in valleculae initation site of swallow and  maximum vallecular and pyriform sinus residue. Pt unable to  produce volitional swallows provided max tactile and verbal  assist with demonstration and dry spoon trials. Silent aspiration  of honey thick present and risk of aspiration from residue is  high. Recommendation based on results are NPO however discussed  with MD and family desires to accept risk for comfort feeds. SLP  will follow up briefly for educatio re: safest swallow  precautions (upright position, no straws, 2 swallows using dry  spoon, sit up one hour after meals).       CHL IP TREATMENT RECOMMENDATION 07/12/2014  Treatment Recommendations Therapy as outlined in treatment plan  below     CHL IP DIET RECOMMENDATION 07/12/2014  SLP Diet Recommendations Dysphagia 1 (Puree)  Liquid Administration via (None)  Medication Administration Crushed with puree  Compensations Slow rate;Small sips/bites;Multiple dry swallows  after each bite/sip;Clear throat intermittently  Postural Changes and/or Swallow Maneuvers (None)     CHL IP OTHER RECOMMENDATIONS 07/12/2014  Recommended Consults (None)  Oral Care Recommendations Oral care BID Other Recommendations  (None)     CHL IP FOLLOW UP  RECOMMENDATIONS 07/11/2014  Follow up Recommendations Skilled Nursing facility  CHL IP FREQUENCY AND DURATION 07/12/2014  Speech Therapy Frequency (ACUTE ONLY) min 2x/week  Treatment Duration 2 weeks     Pertinent Vitals/Pain none    SLP Swallow Goals No flowsheet data found.  No flowsheet data found.    CHL IP REASON FOR REFERRAL 07/12/2014  Reason for Referral Objectively evaluate swallowing function     CHL IP ORAL PHASE 07/12/2014  Lips (None)  Tongue (None)  Mucous membranes (None)  Nutritional status (None)  Other (None)  Oxygen therapy (None)  Oral Phase Impaired  Oral - Pudding Teaspoon (None)  Oral - Pudding Cup (None)  Oral - Honey Teaspoon (None)  Oral - Honey Cup (None)  Oral - Honey Syringe (None)  Oral - Nectar Teaspoon (None)  Oral - Nectar Cup (None)  Oral - Nectar Straw (None)  Oral - Nectar Syringe (None)  Oral - Ice Chips (None)  Oral - Thin Teaspoon (None)  Oral - Thin Cup (None)  Oral - Thin Straw (None)  Oral - Thin Syringe (None)  Oral - Puree (None)  Oral - Mechanical Soft (None)  Oral - Regular (None)  Oral - Multi-consistency (None)  Oral - Pill (None)  Oral Phase - Comment (None)      CHL IP PHARYNGEAL PHASE 07/12/2014  Pharyngeal Phase Impaired  Pharyngeal - Pudding Teaspoon (None)  Penetration/Aspiration details (pudding teaspoon) (None)  Pharyngeal - Pudding Cup (None)  Penetration/Aspiration details (pudding cup) (None)  Pharyngeal - Honey Teaspoon (None)  Penetration/Aspiration details (honey teaspoon) (None)  Pharyngeal - Honey Cup (None)  Penetration/Aspiration details (honey cup) (None)  Pharyngeal - Honey Syringe (None)  Penetration/Aspiration details (honey syringe) (None)  Pharyngeal - Nectar Teaspoon (None)  Penetration/Aspiration details (nectar teaspoon) (None)  Pharyngeal - Nectar Cup (None)  Penetration/Aspiration details (nectar cup) (None)  Pharyngeal - Nectar Straw (None)  Penetration/Aspiration details (nectar straw) (None)  Pharyngeal - Nectar Syringe (None)   Penetration/Aspiration details (nectar syringe) (None)  Pharyngeal - Ice Chips (None)  Penetration/Aspiration details (ice chips) (None)  Pharyngeal - Thin Teaspoon (None)  Penetration/Aspiration details (thin teaspoon) (None)  Pharyngeal - Thin Cup (None)  Penetration/Aspiration details (thin cup) (None)  Pharyngeal - Thin Straw (None)  Penetration/Aspiration details (thin straw) (None)  Pharyngeal - Thin Syringe (None)  Penetration/Aspiration details (thin syringe') (None)  Pharyngeal - Puree (None)  Penetration/Aspiration details (puree) (None)  Pharyngeal - Mechanical Soft (None)  Penetration/Aspiration details (mechanical soft) (None)  Pharyngeal - Regular (None)  Penetration/Aspiration details (regular) (None)  Pharyngeal - Multi-consistency (None)  Penetration/Aspiration details (multi-consistency) (None)  Pharyngeal - Pill (None)  Penetration/Aspiration details (pill) (None) Pharyngeal Comment  (None)      CHL IP CERVICAL ESOPHAGEAL PHASE 07/12/2014  Cervical Esophageal Phase WFL  Pudding Teaspoon (None)  Pudding Cup (None)  Honey Teaspoon (None)  Honey Cup (None)  Honey Straw (None)  Nectar Teaspoon (None)  Nectar Cup (None)  Nectar Straw (None)  Nectar Sippy Cup (None)  Thin Teaspoon (None)  Thin Cup (None)  Thin Straw (None)  Thin [place]ippy Cup (None)  Cervical Esophageal Comment (None)    No flowsheet data found.         Houston Siren 07/12/2014, 10:31 AM  Orbie Pyo Colvin Caroli.Ed CCC-SLP Pager [phone]       PERTINENT LAB RESULTS: CBC: No results for input(s): WBC, HGB, HCT, PLT in the last 72 hours. CMET CMP     Component Value Date/Time   NA 144 07/11/2014 0400   K 3.5 07/11/2014 0400   CL 106 07/11/2014  0400   CO2 28 07/11/2014 0400   GLUCOSE 102* 07/11/2014 0400   BUN 7 07/11/2014 0400   CREATININE 0.65 07/11/2014 0400   CALCIUM 8.8* 07/11/2014 0400   PROT 5.9* 07/10/2014 0605   ALBUMIN 2.3* 07/10/2014 0605   AST 11* 07/10/2014 0605   ALT 16 07/10/2014 0605   ALKPHOS 48  07/10/2014 0605   BILITOT 0.6 07/10/2014 0605   GFRNONAA >60 07/11/2014 0400   GFRAA >60 07/11/2014 0400    GFR Estimated Creatinine Clearance: 65.2 mL/min (by C-G formula based on Cr of 0.65). No results for input(s): LIPASE, AMYLASE in the last 72 hours. No results for input(s): CKTOTAL, CKMB, CKMBINDEX, TROPONINI in the last 72 hours. Invalid input(s): POCBNP No results for input(s): DDIMER in the last 72 hours. No results for input(s): HGBA1C in the last 72 hours. No results for input(s): CHOL, HDL, LDLCALC, TRIG, CHOLHDL, LDLDIRECT in the last 72 hours. No results for input(s): TSH, T4TOTAL, T3FREE, THYROIDAB in the last 72 hours.  Invalid input(s): FREET3 No results for input(s): VITAMINB12, FOLATE, FERRITIN, TIBC, IRON, RETICCTPCT in the last 72 hours. Coags: No results for input(s): INR in the last 72 hours.  Invalid input(s): PT Microbiology: Recent Results (from the past 240 hour(s))  Urine culture     Status: None   Collection Time: 07/09/14  6:50 PM  Result Value Ref Range Status   Specimen Description URINE, CATHETERIZED  Final   Special Requests NONE  Final   Colony Count NO GROWTH Performed at [facility]   Final   Culture NO GROWTH Performed at [facility]   Final   Report Status 07/10/2014 FINAL  Final  Culture, blood (routine x 2)     Status: None (Preliminary result)   Collection Time: 07/09/14  9:34 PM  Result Value Ref Range Status   Specimen Description BLOOD LEFT ARM  Final   Special Requests BOTTLES DRAWN AEROBIC AND ANAEROBIC 5ML  Final   Culture   Final           BLOOD CULTURE RECEIVED NO GROWTH TO DATE CULTURE WILL BE HELD FOR 5 DAYS BEFORE ISSUING A FINAL NEGATIVE REPORT Performed at [facility]    Report Status PENDING  Incomplete  Culture, blood (routine x 2)     Status: None (Preliminary result)   Collection Time: 07/09/14  9:48 PM  Result Value Ref Range Status   Specimen Description BLOOD LEFT HAND  Final    Special Requests BOTTLES DRAWN AEROBIC AND ANAEROBIC 5ML  Final   Culture   Final           BLOOD CULTURE RECEIVED NO GROWTH TO DATE CULTURE WILL BE HELD FOR 5 DAYS BEFORE ISSUING A FINAL NEGATIVE REPORT Performed at [facility]    Report Status PENDING  Incomplete     BRIEF HOSPITAL COURSE:  Acute metabolic encephalopathy: Suspect secondary to dehydration and aspiration pneumonia. CT head negative for acute abnormalities. MRI brain negative for CVA. Significantly improved with IV antibiotics.  Suspected aspiration pneumonia: Initial chest x-ray negative for pneumonia, repeat chest x-ray post-hydration suggests right lower lobe infiltrate highly suspicious for aspiration pneumonia. Started on vancomycin and Zosyn on admission, since improved, cultures negative, we will transition to Augmentin on discharge. Clinically improved, mentation back to baseline.   Sepsis: Secondary to above. Sepsis physiology has resolved with IV antibiotics and IV fluids. Blood cultures negative so far.    Dysphagia: Suspect chronic dysphagia at baseline-apparently was on a dysphagia  3 diet prior to her last discharge. Suspect worsening dysphagia secondary to acute illness/progression of dementia. MRI brain negative for CVA. Seen by speech therapy, subsequently underwent a modified barium swallow which showed significant aspiration, have discussed with family on numerous occasions throughout this hospital stay-they do not desire any aggressive care-do not desire PEG tube-hence will cautiously continue with a dysphagia 1 diet. Daughter/family fully aware of risks of aspiration and accepting all risks. Will need palliative care follow-up once discharged to SNF. Patient will also need speech therapy follow-up while at SNF   Hypertension: Continue metoprolol, resume losartan. Follow BP and adjust accordingly while at SNF   Diabetes: Continue Levemir 5 units and SSI. CBGs stable.Follow CBGs closely and adjust  accordingly while at SNF  Chronic unstageable decubitus ulcer: Present prior to admission. Wound care consulted. Recommendations are to Cleanse sacrum and coccyx with NS and pat gently dry. APply Santyl ointment (1/8 inch thickness) (opaque) to wound beds. Cover with NS moist gauze. Secure with ABD pad and tape. Change daily.   Dementia: Mental status at baseline.   Palliative care: 74 year old black female with a history of dementia-nonambulatory-bed to wheelchair bound-who recently had septic shock along with respiratory failure requiring mechanical intubation. She was discharged from this facility on 07/04/14. Per daughter she was in good state of health for 2 days, then started becoming very lethargic. Subsequently readmitted to this facility on 5/18 with worsening encephalopathy from sepsis/aspiration pneumonia. Multiple discussions held with family (daughter and then spouse) during this hospital stay, family aware that patient has poor overall prognoses. DO NOT RESUSCITATE in place. After much discussion with family, family at this time does not desire aggressive care, family also does not desire PEG tube. Plans are to discharge to SNF with palliative care follow-up.   TODAY-DAY OF DISCHARGE:  Subjective:   [employer] today has no headache,no chest abdominal pain,no new weakness tingling or numbness  Objective:   Blood pressure 154/81, pulse 73, temperature 98.3 F (36.8 C), temperature source Oral, resp. rate 18, height 5\' 7"  (1.702 m), weight 77.5 kg (170 lb 13.7 oz), SpO2 96 %.  Intake/Output Summary (Last 24 hours) at 07/14/14 1014 Last data filed at 07/13/14 1740  Gross per 24 hour  Intake     20 ml  Output      0 ml  Net     20 ml   Filed Weights   07/09/14 2210 07/10/14 1352 07/13/14 0543  Weight: 76.8 kg (169 lb 5 oz) 76.8 kg (169 lb 5 oz) 77.5 kg (170 lb 13.7 oz)    Exam Awake Alert, No new F.N deficits, Normal affect [place].AT,PERRAL Supple Neck,No JVD, No  cervical lymphadenopathy appriciated.  Symmetrical Chest wall movement, Good air movement bilaterally, CTAB RRR,No Gallops,Rubs or new Murmurs, No Parasternal Heave +ve B.Sounds, Abd Soft, Non tender, No organomegaly appriciated, No rebound -guarding or rigidity. No Cyanosis, Clubbing or edema, No new Rash or bruise  DISCHARGE CONDITION: Stable  DISPOSITION: SNF  DISCHARGE INSTRUCTIONS:    Activity:  As tolerated with Full fall precautions use walker/cane & assistance as needed  Diet recommendation: Dysphagia 1 diet with strict aspiration precautions-(upright position, no straws, 2 swallows using dry spoon, sit up one hour after meals).   Administer medications crushed with Puree  Slow rate, small sip/bites, multiple dry swallows after each bite/sip. Clear throat intermittently.  CODE STATUS: DNR  Discharge Instructions    Call MD for:  persistant nausea and vomiting    Complete by:  As  directed      Diet - low sodium heart healthy    Complete by:  As directed   Needs to be propped up while eating-will need close supervision or even assistance with meals Please keep head end elevated atleast to 60 degrees at all times when lying in bed  Dysphagia 1 diet     Increase activity slowly    Complete by:  As directed   Please keep head end elevated atleast to 60 degrees at all times when lying in bed           Follow-up Information    Follow up with [name], MD. Schedule an appointment as soon as possible for a visit in 1 week.   Specialty:  Internal Medicine   Contact information:   [place] [id number] [phone]       Total Time spent on discharge equals 45 minutes.  SignedOren Binet 07/14/2014 10:14 AM

## 2014-07-14 NOTE — Care Management Note (Signed)
Case Management Note  Patient Details  Name: [name] MRN: [id number] Date of Birth: 03/30/1940  Subjective/Objective:        74 yo female admitted with acute encephalopathy            Action/Plan:  Discharge planning. CSW assisting with SNF placement Expected Discharge Date:  07/15/14               Expected Discharge Plan:  Skilled Nursing Facility  In-House Referral:  Clinical Social Work  Discharge planning Services  CM Consult  Post Acute Care Choice:  NA Choice offered to:  NA  DME Arranged:    DME Agency:     HH Arranged:    [place] Agency:     Status of Service:  Completed, signed off  Medicare Important Message Given:  Yes Date Medicare IM Given:  07/14/14 Medicare IM give by:  Leanne Chang Date Additional Medicare IM Given:    Additional Medicare Important Message give by:     If discussed at [place] of Stay Meetings, dates discussed:    Additional Comments:  Scot Dock, RN 07/14/2014, 12:44 PM

## 2014-07-14 NOTE — Clinical Social Work Note (Signed)
Patient for d/c today to SNF bed at [facility] as pta. Daughter and spouse agreeable to this plan- plan transfer via EMS. Eduard Clos, MSW, Latanya Presser [phone]

## 2014-07-14 NOTE — Progress Notes (Signed)
[facility] called for transport.     Adalynne Steffensmeier, LCSW [facility] Clinical Social Worker cell #: [phone]  

## 2014-07-15 DIAGNOSIS — I10 Essential (primary) hypertension: Secondary | ICD-10-CM | POA: Diagnosis not present

## 2014-07-15 DIAGNOSIS — R1310 Dysphagia, unspecified: Secondary | ICD-10-CM | POA: Diagnosis not present

## 2014-07-15 DIAGNOSIS — G9340 Encephalopathy, unspecified: Secondary | ICD-10-CM | POA: Diagnosis not present

## 2014-07-15 DIAGNOSIS — F0390 Unspecified dementia without behavioral disturbance: Secondary | ICD-10-CM | POA: Diagnosis not present

## 2014-07-15 DIAGNOSIS — E119 Type 2 diabetes mellitus without complications: Secondary | ICD-10-CM | POA: Diagnosis not present

## 2014-07-16 LAB — CULTURE, BLOOD (ROUTINE X 2)
Culture: NO GROWTH
Culture: NO GROWTH

## 2014-07-20 ENCOUNTER — Emergency Department (HOSPITAL_COMMUNITY): Payer: Medicare Other

## 2014-07-20 ENCOUNTER — Inpatient Hospital Stay (HOSPITAL_COMMUNITY)
Admission: EM | Admit: 2014-07-20 | Discharge: 2014-07-22 | DRG: 177 | Disposition: A | Discharge status: Skilled Nursing Facility | Payer: Medicare Other | Attending: Internal Medicine | Admitting: Internal Medicine

## 2014-07-20 ENCOUNTER — Encounter (HOSPITAL_COMMUNITY): Payer: Self-pay | Admitting: Emergency Medicine

## 2014-07-20 DIAGNOSIS — E876 Hypokalemia: Secondary | ICD-10-CM | POA: Diagnosis present

## 2014-07-20 DIAGNOSIS — F329 Major depressive disorder, single episode, unspecified: Secondary | ICD-10-CM | POA: Diagnosis present

## 2014-07-20 DIAGNOSIS — M1990 Unspecified osteoarthritis, unspecified site: Secondary | ICD-10-CM | POA: Diagnosis present

## 2014-07-20 DIAGNOSIS — J189 Pneumonia, unspecified organism: Secondary | ICD-10-CM

## 2014-07-20 DIAGNOSIS — R1310 Dysphagia, unspecified: Secondary | ICD-10-CM

## 2014-07-20 DIAGNOSIS — D72829 Elevated white blood cell count, unspecified: Secondary | ICD-10-CM | POA: Insufficient documentation

## 2014-07-20 DIAGNOSIS — G2581 Restless legs syndrome: Secondary | ICD-10-CM | POA: Diagnosis present

## 2014-07-20 DIAGNOSIS — Z515 Encounter for palliative care: Secondary | ICD-10-CM

## 2014-07-20 DIAGNOSIS — E870 Hyperosmolality and hypernatremia: Secondary | ICD-10-CM | POA: Diagnosis present

## 2014-07-20 DIAGNOSIS — E1122 Type 2 diabetes mellitus with diabetic chronic kidney disease: Secondary | ICD-10-CM | POA: Diagnosis present

## 2014-07-20 DIAGNOSIS — F419 Anxiety disorder, unspecified: Secondary | ICD-10-CM | POA: Diagnosis present

## 2014-07-20 DIAGNOSIS — G9340 Encephalopathy, unspecified: Secondary | ICD-10-CM | POA: Diagnosis not present

## 2014-07-20 DIAGNOSIS — Z882 Allergy status to sulfonamides status: Secondary | ICD-10-CM

## 2014-07-20 DIAGNOSIS — N184 Chronic kidney disease, stage 4 (severe): Secondary | ICD-10-CM | POA: Diagnosis not present

## 2014-07-20 DIAGNOSIS — R4702 Dysphasia: Secondary | ICD-10-CM | POA: Diagnosis present

## 2014-07-20 DIAGNOSIS — E1165 Type 2 diabetes mellitus with hyperglycemia: Secondary | ICD-10-CM | POA: Diagnosis present

## 2014-07-20 DIAGNOSIS — R1312 Dysphagia, oropharyngeal phase: Secondary | ICD-10-CM | POA: Diagnosis present

## 2014-07-20 DIAGNOSIS — R509 Fever, unspecified: Secondary | ICD-10-CM

## 2014-07-20 DIAGNOSIS — L89152 Pressure ulcer of sacral region, stage 2: Secondary | ICD-10-CM | POA: Diagnosis present

## 2014-07-20 DIAGNOSIS — Z66 Do not resuscitate: Secondary | ICD-10-CM | POA: Diagnosis present

## 2014-07-20 DIAGNOSIS — E860 Dehydration: Secondary | ICD-10-CM | POA: Insufficient documentation

## 2014-07-20 DIAGNOSIS — N179 Acute kidney failure, unspecified: Secondary | ICD-10-CM | POA: Diagnosis present

## 2014-07-20 DIAGNOSIS — D638 Anemia in other chronic diseases classified elsewhere: Secondary | ICD-10-CM | POA: Diagnosis present

## 2014-07-20 DIAGNOSIS — L8990 Pressure ulcer of unspecified site, unspecified stage: Secondary | ICD-10-CM | POA: Diagnosis present

## 2014-07-20 DIAGNOSIS — E119 Type 2 diabetes mellitus without complications: Secondary | ICD-10-CM

## 2014-07-20 DIAGNOSIS — N289 Disorder of kidney and ureter, unspecified: Secondary | ICD-10-CM

## 2014-07-20 DIAGNOSIS — J690 Pneumonitis due to inhalation of food and vomit: Secondary | ICD-10-CM | POA: Diagnosis not present

## 2014-07-20 DIAGNOSIS — I129 Hypertensive chronic kidney disease with stage 1 through stage 4 chronic kidney disease, or unspecified chronic kidney disease: Secondary | ICD-10-CM | POA: Diagnosis present

## 2014-07-20 DIAGNOSIS — Z7982 Long term (current) use of aspirin: Secondary | ICD-10-CM

## 2014-07-20 DIAGNOSIS — Z8701 Personal history of pneumonia (recurrent): Secondary | ICD-10-CM

## 2014-07-20 DIAGNOSIS — R4182 Altered mental status, unspecified: Secondary | ICD-10-CM | POA: Diagnosis not present

## 2014-07-20 DIAGNOSIS — Z794 Long term (current) use of insulin: Secondary | ICD-10-CM

## 2014-07-20 DIAGNOSIS — Y95 Nosocomial condition: Secondary | ICD-10-CM | POA: Diagnosis present

## 2014-07-20 DIAGNOSIS — Z885 Allergy status to narcotic agent status: Secondary | ICD-10-CM

## 2014-07-20 DIAGNOSIS — Z833 Family history of diabetes mellitus: Secondary | ICD-10-CM

## 2014-07-20 DIAGNOSIS — F0390 Unspecified dementia without behavioral disturbance: Secondary | ICD-10-CM | POA: Diagnosis present

## 2014-07-20 DIAGNOSIS — E780 Pure hypercholesterolemia: Secondary | ICD-10-CM | POA: Diagnosis present

## 2014-07-20 DIAGNOSIS — Z79899 Other long term (current) drug therapy: Secondary | ICD-10-CM

## 2014-07-20 LAB — COMPREHENSIVE METABOLIC PANEL
ALK PHOS: 63 U/L (ref 38–126)
ALT: 10 U/L — AB (ref 14–54)
ANION GAP: 10 (ref 5–15)
AST: 11 U/L — ABNORMAL LOW (ref 15–41)
Albumin: 3.3 g/dL — ABNORMAL LOW (ref 3.5–5.0)
BUN: 38 mg/dL — AB (ref 6–20)
CO2: 29 mmol/L (ref 22–32)
CREATININE: 2.68 mg/dL — AB (ref 0.44–1.00)
Calcium: 10 mg/dL (ref 8.9–10.3)
Chloride: 109 mmol/L (ref 101–111)
GFR calc non Af Amer: 16 mL/min — ABNORMAL LOW (ref >60)
GFR, EST AFRICAN AMERICAN: 19 mL/min — AB (ref >60)
GLUCOSE: 326 mg/dL — AB (ref 65–99)
Potassium: 4.8 mmol/L (ref 3.5–5.1)
Sodium: 148 mmol/L — ABNORMAL HIGH (ref 135–145)
TOTAL PROTEIN: 7.3 g/dL (ref 6.5–8.1)
Total Bilirubin: 0.7 mg/dL (ref 0.3–1.2)

## 2014-07-20 LAB — URINALYSIS, ROUTINE W REFLEX MICROSCOPIC
BILIRUBIN URINE: NEGATIVE
Glucose, UA: 250 mg/dL — AB
HGB URINE DIPSTICK: NEGATIVE
Ketones, ur: NEGATIVE mg/dL
Nitrite: NEGATIVE
PH: 6.5 (ref 5.0–8.0)
Protein, ur: 100 mg/dL — AB
SPECIFIC GRAVITY, URINE: 1.018 (ref 1.005–1.030)
Urobilinogen, UA: 0.2 mg/dL (ref 0.0–1.0)

## 2014-07-20 LAB — CBC WITH DIFFERENTIAL/PLATELET
Basophils Absolute: 0 10*3/uL (ref 0.0–0.1)
Basophils Relative: 0 % (ref 0–1)
EOS PCT: 0 % (ref 0–5)
Eosinophils Absolute: 0 10*3/uL (ref 0.0–0.7)
HEMATOCRIT: 32.1 % — AB (ref 36.0–46.0)
HEMOGLOBIN: 10 g/dL — AB (ref 12.0–15.0)
Lymphocytes Relative: 13 % (ref 12–46)
Lymphs Abs: 1.5 10*3/uL (ref 0.7–4.0)
MCH: 26.2 pg (ref 26.0–34.0)
MCHC: 31.2 g/dL (ref 30.0–36.0)
MCV: 84.3 fL (ref 78.0–100.0)
Monocytes Absolute: 0.5 10*3/uL (ref 0.1–1.0)
Monocytes Relative: 5 % (ref 3–12)
Neutro Abs: 9.5 10*3/uL — ABNORMAL HIGH (ref 1.7–7.7)
Neutrophils Relative %: 82 % — ABNORMAL HIGH (ref 43–77)
Platelets: 267 10*3/uL (ref 150–400)
RBC: 3.81 MIL/uL — ABNORMAL LOW (ref 3.87–5.11)
RDW: 15 % (ref 11.5–15.5)
WBC: 11.6 10*3/uL — AB (ref 4.0–10.5)

## 2014-07-20 LAB — I-STAT CG4 LACTIC ACID, ED: Lactic Acid, Venous: 1.27 mmol/L (ref 0.5–2.0)

## 2014-07-20 LAB — URINE MICROSCOPIC-ADD ON

## 2014-07-20 MED ORDER — SODIUM CHLORIDE 0.9 % IV BOLUS (SEPSIS)
1000.0000 mL | Freq: Once | INTRAVENOUS | Status: AC
  Administered 2014-07-20: 1000 mL via INTRAVENOUS

## 2014-07-20 MED ORDER — PIPERACILLIN-TAZOBACTAM 3.375 G IVPB
3.3750 g | Freq: Once | INTRAVENOUS | Status: AC
  Administered 2014-07-20: 3.375 g via INTRAVENOUS
  Filled 2014-07-20: qty 50

## 2014-07-20 MED ORDER — VANCOMYCIN HCL IN DEXTROSE 1-5 GM/200ML-% IV SOLN
1000.0000 mg | Freq: Once | INTRAVENOUS | Status: AC
  Administered 2014-07-21: 1000 mg via INTRAVENOUS
  Filled 2014-07-20: qty 200

## 2014-07-20 NOTE — ED Notes (Signed)
Pt's daughter here---- daughter reported that pt was alert yesterday and was feeding herself but now pt "is not responding well".

## 2014-07-20 NOTE — ED Notes (Signed)
Bed: WA08 Expected date:  Expected time:  Means of arrival:  Comments: EMS Fever

## 2014-07-20 NOTE — ED Notes (Signed)
Brought in by EMS from [facility] facility with c/o fever.  Staff at the facility reported that pt has been having intermittent fever for the past few days.  Pt has hx recurring pneumonia--- just completed her oral ABT on May 27.  Pt's temp tonight was T101--- was given Tylenol 1000 mg at 2030 by the facility.  Pt's daughter visited tonight--- very concerned on pt's recurring fever and requested emergency care.  Pt arrived to ED awake, appears lethargic, but staff at the facility reported that this pt's behavior has been her norm.

## 2014-07-21 ENCOUNTER — Encounter (HOSPITAL_COMMUNITY): Payer: Self-pay | Admitting: Internal Medicine

## 2014-07-21 DIAGNOSIS — F419 Anxiety disorder, unspecified: Secondary | ICD-10-CM | POA: Diagnosis present

## 2014-07-21 DIAGNOSIS — N179 Acute kidney failure, unspecified: Secondary | ICD-10-CM

## 2014-07-21 DIAGNOSIS — K3184 Gastroparesis: Secondary | ICD-10-CM | POA: Diagnosis not present

## 2014-07-21 DIAGNOSIS — E780 Pure hypercholesterolemia: Secondary | ICD-10-CM | POA: Diagnosis present

## 2014-07-21 DIAGNOSIS — A419 Sepsis, unspecified organism: Secondary | ICD-10-CM | POA: Diagnosis not present

## 2014-07-21 DIAGNOSIS — R1312 Dysphagia, oropharyngeal phase: Secondary | ICD-10-CM | POA: Diagnosis present

## 2014-07-21 DIAGNOSIS — R339 Retention of urine, unspecified: Secondary | ICD-10-CM | POA: Diagnosis not present

## 2014-07-21 DIAGNOSIS — M6281 Muscle weakness (generalized): Secondary | ICD-10-CM | POA: Diagnosis not present

## 2014-07-21 DIAGNOSIS — L89152 Pressure ulcer of sacral region, stage 2: Secondary | ICD-10-CM | POA: Diagnosis present

## 2014-07-21 DIAGNOSIS — Y95 Nosocomial condition: Secondary | ICD-10-CM | POA: Diagnosis present

## 2014-07-21 DIAGNOSIS — J302 Other seasonal allergic rhinitis: Secondary | ICD-10-CM | POA: Diagnosis not present

## 2014-07-21 DIAGNOSIS — N184 Chronic kidney disease, stage 4 (severe): Secondary | ICD-10-CM | POA: Diagnosis present

## 2014-07-21 DIAGNOSIS — Z885 Allergy status to narcotic agent status: Secondary | ICD-10-CM | POA: Diagnosis not present

## 2014-07-21 DIAGNOSIS — D638 Anemia in other chronic diseases classified elsewhere: Secondary | ICD-10-CM | POA: Diagnosis present

## 2014-07-21 DIAGNOSIS — Z882 Allergy status to sulfonamides status: Secondary | ICD-10-CM | POA: Diagnosis not present

## 2014-07-21 DIAGNOSIS — F0390 Unspecified dementia without behavioral disturbance: Secondary | ICD-10-CM

## 2014-07-21 DIAGNOSIS — E870 Hyperosmolality and hypernatremia: Secondary | ICD-10-CM | POA: Diagnosis present

## 2014-07-21 DIAGNOSIS — J9601 Acute respiratory failure with hypoxia: Secondary | ICD-10-CM | POA: Diagnosis not present

## 2014-07-21 DIAGNOSIS — M1990 Unspecified osteoarthritis, unspecified site: Secondary | ICD-10-CM | POA: Diagnosis present

## 2014-07-21 DIAGNOSIS — R509 Fever, unspecified: Secondary | ICD-10-CM | POA: Diagnosis not present

## 2014-07-21 DIAGNOSIS — E1165 Type 2 diabetes mellitus with hyperglycemia: Secondary | ICD-10-CM

## 2014-07-21 DIAGNOSIS — Z515 Encounter for palliative care: Secondary | ICD-10-CM | POA: Diagnosis not present

## 2014-07-21 DIAGNOSIS — I129 Hypertensive chronic kidney disease with stage 1 through stage 4 chronic kidney disease, or unspecified chronic kidney disease: Secondary | ICD-10-CM | POA: Diagnosis present

## 2014-07-21 DIAGNOSIS — G9340 Encephalopathy, unspecified: Secondary | ICD-10-CM | POA: Diagnosis not present

## 2014-07-21 DIAGNOSIS — Z66 Do not resuscitate: Secondary | ICD-10-CM | POA: Diagnosis present

## 2014-07-21 DIAGNOSIS — Z7982 Long term (current) use of aspirin: Secondary | ICD-10-CM | POA: Diagnosis not present

## 2014-07-21 DIAGNOSIS — J690 Pneumonitis due to inhalation of food and vomit: Secondary | ICD-10-CM | POA: Diagnosis not present

## 2014-07-21 DIAGNOSIS — E119 Type 2 diabetes mellitus without complications: Secondary | ICD-10-CM

## 2014-07-21 DIAGNOSIS — R4702 Dysphasia: Secondary | ICD-10-CM | POA: Diagnosis present

## 2014-07-21 DIAGNOSIS — Z79899 Other long term (current) drug therapy: Secondary | ICD-10-CM | POA: Diagnosis not present

## 2014-07-21 DIAGNOSIS — F418 Other specified anxiety disorders: Secondary | ICD-10-CM | POA: Diagnosis not present

## 2014-07-21 DIAGNOSIS — D72829 Elevated white blood cell count, unspecified: Secondary | ICD-10-CM | POA: Diagnosis not present

## 2014-07-21 DIAGNOSIS — R41841 Cognitive communication deficit: Secondary | ICD-10-CM | POA: Diagnosis not present

## 2014-07-21 DIAGNOSIS — I10 Essential (primary) hypertension: Secondary | ICD-10-CM | POA: Diagnosis not present

## 2014-07-21 DIAGNOSIS — R278 Other lack of coordination: Secondary | ICD-10-CM | POA: Diagnosis not present

## 2014-07-21 DIAGNOSIS — R1310 Dysphagia, unspecified: Secondary | ICD-10-CM

## 2014-07-21 DIAGNOSIS — N390 Urinary tract infection, site not specified: Secondary | ICD-10-CM | POA: Diagnosis not present

## 2014-07-21 DIAGNOSIS — Z8701 Personal history of pneumonia (recurrent): Secondary | ICD-10-CM | POA: Diagnosis not present

## 2014-07-21 DIAGNOSIS — Z833 Family history of diabetes mellitus: Secondary | ICD-10-CM | POA: Diagnosis not present

## 2014-07-21 DIAGNOSIS — E1122 Type 2 diabetes mellitus with diabetic chronic kidney disease: Secondary | ICD-10-CM | POA: Diagnosis present

## 2014-07-21 DIAGNOSIS — E876 Hypokalemia: Secondary | ICD-10-CM | POA: Diagnosis present

## 2014-07-21 DIAGNOSIS — R6520 Severe sepsis without septic shock: Secondary | ICD-10-CM | POA: Diagnosis not present

## 2014-07-21 DIAGNOSIS — Z794 Long term (current) use of insulin: Secondary | ICD-10-CM | POA: Diagnosis not present

## 2014-07-21 DIAGNOSIS — E559 Vitamin D deficiency, unspecified: Secondary | ICD-10-CM | POA: Diagnosis not present

## 2014-07-21 DIAGNOSIS — F329 Major depressive disorder, single episode, unspecified: Secondary | ICD-10-CM | POA: Diagnosis present

## 2014-07-21 DIAGNOSIS — G2581 Restless legs syndrome: Secondary | ICD-10-CM | POA: Diagnosis present

## 2014-07-21 DIAGNOSIS — E860 Dehydration: Secondary | ICD-10-CM | POA: Diagnosis not present

## 2014-07-21 LAB — GLUCOSE, CAPILLARY
GLUCOSE-CAPILLARY: 114 mg/dL — AB (ref 65–99)
GLUCOSE-CAPILLARY: 189 mg/dL — AB (ref 65–99)
GLUCOSE-CAPILLARY: 220 mg/dL — AB (ref 65–99)
GLUCOSE-CAPILLARY: 221 mg/dL — AB (ref 65–99)
Glucose-Capillary: 267 mg/dL — ABNORMAL HIGH (ref 65–99)
Glucose-Capillary: 242 mg/dL — ABNORMAL HIGH (ref 65–99)
Glucose-Capillary: 142 mg/dL — ABNORMAL HIGH (ref 65–99)

## 2014-07-21 LAB — COMPREHENSIVE METABOLIC PANEL
ALBUMIN: 3.1 g/dL — AB (ref 3.5–5.0)
ALT: 8 U/L — ABNORMAL LOW (ref 14–54)
ANION GAP: 7 (ref 5–15)
AST: 8 U/L — ABNORMAL LOW (ref 15–41)
Alkaline Phosphatase: 51 U/L (ref 38–126)
BILIRUBIN TOTAL: 0.6 mg/dL (ref 0.3–1.2)
BUN: 37 mg/dL — AB (ref 6–20)
CO2: 28 mmol/L (ref 22–32)
Calcium: 9.3 mg/dL (ref 8.9–10.3)
Chloride: 114 mmol/L — ABNORMAL HIGH (ref 101–111)
Creatinine, Ser: 2.38 mg/dL — ABNORMAL HIGH (ref 0.44–1.00)
GFR, EST AFRICAN AMERICAN: 22 mL/min — AB (ref >60)
GFR, EST NON AFRICAN AMERICAN: 19 mL/min — AB (ref >60)
GLUCOSE: 230 mg/dL — AB (ref 65–99)
POTASSIUM: 3.7 mmol/L (ref 3.5–5.1)
Sodium: 149 mmol/L — ABNORMAL HIGH (ref 135–145)
Total Protein: 6.6 g/dL (ref 6.5–8.1)

## 2014-07-21 LAB — CBC
HCT: 29.6 % — ABNORMAL LOW (ref 36.0–46.0)
HEMOGLOBIN: 9 g/dL — AB (ref 12.0–15.0)
MCH: 26.2 pg (ref 26.0–34.0)
MCHC: 30.4 g/dL (ref 30.0–36.0)
MCV: 86 fL (ref 78.0–100.0)
PLATELETS: 221 10*3/uL (ref 150–400)
RBC: 3.44 MIL/uL — AB (ref 3.87–5.11)
RDW: 15.2 % (ref 11.5–15.5)
WBC: 7.7 10*3/uL (ref 4.0–10.5)

## 2014-07-21 MED ORDER — HYDRALAZINE HCL 20 MG/ML IJ SOLN: 5.0000 mg | Freq: Four times a day (QID) | INTRAMUSCULAR | Status: DC | PRN

## 2014-07-21 MED ORDER — ONDANSETRON HCL 4 MG PO TABS: 4.0000 mg | ORAL_TABLET | Freq: Four times a day (QID) | ORAL | Status: DC | PRN

## 2014-07-21 MED ORDER — VANCOMYCIN HCL IN DEXTROSE 1-5 GM/200ML-% IV SOLN
1000.0000 mg | INTRAVENOUS | Status: DC
  Administered 2014-07-21: 1000 mg via INTRAVENOUS
  Filled 2014-07-21 (×2): qty 200

## 2014-07-21 MED ORDER — SODIUM CHLORIDE 0.9 % IV BOLUS (SEPSIS)
1000.0000 mL | Freq: Once | INTRAVENOUS | Status: AC
  Administered 2014-07-21: 1000 mL via INTRAVENOUS

## 2014-07-21 MED ORDER — ONDANSETRON HCL 4 MG/2ML IJ SOLN: 4.0000 mg | Freq: Four times a day (QID) | INTRAMUSCULAR | Status: DC | PRN

## 2014-07-21 MED ORDER — ACETAMINOPHEN 650 MG RE SUPP: 650.0000 mg | Freq: Four times a day (QID) | RECTAL | Status: DC | PRN

## 2014-07-21 MED ORDER — INSULIN DETEMIR 100 UNIT/ML ~~LOC~~ SOLN
5.0000 [IU] | Freq: Every day | SUBCUTANEOUS | Status: DC
  Administered 2014-07-21 (×2): 5 [IU] via SUBCUTANEOUS
  Filled 2014-07-21 (×3): qty 0.05

## 2014-07-21 MED ORDER — PIPERACILLIN-TAZOBACTAM 3.375 G IVPB
3.3750 g | Freq: Three times a day (TID) | INTRAVENOUS | Status: DC
  Administered 2014-07-21 – 2014-07-22 (×4): 3.375 g via INTRAVENOUS
  Filled 2014-07-21 (×6): qty 50

## 2014-07-21 MED ORDER — INSULIN ASPART 100 UNIT/ML ~~LOC~~ SOLN
0.0000 [IU] | SUBCUTANEOUS | Status: DC
  Administered 2014-07-21: 8 [IU] via SUBCUTANEOUS
  Administered 2014-07-21: 2 [IU] via SUBCUTANEOUS
  Administered 2014-07-21 (×2): 5 [IU] via SUBCUTANEOUS
  Administered 2014-07-22: 3 [IU] via SUBCUTANEOUS
  Administered 2014-07-22: 2 [IU] via SUBCUTANEOUS
  Administered 2014-07-22: 8 [IU] via SUBCUTANEOUS

## 2014-07-21 MED ORDER — ACETAMINOPHEN 325 MG PO TABS: 650.0000 mg | ORAL_TABLET | Freq: Four times a day (QID) | ORAL | Status: DC | PRN

## 2014-07-21 MED ORDER — SODIUM CHLORIDE 0.9 % IJ SOLN: 10.0000 mL | INTRAMUSCULAR | Status: DC | PRN

## 2014-07-21 MED ORDER — SODIUM CHLORIDE 0.9 % IV SOLN
INTRAVENOUS | Status: DC
  Administered 2014-07-21 (×2): via INTRAVENOUS

## 2014-07-21 NOTE — Progress Notes (Signed)
Initial Nutrition Assessment  DOCUMENTATION CODES:  Not applicable  INTERVENTION: - Will monitor for diet advancement and needs at that time  NUTRITION DIAGNOSIS:  Inadequate oral intake related to inability to eat as evidenced by NPO status.  GOAL:  Patient will meet greater than or equal to 90% of their needs  MONITOR:  Diet advancement, Weight trends, Labs, I & O's  REASON FOR ASSESSMENT:  Malnutrition Screening Tool  ASSESSMENT: 74 y.o. female with a past medical history of dementia, diabetes, hypertension, who was recently discharged on May 23 after being managed for suspected aspiration pneumonia, sepsis, dysphagia, hypertension. She also has a history of a decubitus ulcer. Discussions were held with this family regarding palliative care and hospice. This was supposed to be obtained at the skilled nursing facility to which she was discharged.   Pt seen for MST. BMI indicates overweight status. Pt no family present at time of visit and pt unable to provide information due to dementia at baseline and encephalopathy since admission. Per daughter's report in notes, pt did not have anything to eat yesterday. Pt unable to meet needs currently with NPO status. Will monitor for GOC and diet advancement.  Physical assessment did not show muscle or fat wasting or edema. Medications reviewed. Labs reviewed; CBGs: 121-242 mg/dL, Na: 149 mmol/L, Cl: 114 mmol/L, BUN/creatinine elevated, GFR: 22.  Height:  Ht Readings from Last 1 Encounters:  07/20/14 5\' 7"  (1.702 m)    Weight:  Wt Readings from Last 1 Encounters:  07/20/14 180 lb (81.647 kg)    Ideal Body Weight:  61.4 kg (kg)  Wt Readings from Last 10 Encounters:  07/20/14 180 lb (81.647 kg)  07/13/14 170 lb 13.7 oz (77.5 kg)  07/04/14 172 lb 13.5 oz (78.4 kg)  05/27/14 176 lb 9.4 oz (80.1 kg)  07/18/13 218 lb 0.6 oz (98.9 kg)  04/29/13 208 lb (94.348 kg)  12/28/12 215 lb (97.523 kg)  04/18/12 219 lb 9.3 oz (99.6 kg)   06/15/10 194 lb (87.998 kg)  06/03/10 180 lb (81.647 kg)    BMI:  Body mass index is 28.19 kg/(m^2).  Estimated Nutritional Needs:  Kcal:  1400-1600  Protein:  65-80 grams  Fluid:  2 L/day  Skin:  Wound (see comment) (stage 2 sacral pressure ulcer, wound to L buttocks)  Diet Order:  Diet NPO time specified  EDUCATION NEEDS:  No education needs identified at this time   Intake/Output Summary (Last 24 hours) at 07/21/14 1338 Last data filed at 07/21/14 0028  Gross per 24 hour  Intake   1000 ml  Output      0 ml  Net   1000 ml    Last BM:  5/21 per chart review   Jarome Matin, RD, LDN Inpatient Clinical Dietitian Pager # [phone] After hours/weekend pager # [phone]

## 2014-07-21 NOTE — Progress Notes (Signed)
ANTIBIOTIC CONSULT NOTE - INITIAL  Pharmacy Consult for Vancomycin and Zosyn  Indication: pneumonia  Allergies  Allergen Reactions  . Morphine And Related Other (See Comments)    hallucination   . Oxycodone Other (See Comments)    hallucination   . Sulfonamide Derivatives Swelling    Patient Measurements: Height: 5\' 7"  (170.2 cm) Weight: 180 lb (81.647 kg) IBW/kg (Calculated) : 61.6 Adjusted Body Weight:   Vital Signs: Temp: 98 F (36.7 C) (05/30 0100) Temp Source: Axillary (05/30 0100) BP: 125/82 mmHg (05/30 0100) Pulse Rate: 67 (05/30 0100) Intake/Output from previous day: 05/29 0701 - 05/30 0700 In: 1000 [I.V.:1000] Out: -  Intake/Output from this shift: Total I/O In: 1000 [I.V.:1000] Out: -   Labs:  Recent Labs  07/20/14 2222  WBC 11.6*  HGB 10.0*  PLT 267  CREATININE 2.68*   Estimated Creatinine Clearance: 19.9 mL/min (by C-G formula based on Cr of 2.68). No results for input(s): VANCOTROUGH, VANCOPEAK, VANCORANDOM, GENTTROUGH, GENTPEAK, GENTRANDOM, TOBRATROUGH, TOBRAPEAK, TOBRARND, AMIKACINPEAK, AMIKACINTROU, AMIKACIN in the last 72 hours.   Microbiology: Recent Results (from the past 720 hour(s))  Culture, respiratory (NON-Expectorated)     Status: None   Collection Time: 06/30/14 12:03 AM  Result Value Ref Range Status   Specimen Description SPUTUM  Final   Special Requests NONE  Final   Gram Stain   Final    ABUNDANT WBC PRESENT,BOTH PMN AND MONONUCLEAR ABUNDANT SQUAMOUS EPITHELIAL CELLS PRESENT ABUNDANT GRAM POSITIVE COCCI IN PAIRS IN CHAINS ABUNDANT GRAM POSITIVE RODS Gram Stain Report Called to,Read Back By and Verified With: Gram Stain Report Called to,Read Back By and Verified With: DENA.A AT 10:48 ON [id number] BY WEBSP    Culture   Final    ABUNDANT METHICILLIN RESISTANT STAPHYLOCOCCUS AUREUS Note: RIFAMPIN AND GENTAMICIN SHOULD NOT BE USED AS SINGLE DRUGS FOR TREATMENT OF STAPH INFECTIONS. Performed at [facility]    Report  Status 07/03/2014 FINAL  Final   Organism ID, Bacteria METHICILLIN RESISTANT STAPHYLOCOCCUS AUREUS  Final      Susceptibility   Methicillin resistant staphylococcus aureus - MIC*    CLINDAMYCIN >=8 RESISTANT Resistant     ERYTHROMYCIN >=8 RESISTANT Resistant     GENTAMICIN <=0.5 SENSITIVE Sensitive     LEVOFLOXACIN >=8 RESISTANT Resistant     OXACILLIN >=4 RESISTANT Resistant     PENICILLIN >=0.5 RESISTANT Resistant     RIFAMPIN <=0.5 SENSITIVE Sensitive     TRIMETH/SULFA <=10 SENSITIVE Sensitive     VANCOMYCIN 1 SENSITIVE Sensitive     TETRACYCLINE <=1 SENSITIVE Sensitive     * ABUNDANT METHICILLIN RESISTANT STAPHYLOCOCCUS AUREUS  Culture, blood (routine x 2)     Status: None   Collection Time: 06/30/14 12:10 AM  Result Value Ref Range Status   Specimen Description BLOOD LAC  Final   Special Requests BOTTLES DRAWN AEROBIC AND ANAEROBIC 5ML  Final   Culture   Final    STAPHYLOCOCCUS SPECIES (COAGULASE NEGATIVE) Note: THE SIGNIFICANCE OF ISOLATING THIS ORGANISM FROM A SINGLE SET OF BLOOD CULTURES WHEN MULTIPLE SETS ARE DRAWN IS UNCERTAIN. PLEASE NOTIFY THE MICROBIOLOGY DEPARTMENT WITHIN ONE WEEK IF SPECIATION AND SENSITIVITIES ARE REQUIRED. Note: Gram Stain Report Called to,Read Back By and Verified With: [facility] AT 6:41 A.M. ON 07/01/2014 WARRB Performed at [facility]    Report Status 07/02/2014 FINAL  Final  Culture, blood (routine x 2)     Status: None   Collection Time: 06/30/14 12:20 AM  Result Value Ref Range Status  Specimen Description BLOOD RIGHT HAND  Final   Special Requests BOTTLES DRAWN AEROBIC AND ANAEROBIC 5ML  Final   Culture   Final    NO GROWTH 5 DAYS Performed at [facility]    Report Status 07/06/2014 FINAL  Final  MRSA PCR Screening     Status: Abnormal   Collection Time: 06/30/14  5:30 AM  Result Value Ref Range Status   MRSA by PCR POSITIVE (A) NEGATIVE Final    Comment:        The GeneXpert MRSA Assay (FDA approved for NASAL  specimens only), is one component of a comprehensive MRSA colonization surveillance program. It is not intended to diagnose MRSA infection nor to guide or monitor treatment for MRSA infections. RESULT CALLED TO, READ BACK BY AND VERIFIED WITH: [place] RN AT 0705 ON 05.09.16 BY SHUEA   Urine culture     Status: None   Collection Time: 06/30/14  6:02 AM  Result Value Ref Range Status   Specimen Description URINE, CATHETERIZED  Final   Special Requests NONE  Final   Colony Count   Final    75,000 COLONIES/ML Performed at [facility]    Culture YEAST Performed at [facility]   Final   Report Status 07/01/2014 FINAL  Final  Culture, respiratory (tracheal aspirate)     Status: None   Collection Time: 06/30/14 11:17 AM  Result Value Ref Range Status   Specimen Description TRACHEAL ASPIRATE  Final   Special Requests NONE  Final   Gram Stain   Final    ABUNDANT WBC PRESENT, PREDOMINANTLY PMN RARE SQUAMOUS EPITHELIAL CELLS PRESENT FEW GRAM POSITIVE COCCI IN PAIRS FEW GRAM VARIABLE ROD Gram Stain Report Called to,Read Back By and Verified With: Gram Stain Report Called to,Read Back By and Verified With: KATIE.V AT [place] ON [id number] BY WEBSP    Culture   Final    ABUNDANT METHICILLIN RESISTANT STAPHYLOCOCCUS AUREUS Note: RIFAMPIN AND GENTAMICIN SHOULD NOT BE USED AS SINGLE DRUGS FOR TREATMENT OF STAPH INFECTIONS. MODERATE CANDIDA ALBICANS Performed at [facility]    Report Status 07/04/2014 FINAL  Final   Organism ID, Bacteria METHICILLIN RESISTANT STAPHYLOCOCCUS AUREUS  Final      Susceptibility   Methicillin resistant staphylococcus aureus - MIC*    CLINDAMYCIN >=8 RESISTANT Resistant     ERYTHROMYCIN >=8 RESISTANT Resistant     GENTAMICIN <=0.5 SENSITIVE Sensitive     LEVOFLOXACIN >=8 RESISTANT Resistant     OXACILLIN >=4 RESISTANT Resistant     PENICILLIN >=0.5 RESISTANT Resistant     RIFAMPIN <=0.5 SENSITIVE Sensitive     TRIMETH/SULFA <=10  SENSITIVE Sensitive     VANCOMYCIN <=0.5 SENSITIVE Sensitive     TETRACYCLINE <=1 SENSITIVE Sensitive     * ABUNDANT METHICILLIN RESISTANT STAPHYLOCOCCUS AUREUS  Clostridium Difficile by PCR     Status: None   Collection Time: 07/02/14 11:07 AM  Result Value Ref Range Status   C difficile by pcr NEGATIVE NEGATIVE Final  Urine culture     Status: None   Collection Time: 07/09/14  6:50 PM  Result Value Ref Range Status   Specimen Description URINE, CATHETERIZED  Final   Special Requests NONE  Final   Colony Count NO GROWTH Performed at [facility]   Final   Culture NO GROWTH Performed at [facility]   Final   Report Status 07/10/2014 FINAL  Final  Culture, blood (routine x 2)     Status: None  Collection Time: 07/09/14  9:34 PM  Result Value Ref Range Status   Specimen Description BLOOD LEFT ARM  Final   Special Requests BOTTLES DRAWN AEROBIC AND ANAEROBIC 5ML  Final   Culture   Final    NO GROWTH 5 DAYS Performed at [facility]    Report Status 02-03-202016 FINAL  Final  Culture, blood (routine x 2)     Status: None   Collection Time: 07/09/14  9:48 PM  Result Value Ref Range Status   Specimen Description BLOOD LEFT HAND  Final   Special Requests BOTTLES DRAWN AEROBIC AND ANAEROBIC 5ML  Final   Culture   Final    NO GROWTH 5 DAYS Performed at [facility]    Report Status 02-03-202016 FINAL  Final    Medical History: Past Medical History  Diagnosis Date  . Anxiety   . Dyspnea   . Diabetes mellitus   . Osteoarthritis   . Hypercholesteremia   . Depression   . RLS (restless legs syndrome)   . Stress   . PONV (postoperative nausea and vomiting)   . Headache(784.0)   . HTN (hypertension)     dr Johnsie Cancel    Medications:  Anti-infectives    Start     Dose/Rate Route Frequency Ordered Stop   07/21/14 2200  vancomycin (VANCOCIN) IVPB 1000 mg/200 mL premix     1,000 mg 200 mL/hr over 60 Minutes Intravenous Every 24 hours 07/21/14 0241      07/21/14 0600  piperacillin-tazobactam (ZOSYN) IVPB 3.375 g     3.375 g 12.5 mL/hr over 240 Minutes Intravenous 3 times per day 07/21/14 0241     07/21/14 0000  vancomycin (VANCOCIN) IVPB 1000 mg/200 mL premix     1,000 mg 200 mL/hr over 60 Minutes Intravenous  Once 07/20/14 2346 07/21/14 0130   07/21/14 0000  piperacillin-tazobactam (ZOSYN) IVPB 3.375 g     3.375 g 12.5 mL/hr over 240 Minutes Intravenous  Once 07/20/14 2346 07/21/14 0028     Assessment: Patient with recurrent Asp. PNA, possibly from dysphagia.  Renal function is very poor at this time. First dose of antibiotics already given.    Goal of Therapy:  Vancomycin trough level 15-20 mcg/ml  Zosyn based on renal function   Plan:  Measure antibiotic drug levels at steady state Follow up culture results Vancomycin 1gm iv q24h  Zosyn 3.375g IV Q8H infused over 4hrs.   Tyler Deis, Shea Stakes [place] 07/21/2014,2:41 AM

## 2014-07-21 NOTE — H&P (Signed)
[facility] History and Physical  ARIE GABLE FWY:[id number] DOB: [birth year] DOA: 07/20/2014   PCP: [name], MD  Specialists: None  Chief Complaint: Fever and cough  HPI: [name] is a 74 y.o. female with a past medical history of dementia, diabetes, hypertension, who was recently discharged on May 23 after being managed for suspected aspiration pneumonia, sepsis, dysphagia, hypertension. She also has a history of a decubitus ulcer. Discussions were held with this family regarding palliative care and hospice. This was supposed to be obtained at the skilled nursing facility to which she was discharged. Most of the history is provided by the daughter. Patient has moderate to advanced dementia and is encephalopathic and unable to provide any history. Apparently, the patient was doing well the initial few days after discharge from the hospital, but then on Friday she started declining. On Saturday, she wasn't communicating and then not so much today as well. She also had a fever at the nursing facility. Family requested that the patient be brought back into the hospital. Daughter also noticed cough. Patient looks at me but does not communicate. No further information is available. History is very limited.  Home Medications: Prior to Admission medications   Medication Sig Start Date End Date Taking? Authorizing Provider  amLODipine (NORVASC) 5 MG tablet Take 1 tablet (5 mg total) by mouth daily. 07/14/14  Yes Shanker Kristeen Mans, MD  aspirin 81 MG chewable tablet Chew 81 mg by mouth daily.   Yes Historical Provider, MD  B Complex Vitamins (B-COMPLEX/B-12 PO) Take 1 tablet by mouth daily.    Yes Historical Provider, MD  cholecalciferol (VITAMIN D) 1000 UNITS tablet Take 1,000 Units by mouth daily.   Yes Historical Provider, MD  Cranberry-Vitamin C-Probiotic (AZO CRANBERRY) 250-30 MG TABS Take 1 tablet by mouth daily.   Yes Historical Provider, MD  ergocalciferol (VITAMIN  D2) 50000 UNITS capsule Take 50,000 Units by mouth once a week.   Yes Historical Provider, MD  insulin aspart (NOVOLOG) 100 UNIT/ML injection Inject 0-9 Units into the skin 3 (three) times daily with meals. CBG < 70: implement hypoglycemia protocol CBG 70 - 120: 0 units CBG 121 - 150: 1 unit CBG 151 - 200: 2 units CBG 201 - 250: 3 units CBG 251 - 300: 5 units CBG 301 - 350: 7 units CBG 351 - 400: 9 units CBG > 400: call MD 07/14/14  Yes Shanker Kristeen Mans, MD  insulin detemir (LEVEMIR) 100 UNIT/ML injection Inject 0.05 mLs (5 Units total) into the skin at bedtime. Patient taking differently: Inject 10 Units into the skin daily.  07/14/14  Yes Shanker Kristeen Mans, MD  loperamide (IMODIUM) 2 MG capsule Take 2 mg by mouth as needed for diarrhea or loose stools. Do not exceed 8 doses in 24 hours   Yes Historical Provider, MD  losartan (COZAAR) 100 MG tablet Take 100 mg by mouth daily.   Yes Historical Provider, MD  Magnesium Oxide 500 MG TABS Take 500 mg by mouth daily.    Yes Historical Provider, MD  metoprolol succinate (TOPROL-XL) 50 MG 24 hr tablet Take 1 tablet (50 mg total) by mouth daily. 07/04/14  Yes Robbie Lis, MD  mirtazapine (REMERON) 7.5 MG tablet Take 1 tablet (7.5 mg total) by mouth at bedtime. 07/04/14  Yes Robbie Lis, MD  omeprazole (PRILOSEC) 40 MG capsule Take 40 mg by mouth daily.   Yes Historical Provider, MD  polyethylene glycol (MIRALAX / GLYCOLAX) packet Take 17 g by  mouth daily. 07/26/13  Yes [name], MD  potassium chloride SA (K-DUR,KLOR-CON) 20 MEQ tablet Take 40 mEq by mouth daily.    Yes Historical Provider, MD  pramipexole (MIRAPEX) 0.125 MG tablet Take 3 tablets (0.375 mg total) by mouth 3 (three) times daily. 07/04/14  Yes Robbie Lis, MD  acetaminophen (TYLENOL) 500 MG tablet Take 500 mg by mouth every 4 (four) hours as needed for mild pain, fever or headache (99.5 to 101).     Historical Provider, MD  ALPRAZolam Duanne Moron) 0.5 MG tablet Take 1 tablet (0.5 mg  total) by mouth 2 (two) times daily as needed for anxiety. 07/14/14   Shanker Kristeen Mans, MD  amoxicillin-clavulanate (AUGMENTIN) 875-125 MG per tablet Take 1 tablet by mouth 2 (two) times daily. 2 more days from 07/14/14. Patient not taking: Reported on 07/20/2014 07/14/14   Jonetta Osgood, MD  bisacodyl (DULCOLAX) 10 MG suppository Place 10 mg rectally as needed for moderate constipation.    Historical Provider, MD  collagenase (SANTYL) ointment Apply topically daily. Cleanse sacrum and coccyx with NS and pat gently dry.  APply Santyl ointment (1/8 inch thickness) (opaque) to wound beds.  Cover with NS moist gauze.  Secure with ABD pad and tape.  Change daily.  Offload heel pressure with pillow under calves.  Turn and reposition every 2 hours. Patient not taking: Reported on 07/20/2014 07/14/14   Jonetta Osgood, MD  feeding supplement, ENSURE ENLIVE, (ENSURE ENLIVE) LIQD Take 237 mLs by mouth 2 (two) times daily between meals. Patient not taking: Reported on 07/09/2014 07/04/14   Robbie Lis, MD  loratadine (CLARITIN) 10 MG tablet Take 10 mg by mouth daily as needed for allergies.     Historical Provider, MD  ondansetron (ZOFRAN) 4 MG tablet Take 4 mg by mouth every 8 (eight) hours as needed for nausea or vomiting.     Historical Provider, MD  senna-docusate (SENOKOT-S) 8.6-50 MG per tablet Take 1 tablet by mouth at bedtime as needed for mild constipation. 07/26/13   [name], MD    Allergies:  Allergies  Allergen Reactions  . Morphine And Related Other (See Comments)    hallucination   . Oxycodone Other (See Comments)    hallucination   . Sulfonamide Derivatives Swelling    Past Medical History: Past Medical History  Diagnosis Date  . Anxiety   . Dyspnea   . Diabetes mellitus   . Osteoarthritis   . Hypercholesteremia   . Depression   . RLS (restless legs syndrome)   . Stress   . PONV (postoperative nausea and vomiting)   . Headache(784.0)   . HTN (hypertension)     dr  Johnsie Cancel    Past Surgical History  Procedure Laterality Date  . Colonoscopy    . Abdominal hysterectomy    . Lumbar laminectomy/decompression microdiscectomy N/A 04/18/2012    Procedure: LUMBAR LAMINECTOMY/DECOMPRESSION MICRODISCECTOMY 3 LEVELS;  Surgeon: Winfield Cunas, MD;  Location: [facility];  Service: Neurosurgery;  Laterality: N/A;  Lumbar three-four, lumbar four-five, lumbar five-sacral one decompression. Synovial cyst resection lumbar four-five  . Joint replacement    . Left forearm fracture with orif Left   . Replacement total knee      right    Social History: She lives in a skilled nursing facility. She is unable to walk. She gets around in a wheelchair.   Family History:  Family History  Problem Relation Age of Onset  . Diabetes Father   . High blood  pressure Father   . High blood pressure Mother      Review of Systems - unable to obtain due to dementia and acute encephalopathy  Physical Examination  Filed Vitals:   07/20/14 2149 07/20/14 2200 07/21/14 0014  BP: 148/84  151/77  Pulse: 95  78  Temp: 100.8 F (38.2 C)    TempSrc: Rectal    Resp: 18  18  Height:  5' 7"  (1.702 m)   Weight:  81.647 kg (180 lb)   SpO2: 97%  97%    BP 151/77 mmHg  Pulse 78  Temp(Src) 100.8 F (38.2 C) (Rectal)  Resp 18  Ht 5' 7"  (1.702 m)  Wt 81.647 kg (180 lb)  BMI 28.19 kg/m2  SpO2 97%  General appearance: alert, distracted, no distress and uncooperative Head: Normocephalic, without obvious abnormality, atraumatic. Neck is soft and supple. Eyes: conjunctivae/corneas clear. PERRL, EOM's intact.  Throat: lips, mucosa, and tongue normal; teeth and gums normal Resp: Crackles right base. No wheezing. Few rhonchi. Cardio: regular rate and rhythm, S1, S2 normal, no murmur, click, rub or gallop GI: soft, non-tender; bowel sounds normal; no masses,  no organomegaly Extremities: extremities normal, atraumatic, no cyanosis or edema Pulses: 2+ and symmetric Skin: Skin color,  texture, turgor normal. No rashes or lesions Lymph nodes: Cervical, supraclavicular, and axillary nodes normal. Neurologic: Eyes open. No obvious focal neurological deficit. Does not communicate.  Laboratory Data: Results for orders placed or performed during the hospital encounter of 07/20/14 (from the past 48 hour(s))  CBC WITH DIFFERENTIAL     Status: Abnormal   Collection Time: 07/20/14 10:22 PM  Result Value Ref Range   WBC 11.6 (H) 4.0 - 10.5 K/uL   RBC 3.81 (L) 3.87 - 5.11 MIL/uL   Hemoglobin 10.0 (L) 12.0 - 15.0 g/dL   HCT 32.1 (L) 36.0 - 46.0 %   MCV 84.3 78.0 - 100.0 fL   MCH 26.2 26.0 - 34.0 pg   MCHC 31.2 30.0 - 36.0 g/dL   RDW 15.0 11.5 - 15.5 %   Platelets 267 150 - 400 K/uL   Neutrophils Relative % 82 (H) 43 - 77 %   Neutro Abs 9.5 (H) 1.7 - 7.7 K/uL   Lymphocytes Relative 13 12 - 46 %   Lymphs Abs 1.5 0.7 - 4.0 K/uL   Monocytes Relative 5 3 - 12 %   Monocytes Absolute 0.5 0.1 - 1.0 K/uL   Eosinophils Relative 0 0 - 5 %   Eosinophils Absolute 0.0 0.0 - 0.7 K/uL   Basophils Relative 0 0 - 1 %   Basophils Absolute 0.0 0.0 - 0.1 K/uL  Comprehensive metabolic panel     Status: Abnormal   Collection Time: 07/20/14 10:22 PM  Result Value Ref Range   Sodium 148 (H) 135 - 145 mmol/L   Potassium 4.8 3.5 - 5.1 mmol/L   Chloride 109 101 - 111 mmol/L   CO2 29 22 - 32 mmol/L   Glucose, Bld 326 (H) 65 - 99 mg/dL   BUN 38 (H) 6 - 20 mg/dL   Creatinine, Ser 2.68 (H) 0.44 - 1.00 mg/dL   Calcium 10.0 8.9 - 10.3 mg/dL   Total Protein 7.3 6.5 - 8.1 g/dL   Albumin 3.3 (L) 3.5 - 5.0 g/dL   AST 11 (L) 15 - 41 U/L   ALT 10 (L) 14 - 54 U/L   Alkaline Phosphatase 63 38 - 126 U/L   Total Bilirubin 0.7 0.3 - 1.2 mg/dL   GFR  calc non Af Amer 16 (L) >60 mL/min   GFR calc Af Amer 19 (L) >60 mL/min    Comment: (NOTE) The eGFR has been calculated using the CKD EPI equation. This calculation has not been validated in all clinical situations. eGFR's persistently <60 mL/min signify  possible Chronic Kidney Disease.    Anion gap 10 5 - 15  I-Stat CG4 Lactic Acid, ED  (not at [facility])     Status: None   Collection Time: 07/20/14 10:26 PM  Result Value Ref Range   Lactic Acid, Venous 1.27 0.5 - 2.0 mmol/L  Urinalysis with microscopic     Status: Abnormal   Collection Time: 07/20/14 10:28 PM  Result Value Ref Range   Color, Urine YELLOW YELLOW   APPearance CLOUDY (A) CLEAR   Specific Gravity, Urine 1.018 1.005 - 1.030   pH 6.5 5.0 - 8.0   Glucose, UA 250 (A) NEGATIVE mg/dL   Hgb urine dipstick NEGATIVE NEGATIVE   Bilirubin Urine NEGATIVE NEGATIVE   Ketones, ur NEGATIVE NEGATIVE mg/dL   Protein, ur 100 (A) NEGATIVE mg/dL   Urobilinogen, UA 0.2 0.0 - 1.0 mg/dL   Nitrite NEGATIVE NEGATIVE   Leukocytes, UA SMALL (A) NEGATIVE  Urine microscopic-add on     Status: Abnormal   Collection Time: 07/20/14 10:28 PM  Result Value Ref Range   Squamous Epithelial / LPF FEW (A) RARE   WBC, UA 3-6 <3 WBC/hpf   Bacteria, UA RARE RARE    Radiology Reports: Dg Chest 2 View  07/20/2014   CLINICAL DATA:  Intermittent fever for a few days, recurrent pneumonia, recent completion of antibiotics. History of diabetes, hypertension.  EXAM: CHEST  2 VIEW  COMPARISON:  Chest radiograph Jul 10, 2014  FINDINGS: Patchy RIGHT lower lobe airspace opacity. Decreased interstitial prominence from prior examination. No pleural effusion. Cardiomediastinal silhouette is unremarkable. LEFT lung base scarring. RIGHT PICC distal tip projects at mid superior vena cava. No pneumothorax. Soft tissue planes and included osseous structure are nonsuspicious.  IMPRESSION: RIGHT lower lobe airspace opacity concerning for recurrent pneumonia. Followup PA and lateral chest X-ray is recommended in 3-4 weeks following trial of antibiotic therapy to ensure resolution and exclude underlying malignancy.  No apparent change in position of RIGHT PICC.   Electronically Signed   By: Elon Alas M.D.   On: 07/20/2014 22:46      Problem List  Principal Problem:   Aspiration pneumonia Active Problems:   Fever   Acute encephalopathy   Acute kidney injury   Dementia   Dysphagia   Decubitus ulcer   DM type 2 (diabetes mellitus, type 2)   Assessment: This is a 74 year old African-American female who has a moderate to advanced dementia, dysphagia with recent hospitalization for suspected aspiration pneumonia who comes in with fever, cough, worsening mental status. She has an infiltrate in the right lung on the chest x-ray. She is noted to have acute renal failure with hypernatremia. She's also hyperglycemic. I believe that she has had another episode of aspiration pneumonia. She does have severe oropharyngeal dysphagia which is contributing to her presentation  Plan: #1 Recurrent aspiration pneumonia: For now, she'll be kept on broad-spectrum antibiotics with vancomycin and Zosyn. But I have discussed with the patient's daughter. I have told her that this will be a recurrent issue considering her underlying dysphagia. Patient's daughter does understand. She and her father want to keep the patient comfortable for the most part. They are agreeable to pursuing hospice. We will continue Antibiotics for  now. Palliative medicine will need to be consulted in the morning to facilitate further discussions regarding hospice and possibly disposition to a residential hospice.  #2 oropharyngeal dysphagia: Based on speech therapy notes, this is quite severe. Ideally she should be given nothing by mouth. Patient's family has decided that she will not be subjected to artificial nutrition. We will keep her nothing by mouth for now.  #3 diabetes mellitus type 2 with hyperglycemia: Will give her hydration. Place back on her long-acting insulin. Initiate sliding scale coverage.  #4 acute renal failure with hypernatremia: This is likely secondary to dehydration. Gentle IV hydration will provided. No aggressive measures.  #5 history of  dementia: Likely the main underlying reason follow the above.  #6 Decubitus ulcer: Since we are heading more towards hospice and comfort care no aggressive management for same. Local wound care will be provided.   DVT Prophylaxis: SCDs Code Status: DO NOT RESUSCITATE Family Communication: Discussed with the patient's daughter  Disposition Plan: Admit to MedSurg   Further management decisions will depend on results of further testing and patient's response to treatment.   [facility]  [facility] Pager [phone]  If 7PM-7AM, please contact night-coverage [email or web address] Password [email or web address]  07/21/2014, 12:23 AM

## 2014-07-21 NOTE — ED Provider Notes (Signed)
CSN: [id number]     Arrival date & time 07/20/14  2146 History   First MD Initiated Contact with Patient 07/20/14 2309     Chief Complaint  Patient presents with  . Fever   Level V caveat for dementia and altered mental status  (Consider location/radiation/quality/duration/timing/severity/associated sxs/prior Treatment) HPI  Patient is here with her daughter. Daughter relates patient has been in [facility] facility since April. She reports her mother has had recurrent pneumonia and was last treated 2 weeks ago. At that time she had a lot of chest congestion and coughing. She also has had UTIs and  a recent decubitus ulcer. She states that on Friday, May 27 her mother was able to feed herself and was interacting with the daughter and the patient's husband when they visited her at the nursing home. However yesterday, the following day, she was sleeping in her bed, she felt very warm and she had stopped interacting with her family. She did not express any complaints to her family and no coughing was noted. She also started having lost appetite. Daughter states they checked her temperature and and it was either 101.4 or 104. She states today the patient didn't eat at all as reported by the nursing staff. Her CBG was noted to be 407 and she was felt to have a fever. Daughter states at 6:30 PM she was extremely hot to touch. She does not know what the temperature was at that time. The daughter wanted the patient to be evaluated in the emergency department.   PCP Dr Philip Aspen  Past Medical History  Diagnosis Date  . Anxiety   . Dyspnea   . Diabetes mellitus   . Osteoarthritis   . Hypercholesteremia   . Depression   . RLS (restless legs syndrome)   . Stress   . PONV (postoperative nausea and vomiting)   . Headache(784.0)   . HTN (hypertension)     dr Johnsie Cancel   Past Surgical History  Procedure Laterality Date  . Colonoscopy    . Abdominal hysterectomy    . Lumbar  laminectomy/decompression microdiscectomy N/A 04/18/2012    Procedure: LUMBAR LAMINECTOMY/DECOMPRESSION MICRODISCECTOMY 3 LEVELS;  Surgeon: Winfield Cunas, MD;  Location: [facility];  Service: Neurosurgery;  Laterality: N/A;  Lumbar three-four, lumbar four-five, lumbar five-sacral one decompression. Synovial cyst resection lumbar four-five  . Joint replacement    . Left forearm fracture with orif Left   . Replacement total knee      right   Family History  Problem Relation Age of Onset  . Diabetes Father   . High blood pressure Father   . High blood pressure Mother    History  Substance Use Topics  . Smoking status: Never Smoker   . Smokeless tobacco: Never Used  . Alcohol Use: No   Has been in a nursing facility since July 2014 b/o dementia married  OB History    No data available     Review of Systems  Unable to perform ROS: Mental status change      Allergies  Morphine and related; Oxycodone; and Sulfonamide derivatives  Home Medications   Prior to Admission medications   Medication Sig Start Date End Date Taking? Authorizing Provider  amLODipine (NORVASC) 5 MG tablet Take 1 tablet (5 mg total) by mouth daily. 07/14/14  Yes Shanker Kristeen Mans, MD  aspirin 81 MG chewable tablet Chew 81 mg by mouth daily.   Yes Historical Provider, MD  B Complex Vitamins (B-COMPLEX/B-12 PO) Take  1 tablet by mouth daily.    Yes Historical Provider, MD  cholecalciferol (VITAMIN D) 1000 UNITS tablet Take 1,000 Units by mouth daily.   Yes Historical Provider, MD  Cranberry-Vitamin C-Probiotic (AZO CRANBERRY) 250-30 MG TABS Take 1 tablet by mouth daily.   Yes Historical Provider, MD  ergocalciferol (VITAMIN D2) 50000 UNITS capsule Take 50,000 Units by mouth once a week.   Yes Historical Provider, MD  insulin aspart (NOVOLOG) 100 UNIT/ML injection Inject 0-9 Units into the skin 3 (three) times daily with meals. CBG < 70: implement hypoglycemia protocol CBG 70 - 120: 0 units CBG 121 - 150: 1  unit CBG 151 - 200: 2 units CBG 201 - 250: 3 units CBG 251 - 300: 5 units CBG 301 - 350: 7 units CBG 351 - 400: 9 units CBG > 400: call MD 07/14/14  Yes Shanker Kristeen Mans, MD  insulin detemir (LEVEMIR) 100 UNIT/ML injection Inject 0.05 mLs (5 Units total) into the skin at bedtime. Patient taking differently: Inject 10 Units into the skin daily.  07/14/14  Yes Shanker Kristeen Mans, MD  loperamide (IMODIUM) 2 MG capsule Take 2 mg by mouth as needed for diarrhea or loose stools. Do not exceed 8 doses in 24 hours   Yes Historical Provider, MD  losartan (COZAAR) 100 MG tablet Take 100 mg by mouth daily.   Yes Historical Provider, MD  Magnesium Oxide 500 MG TABS Take 500 mg by mouth daily.    Yes Historical Provider, MD  metoprolol succinate (TOPROL-XL) 50 MG 24 hr tablet Take 1 tablet (50 mg total) by mouth daily. 07/04/14  Yes Robbie Lis, MD  mirtazapine (REMERON) 7.5 MG tablet Take 1 tablet (7.5 mg total) by mouth at bedtime. 07/04/14  Yes Robbie Lis, MD  omeprazole (PRILOSEC) 40 MG capsule Take 40 mg by mouth daily.   Yes Historical Provider, MD  polyethylene glycol (MIRALAX / GLYCOLAX) packet Take 17 g by mouth daily. 07/26/13  Yes [name], MD  potassium chloride SA (K-DUR,KLOR-CON) 20 MEQ tablet Take 40 mEq by mouth daily.    Yes Historical Provider, MD  pramipexole (MIRAPEX) 0.125 MG tablet Take 3 tablets (0.375 mg total) by mouth 3 (three) times daily. 07/04/14  Yes Robbie Lis, MD  acetaminophen (TYLENOL) 500 MG tablet Take 500 mg by mouth every 4 (four) hours as needed for mild pain, fever or headache (99.5 to 101).     Historical Provider, MD  ALPRAZolam Duanne Moron) 0.5 MG tablet Take 1 tablet (0.5 mg total) by mouth 2 (two) times daily as needed for anxiety. 07/14/14   Shanker Kristeen Mans, MD  amoxicillin-clavulanate (AUGMENTIN) 875-125 MG per tablet Take 1 tablet by mouth 2 (two) times daily. 2 more days from 07/14/14. Patient not taking: Reported on 07/20/2014 07/14/14   Jonetta Osgood,  MD  bisacodyl (DULCOLAX) 10 MG suppository Place 10 mg rectally as needed for moderate constipation.    Historical Provider, MD  collagenase (SANTYL) ointment Apply topically daily. Cleanse sacrum and coccyx with NS and pat gently dry.  APply Santyl ointment (1/8 inch thickness) (opaque) to wound beds.  Cover with NS moist gauze.  Secure with ABD pad and tape.  Change daily.  Offload heel pressure with pillow under calves.  Turn and reposition every 2 hours. Patient not taking: Reported on 07/20/2014 07/14/14   Jonetta Osgood, MD  feeding supplement, ENSURE ENLIVE, (ENSURE ENLIVE) LIQD Take 237 mLs by mouth 2 (two) times daily between meals. Patient not taking:  Reported on 07/09/2014 07/04/14   Robbie Lis, MD  loratadine (CLARITIN) 10 MG tablet Take 10 mg by mouth daily as needed for allergies.     Historical Provider, MD  ondansetron (ZOFRAN) 4 MG tablet Take 4 mg by mouth every 8 (eight) hours as needed for nausea or vomiting.     Historical Provider, MD  senna-docusate (SENOKOT-S) 8.6-50 MG per tablet Take 1 tablet by mouth at bedtime as needed for mild constipation. 07/26/13   [name], MD   BP 148/84 mmHg  Pulse 95  Temp(Src) 100.8 F (38.2 C) (Rectal)  Resp 18  Ht 5\' 7"  (1.702 m)  Wt 180 lb (81.647 kg)  BMI 28.19 kg/m2  SpO2 97%  Vital signs normal except for fever (had tylenol PTA)  Physical Exam  Constitutional: She appears well-developed and well-nourished. She appears lethargic.  Non-toxic appearance. She does not appear ill. No distress.  Lying on stretcher sleeping, does not follow commands such as open your mouth, or open your eyes, but she does open her eyes when I say her name  HENT:  Head: Normocephalic and atraumatic.  Right Ear: External ear normal.  Left Ear: External ear normal.  Nose: Nose normal. No mucosal edema or rhinorrhea.  Mouth/Throat: Oropharynx is clear and moist and mucous membranes are normal. No dental abscesses or uvula swelling.  Eyes:  Conjunctivae and EOM are normal. Pupils are equal, round, and reactive to light.  Neck: Normal range of motion and full passive range of motion without pain. Neck supple.  Cardiovascular: Normal rate, regular rhythm and normal heart sounds.  Exam reveals no gallop and no friction rub.   No murmur heard. Pulmonary/Chest: Effort normal and breath sounds normal. No respiratory distress. She has no wheezes. She has no rhonchi. She has no rales. She exhibits no tenderness and no crepitus.  Abdominal: Soft. Normal appearance and bowel sounds are normal. She exhibits no distension. There is no tenderness. There is no rebound and no guarding.  Musculoskeletal: Normal range of motion. She exhibits no edema or tenderness.  Moves all extremities well.   Neurological: She appears lethargic. No cranial nerve deficit.  Unable to cooperate for neuro exam  Skin: Skin is warm, dry and intact. No rash noted. No erythema. No pallor.     Psychiatric: She is slowed. She is noncommunicative.  Nursing note and vitals reviewed.   ED Course  Procedures (including critical care time)  Medications  vancomycin (VANCOCIN) IVPB 1000 mg/200 mL premix (not administered)  piperacillin-tazobactam (ZOSYN) IVPB 3.375 g (3.375 g Intravenous New Bag/Given 07/20/14 2353)  sodium chloride 0.9 % bolus 1,000 mL (not administered)  sodium chloride 0.9 % bolus 1,000 mL (1,000 mLs Intravenous New Bag/Given 07/20/14 2353)   Pt was given IV fluids for her dehydration. She was started on antibiotics for HCAP.    23:55 Dr Curly Rim, hospitalist, will see patient in the ED.   Labs Review Results for orders placed or performed during the hospital encounter of 07/20/14  CBC WITH DIFFERENTIAL  Result Value Ref Range   WBC 11.6 (H) 4.0 - 10.5 K/uL   RBC 3.81 (L) 3.87 - 5.11 MIL/uL   Hemoglobin 10.0 (L) 12.0 - 15.0 g/dL   HCT 32.1 (L) 36.0 - 46.0 %   MCV 84.3 78.0 - 100.0 fL   MCH 26.2 26.0 - 34.0 pg   MCHC 31.2 30.0 - 36.0 g/dL     RDW 15.0 11.5 - 15.5 %   Platelets 267 150 - 400  K/uL   Neutrophils Relative % 82 (H) 43 - 77 %   Neutro Abs 9.5 (H) 1.7 - 7.7 K/uL   Lymphocytes Relative 13 12 - 46 %   Lymphs Abs 1.5 0.7 - 4.0 K/uL   Monocytes Relative 5 3 - 12 %   Monocytes Absolute 0.5 0.1 - 1.0 K/uL   Eosinophils Relative 0 0 - 5 %   Eosinophils Absolute 0.0 0.0 - 0.7 K/uL   Basophils Relative 0 0 - 1 %   Basophils Absolute 0.0 0.0 - 0.1 K/uL  Comprehensive metabolic panel  Result Value Ref Range   Sodium 148 (H) 135 - 145 mmol/L   Potassium 4.8 3.5 - 5.1 mmol/L   Chloride 109 101 - 111 mmol/L   CO2 29 22 - 32 mmol/L   Glucose, Bld 326 (H) 65 - 99 mg/dL   BUN 38 (H) 6 - 20 mg/dL   Creatinine, Ser 2.68 (H) 0.44 - 1.00 mg/dL   Calcium 10.0 8.9 - 10.3 mg/dL   Total Protein 7.3 6.5 - 8.1 g/dL   Albumin 3.3 (L) 3.5 - 5.0 g/dL   AST 11 (L) 15 - 41 U/L   ALT 10 (L) 14 - 54 U/L   Alkaline Phosphatase 63 38 - 126 U/L   Total Bilirubin 0.7 0.3 - 1.2 mg/dL   GFR calc non Af Amer 16 (L) >60 mL/min   GFR calc Af Amer 19 (L) >60 mL/min   Anion gap 10 5 - 15  Urinalysis with microscopic  Result Value Ref Range   Color, Urine YELLOW YELLOW   APPearance CLOUDY (A) CLEAR   Specific Gravity, Urine 1.018 1.005 - 1.030   pH 6.5 5.0 - 8.0   Glucose, UA 250 (A) NEGATIVE mg/dL   Hgb urine dipstick NEGATIVE NEGATIVE   Bilirubin Urine NEGATIVE NEGATIVE   Ketones, ur NEGATIVE NEGATIVE mg/dL   Protein, ur 100 (A) NEGATIVE mg/dL   Urobilinogen, UA 0.2 0.0 - 1.0 mg/dL   Nitrite NEGATIVE NEGATIVE   Leukocytes, UA SMALL (A) NEGATIVE  Urine microscopic-add on  Result Value Ref Range   Squamous Epithelial / LPF FEW (A) RARE   WBC, UA 3-6 <3 WBC/hpf   Bacteria, UA RARE RARE  I-Stat CG4 Lactic Acid, ED  (not at [facility])  Result Value Ref Range   Lactic Acid, Venous 1.27 0.5 - 2.0 mmol/L    Laboratory interpretation all normal except hypernatremia, new elevation of BUN and creatinine consistent with dehydration, improvement of  her chronic anemia also most likely from dehydration, minor elevation of white blood cell count   Imaging Review Dg Chest 2 View  07/20/2014   CLINICAL DATA:  Intermittent fever for a few days, recurrent pneumonia, recent completion of antibiotics. History of diabetes, hypertension.  EXAM: CHEST  2 VIEW  COMPARISON:  Chest radiograph Jul 10, 2014  FINDINGS: Patchy RIGHT lower lobe airspace opacity. Decreased interstitial prominence from prior examination. No pleural effusion. Cardiomediastinal silhouette is unremarkable. LEFT lung base scarring. RIGHT PICC distal tip projects at mid superior vena cava. No pneumothorax. Soft tissue planes and included osseous structure are nonsuspicious.  IMPRESSION: RIGHT lower lobe airspace opacity concerning for recurrent pneumonia. Followup PA and lateral chest X-ray is recommended in 3-4 weeks following trial of antibiotic therapy to ensure resolution and exclude underlying malignancy.  No apparent change in position of RIGHT PICC.   Electronically Signed   By: Elon Alas M.D.   On: 07/20/2014 22:46    Dg Chest 2 View  07/09/2014   CLINICAL DATA:  Productive cough, possible aspiration 4 days ago, diabetes, hypertension  .  IMPRESSION: Improved aeration versus prior exam.   Electronically Signed   By: Lavonia Dana M.D.   On: 07/09/2014 18:04   Ct Head Wo Contrast  07/09/2014   CLINICAL DATA:  Altered mental status.  IMPRESSION: Chronic atrophic in ischemic changes without acute abnormality.   Electronically Signed   By: Inez Catalina M.D.   On: 07/09/2014 17:57   Mr Jeri Cos NU Contrast  07/11/2014   CLINICAL DATA:  Acute encephalopathy.   IMPRESSION: 1. No acute intracranial abnormality or mass. 2. Moderate cerebral atrophy and mild chronic small vessel ischemic disease.   Electronically Signed   By: Logan Bores   On: 07/11/2014 09:29   Dg Chest Port 1 View  07/10/2014   CLINICAL DATA:  Pneumonia.   IMPRESSION: 1. Right PICC line in stable position. 2.  Diffuse right lung infiltrate. Mild left lower lobe infiltrate and subsegmental atelectasis . These findings are consistent with pneumonia.   Electronically Signed   By: Fox Chase   On: 07/10/2014 15:36   Dg Chest Port 1 View  07/02/2014   CLINICAL DATA:  Atelectasis. IMPRESSION: 1. Right PICC line in stable position. 2. Cardiomegaly with mild pulmonary vascular prominence and mild interstitial prominence suggesting mild congestive heart failure. Tiny bilateral pleural effusions cannot be excluded. 3. New onset of subsegmental atelectasis both lung bases.   Electronically Signed   By: Marcello Moores  Register   On: 07/02/2014 07:39   Dg Chest Portable 1 View  06/30/2014   CLINICAL DATA:  Endotracheal tube placement. Difficulty breathing. Fever.   IMPRESSION: Appliances appear to be in satisfactory location. No evidence of active pulmonary disease.   Electronically Signed   By: Lucienne Capers M.D.   On: 06/30/2014 00:46   Dg Swallowing Func-speech Pathology  07/12/2014   Valere Dross, CCC-SLP     07/12/2014 10:32 AM  Objective Swallowing Evaluation:  (MBS)  Patient Details  Name: [name] MRN: [id number] Date of Birth: 08-21-1940  Today's Date: 07/12/2014 Time: SLP Start Time (ACUTE ONLY): 0908-SLP Stop Time (ACUTE  ONLY): 0926 SLP Time Calculation (min) (ACUTE ONLY): 18 min  Past Medical History:  Past Medical History  Diagnosis Date  . Anxiety   . Dyspnea   . Diabetes mellitus   . Osteoarthritis   . Hypercholesteremia   . Depression   . RLS (restless legs syndrome)   . Stress   . PONV (postoperative nausea and vomiting)   . Headache(784.0)   . HTN (hypertension)     dr Johnsie Cancel   Past Surgical History:  Past Surgical History  Procedure Laterality Date  . Colonoscopy    . Abdominal hysterectomy    . Lumbar laminectomy/decompression microdiscectomy N/A 04/18/2012    Procedure: LUMBAR LAMINECTOMY/DECOMPRESSION MICRODISCECTOMY 3  LEVELS;  Surgeon: Winfield Cunas, MD;  Location: [facility];   Service:  Neurosurgery;  Laterality: N/A;  Lumbar three-four,  lumbar four-five, lumbar five-sacral one decompression. Synovial  cyst resection lumbar four-five  . Joint replacement    . Left forearm fracture with orif Left   . Replacement total knee      right   HPI:  Other Pertinent Information: EZRIE BUNYAN is a 74 y.o.  female who had a recent hospitalization complicated by severe  sepsis due to UTI resulting in encephalopathy and acute renal  failure as well as acute respiratory failure requiring  intubation.  she was discharged to nursing facility on May 13  Daughter states that while in at the nursing facility, patient   had a  swallow evaluation showed evidence of significant  aspiration. Family states that they were told that all the food  that is swallowed goes directly into the lungs. At nursing home  facility they made nothing by mouth. Per family she was not  having history of coughing and shortness of breath with fever  since her discharge. Her GI doctor has been called and  recommended transfer to emergency department. ER physician spoke  to Dr. Benson Norway who is on-call who states that GI will consult and  patient may need endoscopy to clarify significant dysphagia. MBS  recommeded following BSE.  No Data Recorded  Assessment / Plan / Recommendation CHL IP CLINICAL IMPRESSIONS 07/12/2014  Therapy Diagnosis (None)  Clinical Impression Mild oral dysphagia with delayed transit and  moderate-severe sensorimotor pharyngeal dysphagia. Decreased  sensation resulted in valleculae initation site of swallow and  maximum vallecular and pyriform sinus residue. Pt unable to  produce volitional swallows provided max tactile and verbal  assist with demonstration and dry spoon trials. Silent aspiration  of honey thick present and risk of aspiration from residue is  high. Recommendation based on results are NPO however discussed  with MD and family desires to accept risk for comfort feeds. SLP  will follow up briefly for educatio  re: safest swallow  precautions (upright position, no straws, 2 swallows using dry  spoon, sit up one hour after meals).       CHL IP TREATMENT RECOMMENDATION 07/12/2014  Treatment Recommendations Therapy as outlined in treatment plan  below     CHL IP DIET RECOMMENDATION 07/12/2014  SLP Diet Recommendations Dysphagia 1 (Puree)  Liquid Administration via (None)  Medication Administration Crushed with puree  Compensations Slow rate;Small sips/bites;Multiple dry swallows  after each bite/sip;Clear throat intermittently  Postural Changes and/or Swallow Maneuvers (None)     CHL IP OTHER RECOMMENDATIONS 07/12/2014  Recommended Consults (None)  Oral Care Recommendations Oral care BID Other Recommendations  (None)     CHL IP FOLLOW UP RECOMMENDATIONS 07/11/2014  Follow up Recommendations Skilled Nursing facility     [facility] IP FREQUENCY AND DURATION 07/12/2014  Speech Therapy Frequency (ACUTE ONLY) min 2x/week  Treatment Duration 2 weeks     Pertinent Vitals/Pain none    SLP Swallow Goals No flowsheet data found.  No flowsheet data found.    CHL IP REASON FOR REFERRAL 07/12/2014  Reason for Referral Objectively evaluate swallowing function     CHL IP ORAL PHASE 07/12/2014  Lips (None)  Tongue (None)  Mucous membranes (None)  Nutritional status (None)  Other (None)  Oxygen therapy (None)  Oral Phase Impaired  Oral - Pudding Teaspoon (None)  Oral - Pudding Cup (None)  Oral - Honey Teaspoon (None)  Oral - Honey Cup (None)  Oral - Honey Syringe (None)  Oral - Nectar Teaspoon (None)  Oral - Nectar Cup (None)  Oral - Nectar Straw (None)  Oral - Nectar Syringe (None)  Oral - Ice Chips (None)  Oral - Thin Teaspoon (None)  Oral - Thin Cup (None)  Oral - Thin Straw (None)  Oral - Thin Syringe (None)  Oral - Puree (None)  Oral - Mechanical Soft (None)  Oral - Regular (None)  Oral - Multi-consistency (None)  Oral - Pill (None)  Oral Phase - Comment (None)      CHL IP PHARYNGEAL PHASE 07/12/2014  Pharyngeal Phase Impaired  Pharyngeal - Pudding  Teaspoon (None)  Penetration/Aspiration details (pudding teaspoon) (None)  Pharyngeal - Pudding Cup (None)  Penetration/Aspiration details (pudding cup) (None)  Pharyngeal - Honey Teaspoon (None)  Penetration/Aspiration details (honey teaspoon) (None)  Pharyngeal - Honey Cup (None)  Penetration/Aspiration details (honey cup) (None)  Pharyngeal - Honey Syringe (None)  Penetration/Aspiration details (honey syringe) (None)  Pharyngeal - Nectar Teaspoon (None)  Penetration/Aspiration details (nectar teaspoon) (None)  Pharyngeal - Nectar Cup (None)  Penetration/Aspiration details (nectar cup) (None)  Pharyngeal - Nectar Straw (None)  Penetration/Aspiration details (nectar straw) (None)  Pharyngeal - Nectar Syringe (None)  Penetration/Aspiration details (nectar syringe) (None)  Pharyngeal - Ice Chips (None)  Penetration/Aspiration details (ice chips) (None)  Pharyngeal - Thin Teaspoon (None)  Penetration/Aspiration details (thin teaspoon) (None)  Pharyngeal - Thin Cup (None)  Penetration/Aspiration details (thin cup) (None)  Pharyngeal - Thin Straw (None)  Penetration/Aspiration details (thin straw) (None)  Pharyngeal - Thin Syringe (None)  Penetration/Aspiration details (thin syringe') (None)  Pharyngeal - Puree (None)  Penetration/Aspiration details (puree) (None)  Pharyngeal - Mechanical Soft (None)  Penetration/Aspiration details (mechanical soft) (None)  Pharyngeal - Regular (None)  Penetration/Aspiration details (regular) (None)  Pharyngeal - Multi-consistency (None)  Penetration/Aspiration details (multi-consistency) (None)  Pharyngeal - Pill (None)  Penetration/Aspiration details (pill) (None) Pharyngeal Comment  (None)      CHL IP CERVICAL ESOPHAGEAL PHASE 07/12/2014  Cervical Esophageal Phase WFL  Pudding Teaspoon (None)  Pudding Cup (None)  Honey Teaspoon (None)  Honey Cup (None)  Honey Straw (None)  Nectar Teaspoon (None)  Nectar Cup (None)  Nectar Straw (None)  Nectar Sippy Cup (None)  Thin Teaspoon (None)   Thin Cup (None)  Thin Straw (None)  Thin Sippy Cup (None)  Cervical Esophageal Comment (None)    No flowsheet data found.         Houston Siren 07/12/2014, 10:31 AM  Orbie Pyo Colvin Caroli.Ed CCC-SLP Pager [phone]           EKG Interpretation None      MDM   Final diagnoses:  Fever  Healthcare-associated pneumonia  Hypernatremia  Acute renal insufficiency  Dehydration  Altered mental status, unspecified altered mental status type    Plan admission  Rolland Porter, MD, Barbette Or, MD 07/21/14 [phone]

## 2014-07-21 NOTE — Consult Note (Signed)
Consultation Note Date: 07/21/2014   Patient Name: [name]  DOB: 1940/10/06  MRN: [id number]  Age / Sex: 74 y.o., female   PCP: [name], MD Referring Physician: Robbie Lis, MD  Reason for Consultation: Establishing goals of care and Psychosocial/spiritual support, non pain symptom management   Palliative Care Assessment and Plan Summary of Established Goals of Care and Medical Treatment Preferences    Palliative Care Discussion Held Today:  Consult is for review of medical treatment options, clarification of goals of care and end of life issues, disposition and options, and symptom recommendation.  This NP Wadie Lessen reviewed medical records, received report from team, assessed the patient and then meet at the patient's bedside along with her husband and daughter  to discuss diagnosis prognosis, GOC, EOL wishes disposition and options.   A detailed discussion was had today regarding advanced directives.  Concepts specific to code status, artifical feeding and hydration, continued IV antibiotics and rehospitalization was had.  The difference between a aggressive medical intervention path  and a palliative comfort care path for this patient at this time was had.  Values and goals of care important to patient and family were attempted to be elicited.  Concept of Hospice and Palliative Care were discussed  Natural trajectory and expectations at EOL were discussed.  Questions and concerns addressed.  Hard Choices booklet left for review. Family encouraged to call with questions or concerns.  PMT will continue to support holistically.  Detailed discussion regarding diagnosis of dementia and its natural trajectroy  Contacts/Participants in Discussion: Husband and daughter  Primary Decision Maker: husband and daughter Freada Bergeron  HCPOA: yes/daughter   Code Status/Advance Care Planning:  DNR/DNI  Focus is comfort and dignity.  Family struggles with any  de-escalation of care however today they make decision for no artifical feeding now or in the future and agree to comfort feeds with known risk of aspiration.        -diet pureed with honey thick liquids as previously recommended by speech -     Continue to treat the treatable, with utilization of IV antibiotics as indicated, family is hopeful for improvement and return to baseline.   Symptom Management:   Dysphagia: decision is made for no artificial feeding now or in the future, comfort feeds as tolerated with known risk of aspiration   Psycho-social/Spiritual:   Support System:  Husband and daughter  Desire for further Chaplaincy support:no  Prognosis:  Dependant on outcomes and choices for life prolonging measures.  MOST form introduced  Discharge Planning:  Pending, PMT will continue to support and help navigate decisions.       Chief Complaint/History of Present Illness: weakness,   74 y.o. female with past medical history of Anxiety; Dyspnea; Diabetes mellitus; Osteoarthritis; Hypercholesteremia; Depression; RLS (restless legs syndrome); ; Headache(784.0); and HTN (hypertension).   This is a 74 year old female who was recently hospitalized for severe sepsis as a result of an UTI. She also developed encephalopathy, ARF, and respiratory failure with resultant intubation. At [facility] she was having issues with aspiration of her PO intake.   Continued physical, functional and cognitive decline.  Frequent hospitalizations, family faced with advanced directive decisions and anticipatory care needs.   Primary Diagnoses  Present on Admission:  . Fever . Aspiration pneumonia . Dementia . Acute kidney injury . Acute encephalopathy . Decubitus ulcer  Palliative Review of Systems: - unable to illicit due to decreased cognition I have reviewed the medical record, interviewed the  patient and family, and examined the patient. The following aspects are pertinent.  Past  Medical History  Diagnosis Date  . Anxiety   . Dyspnea   . Diabetes mellitus   . Osteoarthritis   . Hypercholesteremia   . Depression   . RLS (restless legs syndrome)   . Stress   . PONV (postoperative nausea and vomiting)   . Headache(784.0)   . HTN (hypertension)     dr Johnsie Cancel   History   Social History  . Marital Status: Married    Spouse Name: Iona Beard  . Number of Children: 1  . Years of Education: college   Occupational History  .      retired   Social History Main Topics  . Smoking status: Never Smoker   . Smokeless tobacco: Never Used  . Alcohol Use: No  . Drug Use: No  . Sexual Activity: Not Currently   Other Topics Concern  . None   Social History Narrative   Patient lives at home with her husband Iona Beard).   Education- college    Right handed.   Caffeine- soda half cup daily.   Retired.   Family History  Problem Relation Age of Onset  . Diabetes Father   . High blood pressure Father   . High blood pressure Mother    Scheduled Meds: . insulin aspart  0-15 Units Subcutaneous 6 times per day  . insulin detemir  5 Units Subcutaneous QHS  . piperacillin-tazobactam (ZOSYN)  IV  3.375 g Intravenous 3 times per day  . vancomycin  1,000 mg Intravenous Q24H   Continuous Infusions: . sodium chloride 75 mL/hr at 07/21/14 0150   PRN Meds:.acetaminophen **OR** acetaminophen, hydrALAZINE, ondansetron **OR** ondansetron (ZOFRAN) IV, sodium chloride Medications Prior to Admission:  Prior to Admission medications   Medication Sig Start Date End Date Taking? Authorizing Provider  amLODipine (NORVASC) 5 MG tablet Take 1 tablet (5 mg total) by mouth daily. 07/14/14  Yes Shanker Kristeen Mans, MD  aspirin 81 MG chewable tablet Chew 81 mg by mouth daily.   Yes Historical Provider, MD  B Complex Vitamins (B-COMPLEX/B-12 PO) Take 1 tablet by mouth daily.    Yes Historical Provider, MD  cholecalciferol (VITAMIN D) 1000 UNITS tablet Take 1,000 Units by mouth daily.   Yes  Historical Provider, MD  Cranberry-Vitamin C-Probiotic (AZO CRANBERRY) 250-30 MG TABS Take 1 tablet by mouth daily.   Yes Historical Provider, MD  ergocalciferol (VITAMIN D2) 50000 UNITS capsule Take 50,000 Units by mouth once a week.   Yes Historical Provider, MD  insulin aspart (NOVOLOG) 100 UNIT/ML injection Inject 0-9 Units into the skin 3 (three) times daily with meals. CBG < 70: implement hypoglycemia protocol CBG 70 - 120: 0 units CBG 121 - 150: 1 unit CBG 151 - 200: 2 units CBG 201 - 250: 3 units CBG 251 - 300: 5 units CBG 301 - 350: 7 units CBG 351 - 400: 9 units CBG > 400: call MD 07/14/14  Yes Shanker Kristeen Mans, MD  insulin detemir (LEVEMIR) 100 UNIT/ML injection Inject 0.05 mLs (5 Units total) into the skin at bedtime. Patient taking differently: Inject 10 Units into the skin daily.  07/14/14  Yes Shanker Kristeen Mans, MD  loperamide (IMODIUM) 2 MG capsule Take 2 mg by mouth as needed for diarrhea or loose stools. Do not exceed 8 doses in 24 hours   Yes Historical Provider, MD  losartan (COZAAR) 100 MG tablet Take 100 mg by mouth daily.   Yes  Historical Provider, MD  Magnesium Oxide 500 MG TABS Take 500 mg by mouth daily.    Yes Historical Provider, MD  metoprolol succinate (TOPROL-XL) 50 MG 24 hr tablet Take 1 tablet (50 mg total) by mouth daily. 07/04/14  Yes Robbie Lis, MD  mirtazapine (REMERON) 7.5 MG tablet Take 1 tablet (7.5 mg total) by mouth at bedtime. 07/04/14  Yes Robbie Lis, MD  omeprazole (PRILOSEC) 40 MG capsule Take 40 mg by mouth daily.   Yes Historical Provider, MD  polyethylene glycol (MIRALAX / GLYCOLAX) packet Take 17 g by mouth daily. 07/26/13  Yes [name], MD  potassium chloride SA (K-DUR,KLOR-CON) 20 MEQ tablet Take 40 mEq by mouth daily.    Yes Historical Provider, MD  pramipexole (MIRAPEX) 0.125 MG tablet Take 3 tablets (0.375 mg total) by mouth 3 (three) times daily. 07/04/14  Yes Robbie Lis, MD  acetaminophen (TYLENOL) 500 MG tablet Take 500 mg by  mouth every 4 (four) hours as needed for mild pain, fever or headache (99.5 to 101).     Historical Provider, MD  ALPRAZolam Duanne Moron) 0.5 MG tablet Take 1 tablet (0.5 mg total) by mouth 2 (two) times daily as needed for anxiety. 07/14/14   Shanker Kristeen Mans, MD  amoxicillin-clavulanate (AUGMENTIN) 875-125 MG per tablet Take 1 tablet by mouth 2 (two) times daily. 2 more days from 07/14/14. Patient not taking: Reported on 07/20/2014 07/14/14   Jonetta Osgood, MD  bisacodyl (DULCOLAX) 10 MG suppository Place 10 mg rectally as needed for moderate constipation.    Historical Provider, MD  collagenase (SANTYL) ointment Apply topically daily. Cleanse sacrum and coccyx with NS and pat gently dry.  APply Santyl ointment (1/8 inch thickness) (opaque) to wound beds.  Cover with NS moist gauze.  Secure with ABD pad and tape.  Change daily.  Offload heel pressure with pillow under calves.  Turn and reposition every 2 hours. Patient not taking: Reported on 07/20/2014 07/14/14   Jonetta Osgood, MD  feeding supplement, ENSURE ENLIVE, (ENSURE ENLIVE) LIQD Take 237 mLs by mouth 2 (two) times daily between meals. Patient not taking: Reported on 07/09/2014 07/04/14   Robbie Lis, MD  loratadine (CLARITIN) 10 MG tablet Take 10 mg by mouth daily as needed for allergies.     Historical Provider, MD  ondansetron (ZOFRAN) 4 MG tablet Take 4 mg by mouth every 8 (eight) hours as needed for nausea or vomiting.     Historical Provider, MD  senna-docusate (SENOKOT-S) 8.6-50 MG per tablet Take 1 tablet by mouth at bedtime as needed for mild constipation. 07/26/13   [name], MD   Allergies  Allergen Reactions  . Morphine And Related Other (See Comments)    hallucination   . Oxycodone Other (See Comments)    hallucination   . Sulfonamide Derivatives Swelling   CBC:    Component Value Date/Time   WBC 7.7 07/21/2014 0811   HGB 9.0* 07/21/2014 0811   HCT 29.6* 07/21/2014 0811   PLT 221 07/21/2014 0811   MCV 86.0  07/21/2014 0811   NEUTROABS 9.5* 07/20/2014 2222   LYMPHSABS 1.5 07/20/2014 2222   MONOABS 0.5 07/20/2014 2222   EOSABS 0.0 07/20/2014 2222   BASOSABS 0.0 07/20/2014 2222   Comprehensive Metabolic Panel:    Component Value Date/Time   NA 149* 07/21/2014 0811   K 3.7 07/21/2014 0811   CL 114* 07/21/2014 0811   CO2 28 07/21/2014 0811   BUN 37* 07/21/2014 0811   CREATININE  2.38* 07/21/2014 0811   GLUCOSE 230* 07/21/2014 0811   CALCIUM 9.3 07/21/2014 0811   AST 8* 07/21/2014 0811   ALT 8* 07/21/2014 0811   ALKPHOS 51 07/21/2014 0811   BILITOT 0.6 07/21/2014 0811   PROT 6.6 07/21/2014 0811   ALBUMIN 3.1* 07/21/2014 0811    Physical Exam: Vital Signs: BP 132/81 mmHg  Pulse 75  Temp(Src) 98 F (36.7 C) (Oral)  Resp 18  Ht 5\' 7"  (1.702 m)  Wt 81.647 kg (180 lb)  BMI 28.19 kg/m2  SpO2 99% SpO2: SpO2: 99 % O2 Device: O2 Device: Not Delivered O2 Flow Rate:   Intake/output summary:  Intake/Output Summary (Last 24 hours) at 07/21/14 1453 Last data filed at 07/21/14 0028  Gross per 24 hour  Intake   1000 ml  Output      0 ml  Net   1000 ml   LBM:   Baseline Weight: Weight: 81.647 kg (180 lb) Most recent weight: Weight: 81.647 kg (180 lb)  Exam Findings:  General: chronically ill appearing, NAD HEENT: dry buccal membranes, no exudate CVS: RRR RESP: Decreased in bases          Palliative Performance Scale: 30 % at best             Additional Data Reviewed: Recent Labs     07/20/14  2222  07/21/14  0811  WBC  11.6*  7.7  HGB  10.0*  9.0*  PLT  267  221  NA  148*  149*  BUN  38*  37*  CREATININE  2.68*  2.38*     Time In: 1500 Time Out: 1620 Time Total: 80 min  Greater than 50%  of this time was spent counseling and coordinating care related to the above assessment and plan.  Signed by: Wadie Lessen, NP  Knox Royalty, NP  07/21/2014, 2:53 PM  Please contact Palliative Medicine Team phone at [phone] for questions and concerns.   Paged  Dr Charlies Silvers

## 2014-07-21 NOTE — Progress Notes (Signed)
Addendum to admission note done 07/21/2014   Pt seen and examined at the bedside.  74 year old African-American female with past medical history of advanced dementia, dysphagia with recent hospitalization for suspected aspiration pneumonia who presented with fevers, cough and worsening mental status changes, confusion. CXR showed right lung infiltrate. Started on broad spectrum abx. Palliative consulted for goals of care.   Assessment and Plan: Recurrent aspiration pneumonia: - Continue current broad-spectrum antibiotics, vancomycin and Zosyn.  - TR[facility] spoke with the family about goals of care, family agreeable ot hospice - PCT consulted    [name] [facility] [phone]

## 2014-07-22 DIAGNOSIS — E860 Dehydration: Secondary | ICD-10-CM | POA: Diagnosis not present

## 2014-07-22 DIAGNOSIS — R41841 Cognitive communication deficit: Secondary | ICD-10-CM | POA: Diagnosis not present

## 2014-07-22 DIAGNOSIS — R1310 Dysphagia, unspecified: Secondary | ICD-10-CM | POA: Diagnosis not present

## 2014-07-22 DIAGNOSIS — R339 Retention of urine, unspecified: Secondary | ICD-10-CM | POA: Diagnosis not present

## 2014-07-22 DIAGNOSIS — D72829 Elevated white blood cell count, unspecified: Secondary | ICD-10-CM | POA: Insufficient documentation

## 2014-07-22 DIAGNOSIS — E1165 Type 2 diabetes mellitus with hyperglycemia: Secondary | ICD-10-CM | POA: Diagnosis not present

## 2014-07-22 DIAGNOSIS — R278 Other lack of coordination: Secondary | ICD-10-CM | POA: Diagnosis not present

## 2014-07-22 DIAGNOSIS — M6281 Muscle weakness (generalized): Secondary | ICD-10-CM | POA: Diagnosis not present

## 2014-07-22 DIAGNOSIS — M79641 Pain in right hand: Secondary | ICD-10-CM | POA: Diagnosis not present

## 2014-07-22 DIAGNOSIS — J690 Pneumonitis due to inhalation of food and vomit: Secondary | ICD-10-CM | POA: Diagnosis not present

## 2014-07-22 DIAGNOSIS — R6520 Severe sepsis without septic shock: Secondary | ICD-10-CM | POA: Diagnosis not present

## 2014-07-22 DIAGNOSIS — J9589 Other postprocedural complications and disorders of respiratory system, not elsewhere classified: Secondary | ICD-10-CM | POA: Diagnosis not present

## 2014-07-22 DIAGNOSIS — G9340 Encephalopathy, unspecified: Secondary | ICD-10-CM | POA: Diagnosis not present

## 2014-07-22 DIAGNOSIS — I10 Essential (primary) hypertension: Secondary | ICD-10-CM | POA: Diagnosis not present

## 2014-07-22 DIAGNOSIS — K3184 Gastroparesis: Secondary | ICD-10-CM | POA: Diagnosis not present

## 2014-07-22 DIAGNOSIS — N390 Urinary tract infection, site not specified: Secondary | ICD-10-CM | POA: Diagnosis not present

## 2014-07-22 DIAGNOSIS — F0390 Unspecified dementia without behavioral disturbance: Secondary | ICD-10-CM | POA: Diagnosis not present

## 2014-07-22 DIAGNOSIS — J302 Other seasonal allergic rhinitis: Secondary | ICD-10-CM | POA: Diagnosis not present

## 2014-07-22 DIAGNOSIS — E559 Vitamin D deficiency, unspecified: Secondary | ICD-10-CM | POA: Diagnosis not present

## 2014-07-22 DIAGNOSIS — E119 Type 2 diabetes mellitus without complications: Secondary | ICD-10-CM | POA: Diagnosis not present

## 2014-07-22 DIAGNOSIS — A419 Sepsis, unspecified organism: Secondary | ICD-10-CM | POA: Diagnosis not present

## 2014-07-22 DIAGNOSIS — M25531 Pain in right wrist: Secondary | ICD-10-CM | POA: Diagnosis not present

## 2014-07-22 DIAGNOSIS — F418 Other specified anxiety disorders: Secondary | ICD-10-CM | POA: Diagnosis not present

## 2014-07-22 DIAGNOSIS — J9601 Acute respiratory failure with hypoxia: Secondary | ICD-10-CM | POA: Diagnosis not present

## 2014-07-22 DIAGNOSIS — R1312 Dysphagia, oropharyngeal phase: Secondary | ICD-10-CM | POA: Diagnosis not present

## 2014-07-22 LAB — URINE CULTURE
COLONY COUNT: NO GROWTH
CULTURE: NO GROWTH
Special Requests: NORMAL

## 2014-07-22 LAB — BASIC METABOLIC PANEL
Anion gap: 11 (ref 5–15)
BUN: 32 mg/dL — AB (ref 6–20)
CHLORIDE: 110 mmol/L (ref 101–111)
CO2: 28 mmol/L (ref 22–32)
Calcium: 9.6 mg/dL (ref 8.9–10.3)
Creatinine, Ser: 2.09 mg/dL — ABNORMAL HIGH (ref 0.44–1.00)
GFR calc Af Amer: 26 mL/min — ABNORMAL LOW (ref >60)
GFR, EST NON AFRICAN AMERICAN: 22 mL/min — AB (ref >60)
Glucose, Bld: 202 mg/dL — ABNORMAL HIGH (ref 65–99)
Potassium: 3.1 mmol/L — ABNORMAL LOW (ref 3.5–5.1)
Sodium: 149 mmol/L — ABNORMAL HIGH (ref 135–145)

## 2014-07-22 LAB — GLUCOSE, CAPILLARY
GLUCOSE-CAPILLARY: 296 mg/dL — AB (ref 65–99)
GLUCOSE-CAPILLARY: 156 mg/dL — AB (ref 65–99)
GLUCOSE-CAPILLARY: 167 mg/dL — AB (ref 65–99)
GLUCOSE-CAPILLARY: 259 mg/dL — AB (ref 65–99)

## 2014-07-22 MED ORDER — MIRTAZAPINE 7.5 MG PO TABS: 7.5000 mg | ORAL_TABLET | Freq: Every day | ORAL | Status: DC

## 2014-07-22 MED ORDER — RESOURCE THICKENUP CLEAR PO POWD
ORAL | Status: DC | PRN
  Filled 2014-07-22: qty 125

## 2014-07-22 MED ORDER — RESOURCE THICKENUP CLEAR PO POWD: 1.0000 | ORAL | Status: DC | PRN

## 2014-07-22 MED ORDER — ALPRAZOLAM 0.5 MG PO TABS: 0.5000 mg | ORAL_TABLET | Freq: Two times a day (BID) | ORAL | Status: DC | PRN

## 2014-07-22 MED ORDER — POTASSIUM CHLORIDE CRYS ER 20 MEQ PO TBCR: 20.0000 meq | EXTENDED_RELEASE_TABLET | Freq: Every day | ORAL | Status: DC

## 2014-07-22 MED ORDER — LEVOFLOXACIN 25 MG/ML PO SOLN: 500.0000 mg | Freq: Every day | ORAL | Status: DC

## 2014-07-22 NOTE — Progress Notes (Signed)
Gave report to Jolayne Panther at [facility]. Left message if he had additional questions.

## 2014-07-22 NOTE — Discharge Summary (Addendum)
Physician Discharge Summary  [name] VQX:[id number] DOB: 01-Oct-1940 DOA: 07/20/2014  PCP: [name], MD  Admit date: 07/20/2014 Discharge date: 07/22/2014  Recommendations for Outpatient Follow-up:  1. Take Levaquin for 5 days on discharge for treatment of pneumonia 2. Palliative care services to continue to follow the patient in skilled nursing facility, address goals of care 3. Please repeat chest x-ray in about 1-2 weeks to follow on resolution of pneumonia.  Discharge Diagnoses:  Principal Problem:   Aspiration pneumonia Active Problems:   Fever   Acute encephalopathy   Acute kidney injury   Dementia   Dysphagia   Decubitus ulcer   DM type 2 (diabetes mellitus, type 2)   Palliative care encounter    Discharge Condition: stable   Diet recommendation: as tolerated dysphagia 1   History of present illness:  74 y.o. female with a past medical history of dementia, diabetes, hypertension, recent hospitalization for aspiration pneumonia, sepsis. Patient presented with reports of fever and cough as well as worsening mental status changes as reported by patient's family.  On this admission, patient was found to have right lower lobe airspace opacity concerning for recurrent pneumonia. Patient was started on broad-spectrum antibodies, vancomycin and Zosyn at the time of the admission. Palliative was consulted for goals of care.   Hospital Course:   Principal problem: Recurrent aspiration pneumonia / healthcare associated pneumonia / leukocytosis - Chest x-ray on the admission showed right lower lobe airspace opacity concerning for recurrent pneumonia - Patient was started on broad-spectrum antibodies, vancomycin and Zosyn  - Blood cultures on this admission showed no growth. - Patient will continue taking Levaquin on discharge for 5 days which would complete a total of 7 days treatment for pneumonia.  Active problems: Acute encephalopathy / Dementia - Likely  combination of dementia and recurrent pneumonia - Stable  Oropharyngeal dysphagia - Likely worsening because of dementia. - Patient at risk for aspiration because of oropharyngeal dysphasia - Dysphagia 1 diet  Diabetes mellitus type 2 with renal manifestations - Continue insulin regimen per prior home regimen.  - Last A1c on 07/10/2014 was 9.1 indicating less than goal glycemic control.  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 - Palliative to continue to follow while pt in SNF. Continue addressing goals of care. Family agreeable to hospice as well  - Local wound care, pink foam  Code status: DNR/DNI  Signed:  [name], MD  [facility] 07/22/2014, 10:54 AM  Pager #: [phone]  Time spent in minutes: more than 30 minutes    Discharge Exam: Filed Vitals:   07/22/14 0654  BP: 157/86  Pulse: 84  Temp: 98.6 F (37 C)  Resp: 20   Filed Vitals:   07/21/14 0555 07/21/14 1246 07/21/14 2122 07/22/14 0654  BP: 153/87 132/81 149/76 157/86  Pulse: 66 75 72 84  Temp: 97.4 F (36.3 C) 98 F (36.7 C) 98.3 F (36.8 C)  98.6 F (37 C)  TempSrc: Axillary Oral Oral Oral  Resp: 17 18 16 20   Height:      Weight:      SpO2: 98% 99% 98% 98%    General: Pt is alert, not in acute distress Cardiovascular: Regular rate and rhythm, S1/S2 (+)  Respiratory: no wheezing, no crackles, no rhonchi Abdominal: Soft, non tender, non  distended, bowel sounds +, no guarding Extremities: no cyanosis, pulses palpable bilaterally DP and PT Neuro: Grossly nonfocal  Discharge Instructions  Discharge Instructions    Call MD for:  difficulty breathing, headache or visual disturbances    Complete by:  As directed      Call MD for:  persistant nausea and vomiting    Complete by:  As directed      Call MD for:  severe uncontrolled pain    Complete by:  As directed      Diet - low sodium heart healthy    Complete by:  As directed      Discharge instructions    Complete by:  As directed   1. Take Levaquin for 5 days on discharge for treatment of pneumonia 2. Palliative care services to continue to follow the patient in skilled nursing facility, address goals of care     Increase activity slowly    Complete by:  As directed             Medication List    STOP taking these medications        amoxicillin-clavulanate 875-125 MG per tablet  Commonly known as:  AUGMENTIN     collagenase ointment  Commonly known as:  SANTYL      TAKE these medications        acetaminophen 500 MG tablet  Commonly known as:  TYLENOL  Take 500 mg by mouth every 4 (four) hours as needed for mild pain, fever or headache (99.5 to 101).     ALPRAZolam 0.5 MG tablet  Commonly known as:  XANAX  Take 1 tablet (0.5 mg total) by mouth 2 (two) times daily as needed for anxiety.     amLODipine 5 MG tablet  Commonly known as:  NORVASC  Take 1 tablet (5 mg total) by mouth daily.     aspirin 81 MG chewable tablet  Chew 81 mg by mouth daily.     AZO CRANBERRY 250-30 MG Tabs  Take 1 tablet by mouth daily.     B-COMPLEX/B-12 PO  Take 1 tablet by mouth daily.     bisacodyl 10 MG suppository  Commonly known as:  DULCOLAX  Place 10 mg rectally as needed for moderate constipation.     cholecalciferol 1000 UNITS tablet  Commonly known as:  VITAMIN D  Take 1,000 Units by mouth daily.     ergocalciferol 50000 UNITS capsule  Commonly known as:   VITAMIN D2  Take 50,000 Units by mouth once a week.     feeding supplement (ENSURE ENLIVE) Liqd  Take 237 mLs by mouth 2 (two) times daily between meals.     insulin aspart 100 UNIT/ML injection  Commonly known as:  novoLOG  - Inject 0-9 Units into the skin 3 (three) times daily with meals. CBG < 70: implement hypoglycemia protocol  - CBG 70 - 120: 0 units  - CBG 121 - 150: 1 unit  - CBG 151 - 200: 2 units  - CBG 201 - 250: 3 units  - CBG 251 - 300: 5 units  - CBG  301 - 350: 7 units  - CBG 351 - 400: 9 units  - CBG > 400: call MD     insulin detemir 100 UNIT/ML injection  Commonly known as:  LEVEMIR  Inject 0.05 mLs (5 Units total) into the skin at bedtime.     levofloxacin 25 MG/ML solution  Commonly known as:  LEVAQUIN  Take 20 mLs (500 mg total) by mouth daily.     loperamide 2 MG capsule  Commonly known as:  IMODIUM  Take 2 mg by mouth as needed for diarrhea or loose stools. Do not exceed 8 doses in 24 hours     loratadine 10 MG tablet  Commonly known as:  CLARITIN  Take 10 mg by mouth daily as needed for allergies.     losartan 100 MG tablet  Commonly known as:  COZAAR  Take 100 mg by mouth daily.     Magnesium Oxide 500 MG Tabs  Take 500 mg by mouth daily.     metoprolol succinate 50 MG 24 hr tablet  Commonly known as:  TOPROL-XL  Take 1 tablet (50 mg total) by mouth daily.     mirtazapine 7.5 MG tablet  Commonly known as:  REMERON  Take 1 tablet (7.5 mg total) by mouth at bedtime.     omeprazole 40 MG capsule  Commonly known as:  PRILOSEC  Take 40 mg by mouth daily.     ondansetron 4 MG tablet  Commonly known as:  ZOFRAN  Take 4 mg by mouth every 8 (eight) hours as needed for nausea or vomiting.     polyethylene glycol packet  Commonly known as:  MIRALAX / GLYCOLAX  Take 17 g by mouth daily.     potassium chloride SA 20 MEQ tablet  Commonly known as:  K-DUR,KLOR-CON  Take 1 tablet (20 mEq total) by mouth daily.     pramipexole 0.125 MG  tablet  Commonly known as:  MIRAPEX  Take 3 tablets (0.375 mg total) by mouth 3 (three) times daily.     RESOURCE THICKENUP CLEAR Powd  Take 120 g by mouth as needed.     senna-docusate 8.6-50 MG per tablet  Commonly known as:  Senokot-S  Take 1 tablet by mouth at bedtime as needed for mild constipation.           Follow-up Information    Follow up with [name], MD. Schedule an appointment as soon as possible for a visit in 1 week.   Specialty:  Internal Medicine   Why:  Follow up appt after recent hospitalization   Contact information:   [place] [address] [phone]        The results of significant diagnostics from this hospitalization (including imaging, microbiology, ancillary and laboratory) are listed below for reference.    Significant Diagnostic Studies: Dg Chest 2 View  07/20/2014   CLINICAL DATA:  Intermittent fever for a few days, recurrent pneumonia, recent completion of antibiotics. History of diabetes, hypertension.  EXAM: CHEST  2 VIEW  COMPARISON:  Chest radiograph Jul 10, 2014  FINDINGS: Patchy RIGHT lower lobe airspace opacity. Decreased interstitial prominence from prior examination. No pleural effusion. Cardiomediastinal silhouette is unremarkable. LEFT lung base scarring. RIGHT PICC distal tip projects at mid superior vena cava. No pneumothorax. Soft tissue planes and included osseous structure are nonsuspicious.  IMPRESSION: RIGHT lower lobe airspace opacity concerning for recurrent pneumonia. Followup PA and lateral chest X-ray is recommended in 3-4 weeks following trial of antibiotic therapy  to ensure resolution and exclude underlying malignancy.  No apparent change in position of RIGHT PICC.   Electronically Signed   By: Elon Alas M.D.   On: 07/20/2014 22:46   Dg Chest 2 View  07/09/2014   CLINICAL DATA:  Productive cough, possible aspiration 4 days ago, diabetes, hypertension  EXAM: CHEST  2 VIEW  COMPARISON:   07/02/2014  FINDINGS: RIGHT arm PICC line tip projects over SVC.  Normal heart size, mediastinal contours and pulmonary vascularity.  Atherosclerotic calcification aorta.  Minimal RIGHT basilar atelectasis.  Lungs otherwise clear.  Improved aeration versus previous study.  No pleural effusion or pneumothorax.  IMPRESSION: Improved aeration versus prior exam.   Electronically Signed   By: Lavonia Dana M.D.   On: 07/09/2014 18:04   Ct Head Wo Contrast  07/09/2014   CLINICAL DATA:  Altered mental status  EXAM: CT HEAD WITHOUT CONTRAST  TECHNIQUE: Contiguous axial images were obtained from the base of the skull through the vertex without intravenous contrast.  COMPARISON:  05/20/2014  FINDINGS: Bony calvarium is intact. No gross soft tissue abnormality is seen. There is been interval improvement in the degree of opacification within the left maxillary antrum. Diffuse atrophic changes are noted. Mild chronic white matter ischemic change is seen. No findings to suggest acute hemorrhage, acute infarction or space-occupying mass lesion are noted.  IMPRESSION: Chronic atrophic in ischemic changes without acute abnormality.   Electronically Signed   By: Inez Catalina M.D.   On: 07/09/2014 17:57   Mr Jeri Cos FO Contrast  07/11/2014   CLINICAL DATA:  Acute encephalopathy.  EXAM: MRI HEAD WITHOUT AND WITH CONTRAST  TECHNIQUE: Multiplanar, multiecho pulse sequences of the brain and surrounding structures were obtained without and with intravenous contrast.  CONTRAST:  72mL MULTIHANCE GADOBENATE DIMEGLUMINE 529 MG/ML IV SOLN  COMPARISON:  Head CT 07/09/2014 and MRI 05/23/2011  FINDINGS: There is no evidence of acute infarct, mass, midline shift, or extra-axial fluid collection. Small foci of chronic hemorrhage are noted at the left temporoparietal junction and in the left occipital lobe. There is moderate cerebral and cerebellar atrophy. Periventricular white matter T2 hyperintensities are similar to the prior MRI and  nonspecific but compatible with mild chronic small vessel ischemic disease. No abnormal enhancement is identified.  Orbits are unremarkable. Mild left maxillary sinus mucosal thickening and small left larger than right mastoid effusions are noted. Major intracranial vascular flow voids are preserved.  IMPRESSION: 1. No acute intracranial abnormality or mass. 2. Moderate cerebral atrophy and mild chronic small vessel ischemic disease.   Electronically Signed   By: Logan Bores   On: 07/11/2014 09:29   Dg Chest Port 1 View  07/10/2014   CLINICAL DATA:  Pneumonia.  EXAM: PORTABLE CHEST - 1 VIEW  COMPARISON:  07/09/2014.  FINDINGS: Right PICC line stable position. mediastinum and hilar structures normal. Stable cardiomegaly. Diffuse right lung infiltrate. Mild infiltrate atelectasis left lower lobe. Small right pleural effusion. No pneumothorax.  IMPRESSION: 1. Right PICC line in stable position. 2. Diffuse right lung infiltrate. Mild left lower lobe infiltrate and subsegmental atelectasis . These findings are consistent with pneumonia.   Electronically Signed   By: [place]   On: 07/10/2014 15:36   Dg Chest Port 1 View  07/02/2014   CLINICAL DATA:  Atelectasis.  EXAM: PORTABLE CHEST - 1 VIEW  COMPARISON:  06/30/2014.  FINDINGS: Interim removal of endotracheal tube. Right PICC line in stable position. Mild fullness of the mediastinum noted, this is most  likely related to tortuous thoracic aorta and prominent great vessels as well as AP portable technique . Cardiomegaly with mild pulmonary vascular prominence interstitial prominence suggesting mild congestive heart failure. Bibasilar subsegmental atelectasis. Tiny bilateral pleural effusions cannot be excluded. No pneumothorax.  IMPRESSION: 1. Right PICC line in stable position. 2. Cardiomegaly with mild pulmonary vascular prominence and mild interstitial prominence suggesting mild congestive heart failure. Tiny bilateral pleural effusions cannot be  excluded. 3. New onset of subsegmental atelectasis both lung bases.   Electronically Signed   By: Marcello Moores  Register   On: 07/02/2014 07:39   Dg Chest Portable 1 View  06/30/2014   CLINICAL DATA:  Endotracheal tube placement. Difficulty breathing. Fever.  EXAM: PORTABLE CHEST - 1 VIEW  COMPARISON:  05/28/2014  FINDINGS: Endotracheal tube tip measures 2.6 cm above the carina. Right PICC line tip overlies the upper SVC region. No pneumothorax. Shallow inspiration. Heart size and pulmonary vascularity are normal. No focal airspace disease or consolidation in the lungs. No blunting of costophrenic angles. Tortuous aorta.  IMPRESSION: Appliances appear to be in satisfactory location. No evidence of active pulmonary disease.   Electronically Signed   By: Lucienne Capers M.D.   On: 06/30/2014 00:46   Dg Swallowing Func-speech Pathology  07/12/2014   Valere Dross, CCC-SLP     07/12/2014 10:32 AM  Objective Swallowing Evaluation:  (MBS)  Patient Details  Name: [name] MRN: [id number] Date of Birth: [birth year]  Today's Date: 07/12/2014 Time: SLP Start Time (ACUTE ONLY): 0908-SLP Stop Time (ACUTE  ONLY): 0926 SLP Time Calculation (min) (ACUTE ONLY): 18 min  Past Medical History:  Past Medical History  Diagnosis Date  . Anxiety   . Dyspnea   . Diabetes mellitus   . Osteoarthritis   . Hypercholesteremia   . Depression   . RLS (restless legs syndrome)   . Stress   . PONV (postoperative nausea and vomiting)   . Headache(784.0)   . HTN (hypertension)     dr Johnsie Cancel   Past Surgical History:  Past Surgical History  Procedure Laterality Date  . Colonoscopy    . Abdominal hysterectomy    . Lumbar laminectomy/decompression microdiscectomy N/A 04/18/2012    Procedure: LUMBAR LAMINECTOMY/DECOMPRESSION MICRODISCECTOMY 3  LEVELS;  Surgeon: Winfield Cunas, MD;  Location: [facility];   Service: Neurosurgery;  Laterality: N/A;  Lumbar three-four,  lumbar four-five, lumbar five-sacral one decompression. Synovial  cyst resection  lumbar four-five  . Joint replacement    . Left forearm fracture with orif Left   . Replacement total knee      right   HPI:  Other Pertinent Information: ANASTYN AYARS is a 74 y.o.  female who had a recent hospitalization complicated by severe  sepsis due to UTI resulting in encephalopathy and acute renal  failure as well as acute respiratory failure requiring  intubation. she was discharged to nursing facility on May 13  Daughter states that while in at the nursing facility, patient   had a  swallow evaluation showed evidence of significant  aspiration. Family states that they were told that all the food  that is swallowed goes directly into the lungs. At nursing home  facility they made nothing by mouth. Per family she was not  having history of coughing and shortness of breath with fever  since her discharge. Her GI doctor has been called and  recommended transfer to emergency department. ER physician spoke  to Dr. Benson Norway who is on-call who states that GI  will consult and  patient may need endoscopy to clarify significant dysphagia. MBS  recommeded following BSE.  No Data Recorded  Assessment / Plan / Recommendation CHL IP CLINICAL IMPRESSIONS 07/12/2014  Therapy Diagnosis (None)  Clinical Impression Mild oral dysphagia with delayed transit and  moderate-severe sensorimotor pharyngeal dysphagia. Decreased  sensation resulted in valleculae initation site of swallow and  maximum vallecular and pyriform sinus residue. Pt unable to  produce volitional swallows provided max tactile and verbal  assist with demonstration and dry spoon trials. Silent aspiration  of honey thick present and risk of aspiration from residue is  high. Recommendation based on results are NPO however discussed  with MD and family desires to accept risk for comfort feeds. SLP  will follow up briefly for educatio re: safest swallow  precautions (upright position, no straws, 2 swallows using dry  spoon, sit up one hour after meals).       CHL IP  TREATMENT RECOMMENDATION 07/12/2014  Treatment Recommendations Therapy as outlined in treatment plan  below     CHL IP DIET RECOMMENDATION 07/12/2014  SLP Diet Recommendations Dysphagia 1 (Puree)  Liquid Administration via (None)  Medication Administration Crushed with puree  Compensations Slow rate;Small sips/bites;Multiple dry swallows  after each bite/sip;Clear throat intermittently  Postural Changes and/or Swallow Maneuvers (None)     CHL IP OTHER RECOMMENDATIONS 07/12/2014  Recommended Consults (None)  Oral Care Recommendations Oral care BID Other Recommendations  (None)     CHL IP FOLLOW UP RECOMMENDATIONS 07/11/2014  Follow up Recommendations Skilled Nursing facility     [facility] IP FREQUENCY AND DURATION 07/12/2014  Speech Therapy Frequency (ACUTE ONLY) min 2x/week  Treatment Duration 2 weeks     Pertinent Vitals/Pain none    SLP Swallow Goals No flowsheet data found.  No flowsheet data found.    CHL IP REASON FOR REFERRAL 07/12/2014  Reason for Referral Objectively evaluate swallowing function     CHL IP ORAL PHASE 07/12/2014  Lips (None)  Tongue (None)  Mucous membranes (None)  Nutritional status (None)  Other (None)  Oxygen therapy (None)  Oral Phase Impaired  Oral - Pudding Teaspoon (None)  Oral - Pudding Cup (None)  Oral - Honey Teaspoon (None)  Oral - Honey Cup (None)  Oral - Honey Syringe (None)  Oral - Nectar Teaspoon (None)  Oral - Nectar Cup (None)  Oral - Nectar Straw (None)  Oral - Nectar Syringe (None)  Oral - Ice Chips (None)  Oral - Thin Teaspoon (None)  Oral - Thin Cup (None)  Oral - Thin Straw (None)  Oral - Thin Syringe (None)  Oral - Puree (None)  Oral - Mechanical Soft (None)  Oral - Regular (None)  Oral - Multi-consistency (None)  Oral - Pill (None)  Oral Phase - Comment (None)      CHL IP PHARYNGEAL PHASE 07/12/2014  Pharyngeal Phase Impaired  Pharyngeal - Pudding Teaspoon (None)  Penetration/Aspiration details (pudding teaspoon) (None)  Pharyngeal - Pudding Cup (None)  Penetration/Aspiration  details (pudding cup) (None)  Pharyngeal - Honey Teaspoon (None)  Penetration/Aspiration details (honey teaspoon) (None)  Pharyngeal - Honey Cup (None)  Penetration/Aspiration details (honey cup) (None)  Pharyngeal - Honey Syringe (None)  Penetration/Aspiration details (honey syringe) (None)  Pharyngeal - Nectar Teaspoon (None)  Penetration/Aspiration details (nectar teaspoon) (None)  Pharyngeal - Nectar Cup (None)  Penetration/Aspiration details (nectar cup) (None)  Pharyngeal - Nectar Straw (None)  Penetration/Aspiration details (nectar straw) (None)  Pharyngeal - Nectar Syringe (None)  Penetration/Aspiration details (nectar syringe) (None)  Pharyngeal - Ice Chips (None)  Penetration/Aspiration details (ice chips) (None)  Pharyngeal - Thin Teaspoon (None)  Penetration/Aspiration details (thin teaspoon) (None)  Pharyngeal - Thin Cup (None)  Penetration/Aspiration details (thin cup) (None)  Pharyngeal - Thin Straw (None)  Penetration/Aspiration details (thin straw) (None)  Pharyngeal - Thin Syringe (None)  Penetration/Aspiration details (thin syringe') (None)  Pharyngeal - Puree (None)  Penetration/Aspiration details (puree) (None)  Pharyngeal - Mechanical Soft (None)  Penetration/Aspiration details (mechanical soft) (None)  Pharyngeal - Regular (None)  Penetration/Aspiration details (regular) (None)  Pharyngeal - Multi-consistency (None)  Penetration/Aspiration details (multi-consistency) (None)  Pharyngeal - Pill (None)  Penetration/Aspiration details (pill) (None) Pharyngeal Comment  (None)      CHL IP CERVICAL ESOPHAGEAL PHASE 07/12/2014  Cervical Esophageal Phase WFL  Pudding Teaspoon (None)  Pudding Cup (None)  Honey Teaspoon (None)  Honey Cup (None)  Honey Straw (None)  Nectar Teaspoon (None)  Nectar Cup (None)  Nectar Straw (None)  Nectar Sippy Cup (None)  Thin Teaspoon (None)  Thin Cup (None)  Thin Straw (None)  Thin Sippy Cup (None)  Cervical Esophageal Comment (None)    No flowsheet data found.          Houston Siren 07/12/2014, 10:31 AM  Orbie Pyo Colvin Caroli.Ed CCC-SLP Pager [phone]      Microbiology: Recent Results (from the past 240 hour(s))  Culture, blood (routine x 2)     Status: None (Preliminary result)   Collection Time: 07/20/14 10:22 PM  Result Value Ref Range Status   Specimen Description BLOOD RIGHT ARM PICC  Final   Special Requests BOTTLES DRAWN AEROBIC AND ANAEROBIC 5CC  Final   Culture   Final           BLOOD CULTURE RECEIVED NO GROWTH TO DATE CULTURE WILL BE HELD FOR 5 DAYS BEFORE ISSUING A FINAL NEGATIVE REPORT Performed at [facility]    Report Status PENDING  Incomplete  Urine culture     Status: None   Collection Time: 07/20/14 10:28 PM  Result Value Ref Range Status   Specimen Description URINE, CATHETERIZED  Final   Special Requests Normal  Final   Colony Count NO GROWTH Performed at [facility]   Final   Culture NO GROWTH Performed at [facility]   Final   Report Status 07/22/2014 FINAL  Final  Culture, blood (routine x 2)     Status: None (Preliminary result)   Collection Time: 07/20/14 10:45 PM  Result Value Ref Range Status   Specimen Description BLOOD LEFT HAND  Final   Special Requests BOTTLES DRAWN AEROBIC AND ANAEROBIC 5CC  Final   Culture   Final           BLOOD CULTURE RECEIVED NO GROWTH TO DATE CULTURE WILL BE HELD FOR 5 DAYS BEFORE ISSUING A FINAL NEGATIVE REPORT Performed at [facility]    Report Status PENDING  Incomplete     Labs: Basic Metabolic Panel:  Recent Labs Lab 07/20/14 2222 07/21/14 0811 07/22/14 0925  NA 148* 149* 149*  K 4.8 3.7 3.1*  CL 109 114* 110  CO2 29 28 28   GLUCOSE 326* 230* 202*  BUN 38* 37* 32*  CREATININE 2.68* 2.38* 2.09*  CALCIUM 10.0 9.3 9.6   Liver Function Tests:  Recent Labs Lab 07/20/14 2222 07/21/14 0811  AST 11* 8*  ALT 10* 8*  ALKPHOS 63 51  BILITOT 0.7 0.6  PROT 7.3 6.6  ALBUMIN 3.3* 3.1*   No results for  input(s): LIPASE, AMYLASE  in the last 168 hours. No results for input(s): AMMONIA in the last 168 hours. CBC:  Recent Labs Lab 07/20/14 2222 07/21/14 0811  WBC 11.6* 7.7  NEUTROABS 9.5*  --   HGB 10.0* 9.0*  HCT 32.1* 29.6*  MCV 84.3 86.0  PLT 267 221   Cardiac Enzymes: No results for input(s): CKTOTAL, CKMB, CKMBINDEX, TROPONINI in the last 168 hours. BNP: BNP (last 3 results)  Recent Labs  06/30/14 0711 07/09/14 1434  BNP 202.3* 144.3*    ProBNP (last 3 results) No results for input(s): PROBNP in the last 8760 hours.  CBG:  Recent Labs Lab 07/21/14 1621 07/21/14 2019 07/21/14 2343 07/22/14 0347 07/22/14 0741  GLUCAP 114* 221* 189* 156* 167*

## 2014-07-22 NOTE — Care Management Note (Signed)
Case Management Note  Patient Details  Name: [name] MRN: [id number] Date of Birth: May 01, 1940  Subjective/Objective:                  74 yo female admitted with aspiration pna  Action/Plan:  Per palliative care recommendation. Patient is not eligible for residential hospice services at this time. Plan is for patient to return to SNF with Palliative care services in place. CSW, Roselyn Reef, aware and working with patient and family.  Expected Discharge Date:                  Expected Discharge Plan:  Skilled Nursing Facility  In-House Referral:  Clinical Social Work  Discharge planning Services  CM Consult  Post Acute Care Choice:  NA Choice offered to:  NA  DME Arranged:    DME Agency:     HH Arranged:    [place] Agency:     Status of Service:  Completed, signed off  Medicare Important Message Given:  N/A - LOS <3 / Initial given by admissions Date Medicare IM Given:    Medicare IM give by:    Date Additional Medicare IM Given:    Additional Medicare Important Message give by:     If discussed at [place] of Stay Meetings, dates discussed:    Additional Comments:  Scot Dock, RN 07/22/2014, 11:35 AM

## 2014-07-22 NOTE — Discharge Instructions (Signed)

## 2014-07-22 NOTE — Clinical Social Work Note (Signed)
Clinical Social Work Assessment  Patient Details  Name: [name] MRN: [id number] Date of Birth: 02-26-1940  Date of referral:  07/22/14               Reason for consult:  Facility Placement, Discharge Planning                Permission sought to share information with:    Permission granted to share information::     Name::        Agency::     Relationship::     Contact Information:     Housing/Transportation Living arrangements for the past 2 months:  [place] of Information:  Adult Children Patient Interpreter Needed:  None Criminal Activity/Legal Involvement Pertinent to Current Situation/Hospitalization:  No - Comment as needed Significant Relationships:  Adult Children Lives with:  Facility Resident Do you feel safe going back to the place where you live?  Yes Need for family participation in patient care:  Yes (Comment)  Care giving concerns:  Pt's daughter spoke with daughter to make sure pt will be treated for pneumonia prior to d/c.   Social Worker assessment / plan:  Pt hospitalized on 07/21/14 with a dx of aspiration pneumonia. Pt is from [facility]. Palliative Care consulted. CSW was unable to assess pt ( dementia, sleeping at time of visit. ). Pt 's daughter was contacted to assist with d/c planning. Pt is ready to return to [facility] today. Daughter is in agreement with plan. [facility]AR transport is required. D/c Summary, scripts, avs reviewed by nursing. D/c summary sent to SNF for review. Scripts included in d/c packet.  Employment status:  Retired Forensic scientist:  Medicare PT Recommendations:  Not assessed at this time Information / Referral to community resources:   (None needed at this time.)  Patient/Family's Response to care:  Daughter in agreement with plan for pt to return to SNF today.  Patient/Family's Understanding of and Emotional Response to Diagnosis, Current Treatment, and Prognosis:  MD explained to daughter  that pneumonia was being treated but may still return. Daughter appreciates Palliative Care consult.  Emotional Assessment Appearance:  Appears stated age Attitude/Demeanor/Rapport:  Unable to Assess Affect (typically observed):  Unable to Assess Orientation:  Oriented to Self Alcohol / Substance use:    Psych involvement (Current and /or in the community):  No (Comment)  Discharge Needs  Concerns to be addressed:  Discharge Planning Concerns Readmission within the last 30 days:  Yes Current discharge risk:  None Barriers to Discharge:  No Barriers Identified   [name], Michelle An, LCSW 07/22/2014, 2:06 PM

## 2014-07-27 LAB — CULTURE, BLOOD (ROUTINE X 2)
Culture: NO GROWTH
Culture: NO GROWTH

## 2014-08-02 DIAGNOSIS — F0390 Unspecified dementia without behavioral disturbance: Secondary | ICD-10-CM | POA: Diagnosis not present

## 2014-08-02 DIAGNOSIS — R1310 Dysphagia, unspecified: Secondary | ICD-10-CM | POA: Diagnosis not present

## 2014-08-02 DIAGNOSIS — J9589 Other postprocedural complications and disorders of respiratory system, not elsewhere classified: Secondary | ICD-10-CM | POA: Diagnosis not present

## 2014-08-02 DIAGNOSIS — G9340 Encephalopathy, unspecified: Secondary | ICD-10-CM | POA: Diagnosis not present

## 2014-08-02 DIAGNOSIS — E119 Type 2 diabetes mellitus without complications: Secondary | ICD-10-CM | POA: Diagnosis not present

## 2014-08-02 DIAGNOSIS — I10 Essential (primary) hypertension: Secondary | ICD-10-CM | POA: Diagnosis not present

## 2014-09-24 DIAGNOSIS — J9601 Acute respiratory failure with hypoxia: Secondary | ICD-10-CM | POA: Diagnosis not present

## 2014-09-24 DIAGNOSIS — G9340 Encephalopathy, unspecified: Secondary | ICD-10-CM | POA: Diagnosis not present

## 2014-09-24 DIAGNOSIS — A419 Sepsis, unspecified organism: Secondary | ICD-10-CM | POA: Diagnosis not present

## 2014-09-24 DIAGNOSIS — M6281 Muscle weakness (generalized): Secondary | ICD-10-CM | POA: Diagnosis not present

## 2014-09-25 DIAGNOSIS — G9340 Encephalopathy, unspecified: Secondary | ICD-10-CM | POA: Diagnosis not present

## 2014-09-25 DIAGNOSIS — M6281 Muscle weakness (generalized): Secondary | ICD-10-CM | POA: Diagnosis not present

## 2014-09-25 DIAGNOSIS — A419 Sepsis, unspecified organism: Secondary | ICD-10-CM | POA: Diagnosis not present

## 2014-09-25 DIAGNOSIS — J9601 Acute respiratory failure with hypoxia: Secondary | ICD-10-CM | POA: Diagnosis not present

## 2014-09-29 DIAGNOSIS — G9340 Encephalopathy, unspecified: Secondary | ICD-10-CM | POA: Diagnosis not present

## 2014-09-29 DIAGNOSIS — K219 Gastro-esophageal reflux disease without esophagitis: Secondary | ICD-10-CM | POA: Diagnosis not present

## 2014-09-29 DIAGNOSIS — E870 Hyperosmolality and hypernatremia: Secondary | ICD-10-CM | POA: Diagnosis not present

## 2014-09-29 DIAGNOSIS — E119 Type 2 diabetes mellitus without complications: Secondary | ICD-10-CM | POA: Diagnosis not present

## 2014-09-29 DIAGNOSIS — I10 Essential (primary) hypertension: Secondary | ICD-10-CM | POA: Diagnosis not present

## 2014-09-29 DIAGNOSIS — R1311 Dysphagia, oral phase: Secondary | ICD-10-CM | POA: Diagnosis not present

## 2014-09-29 DIAGNOSIS — M6281 Muscle weakness (generalized): Secondary | ICD-10-CM | POA: Diagnosis not present

## 2014-09-29 DIAGNOSIS — J9601 Acute respiratory failure with hypoxia: Secondary | ICD-10-CM | POA: Diagnosis not present

## 2014-09-29 DIAGNOSIS — L8990 Pressure ulcer of unspecified site, unspecified stage: Secondary | ICD-10-CM | POA: Diagnosis not present

## 2014-09-29 DIAGNOSIS — J189 Pneumonia, unspecified organism: Secondary | ICD-10-CM | POA: Diagnosis not present

## 2014-09-29 DIAGNOSIS — A419 Sepsis, unspecified organism: Secondary | ICD-10-CM | POA: Diagnosis not present

## 2014-09-29 DIAGNOSIS — E441 Mild protein-calorie malnutrition: Secondary | ICD-10-CM | POA: Diagnosis not present

## 2014-09-29 DIAGNOSIS — D638 Anemia in other chronic diseases classified elsewhere: Secondary | ICD-10-CM | POA: Diagnosis not present

## 2014-10-01 DIAGNOSIS — G9340 Encephalopathy, unspecified: Secondary | ICD-10-CM | POA: Diagnosis not present

## 2014-10-01 DIAGNOSIS — A419 Sepsis, unspecified organism: Secondary | ICD-10-CM | POA: Diagnosis not present

## 2014-10-01 DIAGNOSIS — M6281 Muscle weakness (generalized): Secondary | ICD-10-CM | POA: Diagnosis not present

## 2014-10-01 DIAGNOSIS — J9601 Acute respiratory failure with hypoxia: Secondary | ICD-10-CM | POA: Diagnosis not present

## 2014-10-03 DIAGNOSIS — M6281 Muscle weakness (generalized): Secondary | ICD-10-CM | POA: Diagnosis not present

## 2014-10-03 DIAGNOSIS — G9340 Encephalopathy, unspecified: Secondary | ICD-10-CM | POA: Diagnosis not present

## 2014-10-03 DIAGNOSIS — A419 Sepsis, unspecified organism: Secondary | ICD-10-CM | POA: Diagnosis not present

## 2014-10-03 DIAGNOSIS — J9601 Acute respiratory failure with hypoxia: Secondary | ICD-10-CM | POA: Diagnosis not present

## 2014-10-06 DIAGNOSIS — M6281 Muscle weakness (generalized): Secondary | ICD-10-CM | POA: Diagnosis not present

## 2014-10-06 DIAGNOSIS — A419 Sepsis, unspecified organism: Secondary | ICD-10-CM | POA: Diagnosis not present

## 2014-10-06 DIAGNOSIS — J9601 Acute respiratory failure with hypoxia: Secondary | ICD-10-CM | POA: Diagnosis not present

## 2014-10-06 DIAGNOSIS — G9340 Encephalopathy, unspecified: Secondary | ICD-10-CM | POA: Diagnosis not present

## 2014-10-07 DIAGNOSIS — M6281 Muscle weakness (generalized): Secondary | ICD-10-CM | POA: Diagnosis not present

## 2014-10-07 DIAGNOSIS — G9340 Encephalopathy, unspecified: Secondary | ICD-10-CM | POA: Diagnosis not present

## 2014-10-07 DIAGNOSIS — J9601 Acute respiratory failure with hypoxia: Secondary | ICD-10-CM | POA: Diagnosis not present

## 2014-10-07 DIAGNOSIS — A419 Sepsis, unspecified organism: Secondary | ICD-10-CM | POA: Diagnosis not present

## 2014-10-08 DIAGNOSIS — A419 Sepsis, unspecified organism: Secondary | ICD-10-CM | POA: Diagnosis not present

## 2014-10-08 DIAGNOSIS — M6281 Muscle weakness (generalized): Secondary | ICD-10-CM | POA: Diagnosis not present

## 2014-10-08 DIAGNOSIS — G9340 Encephalopathy, unspecified: Secondary | ICD-10-CM | POA: Diagnosis not present

## 2014-10-08 DIAGNOSIS — J9601 Acute respiratory failure with hypoxia: Secondary | ICD-10-CM | POA: Diagnosis not present

## 2014-10-13 DIAGNOSIS — J9601 Acute respiratory failure with hypoxia: Secondary | ICD-10-CM | POA: Diagnosis not present

## 2014-10-13 DIAGNOSIS — G9340 Encephalopathy, unspecified: Secondary | ICD-10-CM | POA: Diagnosis not present

## 2014-10-13 DIAGNOSIS — M6281 Muscle weakness (generalized): Secondary | ICD-10-CM | POA: Diagnosis not present

## 2014-10-13 DIAGNOSIS — A419 Sepsis, unspecified organism: Secondary | ICD-10-CM | POA: Diagnosis not present

## 2014-10-15 DIAGNOSIS — A419 Sepsis, unspecified organism: Secondary | ICD-10-CM | POA: Diagnosis not present

## 2014-10-15 DIAGNOSIS — J9601 Acute respiratory failure with hypoxia: Secondary | ICD-10-CM | POA: Diagnosis not present

## 2014-10-15 DIAGNOSIS — M6281 Muscle weakness (generalized): Secondary | ICD-10-CM | POA: Diagnosis not present

## 2014-10-15 DIAGNOSIS — G9340 Encephalopathy, unspecified: Secondary | ICD-10-CM | POA: Diagnosis not present

## 2014-10-17 DIAGNOSIS — A419 Sepsis, unspecified organism: Secondary | ICD-10-CM | POA: Diagnosis not present

## 2014-10-17 DIAGNOSIS — J9601 Acute respiratory failure with hypoxia: Secondary | ICD-10-CM | POA: Diagnosis not present

## 2014-10-17 DIAGNOSIS — M6281 Muscle weakness (generalized): Secondary | ICD-10-CM | POA: Diagnosis not present

## 2014-10-17 DIAGNOSIS — G9340 Encephalopathy, unspecified: Secondary | ICD-10-CM | POA: Diagnosis not present

## 2014-10-18 DIAGNOSIS — M6281 Muscle weakness (generalized): Secondary | ICD-10-CM | POA: Diagnosis not present

## 2014-10-18 DIAGNOSIS — A419 Sepsis, unspecified organism: Secondary | ICD-10-CM | POA: Diagnosis not present

## 2014-10-18 DIAGNOSIS — J9601 Acute respiratory failure with hypoxia: Secondary | ICD-10-CM | POA: Diagnosis not present

## 2014-10-18 DIAGNOSIS — G9340 Encephalopathy, unspecified: Secondary | ICD-10-CM | POA: Diagnosis not present

## 2014-10-21 DIAGNOSIS — M6281 Muscle weakness (generalized): Secondary | ICD-10-CM | POA: Diagnosis not present

## 2014-10-21 DIAGNOSIS — J9601 Acute respiratory failure with hypoxia: Secondary | ICD-10-CM | POA: Diagnosis not present

## 2014-10-21 DIAGNOSIS — G9340 Encephalopathy, unspecified: Secondary | ICD-10-CM | POA: Diagnosis not present

## 2014-10-21 DIAGNOSIS — A419 Sepsis, unspecified organism: Secondary | ICD-10-CM | POA: Diagnosis not present

## 2014-10-22 DIAGNOSIS — J9601 Acute respiratory failure with hypoxia: Secondary | ICD-10-CM | POA: Diagnosis not present

## 2014-10-22 DIAGNOSIS — G9340 Encephalopathy, unspecified: Secondary | ICD-10-CM | POA: Diagnosis not present

## 2014-10-22 DIAGNOSIS — M6281 Muscle weakness (generalized): Secondary | ICD-10-CM | POA: Diagnosis not present

## 2014-10-22 DIAGNOSIS — A419 Sepsis, unspecified organism: Secondary | ICD-10-CM | POA: Diagnosis not present

## 2014-10-23 DIAGNOSIS — G9340 Encephalopathy, unspecified: Secondary | ICD-10-CM | POA: Diagnosis not present

## 2014-10-23 DIAGNOSIS — M6281 Muscle weakness (generalized): Secondary | ICD-10-CM | POA: Diagnosis not present

## 2014-10-23 DIAGNOSIS — J9601 Acute respiratory failure with hypoxia: Secondary | ICD-10-CM | POA: Diagnosis not present

## 2014-10-23 DIAGNOSIS — A419 Sepsis, unspecified organism: Secondary | ICD-10-CM | POA: Diagnosis not present

## 2014-10-28 DIAGNOSIS — G9340 Encephalopathy, unspecified: Secondary | ICD-10-CM | POA: Diagnosis not present

## 2014-10-28 DIAGNOSIS — J9601 Acute respiratory failure with hypoxia: Secondary | ICD-10-CM | POA: Diagnosis not present

## 2014-10-28 DIAGNOSIS — A419 Sepsis, unspecified organism: Secondary | ICD-10-CM | POA: Diagnosis not present

## 2014-10-28 DIAGNOSIS — M6281 Muscle weakness (generalized): Secondary | ICD-10-CM | POA: Diagnosis not present

## 2014-10-29 DIAGNOSIS — A419 Sepsis, unspecified organism: Secondary | ICD-10-CM | POA: Diagnosis not present

## 2014-10-29 DIAGNOSIS — M6281 Muscle weakness (generalized): Secondary | ICD-10-CM | POA: Diagnosis not present

## 2014-10-29 DIAGNOSIS — G9340 Encephalopathy, unspecified: Secondary | ICD-10-CM | POA: Diagnosis not present

## 2014-10-29 DIAGNOSIS — J9601 Acute respiratory failure with hypoxia: Secondary | ICD-10-CM | POA: Diagnosis not present

## 2014-11-12 DIAGNOSIS — E119 Type 2 diabetes mellitus without complications: Secondary | ICD-10-CM | POA: Diagnosis not present

## 2014-11-12 DIAGNOSIS — I10 Essential (primary) hypertension: Secondary | ICD-10-CM | POA: Diagnosis not present

## 2014-12-10 DIAGNOSIS — N184 Chronic kidney disease, stage 4 (severe): Secondary | ICD-10-CM | POA: Diagnosis not present

## 2014-12-10 DIAGNOSIS — L8990 Pressure ulcer of unspecified site, unspecified stage: Secondary | ICD-10-CM | POA: Diagnosis not present

## 2014-12-10 DIAGNOSIS — E440 Moderate protein-calorie malnutrition: Secondary | ICD-10-CM | POA: Diagnosis not present

## 2014-12-10 DIAGNOSIS — D649 Anemia, unspecified: Secondary | ICD-10-CM | POA: Diagnosis not present

## 2014-12-10 DIAGNOSIS — R1312 Dysphagia, oropharyngeal phase: Secondary | ICD-10-CM | POA: Diagnosis not present

## 2014-12-10 DIAGNOSIS — E119 Type 2 diabetes mellitus without complications: Secondary | ICD-10-CM | POA: Diagnosis not present

## 2014-12-25 DIAGNOSIS — D649 Anemia, unspecified: Secondary | ICD-10-CM | POA: Diagnosis not present

## 2014-12-25 DIAGNOSIS — E785 Hyperlipidemia, unspecified: Secondary | ICD-10-CM | POA: Diagnosis not present

## 2014-12-25 DIAGNOSIS — I10 Essential (primary) hypertension: Secondary | ICD-10-CM | POA: Diagnosis not present

## 2014-12-29 DIAGNOSIS — E119 Type 2 diabetes mellitus without complications: Secondary | ICD-10-CM | POA: Diagnosis not present

## 2014-12-29 DIAGNOSIS — H2513 Age-related nuclear cataract, bilateral: Secondary | ICD-10-CM | POA: Diagnosis not present

## 2015-01-02 DIAGNOSIS — R1312 Dysphagia, oropharyngeal phase: Secondary | ICD-10-CM | POA: Diagnosis not present

## 2015-01-04 DIAGNOSIS — R1312 Dysphagia, oropharyngeal phase: Secondary | ICD-10-CM | POA: Diagnosis not present

## 2015-01-05 DIAGNOSIS — R1312 Dysphagia, oropharyngeal phase: Secondary | ICD-10-CM | POA: Diagnosis not present

## 2015-01-07 DIAGNOSIS — R1312 Dysphagia, oropharyngeal phase: Secondary | ICD-10-CM | POA: Diagnosis not present

## 2015-01-08 DIAGNOSIS — R1312 Dysphagia, oropharyngeal phase: Secondary | ICD-10-CM | POA: Diagnosis not present

## 2015-01-09 DIAGNOSIS — R1312 Dysphagia, oropharyngeal phase: Secondary | ICD-10-CM | POA: Diagnosis not present

## 2015-01-12 DIAGNOSIS — R1312 Dysphagia, oropharyngeal phase: Secondary | ICD-10-CM | POA: Diagnosis not present

## 2015-01-13 DIAGNOSIS — R1312 Dysphagia, oropharyngeal phase: Secondary | ICD-10-CM | POA: Diagnosis not present

## 2015-02-04 DIAGNOSIS — K219 Gastro-esophageal reflux disease without esophagitis: Secondary | ICD-10-CM | POA: Diagnosis not present

## 2015-02-04 DIAGNOSIS — E441 Mild protein-calorie malnutrition: Secondary | ICD-10-CM | POA: Diagnosis not present

## 2015-02-04 DIAGNOSIS — I10 Essential (primary) hypertension: Secondary | ICD-10-CM | POA: Diagnosis not present

## 2015-02-04 DIAGNOSIS — R1311 Dysphagia, oral phase: Secondary | ICD-10-CM | POA: Diagnosis not present

## 2015-02-04 DIAGNOSIS — E559 Vitamin D deficiency, unspecified: Secondary | ICD-10-CM | POA: Diagnosis not present

## 2015-02-04 DIAGNOSIS — D649 Anemia, unspecified: Secondary | ICD-10-CM | POA: Diagnosis not present

## 2015-02-04 DIAGNOSIS — E870 Hyperosmolality and hypernatremia: Secondary | ICD-10-CM | POA: Diagnosis not present

## 2015-02-04 DIAGNOSIS — N182 Chronic kidney disease, stage 2 (mild): Secondary | ICD-10-CM | POA: Diagnosis not present

## 2015-02-04 DIAGNOSIS — E119 Type 2 diabetes mellitus without complications: Secondary | ICD-10-CM | POA: Diagnosis not present

## 2015-03-06 DIAGNOSIS — E870 Hyperosmolality and hypernatremia: Secondary | ICD-10-CM | POA: Diagnosis not present

## 2015-03-06 DIAGNOSIS — E441 Mild protein-calorie malnutrition: Secondary | ICD-10-CM | POA: Diagnosis not present

## 2015-03-06 DIAGNOSIS — D509 Iron deficiency anemia, unspecified: Secondary | ICD-10-CM | POA: Diagnosis not present

## 2015-03-06 DIAGNOSIS — I10 Essential (primary) hypertension: Secondary | ICD-10-CM | POA: Diagnosis not present

## 2015-03-06 DIAGNOSIS — N182 Chronic kidney disease, stage 2 (mild): Secondary | ICD-10-CM | POA: Diagnosis not present

## 2015-03-06 DIAGNOSIS — E119 Type 2 diabetes mellitus without complications: Secondary | ICD-10-CM | POA: Diagnosis not present

## 2015-03-06 DIAGNOSIS — K219 Gastro-esophageal reflux disease without esophagitis: Secondary | ICD-10-CM | POA: Diagnosis not present

## 2015-03-06 DIAGNOSIS — F0390 Unspecified dementia without behavioral disturbance: Secondary | ICD-10-CM | POA: Diagnosis not present

## 2015-03-06 DIAGNOSIS — E559 Vitamin D deficiency, unspecified: Secondary | ICD-10-CM | POA: Diagnosis not present

## 2015-03-06 DIAGNOSIS — L8995 Pressure ulcer of unspecified site, unstageable: Secondary | ICD-10-CM | POA: Diagnosis not present

## 2015-03-12 DIAGNOSIS — R262 Difficulty in walking, not elsewhere classified: Secondary | ICD-10-CM | POA: Diagnosis not present

## 2015-03-13 DIAGNOSIS — R262 Difficulty in walking, not elsewhere classified: Secondary | ICD-10-CM | POA: Diagnosis not present

## 2015-03-16 DIAGNOSIS — R262 Difficulty in walking, not elsewhere classified: Secondary | ICD-10-CM | POA: Diagnosis not present

## 2015-03-17 DIAGNOSIS — R262 Difficulty in walking, not elsewhere classified: Secondary | ICD-10-CM | POA: Diagnosis not present

## 2015-03-18 DIAGNOSIS — R262 Difficulty in walking, not elsewhere classified: Secondary | ICD-10-CM | POA: Diagnosis not present

## 2015-03-19 DIAGNOSIS — R262 Difficulty in walking, not elsewhere classified: Secondary | ICD-10-CM | POA: Diagnosis not present

## 2015-03-20 DIAGNOSIS — R262 Difficulty in walking, not elsewhere classified: Secondary | ICD-10-CM | POA: Diagnosis not present

## 2015-03-21 DIAGNOSIS — R262 Difficulty in walking, not elsewhere classified: Secondary | ICD-10-CM | POA: Diagnosis not present

## 2015-03-23 DIAGNOSIS — R262 Difficulty in walking, not elsewhere classified: Secondary | ICD-10-CM | POA: Diagnosis not present

## 2015-03-24 DIAGNOSIS — R262 Difficulty in walking, not elsewhere classified: Secondary | ICD-10-CM | POA: Diagnosis not present

## 2015-03-25 DIAGNOSIS — R262 Difficulty in walking, not elsewhere classified: Secondary | ICD-10-CM | POA: Diagnosis not present

## 2015-03-26 DIAGNOSIS — R262 Difficulty in walking, not elsewhere classified: Secondary | ICD-10-CM | POA: Diagnosis not present

## 2015-03-27 DIAGNOSIS — R262 Difficulty in walking, not elsewhere classified: Secondary | ICD-10-CM | POA: Diagnosis not present

## 2015-03-29 DIAGNOSIS — R262 Difficulty in walking, not elsewhere classified: Secondary | ICD-10-CM | POA: Diagnosis not present

## 2015-03-30 DIAGNOSIS — R262 Difficulty in walking, not elsewhere classified: Secondary | ICD-10-CM | POA: Diagnosis not present

## 2015-03-31 DIAGNOSIS — R262 Difficulty in walking, not elsewhere classified: Secondary | ICD-10-CM | POA: Diagnosis not present

## 2015-04-02 DIAGNOSIS — R262 Difficulty in walking, not elsewhere classified: Secondary | ICD-10-CM | POA: Diagnosis not present

## 2015-04-02 DIAGNOSIS — E119 Type 2 diabetes mellitus without complications: Secondary | ICD-10-CM | POA: Diagnosis not present

## 2015-04-03 DIAGNOSIS — R262 Difficulty in walking, not elsewhere classified: Secondary | ICD-10-CM | POA: Diagnosis not present

## 2015-04-06 DIAGNOSIS — R262 Difficulty in walking, not elsewhere classified: Secondary | ICD-10-CM | POA: Diagnosis not present

## 2015-04-07 DIAGNOSIS — R262 Difficulty in walking, not elsewhere classified: Secondary | ICD-10-CM | POA: Diagnosis not present

## 2015-04-08 DIAGNOSIS — R262 Difficulty in walking, not elsewhere classified: Secondary | ICD-10-CM | POA: Diagnosis not present

## 2015-04-09 DIAGNOSIS — R262 Difficulty in walking, not elsewhere classified: Secondary | ICD-10-CM | POA: Diagnosis not present

## 2015-04-10 DIAGNOSIS — R262 Difficulty in walking, not elsewhere classified: Secondary | ICD-10-CM | POA: Diagnosis not present

## 2015-04-10 DIAGNOSIS — I10 Essential (primary) hypertension: Secondary | ICD-10-CM | POA: Diagnosis not present

## 2015-04-10 DIAGNOSIS — R05 Cough: Secondary | ICD-10-CM | POA: Diagnosis not present

## 2015-04-10 DIAGNOSIS — F0390 Unspecified dementia without behavioral disturbance: Secondary | ICD-10-CM | POA: Diagnosis not present

## 2015-04-10 DIAGNOSIS — E119 Type 2 diabetes mellitus without complications: Secondary | ICD-10-CM | POA: Diagnosis not present

## 2015-04-13 DIAGNOSIS — R262 Difficulty in walking, not elsewhere classified: Secondary | ICD-10-CM | POA: Diagnosis not present

## 2015-04-14 DIAGNOSIS — R262 Difficulty in walking, not elsewhere classified: Secondary | ICD-10-CM | POA: Diagnosis not present

## 2015-04-15 DIAGNOSIS — R262 Difficulty in walking, not elsewhere classified: Secondary | ICD-10-CM | POA: Diagnosis not present

## 2015-04-16 DIAGNOSIS — R262 Difficulty in walking, not elsewhere classified: Secondary | ICD-10-CM | POA: Diagnosis not present

## 2015-05-19 DIAGNOSIS — R05 Cough: Secondary | ICD-10-CM | POA: Diagnosis not present

## 2015-06-11 DIAGNOSIS — E1165 Type 2 diabetes mellitus with hyperglycemia: Secondary | ICD-10-CM | POA: Diagnosis not present

## 2015-06-11 DIAGNOSIS — M6281 Muscle weakness (generalized): Secondary | ICD-10-CM | POA: Diagnosis not present

## 2015-06-13 DIAGNOSIS — M6281 Muscle weakness (generalized): Secondary | ICD-10-CM | POA: Diagnosis not present

## 2015-06-13 DIAGNOSIS — E1165 Type 2 diabetes mellitus with hyperglycemia: Secondary | ICD-10-CM | POA: Diagnosis not present

## 2015-06-15 DIAGNOSIS — E1165 Type 2 diabetes mellitus with hyperglycemia: Secondary | ICD-10-CM | POA: Diagnosis not present

## 2015-06-15 DIAGNOSIS — M6281 Muscle weakness (generalized): Secondary | ICD-10-CM | POA: Diagnosis not present

## 2015-06-16 DIAGNOSIS — E1165 Type 2 diabetes mellitus with hyperglycemia: Secondary | ICD-10-CM | POA: Diagnosis not present

## 2015-06-16 DIAGNOSIS — M6281 Muscle weakness (generalized): Secondary | ICD-10-CM | POA: Diagnosis not present

## 2015-06-17 DIAGNOSIS — E1165 Type 2 diabetes mellitus with hyperglycemia: Secondary | ICD-10-CM | POA: Diagnosis not present

## 2015-06-17 DIAGNOSIS — M6281 Muscle weakness (generalized): Secondary | ICD-10-CM | POA: Diagnosis not present

## 2015-06-18 DIAGNOSIS — M6281 Muscle weakness (generalized): Secondary | ICD-10-CM | POA: Diagnosis not present

## 2015-06-18 DIAGNOSIS — E1165 Type 2 diabetes mellitus with hyperglycemia: Secondary | ICD-10-CM | POA: Diagnosis not present

## 2015-06-19 DIAGNOSIS — E1165 Type 2 diabetes mellitus with hyperglycemia: Secondary | ICD-10-CM | POA: Diagnosis not present

## 2015-06-19 DIAGNOSIS — M6281 Muscle weakness (generalized): Secondary | ICD-10-CM | POA: Diagnosis not present

## 2015-06-22 DIAGNOSIS — E785 Hyperlipidemia, unspecified: Secondary | ICD-10-CM | POA: Diagnosis not present

## 2015-06-22 DIAGNOSIS — D649 Anemia, unspecified: Secondary | ICD-10-CM | POA: Diagnosis not present

## 2015-06-22 DIAGNOSIS — Z79899 Other long term (current) drug therapy: Secondary | ICD-10-CM | POA: Diagnosis not present

## 2015-06-22 DIAGNOSIS — E1165 Type 2 diabetes mellitus with hyperglycemia: Secondary | ICD-10-CM | POA: Diagnosis not present

## 2015-06-22 DIAGNOSIS — E559 Vitamin D deficiency, unspecified: Secondary | ICD-10-CM | POA: Diagnosis not present

## 2015-06-22 DIAGNOSIS — E039 Hypothyroidism, unspecified: Secondary | ICD-10-CM | POA: Diagnosis not present

## 2015-06-22 DIAGNOSIS — E119 Type 2 diabetes mellitus without complications: Secondary | ICD-10-CM | POA: Diagnosis not present

## 2015-06-22 DIAGNOSIS — M6281 Muscle weakness (generalized): Secondary | ICD-10-CM | POA: Diagnosis not present

## 2015-06-23 DIAGNOSIS — E1165 Type 2 diabetes mellitus with hyperglycemia: Secondary | ICD-10-CM | POA: Diagnosis not present

## 2015-06-23 DIAGNOSIS — M6281 Muscle weakness (generalized): Secondary | ICD-10-CM | POA: Diagnosis not present

## 2015-06-25 DIAGNOSIS — E1165 Type 2 diabetes mellitus with hyperglycemia: Secondary | ICD-10-CM | POA: Diagnosis not present

## 2015-06-25 DIAGNOSIS — M6281 Muscle weakness (generalized): Secondary | ICD-10-CM | POA: Diagnosis not present

## 2015-06-26 DIAGNOSIS — E1165 Type 2 diabetes mellitus with hyperglycemia: Secondary | ICD-10-CM | POA: Diagnosis not present

## 2015-06-26 DIAGNOSIS — M6281 Muscle weakness (generalized): Secondary | ICD-10-CM | POA: Diagnosis not present

## 2015-06-29 DIAGNOSIS — E1165 Type 2 diabetes mellitus with hyperglycemia: Secondary | ICD-10-CM | POA: Diagnosis not present

## 2015-06-29 DIAGNOSIS — M6281 Muscle weakness (generalized): Secondary | ICD-10-CM | POA: Diagnosis not present

## 2015-06-30 DIAGNOSIS — M6281 Muscle weakness (generalized): Secondary | ICD-10-CM | POA: Diagnosis not present

## 2015-06-30 DIAGNOSIS — E1165 Type 2 diabetes mellitus with hyperglycemia: Secondary | ICD-10-CM | POA: Diagnosis not present

## 2015-07-01 DIAGNOSIS — M6281 Muscle weakness (generalized): Secondary | ICD-10-CM | POA: Diagnosis not present

## 2015-07-01 DIAGNOSIS — E1165 Type 2 diabetes mellitus with hyperglycemia: Secondary | ICD-10-CM | POA: Diagnosis not present

## 2015-07-02 DIAGNOSIS — E1165 Type 2 diabetes mellitus with hyperglycemia: Secondary | ICD-10-CM | POA: Diagnosis not present

## 2015-07-02 DIAGNOSIS — M6281 Muscle weakness (generalized): Secondary | ICD-10-CM | POA: Diagnosis not present

## 2015-07-03 DIAGNOSIS — M6281 Muscle weakness (generalized): Secondary | ICD-10-CM | POA: Diagnosis not present

## 2015-07-03 DIAGNOSIS — E1165 Type 2 diabetes mellitus with hyperglycemia: Secondary | ICD-10-CM | POA: Diagnosis not present

## 2015-07-07 DIAGNOSIS — M6281 Muscle weakness (generalized): Secondary | ICD-10-CM | POA: Diagnosis not present

## 2015-07-07 DIAGNOSIS — E1165 Type 2 diabetes mellitus with hyperglycemia: Secondary | ICD-10-CM | POA: Diagnosis not present

## 2015-07-08 DIAGNOSIS — M6281 Muscle weakness (generalized): Secondary | ICD-10-CM | POA: Diagnosis not present

## 2015-07-08 DIAGNOSIS — E1165 Type 2 diabetes mellitus with hyperglycemia: Secondary | ICD-10-CM | POA: Diagnosis not present

## 2015-07-09 DIAGNOSIS — M6281 Muscle weakness (generalized): Secondary | ICD-10-CM | POA: Diagnosis not present

## 2015-07-09 DIAGNOSIS — E1165 Type 2 diabetes mellitus with hyperglycemia: Secondary | ICD-10-CM | POA: Diagnosis not present

## 2015-07-10 DIAGNOSIS — E1165 Type 2 diabetes mellitus with hyperglycemia: Secondary | ICD-10-CM | POA: Diagnosis not present

## 2015-07-10 DIAGNOSIS — M6281 Muscle weakness (generalized): Secondary | ICD-10-CM | POA: Diagnosis not present

## 2015-07-13 DIAGNOSIS — E1165 Type 2 diabetes mellitus with hyperglycemia: Secondary | ICD-10-CM | POA: Diagnosis not present

## 2015-07-13 DIAGNOSIS — M6281 Muscle weakness (generalized): Secondary | ICD-10-CM | POA: Diagnosis not present

## 2015-07-14 DIAGNOSIS — E1165 Type 2 diabetes mellitus with hyperglycemia: Secondary | ICD-10-CM | POA: Diagnosis not present

## 2015-07-14 DIAGNOSIS — M6281 Muscle weakness (generalized): Secondary | ICD-10-CM | POA: Diagnosis not present

## 2015-07-15 DIAGNOSIS — E1165 Type 2 diabetes mellitus with hyperglycemia: Secondary | ICD-10-CM | POA: Diagnosis not present

## 2015-07-15 DIAGNOSIS — M6281 Muscle weakness (generalized): Secondary | ICD-10-CM | POA: Diagnosis not present

## 2015-07-16 DIAGNOSIS — R112 Nausea with vomiting, unspecified: Secondary | ICD-10-CM | POA: Diagnosis not present

## 2015-07-16 DIAGNOSIS — I10 Essential (primary) hypertension: Secondary | ICD-10-CM | POA: Diagnosis not present

## 2015-07-16 DIAGNOSIS — D649 Anemia, unspecified: Secondary | ICD-10-CM | POA: Diagnosis not present

## 2015-07-17 ENCOUNTER — Other Ambulatory Visit: Payer: Self-pay | Admitting: Endocrinology

## 2015-07-17 DIAGNOSIS — E870 Hyperosmolality and hypernatremia: Secondary | ICD-10-CM | POA: Diagnosis not present

## 2015-07-17 DIAGNOSIS — R112 Nausea with vomiting, unspecified: Secondary | ICD-10-CM

## 2015-07-17 DIAGNOSIS — E441 Mild protein-calorie malnutrition: Secondary | ICD-10-CM | POA: Diagnosis not present

## 2015-07-17 DIAGNOSIS — D649 Anemia, unspecified: Secondary | ICD-10-CM | POA: Diagnosis not present

## 2015-07-17 DIAGNOSIS — M6281 Muscle weakness (generalized): Secondary | ICD-10-CM | POA: Diagnosis not present

## 2015-07-17 DIAGNOSIS — E559 Vitamin D deficiency, unspecified: Secondary | ICD-10-CM | POA: Diagnosis not present

## 2015-07-17 DIAGNOSIS — F0390 Unspecified dementia without behavioral disturbance: Secondary | ICD-10-CM | POA: Diagnosis not present

## 2015-07-17 DIAGNOSIS — N182 Chronic kidney disease, stage 2 (mild): Secondary | ICD-10-CM | POA: Diagnosis not present

## 2015-07-17 DIAGNOSIS — K219 Gastro-esophageal reflux disease without esophagitis: Secondary | ICD-10-CM | POA: Diagnosis not present

## 2015-07-17 DIAGNOSIS — E1165 Type 2 diabetes mellitus with hyperglycemia: Secondary | ICD-10-CM | POA: Diagnosis not present

## 2015-07-17 DIAGNOSIS — K5909 Other constipation: Secondary | ICD-10-CM | POA: Diagnosis not present

## 2015-07-17 DIAGNOSIS — I10 Essential (primary) hypertension: Secondary | ICD-10-CM | POA: Diagnosis not present

## 2015-07-18 DIAGNOSIS — E039 Hypothyroidism, unspecified: Secondary | ICD-10-CM | POA: Diagnosis not present

## 2015-07-18 DIAGNOSIS — E559 Vitamin D deficiency, unspecified: Secondary | ICD-10-CM | POA: Diagnosis not present

## 2015-07-18 DIAGNOSIS — E785 Hyperlipidemia, unspecified: Secondary | ICD-10-CM | POA: Diagnosis not present

## 2015-07-18 DIAGNOSIS — D649 Anemia, unspecified: Secondary | ICD-10-CM | POA: Diagnosis not present

## 2015-07-18 DIAGNOSIS — E119 Type 2 diabetes mellitus without complications: Secondary | ICD-10-CM | POA: Diagnosis not present

## 2015-07-22 DIAGNOSIS — M6281 Muscle weakness (generalized): Secondary | ICD-10-CM | POA: Diagnosis not present

## 2015-07-22 DIAGNOSIS — E1165 Type 2 diabetes mellitus with hyperglycemia: Secondary | ICD-10-CM | POA: Diagnosis not present

## 2015-07-23 ENCOUNTER — Inpatient Hospital Stay: Admission: RE | Admit: 2015-07-23 | Payer: BC Managed Care – PPO | Source: Ambulatory Visit

## 2015-07-24 DIAGNOSIS — M6281 Muscle weakness (generalized): Secondary | ICD-10-CM | POA: Diagnosis not present

## 2015-07-24 DIAGNOSIS — E1165 Type 2 diabetes mellitus with hyperglycemia: Secondary | ICD-10-CM | POA: Diagnosis not present

## 2015-07-25 DIAGNOSIS — E1165 Type 2 diabetes mellitus with hyperglycemia: Secondary | ICD-10-CM | POA: Diagnosis not present

## 2015-07-25 DIAGNOSIS — M6281 Muscle weakness (generalized): Secondary | ICD-10-CM | POA: Diagnosis not present

## 2015-07-26 DIAGNOSIS — M6281 Muscle weakness (generalized): Secondary | ICD-10-CM | POA: Diagnosis not present

## 2015-07-26 DIAGNOSIS — E1165 Type 2 diabetes mellitus with hyperglycemia: Secondary | ICD-10-CM | POA: Diagnosis not present

## 2015-07-27 DIAGNOSIS — E1165 Type 2 diabetes mellitus with hyperglycemia: Secondary | ICD-10-CM | POA: Diagnosis not present

## 2015-07-27 DIAGNOSIS — M6281 Muscle weakness (generalized): Secondary | ICD-10-CM | POA: Diagnosis not present

## 2015-07-28 DIAGNOSIS — E1165 Type 2 diabetes mellitus with hyperglycemia: Secondary | ICD-10-CM | POA: Diagnosis not present

## 2015-07-28 DIAGNOSIS — M6281 Muscle weakness (generalized): Secondary | ICD-10-CM | POA: Diagnosis not present

## 2015-07-30 ENCOUNTER — Ambulatory Visit
Admission: RE | Admit: 2015-07-30 | Discharge: 2015-07-30 | Disposition: A | Discharge status: Home / Self Care | Payer: Medicare Other | Source: Ambulatory Visit | Attending: Endocrinology | Admitting: Endocrinology

## 2015-07-30 DIAGNOSIS — D3502 Benign neoplasm of left adrenal gland: Secondary | ICD-10-CM | POA: Diagnosis not present

## 2015-07-30 DIAGNOSIS — R112 Nausea with vomiting, unspecified: Secondary | ICD-10-CM

## 2015-07-30 MED ORDER — IOPAMIDOL (ISOVUE-300) INJECTION 61%
125.0000 mL | Freq: Once | INTRAVENOUS | Status: AC | PRN
  Administered 2015-07-30: 125 mL via INTRAVENOUS

## 2015-07-31 DIAGNOSIS — Z79899 Other long term (current) drug therapy: Secondary | ICD-10-CM | POA: Diagnosis not present

## 2015-07-31 DIAGNOSIS — E785 Hyperlipidemia, unspecified: Secondary | ICD-10-CM | POA: Diagnosis not present

## 2015-08-02 DIAGNOSIS — E1165 Type 2 diabetes mellitus with hyperglycemia: Secondary | ICD-10-CM | POA: Diagnosis not present

## 2015-08-02 DIAGNOSIS — M6281 Muscle weakness (generalized): Secondary | ICD-10-CM | POA: Diagnosis not present

## 2015-08-03 ENCOUNTER — Telehealth: Payer: Self-pay | Admitting: Gastroenterology

## 2015-08-03 DIAGNOSIS — M6281 Muscle weakness (generalized): Secondary | ICD-10-CM | POA: Diagnosis not present

## 2015-08-03 DIAGNOSIS — E1165 Type 2 diabetes mellitus with hyperglycemia: Secondary | ICD-10-CM | POA: Diagnosis not present

## 2015-08-03 NOTE — Telephone Encounter (Signed)
Caller name:  Jeani Hawking Relation to pt: [facility] - Dr Forde Dandy Call back number: [phone]   Reason for call:  Patient has had weeks of vomitting - Dr. Forde Dandy wants patient seen sooner than I can offer.

## 2015-08-03 NOTE — Telephone Encounter (Signed)
[name] with [facility] states the pt has been vomiting for 3 weeks and has been on a clear liquid diet, never seen a GI before.  Her daughter will be with her for appt and records to be faxed.

## 2015-08-04 DIAGNOSIS — M6281 Muscle weakness (generalized): Secondary | ICD-10-CM | POA: Diagnosis not present

## 2015-08-04 DIAGNOSIS — E1165 Type 2 diabetes mellitus with hyperglycemia: Secondary | ICD-10-CM | POA: Diagnosis not present

## 2015-08-05 DIAGNOSIS — M6281 Muscle weakness (generalized): Secondary | ICD-10-CM | POA: Diagnosis not present

## 2015-08-05 DIAGNOSIS — E1165 Type 2 diabetes mellitus with hyperglycemia: Secondary | ICD-10-CM | POA: Diagnosis not present

## 2015-08-06 DIAGNOSIS — M6281 Muscle weakness (generalized): Secondary | ICD-10-CM | POA: Diagnosis not present

## 2015-08-06 DIAGNOSIS — E1165 Type 2 diabetes mellitus with hyperglycemia: Secondary | ICD-10-CM | POA: Diagnosis not present

## 2015-08-07 ENCOUNTER — Encounter: Payer: Self-pay | Admitting: Gastroenterology

## 2015-08-07 ENCOUNTER — Ambulatory Visit (INDEPENDENT_AMBULATORY_CARE_PROVIDER_SITE_OTHER): Payer: Medicare Other | Admitting: Gastroenterology

## 2015-08-07 DIAGNOSIS — G8929 Other chronic pain: Secondary | ICD-10-CM

## 2015-08-07 DIAGNOSIS — G43A0 Cyclical vomiting, not intractable: Secondary | ICD-10-CM | POA: Diagnosis not present

## 2015-08-07 DIAGNOSIS — R1013 Epigastric pain: Secondary | ICD-10-CM

## 2015-08-07 NOTE — Patient Instructions (Signed)
You have been scheduled for an endoscopy. Please follow written instructions given to you at your visit today. If you use inhalers (even only as needed), please bring them with you on the day of your procedure.   

## 2015-08-08 DIAGNOSIS — M6281 Muscle weakness (generalized): Secondary | ICD-10-CM | POA: Diagnosis not present

## 2015-08-08 DIAGNOSIS — E1165 Type 2 diabetes mellitus with hyperglycemia: Secondary | ICD-10-CM | POA: Diagnosis not present

## 2015-08-10 ENCOUNTER — Encounter: Payer: Self-pay | Admitting: Gastroenterology

## 2015-08-10 ENCOUNTER — Ambulatory Visit (AMBULATORY_SURGERY_CENTER): Payer: Medicare Other | Admitting: Gastroenterology

## 2015-08-10 VITALS — BP 164/86 | HR 84 | Temp 98.4°F | Resp 14 | Ht 67.0 in | Wt 180.0 lb

## 2015-08-10 DIAGNOSIS — R1110 Vomiting, unspecified: Secondary | ICD-10-CM | POA: Diagnosis not present

## 2015-08-10 DIAGNOSIS — R1013 Epigastric pain: Secondary | ICD-10-CM | POA: Diagnosis not present

## 2015-08-10 DIAGNOSIS — K2990 Gastroduodenitis, unspecified, without bleeding: Secondary | ICD-10-CM

## 2015-08-10 DIAGNOSIS — E1165 Type 2 diabetes mellitus with hyperglycemia: Secondary | ICD-10-CM | POA: Diagnosis not present

## 2015-08-10 DIAGNOSIS — I10 Essential (primary) hypertension: Secondary | ICD-10-CM | POA: Diagnosis not present

## 2015-08-10 DIAGNOSIS — K2970 Gastritis, unspecified, without bleeding: Secondary | ICD-10-CM | POA: Diagnosis not present

## 2015-08-10 DIAGNOSIS — G8929 Other chronic pain: Secondary | ICD-10-CM | POA: Insufficient documentation

## 2015-08-10 DIAGNOSIS — R1111 Vomiting without nausea: Secondary | ICD-10-CM | POA: Diagnosis not present

## 2015-08-10 DIAGNOSIS — K2950 Unspecified chronic gastritis without bleeding: Secondary | ICD-10-CM | POA: Diagnosis not present

## 2015-08-10 DIAGNOSIS — M6281 Muscle weakness (generalized): Secondary | ICD-10-CM | POA: Diagnosis not present

## 2015-08-10 LAB — GLUCOSE, CAPILLARY
GLUCOSE-CAPILLARY: 85 mg/dL (ref 65–99)
Glucose-Capillary: 94 mg/dL (ref 65–99)

## 2015-08-10 MED ORDER — SODIUM CHLORIDE 0.9 % IV SOLN: 500.0000 mL | INTRAVENOUS | Status: DC

## 2015-08-10 NOTE — Patient Instructions (Signed)
Handout given: Gastritis.  Await scheduling of Gastric Emptying Study.  YOU HAD AN ENDOSCOPIC PROCEDURE TODAY AT [facility]:   Refer to the procedure report that was given to you for any specific questions about what was found during the examination.  If the procedure report does not answer your questions, please call your gastroenterologist to clarify.  If you requested that your care partner not be given the details of your procedure findings, then the procedure report has been included in a sealed envelope for you to review at your convenience later.  YOU SHOULD EXPECT: Some feelings of bloating in the abdomen. Passage of more gas than usual.  Walking can help get rid of the air that was put into your GI tract during the procedure and reduce the bloating. If you had a lower endoscopy (such as a colonoscopy or flexible sigmoidoscopy) you may notice spotting of blood in your stool or on the toilet paper. If you underwent a bowel prep for your procedure, you may not have a normal bowel movement for a few days.  Please Note:  You might notice some irritation and congestion in your nose or some drainage.  This is from the oxygen used during your procedure.  There is no need for concern and it should clear up in a day or so.  SYMPTOMS TO REPORT IMMEDIATELY:    Following upper endoscopy (EGD)  Vomiting of blood or coffee ground material  New chest pain or pain under the shoulder blades  Painful or persistently difficult swallowing  New shortness of breath  Fever of 100F or higher  Black, tarry-looking stools  For urgent or emergent issues, a gastroenterologist can be reached at any hour by calling [phone].   DIET: Your first meal following the procedure should be a small meal and then it is ok to progress to your normal diet. Heavy or fried foods are harder to digest and may make you feel nauseous or bloated.  Likewise, meals heavy in dairy and vegetables can increase  bloating.  Drink plenty of fluids but you should avoid alcoholic beverages for 24 hours.  ACTIVITY:  You should plan to take it easy for the rest of today and you should NOT DRIVE or use heavy machinery until tomorrow (because of the sedation medicines used during the test).    FOLLOW UP: Our staff will call the number listed on your records the next business day following your procedure to check on you and address any questions or concerns that you may have regarding the information given to you following your procedure. If we do not reach you, we will leave a message.  However, if you are feeling well and you are not experiencing any problems, there is no need to return our call.  We will assume that you have returned to your regular daily activities without incident.  If any biopsies were taken you will be contacted by phone or by letter within the next 1-3 weeks.  Please call us at [phone] if you have not heard about the biopsies in 3 weeks.    SIGNATURES/CONFIDENTIALITY: You and/or your care partner have signed paperwork which will be entered into your electronic medical record.  These signatures attest to the fact that that the information above on your After Visit Summary has been reviewed and is understood.  Full responsibility of the confidentiality of this discharge information lies with you and/or your care-partner.

## 2015-08-10 NOTE — Progress Notes (Signed)
Thank you for sending this case to me. I have reviewed the entire note, and the outlined plan seems appropriate.   I just completed the EGD, which was negative.   Please order a Gastric emptying study.  Last Hgb A1C > 9

## 2015-08-10 NOTE — Progress Notes (Signed)
Called to room to assist during endoscopic procedure.  Patient ID and intended procedure confirmed with present staff. Received instructions for my participation in the procedure from the performing physician.  

## 2015-08-10 NOTE — Progress Notes (Signed)
08/10/2015 [name] [id number] 1940-06-21   HISTORY OF PRESENT ILLNESS:  This is 75 year old female who is new to our practice and presents to our office today at the request of her PCP, Dr. Forde Dandy. She is a resident at [facility] and he has assumed her care while she is there. She is here today with her daughter who provides history as well.  They tell me that she has had intermittent episodes of projectile vomiting, mostly postprandial that is associated with upper/epigastric abdominal pain for the past 5 years. She says that it usually happens a couple of times per year, but has been occurring more frequently recently. Her most recent/current episode has lasted 3 weeks. They say that when she feels the urge to vomit that it comes almost immediately and is projectile. They say that no matter what she eats or drinks when she vomits is always a pink-colored solid looking material resembling Pepto-Bismol. She denies any dysphasia. She had a CT scan of the abdomen with contrast to check for "obstruction", which showed gallstones versus gallbladder sludge but no other abnormalities to account for her symptoms.  CMP and CBC were relatively unremarkable. Amylase slightly elevated at 124, but no lipase was performed. Currently she's been on a clear liquid diet and has been feeling somewhat better. She is asking if she can try to advance her diet again because she is starving. She's never had GI evaluation. She has long-standing diabetic on insulin.  She is already on omeprazole 40 mg daily and has Zofran to be used as needed.  Past Medical History  Diagnosis Date  . Anxiety   . Dyspnea   . Diabetes mellitus   . Osteoarthritis   . Depression   . RLS (restless legs syndrome)   . Stress   . PONV (postoperative nausea and vomiting)   . Headache(784.0)   . HTN (hypertension)     dr Johnsie Cancel  . GERD (gastroesophageal reflux disease)   . Hypercholesteremia     DENIES   Past  Surgical History  Procedure Laterality Date  . Colonoscopy    . Abdominal hysterectomy    . Lumbar laminectomy/decompression microdiscectomy N/A 04/18/2012    Procedure: LUMBAR LAMINECTOMY/DECOMPRESSION MICRODISCECTOMY 3 LEVELS;  Surgeon: Winfield Cunas, MD;  Location: [facility];  Service: Neurosurgery;  Laterality: N/A;  Lumbar three-four, lumbar four-five, lumbar five-sacral one decompression. Synovial cyst resection lumbar four-five  . Left forearm fracture with orif Left   . Replacement total knee Right   . Wisdom tooth extraction      reports that she has never smoked. She has never used smokeless tobacco. She reports that she does not drink alcohol or use illicit drugs. family history includes Diabetes in her father; High blood pressure in her father and mother. There is no history of Colon cancer, Esophageal cancer, Liver disease, Stomach cancer, or Pancreatic cancer. Allergies  Allergen Reactions  . Morphine And Related Other (See Comments)    hallucination   . Oxycodone Other (See Comments)    hallucination   . Sulfonamide Derivatives Swelling      Outpatient Encounter Prescriptions as of 08/07/2015  Medication Sig  . acetaminophen (TYLENOL) 650 MG CR tablet Take 650 mg by mouth 2 (two) times daily.  Marland Kitchen ALPRAZolam (XANAX) 0.5 MG tablet Take 1 tablet (0.5 mg total) by mouth 2 (two) times daily as needed for anxiety.  Marland Kitchen amLODipine (NORVASC) 5 MG tablet Take 1 tablet (5 mg total) by mouth daily.  Marland Kitchen  aspirin 81 MG chewable tablet Chew 81 mg by mouth daily.  . Cranberry-Vitamin C-Probiotic (AZO CRANBERRY) 250-30 MG TABS Take 1 tablet by mouth daily.  . ergocalciferol (VITAMIN D2) 50000 UNITS capsule Take 50,000 Units by mouth once a week.  . insulin aspart (NOVOLOG) 100 UNIT/ML injection Inject 0-9 Units into the skin 3 (three) times daily with meals. CBG < 70: implement hypoglycemia protocol CBG 70 - 120: 0 units CBG 121 - 150: 1 unit CBG 151 - 200: 2 units CBG 201 - 250: 3  units CBG 251 - 300: 5 units CBG 301 - 350: 7 units CBG 351 - 400: 9 units CBG > 400: call MD  . insulin detemir (LEVEMIR) 100 UNIT/ML injection Inject 0.05 mLs (5 Units total) into the skin at bedtime. (Patient taking differently: Inject 10 Units into the skin daily. )  . loratadine (CLARITIN) 10 MG tablet Take 10 mg by mouth daily as needed for allergies.   Marland Kitchen losartan (COZAAR) 100 MG tablet Take 100 mg by mouth daily.  . Magnesium Oxide 500 MG TABS Take 500 mg by mouth daily.   . Maltodextrin-Xanthan Gum (RESOURCE THICKENUP CLEAR) POWD Take 120 g by mouth as needed. (Patient not taking: Reported on 08/10/2015)  . metoprolol succinate (TOPROL-XL) 50 MG 24 hr tablet Take 1 tablet (50 mg total) by mouth daily.  Marland Kitchen omeprazole (PRILOSEC) 40 MG capsule Take 40 mg by mouth daily.  . ondansetron (ZOFRAN) 4 MG tablet Take 4 mg by mouth every 8 (eight) hours as needed for nausea or vomiting.   . polyethylene glycol (MIRALAX / GLYCOLAX) packet Take 17 g by mouth daily.  . potassium chloride SA (K-DUR,KLOR-CON) 20 MEQ tablet Take 1 tablet (20 mEq total) by mouth daily.  . pramipexole (MIRAPEX) 0.25 MG tablet Take 1.5 tablets by mouth three times daily  . [DISCONTINUED] acetaminophen (TYLENOL) 500 MG tablet Take 500 mg by mouth every 4 (four) hours as needed for mild pain, fever or headache (99.5 to 101).   . [DISCONTINUED] B Complex Vitamins (B-COMPLEX/B-12 PO) Take 1 tablet by mouth daily.   . [DISCONTINUED] bisacodyl (DULCOLAX) 10 MG suppository Place 10 mg rectally as needed for moderate constipation.  . [DISCONTINUED] cholecalciferol (VITAMIN D) 1000 UNITS tablet Take 1,000 Units by mouth daily.  . [DISCONTINUED] feeding supplement, ENSURE ENLIVE, (ENSURE ENLIVE) LIQD Take 237 mLs by mouth 2 (two) times daily between meals. (Patient not taking: Reported on 07/09/2014)  . [DISCONTINUED] levofloxacin (LEVAQUIN) 25 MG/ML solution Take 20 mLs (500 mg total) by mouth daily.  . [DISCONTINUED] loperamide  (IMODIUM) 2 MG capsule Take 2 mg by mouth as needed for diarrhea or loose stools. Do not exceed 8 doses in 24 hours  . [DISCONTINUED] mirtazapine (REMERON) 7.5 MG tablet Take 1 tablet (7.5 mg total) by mouth at bedtime.  . [DISCONTINUED] pramipexole (MIRAPEX) 0.125 MG tablet Take 3 tablets (0.375 mg total) by mouth 3 (three) times daily.  . [DISCONTINUED] senna-docusate (SENOKOT-S) 8.6-50 MG per tablet Take 1 tablet by mouth at bedtime as needed for mild constipation.   No facility-administered encounter medications on file as of 08/07/2015.     REVIEW OF SYSTEMS  : All other systems reviewed and negative except where noted in the History of Present Illness.   PHYSICAL EXAM: Ht   Wt  General: Well developed black female in no acute distress; in wheelchair Head: Normocephalic and atraumatic Eyes:  Sclerae anicteric, conjunctiva pink. Ears: Normal auditory acuity Lungs: Clear throughout to auscultation Heart: Regular rate and  rhythm Abdomen: Soft, non-distended.  Normal bowel sounds.  Non-tender. Musculoskeletal: Symmetrical with no gross deformities  Skin: No lesions on visible extremities Extremities: No edema  Neurological: Alert oriented x 4, grossly non-focal Psychological:  Alert and cooperative. Normal mood and affect  ASSESSMENT AND PLAN: -75 year old female with complaints of intermittent episodes of projectile vomiting sleep postprandial for the last 5 years associated with epigastric abdominal pain. Current episode present for 3 weeks. Emesis contains what they describe as pink colored solid material that looks like Pepto-Bismol. Recent CT scan shows no cause for her symptoms.  She is long-standing diabetic on insulin.  I think that she deserves endoscopic evaluation to rule out ulcer disease, etc.  If negative then probably need to consider GES vs further GB evaluation.  In the interim I am going to change her Zofran to scheduled dosing 4 mg 3 times a day daily before meals  instead of when necessary.  She is asking if she can try to increase her diet from clear liquids; advised to try soft/low-fat/low fiber diet and eat small frequent meals.  Continue daily PPI.  The risks, benefits, and alternatives to EGD were discussed with the patient and her daughter; they consent to proceed.  -IDDM:  Insulin will be adjusted prior to endoscopic procedure per protocol. Will resume normal dosing after procedure.  CC:  [name], MD

## 2015-08-10 NOTE — Progress Notes (Signed)
Report given to PACU RN, vss 

## 2015-08-10 NOTE — Op Note (Addendum)
[facility] Patient Name: [name] Procedure Date: 08/10/2015 2:57 PM MRN: [id number] Endoscopist: Mallie Mussel L. Loletha Carrow , MD Age: 75 Referring MD:  Date of Birth: 1940-05-02 Gender: Female Account #: [id number] Procedure:                Upper GI endoscopy Indications:              Vomiting Medicines:                Monitored Anesthesia Care Procedure:                Pre-Anesthesia Assessment:                           - Prior to the procedure, a History and Physical                            was performed, and patient medications and                            allergies were reviewed. The patient's tolerance of                            previous anesthesia was also reviewed. The risks                            and benefits of the procedure and the sedation                            options and risks were discussed with the patient.                            All questions were answered, and informed consent                            was obtained. Prior Anticoagulants: The patient has                            taken no previous anticoagulant or antiplatelet                            agents. ASA Grade Assessment: III - A patient with                            severe systemic disease. After reviewing the risks                            and benefits, the patient was deemed in                            satisfactory condition to undergo the procedure.                           After obtaining informed consent, the endoscope was  passed under direct vision. Throughout the                            procedure, the patient's blood pressure, pulse, and                            oxygen saturations were monitored continuously. The                            Model GIF-[id number]) scope was introduced                            through the mouth, and advanced to the second part                            of duodenum. The upper GI endoscopy was                             accomplished without difficulty. The patient                            tolerated the procedure well. Scope In: Scope Out: Findings:                 The esophagus was normal.                           Scattered erosions were found in the gastric                            antrum. Biopsies were taken with a cold forceps for                            histology to rule out H. pylori.                           The cardia and gastric fundus were normal on                            retroflexion.                           The examined duodenum was normal. Complications:            No immediate complications. Estimated Blood Loss:     Estimated blood loss: none. Impression:               - Normal esophagus.                           - Gastritis.( Biopsied.) Appears doubtful to be the                            cause of symptoms.                           - Normal examined duodenum. Recommendation:           -  Patient has a contact number available for                            emergencies. The signs and symptoms of potential                            delayed complications were discussed with the                            patient. Return to normal activities tomorrow.                            Written discharge instructions were provided to the                            patient.                           - Resume previous diet.                           - Continue present medications.                           - Await pathology results.                           - Gastric emptying study Henry L. Loletha Carrow, MD 08/10/2015 3:29:40 PM This report has been signed electronically.

## 2015-08-11 ENCOUNTER — Telehealth: Payer: Self-pay

## 2015-08-11 DIAGNOSIS — E1165 Type 2 diabetes mellitus with hyperglycemia: Secondary | ICD-10-CM | POA: Diagnosis not present

## 2015-08-11 DIAGNOSIS — M6281 Muscle weakness (generalized): Secondary | ICD-10-CM | POA: Diagnosis not present

## 2015-08-11 NOTE — Telephone Encounter (Signed)
Attempt post procedure follow up call, no answer, left voice mail message.  

## 2015-08-12 DIAGNOSIS — E1165 Type 2 diabetes mellitus with hyperglycemia: Secondary | ICD-10-CM | POA: Diagnosis not present

## 2015-08-12 DIAGNOSIS — M6281 Muscle weakness (generalized): Secondary | ICD-10-CM | POA: Diagnosis not present

## 2015-08-13 DIAGNOSIS — E1165 Type 2 diabetes mellitus with hyperglycemia: Secondary | ICD-10-CM | POA: Diagnosis not present

## 2015-08-13 DIAGNOSIS — M6281 Muscle weakness (generalized): Secondary | ICD-10-CM | POA: Diagnosis not present

## 2015-08-14 DIAGNOSIS — M6281 Muscle weakness (generalized): Secondary | ICD-10-CM | POA: Diagnosis not present

## 2015-08-14 DIAGNOSIS — E1165 Type 2 diabetes mellitus with hyperglycemia: Secondary | ICD-10-CM | POA: Diagnosis not present

## 2015-08-17 ENCOUNTER — Other Ambulatory Visit: Payer: Self-pay

## 2015-08-17 DIAGNOSIS — E1165 Type 2 diabetes mellitus with hyperglycemia: Secondary | ICD-10-CM | POA: Diagnosis not present

## 2015-08-17 DIAGNOSIS — R112 Nausea with vomiting, unspecified: Secondary | ICD-10-CM

## 2015-08-17 DIAGNOSIS — M6281 Muscle weakness (generalized): Secondary | ICD-10-CM | POA: Diagnosis not present

## 2015-08-18 DIAGNOSIS — E1165 Type 2 diabetes mellitus with hyperglycemia: Secondary | ICD-10-CM | POA: Diagnosis not present

## 2015-08-18 DIAGNOSIS — M6281 Muscle weakness (generalized): Secondary | ICD-10-CM | POA: Diagnosis not present

## 2015-08-19 DIAGNOSIS — M6281 Muscle weakness (generalized): Secondary | ICD-10-CM | POA: Diagnosis not present

## 2015-08-19 DIAGNOSIS — E1165 Type 2 diabetes mellitus with hyperglycemia: Secondary | ICD-10-CM | POA: Diagnosis not present

## 2015-08-20 DIAGNOSIS — E1165 Type 2 diabetes mellitus with hyperglycemia: Secondary | ICD-10-CM | POA: Diagnosis not present

## 2015-08-20 DIAGNOSIS — M6281 Muscle weakness (generalized): Secondary | ICD-10-CM | POA: Diagnosis not present

## 2015-08-21 DIAGNOSIS — M6281 Muscle weakness (generalized): Secondary | ICD-10-CM | POA: Diagnosis not present

## 2015-08-21 DIAGNOSIS — E1165 Type 2 diabetes mellitus with hyperglycemia: Secondary | ICD-10-CM | POA: Diagnosis not present

## 2015-08-24 DIAGNOSIS — E1165 Type 2 diabetes mellitus with hyperglycemia: Secondary | ICD-10-CM | POA: Diagnosis not present

## 2015-08-24 DIAGNOSIS — M6281 Muscle weakness (generalized): Secondary | ICD-10-CM | POA: Diagnosis not present

## 2015-08-24 DIAGNOSIS — E119 Type 2 diabetes mellitus without complications: Secondary | ICD-10-CM | POA: Diagnosis not present

## 2015-08-25 DIAGNOSIS — M6281 Muscle weakness (generalized): Secondary | ICD-10-CM | POA: Diagnosis not present

## 2015-08-25 DIAGNOSIS — E1165 Type 2 diabetes mellitus with hyperglycemia: Secondary | ICD-10-CM | POA: Diagnosis not present

## 2015-08-26 DIAGNOSIS — E1165 Type 2 diabetes mellitus with hyperglycemia: Secondary | ICD-10-CM | POA: Diagnosis not present

## 2015-08-26 DIAGNOSIS — M6281 Muscle weakness (generalized): Secondary | ICD-10-CM | POA: Diagnosis not present

## 2015-08-31 DIAGNOSIS — E1165 Type 2 diabetes mellitus with hyperglycemia: Secondary | ICD-10-CM | POA: Diagnosis not present

## 2015-08-31 DIAGNOSIS — M6281 Muscle weakness (generalized): Secondary | ICD-10-CM | POA: Diagnosis not present

## 2015-09-01 DIAGNOSIS — E1165 Type 2 diabetes mellitus with hyperglycemia: Secondary | ICD-10-CM | POA: Diagnosis not present

## 2015-09-01 DIAGNOSIS — M6281 Muscle weakness (generalized): Secondary | ICD-10-CM | POA: Diagnosis not present

## 2015-09-02 ENCOUNTER — Telehealth: Payer: Self-pay | Admitting: Gastroenterology

## 2015-09-02 DIAGNOSIS — M6281 Muscle weakness (generalized): Secondary | ICD-10-CM | POA: Diagnosis not present

## 2015-09-02 DIAGNOSIS — E1165 Type 2 diabetes mellitus with hyperglycemia: Secondary | ICD-10-CM | POA: Diagnosis not present

## 2015-09-02 NOTE — Telephone Encounter (Signed)
FYI Dr Loletha Carrow Appt for GES has been cancelled, pt will call back to reschdule

## 2015-09-03 ENCOUNTER — Ambulatory Visit (HOSPITAL_COMMUNITY): Payer: Medicare Other

## 2015-09-03 DIAGNOSIS — M6281 Muscle weakness (generalized): Secondary | ICD-10-CM | POA: Diagnosis not present

## 2015-09-03 DIAGNOSIS — E1165 Type 2 diabetes mellitus with hyperglycemia: Secondary | ICD-10-CM | POA: Diagnosis not present

## 2015-09-04 DIAGNOSIS — E1165 Type 2 diabetes mellitus with hyperglycemia: Secondary | ICD-10-CM | POA: Diagnosis not present

## 2015-09-04 DIAGNOSIS — M6281 Muscle weakness (generalized): Secondary | ICD-10-CM | POA: Diagnosis not present

## 2015-09-07 DIAGNOSIS — M6281 Muscle weakness (generalized): Secondary | ICD-10-CM | POA: Diagnosis not present

## 2015-09-07 DIAGNOSIS — E1165 Type 2 diabetes mellitus with hyperglycemia: Secondary | ICD-10-CM | POA: Diagnosis not present

## 2015-09-08 DIAGNOSIS — E1165 Type 2 diabetes mellitus with hyperglycemia: Secondary | ICD-10-CM | POA: Diagnosis not present

## 2015-09-08 DIAGNOSIS — M6281 Muscle weakness (generalized): Secondary | ICD-10-CM | POA: Diagnosis not present

## 2015-09-09 DIAGNOSIS — E1165 Type 2 diabetes mellitus with hyperglycemia: Secondary | ICD-10-CM | POA: Diagnosis not present

## 2015-09-09 DIAGNOSIS — M6281 Muscle weakness (generalized): Secondary | ICD-10-CM | POA: Diagnosis not present

## 2015-09-10 DIAGNOSIS — E1165 Type 2 diabetes mellitus with hyperglycemia: Secondary | ICD-10-CM | POA: Diagnosis not present

## 2015-09-10 DIAGNOSIS — M6281 Muscle weakness (generalized): Secondary | ICD-10-CM | POA: Diagnosis not present

## 2015-09-10 DIAGNOSIS — F4323 Adjustment disorder with mixed anxiety and depressed mood: Secondary | ICD-10-CM | POA: Diagnosis not present

## 2015-09-11 DIAGNOSIS — M6281 Muscle weakness (generalized): Secondary | ICD-10-CM | POA: Diagnosis not present

## 2015-09-11 DIAGNOSIS — E1165 Type 2 diabetes mellitus with hyperglycemia: Secondary | ICD-10-CM | POA: Diagnosis not present

## 2015-09-14 DIAGNOSIS — E1165 Type 2 diabetes mellitus with hyperglycemia: Secondary | ICD-10-CM | POA: Diagnosis not present

## 2015-09-14 DIAGNOSIS — M6281 Muscle weakness (generalized): Secondary | ICD-10-CM | POA: Diagnosis not present

## 2015-09-24 DIAGNOSIS — K219 Gastro-esophageal reflux disease without esophagitis: Secondary | ICD-10-CM | POA: Diagnosis not present

## 2015-09-24 DIAGNOSIS — F0390 Unspecified dementia without behavioral disturbance: Secondary | ICD-10-CM | POA: Diagnosis not present

## 2015-09-24 DIAGNOSIS — E441 Mild protein-calorie malnutrition: Secondary | ICD-10-CM | POA: Diagnosis not present

## 2015-09-24 DIAGNOSIS — R112 Nausea with vomiting, unspecified: Secondary | ICD-10-CM | POA: Diagnosis not present

## 2015-09-24 DIAGNOSIS — E870 Hyperosmolality and hypernatremia: Secondary | ICD-10-CM | POA: Diagnosis not present

## 2015-09-24 DIAGNOSIS — N182 Chronic kidney disease, stage 2 (mild): Secondary | ICD-10-CM | POA: Diagnosis not present

## 2015-09-24 DIAGNOSIS — E559 Vitamin D deficiency, unspecified: Secondary | ICD-10-CM | POA: Diagnosis not present

## 2015-09-24 DIAGNOSIS — D649 Anemia, unspecified: Secondary | ICD-10-CM | POA: Diagnosis not present

## 2015-09-24 DIAGNOSIS — I10 Essential (primary) hypertension: Secondary | ICD-10-CM | POA: Diagnosis not present

## 2015-09-25 DIAGNOSIS — M6281 Muscle weakness (generalized): Secondary | ICD-10-CM | POA: Diagnosis not present

## 2015-09-25 DIAGNOSIS — R2689 Other abnormalities of gait and mobility: Secondary | ICD-10-CM | POA: Diagnosis not present

## 2015-09-25 DIAGNOSIS — R1312 Dysphagia, oropharyngeal phase: Secondary | ICD-10-CM | POA: Diagnosis not present

## 2015-09-28 DIAGNOSIS — E119 Type 2 diabetes mellitus without complications: Secondary | ICD-10-CM | POA: Diagnosis not present

## 2015-09-28 DIAGNOSIS — Z79899 Other long term (current) drug therapy: Secondary | ICD-10-CM | POA: Diagnosis not present

## 2015-10-01 ENCOUNTER — Ambulatory Visit: Payer: BC Managed Care – PPO | Admitting: Gastroenterology

## 2015-10-06 DIAGNOSIS — F4323 Adjustment disorder with mixed anxiety and depressed mood: Secondary | ICD-10-CM | POA: Diagnosis not present

## 2015-10-12 DIAGNOSIS — E119 Type 2 diabetes mellitus without complications: Secondary | ICD-10-CM | POA: Diagnosis not present

## 2015-10-12 DIAGNOSIS — F0390 Unspecified dementia without behavioral disturbance: Secondary | ICD-10-CM | POA: Diagnosis not present

## 2015-10-12 DIAGNOSIS — R1110 Vomiting, unspecified: Secondary | ICD-10-CM | POA: Diagnosis not present

## 2015-10-12 DIAGNOSIS — I10 Essential (primary) hypertension: Secondary | ICD-10-CM | POA: Diagnosis not present

## 2015-10-13 ENCOUNTER — Other Ambulatory Visit (HOSPITAL_COMMUNITY): Payer: Self-pay | Admitting: Endocrinology

## 2015-10-13 DIAGNOSIS — K3184 Gastroparesis: Secondary | ICD-10-CM

## 2015-10-15 DIAGNOSIS — R1312 Dysphagia, oropharyngeal phase: Secondary | ICD-10-CM | POA: Diagnosis not present

## 2015-10-15 DIAGNOSIS — M6281 Muscle weakness (generalized): Secondary | ICD-10-CM | POA: Diagnosis not present

## 2015-10-15 DIAGNOSIS — R2689 Other abnormalities of gait and mobility: Secondary | ICD-10-CM | POA: Diagnosis not present

## 2015-10-16 DIAGNOSIS — R2689 Other abnormalities of gait and mobility: Secondary | ICD-10-CM | POA: Diagnosis not present

## 2015-10-16 DIAGNOSIS — M6281 Muscle weakness (generalized): Secondary | ICD-10-CM | POA: Diagnosis not present

## 2015-10-16 DIAGNOSIS — R1312 Dysphagia, oropharyngeal phase: Secondary | ICD-10-CM | POA: Diagnosis not present

## 2015-10-19 DIAGNOSIS — M6281 Muscle weakness (generalized): Secondary | ICD-10-CM | POA: Diagnosis not present

## 2015-10-19 DIAGNOSIS — R1312 Dysphagia, oropharyngeal phase: Secondary | ICD-10-CM | POA: Diagnosis not present

## 2015-10-19 DIAGNOSIS — R2689 Other abnormalities of gait and mobility: Secondary | ICD-10-CM | POA: Diagnosis not present

## 2015-10-20 DIAGNOSIS — R2689 Other abnormalities of gait and mobility: Secondary | ICD-10-CM | POA: Diagnosis not present

## 2015-10-20 DIAGNOSIS — M6281 Muscle weakness (generalized): Secondary | ICD-10-CM | POA: Diagnosis not present

## 2015-10-20 DIAGNOSIS — R1312 Dysphagia, oropharyngeal phase: Secondary | ICD-10-CM | POA: Diagnosis not present

## 2015-10-21 DIAGNOSIS — R1312 Dysphagia, oropharyngeal phase: Secondary | ICD-10-CM | POA: Diagnosis not present

## 2015-10-21 DIAGNOSIS — M6281 Muscle weakness (generalized): Secondary | ICD-10-CM | POA: Diagnosis not present

## 2015-10-21 DIAGNOSIS — R2689 Other abnormalities of gait and mobility: Secondary | ICD-10-CM | POA: Diagnosis not present

## 2015-10-22 ENCOUNTER — Ambulatory Visit (HOSPITAL_COMMUNITY): Admission: RE | Admit: 2015-10-22 | Payer: Medicare Other | Source: Ambulatory Visit

## 2015-10-22 DIAGNOSIS — R2689 Other abnormalities of gait and mobility: Secondary | ICD-10-CM | POA: Diagnosis not present

## 2015-10-22 DIAGNOSIS — E119 Type 2 diabetes mellitus without complications: Secondary | ICD-10-CM | POA: Diagnosis not present

## 2015-10-22 DIAGNOSIS — R05 Cough: Secondary | ICD-10-CM | POA: Diagnosis not present

## 2015-10-22 DIAGNOSIS — F4323 Adjustment disorder with mixed anxiety and depressed mood: Secondary | ICD-10-CM | POA: Diagnosis not present

## 2015-10-22 DIAGNOSIS — R1312 Dysphagia, oropharyngeal phase: Secondary | ICD-10-CM | POA: Diagnosis not present

## 2015-10-22 DIAGNOSIS — R1110 Vomiting, unspecified: Secondary | ICD-10-CM | POA: Diagnosis not present

## 2015-10-22 DIAGNOSIS — M6281 Muscle weakness (generalized): Secondary | ICD-10-CM | POA: Diagnosis not present

## 2015-10-22 DIAGNOSIS — I10 Essential (primary) hypertension: Secondary | ICD-10-CM | POA: Diagnosis not present

## 2015-10-23 DIAGNOSIS — D649 Anemia, unspecified: Secondary | ICD-10-CM | POA: Diagnosis not present

## 2015-10-24 DIAGNOSIS — R2689 Other abnormalities of gait and mobility: Secondary | ICD-10-CM | POA: Diagnosis not present

## 2015-10-24 DIAGNOSIS — R1312 Dysphagia, oropharyngeal phase: Secondary | ICD-10-CM | POA: Diagnosis not present

## 2015-10-26 DIAGNOSIS — R2689 Other abnormalities of gait and mobility: Secondary | ICD-10-CM | POA: Diagnosis not present

## 2015-10-26 DIAGNOSIS — R1312 Dysphagia, oropharyngeal phase: Secondary | ICD-10-CM | POA: Diagnosis not present

## 2015-10-27 DIAGNOSIS — R1312 Dysphagia, oropharyngeal phase: Secondary | ICD-10-CM | POA: Diagnosis not present

## 2015-10-27 DIAGNOSIS — R2689 Other abnormalities of gait and mobility: Secondary | ICD-10-CM | POA: Diagnosis not present

## 2015-10-28 DIAGNOSIS — R2689 Other abnormalities of gait and mobility: Secondary | ICD-10-CM | POA: Diagnosis not present

## 2015-10-28 DIAGNOSIS — R1312 Dysphagia, oropharyngeal phase: Secondary | ICD-10-CM | POA: Diagnosis not present

## 2015-10-29 DIAGNOSIS — R2689 Other abnormalities of gait and mobility: Secondary | ICD-10-CM | POA: Diagnosis not present

## 2015-10-29 DIAGNOSIS — R1312 Dysphagia, oropharyngeal phase: Secondary | ICD-10-CM | POA: Diagnosis not present

## 2015-10-30 DIAGNOSIS — R2689 Other abnormalities of gait and mobility: Secondary | ICD-10-CM | POA: Diagnosis not present

## 2015-10-30 DIAGNOSIS — R1312 Dysphagia, oropharyngeal phase: Secondary | ICD-10-CM | POA: Diagnosis not present

## 2015-11-02 DIAGNOSIS — R1312 Dysphagia, oropharyngeal phase: Secondary | ICD-10-CM | POA: Diagnosis not present

## 2015-11-02 DIAGNOSIS — R2689 Other abnormalities of gait and mobility: Secondary | ICD-10-CM | POA: Diagnosis not present

## 2015-11-03 DIAGNOSIS — R1312 Dysphagia, oropharyngeal phase: Secondary | ICD-10-CM | POA: Diagnosis not present

## 2015-11-03 DIAGNOSIS — R2689 Other abnormalities of gait and mobility: Secondary | ICD-10-CM | POA: Diagnosis not present

## 2015-11-09 DIAGNOSIS — E119 Type 2 diabetes mellitus without complications: Secondary | ICD-10-CM | POA: Diagnosis not present

## 2015-11-09 DIAGNOSIS — I10 Essential (primary) hypertension: Secondary | ICD-10-CM | POA: Diagnosis not present

## 2015-11-09 DIAGNOSIS — R1110 Vomiting, unspecified: Secondary | ICD-10-CM | POA: Diagnosis not present

## 2015-11-09 DIAGNOSIS — G2581 Restless legs syndrome: Secondary | ICD-10-CM | POA: Diagnosis not present

## 2015-11-17 DIAGNOSIS — H25013 Cortical age-related cataract, bilateral: Secondary | ICD-10-CM | POA: Diagnosis not present

## 2015-11-17 DIAGNOSIS — H524 Presbyopia: Secondary | ICD-10-CM | POA: Diagnosis not present

## 2015-11-17 DIAGNOSIS — H2513 Age-related nuclear cataract, bilateral: Secondary | ICD-10-CM | POA: Diagnosis not present

## 2015-11-25 DIAGNOSIS — G2581 Restless legs syndrome: Secondary | ICD-10-CM | POA: Diagnosis not present

## 2015-11-25 DIAGNOSIS — I10 Essential (primary) hypertension: Secondary | ICD-10-CM | POA: Diagnosis not present

## 2015-11-25 DIAGNOSIS — E119 Type 2 diabetes mellitus without complications: Secondary | ICD-10-CM | POA: Diagnosis not present

## 2015-11-25 DIAGNOSIS — H25013 Cortical age-related cataract, bilateral: Secondary | ICD-10-CM | POA: Diagnosis not present

## 2015-11-25 DIAGNOSIS — H2513 Age-related nuclear cataract, bilateral: Secondary | ICD-10-CM | POA: Diagnosis not present

## 2015-11-25 DIAGNOSIS — E113292 Type 2 diabetes mellitus with mild nonproliferative diabetic retinopathy without macular edema, left eye: Secondary | ICD-10-CM | POA: Diagnosis not present

## 2015-11-25 DIAGNOSIS — F0390 Unspecified dementia without behavioral disturbance: Secondary | ICD-10-CM | POA: Diagnosis not present

## 2015-12-02 DIAGNOSIS — R2689 Other abnormalities of gait and mobility: Secondary | ICD-10-CM | POA: Diagnosis not present

## 2015-12-02 DIAGNOSIS — R319 Hematuria, unspecified: Secondary | ICD-10-CM | POA: Diagnosis not present

## 2015-12-02 DIAGNOSIS — N390 Urinary tract infection, site not specified: Secondary | ICD-10-CM | POA: Diagnosis not present

## 2015-12-02 DIAGNOSIS — M6281 Muscle weakness (generalized): Secondary | ICD-10-CM | POA: Diagnosis not present

## 2015-12-03 DIAGNOSIS — F4323 Adjustment disorder with mixed anxiety and depressed mood: Secondary | ICD-10-CM | POA: Diagnosis not present

## 2015-12-03 DIAGNOSIS — R2689 Other abnormalities of gait and mobility: Secondary | ICD-10-CM | POA: Diagnosis not present

## 2015-12-03 DIAGNOSIS — M6281 Muscle weakness (generalized): Secondary | ICD-10-CM | POA: Diagnosis not present

## 2015-12-04 DIAGNOSIS — R2689 Other abnormalities of gait and mobility: Secondary | ICD-10-CM | POA: Diagnosis not present

## 2015-12-04 DIAGNOSIS — M6281 Muscle weakness (generalized): Secondary | ICD-10-CM | POA: Diagnosis not present

## 2015-12-07 DIAGNOSIS — R2689 Other abnormalities of gait and mobility: Secondary | ICD-10-CM | POA: Diagnosis not present

## 2015-12-07 DIAGNOSIS — M6281 Muscle weakness (generalized): Secondary | ICD-10-CM | POA: Diagnosis not present

## 2015-12-08 DIAGNOSIS — M6281 Muscle weakness (generalized): Secondary | ICD-10-CM | POA: Diagnosis not present

## 2015-12-08 DIAGNOSIS — R2689 Other abnormalities of gait and mobility: Secondary | ICD-10-CM | POA: Diagnosis not present

## 2015-12-09 DIAGNOSIS — M6281 Muscle weakness (generalized): Secondary | ICD-10-CM | POA: Diagnosis not present

## 2015-12-09 DIAGNOSIS — R2689 Other abnormalities of gait and mobility: Secondary | ICD-10-CM | POA: Diagnosis not present

## 2015-12-10 DIAGNOSIS — D6489 Other specified anemias: Secondary | ICD-10-CM | POA: Diagnosis not present

## 2015-12-10 DIAGNOSIS — R110 Nausea: Secondary | ICD-10-CM | POA: Diagnosis not present

## 2015-12-10 DIAGNOSIS — I10 Essential (primary) hypertension: Secondary | ICD-10-CM | POA: Diagnosis not present

## 2015-12-10 DIAGNOSIS — E870 Hyperosmolality and hypernatremia: Secondary | ICD-10-CM | POA: Diagnosis not present

## 2015-12-10 DIAGNOSIS — E559 Vitamin D deficiency, unspecified: Secondary | ICD-10-CM | POA: Diagnosis not present

## 2015-12-10 DIAGNOSIS — K219 Gastro-esophageal reflux disease without esophagitis: Secondary | ICD-10-CM | POA: Diagnosis not present

## 2015-12-10 DIAGNOSIS — R1110 Vomiting, unspecified: Secondary | ICD-10-CM | POA: Diagnosis not present

## 2015-12-10 DIAGNOSIS — E1129 Type 2 diabetes mellitus with other diabetic kidney complication: Secondary | ICD-10-CM | POA: Diagnosis not present

## 2015-12-10 DIAGNOSIS — M6281 Muscle weakness (generalized): Secondary | ICD-10-CM | POA: Diagnosis not present

## 2015-12-10 DIAGNOSIS — E440 Moderate protein-calorie malnutrition: Secondary | ICD-10-CM | POA: Diagnosis not present

## 2015-12-10 DIAGNOSIS — N182 Chronic kidney disease, stage 2 (mild): Secondary | ICD-10-CM | POA: Diagnosis not present

## 2015-12-10 DIAGNOSIS — F0390 Unspecified dementia without behavioral disturbance: Secondary | ICD-10-CM | POA: Diagnosis not present

## 2015-12-10 DIAGNOSIS — R2689 Other abnormalities of gait and mobility: Secondary | ICD-10-CM | POA: Diagnosis not present

## 2015-12-10 DIAGNOSIS — K5909 Other constipation: Secondary | ICD-10-CM | POA: Diagnosis not present

## 2015-12-11 DIAGNOSIS — R2689 Other abnormalities of gait and mobility: Secondary | ICD-10-CM | POA: Diagnosis not present

## 2015-12-11 DIAGNOSIS — M6281 Muscle weakness (generalized): Secondary | ICD-10-CM | POA: Diagnosis not present

## 2015-12-14 DIAGNOSIS — M6281 Muscle weakness (generalized): Secondary | ICD-10-CM | POA: Diagnosis not present

## 2015-12-14 DIAGNOSIS — R2689 Other abnormalities of gait and mobility: Secondary | ICD-10-CM | POA: Diagnosis not present

## 2015-12-15 DIAGNOSIS — R2689 Other abnormalities of gait and mobility: Secondary | ICD-10-CM | POA: Diagnosis not present

## 2015-12-15 DIAGNOSIS — M6281 Muscle weakness (generalized): Secondary | ICD-10-CM | POA: Diagnosis not present

## 2015-12-16 DIAGNOSIS — R2689 Other abnormalities of gait and mobility: Secondary | ICD-10-CM | POA: Diagnosis not present

## 2015-12-16 DIAGNOSIS — M6281 Muscle weakness (generalized): Secondary | ICD-10-CM | POA: Diagnosis not present

## 2015-12-17 DIAGNOSIS — M6281 Muscle weakness (generalized): Secondary | ICD-10-CM | POA: Diagnosis not present

## 2015-12-17 DIAGNOSIS — R2689 Other abnormalities of gait and mobility: Secondary | ICD-10-CM | POA: Diagnosis not present

## 2015-12-18 DIAGNOSIS — R2689 Other abnormalities of gait and mobility: Secondary | ICD-10-CM | POA: Diagnosis not present

## 2015-12-18 DIAGNOSIS — M6281 Muscle weakness (generalized): Secondary | ICD-10-CM | POA: Diagnosis not present

## 2015-12-21 DIAGNOSIS — M6281 Muscle weakness (generalized): Secondary | ICD-10-CM | POA: Diagnosis not present

## 2015-12-21 DIAGNOSIS — R2689 Other abnormalities of gait and mobility: Secondary | ICD-10-CM | POA: Diagnosis not present

## 2015-12-24 DIAGNOSIS — H25012 Cortical age-related cataract, left eye: Secondary | ICD-10-CM | POA: Diagnosis not present

## 2015-12-24 DIAGNOSIS — H25812 Combined forms of age-related cataract, left eye: Secondary | ICD-10-CM | POA: Diagnosis not present

## 2015-12-24 DIAGNOSIS — H2512 Age-related nuclear cataract, left eye: Secondary | ICD-10-CM | POA: Diagnosis not present

## 2015-12-25 DIAGNOSIS — Z79899 Other long term (current) drug therapy: Secondary | ICD-10-CM | POA: Diagnosis not present

## 2015-12-25 DIAGNOSIS — E119 Type 2 diabetes mellitus without complications: Secondary | ICD-10-CM | POA: Diagnosis not present

## 2015-12-25 DIAGNOSIS — E785 Hyperlipidemia, unspecified: Secondary | ICD-10-CM | POA: Diagnosis not present

## 2015-12-25 DIAGNOSIS — D649 Anemia, unspecified: Secondary | ICD-10-CM | POA: Diagnosis not present

## 2015-12-25 DIAGNOSIS — E039 Hypothyroidism, unspecified: Secondary | ICD-10-CM | POA: Diagnosis not present

## 2015-12-25 DIAGNOSIS — E559 Vitamin D deficiency, unspecified: Secondary | ICD-10-CM | POA: Diagnosis not present

## 2016-01-07 DIAGNOSIS — H25011 Cortical age-related cataract, right eye: Secondary | ICD-10-CM | POA: Diagnosis not present

## 2016-01-07 DIAGNOSIS — H25811 Combined forms of age-related cataract, right eye: Secondary | ICD-10-CM | POA: Diagnosis not present

## 2016-01-07 DIAGNOSIS — H2511 Age-related nuclear cataract, right eye: Secondary | ICD-10-CM | POA: Diagnosis not present

## 2016-02-05 DIAGNOSIS — E119 Type 2 diabetes mellitus without complications: Secondary | ICD-10-CM | POA: Diagnosis not present

## 2016-02-05 DIAGNOSIS — F0390 Unspecified dementia without behavioral disturbance: Secondary | ICD-10-CM | POA: Diagnosis not present

## 2016-02-05 DIAGNOSIS — I10 Essential (primary) hypertension: Secondary | ICD-10-CM | POA: Diagnosis not present

## 2016-02-05 DIAGNOSIS — R1110 Vomiting, unspecified: Secondary | ICD-10-CM | POA: Diagnosis not present

## 2016-02-08 DIAGNOSIS — K219 Gastro-esophageal reflux disease without esophagitis: Secondary | ICD-10-CM | POA: Diagnosis not present

## 2016-02-08 DIAGNOSIS — E871 Hypo-osmolality and hyponatremia: Secondary | ICD-10-CM | POA: Diagnosis not present

## 2016-02-08 DIAGNOSIS — I10 Essential (primary) hypertension: Secondary | ICD-10-CM | POA: Diagnosis not present

## 2016-02-08 DIAGNOSIS — E1129 Type 2 diabetes mellitus with other diabetic kidney complication: Secondary | ICD-10-CM | POA: Diagnosis not present

## 2016-02-08 DIAGNOSIS — E559 Vitamin D deficiency, unspecified: Secondary | ICD-10-CM | POA: Diagnosis not present

## 2016-02-08 DIAGNOSIS — R112 Nausea with vomiting, unspecified: Secondary | ICD-10-CM | POA: Diagnosis not present

## 2016-02-08 DIAGNOSIS — N182 Chronic kidney disease, stage 2 (mild): Secondary | ICD-10-CM | POA: Diagnosis not present

## 2016-02-08 DIAGNOSIS — E441 Mild protein-calorie malnutrition: Secondary | ICD-10-CM | POA: Diagnosis not present

## 2016-02-08 DIAGNOSIS — K5909 Other constipation: Secondary | ICD-10-CM | POA: Diagnosis not present

## 2016-02-08 DIAGNOSIS — F0280 Dementia in other diseases classified elsewhere without behavioral disturbance: Secondary | ICD-10-CM | POA: Diagnosis not present

## 2016-03-08 DIAGNOSIS — F4323 Adjustment disorder with mixed anxiety and depressed mood: Secondary | ICD-10-CM | POA: Diagnosis not present

## 2016-03-11 DIAGNOSIS — R0781 Pleurodynia: Secondary | ICD-10-CM | POA: Diagnosis not present

## 2016-03-11 DIAGNOSIS — R000 Tachycardia, unspecified: Secondary | ICD-10-CM | POA: Diagnosis not present

## 2016-03-25 DIAGNOSIS — F338 Other recurrent depressive disorders: Secondary | ICD-10-CM | POA: Diagnosis not present

## 2016-03-25 DIAGNOSIS — E119 Type 2 diabetes mellitus without complications: Secondary | ICD-10-CM | POA: Diagnosis not present

## 2016-03-25 DIAGNOSIS — F419 Anxiety disorder, unspecified: Secondary | ICD-10-CM | POA: Diagnosis not present

## 2016-04-04 DIAGNOSIS — I10 Essential (primary) hypertension: Secondary | ICD-10-CM | POA: Diagnosis not present

## 2016-04-04 DIAGNOSIS — R21 Rash and other nonspecific skin eruption: Secondary | ICD-10-CM | POA: Diagnosis not present

## 2016-04-04 DIAGNOSIS — E119 Type 2 diabetes mellitus without complications: Secondary | ICD-10-CM | POA: Diagnosis not present

## 2016-04-04 DIAGNOSIS — F0390 Unspecified dementia without behavioral disturbance: Secondary | ICD-10-CM | POA: Diagnosis not present

## 2016-04-05 DIAGNOSIS — F4323 Adjustment disorder with mixed anxiety and depressed mood: Secondary | ICD-10-CM | POA: Diagnosis not present

## 2016-04-13 DIAGNOSIS — Z66 Do not resuscitate: Secondary | ICD-10-CM | POA: Diagnosis not present

## 2016-04-13 DIAGNOSIS — I10 Essential (primary) hypertension: Secondary | ICD-10-CM | POA: Diagnosis not present

## 2016-04-13 DIAGNOSIS — F0390 Unspecified dementia without behavioral disturbance: Secondary | ICD-10-CM | POA: Diagnosis not present

## 2016-04-19 DIAGNOSIS — F4323 Adjustment disorder with mixed anxiety and depressed mood: Secondary | ICD-10-CM | POA: Diagnosis not present

## 2016-04-20 DIAGNOSIS — M6281 Muscle weakness (generalized): Secondary | ICD-10-CM | POA: Diagnosis not present

## 2016-04-20 DIAGNOSIS — R278 Other lack of coordination: Secondary | ICD-10-CM | POA: Diagnosis not present

## 2016-04-20 DIAGNOSIS — R1312 Dysphagia, oropharyngeal phase: Secondary | ICD-10-CM | POA: Diagnosis not present

## 2016-04-20 DIAGNOSIS — R262 Difficulty in walking, not elsewhere classified: Secondary | ICD-10-CM | POA: Diagnosis not present

## 2016-04-21 DIAGNOSIS — R262 Difficulty in walking, not elsewhere classified: Secondary | ICD-10-CM | POA: Diagnosis not present

## 2016-04-21 DIAGNOSIS — R278 Other lack of coordination: Secondary | ICD-10-CM | POA: Diagnosis not present

## 2016-04-21 DIAGNOSIS — M6281 Muscle weakness (generalized): Secondary | ICD-10-CM | POA: Diagnosis not present

## 2016-04-22 DIAGNOSIS — M6281 Muscle weakness (generalized): Secondary | ICD-10-CM | POA: Diagnosis not present

## 2016-04-22 DIAGNOSIS — R278 Other lack of coordination: Secondary | ICD-10-CM | POA: Diagnosis not present

## 2016-04-22 DIAGNOSIS — R262 Difficulty in walking, not elsewhere classified: Secondary | ICD-10-CM | POA: Diagnosis not present

## 2016-04-25 DIAGNOSIS — R262 Difficulty in walking, not elsewhere classified: Secondary | ICD-10-CM | POA: Diagnosis not present

## 2016-04-25 DIAGNOSIS — R278 Other lack of coordination: Secondary | ICD-10-CM | POA: Diagnosis not present

## 2016-04-25 DIAGNOSIS — M6281 Muscle weakness (generalized): Secondary | ICD-10-CM | POA: Diagnosis not present

## 2016-04-26 DIAGNOSIS — R278 Other lack of coordination: Secondary | ICD-10-CM | POA: Diagnosis not present

## 2016-04-26 DIAGNOSIS — M6281 Muscle weakness (generalized): Secondary | ICD-10-CM | POA: Diagnosis not present

## 2016-04-26 DIAGNOSIS — R262 Difficulty in walking, not elsewhere classified: Secondary | ICD-10-CM | POA: Diagnosis not present

## 2016-04-27 DIAGNOSIS — G2581 Restless legs syndrome: Secondary | ICD-10-CM | POA: Diagnosis not present

## 2016-04-27 DIAGNOSIS — R278 Other lack of coordination: Secondary | ICD-10-CM | POA: Diagnosis not present

## 2016-04-27 DIAGNOSIS — F0390 Unspecified dementia without behavioral disturbance: Secondary | ICD-10-CM | POA: Diagnosis not present

## 2016-04-27 DIAGNOSIS — E119 Type 2 diabetes mellitus without complications: Secondary | ICD-10-CM | POA: Diagnosis not present

## 2016-04-27 DIAGNOSIS — I10 Essential (primary) hypertension: Secondary | ICD-10-CM | POA: Diagnosis not present

## 2016-04-27 DIAGNOSIS — R262 Difficulty in walking, not elsewhere classified: Secondary | ICD-10-CM | POA: Diagnosis not present

## 2016-04-27 DIAGNOSIS — M6281 Muscle weakness (generalized): Secondary | ICD-10-CM | POA: Diagnosis not present

## 2016-04-28 DIAGNOSIS — R262 Difficulty in walking, not elsewhere classified: Secondary | ICD-10-CM | POA: Diagnosis not present

## 2016-04-28 DIAGNOSIS — M6281 Muscle weakness (generalized): Secondary | ICD-10-CM | POA: Diagnosis not present

## 2016-04-28 DIAGNOSIS — R278 Other lack of coordination: Secondary | ICD-10-CM | POA: Diagnosis not present

## 2016-04-29 DIAGNOSIS — R278 Other lack of coordination: Secondary | ICD-10-CM | POA: Diagnosis not present

## 2016-04-29 DIAGNOSIS — E119 Type 2 diabetes mellitus without complications: Secondary | ICD-10-CM | POA: Diagnosis not present

## 2016-04-29 DIAGNOSIS — F0390 Unspecified dementia without behavioral disturbance: Secondary | ICD-10-CM | POA: Diagnosis not present

## 2016-04-29 DIAGNOSIS — R21 Rash and other nonspecific skin eruption: Secondary | ICD-10-CM | POA: Diagnosis not present

## 2016-04-29 DIAGNOSIS — M6281 Muscle weakness (generalized): Secondary | ICD-10-CM | POA: Diagnosis not present

## 2016-04-29 DIAGNOSIS — I10 Essential (primary) hypertension: Secondary | ICD-10-CM | POA: Diagnosis not present

## 2016-04-29 DIAGNOSIS — R262 Difficulty in walking, not elsewhere classified: Secondary | ICD-10-CM | POA: Diagnosis not present

## 2016-05-02 DIAGNOSIS — M6281 Muscle weakness (generalized): Secondary | ICD-10-CM | POA: Diagnosis not present

## 2016-05-02 DIAGNOSIS — R278 Other lack of coordination: Secondary | ICD-10-CM | POA: Diagnosis not present

## 2016-05-02 DIAGNOSIS — R262 Difficulty in walking, not elsewhere classified: Secondary | ICD-10-CM | POA: Diagnosis not present

## 2016-05-03 DIAGNOSIS — M6281 Muscle weakness (generalized): Secondary | ICD-10-CM | POA: Diagnosis not present

## 2016-05-03 DIAGNOSIS — R262 Difficulty in walking, not elsewhere classified: Secondary | ICD-10-CM | POA: Diagnosis not present

## 2016-05-03 DIAGNOSIS — R278 Other lack of coordination: Secondary | ICD-10-CM | POA: Diagnosis not present

## 2016-05-04 DIAGNOSIS — R262 Difficulty in walking, not elsewhere classified: Secondary | ICD-10-CM | POA: Diagnosis not present

## 2016-05-04 DIAGNOSIS — R278 Other lack of coordination: Secondary | ICD-10-CM | POA: Diagnosis not present

## 2016-05-04 DIAGNOSIS — M6281 Muscle weakness (generalized): Secondary | ICD-10-CM | POA: Diagnosis not present

## 2016-05-05 DIAGNOSIS — R262 Difficulty in walking, not elsewhere classified: Secondary | ICD-10-CM | POA: Diagnosis not present

## 2016-05-05 DIAGNOSIS — M6281 Muscle weakness (generalized): Secondary | ICD-10-CM | POA: Diagnosis not present

## 2016-05-05 DIAGNOSIS — R278 Other lack of coordination: Secondary | ICD-10-CM | POA: Diagnosis not present

## 2016-05-06 DIAGNOSIS — R278 Other lack of coordination: Secondary | ICD-10-CM | POA: Diagnosis not present

## 2016-05-06 DIAGNOSIS — M6281 Muscle weakness (generalized): Secondary | ICD-10-CM | POA: Diagnosis not present

## 2016-05-06 DIAGNOSIS — R262 Difficulty in walking, not elsewhere classified: Secondary | ICD-10-CM | POA: Diagnosis not present

## 2016-05-09 DIAGNOSIS — R278 Other lack of coordination: Secondary | ICD-10-CM | POA: Diagnosis not present

## 2016-05-09 DIAGNOSIS — M6281 Muscle weakness (generalized): Secondary | ICD-10-CM | POA: Diagnosis not present

## 2016-05-09 DIAGNOSIS — R262 Difficulty in walking, not elsewhere classified: Secondary | ICD-10-CM | POA: Diagnosis not present

## 2016-05-10 DIAGNOSIS — R278 Other lack of coordination: Secondary | ICD-10-CM | POA: Diagnosis not present

## 2016-05-10 DIAGNOSIS — M6281 Muscle weakness (generalized): Secondary | ICD-10-CM | POA: Diagnosis not present

## 2016-05-10 DIAGNOSIS — R262 Difficulty in walking, not elsewhere classified: Secondary | ICD-10-CM | POA: Diagnosis not present

## 2016-05-11 DIAGNOSIS — M6281 Muscle weakness (generalized): Secondary | ICD-10-CM | POA: Diagnosis not present

## 2016-05-11 DIAGNOSIS — R278 Other lack of coordination: Secondary | ICD-10-CM | POA: Diagnosis not present

## 2016-05-11 DIAGNOSIS — R262 Difficulty in walking, not elsewhere classified: Secondary | ICD-10-CM | POA: Diagnosis not present

## 2016-05-12 DIAGNOSIS — R278 Other lack of coordination: Secondary | ICD-10-CM | POA: Diagnosis not present

## 2016-05-12 DIAGNOSIS — M6281 Muscle weakness (generalized): Secondary | ICD-10-CM | POA: Diagnosis not present

## 2016-05-12 DIAGNOSIS — R262 Difficulty in walking, not elsewhere classified: Secondary | ICD-10-CM | POA: Diagnosis not present

## 2016-05-13 DIAGNOSIS — R262 Difficulty in walking, not elsewhere classified: Secondary | ICD-10-CM | POA: Diagnosis not present

## 2016-05-13 DIAGNOSIS — M6281 Muscle weakness (generalized): Secondary | ICD-10-CM | POA: Diagnosis not present

## 2016-05-13 DIAGNOSIS — R278 Other lack of coordination: Secondary | ICD-10-CM | POA: Diagnosis not present

## 2016-05-16 DIAGNOSIS — R278 Other lack of coordination: Secondary | ICD-10-CM | POA: Diagnosis not present

## 2016-05-16 DIAGNOSIS — R262 Difficulty in walking, not elsewhere classified: Secondary | ICD-10-CM | POA: Diagnosis not present

## 2016-05-16 DIAGNOSIS — M6281 Muscle weakness (generalized): Secondary | ICD-10-CM | POA: Diagnosis not present

## 2016-05-17 DIAGNOSIS — F4323 Adjustment disorder with mixed anxiety and depressed mood: Secondary | ICD-10-CM | POA: Diagnosis not present

## 2016-05-17 DIAGNOSIS — R262 Difficulty in walking, not elsewhere classified: Secondary | ICD-10-CM | POA: Diagnosis not present

## 2016-05-17 DIAGNOSIS — G2581 Restless legs syndrome: Secondary | ICD-10-CM | POA: Diagnosis not present

## 2016-05-17 DIAGNOSIS — I10 Essential (primary) hypertension: Secondary | ICD-10-CM | POA: Diagnosis not present

## 2016-05-17 DIAGNOSIS — M6281 Muscle weakness (generalized): Secondary | ICD-10-CM | POA: Diagnosis not present

## 2016-05-17 DIAGNOSIS — R278 Other lack of coordination: Secondary | ICD-10-CM | POA: Diagnosis not present

## 2016-05-17 DIAGNOSIS — F0390 Unspecified dementia without behavioral disturbance: Secondary | ICD-10-CM | POA: Diagnosis not present

## 2016-05-17 DIAGNOSIS — E119 Type 2 diabetes mellitus without complications: Secondary | ICD-10-CM | POA: Diagnosis not present

## 2016-05-20 DIAGNOSIS — R278 Other lack of coordination: Secondary | ICD-10-CM | POA: Diagnosis not present

## 2016-05-20 DIAGNOSIS — M6281 Muscle weakness (generalized): Secondary | ICD-10-CM | POA: Diagnosis not present

## 2016-05-20 DIAGNOSIS — R262 Difficulty in walking, not elsewhere classified: Secondary | ICD-10-CM | POA: Diagnosis not present

## 2016-05-21 DIAGNOSIS — M6281 Muscle weakness (generalized): Secondary | ICD-10-CM | POA: Diagnosis not present

## 2016-05-21 DIAGNOSIS — R278 Other lack of coordination: Secondary | ICD-10-CM | POA: Diagnosis not present

## 2016-05-21 DIAGNOSIS — R262 Difficulty in walking, not elsewhere classified: Secondary | ICD-10-CM | POA: Diagnosis not present

## 2016-05-22 DIAGNOSIS — R262 Difficulty in walking, not elsewhere classified: Secondary | ICD-10-CM | POA: Diagnosis not present

## 2016-05-22 DIAGNOSIS — R278 Other lack of coordination: Secondary | ICD-10-CM | POA: Diagnosis not present

## 2016-05-22 DIAGNOSIS — M6281 Muscle weakness (generalized): Secondary | ICD-10-CM | POA: Diagnosis not present

## 2016-05-23 DIAGNOSIS — R278 Other lack of coordination: Secondary | ICD-10-CM | POA: Diagnosis not present

## 2016-05-23 DIAGNOSIS — R262 Difficulty in walking, not elsewhere classified: Secondary | ICD-10-CM | POA: Diagnosis not present

## 2016-05-23 DIAGNOSIS — M6281 Muscle weakness (generalized): Secondary | ICD-10-CM | POA: Diagnosis not present

## 2016-05-24 DIAGNOSIS — M6281 Muscle weakness (generalized): Secondary | ICD-10-CM | POA: Diagnosis not present

## 2016-05-24 DIAGNOSIS — R278 Other lack of coordination: Secondary | ICD-10-CM | POA: Diagnosis not present

## 2016-05-24 DIAGNOSIS — R262 Difficulty in walking, not elsewhere classified: Secondary | ICD-10-CM | POA: Diagnosis not present

## 2016-05-25 DIAGNOSIS — R278 Other lack of coordination: Secondary | ICD-10-CM | POA: Diagnosis not present

## 2016-05-25 DIAGNOSIS — M6281 Muscle weakness (generalized): Secondary | ICD-10-CM | POA: Diagnosis not present

## 2016-05-25 DIAGNOSIS — R262 Difficulty in walking, not elsewhere classified: Secondary | ICD-10-CM | POA: Diagnosis not present

## 2016-05-26 DIAGNOSIS — R278 Other lack of coordination: Secondary | ICD-10-CM | POA: Diagnosis not present

## 2016-05-26 DIAGNOSIS — M6281 Muscle weakness (generalized): Secondary | ICD-10-CM | POA: Diagnosis not present

## 2016-05-26 DIAGNOSIS — R262 Difficulty in walking, not elsewhere classified: Secondary | ICD-10-CM | POA: Diagnosis not present

## 2016-05-31 DIAGNOSIS — R05 Cough: Secondary | ICD-10-CM | POA: Diagnosis not present

## 2016-05-31 DIAGNOSIS — F0390 Unspecified dementia without behavioral disturbance: Secondary | ICD-10-CM | POA: Diagnosis not present

## 2016-05-31 DIAGNOSIS — R1110 Vomiting, unspecified: Secondary | ICD-10-CM | POA: Diagnosis not present

## 2016-05-31 DIAGNOSIS — E119 Type 2 diabetes mellitus without complications: Secondary | ICD-10-CM | POA: Diagnosis not present

## 2016-06-07 DIAGNOSIS — Z79899 Other long term (current) drug therapy: Secondary | ICD-10-CM | POA: Diagnosis not present

## 2016-06-07 DIAGNOSIS — R319 Hematuria, unspecified: Secondary | ICD-10-CM | POA: Diagnosis not present

## 2016-06-07 DIAGNOSIS — N390 Urinary tract infection, site not specified: Secondary | ICD-10-CM | POA: Diagnosis not present

## 2016-06-08 DIAGNOSIS — N182 Chronic kidney disease, stage 2 (mild): Secondary | ICD-10-CM | POA: Diagnosis not present

## 2016-06-08 DIAGNOSIS — E119 Type 2 diabetes mellitus without complications: Secondary | ICD-10-CM | POA: Diagnosis not present

## 2016-06-08 DIAGNOSIS — I10 Essential (primary) hypertension: Secondary | ICD-10-CM | POA: Diagnosis not present

## 2016-06-08 DIAGNOSIS — F0390 Unspecified dementia without behavioral disturbance: Secondary | ICD-10-CM | POA: Diagnosis not present

## 2016-06-09 DIAGNOSIS — F4323 Adjustment disorder with mixed anxiety and depressed mood: Secondary | ICD-10-CM | POA: Diagnosis not present

## 2016-06-15 DIAGNOSIS — I10 Essential (primary) hypertension: Secondary | ICD-10-CM | POA: Diagnosis not present

## 2016-06-15 DIAGNOSIS — E119 Type 2 diabetes mellitus without complications: Secondary | ICD-10-CM | POA: Diagnosis not present

## 2016-06-15 DIAGNOSIS — R21 Rash and other nonspecific skin eruption: Secondary | ICD-10-CM | POA: Diagnosis not present

## 2016-06-15 DIAGNOSIS — F0390 Unspecified dementia without behavioral disturbance: Secondary | ICD-10-CM | POA: Diagnosis not present

## 2016-06-22 DIAGNOSIS — D649 Anemia, unspecified: Secondary | ICD-10-CM | POA: Diagnosis not present

## 2016-06-22 DIAGNOSIS — Z79899 Other long term (current) drug therapy: Secondary | ICD-10-CM | POA: Diagnosis not present

## 2016-06-22 DIAGNOSIS — E559 Vitamin D deficiency, unspecified: Secondary | ICD-10-CM | POA: Diagnosis not present

## 2016-06-22 DIAGNOSIS — E785 Hyperlipidemia, unspecified: Secondary | ICD-10-CM | POA: Diagnosis not present

## 2016-06-22 DIAGNOSIS — E119 Type 2 diabetes mellitus without complications: Secondary | ICD-10-CM | POA: Diagnosis not present

## 2016-06-22 DIAGNOSIS — E039 Hypothyroidism, unspecified: Secondary | ICD-10-CM | POA: Diagnosis not present

## 2016-07-04 ENCOUNTER — Encounter (HOSPITAL_COMMUNITY): Payer: Self-pay | Admitting: Emergency Medicine

## 2016-07-04 ENCOUNTER — Inpatient Hospital Stay (HOSPITAL_COMMUNITY)
Admission: EM | Admit: 2016-07-04 | Discharge: 2016-07-08 | DRG: 482 | Disposition: A | Discharge status: Skilled Nursing Facility | Payer: Medicare Other | Attending: Orthopedic Surgery | Admitting: Orthopedic Surgery

## 2016-07-04 ENCOUNTER — Emergency Department (HOSPITAL_COMMUNITY): Payer: Medicare Other

## 2016-07-04 DIAGNOSIS — E1165 Type 2 diabetes mellitus with hyperglycemia: Secondary | ICD-10-CM | POA: Diagnosis not present

## 2016-07-04 DIAGNOSIS — G2581 Restless legs syndrome: Secondary | ICD-10-CM | POA: Diagnosis present

## 2016-07-04 DIAGNOSIS — T148XXA Other injury of unspecified body region, initial encounter: Secondary | ICD-10-CM | POA: Diagnosis not present

## 2016-07-04 DIAGNOSIS — R41841 Cognitive communication deficit: Secondary | ICD-10-CM | POA: Diagnosis not present

## 2016-07-04 DIAGNOSIS — J302 Other seasonal allergic rhinitis: Secondary | ICD-10-CM | POA: Diagnosis not present

## 2016-07-04 DIAGNOSIS — S72001A Fracture of unspecified part of neck of right femur, initial encounter for closed fracture: Secondary | ICD-10-CM | POA: Diagnosis not present

## 2016-07-04 DIAGNOSIS — F0390 Unspecified dementia without behavioral disturbance: Secondary | ICD-10-CM | POA: Diagnosis not present

## 2016-07-04 DIAGNOSIS — R52 Pain, unspecified: Secondary | ICD-10-CM | POA: Diagnosis not present

## 2016-07-04 DIAGNOSIS — I10 Essential (primary) hypertension: Secondary | ICD-10-CM | POA: Diagnosis present

## 2016-07-04 DIAGNOSIS — E7800 Pure hypercholesterolemia, unspecified: Secondary | ICD-10-CM | POA: Diagnosis not present

## 2016-07-04 DIAGNOSIS — W07XXXA Fall from chair, initial encounter: Secondary | ICD-10-CM | POA: Diagnosis present

## 2016-07-04 DIAGNOSIS — Z888 Allergy status to other drugs, medicaments and biological substances status: Secondary | ICD-10-CM | POA: Diagnosis not present

## 2016-07-04 DIAGNOSIS — E119 Type 2 diabetes mellitus without complications: Secondary | ICD-10-CM | POA: Diagnosis present

## 2016-07-04 DIAGNOSIS — Z6832 Body mass index (BMI) 32.0-32.9, adult: Secondary | ICD-10-CM

## 2016-07-04 DIAGNOSIS — Z4789 Encounter for other orthopedic aftercare: Secondary | ICD-10-CM | POA: Diagnosis not present

## 2016-07-04 DIAGNOSIS — E559 Vitamin D deficiency, unspecified: Secondary | ICD-10-CM | POA: Diagnosis not present

## 2016-07-04 DIAGNOSIS — R339 Retention of urine, unspecified: Secondary | ICD-10-CM | POA: Diagnosis not present

## 2016-07-04 DIAGNOSIS — K219 Gastro-esophageal reflux disease without esophagitis: Secondary | ICD-10-CM | POA: Diagnosis present

## 2016-07-04 DIAGNOSIS — F418 Other specified anxiety disorders: Secondary | ICD-10-CM | POA: Diagnosis not present

## 2016-07-04 DIAGNOSIS — M9711XA 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, initial encounter: Principal | ICD-10-CM

## 2016-07-04 DIAGNOSIS — M25569 Pain in unspecified knee: Secondary | ICD-10-CM | POA: Diagnosis not present

## 2016-07-04 DIAGNOSIS — K3184 Gastroparesis: Secondary | ICD-10-CM | POA: Diagnosis not present

## 2016-07-04 DIAGNOSIS — M25561 Pain in right knee: Secondary | ICD-10-CM | POA: Diagnosis not present

## 2016-07-04 DIAGNOSIS — Z882 Allergy status to sulfonamides status: Secondary | ICD-10-CM | POA: Diagnosis not present

## 2016-07-04 DIAGNOSIS — F419 Anxiety disorder, unspecified: Secondary | ICD-10-CM | POA: Diagnosis not present

## 2016-07-04 DIAGNOSIS — F329 Major depressive disorder, single episode, unspecified: Secondary | ICD-10-CM | POA: Diagnosis present

## 2016-07-04 DIAGNOSIS — R262 Difficulty in walking, not elsewhere classified: Secondary | ICD-10-CM | POA: Diagnosis not present

## 2016-07-04 DIAGNOSIS — Z419 Encounter for procedure for purposes other than remedying health state, unspecified: Secondary | ICD-10-CM

## 2016-07-04 DIAGNOSIS — Z794 Long term (current) use of insulin: Secondary | ICD-10-CM | POA: Diagnosis not present

## 2016-07-04 DIAGNOSIS — S72391A Other fracture of shaft of right femur, initial encounter for closed fracture: Secondary | ICD-10-CM | POA: Diagnosis not present

## 2016-07-04 DIAGNOSIS — G9340 Encephalopathy, unspecified: Secondary | ICD-10-CM | POA: Diagnosis not present

## 2016-07-04 DIAGNOSIS — R278 Other lack of coordination: Secondary | ICD-10-CM | POA: Diagnosis not present

## 2016-07-04 DIAGNOSIS — S72351A Displaced comminuted fracture of shaft of right femur, initial encounter for closed fracture: Secondary | ICD-10-CM | POA: Diagnosis not present

## 2016-07-04 DIAGNOSIS — W19XXXA Unspecified fall, initial encounter: Secondary | ICD-10-CM

## 2016-07-04 DIAGNOSIS — W19XXXD Unspecified fall, subsequent encounter: Secondary | ICD-10-CM | POA: Diagnosis not present

## 2016-07-04 DIAGNOSIS — N390 Urinary tract infection, site not specified: Secondary | ICD-10-CM | POA: Diagnosis not present

## 2016-07-04 DIAGNOSIS — J9601 Acute respiratory failure with hypoxia: Secondary | ICD-10-CM | POA: Diagnosis not present

## 2016-07-04 DIAGNOSIS — M6281 Muscle weakness (generalized): Secondary | ICD-10-CM | POA: Diagnosis not present

## 2016-07-04 DIAGNOSIS — D62 Acute posthemorrhagic anemia: Secondary | ICD-10-CM | POA: Diagnosis not present

## 2016-07-04 DIAGNOSIS — S72401A Unspecified fracture of lower end of right femur, initial encounter for closed fracture: Secondary | ICD-10-CM | POA: Diagnosis not present

## 2016-07-04 DIAGNOSIS — S299XXA Unspecified injury of thorax, initial encounter: Secondary | ICD-10-CM | POA: Diagnosis not present

## 2016-07-04 DIAGNOSIS — S7291XA Unspecified fracture of right femur, initial encounter for closed fracture: Secondary | ICD-10-CM

## 2016-07-04 DIAGNOSIS — S8990XA Unspecified injury of unspecified lower leg, initial encounter: Secondary | ICD-10-CM | POA: Diagnosis not present

## 2016-07-04 LAB — BASIC METABOLIC PANEL
Anion gap: 9 (ref 5–15)
BUN: 19 mg/dL (ref 6–20)
CALCIUM: 9.3 mg/dL (ref 8.9–10.3)
CO2: 27 mmol/L (ref 22–32)
CREATININE: 0.8 mg/dL (ref 0.44–1.00)
Chloride: 104 mmol/L (ref 101–111)
Glucose, Bld: 285 mg/dL — ABNORMAL HIGH (ref 65–99)
Potassium: 3.7 mmol/L (ref 3.5–5.1)
SODIUM: 140 mmol/L (ref 135–145)

## 2016-07-04 LAB — CBC WITH DIFFERENTIAL/PLATELET
BASOS ABS: 0 10*3/uL (ref 0.0–0.1)
BASOS PCT: 0 %
EOS ABS: 0 10*3/uL (ref 0.0–0.7)
EOS PCT: 0 %
HEMATOCRIT: 38.5 % (ref 36.0–46.0)
HEMOGLOBIN: 12.8 g/dL (ref 12.0–15.0)
Lymphocytes Relative: 9 %
Lymphs Abs: 1.3 10*3/uL (ref 0.7–4.0)
MCH: 26.4 pg (ref 26.0–34.0)
MCHC: 33.2 g/dL (ref 30.0–36.0)
MCV: 79.5 fL (ref 78.0–100.0)
Monocytes Absolute: 0.5 10*3/uL (ref 0.1–1.0)
Monocytes Relative: 3 %
NEUTROS PCT: 88 %
Neutro Abs: 12.9 10*3/uL — ABNORMAL HIGH (ref 1.7–7.7)
Platelets: 267 10*3/uL (ref 150–400)
RBC: 4.84 MIL/uL (ref 3.87–5.11)
RDW: 15.1 % (ref 11.5–15.5)
WBC: 14.7 10*3/uL — ABNORMAL HIGH (ref 4.0–10.5)

## 2016-07-04 LAB — PROTIME-INR
INR: 0.95
Prothrombin Time: 12.7 seconds (ref 11.4–15.2)

## 2016-07-04 LAB — CBG MONITORING, ED: Glucose-Capillary: 301 mg/dL — ABNORMAL HIGH (ref 65–99)

## 2016-07-04 LAB — TYPE AND SCREEN
ABO/RH(D): O POS
Antibody Screen: NEGATIVE

## 2016-07-04 MED ORDER — TRAMADOL HCL 50 MG PO TABS
50.0000 mg | ORAL_TABLET | Freq: Four times a day (QID) | ORAL | Status: DC | PRN
  Administered 2016-07-06: 50 mg via ORAL
  Filled 2016-07-04: qty 1

## 2016-07-04 MED ORDER — SODIUM CHLORIDE 0.9% FLUSH
3.0000 mL | Freq: Two times a day (BID) | INTRAVENOUS | Status: DC
  Administered 2016-07-05 – 2016-07-07 (×3): 3 mL via INTRAVENOUS

## 2016-07-04 MED ORDER — ONDANSETRON HCL 4 MG PO TABS
4.0000 mg | ORAL_TABLET | Freq: Four times a day (QID) | ORAL | Status: DC | PRN
  Administered 2016-07-06: 4 mg via ORAL
  Filled 2016-07-04: qty 1

## 2016-07-04 MED ORDER — CEFAZOLIN SODIUM-DEXTROSE 2-4 GM/100ML-% IV SOLN: 2.0000 g | INTRAVENOUS | Status: DC

## 2016-07-04 MED ORDER — INSULIN DETEMIR 100 UNIT/ML ~~LOC~~ SOLN
12.0000 [IU] | Freq: Every day | SUBCUTANEOUS | Status: DC
  Administered 2016-07-05 – 2016-07-08 (×4): 12 [IU] via SUBCUTANEOUS
  Filled 2016-07-04 (×4): qty 0.12

## 2016-07-04 MED ORDER — ALPRAZOLAM 0.25 MG PO TABS
0.2500 mg | ORAL_TABLET | Freq: Two times a day (BID) | ORAL | Status: DC | PRN
  Administered 2016-07-06 – 2016-07-07 (×3): 0.25 mg via ORAL
  Filled 2016-07-04 (×3): qty 1

## 2016-07-04 MED ORDER — PRAMIPEXOLE DIHYDROCHLORIDE 0.25 MG PO TABS: 0.3750 mg | ORAL_TABLET | Freq: Three times a day (TID) | ORAL | Status: DC

## 2016-07-04 MED ORDER — HYDROCORTISONE 1 % EX CREA: 1.0000 "application " | TOPICAL_CREAM | Freq: Every day | CUTANEOUS | Status: DC | PRN

## 2016-07-04 MED ORDER — PRAMIPEXOLE DIHYDROCHLORIDE 0.25 MG PO TABS
0.3750 mg | ORAL_TABLET | Freq: Three times a day (TID) | ORAL | Status: DC
  Administered 2016-07-05 – 2016-07-08 (×8): 0.375 mg via ORAL
  Filled 2016-07-04 (×11): qty 1

## 2016-07-04 MED ORDER — FLEET ENEMA 7-19 GM/118ML RE ENEM: 1.0000 | ENEMA | Freq: Once | RECTAL | Status: DC | PRN

## 2016-07-04 MED ORDER — POTASSIUM CHLORIDE CRYS ER 20 MEQ PO TBCR
20.0000 meq | EXTENDED_RELEASE_TABLET | Freq: Every day | ORAL | Status: DC
  Administered 2016-07-05 – 2016-07-08 (×4): 20 meq via ORAL
  Filled 2016-07-04 (×4): qty 1

## 2016-07-04 MED ORDER — INSULIN ASPART 100 UNIT/ML ~~LOC~~ SOLN: 0.0000 [IU] | Freq: Three times a day (TID) | SUBCUTANEOUS | Status: DC

## 2016-07-04 MED ORDER — SODIUM CHLORIDE 0.9 % IV SOLN: 250.0000 mL | INTRAVENOUS | Status: DC | PRN

## 2016-07-04 MED ORDER — SODIUM CHLORIDE 0.9% FLUSH
3.0000 mL | INTRAVENOUS | Status: DC | PRN
Start: 2016-07-04 — End: 2016-07-08

## 2016-07-04 MED ORDER — POLYETHYLENE GLYCOL 3350 17 G PO PACK: 17.0000 g | PACK | Freq: Every day | ORAL | Status: DC | PRN

## 2016-07-04 MED ORDER — HYDROCODONE-ACETAMINOPHEN 7.5-325 MG PO TABS
1.0000 | ORAL_TABLET | Freq: Four times a day (QID) | ORAL | Status: DC | PRN
  Administered 2016-07-06: 1 via ORAL
  Filled 2016-07-04: qty 1

## 2016-07-04 MED ORDER — LOSARTAN POTASSIUM 50 MG PO TABS
100.0000 mg | ORAL_TABLET | Freq: Every day | ORAL | Status: DC
  Administered 2016-07-05 – 2016-07-08 (×4): 100 mg via ORAL
  Filled 2016-07-04 (×4): qty 2

## 2016-07-04 MED ORDER — INSULIN DETEMIR 100 UNIT/ML ~~LOC~~ SOLN
5.0000 [IU] | Freq: Every day | SUBCUTANEOUS | Status: DC
  Administered 2016-07-05 – 2016-07-07 (×4): 5 [IU] via SUBCUTANEOUS
  Filled 2016-07-04 (×5): qty 0.05

## 2016-07-04 MED ORDER — HYDROMORPHONE HCL 1 MG/ML IJ SOLN
0.5000 mg | INTRAMUSCULAR | Status: DC | PRN
  Administered 2016-07-04: 1 mg via INTRAVENOUS
  Administered 2016-07-05: 0.5 mg via INTRAVENOUS
  Administered 2016-07-05 (×2): 1 mg via INTRAVENOUS
  Administered 2016-07-05 – 2016-07-08 (×11): 0.5 mg via INTRAVENOUS
  Administered 2016-07-08: 1 mg via INTRAVENOUS
  Administered 2016-07-08: 0.5 mg via INTRAVENOUS
  Filled 2016-07-04: qty 1
  Filled 2016-07-04 (×6): qty 0.5
  Filled 2016-07-04: qty 1
  Filled 2016-07-04 (×3): qty 0.5
  Filled 2016-07-04: qty 1
  Filled 2016-07-04: qty 0.5
  Filled 2016-07-04: qty 1
  Filled 2016-07-04: qty 0.5
  Filled 2016-07-04: qty 1

## 2016-07-04 MED ORDER — LORATADINE 10 MG PO TABS: 10.0000 mg | ORAL_TABLET | Freq: Every day | ORAL | Status: DC | PRN

## 2016-07-04 MED ORDER — BISACODYL 10 MG RE SUPP: 10.0000 mg | Freq: Every day | RECTAL | Status: DC | PRN

## 2016-07-04 MED ORDER — ACETAMINOPHEN 325 MG PO TABS
650.0000 mg | ORAL_TABLET | Freq: Four times a day (QID) | ORAL | Status: DC | PRN
  Administered 2016-07-07: 650 mg via ORAL
  Filled 2016-07-04: qty 2

## 2016-07-04 MED ORDER — ONDANSETRON HCL 4 MG/2ML IJ SOLN
4.0000 mg | Freq: Four times a day (QID) | INTRAMUSCULAR | Status: DC | PRN
  Administered 2016-07-07: 4 mg via INTRAVENOUS
  Filled 2016-07-04: qty 2

## 2016-07-04 MED ORDER — METOPROLOL SUCCINATE ER 50 MG PO TB24
50.0000 mg | ORAL_TABLET | Freq: Every day | ORAL | Status: DC
  Administered 2016-07-05 – 2016-07-08 (×3): 50 mg via ORAL
  Filled 2016-07-04 (×3): qty 1

## 2016-07-04 MED ORDER — DOCUSATE SODIUM 100 MG PO CAPS
100.0000 mg | ORAL_CAPSULE | Freq: Two times a day (BID) | ORAL | Status: DC
  Administered 2016-07-05 – 2016-07-08 (×4): 100 mg via ORAL
  Filled 2016-07-04 (×6): qty 1

## 2016-07-04 MED ORDER — FENTANYL CITRATE (PF) 100 MCG/2ML IJ SOLN
50.0000 ug | INTRAMUSCULAR | Status: DC | PRN
  Administered 2016-07-04: 50 ug via INTRAVENOUS
  Filled 2016-07-04: qty 2

## 2016-07-04 MED ORDER — DULOXETINE HCL 30 MG PO CPEP
30.0000 mg | ORAL_CAPSULE | Freq: Two times a day (BID) | ORAL | Status: DC
  Administered 2016-07-05 – 2016-07-08 (×5): 30 mg via ORAL
  Filled 2016-07-04 (×6): qty 1

## 2016-07-04 MED ORDER — AMLODIPINE BESYLATE 5 MG PO TABS
5.0000 mg | ORAL_TABLET | Freq: Every day | ORAL | Status: DC
Start: 2016-07-05 — End: 2016-07-08
  Administered 2016-07-05 – 2016-07-08 (×4): 5 mg via ORAL
  Filled 2016-07-04 (×4): qty 1

## 2016-07-04 MED ORDER — INSULIN ASPART 100 UNIT/ML ~~LOC~~ SOLN
0.0000 [IU] | Freq: Three times a day (TID) | SUBCUTANEOUS | Status: DC
Start: 2016-07-05 — End: 2016-07-08
  Administered 2016-07-05 (×2): 8 [IU] via SUBCUTANEOUS
  Administered 2016-07-05 – 2016-07-06 (×2): 11 [IU] via SUBCUTANEOUS
  Administered 2016-07-06: 5 [IU] via SUBCUTANEOUS
  Administered 2016-07-07: 15 [IU] via SUBCUTANEOUS
  Administered 2016-07-07: 8 [IU] via SUBCUTANEOUS
  Administered 2016-07-07: 15 [IU] via SUBCUTANEOUS
  Administered 2016-07-08: 11 [IU] via SUBCUTANEOUS

## 2016-07-04 MED ORDER — INSULIN ASPART 100 UNIT/ML ~~LOC~~ SOLN: 0.0000 [IU] | Freq: Every day | SUBCUTANEOUS | Status: DC

## 2016-07-04 MED ORDER — ACETAMINOPHEN 650 MG RE SUPP: 650.0000 mg | Freq: Four times a day (QID) | RECTAL | Status: DC | PRN

## 2016-07-04 MED ORDER — PANTOPRAZOLE SODIUM 40 MG PO TBEC: 40.0000 mg | DELAYED_RELEASE_TABLET | Freq: Every day | ORAL | Status: DC

## 2016-07-04 MED ORDER — SODIUM CHLORIDE 0.9 % IV SOLN
INTRAVENOUS | Status: DC
  Administered 2016-07-05 – 2016-07-08 (×3): via INTRAVENOUS

## 2016-07-04 NOTE — ED Notes (Signed)
Bed: FC14 Expected date:  Expected time:  Means of arrival:  Comments: EMS femur fx

## 2016-07-04 NOTE — ED Provider Notes (Signed)
[place] DEPT Provider Note   CSN: [id number] Arrival date & time: 07/04/16  1658     History   Chief Complaint Chief Complaint  Patient presents with  . Right Femur Fracture  . Fall    HPI [name] is a 76 y.o. female.  The history is provided by a relative, the patient and medical records.  Fall  This is a new problem. The current episode started 3 to 5 hours ago. The problem occurs constantly. The problem has been resolved. Pertinent negatives include no chest pain, no abdominal pain, no headaches and no shortness of breath. Nothing aggravates the symptoms. Nothing relieves the symptoms. She has tried nothing for the symptoms. The treatment provided no relief.    Past Medical History:  Diagnosis Date  . Anxiety   . Depression   . Diabetes mellitus   . Dyspnea   . GERD (gastroesophageal reflux disease)   . Headache(784.0)   . HTN (hypertension)    dr Johnsie Cancel  . Hypercholesteremia    DENIES  . Osteoarthritis   . PONV (postoperative nausea and vomiting)   . RLS (restless legs syndrome)   . Stress     Patient Active Problem List   Diagnosis Date Noted  . Abdominal pain, chronic, epigastric 08/10/2015  . Dehydration   . Leukocytosis   . Aspiration pneumonia ([place]) 07/21/2014  . DM type 2 (diabetes mellitus, type 2) ([place]) 07/21/2014  . Palliative care encounter 07/21/2014  . Dysphagia 07/09/2014  . Decubitus ulcer 07/09/2014  . Blood poisoning   . Acute respiratory failure with hypoxia ([place]) 06/30/2014  . Septic shock ([place]) 06/30/2014  . Lactic acidosis 06/30/2014  . Dementia 06/30/2014  . Acute respiratory failure with hypoxemia ([place])   . Hypernatremia   . Acute kidney injury ([place]) 05/28/2014  . Diabetes ([place]) 05/28/2014  . Fever 05/27/2014  . UTI (lower urinary tract infection) 05/27/2014  . Sepsis ([place]) 05/27/2014  . Acute encephalopathy   . FTT (failure to thrive) in adult   . Gait instability 07/18/2013    Class: Acute  . Low back  pain 07/18/2013    Class: Acute  . Nausea with vomiting 07/18/2013    Class: Acute  . Abnormality of gait 04/29/2013  . 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 Head trauma 06/03/2010  . HYPERCHOLESTEROLEMIA 05/15/2008  . ANXIETY DEPRESSION 05/15/2008  . DEPRESSION 05/15/2008  . Essential hypertension 05/15/2008  . OSTEOARTHRITIS 05/15/2008  . Edema 05/15/2008  . DYSPNEA ON EXERTION 05/15/2008  . ELECTROCARDIOGRAM, ABNORMAL 05/15/2008  . DIABETES MELLITUS, TYPE II, HX OF 05/15/2008    Past Surgical History:  Procedure Laterality Date  . ABDOMINAL HYSTERECTOMY    . COLONOSCOPY    . left forearm fracture with ORIF Left   . LUMBAR LAMINECTOMY/DECOMPRESSION MICRODISCECTOMY N/A 04/18/2012   Procedure: LUMBAR LAMINECTOMY/DECOMPRESSION MICRODISCECTOMY 3 LEVELS;  Surgeon: Winfield Cunas, MD;  Location: [facility];  Service: Neurosurgery;  Laterality: N/A;  Lumbar three-four, lumbar four-five, lumbar five-sacral one decompression. Synovial cyst resection lumbar four-five  . REPLACEMENT TOTAL KNEE Right   . WISDOM TOOTH EXTRACTION      OB History    No data available       Home Medications    Prior to Admission medications   Medication Sig Start Date End Date Taking? Authorizing Provider  acetaminophen (TYLENOL) 650 MG CR tablet Take 650 mg by mouth 2 (two) times daily.    [provider]  ALPRAZolam Duanne Moron) 0.5 MG tablet  Take 1 tablet (0.5 mg total) by mouth 2 (two) times daily as needed for anxiety. 07/22/14   Robbie Lis, MD  amLODipine (NORVASC) 5 MG tablet Take 1 tablet (5 mg total) by mouth daily. 07/14/14   Ghimire, Henreitta Leber, MD  aspirin 81 MG chewable tablet Chew 81 mg by mouth daily.    [provider]  Cranberry-Vitamin C-Probiotic (AZO CRANBERRY) 250-30 MG TABS Take 1 tablet by mouth daily.    [provider]  ergocalciferol (VITAMIN D2) 50000 UNITS capsule Take 50,000 Units by mouth once a week.    [provider]  insulin aspart (NOVOLOG) 100 UNIT/ML injection Inject 0-9 Units into the skin 3 (three) times daily with meals. CBG < 70: implement hypoglycemia protocol CBG 70 - 120: 0 units CBG 121 - 150: 1 unit CBG 151 - 200: 2 units CBG 201 - 250: 3 units CBG 251 - 300: 5 units CBG 301 - 350: 7 units CBG 351 - 400: 9 units CBG > 400: call MD 07/14/14   Jonetta Osgood, MD  insulin detemir (LEVEMIR) 100 UNIT/ML injection Inject 0.05 mLs (5 Units total) into the skin at bedtime. Patient taking differently: Inject 10 Units into the skin daily.  07/14/14   Ghimire, Henreitta Leber, MD  loratadine (CLARITIN) 10 MG tablet Take 10 mg by mouth daily as needed for allergies.     [provider]  losartan (COZAAR) 100 MG tablet Take 100 mg by mouth daily.    [provider]  Magnesium Oxide 500 MG TABS Take 500 mg by mouth daily.     [provider]  Maltodextrin-Xanthan Gum (RESOURCE THICKENUP CLEAR) POWD Take 120 g by mouth as needed. Patient not taking: Reported on 08/10/2015 07/22/14   Robbie Lis, MD  metoprolol succinate (TOPROL-XL) 50 MG 24 hr tablet Take 1 tablet (50 mg total) by mouth daily. 07/04/14   Robbie Lis, MD  omeprazole (PRILOSEC) 40 MG capsule Take 40 mg by mouth daily.    [provider]  ondansetron (ZOFRAN) 4 MG tablet Take 4 mg by mouth every 8 (eight) hours as needed for nausea or vomiting.     [provider]  polyethylene glycol (MIRALAX / GLYCOLAX) packet Take 17 g by mouth daily. 07/26/13   [name], MD  potassium chloride SA (K-DUR,KLOR-CON) 20 MEQ tablet Take 1 tablet (20 mEq total) by mouth daily. 07/22/14   Robbie Lis, MD  pramipexole (MIRAPEX) 0.25 MG tablet Take 1.5 tablets by mouth three times daily    [provider]    Family History Family History  Problem Relation Age of Onset  . Diabetes Father   . High blood pressure Father   . High blood pressure Mother   . Colon cancer Neg Hx   .  Esophageal cancer Neg Hx   . Liver disease Neg Hx   . Stomach cancer Neg Hx   . Pancreatic cancer Neg Hx     Social History Social History  Substance Use Topics  . Smoking status: Never Smoker  . Smokeless tobacco: Never Used  . Alcohol use No     Allergies   Morphine and related; Oxycodone; and Sulfonamide derivatives   Review of Systems Review of Systems  Constitutional: Negative for chills, diaphoresis, fatigue and fever.  HENT: Negative for congestion.   Respiratory: Negative for chest tightness, shortness of breath, wheezing and stridor.   Cardiovascular: Negative for chest pain and leg swelling.  Gastrointestinal: Negative for  abdominal pain, constipation, diarrhea, nausea and vomiting.  Genitourinary: Negative for flank pain.  Musculoskeletal: Negative for neck pain and neck stiffness.  Skin: Negative for pallor and wound.  Neurological: Negative for headaches.  Psychiatric/Behavioral: Negative for agitation.  All other systems reviewed and are negative.    Physical Exam Updated Vital Signs There were no vitals taken for this visit.  Physical Exam  Constitutional: She is oriented to person, place, and time. She appears well-developed and well-nourished. No distress.  HENT:  Head: Normocephalic and atraumatic.  Mouth/Throat: Oropharynx is clear and moist. No oropharyngeal exudate.  Eyes: Conjunctivae and EOM are normal. Pupils are equal, round, and reactive to light.  Neck: Normal range of motion. Neck supple.  Cardiovascular: Normal rate and regular rhythm.   No murmur heard. Pulmonary/Chest: Effort normal and breath sounds normal. No stridor. No respiratory distress. She has no wheezes. She exhibits no tenderness.  Abdominal: Soft. There is no tenderness.  Musculoskeletal: She exhibits tenderness. She exhibits no edema.       Right knee: Tenderness found.       Right upper leg: She exhibits tenderness.       Legs: Normal pulses and sensation in the lower  extremities and normal strength at the ankle.  Neurological: She is alert and oriented to person, place, and time. She is not disoriented. No cranial nerve deficit or sensory deficit. She exhibits normal muscle tone.  Skin: Skin is warm and dry. Capillary refill takes less than 2 seconds. No rash noted. She is not diaphoretic. No erythema.  Psychiatric: She has a normal mood and affect.  Nursing note and vitals reviewed.    ED Treatments / Results  Labs (all labs ordered are listed, but only abnormal results are displayed) Labs Reviewed  BASIC METABOLIC PANEL - Abnormal; Notable for the following:       Result Value   Glucose, Bld 285 (*)    All other components within normal limits  CBC WITH DIFFERENTIAL/PLATELET - Abnormal; Notable for the following:    WBC 14.7 (*)    Neutro Abs 12.9 (*)    All other components within normal limits  CBG MONITORING, ED - Abnormal; Notable for the following:    Glucose-Capillary 301 (*)    All other components within normal limits  PROTIME-INR  COMPREHENSIVE METABOLIC PANEL  PROTIME-INR  APTT  CBC  TYPE AND SCREEN    EKG  EKG Interpretation None       Radiology Dg Chest 1 View  Result Date: 07/04/2016 CLINICAL DATA:  Fall with leg injury EXAM: CHEST 1 VIEW COMPARISON:  07/20/2014 FINDINGS: Borderline to mild cardiomegaly without overt failure. No consolidation or pleural effusion. No pneumothorax. IMPRESSION: Low lung volumes with borderline cardiomegaly. No edema or infiltrate. Electronically Signed   By: Donavan Foil M.D.   On: 07/04/2016 19:14   Dg Knee Complete 4 Views Right  Result Date: 07/04/2016 CLINICAL DATA:  Injury, fall EXAM: RIGHT KNEE - COMPLETE 4+ VIEW COMPARISON:  11/09/2013 FINDINGS: The patient is status post right knee replacement. Mildly comminuted and impacted fracture of the distal femoral shaft and metaphysis, just proximal to the prosthetic. Approximately 1/4 shaft diameter of posterior and lateral displacement  of distal fracture fragment with mild posterior angulation of the distal fragment as well. IMPRESSION: Acute, mildly displaced and angulated distal femoral fracture, adjacent to the femoral prosthetic. Electronically Signed   By: Donavan Foil M.D.   On: 07/04/2016 19:13   Dg Femur Min 2 Views Right  Result Date: 07/04/2016 CLINICAL DATA:  Fall at nursing home today deformity to the right leg EXAM: RIGHT FEMUR 2 VIEWS COMPARISON:  None. FINDINGS: Right femoral head projects in joint. Right pubic rami appear intact. No fracture or dislocation of the proximal right femur. Comminuted distal femoral shaft fracture just above the right femoral prosthetic. There is mild posterior angulation of distal fracture fragment. IMPRESSION: Comminuted and mildly angulated fracture involving the distal shaft and metaphysis of the femur, just proximal to the femoral prosthetic. Electronically Signed   By: Donavan Foil M.D.   On: 07/04/2016 19:11    Procedures Procedures (including critical care time)  Medications Ordered in ED Medications  0.9 %  sodium chloride infusion (not administered)  sodium chloride flush (NS) 0.9 % injection 3 mL (not administered)  sodium chloride flush (NS) 0.9 % injection 3 mL (not administered)  0.9 %  sodium chloride infusion (not administered)  acetaminophen (TYLENOL) tablet 650 mg (not administered)    Or  acetaminophen (TYLENOL) suppository 650 mg (not administered)  docusate sodium (COLACE) capsule 100 mg (not administered)  polyethylene glycol (MIRALAX / GLYCOLAX) packet 17 g (not administered)  bisacodyl (DULCOLAX) suppository 10 mg (not administered)  sodium phosphate (FLEET) 7-19 GM/118ML enema 1 enema (not administered)  ondansetron (ZOFRAN) tablet 4 mg (not administered)    Or  ondansetron (ZOFRAN) injection 4 mg (not administered)  ceFAZolin (ANCEF) IVPB 2g/100 mL premix (not administered)  insulin aspart (novoLOG) injection 0-15 Units (not administered)    HYDROcodone-acetaminophen (NORCO) 7.5-325 MG per tablet 1 tablet (not administered)  HYDROmorphone (DILAUDID) injection 0.5-1 mg (1 mg Intravenous Given 07/04/16 2215)  ALPRAZolam (XANAX) tablet 0.25 mg (not administered)  DULoxetine (CYMBALTA) DR capsule 30 mg (not administered)  hydrocortisone cream 1 % 1 application (not administered)  insulin detemir (LEVEMIR) injection 12 Units (not administered)  metoprolol succinate (TOPROL-XL) 24 hr tablet 50 mg (not administered)  loratadine (CLARITIN) tablet 10 mg (not administered)  losartan (COZAAR) tablet 100 mg (not administered)  insulin aspart (novoLOG) injection 0-9 Units (not administered)  amLODipine (NORVASC) tablet 5 mg (not administered)  potassium chloride SA (K-DUR,KLOR-CON) CR tablet 20 mEq (not administered)  traMADol (ULTRAM) tablet 50 mg (not administered)  pantoprazole (PROTONIX) EC tablet 40 mg (not administered)  insulin detemir (LEVEMIR) injection 5 Units (not administered)  pramipexole (MIRAPEX) tablet 0.375 mg (not administered)     Initial Impression / Assessment and Plan / ED Course  I have reviewed the triage vital signs and the nursing notes.  Pertinent labs & imaging results that were available during my care of the patient were reviewed by me and considered in my medical decision making (see chart for details).     MARCELA ALATORRE is a 76 y.o. female with a past medical history significant for hypertension, diabetes, GERD, and prior right knee replacement who presents with a fall and right leg pain. Patient reports that she was sitting in a chair and reach for a folder when she fell out of the chair onto her right leg. Patient denied hitting her head. She denied loss of consciousness. She denies any other sites of injury aside from the leg pain. She is a resident at [facility] and they did an x-ray showing concern for fracture of the femur. Patient transferred for further evaluation. Next  Patient denies any  preceding symptoms. She denies chest pain, shortness breath, palpitations, abdominal pain, back pain. She denies any headache or neck pain. She reports her right distal thigh pain as 10 out  of 10 when she moves. She reports normal sensation, strength, and her right leg. She has normal pulses. Normal capillary refill. Normal sensation.  Patient has tenderness proximal to the rig knee. Patient has surgical scars from previously replaced.  Patient will have preoperative lab testing, x-ray, and EKG. Patient will have x-ray of the right femur and right knee given the prior replacement. Anticipate speaking with orthopedics for further management.  X-ray revealed right femur fracture just proximal to the knee replacement. Orthopedics called. They will see the patient and admit the patient for further management.  Patient admitted in stable condition.   Final Clinical Impressions(s) / ED Diagnoses   Final diagnoses:  Closed fracture of right femur, unspecified fracture morphology, unspecified portion of femur, initial encounter ([place])  Fall, initial encounter     Clinical Impression: 1. Closed fracture of right femur, unspecified fracture morphology, unspecified portion of femur, initial encounter ([place])   2. Pain   3. Fall, initial encounter     Disposition: Admit to Orthopedics    Tegeler, Gwenyth Allegra, MD 07/05/16 [phone]

## 2016-07-04 NOTE — ED Triage Notes (Signed)
Per EMS [facility] with confirmed right femur fracture post fall. Pt has strong right pedal pulse.

## 2016-07-04 NOTE — H&P (Signed)
[name] is an 76 y.o. female.   Chief Complaint: Right knee pain  HPI: [facility] today. Brought by EMS with knee pain.  Past Medical History:  Diagnosis Date  . Anxiety   . Depression   . Diabetes mellitus   . Dyspnea   . GERD (gastroesophageal reflux disease)   . Headache(784.0)   . HTN (hypertension)    dr Johnsie Cancel  . Hypercholesteremia    DENIES  . Osteoarthritis   . PONV (postoperative nausea and vomiting)   . RLS (restless legs syndrome)   . Stress     Past Surgical History:  Procedure Laterality Date  . ABDOMINAL HYSTERECTOMY    . COLONOSCOPY    . left forearm fracture with ORIF Left   . LUMBAR LAMINECTOMY/DECOMPRESSION MICRODISCECTOMY N/A 04/18/2012   Procedure: LUMBAR LAMINECTOMY/DECOMPRESSION MICRODISCECTOMY 3 LEVELS;  Surgeon: Winfield Cunas, MD;  Location: [facility];  Service: Neurosurgery;  Laterality: N/A;  Lumbar three-four, lumbar four-five, lumbar five-sacral one decompression. Synovial cyst resection lumbar four-five  . REPLACEMENT TOTAL KNEE Right   . WISDOM TOOTH EXTRACTION      Family History  Problem Relation Age of Onset  . Diabetes Father   . High blood pressure Father   . High blood pressure Mother   . Colon cancer Neg Hx   . Esophageal cancer Neg Hx   . Liver disease Neg Hx   . Stomach cancer Neg Hx   . Pancreatic cancer Neg Hx    Social History:  reports that she has never smoked. She has never used smokeless tobacco. She reports that she does not drink alcohol or use drugs.  Allergies:  Allergies  Allergen Reactions  . Morphine And Related Other (See Comments)    hallucination   . Oxycodone Other (See Comments)    hallucination   . Sulfonamide Derivatives Swelling     (Not in a hospital admission)  Results for orders placed or performed during the hospital encounter of 07/04/16 (from the past 48 hour(s))  Basic metabolic panel     Status: Abnormal   Collection Time: 07/04/16  6:28 PM  Result Value Ref Range   Sodium 140  135 - 145 mmol/L   Potassium 3.7 3.5 - 5.1 mmol/L   Chloride 104 101 - 111 mmol/L   CO2 27 22 - 32 mmol/L   Glucose, Bld 285 (H) 65 - 99 mg/dL   BUN 19 6 - 20 mg/dL   Creatinine, Ser 0.80 0.44 - 1.00 mg/dL   Calcium 9.3 8.9 - 10.3 mg/dL   GFR calc non Af Amer >60 >60 mL/min   GFR calc Af Amer >60 >60 mL/min    Comment: (NOTE) The eGFR has been calculated using the CKD EPI equation. This calculation has not been validated in all clinical situations. eGFR's persistently <60 mL/min signify possible Chronic Kidney Disease.    Anion gap 9 5 - 15  CBC WITH DIFFERENTIAL     Status: Abnormal   Collection Time: 07/04/16  6:28 PM  Result Value Ref Range   WBC 14.7 (H) 4.0 - 10.5 K/uL   RBC 4.84 3.87 - 5.11 MIL/uL   Hemoglobin 12.8 12.0 - 15.0 g/dL   HCT 38.5 36.0 - 46.0 %   MCV 79.5 78.0 - 100.0 fL   MCH 26.4 26.0 - 34.0 pg   MCHC 33.2 30.0 - 36.0 g/dL   RDW 15.1 11.5 - 15.5 %   Platelets 267 150 - 400 K/uL   Neutrophils Relative % 88 %  Neutro Abs 12.9 (H) 1.7 - 7.7 K/uL   Lymphocytes Relative 9 %   Lymphs Abs 1.3 0.7 - 4.0 K/uL   Monocytes Relative 3 %   Monocytes Absolute 0.5 0.1 - 1.0 K/uL   Eosinophils Relative 0 %   Eosinophils Absolute 0.0 0.0 - 0.7 K/uL   Basophils Relative 0 %   Basophils Absolute 0.0 0.0 - 0.1 K/uL  Protime-INR     Status: None   Collection Time: 07/04/16  6:28 PM  Result Value Ref Range   Prothrombin Time 12.7 11.4 - 15.2 seconds   INR 0.95   Type and screen [place]     Status: None   Collection Time: 07/04/16  6:28 PM  Result Value Ref Range   ABO/RH(D) O POS    Antibody Screen NEG    Sample Expiration 07/07/2016    Dg Chest 1 View  Result Date: 07/04/2016 CLINICAL DATA:  Fall with leg injury EXAM: CHEST 1 VIEW COMPARISON:  07/20/2014 FINDINGS: Borderline to mild cardiomegaly without overt failure. No consolidation or pleural effusion. No pneumothorax. IMPRESSION: Low lung volumes with borderline cardiomegaly. No edema or  infiltrate. Electronically Signed   By: Donavan Foil M.D.   On: 07/04/2016 19:14   Dg Knee Complete 4 Views Right  Result Date: 07/04/2016 CLINICAL DATA:  Injury, fall EXAM: RIGHT KNEE - COMPLETE 4+ VIEW COMPARISON:  11/09/2013 FINDINGS: The patient is status post right knee replacement. Mildly comminuted and impacted fracture of the distal femoral shaft and metaphysis, just proximal to the prosthetic. Approximately 1/4 shaft diameter of posterior and lateral displacement of distal fracture fragment with mild posterior angulation of the distal fragment as well. IMPRESSION: Acute, mildly displaced and angulated distal femoral fracture, adjacent to the femoral prosthetic. Electronically Signed   By: Donavan Foil M.D.   On: 07/04/2016 19:13   Dg Femur Min 2 Views Right  Result Date: 07/04/2016 CLINICAL DATA:  Fall at nursing home today deformity to the right leg EXAM: RIGHT FEMUR 2 VIEWS COMPARISON:  None. FINDINGS: Right femoral head projects in joint. Right pubic rami appear intact. No fracture or dislocation of the proximal right femur. Comminuted distal femoral shaft fracture just above the right femoral prosthetic. There is mild posterior angulation of distal fracture fragment. IMPRESSION: Comminuted and mildly angulated fracture involving the distal shaft and metaphysis of the femur, just proximal to the femoral prosthetic. Electronically Signed   By: Donavan Foil M.D.   On: 07/04/2016 19:11    Review of Systems  Musculoskeletal: Positive for joint pain.  All other systems reviewed and are negative.   Blood pressure (!) 173/101, pulse 70, temperature 98.1 F (36.7 C), temperature source Oral, resp. rate 19, weight 95.3 kg (210 lb), SpO2 96 %. Physical Exam  Constitutional: She is oriented to person, place, and time. She appears well-developed.  HENT:  Head: Normocephalic.  Eyes: Pupils are equal, round, and reactive to light.  Neck: Normal range of motion.  Cardiovascular: Normal rate.    Respiratory: Effort normal.  GI: Soft.  Musculoskeletal: She exhibits tenderness.  Swelling right knee. Mild deformity. Compartments soft. No DVT. Neurovascularly intact.  Neurological: She is alert and oriented to person, place, and time.  Skin: Skin is warm and dry.  Psychiatric: She has a normal mood and affect.     Assessment/Plan  Right knee mildly displaced periprosthetic femur fracture. Knee immoblizer. Admit. May require ORIF. Will discuss With Dr. Maureen Ralphs.  Johnn Hai, MD  [phone] 07/04/2016, 8:13 PM

## 2016-07-05 LAB — GLUCOSE, CAPILLARY
GLUCOSE-CAPILLARY: 345 mg/dL — AB (ref 65–99)
GLUCOSE-CAPILLARY: 299 mg/dL — AB (ref 65–99)
Glucose-Capillary: 285 mg/dL — ABNORMAL HIGH (ref 65–99)
Glucose-Capillary: 256 mg/dL — ABNORMAL HIGH (ref 65–99)

## 2016-07-05 LAB — COMPREHENSIVE METABOLIC PANEL
ALT: 15 U/L (ref 14–54)
AST: 13 U/L — ABNORMAL LOW (ref 15–41)
Albumin: 3.9 g/dL (ref 3.5–5.0)
Alkaline Phosphatase: 84 U/L (ref 38–126)
Anion gap: 11 (ref 5–15)
BUN: 21 mg/dL — ABNORMAL HIGH (ref 6–20)
CO2: 25 mmol/L (ref 22–32)
Calcium: 9.4 mg/dL (ref 8.9–10.3)
Chloride: 105 mmol/L (ref 101–111)
Creatinine, Ser: 0.91 mg/dL (ref 0.44–1.00)
GFR calc Af Amer: >60 mL/min (ref >60)
GFR calc non Af Amer: 59 mL/min — ABNORMAL LOW (ref >60)
Glucose, Bld: 373 mg/dL — ABNORMAL HIGH (ref 65–99)
Potassium: 3.7 mmol/L (ref 3.5–5.1)
Sodium: 141 mmol/L (ref 135–145)
Total Bilirubin: 0.8 mg/dL (ref 0.3–1.2)
Total Protein: 7.2 g/dL (ref 6.5–8.1)

## 2016-07-05 LAB — CBC
HCT: 37 % (ref 36.0–46.0)
Hemoglobin: 11.8 g/dL — ABNORMAL LOW (ref 12.0–15.0)
MCH: 25.5 pg — ABNORMAL LOW (ref 26.0–34.0)
MCHC: 31.9 g/dL (ref 30.0–36.0)
MCV: 80.1 fL (ref 78.0–100.0)
Platelets: 277 10*3/uL (ref 150–400)
RBC: 4.62 MIL/uL (ref 3.87–5.11)
RDW: 15.2 % (ref 11.5–15.5)
WBC: 12.2 10*3/uL — ABNORMAL HIGH (ref 4.0–10.5)

## 2016-07-05 LAB — MRSA PCR SCREENING: MRSA BY PCR: NEGATIVE

## 2016-07-05 LAB — PROTIME-INR
INR: 1.04
Prothrombin Time: 13.6 seconds (ref 11.4–15.2)

## 2016-07-05 LAB — APTT: aPTT: 35 seconds (ref 24–36)

## 2016-07-05 MED ORDER — NON FORMULARY
40.0000 mg | Freq: Every day | Status: DC
Start: 2016-07-05 — End: 2016-07-05

## 2016-07-05 MED ORDER — CEFAZOLIN SODIUM-DEXTROSE 2-4 GM/100ML-% IV SOLN: 2.0000 g | INTRAVENOUS | Status: DC

## 2016-07-05 MED ORDER — OMEPRAZOLE 20 MG PO CPDR
40.0000 mg | DELAYED_RELEASE_CAPSULE | Freq: Every day | ORAL | Status: DC
  Administered 2016-07-05 – 2016-07-08 (×2): 40 mg via ORAL
  Filled 2016-07-05 (×4): qty 2

## 2016-07-05 NOTE — Progress Notes (Signed)
Patients BP elevated at 174/90. MD paged. Will continue to monitor patient.

## 2016-07-05 NOTE — Progress Notes (Signed)
   Subjective: Hospital day - 1 Patient reports pain as moderate.   Patient seen in rounds with Dr. Wynelle Link.  Briefly discussed events leading to admission.  Informed Dr. Wynelle Link that she fell out of her chair at home directly onto the right knee sustaining impact injury.  She was brought to the [facility] ED where xrays proved positive for a periprosthetic distal femur fracture.  She was admitted and ortho services were consulted.  Dr. Tonita Cong who was covering for Dr. Wynelle Link was consulted.  Radiographs have been reviewed and it is felt that she would benefit from undergoing surgery.  Will plan for procedure tomorrow afternoon.  She may eat today and then clear liquids tomorrow morning. She will be made NPO tomorrow after breakfast. Patient is having problems with pain in the knee, requiring pain medications  Objective: Vital signs in last 24 hours: Temp:  [98.1 F (36.7 C)-98.5 F (36.9 C)] 98.5 F (36.9 C) (05/15 0545) Pulse Rate:  [70-79] 72 (05/15 0545) Resp:  [0-27] 18 (05/15 0545) BP: (112-174)/(64-108) 123/85 (05/15 0545) SpO2:  [96 %-100 %] 97 % (05/15 0545) Weight:  [95.3 kg (210 lb)] 95.3 kg (210 lb) (05/14 1711)  Intake/Output from previous day:  Intake/Output Summary (Last 24 hours) at 07/05/16 0903 Last data filed at 07/05/16 0551  Gross per 24 hour  Intake              375 ml  Output              725 ml  Net             -350 ml    Intake/Output this shift: No intake/output data recorded.  Labs:  Recent Labs  07/04/16 1828 07/05/16 0630  HGB 12.8 11.8*    Recent Labs  07/04/16 1828 07/05/16 0630  WBC 14.7* 12.2*  RBC 4.84 4.62  HCT 38.5 37.0  PLT 267 277    Recent Labs  07/04/16 1828 07/05/16 0630  NA 140 141  K 3.7 3.7  CL 104 105  CO2 27 25  BUN 19 21*  CREATININE 0.80 0.91  GLUCOSE 285* 373*  CALCIUM 9.3 9.4    Recent Labs  07/04/16 1828 07/05/16 0630  INR 0.95 1.04    EXAM General - Patient is Alert and Appropriate Extremity -  Neurovascular intact Sensation intact distally Motor Function - intact, moving foot and toes well on exam.   Past Medical History:  Diagnosis Date  . Anxiety   . Depression   . Diabetes mellitus   . Dyspnea   . GERD (gastroesophageal reflux disease)   . Headache(784.0)   . HTN (hypertension)    dr Johnsie Cancel  . Hypercholesteremia    DENIES  . Osteoarthritis   . PONV (postoperative nausea and vomiting)   . RLS (restless legs syndrome)   . 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   Weight as of this encounter: 95.3 kg (210 lb).  Bedrest NPO after liquid breakfast tomorrow.  May have regular diet today. Consent for surgery. Recheck labs in the morning.  Arlee Muslim, PA-C Orthopaedic Surgery 07/05/2016, 9:03 AM

## 2016-07-05 NOTE — Clinical Social Work Note (Signed)
Clinical Social Work Assessment  Patient Details  Name: [name] MRN: [id number] Date of Birth: 26-Nov-1940  Date of referral:  07/05/16               Reason for consult:  Facility Placement                Permission sought to share information with:  Family Supports, Customer service manager Permission granted to share information::     Name::       Surveyor, mining::  [facility]   Relationship::  Daughter-Legal Guardian   Contact Information:  [phone]  Housing/Transportation Living arrangements for the past 2 months:  [place] of Information:  Adult Children Theme park manager Guardian ) Patient Interpreter Needed:  None Criminal Activity/Legal Involvement Pertinent to Current Situation/Hospitalization:  No - Comment as needed Significant Relationships:  Adult Children Lives with:  Facility Resident Do you feel safe going back to the place where you live?  Yes Need for family participation in patient care:  Yes (Comment)  Care giving concerns:  Patient is LongTerm Care resident at [facility]. Daughter reports patient will return to the facility at discharge.   Social Worker assessment / plan:  CSW spoke with patient daughter on phone. She is the legal guardian. The plan is for patient to return to [place].  Plan: CSW will assist with patient return to SNF when medically stable.   Employment status:  Retired Forensic scientist:  Medicare PT Recommendations:  Not assessed at this time Information / Referral to community resources:     Patient/Family's Response to care:  Agreeable.    Patient/Family's Understanding of and Emotional Response to Diagnosis, Current Treatment, and Prognosis:  Daughter involved in patient care. She is aware of diagnosis, current treatment and prognosis.   Emotional Assessment Appearance:  Developmentally appropriate Attitude/Demeanor/Rapport:    Affect  (typically observed):    Orientation:  Oriented to Self Alcohol / Substance use:    Psych involvement (Current and /or in the community):  No (Comment)  Discharge Needs  Concerns to be addressed:  Discharge Planning Concerns Readmission within the last 30 days:  No Current discharge risk:  None Barriers to Discharge:  Continued Medical Work up   [employer], [place] 07/05/2016, 10:50 AM

## 2016-07-05 NOTE — Care Management Note (Signed)
Case Management Note  Patient Details  Name: [name] MRN: [id number] Date of Birth: 1940/09/19  Subjective/Objective:                  Right knee mildly displaced periprosthetic femur fracture. Knee immoblizer. Admit. May require ORIF  Action/Plan:  snf-[facility] Date:  Jul 05, 2016 Chart reviewed for concurrent status and case management needs. Will continue to follow patient progress. Discharge Planning: following for needs Expected discharge date: [id number] Velva Harman, BSN, [place], [place] Expected Discharge Date:                  Expected Discharge Plan:  [place]  In-House Referral:  Clinical Social Work  Discharge planning Services  CM Consult  Post Acute Care Choice:    Choice offered to:     DME Arranged:    DME Agency:     HH Arranged:    [place] Agency:     Status of Service:  Completed, signed off  If discussed at [employer], dates discussed:    Additional Comments:  Leeroy Cha, RN 07/05/2016, 10:39 AM

## 2016-07-05 NOTE — Progress Notes (Signed)
RN giving patient her PO medication. Patient had trouble swallowing; patient given 2 cups of water to help swallow two small pills. Patient stable. Will continue to monitor.

## 2016-07-06 ENCOUNTER — Inpatient Hospital Stay (HOSPITAL_COMMUNITY): Payer: Medicare Other

## 2016-07-06 ENCOUNTER — Encounter (HOSPITAL_COMMUNITY): Payer: Self-pay | Admitting: Anesthesiology

## 2016-07-06 ENCOUNTER — Inpatient Hospital Stay (HOSPITAL_COMMUNITY): Payer: Medicare Other | Admitting: Anesthesiology

## 2016-07-06 ENCOUNTER — Encounter (HOSPITAL_COMMUNITY)
Admission: EM | Disposition: A | Discharge status: Skilled Nursing Facility | Payer: Self-pay | Source: Home / Self Care | Attending: Orthopedic Surgery

## 2016-07-06 HISTORY — PX: ORIF PERIPROSTHETIC FRACTURE: SHX5034

## 2016-07-06 LAB — BASIC METABOLIC PANEL
Anion gap: 11 (ref 5–15)
BUN: 22 mg/dL — AB (ref 6–20)
CHLORIDE: 106 mmol/L (ref 101–111)
CO2: 24 mmol/L (ref 22–32)
Calcium: 9.2 mg/dL (ref 8.9–10.3)
Creatinine, Ser: 0.98 mg/dL (ref 0.44–1.00)
GFR calc Af Amer: >60 mL/min (ref >60)
GFR calc non Af Amer: 54 mL/min — ABNORMAL LOW (ref >60)
GLUCOSE: 329 mg/dL — AB (ref 65–99)
POTASSIUM: 4 mmol/L (ref 3.5–5.1)
Sodium: 141 mmol/L (ref 135–145)

## 2016-07-06 LAB — GLUCOSE, CAPILLARY
Glucose-Capillary: 316 mg/dL — ABNORMAL HIGH (ref 65–99)
Glucose-Capillary: 248 mg/dL — ABNORMAL HIGH (ref 65–99)
Glucose-Capillary: 171 mg/dL — ABNORMAL HIGH (ref 65–99)
Glucose-Capillary: 248 mg/dL — ABNORMAL HIGH (ref 65–99)

## 2016-07-06 LAB — CBC
HEMATOCRIT: 33.8 % — AB (ref 36.0–46.0)
Hemoglobin: 10.9 g/dL — ABNORMAL LOW (ref 12.0–15.0)
MCH: 26.1 pg (ref 26.0–34.0)
MCHC: 32.2 g/dL (ref 30.0–36.0)
MCV: 81.1 fL (ref 78.0–100.0)
Platelets: 256 10*3/uL (ref 150–400)
RBC: 4.17 MIL/uL (ref 3.87–5.11)
RDW: 15.6 % — AB (ref 11.5–15.5)
WBC: 13.1 10*3/uL — ABNORMAL HIGH (ref 4.0–10.5)

## 2016-07-06 LAB — SURGICAL PCR SCREEN
MRSA, PCR: NEGATIVE
Staphylococcus aureus: POSITIVE — AB

## 2016-07-06 SURGERY — OPEN REDUCTION INTERNAL FIXATION (ORIF) PERIPROSTHETIC FRACTURE
Anesthesia: General | Laterality: Right

## 2016-07-06 MED ORDER — PROMETHAZINE HCL 25 MG/ML IJ SOLN: 6.2500 mg | INTRAMUSCULAR | Status: DC | PRN

## 2016-07-06 MED ORDER — ONDANSETRON HCL 4 MG/2ML IJ SOLN
INTRAMUSCULAR | Status: DC | PRN
  Administered 2016-07-06: 4 mg via INTRAVENOUS

## 2016-07-06 MED ORDER — MUPIROCIN 2 % EX OINT: 1.0000 "application " | TOPICAL_OINTMENT | Freq: Two times a day (BID) | CUTANEOUS | Status: DC

## 2016-07-06 MED ORDER — MUPIROCIN 2 % EX OINT
1.0000 "application " | TOPICAL_OINTMENT | Freq: Two times a day (BID) | CUTANEOUS | Status: DC
  Administered 2016-07-06 – 2016-07-08 (×4): 1 via NASAL
  Filled 2016-07-06: qty 22

## 2016-07-06 MED ORDER — PHENYLEPHRINE HCL 10 MG/ML IJ SOLN
INTRAVENOUS | Status: DC | PRN
  Administered 2016-07-06: 80 ug/min via INTRAVENOUS

## 2016-07-06 MED ORDER — FENTANYL CITRATE (PF) 100 MCG/2ML IJ SOLN
INTRAMUSCULAR | Status: AC
  Filled 2016-07-06: qty 2

## 2016-07-06 MED ORDER — ACETAMINOPHEN 10 MG/ML IV SOLN
INTRAVENOUS | Status: AC
  Filled 2016-07-06: qty 100

## 2016-07-06 MED ORDER — METHOCARBAMOL 1000 MG/10ML IJ SOLN
500.0000 mg | Freq: Four times a day (QID) | INTRAVENOUS | Status: DC | PRN
  Administered 2016-07-06: 500 mg via INTRAVENOUS
  Filled 2016-07-06: qty 550
  Filled 2016-07-06: qty 5

## 2016-07-06 MED ORDER — PROPOFOL 10 MG/ML IV BOLUS
INTRAVENOUS | Status: DC | PRN
  Administered 2016-07-06: 150 mg via INTRAVENOUS

## 2016-07-06 MED ORDER — MEPERIDINE HCL 50 MG/ML IJ SOLN: 6.2500 mg | INTRAMUSCULAR | Status: DC | PRN

## 2016-07-06 MED ORDER — CHLORHEXIDINE GLUCONATE CLOTH 2 % EX PADS
6.0000 | MEDICATED_PAD | Freq: Every day | CUTANEOUS | Status: DC
  Administered 2016-07-06 – 2016-07-08 (×3): 6 via TOPICAL

## 2016-07-06 MED ORDER — CEFAZOLIN SODIUM-DEXTROSE 2-4 GM/100ML-% IV SOLN
INTRAVENOUS | Status: AC
  Filled 2016-07-06: qty 100

## 2016-07-06 MED ORDER — ACETAMINOPHEN 10 MG/ML IV SOLN
1000.0000 mg | INTRAVENOUS | Status: AC
  Administered 2016-07-06: 1000 mg via INTRAVENOUS
  Filled 2016-07-06: qty 100

## 2016-07-06 MED ORDER — LIDOCAINE 2% (20 MG/ML) 5 ML SYRINGE
INTRAMUSCULAR | Status: DC | PRN
  Administered 2016-07-06: 100 mg via INTRAVENOUS

## 2016-07-06 MED ORDER — SUCCINYLCHOLINE CHLORIDE 200 MG/10ML IV SOSY
PREFILLED_SYRINGE | INTRAVENOUS | Status: AC
  Filled 2016-07-06: qty 10

## 2016-07-06 MED ORDER — PROPOFOL 10 MG/ML IV BOLUS
INTRAVENOUS | Status: AC
  Filled 2016-07-06: qty 20

## 2016-07-06 MED ORDER — CEFAZOLIN SODIUM-DEXTROSE 2-3 GM-% IV SOLR
INTRAVENOUS | Status: DC | PRN
  Administered 2016-07-06: 2 g via INTRAVENOUS

## 2016-07-06 MED ORDER — SUGAMMADEX SODIUM 200 MG/2ML IV SOLN
INTRAVENOUS | Status: AC
  Filled 2016-07-06: qty 2

## 2016-07-06 MED ORDER — KETOROLAC TROMETHAMINE 30 MG/ML IJ SOLN: 30.0000 mg | Freq: Once | INTRAMUSCULAR | Status: DC | PRN

## 2016-07-06 MED ORDER — LIDOCAINE 2% (20 MG/ML) 5 ML SYRINGE
INTRAMUSCULAR | Status: AC
  Filled 2016-07-06: qty 5

## 2016-07-06 MED ORDER — SUGAMMADEX SODIUM 200 MG/2ML IV SOLN
INTRAVENOUS | Status: DC | PRN
  Administered 2016-07-06: 200 mg via INTRAVENOUS

## 2016-07-06 MED ORDER — METHOCARBAMOL 500 MG PO TABS: 500.0000 mg | ORAL_TABLET | Freq: Four times a day (QID) | ORAL | Status: DC | PRN

## 2016-07-06 MED ORDER — DEXAMETHASONE SODIUM PHOSPHATE 10 MG/ML IJ SOLN
INTRAMUSCULAR | Status: AC
Start: 2016-07-06 — End: 2016-07-06
  Filled 2016-07-06: qty 1

## 2016-07-06 MED ORDER — CHLORHEXIDINE GLUCONATE CLOTH 2 % EX PADS: 6.0000 | MEDICATED_PAD | Freq: Every day | CUTANEOUS | Status: DC

## 2016-07-06 MED ORDER — ONDANSETRON HCL 4 MG/2ML IJ SOLN
INTRAMUSCULAR | Status: AC
  Filled 2016-07-06: qty 2

## 2016-07-06 MED ORDER — DEXAMETHASONE SODIUM PHOSPHATE 10 MG/ML IJ SOLN
10.0000 mg | INTRAMUSCULAR | Status: AC
  Administered 2016-07-06: 10 mg via INTRAVENOUS
  Filled 2016-07-06: qty 1

## 2016-07-06 MED ORDER — ROCURONIUM BROMIDE 10 MG/ML (PF) SYRINGE
PREFILLED_SYRINGE | INTRAVENOUS | Status: DC | PRN
  Administered 2016-07-06: 30 mg via INTRAVENOUS
  Administered 2016-07-06 (×2): 10 mg via INTRAVENOUS

## 2016-07-06 MED ORDER — PHENYLEPHRINE 40 MCG/ML (10ML) SYRINGE FOR IV PUSH (FOR BLOOD PRESSURE SUPPORT)
PREFILLED_SYRINGE | INTRAVENOUS | Status: DC | PRN
  Administered 2016-07-06 (×2): 80 ug via INTRAVENOUS

## 2016-07-06 MED ORDER — SUCCINYLCHOLINE CHLORIDE 200 MG/10ML IV SOSY
PREFILLED_SYRINGE | INTRAVENOUS | Status: DC | PRN
  Administered 2016-07-06: 140 mg via INTRAVENOUS

## 2016-07-06 MED ORDER — LACTATED RINGERS IV SOLN
INTRAVENOUS | Status: DC
  Administered 2016-07-06 (×2): via INTRAVENOUS

## 2016-07-06 MED ORDER — FENTANYL CITRATE (PF) 100 MCG/2ML IJ SOLN: 25.0000 ug | INTRAMUSCULAR | Status: DC | PRN

## 2016-07-06 MED ORDER — CEFAZOLIN SODIUM-DEXTROSE 1-4 GM/50ML-% IV SOLN: 1.0000 g | Freq: Three times a day (TID) | INTRAVENOUS | Status: DC

## 2016-07-06 MED ORDER — FENTANYL CITRATE (PF) 100 MCG/2ML IJ SOLN
INTRAMUSCULAR | Status: DC | PRN
  Administered 2016-07-06: 100 ug via INTRAVENOUS
  Administered 2016-07-06 (×2): 50 ug via INTRAVENOUS

## 2016-07-06 SURGICAL SUPPLY — 65 items
BAG SPEC THK2 15X12 ZIP CLS (MISCELLANEOUS)
BAG ZIPLOCK 12X15 (MISCELLANEOUS) IMPLANT
BIT DRILL CALIBRATED 4.3MMX365 (DRILL) ×1 IMPLANT
BIT DRILL CROWE PNT TWST 4.5MM (DRILL) ×1 IMPLANT
CLOSURE WOUND 1/2 X4 (GAUZE/BANDAGES/DRESSINGS)
COVER SURGICAL LIGHT HANDLE (MISCELLANEOUS) ×3 IMPLANT
DRAPE C-ARM 42X120 X-RAY (DRAPES) ×3 IMPLANT
DRAPE C-ARMOR (DRAPES) ×3 IMPLANT
DRAPE INCISE IOBAN 66X45 STRL (DRAPES) ×3 IMPLANT
DRAPE ORTHO SPLIT 77X108 STRL (DRAPES) ×6
DRAPE POUCH INSTRU U-SHP 10X18 (DRAPES) ×3 IMPLANT
DRAPE SURG 17X11 SM STRL (DRAPES) ×3 IMPLANT
DRAPE SURG ORHT 6 SPLT 77X108 (DRAPES) ×2 IMPLANT
DRAPE U-SHAPE 47X51 STRL (DRAPES) ×3 IMPLANT
DRILL CALIBRATED 4.3MMX365 (DRILL) ×3
DRILL CROWE POINT TWIST 4.5MM (DRILL) ×3
DRSG EMULSION OIL 3X16 NADH (GAUZE/BANDAGES/DRESSINGS) IMPLANT
DRSG PAD ABDOMINAL 8X10 ST (GAUZE/BANDAGES/DRESSINGS) ×6 IMPLANT
DURAPREP 26ML APPLICATOR (WOUND CARE) ×3 IMPLANT
ELECT BLADE TIP CTD 4 INCH (ELECTRODE) IMPLANT
ELECT REM PT RETURN 15FT ADLT (MISCELLANEOUS) ×3 IMPLANT
EVACUATOR 1/8 PVC DRAIN (DRAIN) ×3 IMPLANT
FACESHIELD WRAPAROUND (MASK) ×12 IMPLANT
GAUZE SPONGE 4X4 12PLY STRL (GAUZE/BANDAGES/DRESSINGS) ×6 IMPLANT
GLOVE BIO SURGEON STRL SZ7.5 (GLOVE) ×3 IMPLANT
GLOVE BIO SURGEON STRL SZ8 (GLOVE) ×3 IMPLANT
GLOVE BIOGEL PI IND STRL 8 (GLOVE) ×1 IMPLANT
GLOVE BIOGEL PI INDICATOR 8 (GLOVE) ×2
GLOVE ORTHO TXT STRL SZ7.5 (GLOVE) ×3 IMPLANT
GOWN STRL REUS W/TWL LRG LVL3 (GOWN DISPOSABLE) ×3 IMPLANT
GOWN STRL REUS W/TWL XL LVL3 (GOWN DISPOSABLE) ×3 IMPLANT
GUIDEPIN 3.2X17.5 THRD DISP (PIN) ×3 IMPLANT
GUIDEWIRE BEAD TIP (WIRE) ×3 IMPLANT
IMMOBILIZER KNEE 20 (SOFTGOODS)
IMMOBILIZER KNEE 20 THIGH 36 (SOFTGOODS) IMPLANT
KIT BASIN OR (CUSTOM PROCEDURE TRAY) ×3 IMPLANT
MANIFOLD NEPTUNE II (INSTRUMENTS) ×3 IMPLANT
NAIL FEM RETRO 10.5X380 (Nail) ×3 IMPLANT
NS IRRIG 1000ML POUR BTL (IV SOLUTION) ×3 IMPLANT
PACK TOTAL JOINT (CUSTOM PROCEDURE TRAY) ×3 IMPLANT
PADDING CAST COTTON 6X4 STRL (CAST SUPPLIES) ×6 IMPLANT
PASSER SUT SWANSON 36MM LOOP (INSTRUMENTS) IMPLANT
POSITIONER SURGICAL ARM (MISCELLANEOUS) ×3 IMPLANT
SCREW CORT TI DBL LEAD 5X34 (Screw) ×3 IMPLANT
SCREW CORT TI DBL LEAD 5X56 (Screw) ×3 IMPLANT
SCREW CORT TI DBL LEAD 5X60 (Screw) ×3 IMPLANT
SCREW CORT TI DBL LEAD 5X70 (Screw) ×3 IMPLANT
SCREW CORT TI DBL LEAD 5X80 (Screw) ×3 IMPLANT
SPONGE LAP 18X18 X RAY DECT (DISPOSABLE) IMPLANT
SPONGE LAP 4X18 X RAY DECT (DISPOSABLE) IMPLANT
STAPLER VISISTAT 35W (STAPLE) IMPLANT
STRIP CLOSURE SKIN 1/2X4 (GAUZE/BANDAGES/DRESSINGS) IMPLANT
SUCTION FRAZIER HANDLE 10FR (MISCELLANEOUS) ×2
SUCTION TUBE FRAZIER 10FR DISP (MISCELLANEOUS) ×1 IMPLANT
SUT ETHIBOND NAB CT1 #1 30IN (SUTURE) IMPLANT
SUT MNCRL AB 4-0 PS2 18 (SUTURE) IMPLANT
SUT STRATAFIX 0 PDS 27 VIOLET (SUTURE) ×3
SUT VIC AB 1 CT1 27 (SUTURE) ×6
SUT VIC AB 1 CT1 27XBRD ANTBC (SUTURE) ×3 IMPLANT
SUT VIC AB 2-0 CT1 27 (SUTURE) ×6
SUT VIC AB 2-0 CT1 TAPERPNT 27 (SUTURE) ×3 IMPLANT
SUTURE STRATFX 0 PDS 27 VIOLET (SUTURE) ×1 IMPLANT
TOWEL OR 17X26 10 PK STRL BLUE (TOWEL DISPOSABLE) ×6 IMPLANT
TRAY FOLEY W/METER SILVER 16FR (SET/KITS/TRAYS/PACK) ×3 IMPLANT
WATER STERILE IRR 1500ML POUR (IV SOLUTION) ×6 IMPLANT

## 2016-07-06 NOTE — Anesthesia Procedure Notes (Signed)
Procedure Name: Intubation Date/Time: 07/06/2016 5:01 PM Performed by: Danley Danker L Patient Re-evaluated:Patient Re-evaluated prior to inductionOxygen Delivery Method: Circle system utilized Preoxygenation: Pre-oxygenation with 100% oxygen Intubation Type: IV induction Ventilation: Mask ventilation without difficulty Laryngoscope Size: Miller and 2 Grade View: Grade I Tube type: Oral Tube size: 7.5 mm Number of attempts: 1 Airway Equipment and Method: Stylet Placement Confirmation: ETT inserted through vocal cords under direct vision,  positive ETCO2 and breath sounds checked- equal and bilateral Secured at: 23 cm Tube secured with: Tape Dental Injury: Teeth and Oropharynx as per pre-operative assessment

## 2016-07-06 NOTE — Op Note (Signed)
[name], [name] NO.:  [id number]  MEDICAL RECORD NO.:  [id number]  LOCATION:  WA16                         FACILITY:  [facility]  PHYSICIAN:  Gaynelle Arabian, M.D.    DATE OF BIRTH:  06-25-1940  DATE OF PROCEDURE:  07/06/2016 DATE OF DISCHARGE:                              OPERATIVE REPORT   PREOPERATIVE DIAGNOSIS:  Right distal femoral periprosthetic fracture.  POSTOPERATIVE DIAGNOSIS:  Right distal femoral periprosthetic fracture.  PROCEDURE:  Open reduction and internal fixation of right distal femoral periprosthetic fracture.  SURGEON:  Gaynelle Arabian, M.D.  ASSIST:  Alexzandrew L. Perkins, P.A.C.  ANESTHESIA:  General.  ESTIMATED BLOOD LOSS:  50 mL.  DRAINS:  None.  COMPLICATIONS:  None.  CONDITION:  Stable to recovery.  BRIEF CLINICAL NOTE:  [name] is a 76 year old female, who had a fall at [facility] two days ago leaning forward and sliding out of her chair landing directly on her right knee.  She sustained a distal femoral periprosthetic fracture above the total knee.  She presents now for open reduction and internal fixation.  PROCEDURE IN DETAIL:  After successful administration of general anesthetic, her right lower extremity was isolated from perineum with plastic drapes and then prepped and draped in the usual sterile fashion. A sterile tourniquet was then placed around the thigh and inflated to 300 mmHg.  Midline incision was made utilizing the previous total knee incision, skin cut with a 10-blade through subcutaneous tissue to the level of the extensor mechanism.  A fresh blade was used to make a medial parapatellar arthrotomy.  The fracture site identified and the distal femur identified.  I removed the plastic plug from the box of the Sigma RP femoral component.  We then drilled to drill the cement plug out.  I then passed the guidewire for the Biomet retrograde femoral nail through the box of the femoral  component into the proximal femur up to the level of the lesser trochanter.  The length was measured to be 36 cm.  We reamed over this up to 12 mm for placement of a 10.5 mm diameter retrograde femoral nail.  The nail was assembled and then passed over the guidewire.  I reduced the fracture with direct visualization as well as under C-arm fluoroscopic guidance in the AP and lateral planes.  It was held to reduce and the nail was passed.  This was close to an anatomic reduction as we were very pleased with the reduction.  The drill sleeves were then passed through the holes in the external guide in order to place the distal interlocks.  A total of 4 distal interlocks were placed.  This was distally in the femur, but proximal in the nail. We had good purchase on these screws.  I then utilized a freehand technique to place the other interlock screw, which was distal in the nail, but proximal in the femur.  Small incision was made and under freehand technique, I drilled through the dynamic hole of the nail and placed a 34-mm screw, which proved to be a good length on the lateral view.  It was through the nail on the AP view.  Multiple fluoro spots were  taken showing excellent reduction of this fracture, excellent placement of hardware.  All the wounds were then copiously irrigated with saline solution.  Tourniquet was then released, total time was 54 minutes.  The arthrotomy was closed with a running #1 Stratafix suture. Subcu for both incisions was closed with interrupted 2-0 Vicryl and then skin closed with staples.  Incisions were cleaned and dried and bulky sterile dressing was applied.  She was then placed into a knee immobilizer, awakened and transported to recovery in stable condition. Please note that a surgical assistant was a medical necessity for this procedure to do it in a safe and expeditious manner.  Surgical assistant was necessary for retraction of vital neurovascular  structures and ligaments and also for proper reduction of the fracture during and after placement of the hardware.     Gaynelle Arabian, M.D.     FA/MEDQ  D:  07/06/2016  T:  07/06/2016  Job:  [id number]

## 2016-07-06 NOTE — Brief Op Note (Signed)
07/04/2016 - 07/06/2016  6:39 PM  PATIENT:  [name]  76 y.o. female  PRE-OPERATIVE DIAGNOSIS:  Right distal femur periprosthetic fracture   POST-OPERATIVE DIAGNOSIS:  right distal femur periprosthetic fracture  PROCEDURE:  Procedure(s): OPEN REDUCTION INTERNAL FIXATION (ORIF) PERIPROSTHETIC FRACTURE (Right)  SURGEON:  Surgeon(s) and Role:    Gaynelle Arabian, MD - Primary  PHYSICIAN ASSISTANT:   ASSISTANTS: Arlee Muslim, PA-C   ANESTHESIA:   general  EBL:  Total I/O In: 1000 [I.V.:1000] Out: 850 [Urine:800; Blood:50]  BLOOD ADMINISTERED:none  DRAINS: none   LOCAL MEDICATIONS USED:  NONE  COUNTS:  YES  TOURNIQUET:   Total Tourniquet Time Documented: Thigh (Right) - 54 minutes Total: Thigh (Right) - 54 minutes   DICTATION: .Other Dictation: Dictation Number [id number]  PLAN OF CARE: Admit to inpatient   PATIENT DISPOSITION:  PACU - hemodynamically stable.

## 2016-07-06 NOTE — Progress Notes (Signed)
Initial Nutrition Assessment  DOCUMENTATION CODES:   Obesity unspecified  INTERVENTION:   Diet advancement per MD Upon diet advancement, provide Ensure Enlive po BID, each supplement provides 350 kcal and 20 grams of protein RD to continue to monitor  NUTRITION DIAGNOSIS:   Increased nutrient needs related to other (see comment) (periprosthetic fracture) as evidenced by estimated needs.  GOAL:   Patient will meet greater than or equal to 90% of their needs  MONITOR:   Diet advancement, Labs, Weight trends, I & O's  REASON FOR ASSESSMENT:   Low Braden    ASSESSMENT:   75 y.o. female with a past medical history significant for hypertension, diabetes, GERD, and prior right knee replacement who presents with a fall and right leg pain. Patient reports that she was sitting in a chair and reach for a folder when she fell out of the chair onto her right leg. Patient denied hitting her head. She denied loss of consciousness.   Patient expected to have ORIF this afternoon for periprosthetic fracture. Pt with history of swallowing difficulties per chart review. Pt currently c/o difficulty swallowing pills. Prior to NPO status, pt consumed 45-50% of meals 5/15. Pt would benefit from nutritional supplementation after surgery. Will order once diet is advanced.  Per chart review, pt with recent weight gain. Nutrition focused physical exam shows no sign of depletion of muscle mass or body fat.  Medications: K-DUR tablet daily, Zofran tablet PRN Labs reviewed: CBGs: 248-316  Diet Order:  Diet NPO time specified Except for: Sips with Meds  Skin:  Reviewed, no issues  Last BM:  5/14  Height:   Ht Readings from Last 1 Encounters:  07/06/16 5\' 7"  (1.702 m)    Weight:   Wt Readings from Last 1 Encounters:  07/04/16 210 lb (95.3 kg)    Ideal Body Weight:  61.4 kg  BMI:  Body mass index is 32.89 kg/m.  Estimated Nutritional Needs:   Kcal:  0051-1021  Protein:   75-85g  Fluid:  1.9L/day  EDUCATION NEEDS:   No education needs identified at this time  Clayton Bibles, MS, RD, LDN Pager: [phone] After Hours Pager: [phone]

## 2016-07-06 NOTE — Anesthesia Postprocedure Evaluation (Addendum)
Anesthesia Post Note  Patient: [name]  Procedure(s) Performed: Procedure(s) (LRB): OPEN REDUCTION INTERNAL FIXATION (ORIF) PERIPROSTHETIC FRACTURE (Right)  Patient location during evaluation: PACU Anesthesia Type: General Level of consciousness: awake Pain management: pain level controlled Vital Signs Assessment: post-procedure vital signs reviewed and stable Respiratory status: spontaneous breathing Cardiovascular status: stable Postop Assessment: no signs of nausea or vomiting Anesthetic complications: no        Last Vitals:  Vitals:   07/06/16 1930 07/06/16 1945  BP: (!) 149/99 (!) 151/80  Pulse: 85   Resp: (!) 22   Temp:  36.9 C    Last Pain:  Vitals:   07/06/16 1915  TempSrc:   PainSc: Asleep   Pain Goal: Patients Stated Pain Goal: 2 (07/05/16 0556)        RLE Sensation: Full sensation;No numbness;No pain;No tingling (07/06/16 1945)      Justice

## 2016-07-06 NOTE — H&P (View-Only) (Signed)
   Subjective: Hospital day - 1 Patient reports pain as moderate.   Patient seen in rounds with Dr. Wynelle Link.  Briefly discussed events leading to admission.  Informed Dr. Wynelle Link that she fell out of her chair at home directly onto the right knee sustaining impact injury.  She was brought to the [facility] ED where xrays proved positive for a periprosthetic distal femur fracture.  She was admitted and ortho services were consulted.  Dr. Tonita Cong who was covering for Dr. Wynelle Link was consulted.  Radiographs have been reviewed and it is felt that she would benefit from undergoing surgery.  Will plan for procedure tomorrow afternoon.  She may eat today and then clear liquids tomorrow morning. She will be made NPO tomorrow after breakfast. Patient is having problems with pain in the knee, requiring pain medications  Objective: Vital signs in last 24 hours: Temp:  [98.1 F (36.7 C)-98.5 F (36.9 C)] 98.5 F (36.9 C) (05/15 0545) Pulse Rate:  [70-79] 72 (05/15 0545) Resp:  [0-27] 18 (05/15 0545) BP: (112-174)/(64-108) 123/85 (05/15 0545) SpO2:  [96 %-100 %] 97 % (05/15 0545) Weight:  [95.3 kg (210 lb)] 95.3 kg (210 lb) (05/14 1711)  Intake/Output from previous day:  Intake/Output Summary (Last 24 hours) at 07/05/16 0903 Last data filed at 07/05/16 0551  Gross per 24 hour  Intake              375 ml  Output              725 ml  Net             -350 ml    Intake/Output this shift: No intake/output data recorded.  Labs:  Recent Labs  07/04/16 1828 07/05/16 0630  HGB 12.8 11.8*    Recent Labs  07/04/16 1828 07/05/16 0630  WBC 14.7* 12.2*  RBC 4.84 4.62  HCT 38.5 37.0  PLT 267 277    Recent Labs  07/04/16 1828 07/05/16 0630  NA 140 141  K 3.7 3.7  CL 104 105  CO2 27 25  BUN 19 21*  CREATININE 0.80 0.91  GLUCOSE 285* 373*  CALCIUM 9.3 9.4    Recent Labs  07/04/16 1828 07/05/16 0630  INR 0.95 1.04    EXAM General - Patient is Alert and Appropriate Extremity -  Neurovascular intact Sensation intact distally Motor Function - intact, moving foot and toes well on exam.   Past Medical History:  Diagnosis Date  . Anxiety   . Depression   . Diabetes mellitus   . Dyspnea   . GERD (gastroesophageal reflux disease)   . Headache(784.0)   . HTN (hypertension)    dr Johnsie Cancel  . Hypercholesteremia    DENIES  . Osteoarthritis   . PONV (postoperative nausea and vomiting)   . RLS (restless legs syndrome)   . 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   Weight as of this encounter: 95.3 kg (210 lb).  Bedrest NPO after liquid breakfast tomorrow.  May have regular diet today. Consent for surgery. Recheck labs in the morning.  Arlee Muslim, PA-C Orthopaedic Surgery 07/05/2016, 9:03 AM

## 2016-07-06 NOTE — Transfer of Care (Signed)
Immediate Anesthesia Transfer of Care Note  Patient: [name]  Procedure(s) Performed: Procedure(s): OPEN REDUCTION INTERNAL FIXATION (ORIF) PERIPROSTHETIC FRACTURE (Right)  Patient Location: PACU  Anesthesia Type:General  Level of Consciousness:  sedated, patient cooperative and responds to stimulation  Airway & Oxygen Therapy:Patient Spontanous Breathing and Patient connected to face mask oxgen  Post-op Assessment:  Report given to PACU RN and Post -op Vital signs reviewed and stable  Post vital signs:  Reviewed and stable  Last Vitals:  Vitals:   07/05/16 1833 07/06/16 0445  BP: (!) 174/90 (!) 183/82  Pulse: 77 98  Resp: 20 20  Temp: 36.8 C 08.8 C    Complications: No apparent anesthesia complications

## 2016-07-06 NOTE — Progress Notes (Signed)
Inpatient Diabetes Program Recommendations  AACE/ADA: New Consensus Statement on Inpatient Glycemic Control (2015)  Target Ranges:  Prepandial:   less than 140 mg/dL      Peak postprandial:   less than 180 mg/dL (1-2 hours)      Critically ill patients:  140 - 180 mg/dL   Lab Results  Component Value Date   GLUCAP 248 (H) 07/06/2016   HGBA1C 9.1 (H) 07/10/2014    Review of Glycemic ControlResults for MARSIA, CINO (MRN [id number]) as of 07/06/2016 13:18  Ref. Range 07/05/2016 12:20 07/05/2016 16:52 07/05/2016 21:03 07/06/2016 07:32 07/06/2016 12:07  Glucose-Capillary Latest Ref Range: 65 - 99 mg/dL 285 (H) 299 (H) 256 (H) 316 (H) 248 (H)   Diabetes history: Type 2 diabetes Outpatient Diabetes medications: Levemir 12 units q AM and 46 units q HS (per medication reconciliation),  Novolog sensitive tid with meals Current orders for Inpatient glycemic control:  Levemir 12 units q AM and Levemir 5 units q PM, Novolog moderate tid with meals  Inpatient Diabetes Program Recommendations:   Please consider increasing Novolog correction to q 4 hours while NPO.  Further consider increasing Levemir to 20 units q HS (per medication reconciliation, patient was taking Levemir 46 units q HS prior to admit).    Thanks, Adah Perl, RN, BC-ADM Inpatient Diabetes Coordinator Pager [phone] (8a-5p)

## 2016-07-06 NOTE — Interval H&P Note (Signed)
History and Physical Interval Note:  07/06/2016 4:46 PM  [name]  has presented today for surgery, with the diagnosis of Right distal femur periprosthetic fracture   The various methods of treatment have been discussed with the patient and family. After consideration of risks, benefits and other options for treatment, the patient has consented to  Procedure(s): OPEN REDUCTION INTERNAL FIXATION (ORIF) PERIPROSTHETIC FRACTURE (Right) as a surgical intervention .  The patient's history has been reviewed, patient examined, no change in status, stable for surgery.  I have reviewed the patient's chart and labs.  Questions were answered to the patient's satisfaction.     [name]

## 2016-07-06 NOTE — Anesthesia Preprocedure Evaluation (Addendum)
Anesthesia Evaluation  Patient identified by MRN, date of birth, ID band Patient awake    Reviewed: Allergy & Precautions, NPO status , Patient's Chart, lab work & pertinent test results  Airway Mallampati: II       Dental no notable dental hx. (+) Teeth Intact   Pulmonary    Pulmonary exam normal        Cardiovascular hypertension, Pt. on medications Normal cardiovascular exam Rhythm:Regular Rate:Normal     Neuro/Psych    GI/Hepatic Neg liver ROS, GERD  Medicated,  Endo/Other  diabetes, Type 2, Oral Hypoglycemic Agents  Renal/GU   negative genitourinary   Musculoskeletal   Abdominal Normal abdominal exam  (+)   Peds  Hematology  (+) Blood dyscrasia, anemia ,   Anesthesia Other Findings   Reproductive/Obstetrics                            Anesthesia Physical Anesthesia Plan  ASA: III  Anesthesia Plan: General   Post-op Pain Management:    Induction: Intravenous  Airway Management Planned: LMA  Additional Equipment:   Intra-op Plan:   Post-operative Plan:   Informed Consent: I have reviewed the patients History and Physical, chart, labs and discussed the procedure including the risks, benefits and alternatives for the proposed anesthesia with the patient or authorized representative who has indicated his/her understanding and acceptance.     Plan Discussed with: CRNA and Surgeon  Anesthesia Plan Comments:        Anesthesia Quick Evaluation

## 2016-07-07 ENCOUNTER — Encounter (HOSPITAL_COMMUNITY): Payer: Self-pay | Admitting: Orthopedic Surgery

## 2016-07-07 LAB — GLUCOSE, CAPILLARY
GLUCOSE-CAPILLARY: 351 mg/dL — AB (ref 65–99)
GLUCOSE-CAPILLARY: 265 mg/dL — AB (ref 65–99)
GLUCOSE-CAPILLARY: 340 mg/dL — AB (ref 65–99)
Glucose-Capillary: 162 mg/dL — ABNORMAL HIGH (ref 65–99)
Glucose-Capillary: 370 mg/dL — ABNORMAL HIGH (ref 65–99)

## 2016-07-07 MED ORDER — ENOXAPARIN SODIUM 40 MG/0.4ML ~~LOC~~ SOLN
40.0000 mg | SUBCUTANEOUS | Status: DC
  Administered 2016-07-07 – 2016-07-08 (×2): 40 mg via SUBCUTANEOUS
  Filled 2016-07-07 (×2): qty 0.4

## 2016-07-07 MED ORDER — CEFAZOLIN SODIUM-DEXTROSE 2-4 GM/100ML-% IV SOLN
2.0000 g | INTRAVENOUS | Status: AC
  Administered 2016-07-07 (×3): 2 g via INTRAVENOUS
  Filled 2016-07-07 (×3): qty 100

## 2016-07-07 MED ORDER — METOCLOPRAMIDE HCL 5 MG PO TABS: 5.0000 mg | ORAL_TABLET | Freq: Three times a day (TID) | ORAL | Status: DC | PRN

## 2016-07-07 MED ORDER — METOCLOPRAMIDE HCL 5 MG/ML IJ SOLN: 5.0000 mg | Freq: Three times a day (TID) | INTRAMUSCULAR | Status: DC | PRN

## 2016-07-07 MED ORDER — HYDROMORPHONE HCL 2 MG PO TABS: 2.0000 mg | ORAL_TABLET | ORAL | Status: DC | PRN

## 2016-07-07 MED ORDER — ONDANSETRON HCL 4 MG PO TABS: 4.0000 mg | ORAL_TABLET | Freq: Four times a day (QID) | ORAL | Status: DC | PRN

## 2016-07-07 MED ORDER — ENSURE ENLIVE PO LIQD
237.0000 mL | Freq: Two times a day (BID) | ORAL | Status: DC
  Administered 2016-07-07 – 2016-07-08 (×2): 237 mL via ORAL

## 2016-07-07 MED ORDER — ONDANSETRON HCL 4 MG/2ML IJ SOLN: 4.0000 mg | Freq: Four times a day (QID) | INTRAMUSCULAR | Status: DC | PRN

## 2016-07-07 NOTE — Progress Notes (Signed)
CSW following for discharge needs back to long term [facility].  Kathrin Greathouse, Latanya Presser, MSW Clinical Social Worker 5E and Psychiatric Service Line [phone] 07/07/2016  8:19 AM

## 2016-07-07 NOTE — Progress Notes (Signed)
Inpatient Diabetes Program Recommendations  AACE/ADA: New Consensus Statement on Inpatient Glycemic Control (2015)  Target Ranges:  Prepandial:   less than 140 mg/dL      Peak postprandial:   less than 180 mg/dL (1-2 hours)      Critically ill patients:  140 - 180 mg/dL   Lab Results  Component Value Date   GLUCAP 370 (H) 07/07/2016   HGBA1C 9.1 (H) 07/10/2014    Review of Glycemic Control  FBS - 370 mg/dL On Levemir 46 units QHS at home.    Inpatient Diabetes Program Recommendations:   Increase Levemir to 24 units QHS  Will continue to follow.  Thank you. Lorenda Peck, RD, LDN, CDE Inpatient Diabetes Coordinator [phone]

## 2016-07-07 NOTE — Progress Notes (Signed)
   Subjective: 1 Day Post-Op Procedure(s) (LRB): OPEN REDUCTION INTERNAL FIXATION (ORIF) PERIPROSTHETIC FRACTURE (Right) Patient reports pain as mild and moderate.   Patient seen in rounds by Dr. Wynelle Link. Patient is having problems with pain in the knee, requiring pain medications We will start therapy today.  Plan is to go Skilled nursing facility after hospital stay.  She was at [facility]. Will get Education officer, museum involved.  Objective: Vital signs in last 24 hours: Temp:  [97.4 F (36.3 C)-98.9 F (37.2 C)] 98.4 F (36.9 C) (05/17 0510) Pulse Rate:  [72-98] 72 (05/17 0510) Resp:  [16-22] 18 (05/17 0510) BP: (143-176)/(74-134) 168/83 (05/17 0510) SpO2:  [94 %-100 %] 94 % (05/17 0510)  Intake/Output from previous day: 05/16 0701 - 05/17 0700 In: 1850 [I.V.:1800; IV Piggyback:50] Out: 1200 [Urine:1150; Blood:50] Intake/Output this shift: No intake/output data recorded.   Recent Labs  07/04/16 1828 07/05/16 0630 07/06/16 0645  HGB 12.8 11.8* 10.9*    Recent Labs  07/05/16 0630 07/06/16 0645  WBC 12.2* 13.1*  RBC 4.62 4.17  HCT 37.0 33.8*  PLT 277 256    Recent Labs  07/05/16 0630 07/06/16 0645  NA 141 141  K 3.7 4.0  CL 105 106  CO2 25 24  BUN 21* 22*  CREATININE 0.91 0.98  GLUCOSE 373* 329*  CALCIUM 9.4 9.2    Recent Labs  07/04/16 1828 07/05/16 0630  INR 0.95 1.04    EXAM General - Patient is Alert Extremity - Neurovascular intact Sensation intact distally Dressing - dressing C/D/I Motor Function - intact, moving foot and toes well on exam.   Past Medical History:  Diagnosis Date  . Anxiety   . Depression   . Diabetes mellitus   . Dyspnea   . GERD (gastroesophageal reflux disease)   . Headache(784.0)   . HTN (hypertension)    dr Johnsie Cancel  . Hypercholesteremia    DENIES  . Osteoarthritis   . PONV (postoperative nausea and vomiting)   . RLS (restless legs syndrome)   . 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   Weight as of this encounter: 95.3 kg (210 lb). Advance diet Up with therapy Plan for discharge tomorrow if doing well and stable  TDWB to right leg Therapy - may do passive and active ROM to the knee out of the brace.  Needs brace when up ambulating.  Plan to go back to [facility] tomorrow if doing well.  DVT Prophylaxis - Lovenox Touch Down Weight-Bearing to right leg D/C O2 and Pulse OX and try on Room Air Social Worker consult  Arlee Muslim, PA-C Orthopaedic Surgery 07/07/2016, 8:52 AM

## 2016-07-07 NOTE — Progress Notes (Signed)
This RN went into the room to do her assessment. I checked the patients urine output and insertion of catheter. Bedpad appeared to look stained with urine. Checked to make sure catheter was intact. Catheter was not inserted anymore and balloon was still inflated. MD was paged. Awaiting any new orders.

## 2016-07-07 NOTE — Evaluation (Signed)
Physical Therapy Evaluation Patient Details Name: [name] MRN: [id number] DOB: [birth year] Today's Date: 07/07/2016   History of Present Illness  76 yo female admitted after falling at SNF. S/P ORIF R knee periprosthetic fx 07/06/16. Hx of dementia, DM, gait instability.    Clinical Impression  On eval, pt required Max assist +2 for bed mobility. After sitting EOB for a few minutes, pt c/o nausea and lightheadedness. Assisted pt back to supine. Pt reported feeling better once lying down. No family present during session. Recommend return to SNF.     Follow Up Recommendations SNF    Equipment Recommendations  None recommended by PT    Recommendations for Other Services       Precautions / Restrictions Precautions Precautions: Fall Required Braces or Orthoses: Knee Immobilizer - Right Knee Immobilizer - Right: On when out of bed or walking Restrictions Weight Bearing Restrictions: Yes RLE Weight Bearing: Touchdown weight bearing Other Position/Activity Restrictions: Brace when OOB/ambulating. Knee ROM OK.      Mobility  Bed Mobility Overal bed mobility: Needs Assistance Bed Mobility: Supine to Sit;Sit to Supine     Supine to sit: Max assist;+2 for physical assistance;+2 for safety/equipment;HOB elevated Sit to supine: Max assist;+2 for physical assistance;+2 for safety/equipment;HOB elevated   General bed mobility comments: assist for trunk and bil LEs. Utilized bedpad for scooting, positionoing. After sitting for a few minutes, pt c/o nausea, lightheadedness. Assisted pt back to supine.   Transfers                 General transfer comment: NT-pt experiencing nausea, lightheadedness.   Ambulation/Gait                Stairs            Wheelchair Mobility    Modified Rankin (Stroke Patients Only)       Balance Overall balance assessment: Needs assistance;History of Falls Sitting-balance support: Feet supported;Bilateral upper extremity  supported Sitting balance-Leahy Scale: Fair                                       Pertinent Vitals/Pain Pain Assessment: Faces Faces Pain Scale: Hurts whole lot Pain Location: R knee/LE with activity Pain Descriptors / Indicators: Grimacing;Aching Pain Intervention(s): Limited activity within patient's tolerance;Repositioned    Home Living Family/patient expects to be discharged to:: Skilled nursing facility                      Prior Function Level of Independence: Needs assistance   Gait / Transfers Assistance Needed: per RN, transfers only. Pt uses wheelchair primarily.           Hand Dominance        Extremity/Trunk Assessment   Upper Extremity Assessment Upper Extremity Assessment: Generalized weakness    Lower Extremity Assessment Lower Extremity Assessment: Generalized weakness;RLE deficits/detail RLE Deficits / Details: NT-immobilizer in place    Cervical / Trunk Assessment Cervical / Trunk Assessment: Normal  Communication   Communication: No difficulties  Cognition Arousal/Alertness: Awake/alert Behavior During Therapy: WFL for tasks assessed/performed Overall Cognitive Status: History of cognitive impairments - at baseline                                        General Comments  Exercises     Assessment/Plan    PT Assessment Patient needs continued PT services  PT Problem List Decreased strength;Decreased mobility;Decreased activity tolerance;Decreased balance;Decreased knowledge of use of DME;Pain;Decreased cognition;Decreased knowledge of precautions       PT Treatment Interventions DME instruction;Therapeutic activities;Gait training;Therapeutic exercise;Patient/family education;Functional mobility training;Balance training    PT Goals (Current goals can be found in the Care Plan section)  Acute Rehab PT Goals Patient Stated Goal: none stated PT Goal Formulation: Patient unable to participate  in goal setting Time For Goal Achievement: 07/21/16 Potential to Achieve Goals: Fair    Frequency Min 3X/week   Barriers to discharge        Co-evaluation               AM-PAC PT "6 Clicks" Daily Activity  Outcome Measure Difficulty turning over in bed (including adjusting bedclothes, sheets and blankets)?: A Lot Difficulty moving from lying on back to sitting on the side of the bed? : A Lot Difficulty sitting down on and standing up from a chair with arms (e.g., wheelchair, bedside commode, etc,.)?: Total Help needed moving to and from a bed to chair (including a wheelchair)?: Total Help needed walking in hospital room?: Total Help needed climbing 3-5 steps with a railing? : Total 6 Click Score: 8    End of Session   Activity Tolerance: Patient limited by fatigue;Patient limited by pain (Limited by nausea) Patient left: in bed;with call bell/phone within reach;with bed alarm set   PT Visit Diagnosis: Muscle weakness (generalized) (M62.81);Difficulty in walking, not elsewhere classified (R26.2)    Time: 1696-7893 PT Time Calculation (min) (ACUTE ONLY): 17 min   Charges:   PT Evaluation $PT Eval Moderate Complexity: 1 Procedure     PT G Codes:          Weston Anna, MPT Pager: [phone]

## 2016-07-08 DIAGNOSIS — F418 Other specified anxiety disorders: Secondary | ICD-10-CM | POA: Diagnosis not present

## 2016-07-08 DIAGNOSIS — Z87440 Personal history of urinary (tract) infections: Secondary | ICD-10-CM | POA: Diagnosis not present

## 2016-07-08 DIAGNOSIS — E039 Hypothyroidism, unspecified: Secondary | ICD-10-CM | POA: Diagnosis not present

## 2016-07-08 DIAGNOSIS — K5909 Other constipation: Secondary | ICD-10-CM | POA: Diagnosis not present

## 2016-07-08 DIAGNOSIS — G2581 Restless legs syndrome: Secondary | ICD-10-CM | POA: Diagnosis present

## 2016-07-08 DIAGNOSIS — R0602 Shortness of breath: Secondary | ICD-10-CM | POA: Diagnosis not present

## 2016-07-08 DIAGNOSIS — S72141S Displaced intertrochanteric fracture of right femur, sequela: Secondary | ICD-10-CM | POA: Diagnosis not present

## 2016-07-08 DIAGNOSIS — F329 Major depressive disorder, single episode, unspecified: Secondary | ICD-10-CM | POA: Diagnosis present

## 2016-07-08 DIAGNOSIS — N3080 Other cystitis without hematuria: Secondary | ICD-10-CM | POA: Diagnosis present

## 2016-07-08 DIAGNOSIS — Z66 Do not resuscitate: Secondary | ICD-10-CM | POA: Diagnosis present

## 2016-07-08 DIAGNOSIS — Z96651 Presence of right artificial knee joint: Secondary | ICD-10-CM | POA: Diagnosis not present

## 2016-07-08 DIAGNOSIS — R1110 Vomiting, unspecified: Secondary | ICD-10-CM | POA: Diagnosis not present

## 2016-07-08 DIAGNOSIS — E559 Vitamin D deficiency, unspecified: Secondary | ICD-10-CM | POA: Diagnosis not present

## 2016-07-08 DIAGNOSIS — R531 Weakness: Secondary | ICD-10-CM | POA: Diagnosis not present

## 2016-07-08 DIAGNOSIS — R410 Disorientation, unspecified: Secondary | ICD-10-CM | POA: Diagnosis not present

## 2016-07-08 DIAGNOSIS — N390 Urinary tract infection, site not specified: Secondary | ICD-10-CM | POA: Diagnosis not present

## 2016-07-08 DIAGNOSIS — F0280 Dementia in other diseases classified elsewhere without behavioral disturbance: Secondary | ICD-10-CM | POA: Diagnosis not present

## 2016-07-08 DIAGNOSIS — K3184 Gastroparesis: Secondary | ICD-10-CM | POA: Diagnosis not present

## 2016-07-08 DIAGNOSIS — F419 Anxiety disorder, unspecified: Secondary | ICD-10-CM | POA: Diagnosis present

## 2016-07-08 DIAGNOSIS — E785 Hyperlipidemia, unspecified: Secondary | ICD-10-CM | POA: Diagnosis not present

## 2016-07-08 DIAGNOSIS — N179 Acute kidney failure, unspecified: Secondary | ICD-10-CM | POA: Diagnosis present

## 2016-07-08 DIAGNOSIS — R278 Other lack of coordination: Secondary | ICD-10-CM | POA: Diagnosis not present

## 2016-07-08 DIAGNOSIS — Z9889 Other specified postprocedural states: Secondary | ICD-10-CM | POA: Diagnosis not present

## 2016-07-08 DIAGNOSIS — G9340 Encephalopathy, unspecified: Secondary | ICD-10-CM | POA: Diagnosis not present

## 2016-07-08 DIAGNOSIS — Z90710 Acquired absence of both cervix and uterus: Secondary | ICD-10-CM | POA: Diagnosis not present

## 2016-07-08 DIAGNOSIS — M9711XA 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, initial encounter: Secondary | ICD-10-CM | POA: Diagnosis not present

## 2016-07-08 DIAGNOSIS — S728X9A Other fracture of unspecified femur, initial encounter for closed fracture: Secondary | ICD-10-CM | POA: Diagnosis not present

## 2016-07-08 DIAGNOSIS — R109 Unspecified abdominal pain: Secondary | ICD-10-CM | POA: Diagnosis not present

## 2016-07-08 DIAGNOSIS — Z833 Family history of diabetes mellitus: Secondary | ICD-10-CM | POA: Diagnosis not present

## 2016-07-08 DIAGNOSIS — E872 Acidosis: Secondary | ICD-10-CM | POA: Diagnosis present

## 2016-07-08 DIAGNOSIS — E1122 Type 2 diabetes mellitus with diabetic chronic kidney disease: Secondary | ICD-10-CM | POA: Diagnosis present

## 2016-07-08 DIAGNOSIS — E46 Unspecified protein-calorie malnutrition: Secondary | ICD-10-CM | POA: Diagnosis present

## 2016-07-08 DIAGNOSIS — F0390 Unspecified dementia without behavioral disturbance: Secondary | ICD-10-CM | POA: Diagnosis present

## 2016-07-08 DIAGNOSIS — S72401D Unspecified fracture of lower end of right femur, subsequent encounter for closed fracture with routine healing: Secondary | ICD-10-CM | POA: Diagnosis not present

## 2016-07-08 DIAGNOSIS — J9811 Atelectasis: Secondary | ICD-10-CM | POA: Diagnosis present

## 2016-07-08 DIAGNOSIS — E1100 Type 2 diabetes mellitus with hyperosmolarity without nonketotic hyperglycemic-hyperosmolar coma (NKHHC): Secondary | ICD-10-CM | POA: Diagnosis not present

## 2016-07-08 DIAGNOSIS — E860 Dehydration: Secondary | ICD-10-CM | POA: Diagnosis present

## 2016-07-08 DIAGNOSIS — G309 Alzheimer's disease, unspecified: Secondary | ICD-10-CM | POA: Diagnosis not present

## 2016-07-08 DIAGNOSIS — R9431 Abnormal electrocardiogram [ECG] [EKG]: Secondary | ICD-10-CM | POA: Diagnosis not present

## 2016-07-08 DIAGNOSIS — M1990 Unspecified osteoarthritis, unspecified site: Secondary | ICD-10-CM | POA: Diagnosis present

## 2016-07-08 DIAGNOSIS — R634 Abnormal weight loss: Secondary | ICD-10-CM | POA: Diagnosis not present

## 2016-07-08 DIAGNOSIS — S82001A Unspecified fracture of right patella, initial encounter for closed fracture: Secondary | ICD-10-CM | POA: Diagnosis not present

## 2016-07-08 DIAGNOSIS — Z885 Allergy status to narcotic agent status: Secondary | ICD-10-CM | POA: Diagnosis not present

## 2016-07-08 DIAGNOSIS — S72391D Other fracture of shaft of right femur, subsequent encounter for closed fracture with routine healing: Secondary | ICD-10-CM | POA: Diagnosis not present

## 2016-07-08 DIAGNOSIS — K219 Gastro-esophageal reflux disease without esophagitis: Secondary | ICD-10-CM | POA: Diagnosis present

## 2016-07-08 DIAGNOSIS — F0150 Vascular dementia without behavioral disturbance: Secondary | ICD-10-CM | POA: Diagnosis not present

## 2016-07-08 DIAGNOSIS — R32 Unspecified urinary incontinence: Secondary | ICD-10-CM | POA: Diagnosis not present

## 2016-07-08 DIAGNOSIS — R339 Retention of urine, unspecified: Secondary | ICD-10-CM | POA: Diagnosis not present

## 2016-07-08 DIAGNOSIS — R400 Somnolence: Secondary | ICD-10-CM | POA: Diagnosis not present

## 2016-07-08 DIAGNOSIS — M25569 Pain in unspecified knee: Secondary | ICD-10-CM | POA: Diagnosis not present

## 2016-07-08 DIAGNOSIS — E871 Hypo-osmolality and hyponatremia: Secondary | ICD-10-CM | POA: Diagnosis not present

## 2016-07-08 DIAGNOSIS — R918 Other nonspecific abnormal finding of lung field: Secondary | ICD-10-CM | POA: Diagnosis not present

## 2016-07-08 DIAGNOSIS — M9711XS 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, sequela: Secondary | ICD-10-CM | POA: Diagnosis not present

## 2016-07-08 DIAGNOSIS — R6520 Severe sepsis without septic shock: Secondary | ICD-10-CM | POA: Diagnosis present

## 2016-07-08 DIAGNOSIS — R197 Diarrhea, unspecified: Secondary | ICD-10-CM | POA: Diagnosis not present

## 2016-07-08 DIAGNOSIS — Z8249 Family history of ischemic heart disease and other diseases of the circulatory system: Secondary | ICD-10-CM | POA: Diagnosis not present

## 2016-07-08 DIAGNOSIS — I129 Hypertensive chronic kidney disease with stage 1 through stage 4 chronic kidney disease, or unspecified chronic kidney disease: Secondary | ICD-10-CM | POA: Diagnosis present

## 2016-07-08 DIAGNOSIS — S86191A Other injury of other muscle(s) and tendon(s) of posterior muscle group at lower leg level, right leg, initial encounter: Secondary | ICD-10-CM | POA: Diagnosis not present

## 2016-07-08 DIAGNOSIS — R404 Transient alteration of awareness: Secondary | ICD-10-CM | POA: Diagnosis not present

## 2016-07-08 DIAGNOSIS — E1165 Type 2 diabetes mellitus with hyperglycemia: Secondary | ICD-10-CM | POA: Diagnosis not present

## 2016-07-08 DIAGNOSIS — Z79899 Other long term (current) drug therapy: Secondary | ICD-10-CM | POA: Diagnosis not present

## 2016-07-08 DIAGNOSIS — E000 Congenital iodine-deficiency syndrome, neurological type: Secondary | ICD-10-CM | POA: Diagnosis not present

## 2016-07-08 DIAGNOSIS — D62 Acute posthemorrhagic anemia: Secondary | ICD-10-CM | POA: Diagnosis not present

## 2016-07-08 DIAGNOSIS — I10 Essential (primary) hypertension: Secondary | ICD-10-CM | POA: Diagnosis not present

## 2016-07-08 DIAGNOSIS — A419 Sepsis, unspecified organism: Secondary | ICD-10-CM | POA: Diagnosis not present

## 2016-07-08 DIAGNOSIS — E7800 Pure hypercholesterolemia, unspecified: Secondary | ICD-10-CM | POA: Diagnosis present

## 2016-07-08 DIAGNOSIS — J9601 Acute respiratory failure with hypoxia: Secondary | ICD-10-CM | POA: Diagnosis not present

## 2016-07-08 DIAGNOSIS — R262 Difficulty in walking, not elsewhere classified: Secondary | ICD-10-CM | POA: Diagnosis not present

## 2016-07-08 DIAGNOSIS — Z794 Long term (current) use of insulin: Secondary | ICD-10-CM | POA: Diagnosis not present

## 2016-07-08 DIAGNOSIS — D591 Other autoimmune hemolytic anemias: Secondary | ICD-10-CM | POA: Diagnosis not present

## 2016-07-08 DIAGNOSIS — M6281 Muscle weakness (generalized): Secondary | ICD-10-CM | POA: Diagnosis not present

## 2016-07-08 DIAGNOSIS — D649 Anemia, unspecified: Secondary | ICD-10-CM | POA: Diagnosis not present

## 2016-07-08 DIAGNOSIS — E870 Hyperosmolality and hypernatremia: Secondary | ICD-10-CM | POA: Diagnosis not present

## 2016-07-08 DIAGNOSIS — W050XXA Fall from non-moving wheelchair, initial encounter: Secondary | ICD-10-CM | POA: Diagnosis not present

## 2016-07-08 DIAGNOSIS — Z4789 Encounter for other orthopedic aftercare: Secondary | ICD-10-CM | POA: Diagnosis not present

## 2016-07-08 DIAGNOSIS — R4182 Altered mental status, unspecified: Secondary | ICD-10-CM | POA: Diagnosis not present

## 2016-07-08 DIAGNOSIS — I119 Hypertensive heart disease without heart failure: Secondary | ICD-10-CM | POA: Diagnosis not present

## 2016-07-08 DIAGNOSIS — S0990XA Unspecified injury of head, initial encounter: Secondary | ICD-10-CM | POA: Diagnosis not present

## 2016-07-08 DIAGNOSIS — R0902 Hypoxemia: Secondary | ICD-10-CM | POA: Diagnosis not present

## 2016-07-08 DIAGNOSIS — Z993 Dependence on wheelchair: Secondary | ICD-10-CM | POA: Diagnosis not present

## 2016-07-08 DIAGNOSIS — N183 Chronic kidney disease, stage 3 (moderate): Secondary | ICD-10-CM | POA: Diagnosis present

## 2016-07-08 DIAGNOSIS — J302 Other seasonal allergic rhinitis: Secondary | ICD-10-CM | POA: Diagnosis not present

## 2016-07-08 DIAGNOSIS — S8990XA Unspecified injury of unspecified lower leg, initial encounter: Secondary | ICD-10-CM | POA: Diagnosis not present

## 2016-07-08 DIAGNOSIS — M9711XD 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, subsequent encounter: Secondary | ICD-10-CM | POA: Diagnosis not present

## 2016-07-08 DIAGNOSIS — R41841 Cognitive communication deficit: Secondary | ICD-10-CM | POA: Diagnosis not present

## 2016-07-08 DIAGNOSIS — E119 Type 2 diabetes mellitus without complications: Secondary | ICD-10-CM | POA: Diagnosis not present

## 2016-07-08 DIAGNOSIS — Z882 Allergy status to sulfonamides status: Secondary | ICD-10-CM | POA: Diagnosis not present

## 2016-07-08 LAB — GLUCOSE, CAPILLARY: GLUCOSE-CAPILLARY: 337 mg/dL — AB (ref 65–99)

## 2016-07-08 MED ORDER — HYDROMORPHONE HCL 2 MG PO TABS: 2.0000 mg | ORAL_TABLET | ORAL | 0 refills | Status: DC | PRN

## 2016-07-08 MED ORDER — METHOCARBAMOL 500 MG PO TABS: 500.0000 mg | ORAL_TABLET | Freq: Four times a day (QID) | ORAL | 0 refills | Status: DC | PRN

## 2016-07-08 MED ORDER — FLEET ENEMA 7-19 GM/118ML RE ENEM: 1.0000 | ENEMA | Freq: Once | RECTAL | 0 refills | Status: DC | PRN

## 2016-07-08 MED ORDER — ENSURE ENLIVE PO LIQD: 237.0000 mL | Freq: Two times a day (BID) | ORAL | 12 refills | Status: DC

## 2016-07-08 MED ORDER — ENOXAPARIN SODIUM 40 MG/0.4ML ~~LOC~~ SOLN: 40.0000 mg | SUBCUTANEOUS | 0 refills | Status: DC

## 2016-07-08 MED ORDER — BISACODYL 10 MG RE SUPP: 10.0000 mg | Freq: Every day | RECTAL | 0 refills | Status: DC | PRN

## 2016-07-08 NOTE — NC FL2 (Signed)
[place] MEDICAID FL2 LEVEL OF CARE SCREENING TOOL     IDENTIFICATION  Patient Name: [name] Birthdate: 01/25/1941 Sex: female Admission Date (Current Location): 07/04/2016  [facility] [place] Number:  Herbalist and Address:  [facility],  [place] [address], [place]      Provider Number: [id number]  Attending Physician Name and Address:  Gaynelle Arabian, MD  Relative Name and Phone Number:  Lilyauna, [place] Daughter [phone]     Current Level of Care: Hospital Recommended Level of Care: [place] Prior Approval Number:    Date Approved/Denied:   PASRR Number:   [id number]   Discharge Plan: SNF    Current Diagnoses: Patient Active Problem List   Diagnosis Date Noted  . 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 Abdominal pain, chronic, epigastric 08/10/2015  . Dehydration   . Leukocytosis   . Aspiration pneumonia ([place]) 07/21/2014  . DM type 2 (diabetes mellitus, type 2) ([place]) 07/21/2014  . Palliative care encounter 07/21/2014  . Dysphagia 07/09/2014  . Decubitus ulcer 07/09/2014  . Blood poisoning   . Acute respiratory failure with hypoxia ([place]) 06/30/2014  . Septic shock ([place]) 06/30/2014  . Lactic acidosis 06/30/2014  . Dementia 06/30/2014  . Acute respiratory failure with hypoxemia ([place])   . Hypernatremia   . Acute kidney injury ([place]) 05/28/2014  . Diabetes ([place]) 05/28/2014  . Fever 05/27/2014  . UTI (lower urinary tract infection) 05/27/2014  . Sepsis ([place]) 05/27/2014  . Acute encephalopathy   . FTT (failure to thrive) in adult   . Gait instability 07/18/2013    Class: Acute  . Low back pain 07/18/2013    Class: Acute  . Nausea with vomiting 07/18/2013    Class: Acute  . Abnormality of gait 04/29/2013  . 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 Head trauma 06/03/2010  . HYPERCHOLESTEROLEMIA 05/15/2008  . ANXIETY  DEPRESSION 05/15/2008  . DEPRESSION 05/15/2008  . Essential hypertension 05/15/2008  . OSTEOARTHRITIS 05/15/2008  . Edema 05/15/2008  . DYSPNEA ON EXERTION 05/15/2008  . ELECTROCARDIOGRAM, ABNORMAL 05/15/2008  . DIABETES MELLITUS, TYPE II, HX OF 05/15/2008    Orientation RESPIRATION BLADDER Height & Weight     Self, Place, Time  Normal incontinent Weight: 210 lb (95.3 kg) Height:  5\' 7"  (170.2 cm)  BEHAVIORAL SYMPTOMS/MOOD NEUROLOGICAL BOWEL NUTRITION STATUS      Continent Diet (low sodium Heart Healthy )  AMBULATORY STATUS COMMUNICATION OF NEEDS Skin   Limited Assist Verbally Surgical wounds (Left Leg Compression Wrap)                       Personal Care Assistance Level of Assistance  Bathing, Feeding, Dressing Bathing Assistance: Limited assistance Feeding assistance: Independent Dressing Assistance: Limited assistance     Functional Limitations Info  Sight, Hearing, Speech Sight Info: Impaired Hearing Info: Adequate Speech Info: Adequate    SPECIAL CARE FACTORS FREQUENCY  PT (By licensed PT), OT (By licensed OT)     PT Frequency: Min 3X/week              Contractures      Additional Factors Info  Code Status, Allergies, Psychotropic Code Status Info: DNR Allergies Info: Morphine And Related, Oxycodone, Sulfonamide Derivatives           Current Medications (07/08/2016):  This is the current hospital active medication list Current Facility-Administered Medications  Medication Dose Route Frequency Provider Last Rate Last  Dose  . acetaminophen (TYLENOL) tablet 650 mg  650 mg Oral Q6H PRN Cecilio Asper, Amber, PA-C   650 mg at 07/07/16 1048   Or  . acetaminophen (TYLENOL) suppository 650 mg  650 mg Rectal Q6H PRN Cecilio Asper, Amber, PA-C      . ALPRAZolam Duanne Moron) tablet 0.25 mg  0.25 mg Oral BID PRN Cecilio Asper, Amber, PA-C   0.25 mg at 07/07/16 0153  . amLODipine (NORVASC) tablet 5 mg  5 mg Oral Daily Constable, Amber, PA-C   5 mg at 07/07/16 1051  .  bisacodyl (DULCOLAX) suppository 10 mg  10 mg Rectal Daily PRN Cecilio Asper, [employer], PA-C      . Chlorhexidine Gluconate Cloth 2 % PADS 6 each  6 each Topical Daily Gaynelle Arabian, MD   6 each at 07/07/16 1000  . docusate sodium (COLACE) capsule 100 mg  100 mg Oral BID Cecilio Asper, Amber, PA-C   100 mg at 07/07/16 2202  . DULoxetine (CYMBALTA) DR capsule 30 mg  30 mg Oral BID Ardeen Jourdain, PA-C   30 mg at 07/07/16 2202  . enoxaparin (LOVENOX) injection 40 mg  40 mg Subcutaneous Q24H Gaynelle Arabian, MD   40 mg at 07/08/16 0743  . feeding supplement (ENSURE ENLIVE) (ENSURE ENLIVE) liquid 237 mL  237 mL Oral BID BM Aluisio, Pilar Plate, MD   237 mL at 07/07/16 1059  . hydrocortisone cream 1 % 1 application  1 application Topical Daily PRN Cecilio Asper, Amber, PA-C      . HYDROmorphone (DILAUDID) injection 0.5-1 mg  0.5-1 mg Intravenous Q2H PRN Cecilio Asper, Amber, PA-C   0.5 mg at 07/08/16 0751  . HYDROmorphone (DILAUDID) tablet 2-4 mg  2-4 mg Oral Q4H PRN Aluisio, Pilar Plate, MD      . insulin aspart (novoLOG) injection 0-15 Units  0-15 Units Subcutaneous TID WC Constable, Amber, PA-C   11 Units at 07/08/16 0743  . insulin detemir (LEVEMIR) injection 12 Units  12 Units Subcutaneous Daily Constable, Amber, PA-C   12 Units at 07/07/16 1049  . insulin detemir (LEVEMIR) injection 5 Units  5 Units Subcutaneous QHS Constable, Amber, PA-C   5 Units at 07/07/16 2202  . loratadine (CLARITIN) tablet 10 mg  10 mg Oral Daily PRN Cecilio Asper, Amber, PA-C      . losartan (COZAAR) tablet 100 mg  100 mg Oral Daily Constable, Amber, PA-C   100 mg at 07/07/16 1049  . methocarbamol (ROBAXIN) tablet 500 mg  500 mg Oral Q6H PRN Aluisio, Pilar Plate, MD       Or  . methocarbamol (ROBAXIN) 500 mg in dextrose 5 % 50 mL IVPB  500 mg Intravenous Q6H PRN Gaynelle Arabian, MD   Stopped at 07/07/16 0232  . metoCLOPramide (REGLAN) tablet 5-10 mg  5-10 mg Oral Q8H PRN Gaynelle Arabian, MD       Or  . metoCLOPramide (REGLAN) injection 5-10 mg  5-10 mg  Intravenous Q8H PRN Aluisio, Pilar Plate, MD      . metoprolol succinate (TOPROL-XL) 24 hr tablet 50 mg  50 mg Oral Daily Constable, Amber, PA-C   50 mg at 07/07/16 1051  . mupirocin ointment (BACTROBAN) 2 % 1 application  1 application Nasal BID Gaynelle Arabian, MD   1 application at 85/46/27 2203  . omeprazole (PRILOSEC) capsule 40 mg  40 mg Oral Daily Gaynelle Arabian, MD   40 mg at 07/05/16 1032  . ondansetron (ZOFRAN) tablet 4 mg  4 mg Oral Q6H PRN Cecilio Asper, Amber, PA-C   4 mg at 07/06/16 0350  Or  . ondansetron (ZOFRAN) injection 4 mg  4 mg Intravenous Q6H PRN Cecilio Asper, Amber, PA-C   4 mg at 07/07/16 0152  . polyethylene glycol (MIRALAX / GLYCOLAX) packet 17 g  17 g Oral Daily PRN Cecilio Asper, Amber, PA-C      . potassium chloride SA (K-DUR,KLOR-CON) CR tablet 20 mEq  20 mEq Oral Daily Constable, Amber, PA-C   20 mEq at 07/07/16 1051  . pramipexole (MIRAPEX) tablet 0.375 mg  0.375 mg Oral TID Ardeen Jourdain, PA-C   0.375 mg at 07/07/16 2203  . sodium phosphate (FLEET) 7-19 GM/118ML enema 1 enema  1 enema Rectal Once PRN Cecilio Asper, Amber, PA-C      . traMADol (ULTRAM) tablet 50 mg  50 mg Oral Q6H PRN Cecilio Asper, Amber, PA-C   50 mg at 07/06/16 2774     Discharge Medications: Please see discharge summary for a list of discharge medications.  Relevant Imaging Results:  Relevant Lab Results:   Additional Information ssn:[id number]  Lia Hopping, LCSW

## 2016-07-08 NOTE — Progress Notes (Signed)
   Subjective: 2 Days Post-Op Procedure(s) (LRB): OPEN REDUCTION INTERNAL FIXATION (ORIF) PERIPROSTHETIC FRACTURE (Right) Patient reports pain as mild.   Patient seen in rounds with Dr. Wynelle Link.  Patient looks much better now.  Appears more comfortable today. Patient is well, but has had some minor complaints of pain in the thigh and knee, requiring pain medications Patient is ready to go back to [facility] today.  Objective: Vital signs in last 24 hours: Temp:  [99.1 F (37.3 C)-99.9 F (37.7 C)] 99.5 F (37.5 C) (05/18 0627) Pulse Rate:  [75-95] 75 (05/18 0627) Resp:  [17-18] 17 (05/18 0627) BP: (120-143)/(51-74) 143/74 (05/18 0627) SpO2:  [95 %-98 %] 95 % (05/18 0627)  Intake/Output from previous day:  Intake/Output Summary (Last 24 hours) at 07/08/16 0756 Last data filed at 07/07/16 1059  Gross per 24 hour  Intake              483 ml  Output                0 ml  Net              483 ml    Intake/Output this shift: No intake/output data recorded.  Labs:  Recent Labs  07/06/16 0645  HGB 10.9*    Recent Labs  07/06/16 0645  WBC 13.1*  RBC 4.17  HCT 33.8*  PLT 256    Recent Labs  07/06/16 0645  NA 141  K 4.0  CL 106  CO2 24  BUN 22*  CREATININE 0.98  GLUCOSE 329*  CALCIUM 9.2   No results for input(s): LABPT, INR in the last 72 hours.  EXAM: General - Patient is Alert, Appropriate and Oriented Extremity - Neurovascular intact Sensation intact distally Intact pulses distally Dorsiflexion/Plantar flexion intact Incision - clean, dry, staples intact, no   Motor Function - intact, moving foot and toes well on exam.   Assessment/Plan: 2 Days Post-Op Procedure(s) (LRB): OPEN REDUCTION INTERNAL FIXATION (ORIF) PERIPROSTHETIC FRACTURE (Right) Procedure(s) (LRB): OPEN REDUCTION INTERNAL FIXATION (ORIF) PERIPROSTHETIC FRACTURE (Right) Past Medical History:  Diagnosis Date  . Anxiety   . Depression   . Diabetes mellitus   . Dyspnea   . GERD  (gastroesophageal reflux disease)   . Headache(784.0)   . HTN (hypertension)    dr Johnsie Cancel  . Hypercholesteremia    DENIES  . Osteoarthritis   . PONV (postoperative nausea and vomiting)   . RLS (restless legs syndrome)   . 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   Weight as of this encounter: 95.3 kg (210 lb). Up with therapy Discharge to SNF Diet - Cardiac diet and Diabetic diet Follow up - in 2 weeks on Tuesday 29th with Dr. Wynelle Link Activity - Touch down weight bearing only to the right leg Disposition - Skilled nursing facility - [facility] Condition Upon Discharge - Stable D/C Meds - See DC Summary DVT Prophylaxis - Lovenox for two weeks and then resume the baby 81 mg Aspirin   Arlee Muslim, PA-C Orthopaedic Surgery 07/08/2016, 7:56 AM

## 2016-07-08 NOTE — Evaluation (Signed)
OT Cancellation Note  Patient Details Name: ALYA SMALTZ MRN: [id number] DOB: 03-Sep-1940   Cancelled Treatment:    Reason Eval/Treat Not Completed: Other (comment); noted Pt with Medicare, admitted from SNF and returning to SNF, will defer OT evaluation to next venue of care.   Lou Cal, OT Pager [phone] 07/08/2016   Raymondo Band 07/08/2016, 7:30 AM

## 2016-07-08 NOTE — Discharge Instructions (Signed)
INSTRUCTIONS following surgery.  o Remove items at home which could result in a fall. This includes throw rugs or furniture in walking pathways o ICE to the affected joint every three hours while awake for 30 minutes at a time, for at least the first 3-5 days, and then as needed for pain and swelling.  Continue to use ice for pain and swelling. You may notice swelling that will progress down to the foot and ankle.  This is normal after surgery.  Elevate your leg when you are not up walking on it.   o Continue to use the breathing machine you got in the hospital (incentive spirometer) which will help keep your temperature down.  It is common for your temperature to cycle up and down following surgery, especially at night when you are not up moving around and exerting yourself.  The breathing machine keeps your lungs expanded and your temperature down.   DIET:  As you were doing prior to hospitalization, we recommend a well-balanced diet.  DRESSING / WOUND CARE / SHOWERING  You may shower 3 days after surgery, but keep the wounds dry during showering.  You may use an occlusive plastic wrap (Press'n Seal for example), NO SOAKING/SUBMERGING IN THE BATHTUB.  If the bandage gets wet, change with a clean dry gauze.  If the incision gets wet, pat the wound dry with a clean towel.  ACTIVITY  o Increase activity slowly as tolerated, but follow the weight bearing instructions below.   o No driving for 6 weeks or until further direction given by your physician.  You cannot drive while taking narcotics.  o Avoid periods of inactivity such as sitting longer than an hour when not asleep. This helps prevent blood clots.  o You may return to work once you are authorized by your doctor.     WEIGHT BEARING   Other:  Touch Down Weight Bearing ONLY to the right leg   CONSTIPATION  Constipation is defined medically as fewer than three stools per week and severe constipation as less than one stool per week.   Even if you have a regular bowel pattern at home, your normal regimen is likely to be disrupted due to multiple reasons following surgery.  Combination of anesthesia, postoperative narcotics, change in appetite and fluid intake all can affect your bowels.   YOU MUST use at least one of the following options; they are listed in order of increasing strength to get the job done.  They are all available over the counter, and you may need to use some, POSSIBLY even all of these options:    Drink plenty of fluids (prune juice may be helpful) and high fiber foods Colace 100 mg by mouth twice a day  Senokot for constipation as directed and as needed Dulcolax (bisacodyl), take with full glass of water  Miralax (polyethylene glycol) once or twice a day as needed.  If you have tried all these things and are unable to have a bowel movement in the first 3-4 days after surgery call either your surgeon or your primary doctor.    If you experience loose stools or diarrhea, hold the medications until you stool forms back up.  If your symptoms do not get better within 1 week or if they get worse, check with your doctor.  If you experience "the worst abdominal pain ever" or develop nausea or vomiting, please contact the office immediately for further recommendations for treatment.   ITCHING:  If you experience itching  with your medications, try taking only a single pain pill, or even half a pain pill at a time.  You can also use Benadryl over the counter for itching or also to help with sleep.   TED HOSE STOCKINGS:  Use stockings on both legs until for at least 2 weeks or as directed by physician office. They may be removed at night for sleeping.  MEDICATIONS:  See your medication summary on the After Visit Summary that nursing will review with you.  You may have some home medications which will be placed on hold until you complete the course of blood thinner medication.  It is important for you to complete the  blood thinner medication as prescribed.  PRECAUTIONS:  If you experience chest pain or shortness of breath - call 911 immediately for transfer to the hospital emergency department.   If you develop a fever greater that 101 F, purulent drainage from wound, increased redness or drainage from wound, foul odor from the wound/dressing, or calf pain - CONTACT YOUR SURGEON.                                                   FOLLOW-UP APPOINTMENTS:  Call office at [phone] to setup appointment for patient for Tuesday May 29th with Dr. Wynelle Link.  MAKE SURE YOU:   Understand these instructions.   Get help right away if you are not doing well or get worse.    Thank you for letting us be a part of your medical care team.  It is a privilege we respect greatly.  We hope these instructions will help you stay on track for a fast and full recovery!

## 2016-07-08 NOTE — Progress Notes (Signed)
Patient returning to [facility]. CSW confirmed with admission patient has bed hold and can return without completing paperwork. fl2 completed, d/c summary sent to facility. PTAR arranged for 12:30 pm pick up today.   Kathrin Greathouse, Latanya Presser, MSW Clinical Social Worker 5E and Psychiatric Service Line [phone] 07/08/2016  10:19 AM

## 2016-07-08 NOTE — Discharge Summary (Signed)
Physician Discharge Summary   Patient ID: [name] MRN: [id number] DOB/AGE: Oct 11, 1940 76 y.o.  Admit date: 07/04/2016 Discharge date: 07-08-2016  Primary Diagnosis:  Right distal femoral periprosthetic fracture. Admission Diagnoses:  Past Medical History:  Diagnosis Date  . Anxiety   . Depression   . Diabetes mellitus   . Dyspnea   . GERD (gastroesophageal reflux disease)   . Headache(784.0)   . HTN (hypertension)    dr Johnsie Cancel  . Hypercholesteremia    DENIES  . Osteoarthritis   . PONV (postoperative nausea and vomiting)   . RLS (restless legs syndrome)   . 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   Weight as of this encounter: 95.3 kg (210 lb).  Procedure:  Procedure(s) (LRB): OPEN REDUCTION INTERNAL FIXATION (ORIF) PERIPROSTHETIC FRACTURE (Right)   Consults: None  HPI: [name] is a 76 year old female, who had a fall at [facility] two days ago leaning forward and sliding out of her chair landing directly on her right knee.  She sustained a distal femoral periprosthetic fracture above the total knee.  She presents now for open reduction and internal fixation.  Laboratory Data: Admission on 07/04/2016  Component Date Value Ref Range Status  . Sodium 07/04/2016 140  135 - 145 mmol/L Final  . Potassium 07/04/2016 3.7  3.5 - 5.1 mmol/L Final  . Chloride 07/04/2016 104  101 - 111 mmol/L Final  . CO2 07/04/2016 27  22 - 32 mmol/L Final  . Glucose, Bld 07/04/2016 285* 65 - 99 mg/dL Final  . BUN 07/04/2016 19  6 - 20 mg/dL Final  . Creatinine, Ser 07/04/2016 0.80  0.44 - 1.00 mg/dL Final  . Calcium 07/04/2016 9.3  8.9 - 10.3 mg/dL Final  . GFR calc non Af Amer 07/04/2016 >60  >60 mL/min Final  . GFR calc Af Amer 07/04/2016 >60  >60 mL/min Final   Comment: (NOTE) The eGFR has been calculated using the CKD EPI equation. This calculation has not been validated in all clinical situations. eGFR's persistently <60 mL/min signify possible Chronic Kidney Disease.   . Anion gap 07/04/2016 9  5 - 15 Final  . WBC 07/04/2016 14.7* 4.0 - 10.5 K/uL Final  . RBC 07/04/2016 4.84  3.87 - 5.11 MIL/uL Final  . Hemoglobin 07/04/2016 12.8  12.0 - 15.0 g/dL Final  . HCT 07/04/2016 38.5  36.0 - 46.0 % Final  . MCV 07/04/2016 79.5  78.0 - 100.0 fL Final  . MCH 07/04/2016 26.4  26.0 - 34.0 pg Final  . MCHC 07/04/2016 33.2  30.0 - 36.0 g/dL Final  . RDW 07/04/2016 15.1  11.5 - 15.5 % Final  . Platelets 07/04/2016 267  150 - 400 K/uL Final  . Neutrophils Relative % 07/04/2016 88  % Final  . Neutro Abs 07/04/2016 12.9* 1.7 - 7.7 K/uL Final  . Lymphocytes Relative 07/04/2016 9  % Final  . Lymphs Abs 07/04/2016 1.3  0.7 - 4.0 K/uL Final  . Monocytes Relative 07/04/2016 3  % Final  . Monocytes Absolute 07/04/2016 0.5  0.1 - 1.0 K/uL Final  . Eosinophils Relative 07/04/2016 0  % Final  . Eosinophils Absolute 07/04/2016 0.0  0.0 - 0.7 K/uL Final  . Basophils Relative 07/04/2016 0  % Final  . Basophils Absolute 07/04/2016  0.0  0.0 - 0.1 K/uL Final  . Prothrombin Time 07/04/2016 12.7  11.4 - 15.2 seconds Final  . INR 07/04/2016 0.95   Final  . ABO/RH(D) 07/04/2016 O POS   Final  . Antibody Screen 07/04/2016 NEG   Final  . Sample Expiration 07/04/2016 07/07/2016   Final  . Glucose-Capillary 07/04/2016 301* 65 - 99 mg/dL Final  . Sodium 07/05/2016 141  135 - 145 mmol/L Final  . Potassium 07/05/2016 3.7  3.5 - 5.1 mmol/L Final  . Chloride 07/05/2016 105  101 - 111 mmol/L Final  . CO2 07/05/2016 25  22 - 32 mmol/L Final  . Glucose, Bld 07/05/2016 373* 65 - 99 mg/dL Final  . BUN 07/05/2016 21* 6 - 20 mg/dL Final  . Creatinine, Ser 07/05/2016 0.91  0.44 - 1.00 mg/dL Final  . Calcium 07/05/2016 9.4  8.9 - 10.3 mg/dL Final  . Total Protein 07/05/2016 7.2  6.5 -  8.1 g/dL Final  . Albumin 07/05/2016 3.9  3.5 - 5.0 g/dL Final  . AST 07/05/2016 13* 15 - 41 U/L Final  . ALT 07/05/2016 15  14 - 54 U/L Final  . Alkaline Phosphatase 07/05/2016 84  38 - 126 U/L Final  . Total Bilirubin 07/05/2016 0.8  0.3 - 1.2 mg/dL Final  . GFR calc non Af Amer 07/05/2016 59* >60 mL/min Final  . GFR calc Af Amer 07/05/2016 >60  >60 mL/min Final   Comment: (NOTE) The eGFR has been calculated using the CKD EPI equation. This calculation has not been validated in all clinical situations. eGFR's persistently <60 mL/min signify possible Chronic Kidney Disease.   . Anion gap 07/05/2016 11  5 - 15 Final  . Prothrombin Time 07/05/2016 13.6  11.4 - 15.2 seconds Final  . INR 07/05/2016 1.04   Final  . aPTT 07/05/2016 35  24 - 36 seconds Final  . WBC 07/05/2016 12.2* 4.0 - 10.5 K/uL Final  . RBC 07/05/2016 4.62  3.87 - 5.11 MIL/uL Final  . Hemoglobin 07/05/2016 11.8* 12.0 - 15.0 g/dL Final  . HCT 07/05/2016 37.0  36.0 - 46.0 % Final  . MCV 07/05/2016 80.1  78.0 - 100.0 fL Final  . MCH 07/05/2016 25.5* 26.0 - 34.0 pg Final  . MCHC 07/05/2016 31.9  30.0 - 36.0 g/dL Final  . RDW 07/05/2016 15.2  11.5 - 15.5 % Final  . Platelets 07/05/2016 277  150 - 400 K/uL Final  . MRSA by PCR 07/05/2016 NEGATIVE  NEGATIVE Final   Comment:        The GeneXpert MRSA Assay (FDA approved for NASAL specimens only), is one component of a comprehensive MRSA colonization surveillance program. It is not intended to diagnose MRSA infection nor to guide or monitor treatment for MRSA infections.   . Glucose-Capillary 07/05/2016 345* 65 - 99 mg/dL Final  . Comment 1 07/05/2016 Notify RN   Final  . Glucose-Capillary 07/05/2016 285* 65 - 99 mg/dL Final  . Comment 1 07/05/2016 Notify RN   Final  . Glucose-Capillary 07/05/2016 299* 65 - 99 mg/dL Final  . Comment 1 07/05/2016 Notify RN   Final  . Sodium 07/06/2016 141  135 - 145 mmol/L Final  . Potassium 07/06/2016 4.0  3.5 - 5.1 mmol/L Final  .  Chloride 07/06/2016 106  101 - 111 mmol/L Final  . CO2 07/06/2016 24  22 - 32 mmol/L Final  . Glucose, Bld 07/06/2016 329* 65 - 99 mg/dL Final  . BUN 07/06/2016 22* 6 - 20 mg/dL  Final  . Creatinine, Ser 07/06/2016 0.98  0.44 - 1.00 mg/dL Final  . Calcium 07/06/2016 9.2  8.9 - 10.3 mg/dL Final  . GFR calc non Af Amer 07/06/2016 54* >60 mL/min Final  . GFR calc Af Amer 07/06/2016 >60  >60 mL/min Final   Comment: (NOTE) The eGFR has been calculated using the CKD EPI equation. This calculation has not been validated in all clinical situations. eGFR's persistently <60 mL/min signify possible Chronic Kidney Disease.   . Anion gap 07/06/2016 11  5 - 15 Final  . WBC 07/06/2016 13.1* 4.0 - 10.5 K/uL Final  . RBC 07/06/2016 4.17  3.87 - 5.11 MIL/uL Final  . Hemoglobin 07/06/2016 10.9* 12.0 - 15.0 g/dL Final  . HCT 07/06/2016 33.8* 36.0 - 46.0 % Final  . MCV 07/06/2016 81.1  78.0 - 100.0 fL Final  . MCH 07/06/2016 26.1  26.0 - 34.0 pg Final  . MCHC 07/06/2016 32.2  30.0 - 36.0 g/dL Final  . RDW 07/06/2016 15.6* 11.5 - 15.5 % Final  . Platelets 07/06/2016 256  150 - 400 K/uL Final  . Glucose-Capillary 07/05/2016 256* 65 - 99 mg/dL Final  . Glucose-Capillary 07/06/2016 316* 65 - 99 mg/dL Final  . Glucose-Capillary 07/06/2016 248* 65 - 99 mg/dL Final  . Comment 1 07/06/2016 Notify RN   Final  . Comment 2 07/06/2016 Document in Chart   Final  . MRSA, PCR 07/06/2016 NEGATIVE  NEGATIVE Final  . Staphylococcus aureus 07/06/2016 POSITIVE* NEGATIVE Final   Comment:        The Xpert SA Assay (FDA approved for NASAL specimens in patients over 20 years of age), is one component of a comprehensive surveillance program.  Test performance has been validated by [facility] for patients greater than or equal to 68 year old. It is not intended to diagnose infection nor to guide or monitor treatment.   . Glucose-Capillary 07/06/2016 171* 65 - 99 mg/dL Final  . Glucose-Capillary 07/06/2016 248* 65 - 99  mg/dL Final  . Comment 1 07/06/2016 Notify RN   Final  . Comment 2 07/06/2016 Document in Chart   Final  . Glucose-Capillary 07/07/2016 370* 65 - 99 mg/dL Final  . Glucose-Capillary 07/06/2016 162* 65 - 99 mg/dL Final  . Glucose-Capillary 07/07/2016 351* 65 - 99 mg/dL Final  . Glucose-Capillary 07/07/2016 265* 65 - 99 mg/dL Final  . Glucose-Capillary 07/07/2016 340* 65 - 99 mg/dL Final  . Glucose-Capillary 07/08/2016 337* 65 - 99 mg/dL Final     X-Rays:Dg Chest 1 View  Result Date: 07/04/2016 CLINICAL DATA:  Fall with leg injury EXAM: CHEST 1 VIEW COMPARISON:  07/20/2014 FINDINGS: Borderline to mild cardiomegaly without overt failure. No consolidation or pleural effusion. No pneumothorax. IMPRESSION: Low lung volumes with borderline cardiomegaly. No edema or infiltrate. Electronically Signed   By: Donavan Foil M.D.   On: 07/04/2016 19:14   Dg Knee Complete 4 Views Right  Result Date: 07/04/2016 CLINICAL DATA:  Injury, fall EXAM: RIGHT KNEE - COMPLETE 4+ VIEW COMPARISON:  11/09/2013 FINDINGS: The patient is status post right knee replacement. Mildly comminuted and impacted fracture of the distal femoral shaft and metaphysis, just proximal to the prosthetic. Approximately 1/4 shaft diameter of posterior and lateral displacement of distal fracture fragment with mild posterior angulation of the distal fragment as well. IMPRESSION: Acute, mildly displaced and angulated distal femoral fracture, adjacent to the femoral prosthetic. Electronically Signed   By: Donavan Foil M.D.   On: 07/04/2016 19:13   Dg C-arm 1-60  Min-no Report  Result Date: 07/06/2016 Fluoroscopy was utilized by the requesting physician.  No radiographic interpretation.   Dg Femur, Min 2 Views Right  Result Date: 07/06/2016 CLINICAL DATA:  ORIF distal right femur fracture. EXAM: RIGHT FEMUR 2 VIEWS COMPARISON:  07/04/2016 FINDINGS: Three intraoperative spot views of the right femur are submitted postoperatively for  interpretation. Interval placement of an intramedullary rod and proximal and transfixing screws within the right femur noted, traversing a distal femur fracture, in near-anatomic alignment and position. Right total knee arthroplasty changes again noted. IMPRESSION: Internal fixation of distal femur fracture, in near-anatomic alignment and position. Electronically Signed   By: Margarette Canada M.D.   On: 07/06/2016 18:52   Dg Femur Min 2 Views Right  Result Date: 07/04/2016 CLINICAL DATA:  Fall at nursing home today deformity to the right leg EXAM: RIGHT FEMUR 2 VIEWS COMPARISON:  None. FINDINGS: Right femoral head projects in joint. Right pubic rami appear intact. No fracture or dislocation of the proximal right femur. Comminuted distal femoral shaft fracture just above the right femoral prosthetic. There is mild posterior angulation of distal fracture fragment. IMPRESSION: Comminuted and mildly angulated fracture involving the distal shaft and metaphysis of the femur, just proximal to the femoral prosthetic. Electronically Signed   By: Donavan Foil M.D.   On: 07/04/2016 19:11    EKG: Orders placed or performed during the hospital encounter of 07/09/14  . EKG 12-Lead  . EKG 12-Lead  . EKG 12-Lead  . EKG     Hospital Course: [name] is a 76 y.o. who was admitted to [facility] following a fall in which she sustained injury to the right leg. She was seen in the ED where radiographs showed she sustained a right knee mildly displaced periprosthetic femur fracture.  She was placed at bedrest and admitted to the hospital.  She was admitted by Dr. Tonita Cong who was covering for Dr. Wynelle Link.  She was seen the following day and set plans for surgery the following day.  They were brought to the operating room on 07/06/2016 and underwent Procedure(s): OPEN REDUCTION INTERNAL FIXATION (ORIF) PERIPROSTHETIC FRACTURE.  Patient tolerated the procedure well and was later transferred to the recovery room  and then to the orthopaedic floor for postoperative care.  They were given PO and IV analgesics for pain control following their surgery.  They were given 24 hours of postoperative antibiotics of  Anti-infectives    Start     Dose/Rate Route Frequency Ordered Stop   07/07/16 0800  ceFAZolin (ANCEF) IVPB 2g/100 mL premix     2 g 200 mL/hr over 30 Minutes Intravenous 3 times per day on Thu 07/07/16 0725 07/07/16 1848   07/06/16 1537  ceFAZolin (ANCEF) 2-4 GM/100ML-% IVPB    Comments:  Bridget Hartshorn   : cabinet override      07/06/16 1537 07/07/16 0344   07/06/16 1427  ceFAZolin (ANCEF) IVPB 1 g/50 mL premix  Status:  Discontinued     1 g 100 mL/hr over 30 Minutes Intravenous Every 8 hours 07/06/16 1427 07/06/16 1445   07/06/16 0800  ceFAZolin (ANCEF) IVPB 2g/100 mL premix  Status:  Discontinued     2 g 200 mL/hr over 30 Minutes Intravenous On call to O.R. 07/05/16 0915 07/06/16 2034   07/05/16 0600  ceFAZolin (ANCEF) IVPB 2g/100 mL premix  Status:  Discontinued     2 g 200 mL/hr over 30 Minutes Intravenous On call to O.R. 07/04/16 2354 07/05/16 3810  and started on DVT prophylaxis in the form of Lovenox.   PT and OT were ordered for total joint protocol. Social worker consulted to assist with transfer back to the facility.  Discharge planning consulted to help with postop disposition and equipment needs.  Patient had a tough night on the evening of surgery.  They started to get up OOB with therapy on day one. She was placed TDWB to the right leg.  Allowed to have passive and active ROM to the knee but no weight on the leg when up. Continued to work with therapy into day two.  Dressing was changed on day two and the incision was healing well.   Patient was seen in rounds on POD 2 by Dr. Wynelle Link and it was felt that she was ready to go back to the SNF, [facility].   Discharge to SNF Diet - Cardiac diet and Diabetic diet Follow up - in 2 weeks on Tuesday 29th with Dr. Wynelle Link Activity -  Touch down weight bearing only to the right leg Disposition - Skilled nursing facility - [facility] Condition Upon Discharge - Stable D/C Meds - See DC Summary DVT Prophylaxis - Lovenox for two weeks and then resume the baby 81 mg Aspirin    Discharge Instructions    Call MD / Call 911    Complete by:  As directed    If you experience chest pain or shortness of breath, CALL 911 and be transported to the hospital emergency room.  If you develope a fever above 101 F, pus (white drainage) or increased drainage or redness at the wound, or calf pain, call your surgeon's office.   Change dressing    Complete by:  As directed    Change dressing daily with sterile 4 x 4 inch gauze dressing and apply TED hose. Do not submerge the incision under water.   Constipation Prevention    Complete by:  As directed    Drink plenty of fluids.  Prune juice may be helpful.  You may use a stool softener, such as Colace (over the counter) 100 mg twice a day.  Use MiraLax (over the counter) for constipation as needed.   Diet - low sodium heart healthy    Complete by:  As directed    Diet Carb Modified    Complete by:  As directed    Discharge instructions    Complete by:  As directed    Take Lovenox 40 mg Sub-Q injection daily for two weeks, then discontinue the Lovenox injections and resume the baby 81 mg Aspirin daily.  Pick up stool softner and laxative for home use following surgery while on pain medications. Do not submerge incision under water. Please use good hand washing techniques while changing dressing each day. May shower starting three days after surgery. Please use a clean towel to pat the incision dry following showers. Continue to use ice for pain and swelling after surgery. Do not use any lotions or creams on the incision until instructed by your surgeon.  Wear both TED hose on both legs during the day every day for three weeks, but may remove the TED hose at night at  home.  Postoperative Constipation Protocol  Constipation - defined medically as fewer than three stools per week and severe constipation as less than one stool per week.  One of the most common issues patients have following surgery is constipation.  Even if you have a regular bowel pattern at home, your normal regimen is likely  to be disrupted due to multiple reasons following surgery.  Combination of anesthesia, postoperative narcotics, change in appetite and fluid intake all can affect your bowels.  In order to avoid complications following surgery, here are some recommendations in order to help you during your recovery period.  Colace (docusate) - Pick up an over-the-counter form of Colace or another stool softener and take twice a day as long as you are requiring postoperative pain medications.  Take with a full glass of water daily.  If you experience loose stools or diarrhea, hold the colace until you stool forms back up.  If your symptoms do not get better within 1 week or if they get worse, check with your doctor.  Dulcolax (bisacodyl) - Pick up over-the-counter and take as directed by the product packaging as needed to assist with the movement of your bowels.  Take with a full glass of water.  Use this product as needed if not relieved by Colace only.   MiraLax (polyethylene glycol) - Pick up over-the-counter to have on hand.  MiraLax is a solution that will increase the amount of water in your bowels to assist with bowel movements.  Take as directed and can mix with a glass of water, juice, soda, coffee, or tea.  Take if you go more than two days without a movement. Do not use MiraLax more than once per day. Call your doctor if you are still constipated or irregular after using this medication for 7 days in a row.  If you continue to have problems with postoperative constipation, please contact the office for further assistance and recommendations.  If you experience "the worst abdominal  pain ever" or develop nausea or vomiting, please contact the office immediatly for further recommendations for treatment.    Do not sit on low chairs, stoools or toilet seats, as it may be difficult to get up from low surfaces    Complete by:  As directed    Increase activity slowly as tolerated    Complete by:  As directed    PT may perform Passive ROM and Active ROM to the right knee.   TDWB only to the Right Leg   Patient may shower    Complete by:  As directed    You may shower without a dressing once there is no drainage.  Do not wash over the wound.  If drainage remains, do not shower until drainage stops.   TED hose    Complete by:  As directed    Use stockings (TED hose) for 3 weeks on both leg(s).  You may remove them at night for sleeping.   Touch down weight bearing    Complete by:  As directed    Laterality:  right   Extremity:  Lower     Allergies as of 07/08/2016      Reactions   Morphine And Related Other (See Comments)   hallucination   Oxycodone Other (See Comments)   hallucination    Sulfonamide Derivatives Swelling      Medication List    STOP taking these medications   aspirin 81 MG chewable tablet     TAKE these medications   acetaminophen 650 MG CR tablet Commonly known as:  TYLENOL Take 650 mg by mouth 2 (two) times daily.   acetaminophen 500 MG tablet Commonly known as:  TYLENOL Take 500 mg by mouth every 8 (eight) hours as needed for mild pain.   ALPRAZolam 0.25 MG tablet Commonly known as:  XANAX Take 0.25 mg by mouth 2 (two) times daily as needed for anxiety.   amLODipine 5 MG tablet Commonly known as:  NORVASC Take 1 tablet (5 mg total) by mouth daily.   AZO CRANBERRY 250-30 MG Tabs Take 1 tablet by mouth daily.   bisacodyl 10 MG suppository Commonly known as:  DULCOLAX Place 1 suppository (10 mg total) rectally daily as needed for moderate constipation.   docusate sodium 100 MG capsule Commonly known as:  COLACE Take 100 mg by  mouth 2 (two) times daily.   DULoxetine 30 MG capsule Commonly known as:  CYMBALTA Take 30 mg by mouth 2 (two) times daily.   enoxaparin 40 MG/0.4ML injection Commonly known as:  LOVENOX Inject 0.4 mLs (40 mg total) into the skin daily. Lovenox 40 mg sub-Q injection daily for a total of two weeks, then discontinue the Lovenox and resume the baby 81 mg Aspirin daily. Start taking on:  07/09/2016   ergocalciferol 50000 units capsule Commonly known as:  VITAMIN D2 Take 50,000 Units by mouth once a week. On Monday's   feeding supplement (ENSURE ENLIVE) Liqd Take 237 mLs by mouth 2 (two) times daily between meals.   hydrocortisone cream 1 % Apply 1 application topically daily as needed for itching.   HYDROmorphone 2 MG tablet Commonly known as:  DILAUDID Take 1-2 tablets (2-4 mg total) by mouth every 4 (four) hours as needed for severe pain.   insulin aspart 100 UNIT/ML injection Commonly known as:  novoLOG Inject 0-9 Units into the skin 3 (three) times daily with meals. CBG < 70: implement hypoglycemia protocol CBG 70 - 120: 0 units CBG 121 - 150: 1 unit CBG 151 - 200: 2 units CBG 201 - 250: 3 units CBG 251 - 300: 5 units CBG 301 - 350: 7 units CBG 351 - 400: 9 units CBG > 400: call MD   LEVEMIR FLEXTOUCH 100 UNIT/ML Pen Generic drug:  Insulin Detemir Inject 12 Units into the skin daily. What changed:  Another medication with the same name was changed. Make sure you understand how and when to take each.   insulin detemir 100 UNIT/ML injection Commonly known as:  LEVEMIR Inject 0.05 mLs (5 Units total) into the skin at bedtime. What changed:  how much to take   loratadine 10 MG tablet Commonly known as:  CLARITIN Take 10 mg by mouth daily as needed for allergies.   losartan 100 MG tablet Commonly known as:  COZAAR Take 100 mg by mouth daily.   Magnesium Oxide 500 MG Tabs Take 500 mg by mouth daily.   methocarbamol 500 MG tablet Commonly known as:  ROBAXIN Take 1 tablet  (500 mg total) by mouth every 6 (six) hours as needed for muscle spasms.   metoprolol succinate 50 MG 24 hr tablet Commonly known as:  TOPROL-XL Take 1 tablet (50 mg total) by mouth daily.   omeprazole 20 MG capsule Commonly known as:  PRILOSEC Take 40 mg by mouth daily.   polyethylene glycol packet Commonly known as:  MIRALAX / GLYCOLAX Take 17 g by mouth daily. What changed:  when to take this   potassium chloride SA 20 MEQ tablet Commonly known as:  K-DUR,KLOR-CON Take 1 tablet (20 mEq total) by mouth daily.   pramipexole 0.25 MG tablet Commonly known as:  MIRAPEX Take 1.5 tablets by mouth three times daily   RESOURCE THICKENUP CLEAR Powd Take 120 g by mouth as needed.   sodium phosphate 7-19 GM/118ML Enem Place  133 mLs (1 enema total) rectally once as needed for severe constipation.   traMADol 50 MG tablet Commonly known as:  ULTRAM Take 50 mg by mouth every 6 (six) hours as needed for moderate pain.      Follow-up Information    Gaynelle Arabian, MD. Call.   Specialty:  Orthopedic Surgery Why:  Please call office ASAP at [phone] to set appointment on Tuesday 5/29 with Dr. Wynelle Link. Contact information: [address] [place] [address] [phone]           Signed: Arlee Muslim, PA-C Orthopaedic Surgery 07/08/2016, 9:11 AM

## 2016-07-08 NOTE — Progress Notes (Signed)
Report given to Wells Guiles at [facility]. No questions or concerns at this time.  Paperwork sent with [facility]. Patient transported via [facility].

## 2016-07-08 NOTE — Progress Notes (Signed)
Date: Jul 08, 2016  Discharge orders review for case management needs.  None found  Velva Harman, BSN, [place], [place]:  [phone]

## 2016-07-09 ENCOUNTER — Observation Stay (HOSPITAL_COMMUNITY)
Admission: EM | Admit: 2016-07-09 | Discharge: 2016-07-10 | Disposition: A | Discharge status: Skilled Nursing Facility | Payer: No Typology Code available for payment source | Attending: Internal Medicine | Admitting: Internal Medicine

## 2016-07-09 ENCOUNTER — Encounter (HOSPITAL_COMMUNITY): Payer: Self-pay | Admitting: *Deleted

## 2016-07-09 ENCOUNTER — Emergency Department (HOSPITAL_COMMUNITY): Payer: No Typology Code available for payment source

## 2016-07-09 DIAGNOSIS — Z882 Allergy status to sulfonamides status: Secondary | ICD-10-CM | POA: Insufficient documentation

## 2016-07-09 DIAGNOSIS — S0990XA Unspecified injury of head, initial encounter: Secondary | ICD-10-CM | POA: Insufficient documentation

## 2016-07-09 DIAGNOSIS — F0390 Unspecified dementia without behavioral disturbance: Secondary | ICD-10-CM | POA: Insufficient documentation

## 2016-07-09 DIAGNOSIS — Z794 Long term (current) use of insulin: Secondary | ICD-10-CM

## 2016-07-09 DIAGNOSIS — Z833 Family history of diabetes mellitus: Secondary | ICD-10-CM | POA: Insufficient documentation

## 2016-07-09 DIAGNOSIS — E119 Type 2 diabetes mellitus without complications: Secondary | ICD-10-CM | POA: Diagnosis not present

## 2016-07-09 DIAGNOSIS — F418 Other specified anxiety disorders: Secondary | ICD-10-CM | POA: Diagnosis not present

## 2016-07-09 DIAGNOSIS — Z9889 Other specified postprocedural states: Secondary | ICD-10-CM | POA: Insufficient documentation

## 2016-07-09 DIAGNOSIS — Z993 Dependence on wheelchair: Secondary | ICD-10-CM | POA: Insufficient documentation

## 2016-07-09 DIAGNOSIS — R9431 Abnormal electrocardiogram [ECG] [EKG]: Secondary | ICD-10-CM | POA: Diagnosis not present

## 2016-07-09 DIAGNOSIS — M9711XA 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, initial encounter: Secondary | ICD-10-CM

## 2016-07-09 DIAGNOSIS — Z90710 Acquired absence of both cervix and uterus: Secondary | ICD-10-CM | POA: Insufficient documentation

## 2016-07-09 DIAGNOSIS — Z96651 Presence of right artificial knee joint: Secondary | ICD-10-CM | POA: Insufficient documentation

## 2016-07-09 DIAGNOSIS — I10 Essential (primary) hypertension: Secondary | ICD-10-CM

## 2016-07-09 DIAGNOSIS — D62 Acute posthemorrhagic anemia: Secondary | ICD-10-CM | POA: Diagnosis not present

## 2016-07-09 DIAGNOSIS — F341 Dysthymic disorder: Secondary | ICD-10-CM | POA: Diagnosis present

## 2016-07-09 DIAGNOSIS — G2581 Restless legs syndrome: Secondary | ICD-10-CM | POA: Insufficient documentation

## 2016-07-09 DIAGNOSIS — D591 Other autoimmune hemolytic anemias: Secondary | ICD-10-CM | POA: Diagnosis not present

## 2016-07-09 DIAGNOSIS — I119 Hypertensive heart disease without heart failure: Secondary | ICD-10-CM | POA: Insufficient documentation

## 2016-07-09 DIAGNOSIS — E7800 Pure hypercholesterolemia, unspecified: Secondary | ICD-10-CM | POA: Insufficient documentation

## 2016-07-09 DIAGNOSIS — R0602 Shortness of breath: Secondary | ICD-10-CM | POA: Diagnosis not present

## 2016-07-09 DIAGNOSIS — Z8249 Family history of ischemic heart disease and other diseases of the circulatory system: Secondary | ICD-10-CM | POA: Insufficient documentation

## 2016-07-09 DIAGNOSIS — Z885 Allergy status to narcotic agent status: Secondary | ICD-10-CM | POA: Insufficient documentation

## 2016-07-09 DIAGNOSIS — D649 Anemia, unspecified: Secondary | ICD-10-CM

## 2016-07-09 DIAGNOSIS — Z79899 Other long term (current) drug therapy: Secondary | ICD-10-CM | POA: Insufficient documentation

## 2016-07-09 DIAGNOSIS — K219 Gastro-esophageal reflux disease without esophagitis: Secondary | ICD-10-CM | POA: Insufficient documentation

## 2016-07-09 DIAGNOSIS — M9711XS 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, sequela: Secondary | ICD-10-CM

## 2016-07-09 DIAGNOSIS — E1165 Type 2 diabetes mellitus with hyperglycemia: Secondary | ICD-10-CM

## 2016-07-09 DIAGNOSIS — R32 Unspecified urinary incontinence: Secondary | ICD-10-CM | POA: Insufficient documentation

## 2016-07-09 DIAGNOSIS — Z66 Do not resuscitate: Secondary | ICD-10-CM | POA: Insufficient documentation

## 2016-07-09 DIAGNOSIS — W050XXA Fall from non-moving wheelchair, initial encounter: Secondary | ICD-10-CM | POA: Insufficient documentation

## 2016-07-09 DIAGNOSIS — M1990 Unspecified osteoarthritis, unspecified site: Secondary | ICD-10-CM | POA: Insufficient documentation

## 2016-07-09 DIAGNOSIS — Z87440 Personal history of urinary (tract) infections: Secondary | ICD-10-CM | POA: Insufficient documentation

## 2016-07-09 LAB — CBC WITH DIFFERENTIAL/PLATELET
BASOS ABS: 0 10*3/uL (ref 0.0–0.1)
Basophils Relative: 0 %
Eosinophils Absolute: 0 10*3/uL (ref 0.0–0.7)
Eosinophils Relative: 0 %
HEMATOCRIT: 19.9 % — AB (ref 36.0–46.0)
HEMOGLOBIN: 6.6 g/dL — AB (ref 12.0–15.0)
LYMPHS PCT: 17 %
Lymphs Abs: 1.4 10*3/uL (ref 0.7–4.0)
MCH: 26.9 pg (ref 26.0–34.0)
MCHC: 33.2 g/dL (ref 30.0–36.0)
MCV: 81.2 fL (ref 78.0–100.0)
Monocytes Absolute: 0.6 10*3/uL (ref 0.1–1.0)
Monocytes Relative: 7 %
NEUTROS ABS: 5.9 10*3/uL (ref 1.7–7.7)
NEUTROS PCT: 75 %
Platelets: 212 10*3/uL (ref 150–400)
RBC: 2.45 MIL/uL — AB (ref 3.87–5.11)
RDW: 15.2 % (ref 11.5–15.5)
WBC: 7.9 10*3/uL (ref 4.0–10.5)

## 2016-07-09 LAB — BASIC METABOLIC PANEL
ANION GAP: 7 (ref 5–15)
BUN: 21 mg/dL — ABNORMAL HIGH (ref 6–20)
CO2: 25 mmol/L (ref 22–32)
Calcium: 8.3 mg/dL — ABNORMAL LOW (ref 8.9–10.3)
Chloride: 105 mmol/L (ref 101–111)
Creatinine, Ser: 0.87 mg/dL (ref 0.44–1.00)
GFR calc Af Amer: >60 mL/min (ref >60)
GFR calc non Af Amer: >60 mL/min (ref >60)
GLUCOSE: 361 mg/dL — AB (ref 65–99)
POTASSIUM: 3.8 mmol/L (ref 3.5–5.1)
Sodium: 137 mmol/L (ref 135–145)

## 2016-07-09 LAB — I-STAT TROPONIN, ED: TROPONIN I, POC: 0 ng/mL (ref 0.00–0.08)

## 2016-07-09 LAB — RETICULOCYTES
RBC.: 2.41 MIL/uL — ABNORMAL LOW (ref 3.87–5.11)
RETIC COUNT ABSOLUTE: 84.4 10*3/uL (ref 19.0–186.0)
Retic Ct Pct: 3.5 % — ABNORMAL HIGH (ref 0.4–3.1)

## 2016-07-09 LAB — GLUCOSE, CAPILLARY: Glucose-Capillary: 330 mg/dL — ABNORMAL HIGH (ref 65–99)

## 2016-07-09 LAB — POC OCCULT BLOOD, ED: Fecal Occult Bld: NEGATIVE

## 2016-07-09 LAB — PREPARE RBC (CROSSMATCH)

## 2016-07-09 MED ORDER — INSULIN DETEMIR 100 UNIT/ML ~~LOC~~ SOLN
10.0000 [IU] | Freq: Every day | SUBCUTANEOUS | Status: DC
  Administered 2016-07-09: 10 [IU] via SUBCUTANEOUS
  Filled 2016-07-09 (×2): qty 0.1

## 2016-07-09 MED ORDER — DULOXETINE HCL 30 MG PO CPEP
30.0000 mg | ORAL_CAPSULE | Freq: Two times a day (BID) | ORAL | Status: DC
  Administered 2016-07-09 – 2016-07-10 (×2): 30 mg via ORAL
  Filled 2016-07-09 (×2): qty 1

## 2016-07-09 MED ORDER — ALPRAZOLAM 0.25 MG PO TABS
0.2500 mg | ORAL_TABLET | Freq: Two times a day (BID) | ORAL | Status: DC | PRN
  Administered 2016-07-10: 0.25 mg via ORAL
  Filled 2016-07-09: qty 1

## 2016-07-09 MED ORDER — IBUPROFEN 200 MG PO TABS: 400.0000 mg | ORAL_TABLET | ORAL | Status: DC | PRN

## 2016-07-09 MED ORDER — SODIUM CHLORIDE 0.9 % IV SOLN
Freq: Once | INTRAVENOUS | Status: AC
  Administered 2016-07-09: 17:00:00 via INTRAVENOUS

## 2016-07-09 MED ORDER — INSULIN ASPART 100 UNIT/ML ~~LOC~~ SOLN
0.0000 [IU] | Freq: Three times a day (TID) | SUBCUTANEOUS | Status: DC
  Administered 2016-07-10 (×2): 3 [IU] via SUBCUTANEOUS

## 2016-07-09 MED ORDER — PRAMIPEXOLE DIHYDROCHLORIDE 0.25 MG PO TABS
0.5000 mg | ORAL_TABLET | Freq: Every day | ORAL | Status: DC
  Administered 2016-07-09: 0.5 mg via ORAL
  Filled 2016-07-09: qty 2

## 2016-07-09 MED ORDER — HYDROMORPHONE HCL 2 MG PO TABS
1.0000 mg | ORAL_TABLET | ORAL | Status: DC | PRN
  Administered 2016-07-09 – 2016-07-10 (×2): 1 mg via ORAL
  Filled 2016-07-09 (×2): qty 1

## 2016-07-09 MED ORDER — AMLODIPINE BESYLATE 5 MG PO TABS
5.0000 mg | ORAL_TABLET | Freq: Every day | ORAL | Status: DC
  Administered 2016-07-10: 5 mg via ORAL
  Filled 2016-07-09: qty 1

## 2016-07-09 MED ORDER — LOSARTAN POTASSIUM 50 MG PO TABS
100.0000 mg | ORAL_TABLET | Freq: Every day | ORAL | Status: DC
  Administered 2016-07-10: 100 mg via ORAL
  Filled 2016-07-09: qty 2

## 2016-07-09 MED ORDER — METOPROLOL SUCCINATE ER 50 MG PO TB24
50.0000 mg | ORAL_TABLET | Freq: Every day | ORAL | Status: DC
  Administered 2016-07-10: 50 mg via ORAL
  Filled 2016-07-09: qty 1

## 2016-07-09 MED ORDER — METHOCARBAMOL 500 MG PO TABS: 500.0000 mg | ORAL_TABLET | Freq: Four times a day (QID) | ORAL | Status: DC | PRN

## 2016-07-09 MED ORDER — SODIUM CHLORIDE 0.9% FLUSH: 3.0000 mL | Freq: Two times a day (BID) | INTRAVENOUS | Status: DC

## 2016-07-09 MED ORDER — SODIUM CHLORIDE 0.9 % IV SOLN: 250.0000 mL | INTRAVENOUS | Status: DC | PRN

## 2016-07-09 MED ORDER — HYDROMORPHONE HCL 1 MG/ML IJ SOLN
0.5000 mg | Freq: Once | INTRAMUSCULAR | Status: AC
  Administered 2016-07-09: 0.5 mg via INTRAVENOUS
  Filled 2016-07-09: qty 0.5

## 2016-07-09 MED ORDER — PANTOPRAZOLE SODIUM 40 MG PO TBEC
40.0000 mg | DELAYED_RELEASE_TABLET | Freq: Every day | ORAL | Status: DC
  Administered 2016-07-10: 40 mg via ORAL
  Filled 2016-07-09: qty 1

## 2016-07-09 MED ORDER — SODIUM CHLORIDE 0.9% FLUSH
3.0000 mL | Freq: Two times a day (BID) | INTRAVENOUS | Status: DC
  Administered 2016-07-10: 3 mL via INTRAVENOUS

## 2016-07-09 MED ORDER — POLYETHYLENE GLYCOL 3350 17 G PO PACK
17.0000 g | PACK | Freq: Every day | ORAL | Status: DC
  Administered 2016-07-10: 17 g via ORAL
  Filled 2016-07-09: qty 1

## 2016-07-09 MED ORDER — INSULIN ASPART 100 UNIT/ML ~~LOC~~ SOLN
0.0000 [IU] | Freq: Every day | SUBCUTANEOUS | Status: DC
  Administered 2016-07-09: 4 [IU] via SUBCUTANEOUS

## 2016-07-09 MED ORDER — HYDROMORPHONE HCL 2 MG PO TABS: 2.0000 mg | ORAL_TABLET | ORAL | Status: DC | PRN

## 2016-07-09 MED ORDER — DOCUSATE SODIUM 100 MG PO CAPS
100.0000 mg | ORAL_CAPSULE | Freq: Two times a day (BID) | ORAL | Status: DC
  Administered 2016-07-09 – 2016-07-10 (×2): 100 mg via ORAL
  Filled 2016-07-09 (×2): qty 1

## 2016-07-09 MED ORDER — ACETAMINOPHEN 325 MG PO TABS: 650.0000 mg | ORAL_TABLET | Freq: Four times a day (QID) | ORAL | Status: DC | PRN

## 2016-07-09 MED ORDER — ENSURE ENLIVE PO LIQD
237.0000 mL | Freq: Two times a day (BID) | ORAL | Status: DC
  Administered 2016-07-10: 237 mL via ORAL

## 2016-07-09 MED ORDER — MAGNESIUM OXIDE 400 (241.3 MG) MG PO TABS
800.0000 mg | ORAL_TABLET | Freq: Every day | ORAL | Status: DC
  Administered 2016-07-10: 800 mg via ORAL
  Filled 2016-07-09: qty 2

## 2016-07-09 MED ORDER — METHOCARBAMOL 500 MG PO TABS
500.0000 mg | ORAL_TABLET | Freq: Four times a day (QID) | ORAL | Status: DC | PRN
  Administered 2016-07-09 – 2016-07-10 (×3): 500 mg via ORAL
  Filled 2016-07-09 (×3): qty 1

## 2016-07-09 MED ORDER — ONDANSETRON HCL 4 MG/2ML IJ SOLN
4.0000 mg | Freq: Four times a day (QID) | INTRAMUSCULAR | Status: DC | PRN
  Administered 2016-07-10: 4 mg via INTRAVENOUS
  Filled 2016-07-09: qty 2

## 2016-07-09 MED ORDER — ACETAMINOPHEN 325 MG PO TABS
650.0000 mg | ORAL_TABLET | Freq: Two times a day (BID) | ORAL | Status: DC
  Administered 2016-07-09 – 2016-07-10 (×2): 650 mg via ORAL
  Filled 2016-07-09 (×2): qty 2

## 2016-07-09 MED ORDER — LORATADINE 10 MG PO TABS
10.0000 mg | ORAL_TABLET | Freq: Every day | ORAL | Status: DC
  Administered 2016-07-10: 10 mg via ORAL
  Filled 2016-07-09: qty 1

## 2016-07-09 MED ORDER — ACETAMINOPHEN 650 MG RE SUPP: 650.0000 mg | Freq: Four times a day (QID) | RECTAL | Status: DC | PRN

## 2016-07-09 MED ORDER — SODIUM CHLORIDE 0.9% FLUSH: 3.0000 mL | INTRAVENOUS | Status: DC | PRN

## 2016-07-09 MED ORDER — ONDANSETRON HCL 4 MG PO TABS: 4.0000 mg | ORAL_TABLET | Freq: Four times a day (QID) | ORAL | Status: DC | PRN

## 2016-07-09 MED ORDER — BISACODYL 10 MG RE SUPP: 10.0000 mg | Freq: Every day | RECTAL | Status: DC | PRN

## 2016-07-09 MED ORDER — INSULIN DETEMIR 100 UNIT/ML FLEXPEN: 12.0000 [IU] | PEN_INJECTOR | Freq: Every day | SUBCUTANEOUS | Status: DC

## 2016-07-09 NOTE — ED Triage Notes (Signed)
Pt was sent to ED by EMS for anemia after her Hbg was found to be 7.3 on labs drawn this am.  Pt is a resident at [facility].  She sustained a fracture of her Right femur fracture on 5/14 and has surgical dressing in place on her right knee with knee immobilizer in place.

## 2016-07-09 NOTE — ED Notes (Signed)
Checked in room, pt is not in room

## 2016-07-09 NOTE — ED Provider Notes (Signed)
[facility] DEPT Provider Note   CSN: [id number] Arrival date & time: 07/09/16  1418     History   Chief Complaint Chief Complaint  Patient presents with  . Abnormal Lab    Hgb 7.3    HPI [name] is a 76 y.o. female.  The history is provided by the patient, the nursing home and a relative. No language interpreter was used.  Abnormal Lab    [name] is a 76 y.o. female who presents to the Emergency Department complaining of abnormal lab.  Level V caveat due to dementia.  History is provided by the patient's daughter and the nursing home staff. This Feighner was admitted to the hospital on May 14 following a fall out of her wheelchair where she sustained a fracture to her right knee. She was admitted and had surgery performed on May 16 to repair her fracture. Her last CBC was obtained prior to the surgery and she had a hemoglobin of 10. Today she had an episode this morning when she was feeling short of breath and they checked a CBC and her hemoglobin was 7.4 and she was referred to the emergency department for further evaluation. No postsurgical CBC was obtained. In the emergency department she denies any complaints of fevers, cough, shortness of breath, chest pain, bone pain, nausea, vomiting, black or bloody stools. Currently on Lovenox for DVT prophylaxis as well as aspirin.  Past Medical History:  Diagnosis Date  . Anxiety   . Depression   . Diabetes mellitus   . Dyspnea   . GERD (gastroesophageal reflux disease)   . Headache(784.0)   . HTN (hypertension)    dr Johnsie Cancel  . Hypercholesteremia    DENIES  . Osteoarthritis   . PONV (postoperative nausea and vomiting)   . RLS (restless legs syndrome)   . Stress     Patient Active Problem List   Diagnosis Date Noted  . Acute post-hemorrhagic anemia 07/09/2016  . Symptomatic anemia 07/09/2016  . 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 Abdominal pain, chronic,  epigastric 08/10/2015  . Dehydration   . Leukocytosis   . Aspiration pneumonia ([place]) 07/21/2014  . DM type 2 (diabetes mellitus, type 2) ([place]) 07/21/2014  . Palliative care encounter 07/21/2014  . Dysphagia 07/09/2014  . Decubitus ulcer 07/09/2014  . Blood poisoning   . Acute respiratory failure with hypoxia ([place]) 06/30/2014  . Septic shock ([place]) 06/30/2014  . Lactic acidosis 06/30/2014  . Dementia 06/30/2014  . Acute respiratory failure with hypoxemia ([place])   . Hypernatremia   . Acute kidney injury ([place]) 05/28/2014  . Diabetes ([place]) 05/28/2014  . Fever 05/27/2014  . UTI (lower urinary tract infection) 05/27/2014  . Sepsis ([place]) 05/27/2014  . Acute encephalopathy   . FTT (failure to thrive) in adult   . Gait instability 07/18/2013    Class: Acute  . Low back pain 07/18/2013    Class: Acute  . Nausea with vomiting 07/18/2013    Class: Acute  . Abnormality of gait 04/29/2013  . 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 Head trauma 06/03/2010  . HYPERCHOLESTEROLEMIA 05/15/2008  . ANXIETY DEPRESSION 05/15/2008  . DEPRESSION 05/15/2008  . Essential hypertension 05/15/2008  . OSTEOARTHRITIS 05/15/2008  . Edema 05/15/2008  . DYSPNEA ON EXERTION 05/15/2008  . ELECTROCARDIOGRAM, ABNORMAL 05/15/2008  . DIABETES MELLITUS, TYPE II, HX OF 05/15/2008    Past Surgical History:  Procedure Laterality Date  . ABDOMINAL HYSTERECTOMY    .  COLONOSCOPY    . left forearm fracture with ORIF Left   . LUMBAR LAMINECTOMY/DECOMPRESSION MICRODISCECTOMY N/A 04/18/2012   Procedure: LUMBAR LAMINECTOMY/DECOMPRESSION MICRODISCECTOMY 3 LEVELS;  Surgeon: Winfield Cunas, MD;  Location: [facility];  Service: Neurosurgery;  Laterality: N/A;  Lumbar three-four, lumbar four-five, lumbar five-sacral one decompression. Synovial cyst resection lumbar four-five  . ORIF PERIPROSTHETIC FRACTURE Right 07/06/2016   Procedure: OPEN REDUCTION INTERNAL FIXATION (ORIF) PERIPROSTHETIC  FRACTURE;  Surgeon: Gaynelle Arabian, MD;  Location: [facility];  Service: Orthopedics;  Laterality: Right;  . REPLACEMENT TOTAL KNEE Right   . WISDOM TOOTH EXTRACTION      OB History    No data available       Home Medications    Prior to Admission medications   Medication Sig Start Date End Date Taking? Authorizing Provider  amLODipine (NORVASC) 5 MG tablet Take 1 tablet (5 mg total) by mouth daily. 07/14/14  Yes Ghimire, Henreitta Leber, MD  Cranberry-Vitamin C-Probiotic (AZO CRANBERRY) 250-30 MG TABS Take 1 tablet by mouth daily.   Yes [provider]  docusate sodium (COLACE) 100 MG capsule Take 100 mg by mouth 2 (two) times daily.   Yes [provider]  DULoxetine (CYMBALTA) 30 MG capsule Take 30 mg by mouth 2 (two) times daily. 06/13/16  Yes [provider]  enoxaparin (LOVENOX) 40 MG/0.4ML injection Inject 0.4 mLs (40 mg total) into the skin daily. Lovenox 40 mg sub-Q injection daily for a total of two weeks, then discontinue the Lovenox and resume the baby 81 mg Aspirin daily. 07/09/16  Yes Perkins, Alexzandrew L, PA-C  feeding supplement, ENSURE ENLIVE, (ENSURE ENLIVE) LIQD Take 237 mLs by mouth 2 (two) times daily between meals. 07/08/16  Yes Perkins, Alexzandrew L, PA-C  HYDROmorphone (DILAUDID) 2 MG tablet Take 1-2 tablets (2-4 mg total) by mouth every 4 (four) hours as needed for severe pain. 07/08/16  Yes Perkins, Alexzandrew L, PA-C  Insulin Detemir (LEVEMIR FLEXTOUCH) 100 UNIT/ML Pen Inject 5-12 Units into the skin 2 (two) times daily. Inject 12 units in the morning and Inject 5 units at bedtime   Yes [provider]  loratadine (CLARITIN) 10 MG tablet Take 10 mg by mouth daily.    Yes [provider]  losartan (COZAAR) 100 MG tablet Take 100 mg by mouth daily.   Yes [provider]  Magnesium Oxide 500 MG TABS Take 500 mg by mouth daily.    Yes [provider]  methocarbamol (ROBAXIN) 500 MG tablet Take 1 tablet (500 mg total)  by mouth every 6 (six) hours as needed for muscle spasms. 07/08/16  Yes Perkins, Alexzandrew L, PA-C  metoprolol succinate (TOPROL-XL) 50 MG 24 hr tablet Take 1 tablet (50 mg total) by mouth daily. 07/04/14  Yes Robbie Lis, MD  omeprazole (PRILOSEC) 40 MG capsule Take 40 mg by mouth daily.   Yes [provider]  polyethylene glycol (MIRALAX / GLYCOLAX) packet Take 17 g by mouth daily. 07/26/13  Yes [name], MD  potassium bicarbonate (K-LYTE) 25 MEQ disintegrating tablet Take 25 mEq by mouth daily.   Yes [provider]  pramipexole (MIRAPEX) 0.25 MG tablet Take 1.5 tablets by mouth three times daily   Yes [provider]  acetaminophen (TYLENOL) 500 MG tablet Take 500 mg by mouth every 8 (eight) hours as needed for mild pain.    [provider]  acetaminophen (TYLENOL) 650 MG CR tablet Take 650 mg by mouth 2 (two) times daily.  [provider]  ALPRAZolam Duanne Moron) 0.25 MG tablet Take 0.25 mg by mouth 2 (two) times daily as needed for anxiety.    [provider]  bisacodyl (DULCOLAX) 10 MG suppository Place 1 suppository (10 mg total) rectally daily as needed for moderate constipation. 07/08/16   Perkins, Alexzandrew L, PA-C  hydrocortisone cream 1 % Apply 1 application topically daily as needed for itching.    [provider]  insulin aspart (NOVOLOG) 100 UNIT/ML injection Inject 0-9 Units into the skin 3 (three) times daily with meals. CBG < 70: implement hypoglycemia protocol CBG 70 - 120: 0 units CBG 121 - 150: 1 unit CBG 151 - 200: 2 units CBG 201 - 250: 3 units CBG 251 - 300: 5 units CBG 301 - 350: 7 units CBG 351 - 400: 9 units CBG > 400: call MD Patient not taking: Reported on 07/09/2016 07/14/14   Jonetta Osgood, MD  insulin detemir (LEVEMIR) 100 UNIT/ML injection Inject 0.05 mLs (5 Units total) into the skin at bedtime. Patient not taking: Reported on 07/09/2016 07/14/14   Jonetta Osgood, MD    Maltodextrin-Xanthan Gum (RESOURCE THICKENUP CLEAR) POWD Take 120 g by mouth as needed. Patient not taking: Reported on 08/10/2015 07/22/14   Robbie Lis, MD  potassium chloride SA (K-DUR,KLOR-CON) 20 MEQ tablet Take 1 tablet (20 mEq total) by mouth daily. Patient not taking: Reported on 07/09/2016 07/22/14   Robbie Lis, MD  sodium phosphate (FLEET) 7-19 GM/118ML ENEM Place 133 mLs (1 enema total) rectally once as needed for severe constipation. Patient not taking: Reported on 07/09/2016 07/08/16   Joelene Millin, PA-C    Family History Family History  Problem Relation Age of Onset  . Diabetes Father   . High blood pressure Father   . High blood pressure Mother   . Colon cancer Neg Hx   . Esophageal cancer Neg Hx   . Liver disease Neg Hx   . Stomach cancer Neg Hx   . Pancreatic cancer Neg Hx     Social History Social History  Substance Use Topics  . Smoking status: Never Smoker  . Smokeless tobacco: Never Used  . Alcohol use No     Allergies   Morphine and related; Oxycodone; and Sulfonamide derivatives   Review of Systems Review of Systems  Unable to perform ROS: Dementia     Physical Exam Updated Vital Signs BP 134/75   Pulse 80   Temp 99.1 F (37.3 C) (Oral)   Resp (!) 24   Wt 230 lb (104.3 kg)   SpO2 95%   BMI 36.02 kg/m   Physical Exam  Constitutional: She appears well-developed and well-nourished.  HENT:  Head: Normocephalic and atraumatic.  Cardiovascular: Normal rate and regular rhythm.   No murmur heard. Pulmonary/Chest: Effort normal and breath sounds normal. No respiratory distress.  Abdominal: Soft. There is no tenderness. There is no rebound and no guarding.  Genitourinary:  Genitourinary Comments: External hemorrhoids with brown stool, nontender rectal exam  Musculoskeletal:  Surgical dressing to right anterior knee is clean, dry, intact. 1+ pitting edema to bilateral lower extremities, slightly greater on the right when compared  to left.  Neurological: She is alert.  Pleasantly confused. Disoriented to place and time.  Skin: Skin is warm and dry. There is pallor.  Psychiatric: She has a normal mood and affect. Her behavior is normal.  Nursing note and vitals reviewed.    ED Treatments / Results  Labs (all labs ordered  are listed, but only abnormal results are displayed) Labs Reviewed  CBC WITH DIFFERENTIAL/PLATELET - Abnormal; Notable for the following:       Result Value   RBC 2.45 (*)    Hemoglobin 6.6 (*)    HCT 19.9 (*)    All other components within normal limits  BASIC METABOLIC PANEL - Abnormal; Notable for the following:    Glucose, Bld 361 (*)    BUN 21 (*)    Calcium 8.3 (*)    All other components within normal limits  VITAMIN B12  FOLATE  IRON AND TIBC  FERRITIN  RETICULOCYTES  POC OCCULT BLOOD, ED  I-STAT TROPOININ, ED  TYPE AND SCREEN  PREPARE RBC (CROSSMATCH)    EKG  EKG Interpretation  Date/Time:  Saturday Jul 09 2016 16:36:25 EDT Ventricular Rate:  75 PR Interval:    QRS Duration: 98 QT Interval:  408 QTC Calculation: 456 R Axis:   -21 Text Interpretation:  Sinus rhythm Atrial premature complex LVH with secondary repolarization abnormality Baseline wander in lead(s) V6 Confirmed by Hazle Coca [phone]) on 07/09/2016 4:48:23 PM       Radiology Dg Chest 2 View  Result Date: 07/09/2016 CLINICAL DATA:  Shortness of breath. EXAM: CHEST  2 VIEW COMPARISON:  Jul 04, 2016 FINDINGS: Mild cardiomegaly. The hila and mediastinum are unchanged. No pulmonary nodules, masses, or focal infiltrates. No acute abnormalities are identified. IMPRESSION: No active cardiopulmonary disease. Electronically Signed   By: Dorise Bullion III M.D   On: 07/09/2016 15:40    Procedures Procedures (including critical care time)  Medications Ordered in ED Medications  sodium chloride flush (NS) 0.9 % injection 3 mL (not administered)  sodium chloride flush (NS) 0.9 % injection 3 mL (not administered)   sodium chloride flush (NS) 0.9 % injection 3 mL (not administered)  0.9 %  sodium chloride infusion (not administered)  acetaminophen (TYLENOL) tablet 650 mg (not administered)    Or  acetaminophen (TYLENOL) suppository 650 mg (not administered)  ondansetron (ZOFRAN) tablet 4 mg (not administered)    Or  ondansetron (ZOFRAN) injection 4 mg (not administered)  acetaminophen (TYLENOL) tablet 650 mg (not administered)  ALPRAZolam (XANAX) tablet 0.25 mg (not administered)  amLODipine (NORVASC) tablet 5 mg (not administered)  bisacodyl (DULCOLAX) suppository 10 mg (not administered)  docusate sodium (COLACE) capsule 100 mg (not administered)  DULoxetine (CYMBALTA) DR capsule 30 mg (not administered)  feeding supplement (ENSURE ENLIVE) (ENSURE ENLIVE) liquid 237 mL (not administered)  HYDROmorphone (DILAUDID) tablet 2-4 mg (not administered)  Insulin Detemir (LEVEMIR) FlexPen 12 Units (not administered)  loratadine (CLARITIN) tablet 10 mg (not administered)  losartan (COZAAR) tablet 100 mg (not administered)  methocarbamol (ROBAXIN) tablet 500 mg (not administered)  Magnesium Oxide TABS 500 mg (not administered)  metoprolol succinate (TOPROL-XL) 24 hr tablet 50 mg (not administered)  pantoprazole (PROTONIX) EC tablet 40 mg (not administered)  polyethylene glycol (MIRALAX / GLYCOLAX) packet 17 g (not administered)  pramipexole (MIRAPEX) tablet 0.5 mg (not administered)  0.9 %  sodium chloride infusion ( Intravenous New Bag/Given 07/09/16 1659)  HYDROmorphone (DILAUDID) injection 0.5 mg (0.5 mg Intravenous Given 07/09/16 1659)     Initial Impression / Assessment and Plan / ED Course  I have reviewed the triage vital signs and the nursing notes.  Pertinent labs & imaging results that were available during my care of the patient were reviewed by me and considered in my medical decision making (see chart for details).   patient referred to the emergency department  for evaluation of shortness  of breath and anemia on labs. She did not have hemoglobin checked after her femur repair.  Preoperative hemoglobin was 10. Outpatient labs morning demonstrated a hemoglobin of 7.3, recheck today is 6.6 in the emergency department. The patient denies symptoms currently but family reports she has been expressing shortness of breath. She has no clear evidence of active bleeding in the department. Plan to transfuse for symptomatic anemia. Hospitalist consulted for admission.  Final Clinical Impressions(s) / ED Diagnoses   Final diagnoses:  Symptomatic anemia    New Prescriptions New Prescriptions   No medications on file     Quintella Reichert, MD 07/09/16 1739

## 2016-07-09 NOTE — H&P (Addendum)
History and Physical    [facility] NAT:[id number] DOB: March 25, 1940 DOA: 07/09/2016    PCP: [name], MD  Patient coming from: SNF  Chief Complaint: sent for anemia  HPI: [name] is a 76 y.o. female with medical history of DM 2, HTN, dementia who fell out of her wheelchair this past week and sustained a right distal femoral periprosthetic fracture which was repaired on Wed. She was discharged home yesterday. She is sent back to the hospital for a Hb of 7.3. This was checked due to a complaint of shortness of breath. In the ER the Hb is found to be 6.6. The patient is not complaining of dyspnea to me. Her daughter gives the history and states that her mother has dementia, is wheelchair bound and has had some increase in confusion since this knee surgery. She states that Hydrocodone and Oxycodone and Morphine cause her to hallucinate.   ED Course: CBG 361, temp 99.1, CXR unrevealing.  Review of Systems:  Unable to obtain due to confusion.  All other systems reviewed and apart from HPI, are negative.  Past Medical History:  Diagnosis Date  . Anxiety   . Depression   . Diabetes mellitus   . Dyspnea   . GERD (gastroesophageal reflux disease)   . Headache(784.0)   . HTN (hypertension)    dr Johnsie Cancel  . Hypercholesteremia    DENIES  . Osteoarthritis   . PONV (postoperative nausea and vomiting)   . RLS (restless legs syndrome)   . Stress     Past Surgical History:  Procedure Laterality Date  . ABDOMINAL HYSTERECTOMY    . COLONOSCOPY    . left forearm fracture with ORIF Left   . LUMBAR LAMINECTOMY/DECOMPRESSION MICRODISCECTOMY N/A 04/18/2012   Procedure: LUMBAR LAMINECTOMY/DECOMPRESSION MICRODISCECTOMY 3 LEVELS;  Surgeon: Winfield Cunas, MD;  Location: [facility];  Service: Neurosurgery;  Laterality: N/A;  Lumbar three-four, lumbar four-five, lumbar five-sacral one decompression. Synovial cyst resection lumbar four-five  . ORIF PERIPROSTHETIC FRACTURE Right  07/06/2016   Procedure: OPEN REDUCTION INTERNAL FIXATION (ORIF) PERIPROSTHETIC FRACTURE;  Surgeon: Gaynelle Arabian, MD;  Location: [facility];  Service: Orthopedics;  Laterality: Right;  . REPLACEMENT TOTAL KNEE Right   . WISDOM TOOTH EXTRACTION      Social History:  Lives at SNF, does not smoke or drink. Bed and wheelchair bound, incontinent of urine.  Allergies  Allergen Reactions  . Morphine And Related Other (See Comments)    hallucination   . Oxycodone Other (See Comments)    hallucination   . Sulfonamide Derivatives Swelling    Family History  Problem Relation Age of Onset  . Diabetes Father   . High blood pressure Father   . High blood pressure Mother   . Colon cancer Neg Hx   . Esophageal cancer Neg Hx   . Liver disease Neg Hx   . Stomach cancer Neg Hx   . Pancreatic cancer Neg Hx      Prior to Admission medications   Medication Sig Start Date End Date Taking? Authorizing Provider  amLODipine (NORVASC) 5 MG tablet Take 1 tablet (5 mg total) by mouth daily. 07/14/14  Yes Ghimire, Henreitta Leber, MD  Cranberry-Vitamin C-Probiotic (AZO CRANBERRY) 250-30 MG TABS Take 1 tablet by mouth daily.   Yes [provider]  docusate sodium (COLACE) 100 MG capsule Take 100 mg by mouth 2 (two) times daily.   Yes [provider]  DULoxetine (CYMBALTA) 30 MG capsule Take 30 mg by mouth  2 (two) times daily. 06/13/16  Yes [provider]  enoxaparin (LOVENOX) 40 MG/0.4ML injection Inject 0.4 mLs (40 mg total) into the skin daily. Lovenox 40 mg sub-Q injection daily for a total of two weeks, then discontinue the Lovenox and resume the baby 81 mg Aspirin daily. 07/09/16  Yes Perkins, Alexzandrew L, PA-C  feeding supplement, ENSURE ENLIVE, (ENSURE ENLIVE) LIQD Take 237 mLs by mouth 2 (two) times daily between meals. 07/08/16  Yes Perkins, Alexzandrew L, PA-C  HYDROmorphone (DILAUDID) 2 MG tablet Take 1-2 tablets (2-4 mg total) by mouth every 4 (four) hours as needed for severe  pain. 07/08/16  Yes Perkins, Alexzandrew L, PA-C  Insulin Detemir (LEVEMIR FLEXTOUCH) 100 UNIT/ML Pen Inject 5-12 Units into the skin 2 (two) times daily. Inject 12 units in the morning and Inject 5 units at bedtime   Yes [provider]  loratadine (CLARITIN) 10 MG tablet Take 10 mg by mouth daily.    Yes [provider]  losartan (COZAAR) 100 MG tablet Take 100 mg by mouth daily.   Yes [provider]  Magnesium Oxide 500 MG TABS Take 500 mg by mouth daily.    Yes [provider]  methocarbamol (ROBAXIN) 500 MG tablet Take 1 tablet (500 mg total) by mouth every 6 (six) hours as needed for muscle spasms. 07/08/16  Yes Perkins, Alexzandrew L, PA-C  metoprolol succinate (TOPROL-XL) 50 MG 24 hr tablet Take 1 tablet (50 mg total) by mouth daily. 07/04/14  Yes Robbie Lis, MD  omeprazole (PRILOSEC) 40 MG capsule Take 40 mg by mouth daily.   Yes [provider]  polyethylene glycol (MIRALAX / GLYCOLAX) packet Take 17 g by mouth daily. 07/26/13  Yes [name], MD  potassium bicarbonate (K-LYTE) 25 MEQ disintegrating tablet Take 25 mEq by mouth daily.   Yes [provider]  pramipexole (MIRAPEX) 0.25 MG tablet Take 1.5 tablets by mouth three times daily   Yes [provider]  acetaminophen (TYLENOL) 500 MG tablet Take 500 mg by mouth every 8 (eight) hours as needed for mild pain.    [provider]  acetaminophen (TYLENOL) 650 MG CR tablet Take 650 mg by mouth 2 (two) times daily.    [provider]  ALPRAZolam Duanne Moron) 0.25 MG tablet Take 0.25 mg by mouth 2 (two) times daily as needed for anxiety.    [provider]  bisacodyl (DULCOLAX) 10 MG suppository Place 1 suppository (10 mg total) rectally daily as needed for moderate constipation. 07/08/16   Perkins, Alexzandrew L, PA-C  hydrocortisone cream 1 % Apply 1 application topically daily as needed for itching.    [provider]  insulin aspart (NOVOLOG)  100 UNIT/ML injection Inject 0-9 Units into the skin 3 (three) times daily with meals. CBG < 70: implement hypoglycemia protocol CBG 70 - 120: 0 units CBG 121 - 150: 1 unit CBG 151 - 200: 2 units CBG 201 - 250: 3 units CBG 251 - 300: 5 units CBG 301 - 350: 7 units CBG 351 - 400: 9 units CBG > 400: call MD Patient not taking: Reported on 07/09/2016 07/14/14   Jonetta Osgood, MD  insulin detemir (LEVEMIR) 100 UNIT/ML injection Inject 0.05 mLs (5 Units total) into the skin at bedtime. Patient not taking: Reported on 07/09/2016 07/14/14   Jonetta Osgood, MD  Maltodextrin-Xanthan Gum (RESOURCE THICKENUP CLEAR) POWD Take 120 g by mouth as needed. Patient not taking: Reported on 08/10/2015 07/22/14   Robbie Lis,  MD  potassium chloride SA (K-DUR,KLOR-CON) 20 MEQ tablet Take 1 tablet (20 mEq total) by mouth daily. Patient not taking: Reported on 07/09/2016 07/22/14   Robbie Lis, MD  sodium phosphate (FLEET) 7-19 GM/118ML ENEM Place 133 mLs (1 enema total) rectally once as needed for severe constipation. Patient not taking: Reported on 07/09/2016 07/08/16   Joelene Millin, PA-C    Physical Exam: Wt Readings from Last 3 Encounters:  07/09/16 104.3 kg (230 lb)  07/04/16 95.3 kg (210 lb)  08/10/15 81.6 kg (180 lb)   Vitals:   07/09/16 1443 07/09/16 1500 07/09/16 1637 07/09/16 1700  BP: 119/61 137/69 (!) 140/129 134/75  Pulse: 80 75 83 80  Resp: (!) 22  18 (!) 24  Temp: 99.1 F (37.3 C)     TempSrc: Oral     SpO2: 94% 97% 96% 95%  Weight:          Constitutional: NAD, calm, comfortable Eyes: PERTLA, lids and conjunctivae normal ENMT: Mucous membranes are moist. Posterior pharynx clear of any exudate or lesions. Normal dentition.  Neck: normal, supple, no masses, no thyromegaly Respiratory: clear to auscultation bilaterally, no wheezing, no crackles. Normal respiratory effort. No accessory muscle use.  Cardiovascular: S1 & S2 heard, regular rate and rhythm, no murmurs /  rubs / gallops. No extremity edema. 2+ pedal pulses. No carotid bruits.  Abdomen: No distension, no tenderness, no masses palpated. No hepatosplenomegaly. Bowel sounds normal.  Musculoskeletal: no clubbing / cyanosis. Right knee and leg swollen.  Skin: no rashes, lesions, ulcers. No induration Neurologic: CN 2-12 grossly intact. Sensation intact, DTR normal. Strength 5/5 in all 4 limbs.  Psychiatric:  Alert and oriented x only to self    Labs on Admission: I have personally reviewed following labs and imaging studies  CBC:  Recent Labs Lab 07/04/16 1828 07/05/16 0630 07/06/16 0645 07/09/16 1555  WBC 14.7* 12.2* 13.1* 7.9  NEUTROABS 12.9*  --   --  5.9  HGB 12.8 11.8* 10.9* 6.6*  HCT 38.5 37.0 33.8* 19.9*  MCV 79.5 80.1 81.1 81.2  PLT 267 277 256 683   Basic Metabolic Panel:  Recent Labs Lab 07/04/16 1828 07/05/16 0630 07/06/16 0645 07/09/16 1555  NA 140 141 141 137  K 3.7 3.7 4.0 3.8  CL 104 105 106 105  CO2 27 25 24 25   GLUCOSE 285* 373* 329* 361*  BUN 19 21* 22* 21*  CREATININE 0.80 0.91 0.98 0.87  CALCIUM 9.3 9.4 9.2 8.3*   GFR: Estimated Creatinine Clearance: 67.3 mL/min (by C-G formula based on SCr of 0.87 mg/dL). Liver Function Tests:  Recent Labs Lab 07/05/16 0630  AST 13*  ALT 15  ALKPHOS 84  BILITOT 0.8  PROT 7.2  ALBUMIN 3.9   No results for input(s): LIPASE, AMYLASE in the last 168 hours. No results for input(s): AMMONIA in the last 168 hours. Coagulation Profile:  Recent Labs Lab 07/04/16 1828 07/05/16 0630  INR 0.95 1.04   Cardiac Enzymes: No results for input(s): CKTOTAL, CKMB, CKMBINDEX, TROPONINI in the last 168 hours. BNP (last 3 results) No results for input(s): PROBNP in the last 8760 hours. HbA1C: No results for input(s): HGBA1C in the last 72 hours. CBG:  Recent Labs Lab 07/07/16 0737 07/07/16 1221 07/07/16 1728 07/07/16 2103 07/08/16 0726  GLUCAP 370* 351* 265* 340* 337*   Lipid Profile: No results for  input(s): CHOL, HDL, LDLCALC, TRIG, CHOLHDL, LDLDIRECT in the last 72 hours. Thyroid Function Tests: No results for input(s): TSH, T4TOTAL,  FREET4, T3FREE, THYROIDAB in the last 72 hours. Anemia Panel: No results for input(s): VITAMINB12, FOLATE, FERRITIN, TIBC, IRON, RETICCTPCT in the last 72 hours. Urine analysis:    Component Value Date/Time   COLORURINE YELLOW 07/20/2014 2228   APPEARANCEUR CLOUDY (A) 07/20/2014 2228   LABSPEC 1.018 07/20/2014 2228   PHURINE 6.5 07/20/2014 2228   GLUCOSEU 250 (A) 07/20/2014 2228   HGBUR NEGATIVE 07/20/2014 2228   BILIRUBINUR NEGATIVE 07/20/2014 2228   KETONESUR NEGATIVE 07/20/2014 2228   PROTEINUR 100 (A) 07/20/2014 2228   UROBILINOGEN 0.2 07/20/2014 2228   NITRITE NEGATIVE 07/20/2014 2228   LEUKOCYTESUR SMALL (A) 07/20/2014 2228   Sepsis Labs: @LABRCNTIP (procalcitonin:4,lacticidven:4) ) Recent Results (from the past 240 hour(s))  MRSA PCR Screening     Status: None   Collection Time: 07/05/16  2:11 AM  Result Value Ref Range Status   MRSA by PCR NEGATIVE NEGATIVE Final    Comment:        The GeneXpert MRSA Assay (FDA approved for NASAL specimens only), is one component of a comprehensive MRSA colonization surveillance program. It is not intended to diagnose MRSA infection nor to guide or monitor treatment for MRSA infections.   Surgical pcr screen     Status: Abnormal   Collection Time: 07/06/16  3:06 PM  Result Value Ref Range Status   MRSA, PCR NEGATIVE NEGATIVE Final   Staphylococcus aureus POSITIVE (A) NEGATIVE Final    Comment:        The Xpert SA Assay (FDA approved for NASAL specimens in patients over 63 years of age), is one component of a comprehensive surveillance program.  Test performance has been validated by [facility] for patients greater than or equal to 48 year old. It is not intended to diagnose infection nor to guide or monitor treatment.      Radiological Exams on Admission: Dg Chest 2  View  Result Date: 07/09/2016 CLINICAL DATA:  Shortness of breath. EXAM: CHEST  2 VIEW COMPARISON:  Jul 04, 2016 FINDINGS: Mild cardiomegaly. The hila and mediastinum are unchanged. No pulmonary nodules, masses, or focal infiltrates. No acute abnormalities are identified. IMPRESSION: No active cardiopulmonary disease. Electronically Signed   By: Dorise Bullion III M.D   On: 07/09/2016 15:40    EKG: Independently reviewed. Sinus rhythm with non-specific T wave inversion in lead 3  Assessment/Plan Principal Problem:   Acute post-op anemia - likely due to fracture and post op blood loss - Hb 10.9 prior to surgery and 6.6 now -  hemoccult negative - transfuse 2 U PRBC, check anemia panel  - hold Lovenox for today until it is determined that her Hb is stable  Active Problems: Increased confusion with baseline Dementia - ? Due to Dilaudid which she was discharged with for pain- follow - she was noted to have muscle fasciculations and severe spasms of her foot in the ER and was given 0.5 mg of IV Dilaudid which was sedated her - can try use Motrin for pain and Robaxin for muscle spasms and limit use of Dilaudid    DM type 2 (diabetes mellitus, type 2) ([place]) - uncontrolled - resume Levemir and SSI - carb mod diet    ANXIETY & DEPRESSION -cont Xanax and Cymbalta    Essential hypertension - she takes: Norvasc, Cozaar, Toprol- resume with holding parameters    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 - s/p ORIF  - will return to SNF for PT - have notified Dr Alusio's group that she has  been re-hospitalized.   DVT prophylaxis: SCDs Code Status: DNR Family Communication: daughter [name] who is POA Disposition Plan: med/surg  Consults called:  none Admission status: observation for blood transfusion    Debbe Odea MD [facility] Pager: [email or web address] Password [email or web address] 7PM-7AM, please contact night-coverage   07/09/2016, 5:17 PM

## 2016-07-09 NOTE — ED Notes (Signed)
Pt has visible spasms in her surgical leg which are painful.  EDP notified that daughter states that there is medication for this that she needs.  Ortho tech notified for application of knee immobilizer

## 2016-07-10 DIAGNOSIS — D62 Acute posthemorrhagic anemia: Principal | ICD-10-CM

## 2016-07-10 LAB — CBC WITH DIFFERENTIAL/PLATELET
BASOS ABS: 0 10*3/uL (ref 0.0–0.1)
Basophils Relative: 0 %
EOS PCT: 0 %
Eosinophils Absolute: 0 10*3/uL (ref 0.0–0.7)
HEMATOCRIT: 26.6 % — AB (ref 36.0–46.0)
Hemoglobin: 9 g/dL — ABNORMAL LOW (ref 12.0–15.0)
LYMPHS PCT: 16 %
Lymphs Abs: 1.4 10*3/uL (ref 0.7–4.0)
MCH: 27.7 pg (ref 26.0–34.0)
MCHC: 33.8 g/dL (ref 30.0–36.0)
MCV: 81.8 fL (ref 78.0–100.0)
MONOS PCT: 4 %
Monocytes Absolute: 0.4 10*3/uL (ref 0.1–1.0)
Neutro Abs: 7 10*3/uL (ref 1.7–7.7)
Neutrophils Relative %: 80 %
Platelets: 232 10*3/uL (ref 150–400)
RBC: 3.25 MIL/uL — AB (ref 3.87–5.11)
RDW: 14.6 % (ref 11.5–15.5)
WBC: 8.8 10*3/uL (ref 4.0–10.5)

## 2016-07-10 LAB — IRON AND TIBC
Iron: 18 ug/dL — ABNORMAL LOW (ref 28–170)
SATURATION RATIOS: 8 % — AB (ref 10.4–31.8)
TIBC: 218 ug/dL — AB (ref 250–450)
UIBC: 200 ug/dL

## 2016-07-10 LAB — VITAMIN B12: VITAMIN B 12: 1185 pg/mL — AB (ref 180–914)

## 2016-07-10 LAB — GLUCOSE, CAPILLARY
GLUCOSE-CAPILLARY: 218 mg/dL — AB (ref 65–99)
Glucose-Capillary: 216 mg/dL — ABNORMAL HIGH (ref 65–99)

## 2016-07-10 LAB — FERRITIN: FERRITIN: 46 ng/mL (ref 11–307)

## 2016-07-10 LAB — FOLATE: Folate: 8.2 ng/mL (ref >5.9)

## 2016-07-10 MED ORDER — FERROUS SULFATE 325 (65 FE) MG PO TABS: 325.0000 mg | ORAL_TABLET | ORAL | 3 refills | Status: DC

## 2016-07-10 MED ORDER — HYDROMORPHONE HCL 2 MG PO TABS: 2.0000 mg | ORAL_TABLET | ORAL | 0 refills | Status: DC | PRN

## 2016-07-10 MED ORDER — TRAMADOL HCL 50 MG PO TABS
50.0000 mg | ORAL_TABLET | Freq: Four times a day (QID) | ORAL | Status: DC | PRN
  Administered 2016-07-10: 50 mg via ORAL
  Filled 2016-07-10: qty 1

## 2016-07-10 MED ORDER — ALPRAZOLAM 0.25 MG PO TABS: 0.2500 mg | ORAL_TABLET | Freq: Two times a day (BID) | ORAL | 0 refills | Status: DC | PRN

## 2016-07-10 MED ORDER — INSULIN DETEMIR 100 UNIT/ML ~~LOC~~ SOLN: 10.0000 [IU] | Freq: Two times a day (BID) | SUBCUTANEOUS | 11 refills | Status: DC

## 2016-07-10 NOTE — Discharge Summary (Signed)
Discharge Summary  [name] TGY:[id number] DOB: 08-20-1940  PCP: [name], MD  Admit date: 07/09/2016 Discharge date: 07/10/2016  Time spent: >49mins  Recommendations for Outpatient Follow-up:  1. F/u with SNF MD  for hospital discharge follow up, repeat cbc/bmp at follow up, snf MD to continue adjust insulin for optimal blood sugar control. 2. F/u with orthopedics  Discharge Diagnoses:  Active Hospital Problems   Diagnosis Date Noted  . Acute post-hemorrhagic anemia 07/09/2016  . Symptomatic anemia 07/09/2016  . 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 DM type 2 (diabetes mellitus, type 2) ([place]) 07/21/2014  . ANXIETY DEPRESSION 05/15/2008  . Essential hypertension 05/15/2008    Resolved Hospital Problems   Diagnosis Date Noted Date Resolved  No resolved problems to display.    Discharge Condition: stable  Diet recommendation: heart healthy/carb modified (patient has prior h/o dysphagia two yrs ago, per daughter that has  Resolved)  Filed Weights   07/09/16 1437 07/09/16 1901  Weight: 104.3 kg (230 lb) 102 kg (224 lb 13.9 oz)    History of present illness:  PCP: [name], MD  Patient coming from: SNF  Chief Complaint: sent for anemia  HPI: [name] is a 76 y.o. female with medical history of DM 2, HTN, dementia who fell out of her wheelchair this past week and sustained a right distal femoral periprosthetic fracture which was repaired on Wed. She was discharged home yesterday. She is sent back to the hospital for a Hb of 7.3. This was checked due to a complaint of shortness of breath. In the ER the Hb is found to be 6.6. The patient is not complaining of dyspnea to me. Her daughter gives the history and states that her mother has dementia, is wheelchair bound and has had some increase in confusion since this knee surgery. She states that Hydrocodone and Oxycodone and Morphine cause her to  hallucinate.   ED Course: CBG 361, temp 99.1, CXR unrevealing.  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   Increased confusion with baseline Dementia - ? Due to Dilaudid which she was discharged with for pain- follow - she was noted to have muscle fasciculations and severe spasms of her foot in the ER and was given 0.5 mg of IV Dilaudid which was sedated her - limit narcotic use ( daughter also reported urinary retention which has resolved and constipation for which stool softener started)    insulin dependent DM type 2 (diabetes mellitus, type 2) ([place]) - uncontrolled - she is discharged on Levemir 10units BID and SSI - carb mod diet, snf md to continue adjust insuline dose for optimal blood sugar control    ANXIETY & DEPRESSION -cont Xanax and Cymbalta    Essential hypertension - she takes: Norvasc, Cozaar, Toprol- resume with holding parameters    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 -patient is to follow with ortho   DVT prophylaxis: SCDs Code Status: DNR Family Communication: daughter [name] who is POA Disposition Plan: return to snf Consults called:  orthopedic  Procedures:  prbc transfusion   Discharge Exam: BP (!) 144/67 (BP Location: Left Arm)    Pulse 68   Temp 98.6 F (37 C) (Oral)   Resp 16   Ht 5\' 7"  (1.702 m)   Wt 102 kg (224 lb 13.9 oz)   SpO2 98%   BMI 35.22 kg/m   General: demented Cardiovascular: RRR Respiratory: CTABL  Discharge Instructions You were cared for by a hospitalist during your hospital stay. If you have any questions about your discharge medications or the care you received while you were in the hospital after you are discharged, you can call the unit and asked to speak with the hospitalist on call if the hospitalist that took care of you is not available. Once you are discharged, your primary care physician will handle any further medical issues. Please note that NO REFILLS for any discharge medications will be authorized once you are discharged, as it is imperative that you return to your primary care physician (or establish a relationship with a primary care physician if you do not have one) for your aftercare needs so that they can reassess your need for medications and monitor your lab values.  Discharge Instructions    Diet - low sodium heart healthy    Complete by:  As directed    Carb modified   Increase activity slowly    Complete by:  As directed      Allergies as of 07/10/2016      Reactions   Morphine And Related Other (See Comments)   hallucination   Oxycodone Other (See Comments)   hallucination    Sulfonamide Derivatives Swelling      Medication List    STOP taking these medications   potassium chloride SA 20 MEQ tablet Commonly known as:  K-DUR,KLOR-CON   RESOURCE THICKENUP CLEAR Powd     TAKE these medications   acetaminophen 500 MG tablet Commonly known as:  TYLENOL Take 500 mg by mouth every 8 (eight) hours as needed for mild pain. What changed:  Another medication with the same name was removed. Continue taking this medication, and follow the directions you see here.   ALPRAZolam 0.25 MG tablet Commonly known as:  XANAX Take 0.25 mg by mouth 2 (two) times daily as  needed for anxiety.   amLODipine 5 MG tablet Commonly known as:  NORVASC Take 1 tablet (5 mg total) by mouth daily.   AZO CRANBERRY 250-30 MG Tabs Take 1 tablet by mouth daily.   bisacodyl 10 MG suppository Commonly known as:  DULCOLAX Place 1 suppository (10 mg total) rectally daily as needed for moderate constipation.   docusate sodium 100 MG capsule Commonly known as:  COLACE Take 100 mg by mouth 2 (two) times daily.   DULoxetine 30 MG capsule Commonly known as:  CYMBALTA Take 30 mg by mouth 2 (two) times daily.   enoxaparin 40 MG/0.4ML injection Commonly known as:  LOVENOX Inject 0.4 mLs (40 mg total) into the skin daily. Lovenox 40 mg sub-Q injection daily for a total of two weeks, then discontinue the Lovenox and resume the baby 81 mg Aspirin daily.   feeding supplement (ENSURE ENLIVE) Liqd Take 237 mLs by mouth 2 (two)  times daily between meals.   hydrocortisone cream 1 % Apply 1 application topically daily as needed for itching.   HYDROmorphone 2 MG tablet Commonly known as:  DILAUDID Take 1-2 tablets (2-4 mg total) by mouth every 4 (four) hours as needed for severe pain.   insulin aspart 100 UNIT/ML injection Commonly known as:  novoLOG Inject 0-9 Units into the skin 3 (three) times daily with meals. CBG < 70: implement hypoglycemia protocol CBG 70 - 120: 0 units CBG 121 - 150: 1 unit CBG 151 - 200: 2 units CBG 201 - 250: 3 units CBG 251 - 300: 5 units CBG 301 - 350: 7 units CBG 351 - 400: 9 units CBG > 400: call MD   insulin detemir 100 UNIT/ML injection Commonly known as:  LEVEMIR Inject 0.1 mLs (10 Units total) into the skin 2 (two) times daily. What changed:  how much to take  when to take this  Another medication with the same name was removed. Continue taking this medication, and follow the directions you see here.   loratadine 10 MG tablet Commonly known as:  CLARITIN Take 10 mg by mouth daily.   losartan 100 MG tablet Commonly known as:   COZAAR Take 100 mg by mouth daily.   Magnesium Oxide 500 MG Tabs Take 500 mg by mouth daily.   methocarbamol 500 MG tablet Commonly known as:  ROBAXIN Take 1 tablet (500 mg total) by mouth every 6 (six) hours as needed for muscle spasms.   metoprolol succinate 50 MG 24 hr tablet Commonly known as:  TOPROL-XL Take 1 tablet (50 mg total) by mouth daily.   omeprazole 40 MG capsule Commonly known as:  PRILOSEC Take 40 mg by mouth daily.   polyethylene glycol packet Commonly known as:  MIRALAX / GLYCOLAX Take 17 g by mouth daily.   potassium bicarbonate 25 MEQ disintegrating tablet Commonly known as:  K-LYTE Take 25 mEq by mouth daily.   pramipexole 0.25 MG tablet Commonly known as:  MIRAPEX Take 1.5 tablets by mouth three times daily   sodium phosphate 7-19 GM/118ML Enem Place 133 mLs (1 enema total) rectally once as needed for severe constipation.      Allergies  Allergen Reactions  . Morphine And Related Other (See Comments)    hallucination   . Oxycodone Other (See Comments)    hallucination   . Sulfonamide Derivatives Swelling   Follow-up Information    Gaynelle Arabian, MD Follow up in 1 week(s).   Specialty:  Orthopedic Surgery Why:  s/p right knee surgery Contact information: [address] [place] [address] [phone]            The results of significant diagnostics from this hospitalization (including imaging, microbiology, ancillary and laboratory) are listed below for reference.    Significant Diagnostic Studies: Dg Chest 1 View  Result Date: 07/04/2016 CLINICAL DATA:  Fall with leg injury EXAM: CHEST 1 VIEW COMPARISON:  07/20/2014 FINDINGS: Borderline to mild cardiomegaly without overt failure. No consolidation or pleural effusion. No pneumothorax. IMPRESSION: Low lung volumes with borderline cardiomegaly. No edema or infiltrate. Electronically Signed   By: Donavan Foil M.D.   On: 07/04/2016 19:14   Dg Chest 2  View  Result Date: 07/09/2016 CLINICAL DATA:  Shortness of breath. EXAM: CHEST  2 VIEW COMPARISON:  Jul 04, 2016 FINDINGS: Mild cardiomegaly. The hila and mediastinum are unchanged. No pulmonary nodules, masses, or focal infiltrates. No acute abnormalities are identified. IMPRESSION: No active cardiopulmonary disease.  Electronically Signed   By: Dorise Bullion III M.D   On: 07/09/2016 15:40   Dg Knee Complete 4 Views Right  Result Date: 07/04/2016 CLINICAL DATA:  Injury, fall EXAM: RIGHT KNEE - COMPLETE 4+ VIEW COMPARISON:  11/09/2013 FINDINGS: The patient is status post right knee replacement. Mildly comminuted and impacted fracture of the distal femoral shaft and metaphysis, just proximal to the prosthetic. Approximately 1/4 shaft diameter of posterior and lateral displacement of distal fracture fragment with mild posterior angulation of the distal fragment as well. IMPRESSION: Acute, mildly displaced and angulated distal femoral fracture, adjacent to the femoral prosthetic. Electronically Signed   By: Donavan Foil M.D.   On: 07/04/2016 19:13   Dg C-arm 1-60 Min-no Report  Result Date: 07/06/2016 Fluoroscopy was utilized by the requesting physician.  No radiographic interpretation.   Dg Femur, Min 2 Views Right  Result Date: 07/06/2016 CLINICAL DATA:  ORIF distal right femur fracture. EXAM: RIGHT FEMUR 2 VIEWS COMPARISON:  07/04/2016 FINDINGS: Three intraoperative spot views of the right femur are submitted postoperatively for interpretation. Interval placement of an intramedullary rod and proximal and transfixing screws within the right femur noted, traversing a distal femur fracture, in near-anatomic alignment and position. Right total knee arthroplasty changes again noted. IMPRESSION: Internal fixation of distal femur fracture, in near-anatomic alignment and position. Electronically Signed   By: Margarette Canada M.D.   On: 07/06/2016 18:52   Dg Femur Min 2 Views Right  Result Date:  07/04/2016 CLINICAL DATA:  Fall at nursing home today deformity to the right leg EXAM: RIGHT FEMUR 2 VIEWS COMPARISON:  None. FINDINGS: Right femoral head projects in joint. Right pubic rami appear intact. No fracture or dislocation of the proximal right femur. Comminuted distal femoral shaft fracture just above the right femoral prosthetic. There is mild posterior angulation of distal fracture fragment. IMPRESSION: Comminuted and mildly angulated fracture involving the distal shaft and metaphysis of the femur, just proximal to the femoral prosthetic. Electronically Signed   By: Donavan Foil M.D.   On: 07/04/2016 19:11    Microbiology: Recent Results (from the past 240 hour(s))  MRSA PCR Screening     Status: None   Collection Time: 07/05/16  2:11 AM  Result Value Ref Range Status   MRSA by PCR NEGATIVE NEGATIVE Final    Comment:        The GeneXpert MRSA Assay (FDA approved for NASAL specimens only), is one component of a comprehensive MRSA colonization surveillance program. It is not intended to diagnose MRSA infection nor to guide or monitor treatment for MRSA infections.   Surgical pcr screen     Status: Abnormal   Collection Time: 07/06/16  3:06 PM  Result Value Ref Range Status   MRSA, PCR NEGATIVE NEGATIVE Final   Staphylococcus aureus POSITIVE (A) NEGATIVE Final    Comment:        The Xpert SA Assay (FDA approved for NASAL specimens in patients over 57 years of age), is one component of a comprehensive surveillance program.  Test performance has been validated by [facility] for patients greater than or equal to 70 year old. It is not intended to diagnose infection nor to guide or monitor treatment.      Labs: Basic Metabolic Panel:  Recent Labs Lab 07/04/16 1828 07/05/16 0630 07/06/16 0645 07/09/16 1555  NA 140 141 141 137  K 3.7 3.7 4.0 3.8  CL 104 105 106 105  CO2 27 25 24 25   GLUCOSE 285* 373* 329*  361*  BUN 19 21* 22* 21*  CREATININE 0.80 0.91 0.98  0.87  CALCIUM 9.3 9.4 9.2 8.3*   Liver Function Tests:  Recent Labs Lab 07/05/16 0630  AST 13*  ALT 15  ALKPHOS 84  BILITOT 0.8  PROT 7.2  ALBUMIN 3.9   No results for input(s): LIPASE, AMYLASE in the last 168 hours. No results for input(s): AMMONIA in the last 168 hours. CBC:  Recent Labs Lab 07/04/16 1828 07/05/16 0630 07/06/16 0645 07/09/16 1555 07/10/16 0920  WBC 14.7* 12.2* 13.1* 7.9 8.8  NEUTROABS 12.9*  --   --  5.9 7.0  HGB 12.8 11.8* 10.9* 6.6* 9.0*  HCT 38.5 37.0 33.8* 19.9* 26.6*  MCV 79.5 80.1 81.1 81.2 81.8  PLT 267 277 256 212 232   Cardiac Enzymes: No results for input(s): CKTOTAL, CKMB, CKMBINDEX, TROPONINI in the last 168 hours. BNP: BNP (last 3 results) No results for input(s): BNP in the last 8760 hours.  ProBNP (last 3 results) No results for input(s): PROBNP in the last 8760 hours.  CBG:  Recent Labs Lab 07/07/16 2103 07/08/16 0726 07/09/16 2146 07/10/16 0725 07/10/16 1135  GLUCAP 340* 337* 330* 218* 216*       SignedFlorencia Reasons MD, PhD  [facility] 07/10/2016, 1:49 PM

## 2016-07-10 NOTE — Consult Note (Signed)
ORTHOPAEDIC CONSULTATION  REQUESTING PHYSICIAN: Florencia Reasons, MD  PCP:  [name], MD  Chief Complaint: Evaluate right lower extremity  HPI: [name] is a 76 y.o. female with a history of diabetes and dementia who underwent intramedullary fixation of a right periprosthetic distal femur fracture by Dr. Wynelle Link on 07/06/2016. She was recently discharged to a skilled nursing facility. She was brought back to the hospital for decrease in hemoglobin, worsening of her dementia, and shortness of breath. The patient was admitted to the hospitalist service. Her hemoglobin was found to be 6.6, and packed red blood cells have been ordered. Orthopedic consultation was placed for evaluation of her operative extremity.  Past Medical History:  Diagnosis Date  . Anxiety   . Depression   . Diabetes mellitus   . Dyspnea   . GERD (gastroesophageal reflux disease)   . Headache(784.0)   . HTN (hypertension)    dr Johnsie Cancel  . Hypercholesteremia    DENIES  . Osteoarthritis   . PONV (postoperative nausea and vomiting)   . RLS (restless legs syndrome)   . Stress    Past Surgical History:  Procedure Laterality Date  . ABDOMINAL HYSTERECTOMY    . COLONOSCOPY    . left forearm fracture with ORIF Left   . LUMBAR LAMINECTOMY/DECOMPRESSION MICRODISCECTOMY N/A 04/18/2012   Procedure: LUMBAR LAMINECTOMY/DECOMPRESSION MICRODISCECTOMY 3 LEVELS;  Surgeon: Winfield Cunas, MD;  Location: [facility];  Service: Neurosurgery;  Laterality: N/A;  Lumbar three-four, lumbar four-five, lumbar five-sacral one decompression. Synovial cyst resection lumbar four-five  . ORIF PERIPROSTHETIC FRACTURE Right 07/06/2016   Procedure: OPEN REDUCTION INTERNAL FIXATION (ORIF) PERIPROSTHETIC FRACTURE;  Surgeon: Gaynelle Arabian, MD;  Location: [facility];  Service: Orthopedics;  Laterality: Right;  . REPLACEMENT TOTAL KNEE Right   . WISDOM TOOTH EXTRACTION     Social History   Social History  . Marital status: Married   Spouse name: Iona Beard  . Number of children: 1  . Years of education: college   Occupational History  .      retired   Social History Main Topics  . Smoking status: Never Smoker  . Smokeless tobacco: Never Used  . Alcohol use No  . Drug use: No  . Sexual activity: Not Currently   Other Topics Concern  . None   Social History Narrative   Patient lives at home with her husband Iona Beard).   Education- college    Right handed.   Caffeine- soda half cup daily.   Retired.   Family History  Problem Relation Age of Onset  . Diabetes Father   . High blood pressure Father   . High blood pressure Mother   . Colon cancer Neg Hx   . Esophageal cancer Neg Hx   . Liver disease Neg Hx   . Stomach cancer Neg Hx   . Pancreatic cancer Neg Hx    Allergies  Allergen Reactions  . Morphine And Related Other (See Comments)    hallucination   . Oxycodone Other (See Comments)    hallucination   . Sulfonamide Derivatives Swelling   Prior to Admission medications   Medication Sig Start Date End Date Taking? Authorizing Provider  amLODipine (NORVASC) 5 MG tablet Take 1 tablet (5 mg total) by mouth daily. 07/14/14  Yes Ghimire, Henreitta Leber, MD  Cranberry-Vitamin C-Probiotic (AZO CRANBERRY) 250-30 MG TABS Take 1 tablet by mouth daily.   Yes [provider]  docusate sodium (COLACE) 100 MG capsule Take 100 mg by mouth 2 (  two) times daily.   Yes [provider]  DULoxetine (CYMBALTA) 30 MG capsule Take 30 mg by mouth 2 (two) times daily. 06/13/16  Yes [provider]  enoxaparin (LOVENOX) 40 MG/0.4ML injection Inject 0.4 mLs (40 mg total) into the skin daily. Lovenox 40 mg sub-Q injection daily for a total of two weeks, then discontinue the Lovenox and resume the baby 81 mg Aspirin daily. 07/09/16  Yes Perkins, Alexzandrew L, PA-C  feeding supplement, ENSURE ENLIVE, (ENSURE ENLIVE) LIQD Take 237 mLs by mouth 2 (two) times daily between meals. 07/08/16  Yes Perkins, Alexzandrew L,  PA-C  HYDROmorphone (DILAUDID) 2 MG tablet Take 1-2 tablets (2-4 mg total) by mouth every 4 (four) hours as needed for severe pain. 07/08/16  Yes Perkins, Alexzandrew L, PA-C  Insulin Detemir (LEVEMIR FLEXTOUCH) 100 UNIT/ML Pen Inject 5-12 Units into the skin 2 (two) times daily. Inject 12 units in the morning and Inject 5 units at bedtime   Yes [provider]  loratadine (CLARITIN) 10 MG tablet Take 10 mg by mouth daily.    Yes [provider]  losartan (COZAAR) 100 MG tablet Take 100 mg by mouth daily.   Yes [provider]  Magnesium Oxide 500 MG TABS Take 500 mg by mouth daily.    Yes [provider]  methocarbamol (ROBAXIN) 500 MG tablet Take 1 tablet (500 mg total) by mouth every 6 (six) hours as needed for muscle spasms. 07/08/16  Yes Perkins, Alexzandrew L, PA-C  metoprolol succinate (TOPROL-XL) 50 MG 24 hr tablet Take 1 tablet (50 mg total) by mouth daily. 07/04/14  Yes Robbie Lis, MD  omeprazole (PRILOSEC) 40 MG capsule Take 40 mg by mouth daily.   Yes [provider]  polyethylene glycol (MIRALAX / GLYCOLAX) packet Take 17 g by mouth daily. 07/26/13  Yes [name], MD  potassium bicarbonate (K-LYTE) 25 MEQ disintegrating tablet Take 25 mEq by mouth daily.   Yes [provider]  pramipexole (MIRAPEX) 0.25 MG tablet Take 1.5 tablets by mouth three times daily   Yes [provider]  acetaminophen (TYLENOL) 500 MG tablet Take 500 mg by mouth every 8 (eight) hours as needed for mild pain.    [provider]  acetaminophen (TYLENOL) 650 MG CR tablet Take 650 mg by mouth 2 (two) times daily.    [provider]  ALPRAZolam Duanne Moron) 0.25 MG tablet Take 0.25 mg by mouth 2 (two) times daily as needed for anxiety.    [provider]  bisacodyl (DULCOLAX) 10 MG suppository Place 1 suppository (10 mg total) rectally daily as needed for moderate constipation. 07/08/16   Perkins, Alexzandrew L, PA-C    hydrocortisone cream 1 % Apply 1 application topically daily as needed for itching.    [provider]  insulin aspart (NOVOLOG) 100 UNIT/ML injection Inject 0-9 Units into the skin 3 (three) times daily with meals. CBG < 70: implement hypoglycemia protocol CBG 70 - 120: 0 units CBG 121 - 150: 1 unit CBG 151 - 200: 2 units CBG 201 - 250: 3 units CBG 251 - 300: 5 units CBG 301 - 350: 7 units CBG 351 - 400: 9 units CBG > 400: call MD Patient not taking: Reported on 07/09/2016 07/14/14   Jonetta Osgood, MD  insulin detemir (LEVEMIR) 100 UNIT/ML injection Inject 0.05 mLs (5 Units total) into the skin at bedtime. Patient not taking: Reported on 07/09/2016 07/14/14   Jonetta Osgood, MD  Maltodextrin-Xanthan Gum ([place]  CLEAR) POWD Take 120 g by mouth as needed. Patient not taking: Reported on 08/10/2015 07/22/14   Robbie Lis, MD  potassium chloride SA (K-DUR,KLOR-CON) 20 MEQ tablet Take 1 tablet (20 mEq total) by mouth daily. Patient not taking: Reported on 07/09/2016 07/22/14   Robbie Lis, MD  sodium phosphate (FLEET) 7-19 GM/118ML ENEM Place 133 mLs (1 enema total) rectally once as needed for severe constipation. Patient not taking: Reported on 07/09/2016 07/08/16   Joelene Millin, PA-C   Dg Chest 2 View  Result Date: 07/09/2016 CLINICAL DATA:  Shortness of breath. EXAM: CHEST  2 VIEW COMPARISON:  Jul 04, 2016 FINDINGS: Mild cardiomegaly. The hila and mediastinum are unchanged. No pulmonary nodules, masses, or focal infiltrates. No acute abnormalities are identified. IMPRESSION: No active cardiopulmonary disease. Electronically Signed   By: Dorise Bullion III M.D   On: 07/09/2016 15:40    Positive ROS: All other systems have been reviewed and were otherwise negative with the exception of those mentioned in the HPI and as above.  Physical Exam: General: Alert, no acute distress Cardiovascular: No pedal edema Respiratory: No cyanosis, no use of accessory  musculature GI: No organomegaly, abdomen is soft and non-tender Skin: No lesions in the area of chief complaint Neurologic: Sensation intact distally Psychiatric: Patient is competent for consent with normal mood and affect Lymphatic: No axillary or cervical lymphadenopathy  MUSCULOSKELETAL: Examination of the right lower extremity reveals an intact knee immobilizer. The immobilizer was removed. Her incision is clean dry and intact. She does have an appropriate amount of swelling to the lower extremity. No calf tenderness to palpation. She is neurovascularly intact. Her compartments are soft and compressible.  Assessment: Status post intramedullary fixation of right periprosthetic distal femur fracture Dementia Acute blood loss anemia  Plan: I discussed the findings with the patient and her family. Her operative extremity appears benign. I recommend continued workup and treatment of her medical issues by the hospitalist team. Continue nonweightbearing right lower extremity. Continue knee immobilizer. Minimize the use of narcotics. She should follow-up with Dr. Wynelle Link week after discharge.    Akiel Fennell, Horald Pollen, MD Cell [phone]    07/10/2016 8:08 AM

## 2016-07-10 NOTE — Progress Notes (Signed)
Patient is set to discharge to [facility] SNF today. Patient & family aware. Discharge packet given to RN, Altha Harm . [facility] called for transport.   Kingsley Spittle, [place] Clinical Social Worker [phone]

## 2016-07-11 DIAGNOSIS — E119 Type 2 diabetes mellitus without complications: Secondary | ICD-10-CM | POA: Diagnosis not present

## 2016-07-11 DIAGNOSIS — I10 Essential (primary) hypertension: Secondary | ICD-10-CM | POA: Diagnosis not present

## 2016-07-11 DIAGNOSIS — D62 Acute posthemorrhagic anemia: Secondary | ICD-10-CM | POA: Diagnosis not present

## 2016-07-11 DIAGNOSIS — M9711XD 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, subsequent encounter: Secondary | ICD-10-CM | POA: Diagnosis not present

## 2016-07-11 LAB — BPAM RBC
BLOOD PRODUCT EXPIRATION DATE: 201806072359
Blood Product Expiration Date: 201806072359
ISSUE DATE / TIME: 201805200022
ISSUE DATE / TIME: 201805192101
UNIT TYPE AND RH: 5100
Unit Type and Rh: 5100

## 2016-07-11 LAB — TYPE AND SCREEN
ABO/RH(D): O POS
ANTIBODY SCREEN: NEGATIVE
UNIT DIVISION: 0
Unit division: 0

## 2016-07-13 DIAGNOSIS — D62 Acute posthemorrhagic anemia: Secondary | ICD-10-CM | POA: Diagnosis not present

## 2016-07-13 DIAGNOSIS — I10 Essential (primary) hypertension: Secondary | ICD-10-CM | POA: Diagnosis not present

## 2016-07-13 DIAGNOSIS — M9711XD 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, subsequent encounter: Secondary | ICD-10-CM | POA: Diagnosis not present

## 2016-07-13 DIAGNOSIS — S72141S Displaced intertrochanteric fracture of right femur, sequela: Secondary | ICD-10-CM | POA: Diagnosis not present

## 2016-07-13 DIAGNOSIS — F418 Other specified anxiety disorders: Secondary | ICD-10-CM | POA: Diagnosis not present

## 2016-07-13 DIAGNOSIS — E119 Type 2 diabetes mellitus without complications: Secondary | ICD-10-CM | POA: Diagnosis not present

## 2016-07-13 DIAGNOSIS — E7800 Pure hypercholesterolemia, unspecified: Secondary | ICD-10-CM | POA: Diagnosis not present

## 2016-07-19 DIAGNOSIS — S72401D Unspecified fracture of lower end of right femur, subsequent encounter for closed fracture with routine healing: Secondary | ICD-10-CM | POA: Diagnosis not present

## 2016-07-21 DIAGNOSIS — I10 Essential (primary) hypertension: Secondary | ICD-10-CM | POA: Diagnosis not present

## 2016-07-21 DIAGNOSIS — M9711XD 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, subsequent encounter: Secondary | ICD-10-CM | POA: Diagnosis not present

## 2016-07-21 DIAGNOSIS — E119 Type 2 diabetes mellitus without complications: Secondary | ICD-10-CM | POA: Diagnosis not present

## 2016-07-21 DIAGNOSIS — D62 Acute posthemorrhagic anemia: Secondary | ICD-10-CM | POA: Diagnosis not present

## 2016-07-29 NOTE — Addendum Note (Signed)
Addendum  created 07/29/16 1242 by Lyn Hollingshead, MD   Sign clinical note

## 2016-08-09 DIAGNOSIS — R634 Abnormal weight loss: Secondary | ICD-10-CM | POA: Diagnosis not present

## 2016-08-09 DIAGNOSIS — R400 Somnolence: Secondary | ICD-10-CM | POA: Diagnosis not present

## 2016-08-09 DIAGNOSIS — F0390 Unspecified dementia without behavioral disturbance: Secondary | ICD-10-CM | POA: Diagnosis not present

## 2016-08-09 DIAGNOSIS — E119 Type 2 diabetes mellitus without complications: Secondary | ICD-10-CM | POA: Diagnosis not present

## 2016-08-10 ENCOUNTER — Encounter (HOSPITAL_COMMUNITY): Payer: Self-pay

## 2016-08-10 ENCOUNTER — Inpatient Hospital Stay (HOSPITAL_COMMUNITY)
Admission: EM | Admit: 2016-08-10 | Discharge: 2016-08-13 | DRG: 871 | Disposition: A | Discharge status: Skilled Nursing Facility | Payer: Medicare Other | Attending: Internal Medicine | Admitting: Internal Medicine

## 2016-08-10 ENCOUNTER — Emergency Department (HOSPITAL_COMMUNITY): Payer: Medicare Other

## 2016-08-10 DIAGNOSIS — G2581 Restless legs syndrome: Secondary | ICD-10-CM | POA: Diagnosis present

## 2016-08-10 DIAGNOSIS — F419 Anxiety disorder, unspecified: Secondary | ICD-10-CM | POA: Diagnosis present

## 2016-08-10 DIAGNOSIS — E876 Hypokalemia: Secondary | ICD-10-CM | POA: Diagnosis not present

## 2016-08-10 DIAGNOSIS — R6520 Severe sepsis without septic shock: Secondary | ICD-10-CM | POA: Diagnosis present

## 2016-08-10 DIAGNOSIS — R410 Disorientation, unspecified: Secondary | ICD-10-CM | POA: Diagnosis not present

## 2016-08-10 DIAGNOSIS — K5909 Other constipation: Secondary | ICD-10-CM

## 2016-08-10 DIAGNOSIS — I129 Hypertensive chronic kidney disease with stage 1 through stage 4 chronic kidney disease, or unspecified chronic kidney disease: Secondary | ICD-10-CM | POA: Diagnosis present

## 2016-08-10 DIAGNOSIS — K219 Gastro-esophageal reflux disease without esophagitis: Secondary | ICD-10-CM | POA: Diagnosis present

## 2016-08-10 DIAGNOSIS — I10 Essential (primary) hypertension: Secondary | ICD-10-CM | POA: Diagnosis present

## 2016-08-10 DIAGNOSIS — G9340 Encephalopathy, unspecified: Secondary | ICD-10-CM | POA: Diagnosis not present

## 2016-08-10 DIAGNOSIS — E871 Hypo-osmolality and hyponatremia: Secondary | ICD-10-CM | POA: Diagnosis not present

## 2016-08-10 DIAGNOSIS — R197 Diarrhea, unspecified: Secondary | ICD-10-CM | POA: Diagnosis not present

## 2016-08-10 DIAGNOSIS — R1110 Vomiting, unspecified: Secondary | ICD-10-CM | POA: Diagnosis not present

## 2016-08-10 DIAGNOSIS — R4182 Altered mental status, unspecified: Secondary | ICD-10-CM | POA: Diagnosis not present

## 2016-08-10 DIAGNOSIS — A419 Sepsis, unspecified organism: Secondary | ICD-10-CM | POA: Diagnosis not present

## 2016-08-10 DIAGNOSIS — E1165 Type 2 diabetes mellitus with hyperglycemia: Secondary | ICD-10-CM | POA: Diagnosis present

## 2016-08-10 DIAGNOSIS — Z79899 Other long term (current) drug therapy: Secondary | ICD-10-CM

## 2016-08-10 DIAGNOSIS — Z96651 Presence of right artificial knee joint: Secondary | ICD-10-CM | POA: Diagnosis present

## 2016-08-10 DIAGNOSIS — R918 Other nonspecific abnormal finding of lung field: Secondary | ICD-10-CM | POA: Diagnosis not present

## 2016-08-10 DIAGNOSIS — E860 Dehydration: Secondary | ICD-10-CM | POA: Diagnosis present

## 2016-08-10 DIAGNOSIS — E870 Hyperosmolality and hypernatremia: Secondary | ICD-10-CM | POA: Diagnosis not present

## 2016-08-10 DIAGNOSIS — F0390 Unspecified dementia without behavioral disturbance: Secondary | ICD-10-CM | POA: Diagnosis present

## 2016-08-10 DIAGNOSIS — N3080 Other cystitis without hematuria: Secondary | ICD-10-CM | POA: Diagnosis present

## 2016-08-10 DIAGNOSIS — E119 Type 2 diabetes mellitus without complications: Secondary | ICD-10-CM

## 2016-08-10 DIAGNOSIS — E46 Unspecified protein-calorie malnutrition: Secondary | ICD-10-CM | POA: Diagnosis present

## 2016-08-10 DIAGNOSIS — J9811 Atelectasis: Secondary | ICD-10-CM | POA: Diagnosis present

## 2016-08-10 DIAGNOSIS — E872 Acidosis: Secondary | ICD-10-CM | POA: Diagnosis not present

## 2016-08-10 DIAGNOSIS — E1122 Type 2 diabetes mellitus with diabetic chronic kidney disease: Secondary | ICD-10-CM | POA: Diagnosis present

## 2016-08-10 DIAGNOSIS — N179 Acute kidney failure, unspecified: Secondary | ICD-10-CM | POA: Diagnosis present

## 2016-08-10 DIAGNOSIS — Z993 Dependence on wheelchair: Secondary | ICD-10-CM

## 2016-08-10 DIAGNOSIS — K5641 Fecal impaction: Secondary | ICD-10-CM | POA: Diagnosis present

## 2016-08-10 DIAGNOSIS — Z7982 Long term (current) use of aspirin: Secondary | ICD-10-CM

## 2016-08-10 DIAGNOSIS — Z66 Do not resuscitate: Secondary | ICD-10-CM | POA: Diagnosis present

## 2016-08-10 DIAGNOSIS — N390 Urinary tract infection, site not specified: Secondary | ICD-10-CM | POA: Diagnosis present

## 2016-08-10 DIAGNOSIS — Z794 Long term (current) use of insulin: Secondary | ICD-10-CM

## 2016-08-10 DIAGNOSIS — M1990 Unspecified osteoarthritis, unspecified site: Secondary | ICD-10-CM | POA: Diagnosis present

## 2016-08-10 DIAGNOSIS — N183 Chronic kidney disease, stage 3 (moderate): Secondary | ICD-10-CM | POA: Diagnosis present

## 2016-08-10 DIAGNOSIS — R109 Unspecified abdominal pain: Secondary | ICD-10-CM | POA: Diagnosis not present

## 2016-08-10 DIAGNOSIS — F329 Major depressive disorder, single episode, unspecified: Secondary | ICD-10-CM | POA: Diagnosis present

## 2016-08-10 DIAGNOSIS — E7800 Pure hypercholesterolemia, unspecified: Secondary | ICD-10-CM | POA: Diagnosis present

## 2016-08-10 LAB — COMPREHENSIVE METABOLIC PANEL
ALBUMIN: 4 g/dL (ref 3.5–5.0)
ALK PHOS: 118 U/L (ref 38–126)
ALT: 13 U/L — AB (ref 14–54)
AST: 17 U/L (ref 15–41)
Anion gap: 12 (ref 5–15)
BUN: 31 mg/dL — AB (ref 6–20)
CALCIUM: 10.2 mg/dL (ref 8.9–10.3)
CHLORIDE: 104 mmol/L (ref 101–111)
CO2: 25 mmol/L (ref 22–32)
CREATININE: 1.14 mg/dL — AB (ref 0.44–1.00)
GFR calc Af Amer: 52 mL/min — ABNORMAL LOW (ref >60)
GFR calc non Af Amer: 45 mL/min — ABNORMAL LOW (ref >60)
GLUCOSE: 243 mg/dL — AB (ref 65–99)
Potassium: 4.1 mmol/L (ref 3.5–5.1)
SODIUM: 141 mmol/L (ref 135–145)
Total Bilirubin: 0.8 mg/dL (ref 0.3–1.2)
Total Protein: 8.2 g/dL — ABNORMAL HIGH (ref 6.5–8.1)

## 2016-08-10 LAB — CBC
HCT: 42.5 % (ref 36.0–46.0)
Hemoglobin: 14 g/dL (ref 12.0–15.0)
MCH: 27.4 pg (ref 26.0–34.0)
MCHC: 32.9 g/dL (ref 30.0–36.0)
MCV: 83.2 fL (ref 78.0–100.0)
PLATELETS: 362 10*3/uL (ref 150–400)
RBC: 5.11 MIL/uL (ref 3.87–5.11)
RDW: 15.1 % (ref 11.5–15.5)
WBC: 13.1 10*3/uL — ABNORMAL HIGH (ref 4.0–10.5)

## 2016-08-10 LAB — LIPASE, BLOOD: Lipase: 24 U/L (ref 11–51)

## 2016-08-10 LAB — I-STAT CG4 LACTIC ACID, ED: LACTIC ACID, VENOUS: 2.35 mmol/L — AB (ref 0.5–1.9)

## 2016-08-10 LAB — CBG MONITORING, ED: GLUCOSE-CAPILLARY: 250 mg/dL — AB (ref 65–99)

## 2016-08-10 MED ORDER — VANCOMYCIN HCL IN DEXTROSE 1-5 GM/200ML-% IV SOLN
1000.0000 mg | Freq: Once | INTRAVENOUS | Status: AC
  Administered 2016-08-10: 1000 mg via INTRAVENOUS
  Filled 2016-08-10: qty 200

## 2016-08-10 MED ORDER — IOPAMIDOL (ISOVUE-300) INJECTION 61%
INTRAVENOUS | Status: AC
  Filled 2016-08-10: qty 100

## 2016-08-10 MED ORDER — IOPAMIDOL (ISOVUE-300) INJECTION 61%
100.0000 mL | Freq: Once | INTRAVENOUS | Status: AC | PRN
  Administered 2016-08-10: 100 mL via INTRAVENOUS

## 2016-08-10 MED ORDER — SODIUM CHLORIDE 0.9 % IV BOLUS (SEPSIS)
1000.0000 mL | Freq: Once | INTRAVENOUS | Status: AC
  Administered 2016-08-10: 1000 mL via INTRAVENOUS

## 2016-08-10 MED ORDER — PIPERACILLIN-TAZOBACTAM 3.375 G IVPB 30 MIN
3.3750 g | Freq: Once | INTRAVENOUS | Status: AC
  Administered 2016-08-10: 3.375 g via INTRAVENOUS
  Filled 2016-08-10: qty 50

## 2016-08-10 NOTE — ED Provider Notes (Signed)
[facility] DEPT Provider Note   CSN: [id number] Arrival date & time: 08/10/16  2106     History   Chief Complaint Chief Complaint  Patient presents with  . Altered Mental Status    HPI [name] is a 76 y.o. female.  HPI   76 yo F with PMHx HTN, DM here with AMS. Pt presents from nursing facility. According to facility report to EMS, pt was c/o abdominal pain yesterday, had some n/v/d. Over past 24 hours, she has been increasingly confused, altered, and intermittently responsive. She is normally AOx4. On my assessment, pt complains of abd pain only.   Level 5 caveat invoked as remainder of history, ROS, and physical exam limited due to patient's AMS.   Past Medical History:  Diagnosis Date  . Anxiety   . Depression   . Diabetes mellitus   . Dyspnea   . GERD (gastroesophageal reflux disease)   . Headache(784.0)   . HTN (hypertension)    dr Johnsie Cancel  . Hypercholesteremia    DENIES  . Osteoarthritis   . PONV (postoperative nausea and vomiting)   . RLS (restless legs syndrome)   . Stress     Patient Active Problem List   Diagnosis Date Noted  . Acute post-hemorrhagic anemia 07/09/2016  . Symptomatic anemia 07/09/2016  . 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 Abdominal pain, chronic, epigastric 08/10/2015  . Dehydration   . Leukocytosis   . Aspiration pneumonia ([place]) 07/21/2014  . DM type 2 (diabetes mellitus, type 2) ([place]) 07/21/2014  . Palliative care encounter 07/21/2014  . Dysphagia 07/09/2014  . Decubitus ulcer 07/09/2014  . Blood poisoning   . Acute respiratory failure with hypoxia ([place]) 06/30/2014  . Septic shock ([place]) 06/30/2014  . Lactic acidosis 06/30/2014  . Dementia 06/30/2014  . Acute respiratory failure with hypoxemia ([place])   . Hypernatremia   . Acute kidney injury ([place]) 05/28/2014  . Diabetes ([place]) 05/28/2014  . Fever 05/27/2014  . UTI (lower urinary tract infection) 05/27/2014  . Sepsis  ([place]) 05/27/2014  . Acute encephalopathy   . FTT (failure to thrive) in adult   . Gait instability 07/18/2013    Class: Acute  . Low back pain 07/18/2013    Class: Acute  . Nausea with vomiting 07/18/2013    Class: Acute  . Abnormality of gait 04/29/2013  . 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 Head trauma 06/03/2010  . HYPERCHOLESTEROLEMIA 05/15/2008  . ANXIETY DEPRESSION 05/15/2008  . DEPRESSION 05/15/2008  . Essential hypertension 05/15/2008  . OSTEOARTHRITIS 05/15/2008  . Edema 05/15/2008  . DYSPNEA ON EXERTION 05/15/2008  . ELECTROCARDIOGRAM, ABNORMAL 05/15/2008  . DIABETES MELLITUS, TYPE II, HX OF 05/15/2008    Past Surgical History:  Procedure Laterality Date  . ABDOMINAL HYSTERECTOMY    . COLONOSCOPY    . JOINT REPLACEMENT    . left forearm fracture with ORIF Left   . LUMBAR LAMINECTOMY/DECOMPRESSION MICRODISCECTOMY N/A 04/18/2012   Procedure: LUMBAR LAMINECTOMY/DECOMPRESSION MICRODISCECTOMY 3 LEVELS;  Surgeon: Winfield Cunas, MD;  Location: [facility];  Service: Neurosurgery;  Laterality: N/A;  Lumbar three-four, lumbar four-five, lumbar five-sacral one decompression. Synovial cyst resection lumbar four-five  . ORIF PERIPROSTHETIC FRACTURE Right 07/06/2016   Procedure: OPEN REDUCTION INTERNAL FIXATION (ORIF) PERIPROSTHETIC FRACTURE;  Surgeon: Gaynelle Arabian, MD;  Location: [facility];  Service: Orthopedics;  Laterality: Right;  . REPLACEMENT TOTAL KNEE Right   . WISDOM TOOTH EXTRACTION      OB History  No data available       Home Medications    Prior to Admission medications   Medication Sig Start Date End Date Taking? Authorizing Provider  acetaminophen (TYLENOL) 325 MG tablet Take 650 mg by mouth 2 (two) times daily.   Yes [provider]  acetaminophen (TYLENOL) 500 MG tablet Take 500 mg by mouth every 8 (eight) hours as needed for mild pain.   Yes [provider]  ALPRAZolam (XANAX) 0.25 MG tablet Take  1 tablet (0.25 mg total) by mouth 2 (two) times daily as needed for anxiety. Patient taking differently: Take 0.25 mg by mouth 2 (two) times daily.  07/10/16  Yes Florencia Reasons, MD  amLODipine (NORVASC) 5 MG tablet Take 1 tablet (5 mg total) by mouth daily. 07/14/14  Yes Ghimire, Henreitta Leber, MD  aspirin 81 MG chewable tablet Chew 81 mg by mouth daily.   Yes [provider]  bisacodyl (DULCOLAX) 10 MG suppository Place 1 suppository (10 mg total) rectally daily as needed for moderate constipation. 07/08/16  Yes Perkins, Alexzandrew L, PA-C  docusate sodium (COLACE) 100 MG capsule Take 100 mg by mouth 2 (two) times daily.   Yes [provider]  DULoxetine (CYMBALTA) 30 MG capsule Take 30 mg by mouth 2 (two) times daily.   Yes [provider]  insulin aspart (NOVOLOG) 100 UNIT/ML injection Inject 2-11 Units into the skin 3 (three) times daily before meals. Pt uses per sliding scale:    101-150:  2 units 151-200:  3 units  201-250:  5 units  251-300:  7 units  301-350:  9 units  Greater than 350:  11 units Greater than 400 or less than 60:  Call MD   Yes [provider]  Insulin Detemir (LEVEMIR FLEXTOUCH) 100 UNIT/ML Pen Inject 8-12 Units into the skin 2 (two) times daily. Pt uses 12 units in the morning and 8 units at bedtime.   Yes [provider]  loratadine (CLARITIN) 10 MG tablet Take 10 mg by mouth daily.    Yes [provider]  losartan (COZAAR) 100 MG tablet Take 100 mg by mouth every evening.    Yes [provider]  Magnesium Oxide 500 MG TABS Take 500 mg by mouth daily.    Yes [provider]  methocarbamol (ROBAXIN) 500 MG tablet Take 1 tablet (500 mg total) by mouth every 6 (six) hours as needed for muscle spasms. 07/08/16  Yes Perkins, Alexzandrew L, PA-C  metoprolol succinate (TOPROL-XL) 50 MG 24 hr tablet Take 1 tablet (50 mg total) by mouth daily. 07/04/14  Yes Robbie Lis, MD  Nutritional Supplements (NUTRITIONAL  SUPPLEMENT PLUS) LIQD Take 120 mLs by mouth 2 (two) times daily. Med Pass   Yes [provider]  omeprazole (PRILOSEC) 40 MG capsule Take 40 mg by mouth daily.   Yes [provider]  polyethylene glycol (MIRALAX / GLYCOLAX) packet Take 17 g by mouth daily. 07/26/13  Yes [name], MD  potassium bicarbonate (K-LYTE) 25 MEQ disintegrating tablet Take 25 mEq by mouth daily.   Yes [provider]  pramipexole (MIRAPEX) 0.25 MG tablet Take 1.5 mg by mouth 3 (three) times daily.    Yes [provider]  traMADol (ULTRAM) 50 MG tablet Take 50 mg by mouth every 6 (six) hours as needed for moderate pain.   Yes [provider]    Family History Family History  Problem Relation Age of Onset  . Diabetes Father   . High  blood pressure Father   . High blood pressure Mother   . Colon cancer Neg Hx   . Esophageal cancer Neg Hx   . Liver disease Neg Hx   . Stomach cancer Neg Hx   . Pancreatic cancer Neg Hx     Social History Social History  Substance Use Topics  . Smoking status: Never Smoker  . Smokeless tobacco: Never Used  . Alcohol use No     Allergies   Morphine and related; Oxycodone; and Sulfonamide derivatives   Review of Systems Review of Systems  Unable to perform ROS: Mental status change     Physical Exam Updated Vital Signs BP (!) 146/125 (BP Location: Right Arm)   Pulse 94   Temp 98.4 F (36.9 C) (Axillary)   Resp 20   SpO2 100%   Physical Exam  Constitutional: She is oriented to person, place, and time. She appears well-developed and well-nourished. No distress.  HENT:  Head: Normocephalic and atraumatic.  Eyes: Conjunctivae are normal.  Neck: Neck supple.  Cardiovascular: Normal rate, regular rhythm and normal heart sounds.  Exam reveals no friction rub.   No murmur heard. Pulmonary/Chest: Effort normal and breath sounds normal. No respiratory distress. She has no wheezes. She has no rales.  Abdominal: She  exhibits no distension.  Genitourinary:  Genitourinary Comments: Moderate volume loose stool in rectal vault. Vagina mildly erythematous externally.  Musculoskeletal: She exhibits no edema.  Neurological: She is alert and oriented to person, place, and time. She exhibits normal muscle tone.  Skin: Skin is warm. Capillary refill takes less than 2 seconds.  Psychiatric: She has a normal mood and affect.  Nursing note and vitals reviewed.    ED Treatments / Results  Labs (all labs ordered are listed, but only abnormal results are displayed) Labs Reviewed  COMPREHENSIVE METABOLIC PANEL - Abnormal; Notable for the following:       Result Value   Glucose, Bld 243 (*)    BUN 31 (*)    Creatinine, Ser 1.14 (*)    Total Protein 8.2 (*)    ALT 13 (*)    GFR calc non Af Amer 45 (*)    GFR calc Af Amer 52 (*)    All other components within normal limits  CBC - Abnormal; Notable for the following:    WBC 13.1 (*)    All other components within normal limits  BLOOD GAS, VENOUS - Abnormal; Notable for the following:    pCO2, Ven 43.0 (*)    pO2, Ven 58.5 (*)    All other components within normal limits  URINALYSIS, ROUTINE W REFLEX MICROSCOPIC - Abnormal; Notable for the following:    Color, Urine AMBER (*)    APPearance CLOUDY (*)    Specific Gravity, Urine >1.046 (*)    Glucose, UA >=500 (*)    Hgb urine dipstick SMALL (*)    Protein, ur >=300 (*)    Leukocytes, UA LARGE (*)    Bacteria, UA MANY (*)    Squamous Epithelial / LPF 6-30 (*)    All other components within normal limits  CBG MONITORING, ED - Abnormal; Notable for the following:    Glucose-Capillary 250 (*)    All other components within normal limits  I-STAT CG4 LACTIC ACID, ED - Abnormal; Notable for the following:    Lactic Acid, Venous 2.35 (*)    All other components within normal limits  CULTURE, BLOOD (ROUTINE X 2)  CULTURE, BLOOD (ROUTINE X 2)  URINE  CULTURE  LIPASE, BLOOD  I-STAT CG4 LACTIC ACID, ED     EKG  EKG Interpretation None       Radiology Ct Head Wo Contrast  Result Date: 08/10/2016 CLINICAL DATA:  76 year old female with altered mental status. EXAM: CT HEAD WITHOUT CONTRAST TECHNIQUE: Contiguous axial images were obtained from the base of the skull through the vertex without intravenous contrast. COMPARISON:  Brain MRI dated 07/11/2014 FINDINGS: Brain: There is moderate age-related atrophy and chronic microvascular ischemic changes. There is dilatation of the ventricles out of proportion with the sulci which may represent central volume loss versus normal pressure hydrocephalus. Clinical correlation is recommended. There is no acute intracranial hemorrhage. No mass effect or midline shift noted. No extra-axial fluid collection. Vascular: No hyperdense vessel or unexpected calcification. Skull: Normal. Negative for fracture or focal lesion. Sinuses/Orbits: No acute finding. Other: None. IMPRESSION: 1. No acute intracranial hemorrhage. 2. Moderate age-related atrophy and chronic microvascular ischemic changes. If symptoms persist, and there are no contraindications, MRI may provide better evaluation if clinically indicated. Electronically Signed   By: Anner Crete M.D.   On: 08/10/2016 23:50   Ct Abdomen Pelvis W Contrast  Result Date: 08/11/2016 CLINICAL DATA:  Abdominal pain, vomiting and diarrhea. EXAM: CT ABDOMEN AND PELVIS WITH CONTRAST TECHNIQUE: Multidetector CT imaging of the abdomen and pelvis was performed using the standard protocol following bolus administration of intravenous contrast. CONTRAST:  12mL ISOVUE-300 IOPAMIDOL (ISOVUE-300) INJECTION 61% COMPARISON:  CT abdomen 07/30/2015.  CTA 09/15/2010 FINDINGS: Lower chest: Mild atelectasis in both lower lobes. No pleural effusion. Hepatobiliary: No focal hepatic lesion. Mild gallbladder distention with layering hyperdensity, stones versus sludge. No evidence of pericholecystic inflammation. No biliary dilatation.  Pancreas: No ductal dilatation or inflammation. Spleen: Small enhancing splenic lesions are again seen, some which are not as well visualized on the current exam. No new focal abnormality. Adrenals/Urinary Tract: Unchanged 2 cm left adrenal nodule. The right adrenal gland is normal. No hydronephrosis or perinephric edema. Small cortical hypodensity in the upper left kidney is unchanged from prior. Subcentimeter right renal cortical hypodensities are too small to characterize. There is delayed renal excretion bilaterally urinary bladder is minimally distended with wall thickening. There is air in the bladder. Mild perivesicular soft tissue stranding. Stomach/Bowel: The rectum is distended with stool spanning 7.6 cm. There is rectal wall thickening and surrounding soft tissue edema. Diffuse colonic tortuosity. Small to moderate stool in the more proximal colon. No additional colonic wall thickening. No small bowel dilatation or inflammation. The stomach is decompressed. The appendix is not confidently visualized, no right lower quadrant or pericecal inflammation. Vascular/Lymphatic: Normal caliber abdominal aorta. No abdominal or pelvic adenopathy. Reproductive: Post hysterectomy. There is air in the vagina. No adnexal mass. Other: No ascites or free air. Laxity of the anterior abdominal wall without frank hernia. Musculoskeletal: Chronic L3 compression fracture with augmentation. Chronic prominent Schmorl's nodes within superior and inferior endplates of L1. Multilevel degenerative change throughout the lumbar spine. Intramedullary rod in the right femur is partially included. IMPRESSION: 1. Rectal distention with wall thickening and surrounding soft tissue edema suspicious for fecal impaction. Colonic tortuosity with moderate colonic stool burden, likely chronic constipation. No bowel obstruction. 2. Bladder wall thickening with mild perivesicular edema. This is likely secondary to cystitis. There is air in the  urinary bladder, which may be iatrogenic, recommend correlation for catheterization attempts. Alternatively, emphysematous cystitis is considered, however no air in the bladder wall. Colovesicular fistula is not excluded. 3. Post hysterectomy. There is air  in the vagina, raising concern for colovaginal fistula. 4. Stones or sludge in the gallbladder without CT findings of cholecystitis. 5. Hyperenhancing splenic lesions are again seen, not as well seen compared to prior exam, possibly due to phase contrast. These are stable compared to 2012 CT suggesting hemangiomas, but nonspecific. 6. Stable left adrenal adenoma. Electronically Signed   By: Jeb Levering M.D.   On: 08/11/2016 00:07   Dg Chest Portable 1 View  Result Date: 08/10/2016 CLINICAL DATA:  Initial evaluation for acute altered mental status. EXAM: PORTABLE CHEST 1 VIEW COMPARISON:  The prior radiograph from 07/09/2016 FINDINGS: Mild cardiomegaly, stable. Mediastinal silhouette within normal limits. Lungs are hypoinflated. Patchy bibasilar opacities favored to reflect atelectasis/bronchovascular crowding, although superimposed infiltrates could be considered in the correct clinical setting. No pulmonary edema or pleural effusion. No pneumothorax. No acute osseous abnormality. IMPRESSION: Shallow lung inflation with patchy bibasilar opacities, left greater than right. Atelectasis/bronchovascular crowding is favored, although superimposed infiltrates could be considered in the correct clinical setting. Electronically Signed   By: Jeannine Boga M.D.   On: 08/10/2016 23:04    Procedures .Critical Care Performed by: Duffy Bruce Authorized by: Duffy Bruce      (including critical care time)  CRITICAL CARE Performed by: Evonnie Pat   Total critical care time: 35 minutes  Critical care time was exclusive of separately billable procedures and treating other patients.  Critical care was necessary to treat or prevent  imminent or life-threatening deterioration.  Critical care was time spent personally by me on the following activities: development of treatment plan with patient and/or surrogate as well as nursing, discussions with consultants, evaluation of patient's response to treatment, examination of patient, obtaining history from patient or surrogate, ordering and performing treatments and interventions, ordering and review of laboratory studies, ordering and review of radiographic studies, pulse oximetry and re-evaluation of patient's condition.    Medications Ordered in ED Medications  iopamidol (ISOVUE-300) 61 % injection (not administered)  0.9 %  sodium chloride infusion (not administered)  iopamidol (ISOVUE-300) 61 % injection 100 mL (100 mLs Intravenous Contrast Given 08/10/16 2310)  sodium chloride 0.9 % bolus 1,000 mL (1,000 mLs Intravenous New Bag/Given 08/10/16 2340)  vancomycin (VANCOCIN) IVPB 1000 mg/200 mL premix (0 mg Intravenous Stopped 08/11/16 0105)  piperacillin-tazobactam (ZOSYN) IVPB 3.375 g (0 g Intravenous Stopped 08/11/16 0103)     Initial Impression / Assessment and Plan / ED Course  I have reviewed the triage vital signs and the nursing notes.  Pertinent labs & imaging results that were available during my care of the patient were reviewed by me and considered in my medical decision making (see chart for details).    76 yo F with PMHx as above here with AMS, decreased LOC. On exam, pt appears encephalopathic but no apparent focal neuro deficits. She has diffuse abdominal TTP. Lab work obtained and shows leukocytosis, mild AKI, and mild lactic acidosis. Pt given IVF. CT head neg. CT A/P shows likely cystitis with air in bladder, possible rectovaginal and rectovesical fistulae, and fecal impaction. Pt now producing loose stools on my exam. No palpable fistula on digital exam. Will tx for encephalopathy/sepsis 2/2 UTI, admit for bowel regimen and further work-up. IVF given and will  place on mIVF.  Final Clinical Impressions(s) / ED Diagnoses   Final diagnoses:  Sepsis, due to unspecified organism [facility])  Other constipation  Delirium    New Prescriptions New Prescriptions   No medications on file     Duffy Bruce,  MD 08/11/16 0124

## 2016-08-10 NOTE — ED Triage Notes (Addendum)
Pt brought in by EMS pt from [facility] (SNF). Pt has been lethargic, and altered mental status x 1 day. Per EMS pt not following commands or answering questions,  Some responses with grunting. Pt baseline is normally Alert and oriented x 4. Pt is non ambulatory had a Hip fx  2 weeks ago, and recent knee replacement.   Pt had some episodes of emesis on Monday and again today per facility staff, and poor appetite.   VS per EMS CBG 304,BP 139/102, RR 20, O2 sat 95% RA

## 2016-08-11 ENCOUNTER — Encounter (HOSPITAL_COMMUNITY): Payer: Self-pay | Admitting: Family Medicine

## 2016-08-11 DIAGNOSIS — E1122 Type 2 diabetes mellitus with diabetic chronic kidney disease: Secondary | ICD-10-CM | POA: Diagnosis present

## 2016-08-11 DIAGNOSIS — G309 Alzheimer's disease, unspecified: Secondary | ICD-10-CM

## 2016-08-11 DIAGNOSIS — A419 Sepsis, unspecified organism: Principal | ICD-10-CM

## 2016-08-11 DIAGNOSIS — F0280 Dementia in other diseases classified elsewhere without behavioral disturbance: Secondary | ICD-10-CM | POA: Diagnosis not present

## 2016-08-11 DIAGNOSIS — R262 Difficulty in walking, not elsewhere classified: Secondary | ICD-10-CM | POA: Diagnosis not present

## 2016-08-11 DIAGNOSIS — R339 Retention of urine, unspecified: Secondary | ICD-10-CM | POA: Diagnosis not present

## 2016-08-11 DIAGNOSIS — N3080 Other cystitis without hematuria: Secondary | ICD-10-CM | POA: Diagnosis present

## 2016-08-11 DIAGNOSIS — E872 Acidosis: Secondary | ICD-10-CM | POA: Diagnosis present

## 2016-08-11 DIAGNOSIS — G9340 Encephalopathy, unspecified: Secondary | ICD-10-CM | POA: Diagnosis not present

## 2016-08-11 DIAGNOSIS — E46 Unspecified protein-calorie malnutrition: Secondary | ICD-10-CM | POA: Diagnosis present

## 2016-08-11 DIAGNOSIS — F0390 Unspecified dementia without behavioral disturbance: Secondary | ICD-10-CM | POA: Diagnosis present

## 2016-08-11 DIAGNOSIS — E870 Hyperosmolality and hypernatremia: Secondary | ICD-10-CM | POA: Diagnosis not present

## 2016-08-11 DIAGNOSIS — Z794 Long term (current) use of insulin: Secondary | ICD-10-CM

## 2016-08-11 DIAGNOSIS — K5900 Constipation, unspecified: Secondary | ICD-10-CM | POA: Diagnosis not present

## 2016-08-11 DIAGNOSIS — F0150 Vascular dementia without behavioral disturbance: Secondary | ICD-10-CM | POA: Diagnosis not present

## 2016-08-11 DIAGNOSIS — R410 Disorientation, unspecified: Secondary | ICD-10-CM | POA: Diagnosis not present

## 2016-08-11 DIAGNOSIS — R4182 Altered mental status, unspecified: Secondary | ICD-10-CM | POA: Diagnosis not present

## 2016-08-11 DIAGNOSIS — E1100 Type 2 diabetes mellitus with hyperosmolarity without nonketotic hyperglycemic-hyperosmolar coma (NKHHC): Secondary | ICD-10-CM | POA: Diagnosis not present

## 2016-08-11 DIAGNOSIS — I10 Essential (primary) hypertension: Secondary | ICD-10-CM | POA: Diagnosis not present

## 2016-08-11 DIAGNOSIS — E871 Hypo-osmolality and hyponatremia: Secondary | ICD-10-CM | POA: Diagnosis not present

## 2016-08-11 DIAGNOSIS — I129 Hypertensive chronic kidney disease with stage 1 through stage 4 chronic kidney disease, or unspecified chronic kidney disease: Secondary | ICD-10-CM | POA: Diagnosis present

## 2016-08-11 DIAGNOSIS — N183 Chronic kidney disease, stage 3 (moderate): Secondary | ICD-10-CM | POA: Diagnosis present

## 2016-08-11 DIAGNOSIS — E559 Vitamin D deficiency, unspecified: Secondary | ICD-10-CM | POA: Diagnosis not present

## 2016-08-11 DIAGNOSIS — R1084 Generalized abdominal pain: Secondary | ICD-10-CM | POA: Diagnosis not present

## 2016-08-11 DIAGNOSIS — M1990 Unspecified osteoarthritis, unspecified site: Secondary | ICD-10-CM | POA: Diagnosis present

## 2016-08-11 DIAGNOSIS — R41841 Cognitive communication deficit: Secondary | ICD-10-CM | POA: Diagnosis not present

## 2016-08-11 DIAGNOSIS — N390 Urinary tract infection, site not specified: Secondary | ICD-10-CM | POA: Diagnosis not present

## 2016-08-11 DIAGNOSIS — R278 Other lack of coordination: Secondary | ICD-10-CM | POA: Diagnosis not present

## 2016-08-11 DIAGNOSIS — Z993 Dependence on wheelchair: Secondary | ICD-10-CM | POA: Diagnosis not present

## 2016-08-11 DIAGNOSIS — Z66 Do not resuscitate: Secondary | ICD-10-CM | POA: Diagnosis present

## 2016-08-11 DIAGNOSIS — E669 Obesity, unspecified: Secondary | ICD-10-CM | POA: Diagnosis not present

## 2016-08-11 DIAGNOSIS — K5909 Other constipation: Secondary | ICD-10-CM | POA: Diagnosis not present

## 2016-08-11 DIAGNOSIS — R6520 Severe sepsis without septic shock: Secondary | ICD-10-CM | POA: Diagnosis present

## 2016-08-11 DIAGNOSIS — G2581 Restless legs syndrome: Secondary | ICD-10-CM | POA: Diagnosis present

## 2016-08-11 DIAGNOSIS — K3184 Gastroparesis: Secondary | ICD-10-CM | POA: Diagnosis not present

## 2016-08-11 DIAGNOSIS — E1165 Type 2 diabetes mellitus with hyperglycemia: Secondary | ICD-10-CM

## 2016-08-11 DIAGNOSIS — N179 Acute kidney failure, unspecified: Secondary | ICD-10-CM | POA: Diagnosis present

## 2016-08-11 DIAGNOSIS — F329 Major depressive disorder, single episode, unspecified: Secondary | ICD-10-CM | POA: Diagnosis present

## 2016-08-11 DIAGNOSIS — F419 Anxiety disorder, unspecified: Secondary | ICD-10-CM | POA: Diagnosis present

## 2016-08-11 DIAGNOSIS — M6281 Muscle weakness (generalized): Secondary | ICD-10-CM | POA: Diagnosis not present

## 2016-08-11 DIAGNOSIS — E119 Type 2 diabetes mellitus without complications: Secondary | ICD-10-CM | POA: Diagnosis not present

## 2016-08-11 DIAGNOSIS — K219 Gastro-esophageal reflux disease without esophagitis: Secondary | ICD-10-CM | POA: Diagnosis present

## 2016-08-11 DIAGNOSIS — J9811 Atelectasis: Secondary | ICD-10-CM | POA: Diagnosis present

## 2016-08-11 DIAGNOSIS — E860 Dehydration: Secondary | ICD-10-CM | POA: Diagnosis present

## 2016-08-11 DIAGNOSIS — E7800 Pure hypercholesterolemia, unspecified: Secondary | ICD-10-CM | POA: Diagnosis present

## 2016-08-11 DIAGNOSIS — J302 Other seasonal allergic rhinitis: Secondary | ICD-10-CM | POA: Diagnosis not present

## 2016-08-11 LAB — GLUCOSE, CAPILLARY
GLUCOSE-CAPILLARY: 112 mg/dL — AB (ref 65–99)
Glucose-Capillary: 298 mg/dL — ABNORMAL HIGH (ref 65–99)
Glucose-Capillary: 347 mg/dL — ABNORMAL HIGH (ref 65–99)
Glucose-Capillary: 313 mg/dL — ABNORMAL HIGH (ref 65–99)
Glucose-Capillary: 156 mg/dL — ABNORMAL HIGH (ref 65–99)

## 2016-08-11 LAB — BLOOD GAS, VENOUS
Acid-Base Excess: 1.7 mmol/L (ref 0.0–2.0)
Bicarbonate: 26.3 mmol/L (ref 20.0–28.0)
FIO2: 21
O2 Saturation: 87.9 %
Patient temperature: 98.6
pCO2, Ven: 43 mmHg — ABNORMAL LOW (ref 44.0–60.0)
pH, Ven: 7.403 (ref 7.250–7.430)
pO2, Ven: 58.5 mmHg — ABNORMAL HIGH (ref 32.0–45.0)

## 2016-08-11 LAB — URINALYSIS, ROUTINE W REFLEX MICROSCOPIC
BILIRUBIN URINE: NEGATIVE
Glucose, UA: >=500 mg/dL — AB
Ketones, ur: NEGATIVE mg/dL
Nitrite: NEGATIVE
Protein, ur: >=300 mg/dL — AB
pH: 5 (ref 5.0–8.0)

## 2016-08-11 LAB — COMPREHENSIVE METABOLIC PANEL
ALBUMIN: 3.6 g/dL (ref 3.5–5.0)
ALT: 12 U/L — AB (ref 14–54)
AST: 16 U/L (ref 15–41)
Alkaline Phosphatase: 104 U/L (ref 38–126)
Anion gap: 10 (ref 5–15)
BUN: 31 mg/dL — AB (ref 6–20)
CHLORIDE: 108 mmol/L (ref 101–111)
CO2: 24 mmol/L (ref 22–32)
CREATININE: 1.19 mg/dL — AB (ref 0.44–1.00)
Calcium: 9.5 mg/dL (ref 8.9–10.3)
GFR calc Af Amer: 50 mL/min — ABNORMAL LOW (ref >60)
GFR calc non Af Amer: 43 mL/min — ABNORMAL LOW (ref >60)
GLUCOSE: 328 mg/dL — AB (ref 65–99)
Potassium: 4.3 mmol/L (ref 3.5–5.1)
Sodium: 142 mmol/L (ref 135–145)
Total Bilirubin: 1 mg/dL (ref 0.3–1.2)
Total Protein: 7.3 g/dL (ref 6.5–8.1)

## 2016-08-11 LAB — LACTIC ACID, PLASMA
LACTIC ACID, VENOUS: 1.2 mmol/L (ref 0.5–1.9)
Lactic Acid, Venous: 1.1 mmol/L (ref 0.5–1.9)

## 2016-08-11 LAB — CBC
HCT: 38.6 % (ref 36.0–46.0)
Hemoglobin: 12.5 g/dL (ref 12.0–15.0)
MCH: 27.2 pg (ref 26.0–34.0)
MCHC: 32.4 g/dL (ref 30.0–36.0)
MCV: 83.9 fL (ref 78.0–100.0)
PLATELETS: 284 10*3/uL (ref 150–400)
RBC: 4.6 MIL/uL (ref 3.87–5.11)
RDW: 15.3 % (ref 11.5–15.5)
WBC: 12.1 10*3/uL — ABNORMAL HIGH (ref 4.0–10.5)

## 2016-08-11 LAB — MRSA PCR SCREENING: MRSA by PCR: NEGATIVE

## 2016-08-11 MED ORDER — INSULIN ASPART 100 UNIT/ML ~~LOC~~ SOLN
0.0000 [IU] | Freq: Three times a day (TID) | SUBCUTANEOUS | Status: DC
  Administered 2016-08-11 – 2016-08-12 (×3): 11 [IU] via SUBCUTANEOUS
  Administered 2016-08-12: 13:00:00 8 [IU] via SUBCUTANEOUS
  Administered 2016-08-12 – 2016-08-13 (×2): 5 [IU] via SUBCUTANEOUS
  Administered 2016-08-13: 3 [IU] via SUBCUTANEOUS
  Filled 2016-08-11 (×2): qty 1

## 2016-08-11 MED ORDER — BISACODYL 10 MG RE SUPP
10.0000 mg | Freq: Every day | RECTAL | Status: DC | PRN
  Administered 2016-08-11: 10 mg via RECTAL
  Filled 2016-08-11: qty 1

## 2016-08-11 MED ORDER — DEXTROSE 5 % IV SOLN
2.0000 g | Freq: Every day | INTRAVENOUS | Status: DC
  Administered 2016-08-11 – 2016-08-12 (×3): 2 g via INTRAVENOUS
  Filled 2016-08-11 (×4): qty 2

## 2016-08-11 MED ORDER — ASPIRIN 81 MG PO CHEW
81.0000 mg | CHEWABLE_TABLET | Freq: Every day | ORAL | Status: DC
  Administered 2016-08-11 – 2016-08-13 (×3): 81 mg via ORAL
  Filled 2016-08-11 (×3): qty 1

## 2016-08-11 MED ORDER — INSULIN DETEMIR 100 UNIT/ML ~~LOC~~ SOLN
8.0000 [IU] | Freq: Every day | SUBCUTANEOUS | Status: DC
  Administered 2016-08-11 – 2016-08-12 (×2): 8 [IU] via SUBCUTANEOUS
  Filled 2016-08-11 (×3): qty 0.08

## 2016-08-11 MED ORDER — ACETAMINOPHEN 325 MG PO TABS
650.0000 mg | ORAL_TABLET | Freq: Four times a day (QID) | ORAL | Status: DC | PRN
  Administered 2016-08-11: 650 mg via ORAL
  Filled 2016-08-11: qty 2

## 2016-08-11 MED ORDER — TRAMADOL HCL 50 MG PO TABS: 50.0000 mg | ORAL_TABLET | Freq: Four times a day (QID) | ORAL | Status: DC | PRN

## 2016-08-11 MED ORDER — ENOXAPARIN SODIUM 40 MG/0.4ML ~~LOC~~ SOLN
40.0000 mg | SUBCUTANEOUS | Status: DC
  Administered 2016-08-11 – 2016-08-13 (×3): 40 mg via SUBCUTANEOUS
  Filled 2016-08-11 (×3): qty 0.4

## 2016-08-11 MED ORDER — ACETAMINOPHEN 500 MG PO TABS: 500.0000 mg | ORAL_TABLET | Freq: Three times a day (TID) | ORAL | Status: DC | PRN

## 2016-08-11 MED ORDER — ENSURE ENLIVE PO LIQD
120.0000 mL | Freq: Two times a day (BID) | ORAL | Status: DC
  Administered 2016-08-11 – 2016-08-12 (×3): 120 mL via ORAL

## 2016-08-11 MED ORDER — ACETAMINOPHEN 650 MG RE SUPP: 650.0000 mg | Freq: Four times a day (QID) | RECTAL | Status: DC | PRN

## 2016-08-11 MED ORDER — INSULIN ASPART 100 UNIT/ML ~~LOC~~ SOLN
0.0000 [IU] | Freq: Every day | SUBCUTANEOUS | Status: DC
  Administered 2016-08-12: 3 [IU] via SUBCUTANEOUS

## 2016-08-11 MED ORDER — POLYETHYLENE GLYCOL 3350 17 G PO PACK
17.0000 g | PACK | Freq: Every day | ORAL | Status: DC
  Administered 2016-08-11 – 2016-08-12 (×2): 17 g via ORAL
  Filled 2016-08-11 (×3): qty 1

## 2016-08-11 MED ORDER — DULOXETINE HCL 30 MG PO CPEP
30.0000 mg | ORAL_CAPSULE | Freq: Two times a day (BID) | ORAL | Status: DC
  Administered 2016-08-11 – 2016-08-13 (×5): 30 mg via ORAL
  Filled 2016-08-11 (×5): qty 1

## 2016-08-11 MED ORDER — SODIUM CHLORIDE 0.9 % IV SOLN: Freq: Once | INTRAVENOUS | Status: DC

## 2016-08-11 MED ORDER — METOPROLOL SUCCINATE ER 50 MG PO TB24
50.0000 mg | ORAL_TABLET | Freq: Every day | ORAL | Status: DC
  Administered 2016-08-11 – 2016-08-13 (×3): 50 mg via ORAL
  Filled 2016-08-11 (×3): qty 1

## 2016-08-11 MED ORDER — SODIUM CHLORIDE 0.9 % IV SOLN: INTRAVENOUS | Status: DC

## 2016-08-11 MED ORDER — SODIUM CHLORIDE 0.9 % IV SOLN
Freq: Once | INTRAVENOUS | Status: AC
  Administered 2016-08-11: 05:00:00 via INTRAVENOUS

## 2016-08-11 MED ORDER — ALPRAZOLAM 0.25 MG PO TABS
0.2500 mg | ORAL_TABLET | Freq: Two times a day (BID) | ORAL | Status: DC
  Administered 2016-08-11 – 2016-08-13 (×5): 0.25 mg via ORAL
  Filled 2016-08-11 (×5): qty 1

## 2016-08-11 MED ORDER — DOCUSATE SODIUM 100 MG PO CAPS
100.0000 mg | ORAL_CAPSULE | Freq: Two times a day (BID) | ORAL | Status: DC
  Administered 2016-08-11 – 2016-08-13 (×5): 100 mg via ORAL
  Filled 2016-08-11 (×5): qty 1

## 2016-08-11 MED ORDER — ONDANSETRON HCL 4 MG PO TABS: 4.0000 mg | ORAL_TABLET | Freq: Four times a day (QID) | ORAL | Status: DC | PRN

## 2016-08-11 MED ORDER — PRAMIPEXOLE DIHYDROCHLORIDE 1 MG PO TABS
1.5000 mg | ORAL_TABLET | Freq: Three times a day (TID) | ORAL | Status: DC
  Administered 2016-08-11 – 2016-08-13 (×6): 1.5 mg via ORAL
  Filled 2016-08-11 (×10): qty 2

## 2016-08-11 MED ORDER — INSULIN DETEMIR 100 UNIT/ML ~~LOC~~ SOLN
12.0000 [IU] | Freq: Every day | SUBCUTANEOUS | Status: DC
  Administered 2016-08-11 – 2016-08-13 (×3): 12 [IU] via SUBCUTANEOUS
  Filled 2016-08-11 (×3): qty 0.12

## 2016-08-11 MED ORDER — ONDANSETRON HCL 4 MG/2ML IJ SOLN: 4.0000 mg | Freq: Four times a day (QID) | INTRAMUSCULAR | Status: DC | PRN

## 2016-08-11 NOTE — NC FL2 (Signed)
[place] MEDICAID FL2 LEVEL OF CARE SCREENING TOOL     IDENTIFICATION  Patient Name: [name] Birthdate: 12/07/1940 Sex: female Admission Date (Current Location): 08/10/2016  [facility] and [place] Number:  Herbalist and Address:  [facility],  [place] [address] [place]      Provider Number: [id number]  Attending Physician Name and Address:  Debbe Odea, MD  Relative Name and Phone Number:       Current Level of Care: Hospital Recommended Level of Care: [place] Prior Approval Number:    Date Approved/Denied:   PASRR Number:    Discharge Plan: SNF    Current Diagnoses: Patient Active Problem List   Diagnosis Date Noted  . 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 Acute post-hemorrhagic anemia 07/09/2016  . Symptomatic anemia 07/09/2016  . 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 Abdominal pain, chronic, epigastric 08/10/2015  . Dehydration   . Leukocytosis   . Aspiration pneumonia ([place]) 07/21/2014  . DM type 2 (diabetes mellitus, type 2) ([place]) 07/21/2014  . Palliative care encounter 07/21/2014  . Dysphagia 07/09/2014  . Decubitus ulcer 07/09/2014  . Blood poisoning   . Acute respiratory failure with hypoxia ([place]) 06/30/2014  . Septic shock ([place]) 06/30/2014  . Lactic acidosis 06/30/2014  . Dementia 06/30/2014  . Acute respiratory failure with hypoxemia ([place])   . Hypernatremia   . Acute kidney injury ([place]) 05/28/2014  . Fever 05/27/2014  . Lower urinary tract infectious disease 05/27/2014  . Sepsis ([place]) 05/27/2014  . Acute encephalopathy   . FTT (failure to thrive) in adult   . Gait instability 07/18/2013    Class: Acute  . Low back pain 07/18/2013    Class: Acute  . Nausea with vomiting 07/18/2013    Class: Acute  . Abnormality of gait 04/29/2013  . 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 Head trauma 06/03/2010  .  HYPERCHOLESTEROLEMIA 05/15/2008  . ANXIETY DEPRESSION 05/15/2008  . DEPRESSION 05/15/2008  . Essential hypertension 05/15/2008  . OSTEOARTHRITIS 05/15/2008  . Edema 05/15/2008  . DYSPNEA ON EXERTION 05/15/2008  . ELECTROCARDIOGRAM, ABNORMAL 05/15/2008    Orientation RESPIRATION BLADDER Height & Weight     Self  Normal Incontinent Weight: 202 lb 4.8 oz (91.8 kg) Height:     BEHAVIORAL SYMPTOMS/MOOD NEUROLOGICAL BOWEL NUTRITION STATUS  Other (Comment) (no behaviors)   Incontinent Diet  AMBULATORY STATUS COMMUNICATION OF NEEDS Skin   Total Care Verbally Surgical wounds                       Personal Care Assistance Level of Assistance  Total care       Total Care Assistance: Maximum assistance   Functional Limitations Info  Sight, Hearing, Speech Sight Info: Adequate Hearing Info: Adequate Speech Info: Adequate    SPECIAL CARE FACTORS FREQUENCY                       Contractures      Additional Factors Info  Code Status Code Status Info: DNR             Current Medications (08/11/2016):  This is the current hospital active medication list Current Facility-Administered Medications  Medication Dose Route Frequency Provider Last Rate Last Dose  . 0.9 %  sodium chloride infusion   Intravenous Continuous Debbe Odea, MD 100 mL/hr at 08/11/16 1145    . acetaminophen (TYLENOL) tablet 650 mg  650 mg Oral Q6H PRN Danford, Suann Larry, MD       Or  . acetaminophen (TYLENOL) suppository 650 mg  650 mg Rectal Q6H PRN Danford, Suann Larry, MD      . ALPRAZolam Duanne Moron) tablet 0.25 mg  0.25 mg Oral BID Debbe Odea, MD   0.25 mg at 08/11/16 1431  . aspirin chewable tablet 81 mg  81 mg Oral Daily Danford, Suann Larry, MD   81 mg at 08/11/16 0900  . bisacodyl (DULCOLAX) suppository 10 mg  10 mg Rectal Daily PRN Edwin Dada, MD   10 mg at 08/11/16 1145  . cefTRIAXone (ROCEPHIN) 2 g in dextrose 5 % 50 mL IVPB  2 g Intravenous QHS Edwin Dada, MD   Stopped at 08/11/16 0556  . docusate sodium (COLACE) capsule 100 mg  100 mg Oral BID Edwin Dada, MD   100 mg at 08/11/16 0900  . DULoxetine (CYMBALTA) DR capsule 30 mg  30 mg Oral BID Edwin Dada, MD   30 mg at 08/11/16 0900  . enoxaparin (LOVENOX) injection 40 mg  40 mg Subcutaneous Q24H Danford, Suann Larry, MD   40 mg at 08/11/16 0900  . feeding supplement (ENSURE ENLIVE) (ENSURE ENLIVE) liquid 120 mL  120 mL Oral BID Edwin Dada, MD   120 mL at 08/11/16 0913  . insulin aspart (novoLOG) injection 0-15 Units  0-15 Units Subcutaneous TID WC Danford, Suann Larry, MD   11 Units at 08/11/16 1300  . insulin aspart (novoLOG) injection 0-5 Units  0-5 Units Subcutaneous QHS Danford, Christopher P, MD      . insulin detemir (LEVEMIR) injection 12 Units  12 Units Subcutaneous Daily Danford, Suann Larry, MD   12 Units at 08/11/16 0900  . insulin detemir (LEVEMIR) injection 8 Units  8 Units Subcutaneous QHS Danford, Christopher P, MD      . metoprolol succinate (TOPROL-XL) 24 hr tablet 50 mg  50 mg Oral Daily Danford, Suann Larry, MD   50 mg at 08/11/16 0900  . ondansetron (ZOFRAN) tablet 4 mg  4 mg Oral Q6H PRN Danford, Suann Larry, MD       Or  . ondansetron (ZOFRAN) injection 4 mg  4 mg Intravenous Q6H PRN Danford, Suann Larry, MD      . polyethylene glycol (MIRALAX / GLYCOLAX) packet 17 g  17 g Oral Daily Danford, Suann Larry, MD   17 g at 08/11/16 0900  . pramipexole (MIRAPEX) tablet 1.5 mg  1.5 mg Oral TID Edwin Dada, MD   1.5 mg at 08/11/16 0900  . traMADol (ULTRAM) tablet 50 mg  50 mg Oral Q6H PRN Danford, Suann Larry, MD         Discharge Medications: Please see discharge summary for a list of discharge medications.  Relevant Imaging Results:  Relevant Lab Results:   Additional Information ssn:[id number]  [place] Feldpausch, Randall An, LCSW

## 2016-08-11 NOTE — Progress Notes (Signed)
Initial Nutrition Assessment  DOCUMENTATION CODES:   Obesity unspecified  INTERVENTION:  - Continue Ensure Enlive po BID, each supplement provides 350 kcal and 20 grams of protein - Continue to encourage PO intakes of meals and supplements. - RD will continue to monitor for additional nutrition-related needs.  NUTRITION DIAGNOSIS:   Inadequate oral intake related to acute illness as evidenced by per patient/family report  GOAL:   Patient will meet greater than or equal to 90% of their needs  MONITOR:   PO intake, Supplement acceptance, Weight trends, Labs  REASON FOR ASSESSMENT:   Malnutrition Screening Tool  ASSESSMENT:    76 y.o. female with a past medical history significant for dementia, CKD III, IDDM, and HTN who presents with 2 days altered mental status.  Pt seen for MST. BMI and estimated nutrition needs based on weight from 5/19 (102 kg) as no new weight since that time; no weight obtained at time of admission. Pt sleeping throughout visit with daughter at bedside; daughter provides all information. Pt lives at [facility] and eats 3 meals a day plus at least one snack/day (daughter often brings her items such as sandwiches). Daughter reports pt has a very good appetite at baseline and can feed herself. On Monday (6/18) pt was vomiting after attempting to eat dinner, on Tuesday (6/19) pt only ate 2-3 bites and needed to be fed by daughter, yesterday (6/20) pt refused to eat and drink. Daughter reports a similar course of events with eating when pt has had infections in the past. A grilled cheese and chicken noodle soup were ordered for lunch and daughter is going to help with feeding. Pt was not having any chewing or swallowing difficulties at baseline but daughter is going to monitor during lunch if pt seems to have any difficulties.   Physical assessment shows no muscle and no fat wasting. Will monitor for weight trends during hospitalization.  Medications reviewed; 100  mg Colace BID, sliding scale Novolog, 12 units Levemir/day, 8 units Levemir/day at bedtime, 17 g Miralax/day. Labs reviewed; CBGs: 298 and 347 mg/dL, BUN: 31 mg/dL, creatinine: 1.19 mg/dL, GFR: 50 mL/min.   IVF: NS @ 100 mL/hr.    Diet Order:  Diet Carb Modified Fluid consistency: Thin; Room service appropriate? Yes  Skin:  Reviewed, no issues  Last BM:  6/20  Height:   Ht Readings from Last 1 Encounters:  07/09/16 5\' 7"  (1.702 m)    Weight:   Wt Readings from Last 1 Encounters:  07/09/16 224 lb 13.9 oz (102 kg)    Ideal Body Weight:  61.36 kg  BMI:  35.17 kg/m2  Estimated Nutritional Needs:   Kcal:  1530-1735 (15-17 kcal/kg)  Protein:  75-85 grams  Fluid:  >/= 1.7 L/day  EDUCATION NEEDS:   No education needs identified at this time    Jarome Matin, MS, RD, LDN, CNSC Inpatient Clinical Dietitian Pager # [phone] After hours/weekend pager # [phone]

## 2016-08-11 NOTE — Progress Notes (Signed)
PROGRESS NOTE    [name]   HTD:[id number]  DOB: 15-Nov-1940  DOA: 08/10/2016 PCP: [name], MD   Brief Narrative:  [name] is a 76 y.o. 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 Also noted to have rectal fecal impaction on CT.    Subjective: Mental status improving per daughter. No mention of constipation but has been having watery stool - no mention of stool from vagina. Patient is pleasantly confused and has no complaints.   Assessment & Plan:   Principal Problem:   Severe sepsis due to UTI- ? fistula  - Emphysematous cystitis, confusion, leukocytosis, lactic acidosis - ? Of fistula- RN attempted to disimpact but only encountered soft stool- apparently had no impaction in ER either - cont to treat with Vanc and Ceftriaxone for today- f/u on cultures- from nursing facility so can have resistant organisms- if cultures negative for staph tomorrow, would recommend d/c Vanc -  await surgery eval  Active Problems: Renal insufficiency - baseline Cr 0.8- 0.9 - cont to hydrate and treat UTI and follow    Acute encephalopathy superimposed on Dementia - due to UTI - resolving    DM type 2 (diabetes mellitus, type 2)  - Levemir and SSI    Essential hypertension - cont current meds and follow       DVT prophylaxis: Lovenox Code Status: DNR Family Communication: daughter at bedside would like to be updated tomorrow if possible  Disposition Plan: return to [facility] when stable Consultants:   gen surgery Procedures:    Antimicrobials:  Anti-infectives    Start     Dose/Rate Route Frequency Ordered Stop   08/11/16 0130  cefTRIAXone (ROCEPHIN) 2 g in dextrose 5 % 50 mL IVPB     2 g 100 mL/hr over 30 Minutes Intravenous Daily at bedtime 08/11/16 0124     08/10/16 2330  vancomycin (VANCOCIN) IVPB 1000 mg/200 mL premix     1,000 mg 200 mL/hr over 60 Minutes Intravenous  Once 08/10/16 2327 08/11/16 0105   08/10/16 2330  piperacillin-tazobactam (ZOSYN) IVPB 3.375 g     3.375 g 100 mL/hr over 30 Minutes Intravenous  Once 08/10/16 2327 08/11/16 0103       Objective: Vitals:   08/11/16 0827 08/11/16 0833 08/11/16 1329 08/11/16 1559  BP: (!) 166/105 (!) 166/105 (!) 111/49   Pulse: 81 87 (!) 112   Resp: 20  15   Temp:   98.3 F (36.8 C)   TempSrc:   Axillary   SpO2: 98%  91%   Weight:    91.8 kg (202 lb 4.8 oz)    Intake/Output Summary (Last 24 hours) at 08/11/16 1825 Last data filed at 08/11/16 1528  Gross per 24 hour  Intake             3550 ml  Output                0 ml  Net             3550 ml   Filed Weights   08/11/16 1559  Weight: 91.8 kg (202 lb 4.8 oz)    Examination: General exam: Appears comfortable  HEENT: PERRLA, oral mucosa moist, no sclera icterus or thrush Respiratory system: Clear to auscultation. Respiratory effort normal. Cardiovascular system: S1 & S2  heard, RRR.  No murmurs  Gastrointestinal system: Abdomen soft, non-tender, nondistended. Normal bowel sound. No organomegaly Central nervous system: Alert -  No focal neurological deficits. Extremities: No cyanosis, clubbing or edema Skin: No rashes or ulcers Psychiatry:  Mood & affect appropriate.     Data Reviewed: I have personally reviewed following labs and imaging studies  CBC:  Recent Labs Lab 08/10/16 2158 08/11/16 0454  WBC 13.1* 12.1*  HGB 14.0 12.5  HCT 42.5 38.6  MCV 83.2 83.9  PLT 362 637   Basic Metabolic Panel:  Recent Labs Lab 08/10/16 2158 08/11/16 0454  NA 141 142  K 4.1 4.3  CL 104 108  CO2 25 24  GLUCOSE 243* 328*  BUN 31* 31*  CREATININE 1.14* 1.19*  CALCIUM 10.2 9.5   GFR: Estimated Creatinine Clearance: 46.1 mL/min (A) (by C-G formula based on SCr of 1.19 mg/dL (H)). Liver Function Tests:  Recent  Labs Lab 08/10/16 2158 08/11/16 0454  AST 17 16  ALT 13* 12*  ALKPHOS 118 104  BILITOT 0.8 1.0  PROT 8.2* 7.3  ALBUMIN 4.0 3.6    Recent Labs Lab 08/10/16 2158  LIPASE 24   No results for input(s): AMMONIA in the last 168 hours. Coagulation Profile: No results for input(s): INR, PROTIME in the last 168 hours. Cardiac Enzymes: No results for input(s): CKTOTAL, CKMB, CKMBINDEX, TROPONINI in the last 168 hours. BNP (last 3 results) No results for input(s): PROBNP in the last 8760 hours. HbA1C: No results for input(s): HGBA1C in the last 72 hours. CBG:  Recent Labs Lab 08/10/16 2138 08/11/16 0307 08/11/16 0752 08/11/16 1230 08/11/16 1729  GLUCAP 250* 298* 347* 313* 112*   Lipid Profile: No results for input(s): CHOL, HDL, LDLCALC, TRIG, CHOLHDL, LDLDIRECT in the last 72 hours. Thyroid Function Tests: No results for input(s): TSH, T4TOTAL, FREET4, T3FREE, THYROIDAB in the last 72 hours. Anemia Panel: No results for input(s): VITAMINB12, FOLATE, FERRITIN, TIBC, IRON, RETICCTPCT in the last 72 hours. Urine analysis:    Component Value Date/Time   COLORURINE AMBER (A) 08/11/2016 0012   APPEARANCEUR CLOUDY (A) 08/11/2016 0012   LABSPEC >1.046 (H) 08/11/2016 0012   PHURINE 5.0 08/11/2016 0012   GLUCOSEU >=500 (A) 08/11/2016 0012   HGBUR SMALL (A) 08/11/2016 0012   BILIRUBINUR NEGATIVE 08/11/2016 0012   KETONESUR NEGATIVE 08/11/2016 0012   PROTEINUR >=300 (A) 08/11/2016 0012   UROBILINOGEN 0.2 07/20/2014 2228   NITRITE NEGATIVE 08/11/2016 0012   LEUKOCYTESUR LARGE (A) 08/11/2016 0012   Sepsis Labs: @LABRCNTIP (procalcitonin:4,lacticidven:4) ) Recent Results (from the past 240 hour(s))  Blood culture (routine x 2)     Status: None (Preliminary result)   Collection Time: 08/10/16 10:09 PM  Result Value Ref Range Status   Specimen Description BLOOD LEFT HAND  Final   Special Requests IN PEDIATRIC BOTTLE Blood Culture adequate volume  Final   Culture   Final    NO  GROWTH < 24 HOURS Performed at [facility], [place] [address], [place], [place] [address]    Report Status PENDING  Incomplete  MRSA PCR Screening     Status: None   Collection Time: 08/11/16  7:30 AM  Result Value Ref Range Status   MRSA by PCR NEGATIVE NEGATIVE Final    Comment:        The GeneXpert MRSA Assay (FDA approved for NASAL specimens only), is one component of a comprehensive MRSA colonization surveillance program. It is not intended to diagnose MRSA infection nor to guide or  monitor treatment for MRSA infections.          Radiology Studies: Ct Head Wo Contrast  Result Date: 08/10/2016 CLINICAL DATA:  76 year old female with altered mental status. EXAM: CT HEAD WITHOUT CONTRAST TECHNIQUE: Contiguous axial images were obtained from the base of the skull through the vertex without intravenous contrast. COMPARISON:  Brain MRI dated 07/11/2014 FINDINGS: Brain: There is moderate age-related atrophy and chronic microvascular ischemic changes. There is dilatation of the ventricles out of proportion with the sulci which may represent central volume loss versus normal pressure hydrocephalus. Clinical correlation is recommended. There is no acute intracranial hemorrhage. No mass effect or midline shift noted. No extra-axial fluid collection. Vascular: No hyperdense vessel or unexpected calcification. Skull: Normal. Negative for fracture or focal lesion. Sinuses/Orbits: No acute finding. Other: None. IMPRESSION: 1. No acute intracranial hemorrhage. 2. Moderate age-related atrophy and chronic microvascular ischemic changes. If symptoms persist, and there are no contraindications, MRI may provide better evaluation if clinically indicated. Electronically Signed   By: Anner Crete M.D.   On: 08/10/2016 23:50   Ct Abdomen Pelvis W Contrast  Result Date: 08/11/2016 CLINICAL DATA:  Abdominal pain, vomiting and diarrhea. EXAM: CT ABDOMEN AND PELVIS WITH CONTRAST TECHNIQUE:  Multidetector CT imaging of the abdomen and pelvis was performed using the standard protocol following bolus administration of intravenous contrast. CONTRAST:  17mL ISOVUE-300 IOPAMIDOL (ISOVUE-300) INJECTION 61% COMPARISON:  CT abdomen 07/30/2015.  CTA 09/15/2010 FINDINGS: Lower chest: Mild atelectasis in both lower lobes. No pleural effusion. Hepatobiliary: No focal hepatic lesion. Mild gallbladder distention with layering hyperdensity, stones versus sludge. No evidence of pericholecystic inflammation. No biliary dilatation. Pancreas: No ductal dilatation or inflammation. Spleen: Small enhancing splenic lesions are again seen, some which are not as well visualized on the current exam. No new focal abnormality. Adrenals/Urinary Tract: Unchanged 2 cm left adrenal nodule. The right adrenal gland is normal. No hydronephrosis or perinephric edema. Small cortical hypodensity in the upper left kidney is unchanged from prior. Subcentimeter right renal cortical hypodensities are too small to characterize. There is delayed renal excretion bilaterally urinary bladder is minimally distended with wall thickening. There is air in the bladder. Mild perivesicular soft tissue stranding. Stomach/Bowel: The rectum is distended with stool spanning 7.6 cm. There is rectal wall thickening and surrounding soft tissue edema. Diffuse colonic tortuosity. Small to moderate stool in the more proximal colon. No additional colonic wall thickening. No small bowel dilatation or inflammation. The stomach is decompressed. The appendix is not confidently visualized, no right lower quadrant or pericecal inflammation. Vascular/Lymphatic: Normal caliber abdominal aorta. No abdominal or pelvic adenopathy. Reproductive: Post hysterectomy. There is air in the vagina. No adnexal mass. Other: No ascites or free air. Laxity of the anterior abdominal wall without frank hernia. Musculoskeletal: Chronic L3 compression fracture with augmentation. Chronic  prominent Schmorl's nodes within superior and inferior endplates of L1. Multilevel degenerative change throughout the lumbar spine. Intramedullary rod in the right femur is partially included. IMPRESSION: 1. Rectal distention with wall thickening and surrounding soft tissue edema suspicious for fecal impaction. Colonic tortuosity with moderate colonic stool burden, likely chronic constipation. No bowel obstruction. 2. Bladder wall thickening with mild perivesicular edema. This is likely secondary to cystitis. There is air in the urinary bladder, which may be iatrogenic, recommend correlation for catheterization attempts. Alternatively, emphysematous cystitis is considered, however no air in the bladder wall. Colovesicular fistula is not excluded. 3. Post hysterectomy. 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 4. Stones  or sludge in the gallbladder without CT findings of cholecystitis. 5. Hyperenhancing splenic lesions are again seen, not as well seen compared to prior exam, possibly due to phase contrast. These are stable compared to 2012 CT suggesting hemangiomas, but nonspecific. 6. Stable left adrenal adenoma. Electronically Signed   By: Jeb Levering M.D.   On: 08/11/2016 00:07   Dg Chest Portable 1 View  Result Date: 08/10/2016 CLINICAL DATA:  Initial evaluation for acute altered mental status. EXAM: PORTABLE CHEST 1 VIEW COMPARISON:  The prior radiograph from 07/09/2016 FINDINGS: Mild cardiomegaly, stable. Mediastinal silhouette within normal limits. Lungs are hypoinflated. Patchy bibasilar opacities favored to reflect atelectasis/bronchovascular crowding, although superimposed infiltrates could be considered in the correct clinical setting. No pulmonary edema or pleural effusion. No pneumothorax. No acute osseous abnormality. IMPRESSION: Shallow lung inflation with patchy bibasilar opacities, left greater than right. Atelectasis/bronchovascular crowding is favored, although  superimposed infiltrates could be considered in the correct clinical setting. Electronically Signed   By: Jeannine Boga M.D.   On: 08/10/2016 23:04      Scheduled Meds: . ALPRAZolam  0.25 mg Oral BID  . aspirin  81 mg Oral Daily  . docusate sodium  100 mg Oral BID  . DULoxetine  30 mg Oral BID  . enoxaparin (LOVENOX) injection  40 mg Subcutaneous Q24H  . feeding supplement (ENSURE ENLIVE)  120 mL Oral BID  . insulin aspart  0-15 Units Subcutaneous TID WC  . insulin aspart  0-5 Units Subcutaneous QHS  . insulin detemir  12 Units Subcutaneous Daily  . insulin detemir  8 Units Subcutaneous QHS  . metoprolol succinate  50 mg Oral Daily  . polyethylene glycol  17 g Oral Daily  . pramipexole  1.5 mg Oral TID   Continuous Infusions: . sodium chloride 100 mL/hr at 08/11/16 1145  . cefTRIAXone (ROCEPHIN)  IV Stopped (08/11/16 0556)     LOS: 0 days    Time spent in minutes: 35    Debbe Odea, MD [facility] Pager: [email or web address] Password [facility] 08/11/2016, 6:25 PM

## 2016-08-11 NOTE — Progress Notes (Signed)
Plan for d/c to SNF, discharge planning per CSW. [phone] 

## 2016-08-11 NOTE — H&P (Addendum)
History and Physical  Patient Name: [name]     UVO:[id number]    DOB: 1940-07-30    DOA: 08/10/2016 PCP: [name], MD  Patient coming from: [facility]  Chief Complaint: AMS      HPI: [name] is a 76 y.o. female with a past medical history significant for dementia, CKD III, IDDM, and HTN who presents with 2 days altered mental status.  Caveat that patient is altered and can provide no independent history, so all history collected from daughter at bedside and EMS via [place].  The patient was in her usual state of health (wheelchair bound but alert and oriented 4, interactive, mild short-term memory loss), until yesterday her daughter visited her and noticed that she seemed a little "cloudy". Then today her daughter returned tonight and found that she was virtually unresponsive, appeared listless and tired, made virtually no verbal responses, and was grunting occasionally.  There was no reported cough, sputum production, dyspnea.  There was no reported change in urinary status.    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 Anxiety   . Depression   . Diabetes mellitus   . Dyspnea   . GERD (gastroesophageal reflux disease)   . Headache(784.0)   . HTN (hypertension)    dr Johnsie Cancel  . Hypercholesteremia    DENIES  . Osteoarthritis   . PONV (postoperative nausea and vomiting)   . RLS (restless legs syndrome)   . Stress     Past Surgical History:  Procedure Laterality Date  . ABDOMINAL HYSTERECTOMY    . COLONOSCOPY    . JOINT REPLACEMENT    . left forearm fracture with ORIF Left   . LUMBAR LAMINECTOMY/DECOMPRESSION MICRODISCECTOMY N/A 04/18/2012   Procedure: LUMBAR LAMINECTOMY/DECOMPRESSION MICRODISCECTOMY 3 LEVELS;  Surgeon: Winfield Cunas, MD;  Location: [facility];  Service: Neurosurgery;  Laterality: N/A;  Lumbar three-four, lumbar four-five, lumbar five-sacral one decompression. Synovial cyst resection lumbar four-five  . ORIF PERIPROSTHETIC FRACTURE Right 07/06/2016   Procedure: OPEN REDUCTION INTERNAL FIXATION (ORIF) PERIPROSTHETIC FRACTURE;  Surgeon: Gaynelle Arabian, MD;  Location: [facility];  Service: Orthopedics;  Laterality: Right;  . REPLACEMENT TOTAL KNEE Right   . WISDOM TOOTH EXTRACTION      Social History: Patient lives at [facility].  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  The patient is now wheelchair bound.  Nonsmoker.  Daughter is POA.  Allergies  Allergen Reactions  . Morphine And Related Other (See Comments)  Reaction:  Hallucinations   . Oxycodone Other (See Comments)    Reaction:  Hallucinations  . Sulfonamide Derivatives Swelling and Other (See Comments)    Reaction:  Unspecified swelling reaction     Family history: family history includes Diabetes in her father; High blood pressure in her father and mother.  Prior to Admission medications   Medication Sig Start Date End Date Taking? Authorizing Provider  acetaminophen (TYLENOL) 325  MG tablet Take 650 mg by mouth 2 (two) times daily.   Yes [provider]  acetaminophen (TYLENOL) 500 MG tablet Take 500 mg by mouth every 8 (eight) hours as needed for mild pain.   Yes [provider]  ALPRAZolam (XANAX) 0.25 MG tablet Take 1 tablet (0.25 mg total) by mouth 2 (two) times daily as needed for anxiety. Patient taking differently: Take 0.25 mg by mouth 2 (two) times daily.  07/10/16  Yes Florencia Reasons, MD  amLODipine (NORVASC) 5 MG tablet Take 1 tablet (5 mg total) by mouth daily. 07/14/14  Yes Ghimire, Henreitta Leber, MD  aspirin 81 MG chewable tablet Chew 81 mg by mouth daily.   Yes [provider]  bisacodyl (DULCOLAX) 10 MG suppository Place 1 suppository (10 mg total) rectally daily as needed for moderate constipation. 07/08/16  Yes Perkins, Alexzandrew L, PA-C  docusate sodium (COLACE) 100 MG capsule Take 100 mg by mouth 2 (two) times daily.   Yes [provider]  DULoxetine (CYMBALTA) 30 MG capsule Take 30 mg by mouth 2 (two) times daily.   Yes [provider]  insulin aspart (NOVOLOG) 100 UNIT/ML injection Inject 2-11 Units into the skin 3 (three) times daily before meals. Pt uses per sliding scale:    101-150:  2 units 151-200:  3 units  201-250:  5 units  251-300:  7 units  301-350:  9 units  Greater than 350:  11 units Greater than 400 or less than 60:  Call MD   Yes [provider]  Insulin Detemir (LEVEMIR FLEXTOUCH) 100 UNIT/ML Pen Inject 8-12 Units into the skin 2 (two) times daily. Pt uses 12 units in the morning and 8 units at bedtime.   Yes [provider]  loratadine (CLARITIN) 10 MG tablet Take 10 mg by mouth daily.    Yes [provider]  losartan (COZAAR) 100 MG tablet Take 100 mg by mouth every evening.    Yes [provider]  Magnesium Oxide 500 MG TABS Take 500 mg by mouth daily.    Yes [provider]  methocarbamol (ROBAXIN) 500 MG tablet Take 1 tablet (500 mg total) by mouth  every 6 (six) hours as needed for muscle spasms. 07/08/16  Yes Perkins, Alexzandrew L, PA-C  metoprolol succinate (TOPROL-XL) 50 MG 24 hr tablet Take 1 tablet (50 mg total) by mouth daily. 07/04/14  Yes Robbie Lis, MD  Nutritional Supplements (NUTRITIONAL SUPPLEMENT PLUS) LIQD Take 120 mLs by mouth 2 (two) times daily. Med [employer]   Yes [provider]  omeprazole (PRILOSEC) 40 MG capsule Take 40 mg by mouth daily.   Yes [provider]  polyethylene glycol (MIRALAX / GLYCOLAX) packet Take 17 g by mouth daily. 07/26/13  Yes [name], MD  potassium bicarbonate (K-LYTE) 25 MEQ disintegrating tablet Take 25 mEq by mouth daily.   Yes [provider]  pramipexole (MIRAPEX) 0.25 MG tablet Take 1.5 mg by mouth 3 (three) times daily.    Yes [provider]  traMADol (ULTRAM) 50 MG tablet  Take 50 mg by mouth every 6 (six) hours as needed for moderate pain.   Yes [provider]       Physical Exam: BP (!) 155/97 (BP Location: Right Arm)   Pulse 86   Temp 98.2 F (36.8 C) (Oral)   Resp 18   SpO2 95%  General appearance: Overweight, elderly adult female, awake but listless, very sluggish responses if at all. Eyes: Anicteric, conjunctiva pink, lids and lashes normal. PERRL.    ENT: No nasal deformity, discharge, epistaxis.  Hearing normal. OP dry without lesions.   Neck: No neck masses.  Trachea midline.  No thyromegaly/tenderness. Lymph: No cervical or supraclavicular lymphadenopathy. Skin: Warm and moist.  No jaundice.  No suspicious rashes or lesions. Cardiac: Tachycardic, regular, nl S1-S2, no murmurs appreciated.  Capillary refill is brisk.  JVP not visible.  No LE edema.  Radial and DP pulses 2+ and symmetric. Respiratory: Normal respiratory rate and rhythm.  CTAB without rales or wheezes. Abdomen: Abdomen soft.  No focal  TTP no guarding or rebound. No ascites, distension, hepatosplenomegaly.   MSK: No deformities or effusions.  No cyanosis or  clubbing. Neuro: Cranial nerves 3-12 itnact, exam limited by difficulty following commands.Speech is thick.  Muscle strength very weak, but symmetric.    Psych: Awake but sluggish, no oriented to hospital, city, year.  Responses very slow.  Attention diminished.       Labs on Admission:  I have personally reviewed following labs and imaging studies: CBC:  Recent Labs Lab 08/10/16 2158  WBC 13.1*  HGB 14.0  HCT 42.5  MCV 83.2  PLT 626   Basic Metabolic Panel:  Recent Labs Lab 08/10/16 2158  NA 141  K 4.1  CL 104  CO2 25  GLUCOSE 243*  BUN 31*  CREATININE 1.14*  CALCIUM 10.2   GFR: CrCl cannot be calculated (Unknown ideal weight.).  Liver Function Tests:  Recent Labs Lab 08/10/16 2158  AST 17  ALT 13*  ALKPHOS 118  BILITOT 0.8  PROT 8.2*  ALBUMIN 4.0    Recent Labs Lab 08/10/16 2158  LIPASE 24   CBG:  Recent Labs Lab 08/10/16 2138  GLUCAP 250*   Sepsis Labs: Lactate 2.35      Radiological Exams on Admission: Personally reviewed CXR shows no focal opacities; CT head and CT abdomen reports reviewed: Ct Head Wo Contrast  Result Date: 08/10/2016 CLINICAL DATA:  76 year old female with altered mental status. EXAM: CT HEAD WITHOUT CONTRAST TECHNIQUE: Contiguous axial images were obtained from the base of the skull through the vertex without intravenous contrast. COMPARISON:  Brain MRI dated 07/11/2014 FINDINGS: Brain: There is moderate age-related atrophy and chronic microvascular ischemic changes. There is dilatation of the ventricles out of proportion with the sulci which may represent central volume loss versus normal pressure hydrocephalus. Clinical correlation is recommended. There is no acute intracranial hemorrhage. No mass effect or midline shift noted. No extra-axial fluid collection. Vascular: No hyperdense vessel or unexpected calcification. Skull: Normal. Negative for fracture or focal lesion. Sinuses/Orbits: No acute finding. Other: None.  IMPRESSION: 1. No acute intracranial hemorrhage. 2. Moderate age-related atrophy and chronic microvascular ischemic changes. If symptoms persist, and there are no contraindications, MRI may provide better evaluation if clinically indicated. Electronically Signed   By: Anner Crete M.D.   On: 08/10/2016 23:50   Ct Abdomen Pelvis W Contrast  Result Date: 08/11/2016 CLINICAL DATA:  Abdominal pain, vomiting and diarrhea. EXAM: CT ABDOMEN AND PELVIS WITH CONTRAST TECHNIQUE: Multidetector CT  imaging of the abdomen and pelvis was performed using the standard protocol following bolus administration of intravenous contrast. CONTRAST:  172mL ISOVUE-300 IOPAMIDOL (ISOVUE-300) INJECTION 61% COMPARISON:  CT abdomen 07/30/2015.  CTA 09/15/2010 FINDINGS: Lower chest: Mild atelectasis in both lower lobes. No pleural effusion. Hepatobiliary: No focal hepatic lesion. Mild gallbladder distention with layering hyperdensity, stones versus sludge. No evidence of pericholecystic inflammation. No biliary dilatation. Pancreas: No ductal dilatation or inflammation. Spleen: Small enhancing splenic lesions are again seen, some which are not as well visualized on the current exam. No new focal abnormality. Adrenals/Urinary Tract: Unchanged 2 cm left adrenal nodule. The right adrenal gland is normal. No hydronephrosis or perinephric edema. Small cortical hypodensity in the upper left kidney is unchanged from prior. Subcentimeter right renal cortical hypodensities are too small to characterize. There is delayed renal excretion bilaterally urinary bladder is minimally distended with wall thickening. There is air in the bladder. Mild perivesicular soft tissue stranding. Stomach/Bowel: The rectum is distended with stool spanning 7.6 cm. There is rectal wall thickening and surrounding soft tissue edema. Diffuse colonic tortuosity. Small to moderate stool in the more proximal colon. No additional colonic wall thickening. No small bowel  dilatation or inflammation. The stomach is decompressed. The appendix is not confidently visualized, no right lower quadrant or pericecal inflammation. Vascular/Lymphatic: Normal caliber abdominal aorta. No abdominal or pelvic adenopathy. Reproductive: Post hysterectomy. There is air in the vagina. No adnexal mass. Other: No ascites or free air. Laxity of the anterior abdominal wall without frank hernia. Musculoskeletal: Chronic L3 compression fracture with augmentation. Chronic prominent Schmorl's nodes within superior and inferior endplates of L1. Multilevel degenerative change throughout the lumbar spine. Intramedullary rod in the right femur is partially included. IMPRESSION: 1. Rectal distention with wall thickening and surrounding soft tissue edema suspicious for fecal impaction. Colonic tortuosity with moderate colonic stool burden, likely chronic constipation. No bowel obstruction. 2. Bladder wall thickening with mild perivesicular edema. This is likely secondary to cystitis. There is air in the urinary bladder, which may be iatrogenic, recommend correlation for catheterization attempts. Alternatively, emphysematous cystitis is considered, however no air in the bladder wall. Colovesicular fistula is not excluded. 3. Post hysterectomy. 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 4. Stones or sludge in the gallbladder without CT findings of cholecystitis. 5. Hyperenhancing splenic lesions are again seen, not as well seen compared to prior exam, possibly due to phase contrast. These are stable compared to 2012 CT suggesting hemangiomas, but nonspecific. 6. Stable left adrenal adenoma. Electronically Signed   By: Jeb Levering M.D.   On: 08/11/2016 00:07   Dg Chest Portable 1 View  Result Date: 08/10/2016 CLINICAL DATA:  Initial evaluation for acute altered mental status. EXAM: PORTABLE CHEST 1 VIEW COMPARISON:  The prior radiograph from 07/09/2016 FINDINGS: Mild cardiomegaly,  stable. Mediastinal silhouette within normal limits. Lungs are hypoinflated. Patchy bibasilar opacities favored to reflect atelectasis/bronchovascular crowding, although superimposed infiltrates could be considered in the correct clinical setting. No pulmonary edema or pleural effusion. No pneumothorax. No acute osseous abnormality. IMPRESSION: Shallow lung inflation with patchy bibasilar opacities, left greater than right. Atelectasis/bronchovascular crowding is favored, although superimposed infiltrates could be considered in the correct clinical setting. Electronically Signed   By: Jeannine Boga M.D.   On: 08/10/2016 23:04    Echocardiogram 2016: Re[port reviewed EF 60-65% Grade I DD         Assessment/Plan  1. Sepsis:  Suspected source urinary. Organism unknown.   Patient meets criteria  given tachycardia, leukocytosis, and evidence of organ dysfunction.  Lactate 2.35 mmol/L and repeat ordered within 6 hours.  This patient is not at high risk of poor outcomes with a qSOFA score of 1.  Antibiotics delivered in the ED.    -Sepsis bundle utilized:  -Blood and urine cultures drawn  -Fluid bolus given in ED, will repeat lactic acid  -Antibiotics: ceftriaxone  -Repeat renal function and complete blood count in AM     2. 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 Insulin dependent diabetes:  Hyperglycemic at admission -Continue Levemir -SSI as needed  4. Hypertension:  BP slightly high at admission. -Continue metoprolol -Hold ARB and Norvasc until hemodynamics clearer  5. Dementia with encephalopathy: From #1 above. Delirium precautions:   -Lights and TV off, minimize interruptions at night  -Blinds open and lights on during day  -Glasses/hearing aid with patient  -Frequent reorientation  -PT/OT when able  -Avoid sedation medications/Beers list medications            DVT  prophylaxis: Lovenox  Code Status: DO NOT RESUSCITATE  Family Communication: Daughter/POA at bedside  Disposition Plan: Anticipate IV empiric antibiotics and fulids and follow culture data.  Consult to surgery for suspected fistulas. Consults called: None overnight Admission status: INPATIENT        Medical decision making: Patient seen at 2:20 AM on 08/11/2016.  The patient was discussed with Dr. Ellender Hose.  What exists of the patient's chart was reviewed in depth and summarized above.  Clinical condition: BP and heart rate stable/ improving respectively.  Mentation improving.        Edwin Dada [facility] Pager [phone]      At the time of admission, it appears that the appropriate admission status for this patient is INPATIENT. This is judged to be reasonable and necessary in order to provide the required intensity of service to ensure the patient's safety given the presenting symptoms, physical exam findings, and initial radiographic and laboratory data in the context of their chronic comorbidities.  Together, these circumstances are felt to place her at high risk for further clinical deterioration threatening life, limb, or organ.   Patient requires inpatient status due to high intensity of service, high risk for further deterioration and high frequency of surveillance required because of this acute illness that poses a threat to life, limb or bodily function.  Factors to support inpatient status include advanced age, dementia, insulin-dependent diabetes, and urinary tract infection with evidence of end organ failure and hyperglycemia  I certify that at the point of admission it is my clinical judgment that the patient will require inpatient hospital care spanning beyond 2 midnights from the point of admission and that early discharge would result in unnecessary risk of decompensation and readmission or threat to life, limb or bodily function.

## 2016-08-11 NOTE — Clinical Social Work Note (Signed)
Clinical Social Work Assessment  Patient Details  Name: [name] MRN: [id number] Date of Birth: 1940/03/27  Date of referral:  08/11/16               Reason for consult:  Facility Placement, Discharge Planning                Permission sought to share information with:  Facility Art therapist granted to share information::  Yes, Verbal Permission Granted  Name::        Agency::     Relationship::     Contact Information:     Housing/Transportation Living arrangements for the past 2 months:  [place] of Information:  Adult Children, Facility Patient Interpreter Needed:  None Criminal Activity/Legal Involvement Pertinent to Current Situation/Hospitalization:  No - Comment as needed Significant Relationships:  Adult Children Lives with:  Facility Resident Do you feel safe going back to the place where you live?  Yes Need for family participation in patient care:  Yes (Comment)  Care giving concerns:  No concerns reported at this time.   Social Worker assessment / plan: Pt hospitalized from [facility] on 08/10/16 with Sepsis. PN reviewed. Pt is only oriented x1 and unable to participate in d/c planning. CSW spoke with pt's daughter [name] [phone] to assist with d/c planning. Daughter reports that she is holding bed for pt to return to [facility] at d/c. CSW has contacted SNF, clinicals sent for review and d/c plan confirmed. CSW will continue to follow to assist with d/c planning.  Employment status:  Retired Forensic scientist:  Medicare PT Recommendations:  Not assessed at this time Information / Referral to community resources:     Patient/Family's Response to care:  Daughter plans for pt to return to SNF at d/c.  Patient/Family's Understanding of and Emotional Response to Diagnosis, Current Treatment, and Prognosis:  Daughter is aware of pt's medical status. She is hopeful pt will improve quickly and  return to SNF. Daughter appreciates CSW assistance with d/c planning.  Emotional Assessment Appearance:  Appears stated age Attitude/Demeanor/Rapport:  Other (cooperative) Affect (typically observed):  Calm, Appropriate Orientation:  Oriented to Self Alcohol / Substance use:  Not Applicable Psych involvement (Current and /or in the community):  No (Comment)  Discharge Needs  Concerns to be addressed:  Discharge Planning Concerns Readmission within the last 30 days:  No Current discharge risk:  None Barriers to Discharge:  No Barriers Identified   [name]  [phone] 08/11/2016, 3:40 PM

## 2016-08-11 NOTE — Consult Note (Signed)
Reason for Consult: possible colo Referring Physician: Wynelle Cleveland, S CC; Altered mental status, lethargic, Non ambulatory secondary to hip fx and knee replacement.  [name] is an 76 y.o. female.  HPI: Pt with multiple medical issues, fell in 5/14 from a wheelchair  and had a right femur fracture, repaired by Dr. Maureen Ralphs on 07/06/16, and discharged 07/08/16, but brought back to the ED with shortness of breath, hallucinations, and anemia.  She was transfused and stabilized.  She went back to the SNF on 07/10/16  Now she returns to the ED from [facility].  With AMS, and lethargy, some reported abdominal pain, nausea, vomiting and diarrhea also reported.  Exam in the ED shows moderate loose stool in the rectal vault, Vagina with mild erythema on Exam by ED staff.   No report of stool like drainage from vaginal or in urine  Work up shows:  She was afebrile on admit and since admission.  BP is up but heart rate is in the 80-90's range, Sats are 92-98% on room air. CMP shows her glucose is 243, Creatinine is 1.14, protein is down, but no real significant changes.  WBC is 13.1, H/G 14/42, platelets are normal. Lactate 2.35.  UA show Negative nitrates abut many bacteria and TNTC WBC's. Repeat CMP this AM shows glucose up to 328.   CXR:  Shallow lung inflation with patchy bibasilar opacities, left greater than right. Atelectasis/bronchovascular crowding is favored, although superimposed infiltrates could be considered   CT of the head:  1. No acute intracranial hemorrhage. 2. Moderate age-related atrophy and chronic microvascular ischemic changes   CT abdomen: . Rectal distention with wall thickening and surrounding soft tissue edema suspicious for fecal impaction. Colonic tortuosity with moderate colonic stool burden, likely chronic constipation. No bowel obstruction.  Bladder wall thickening with mild perivesicular edema. This is likely secondary to cystitis. There is air in the urinary bladder,  which may be iatrogenic, recommend correlation for catheterization attempts. Alternatively, emphysematous cystitis is considered, however no air in the bladder wall. Colovesicular fistula is not excluded.    Post hysterectomy. 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  We are ask to see and help with sorting out this diagnosis. She has received Ceftriaxone/Zosyn and Vancomycin so far.   Urine and blood cultures are pending.  Pt has significant dementia so history from her is not possible and all the above history is from the family.  Anti-infectives    Start     Dose/Rate Route Frequency Ordered Stop   08/11/16 0130  cefTRIAXone (ROCEPHIN) 2 g in dextrose 5 % 50 mL IVPB     2 g 100 mL/hr over 30 Minutes Intravenous Daily at bedtime 08/11/16 0124     08/10/16 2330  vancomycin (VANCOCIN) IVPB 1000 mg/200 mL premix     1,000 mg 200 mL/hr over 60 Minutes Intravenous  Once 08/10/16 2327 08/11/16 0105   08/10/16 2330  piperacillin-tazobactam (ZOSYN) IVPB 3.375 g     3.375 g 100 mL/hr over 30 Minutes Intravenous  Once 08/10/16 2327 08/11/16 0103       Past Medical History:  Diagnosis Date  . Anxiety   . Depression   . Diabetes mellitus   . Dyspnea   . GERD (gastroesophageal reflux disease)   . Headache(784.0)   . HTN (hypertension)    dr Johnsie Cancel  . Hypercholesteremia    DENIES  . Osteoarthritis   . PONV (postoperative nausea and vomiting)   . RLS (restless  legs syndrome)   . Stress     Past Surgical History:  Procedure Laterality Date  . ABDOMINAL HYSTERECTOMY    . COLONOSCOPY    . JOINT REPLACEMENT    . left forearm fracture with ORIF Left   . LUMBAR LAMINECTOMY/DECOMPRESSION MICRODISCECTOMY N/A 04/18/2012   Procedure: LUMBAR LAMINECTOMY/DECOMPRESSION MICRODISCECTOMY 3 LEVELS;  Surgeon: Winfield Cunas, MD;  Location: [facility];  Service: Neurosurgery;  Laterality: N/A;  Lumbar three-four, lumbar four-five, lumbar five-sacral one decompression.  Synovial cyst resection lumbar four-five  . ORIF PERIPROSTHETIC FRACTURE Right 07/06/2016   Procedure: OPEN REDUCTION INTERNAL FIXATION (ORIF) PERIPROSTHETIC FRACTURE;  Surgeon: Gaynelle Arabian, MD;  Location: [facility];  Service: Orthopedics;  Laterality: Right;  . REPLACEMENT TOTAL KNEE Right   . WISDOM TOOTH EXTRACTION      Family History  Problem Relation Age of Onset  . Diabetes Father   . High blood pressure Father   . High blood pressure Mother   . Colon cancer Neg Hx   . Esophageal cancer Neg Hx   . Liver disease Neg Hx   . Stomach cancer Neg Hx   . Pancreatic cancer Neg Hx     Social History:  reports that she has never smoked. She has never used smokeless tobacco. She reports that she does not drink alcohol or use drugs.  Allergies:  Allergies  Allergen Reactions  . Morphine And Related Other (See Comments)    Reaction:  Hallucinations   . Oxycodone Other (See Comments)    Reaction:  Hallucinations  . Sulfonamide Derivatives Swelling and Other (See Comments)    Reaction:  Unspecified swelling reaction     Medications:  Prior to Admission:  Prescriptions Prior to Admission  Medication Sig Dispense Refill Last Dose  . acetaminophen (TYLENOL) 325 MG tablet Take 650 mg by mouth 2 (two) times daily.   08/10/2016 at 1700  . acetaminophen (TYLENOL) 500 MG tablet Take 500 mg by mouth every 8 (eight) hours as needed for mild pain.   Past Month at Unknown time  . ALPRAZolam (XANAX) 0.25 MG tablet Take 1 tablet (0.25 mg total) by mouth 2 (two) times daily as needed for anxiety. (Patient taking differently: Take 0.25 mg by mouth 2 (two) times daily. ) 5 tablet 0 08/10/2016 at Unknown time  . amLODipine (NORVASC) 5 MG tablet Take 1 tablet (5 mg total) by mouth daily.   08/10/2016 at Unknown time  . aspirin 81 MG chewable tablet Chew 81 mg by mouth daily.   08/10/2016 at 0900  . bisacodyl (DULCOLAX) 10 MG suppository Place 1 suppository (10 mg total) rectally daily as needed for moderate  constipation. 12 suppository 0 Past Month at Unknown time  . docusate sodium (COLACE) 100 MG capsule Take 100 mg by mouth 2 (two) times daily.   08/10/2016 at Unknown time  . DULoxetine (CYMBALTA) 30 MG capsule Take 30 mg by mouth 2 (two) times daily.   08/10/2016 at Unknown time  . insulin aspart (NOVOLOG) 100 UNIT/ML injection Inject 2-11 Units into the skin 3 (three) times daily before meals. Pt uses per sliding scale:    101-150:  2 units 151-200:  3 units  201-250:  5 units  251-300:  7 units  301-350:  9 units  Greater than 350:  11 units Greater than 400 or less than 60:  Call MD   08/10/2016 at Unknown time  . Insulin Detemir (LEVEMIR FLEXTOUCH) 100 UNIT/ML Pen Inject 8-12 Units into the skin 2 (two) times  daily. Pt uses 12 units in the morning and 8 units at bedtime.   08/10/2016 at Unknown time  . loratadine (CLARITIN) 10 MG tablet Take 10 mg by mouth daily.    08/10/2016 at Unknown time  . losartan (COZAAR) 100 MG tablet Take 100 mg by mouth every evening.    08/09/2016 at Unknown time  . Magnesium Oxide 500 MG TABS Take 500 mg by mouth daily.    08/10/2016 at Unknown time  . methocarbamol (ROBAXIN) 500 MG tablet Take 1 tablet (500 mg total) by mouth every 6 (six) hours as needed for muscle spasms. 80 tablet 0 Past Week at Unknown time  . metoprolol succinate (TOPROL-XL) 50 MG 24 hr tablet Take 1 tablet (50 mg total) by mouth daily. 30 tablet 0 08/10/2016 at 0900  . Nutritional Supplements (NUTRITIONAL SUPPLEMENT PLUS) LIQD Take 120 mLs by mouth 2 (two) times daily. Med Pass   08/10/2016 at Unknown time  . omeprazole (PRILOSEC) 40 MG capsule Take 40 mg by mouth daily.   08/10/2016 at Unknown time  . polyethylene glycol (MIRALAX / GLYCOLAX) packet Take 17 g by mouth daily. 14 each 0 08/10/2016 at Unknown time  . potassium bicarbonate (K-LYTE) 25 MEQ disintegrating tablet Take 25 mEq by mouth daily.   08/10/2016 at Unknown time  . pramipexole (MIRAPEX) 0.25 MG tablet Take 1.5 mg by mouth 3 (three)  times daily.    08/10/2016 at Unknown time  . traMADol (ULTRAM) 50 MG tablet Take 50 mg by mouth every 6 (six) hours as needed for moderate pain.   Past Week at Unknown time   Scheduled: . aspirin  81 mg Oral Daily  . docusate sodium  100 mg Oral BID  . DULoxetine  30 mg Oral BID  . enoxaparin (LOVENOX) injection  40 mg Subcutaneous Q24H  . feeding supplement (ENSURE ENLIVE)  120 mL Oral BID  . insulin aspart  0-15 Units Subcutaneous TID WC  . insulin aspart  0-5 Units Subcutaneous QHS  . insulin detemir  12 Units Subcutaneous Daily  . insulin detemir  8 Units Subcutaneous QHS  . metoprolol succinate  50 mg Oral Daily  . polyethylene glycol  17 g Oral Daily  . pramipexole  1.5 mg Oral TID   Continuous: . sodium chloride 100 mL/hr at 08/11/16 1145  . cefTRIAXone (ROCEPHIN)  IV Stopped (08/11/16 0556)    Results for orders placed or performed during the hospital encounter of 08/10/16 (from the past 48 hour(s))  CBG monitoring, ED     Status: Abnormal   Collection Time: 08/10/16  9:38 PM  Result Value Ref Range   Glucose-Capillary 250 (H) 65 - 99 mg/dL   Comment 1 Notify RN   Comprehensive metabolic panel     Status: Abnormal   Collection Time: 08/10/16  9:58 PM  Result Value Ref Range   Sodium 141 135 - 145 mmol/L   Potassium 4.1 3.5 - 5.1 mmol/L   Chloride 104 101 - 111 mmol/L   CO2 25 22 - 32 mmol/L   Glucose, Bld 243 (H) 65 - 99 mg/dL   BUN 31 (H) 6 - 20 mg/dL   Creatinine, Ser 1.14 (H) 0.44 - 1.00 mg/dL   Calcium 10.2 8.9 - 10.3 mg/dL   Total Protein 8.2 (H) 6.5 - 8.1 g/dL   Albumin 4.0 3.5 - 5.0 g/dL   AST 17 15 - 41 U/L   ALT 13 (L) 14 - 54 U/L   Alkaline Phosphatase 118 38 - 126  U/L   Total Bilirubin 0.8 0.3 - 1.2 mg/dL   GFR calc non Af Amer 45 (L) >60 mL/min   GFR calc Af Amer 52 (L) >60 mL/min    Comment: (NOTE) The eGFR has been calculated using the CKD EPI equation. This calculation has not been validated in all clinical situations. eGFR's persistently <60  mL/min signify possible Chronic Kidney Disease.    Anion gap 12 5 - 15  CBC     Status: Abnormal   Collection Time: 08/10/16  9:58 PM  Result Value Ref Range   WBC 13.1 (H) 4.0 - 10.5 K/uL   RBC 5.11 3.87 - 5.11 MIL/uL   Hemoglobin 14.0 12.0 - 15.0 g/dL   HCT 42.5 36.0 - 46.0 %   MCV 83.2 78.0 - 100.0 fL   MCH 27.4 26.0 - 34.0 pg   MCHC 32.9 30.0 - 36.0 g/dL   RDW 15.1 11.5 - 15.5 %   Platelets 362 150 - 400 K/uL  Lipase, blood     Status: None   Collection Time: 08/10/16  9:58 PM  Result Value Ref Range   Lipase 24 11 - 51 U/L  I-Stat CG4 Lactic Acid, ED     Status: Abnormal   Collection Time: 08/10/16 10:24 PM  Result Value Ref Range   Lactic Acid, Venous 2.35 (HH) 0.5 - 1.9 mmol/L   Comment NOTIFIED PHYSICIAN   Blood gas, venous     Status: Abnormal   Collection Time: 08/10/16 10:59 PM  Result Value Ref Range   FIO2 21.00    pH, Ven 7.403 7.250 - 7.430   pCO2, Ven 43.0 (L) 44.0 - 60.0 mmHg   pO2, Ven 58.5 (H) 32.0 - 45.0 mmHg   Bicarbonate 26.3 20.0 - 28.0 mmol/L   Acid-Base Excess 1.7 0.0 - 2.0 mmol/L   O2 Saturation 87.9 %   Patient temperature 98.6   Urinalysis, Routine w reflex microscopic     Status: Abnormal   Collection Time: 08/11/16 12:12 AM  Result Value Ref Range   Color, Urine AMBER (A) YELLOW    Comment: BIOCHEMICALS MAY BE AFFECTED BY COLOR   APPearance CLOUDY (A) CLEAR   Specific Gravity, Urine >1.046 (H) 1.005 - 1.030   pH 5.0 5.0 - 8.0   Glucose, UA >=500 (A) NEGATIVE mg/dL   Hgb urine dipstick SMALL (A) NEGATIVE   Bilirubin Urine NEGATIVE NEGATIVE   Ketones, ur NEGATIVE NEGATIVE mg/dL   Protein, ur >=300 (A) NEGATIVE mg/dL   Nitrite NEGATIVE NEGATIVE   Leukocytes, UA LARGE (A) NEGATIVE   RBC / HPF 6-30 0 - 5 RBC/hpf   WBC, UA TOO NUMEROUS TO COUNT 0 - 5 WBC/hpf   Bacteria, UA MANY (A) NONE SEEN   Squamous Epithelial / LPF 6-30 (A) NONE SEEN   WBC Clumps PRESENT   Glucose, capillary     Status: Abnormal   Collection Time: 08/11/16  3:07 AM   Result Value Ref Range   Glucose-Capillary 298 (H) 65 - 99 mg/dL   Comment 1 Notify RN   Lactic acid, plasma     Status: None   Collection Time: 08/11/16  4:54 AM  Result Value Ref Range   Lactic Acid, Venous 1.1 0.5 - 1.9 mmol/L  Comprehensive metabolic panel     Status: Abnormal   Collection Time: 08/11/16  4:54 AM  Result Value Ref Range   Sodium 142 135 - 145 mmol/L   Potassium 4.3 3.5 - 5.1 mmol/L   Chloride 108 101 -  111 mmol/L   CO2 24 22 - 32 mmol/L   Glucose, Bld 328 (H) 65 - 99 mg/dL   BUN 31 (H) 6 - 20 mg/dL   Creatinine, Ser 1.19 (H) 0.44 - 1.00 mg/dL   Calcium 9.5 8.9 - 10.3 mg/dL   Total Protein 7.3 6.5 - 8.1 g/dL   Albumin 3.6 3.5 - 5.0 g/dL   AST 16 15 - 41 U/L   ALT 12 (L) 14 - 54 U/L   Alkaline Phosphatase 104 38 - 126 U/L   Total Bilirubin 1.0 0.3 - 1.2 mg/dL   GFR calc non Af Amer 43 (L) >60 mL/min   GFR calc Af Amer 50 (L) >60 mL/min    Comment: (NOTE) The eGFR has been calculated using the CKD EPI equation. This calculation has not been validated in all clinical situations. eGFR's persistently <60 mL/min signify possible Chronic Kidney Disease.    Anion gap 10 5 - 15  CBC     Status: Abnormal   Collection Time: 08/11/16  4:54 AM  Result Value Ref Range   WBC 12.1 (H) 4.0 - 10.5 K/uL   RBC 4.60 3.87 - 5.11 MIL/uL   Hemoglobin 12.5 12.0 - 15.0 g/dL   HCT 38.6 36.0 - 46.0 %   MCV 83.9 78.0 - 100.0 fL   MCH 27.2 26.0 - 34.0 pg   MCHC 32.4 30.0 - 36.0 g/dL   RDW 15.3 11.5 - 15.5 %   Platelets 284 150 - 400 K/uL  MRSA PCR Screening     Status: None   Collection Time: 08/11/16  7:30 AM  Result Value Ref Range   MRSA by PCR NEGATIVE NEGATIVE    Comment:        The GeneXpert MRSA Assay (FDA approved for NASAL specimens only), is one component of a comprehensive MRSA colonization surveillance program. It is not intended to diagnose MRSA infection nor to guide or monitor treatment for MRSA infections.   Glucose, capillary     Status: Abnormal    Collection Time: 08/11/16  7:52 AM  Result Value Ref Range   Glucose-Capillary 347 (H) 65 - 99 mg/dL  Lactic acid, plasma     Status: None   Collection Time: 08/11/16  8:22 AM  Result Value Ref Range   Lactic Acid, Venous 1.2 0.5 - 1.9 mmol/L    Ct Head Wo Contrast  Result Date: 08/10/2016 CLINICAL DATA:  76 year old female with altered mental status. EXAM: CT HEAD WITHOUT CONTRAST TECHNIQUE: Contiguous axial images were obtained from the base of the skull through the vertex without intravenous contrast. COMPARISON:  Brain MRI dated 07/11/2014 FINDINGS: Brain: There is moderate age-related atrophy and chronic microvascular ischemic changes. There is dilatation of the ventricles out of proportion with the sulci which may represent central volume loss versus normal pressure hydrocephalus. Clinical correlation is recommended. There is no acute intracranial hemorrhage. No mass effect or midline shift noted. No extra-axial fluid collection. Vascular: No hyperdense vessel or unexpected calcification. Skull: Normal. Negative for fracture or focal lesion. Sinuses/Orbits: No acute finding. Other: None. IMPRESSION: 1. No acute intracranial hemorrhage. 2. Moderate age-related atrophy and chronic microvascular ischemic changes. If symptoms persist, and there are no contraindications, MRI may provide better evaluation if clinically indicated. Electronically Signed   By: Anner Crete M.D.   On: 08/10/2016 23:50   Ct Abdomen Pelvis W Contrast  Result Date: 08/11/2016 CLINICAL DATA:  Abdominal pain, vomiting and diarrhea. EXAM: CT ABDOMEN AND PELVIS WITH CONTRAST TECHNIQUE: Multidetector CT  imaging of the abdomen and pelvis was performed using the standard protocol following bolus administration of intravenous contrast. CONTRAST:  149m ISOVUE-300 IOPAMIDOL (ISOVUE-300) INJECTION 61% COMPARISON:  CT abdomen 07/30/2015.  CTA 09/15/2010 FINDINGS: Lower chest: Mild atelectasis in both lower lobes. No pleural  effusion. Hepatobiliary: No focal hepatic lesion. Mild gallbladder distention with layering hyperdensity, stones versus sludge. No evidence of pericholecystic inflammation. No biliary dilatation. Pancreas: No ductal dilatation or inflammation. Spleen: Small enhancing splenic lesions are again seen, some which are not as well visualized on the current exam. No new focal abnormality. Adrenals/Urinary Tract: Unchanged 2 cm left adrenal nodule. The right adrenal gland is normal. No hydronephrosis or perinephric edema. Small cortical hypodensity in the upper left kidney is unchanged from prior. Subcentimeter right renal cortical hypodensities are too small to characterize. There is delayed renal excretion bilaterally urinary bladder is minimally distended with wall thickening. There is air in the bladder. Mild perivesicular soft tissue stranding. Stomach/Bowel: The rectum is distended with stool spanning 7.6 cm. There is rectal wall thickening and surrounding soft tissue edema. Diffuse colonic tortuosity. Small to moderate stool in the more proximal colon. No additional colonic wall thickening. No small bowel dilatation or inflammation. The stomach is decompressed. The appendix is not confidently visualized, no right lower quadrant or pericecal inflammation. Vascular/Lymphatic: Normal caliber abdominal aorta. No abdominal or pelvic adenopathy. Reproductive: Post hysterectomy. There is air in the vagina. No adnexal mass. Other: No ascites or free air. Laxity of the anterior abdominal wall without frank hernia. Musculoskeletal: Chronic L3 compression fracture with augmentation. Chronic prominent Schmorl's nodes within superior and inferior endplates of L1. Multilevel degenerative change throughout the lumbar spine. Intramedullary rod in the right femur is partially included. IMPRESSION: 1. Rectal distention with wall thickening and surrounding soft tissue edema suspicious for fecal impaction. Colonic tortuosity with  moderate colonic stool burden, likely chronic constipation. No bowel obstruction. 2. Bladder wall thickening with mild perivesicular edema. This is likely secondary to cystitis. There is air in the urinary bladder, which may be iatrogenic, recommend correlation for catheterization attempts. Alternatively, emphysematous cystitis is considered, however no air in the bladder wall. Colovesicular fistula is not excluded. 3. Post hysterectomy. 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 4. Stones or sludge in the gallbladder without CT findings of cholecystitis. 5. Hyperenhancing splenic lesions are again seen, not as well seen compared to prior exam, possibly due to phase contrast. These are stable compared to 2012 CT suggesting hemangiomas, but nonspecific. 6. Stable left adrenal adenoma. Electronically Signed   By: MJeb LeveringM.D.   On: 08/11/2016 00:07   Dg Chest Portable 1 View  Result Date: 08/10/2016 CLINICAL DATA:  Initial evaluation for acute altered mental status. EXAM: PORTABLE CHEST 1 VIEW COMPARISON:  The prior radiograph from 07/09/2016 FINDINGS: Mild cardiomegaly, stable. Mediastinal silhouette within normal limits. Lungs are hypoinflated. Patchy bibasilar opacities favored to reflect atelectasis/bronchovascular crowding, although superimposed infiltrates could be considered in the correct clinical setting. No pulmonary edema or pleural effusion. No pneumothorax. No acute osseous abnormality. IMPRESSION: Shallow lung inflation with patchy bibasilar opacities, left greater than right. Atelectasis/bronchovascular crowding is favored, although superimposed infiltrates could be considered in the correct clinical setting. Electronically Signed   By: BJeannine BogaM.D.   On: 08/10/2016 23:04    Review of Systems  Unable to perform ROS: Dementia   Blood pressure (!) 166/105, pulse 87, temperature 98.7 F (37.1 C), temperature source Oral, resp. rate 20, SpO2 98  %.  Physical Exam  Constitutional:  Sweet, pleasant woman, with dementia, can remember some things, but has limited memory. She focuses on being good, and not falling.  She denies pain currently or on exam. She is in no distress, and is not toxic in appearance.  HENT:  Head: Normocephalic and atraumatic.  Mouth/Throat: No oropharyngeal exudate.  Eyes: Right eye exhibits no discharge. Left eye exhibits no discharge. No scleral icterus.  Pupils are equal  Neck: Normal range of motion. Neck supple. No JVD present. No tracheal deviation present. No thyromegaly present.  Cardiovascular: Normal rate, regular rhythm, normal heart sounds and intact distal pulses.   No murmur heard. Respiratory: Effort normal and breath sounds normal. No respiratory distress. She has no wheezes. She has no rales. She exhibits no tenderness.  GI: Soft. Bowel sounds are normal. She exhibits no distension and no mass. There is no tenderness. There is no rebound and no guarding.  Musculoskeletal: She exhibits no edema or tenderness.  Suture sites and port sites right knee and leg  Lymphadenopathy:    She has no cervical adenopathy.  Neurological: She is alert. No cranial nerve deficit.  Skin: Skin is warm and dry. No rash noted. No erythema. No pallor.  Psychiatric:  She cannot remember much.  Becomes a little anxious with questioning.      Assessment/Plan: Sepsis Dehydration Bladder wall thickening with air in and outside bladder wall border - she did have a foley for a time Concern for possible colovesicular/colovaginal fistula - she is asymptomatic currently Constipation with probable fecal impaction Dementia/with AMS Type II diabetes - poor control currently Hypertension Bed/chair bound with recent Knee replacement, then fall with right femur fx Malnutrition/Deconditioning  Plan:  Dr. Wynelle Cleveland is going to work on the constipation and possible fecal impaction.  She is on wide spectrum antibiotics.  Currently  she has no pain and no reports of stool with urine, or from the vagina.    Will review with Dr. Lucia Gaskins and follow with you.  Blood and urine cultures are pending.     JENNINGS,WILLARD 08/11/2016, 10:58 AM   Agree with above.  Not sure what is going on.  The patient cannot participate in her care. Appears to be impacted on CT scan.  Her daughter, [name], is at the bedside.  Her daughter has some unrealistic expectations.  The patient comes from [facility].  Alphonsa Overall, MD, [facility] Pager: [phone] Office phone:  [phone]

## 2016-08-11 NOTE — Progress Notes (Signed)
Pharmacy Antibiotic Note  [name] is a 76 y.o. female admitted on 08/10/2016 with sepsis and UTI.  Pharmacy has been consulted for Vancomycin and Zosyn and ceftriaxone dosing.  Plan: Vancomycin 1gm iv x1, then MD discontinued  Zosyn 3.375gm iv x1, then MD discontinued   Ceftriaxone 2gm iv q24hr then pharmacy renal dosing sign off     Temp (24hrs), Avg:98.4 F (36.9 C), Min:98.4 F (36.9 C), Max:98.4 F (36.9 C)   Recent Labs Lab 08/10/16 2158 08/10/16 2224  WBC 13.1*  --   CREATININE 1.14*  --   LATICACIDVEN  --  2.35*    CrCl cannot be calculated (Unknown ideal weight.).    Allergies  Allergen Reactions  . Morphine And Related Other (See Comments)    Reaction:  Hallucinations   . Oxycodone Other (See Comments)    Reaction:  Hallucinations  . Sulfonamide Derivatives Swelling and Other (See Comments)    Reaction:  Unspecified swelling reaction     Antimicrobials this admission: Vancomycin 08/10/2016 x1 Zosyn 08/10/2016 x1 Ceftriaxone 6/21 >>   Dose adjustments this admission: -  Microbiology results: pending  Thank you for allowing pharmacy to be a part of this patient's care.  [name] 08/11/2016 1:26 AM

## 2016-08-11 NOTE — Progress Notes (Signed)
08/11/16  1130  Checked patient for impaction. No impaction felt. Soft brown stool on glove.

## 2016-08-12 DIAGNOSIS — N390 Urinary tract infection, site not specified: Secondary | ICD-10-CM

## 2016-08-12 DIAGNOSIS — E1100 Type 2 diabetes mellitus with hyperosmolarity without nonketotic hyperglycemic-hyperosmolar coma (NKHHC): Secondary | ICD-10-CM

## 2016-08-12 DIAGNOSIS — R410 Disorientation, unspecified: Secondary | ICD-10-CM

## 2016-08-12 DIAGNOSIS — K5909 Other constipation: Secondary | ICD-10-CM

## 2016-08-12 DIAGNOSIS — G9340 Encephalopathy, unspecified: Secondary | ICD-10-CM

## 2016-08-12 DIAGNOSIS — F0150 Vascular dementia without behavioral disturbance: Secondary | ICD-10-CM

## 2016-08-12 LAB — BLOOD CULTURE ID PANEL (REFLEXED)
Acinetobacter baumannii: NOT DETECTED
CANDIDA KRUSEI: NOT DETECTED
CANDIDA PARAPSILOSIS: NOT DETECTED
Candida albicans: NOT DETECTED
Candida glabrata: NOT DETECTED
Candida tropicalis: NOT DETECTED
ESCHERICHIA COLI: NOT DETECTED
Enterobacter cloacae complex: NOT DETECTED
Enterobacteriaceae species: NOT DETECTED
Enterococcus species: NOT DETECTED
Haemophilus influenzae: NOT DETECTED
KLEBSIELLA OXYTOCA: NOT DETECTED
Klebsiella pneumoniae: NOT DETECTED
Listeria monocytogenes: NOT DETECTED
Methicillin resistance: DETECTED — AB
Neisseria meningitidis: NOT DETECTED
PROTEUS SPECIES: NOT DETECTED
Pseudomonas aeruginosa: NOT DETECTED
SERRATIA MARCESCENS: NOT DETECTED
STAPHYLOCOCCUS AUREUS BCID: NOT DETECTED
STAPHYLOCOCCUS SPECIES: DETECTED — AB
Streptococcus agalactiae: NOT DETECTED
Streptococcus pneumoniae: NOT DETECTED
Streptococcus pyogenes: NOT DETECTED
Streptococcus species: NOT DETECTED

## 2016-08-12 LAB — CBC
HEMATOCRIT: 34.6 % — AB (ref 36.0–46.0)
HEMOGLOBIN: 11 g/dL — AB (ref 12.0–15.0)
MCH: 26.9 pg (ref 26.0–34.0)
MCHC: 31.8 g/dL (ref 30.0–36.0)
MCV: 84.6 fL (ref 78.0–100.0)
Platelets: 259 10*3/uL (ref 150–400)
RBC: 4.09 MIL/uL (ref 3.87–5.11)
RDW: 15 % (ref 11.5–15.5)
WBC: 7.1 10*3/uL (ref 4.0–10.5)

## 2016-08-12 LAB — BASIC METABOLIC PANEL
ANION GAP: 11 (ref 5–15)
BUN: 27 mg/dL — ABNORMAL HIGH (ref 6–20)
CALCIUM: 9.5 mg/dL (ref 8.9–10.3)
CO2: 25 mmol/L (ref 22–32)
Chloride: 110 mmol/L (ref 101–111)
Creatinine, Ser: 0.81 mg/dL (ref 0.44–1.00)
GFR calc Af Amer: >60 mL/min (ref >60)
GFR calc non Af Amer: >60 mL/min (ref >60)
GLUCOSE: 252 mg/dL — AB (ref 65–99)
POTASSIUM: 3.3 mmol/L — AB (ref 3.5–5.1)
Sodium: 146 mmol/L — ABNORMAL HIGH (ref 135–145)

## 2016-08-12 LAB — GLUCOSE, CAPILLARY
GLUCOSE-CAPILLARY: 302 mg/dL — AB (ref 65–99)
Glucose-Capillary: 231 mg/dL — ABNORMAL HIGH (ref 65–99)
Glucose-Capillary: 321 mg/dL — ABNORMAL HIGH (ref 65–99)
Glucose-Capillary: 278 mg/dL — ABNORMAL HIGH (ref 65–99)

## 2016-08-12 MED ORDER — HYDRALAZINE HCL 20 MG/ML IJ SOLN
10.0000 mg | INTRAMUSCULAR | Status: DC | PRN
  Administered 2016-08-12: 07:00:00 10 mg via INTRAVENOUS
  Filled 2016-08-12 (×2): qty 0.5

## 2016-08-12 MED ORDER — POTASSIUM CHLORIDE CRYS ER 20 MEQ PO TBCR
40.0000 meq | EXTENDED_RELEASE_TABLET | Freq: Once | ORAL | Status: AC
  Administered 2016-08-12: 40 meq via ORAL
  Filled 2016-08-12: qty 2

## 2016-08-12 MED ORDER — SORBITOL 70 % SOLN
960.0000 mL | TOPICAL_OIL | Freq: Once | ORAL | Status: DC
  Filled 2016-08-12: qty 240

## 2016-08-12 MED ORDER — AMLODIPINE BESYLATE 10 MG PO TABS
10.0000 mg | ORAL_TABLET | Freq: Every day | ORAL | Status: DC
  Administered 2016-08-12 – 2016-08-13 (×2): 10 mg via ORAL
  Filled 2016-08-12 (×2): qty 1

## 2016-08-12 MED ORDER — DEXTROSE 5 % IV SOLN
INTRAVENOUS | Status: AC
  Administered 2016-08-12: 12:00:00 via INTRAVENOUS

## 2016-08-12 NOTE — Progress Notes (Signed)
Dr. Hal Hope paged to notify of elevated BP. Received order for IV Hydralazine (10mg ) every 4hours for SBP >160, see orders.

## 2016-08-12 NOTE — Progress Notes (Signed)
PROGRESS NOTE    INFANTOF VILLAGOMEZ   BWG:[id number]  DOB: Mar 13, 1940  DOA: 08/10/2016 PCP: [name], MD   Brief Narrative:  [name] is a 76 y.o. 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 Also noted to have rectal fecal impaction on CT.    Subjective:  Patient in bed, appears comfortable, denies any headache, no fever, no chest pain or pressure, no shortness of breath , no abdominal pain. No focal weakness.  Assessment & Plan:   Severe sepsis due to UTI, massive stool bordering on CT scan -  CT findings discussed with general surgeon on call Dr. Lucia Gaskins, he recommends continue treating UTI, he is not convinced that patient truly has a fistula. There is massive stool burden for which disimpaction was attempted and failed will give patient SM OG enema. Monitor. Continue to treat UTI with Rocephin and follow cultures. General surgery on board. Sepsis physiology has completely resolved.  ARF due to dehydration with hypernatremia. Resolved with IV fluids. Has mild hyponatremia for which she will get gentle D5W for 10 hours.  Acute encephalopathy superimposed on Dementia  - due to UTI and being in the hospital setting, CT head nonacute, no focal deficits, continue supportive care, minimize narcotics and benzodiazepines, remains at risk for being in delirium while in the hospital setting, daughter explained clearly  Essential hypertension - in poor control on Lopressor, added Norvasc, will monitor.  DM type 2 (diabetes mellitus, type 2)  - on Levemir and SSI  Lab Results  Component Value Date   HGBA1C 9.1 (H) 07/10/2014   CBG (last 3)   Recent Labs  08/11/16 1729 08/11/16 2141 08/12/16 0723  GLUCAP 112* 156* 231*      DVT prophylaxis: Lovenox Code Status: DNR Family  Communication: daughter at bedside  Disposition Plan: return to [facility] when stable Consultants:  [facility] Procedures:   CT head, CT abdomen and pelvis. Nonacute, possible stool bordering on CT abdomen and pelvis, other nonspecific findings discussed with general surgery.   Antimicrobials:  Anti-infectives    Start     Dose/Rate Route Frequency Ordered Stop   08/11/16 0130  cefTRIAXone (ROCEPHIN) 2 g in dextrose 5 % 50 mL IVPB     2 g 100 mL/hr over 30 Minutes Intravenous Daily at bedtime 08/11/16 0124     08/10/16 2330  vancomycin (VANCOCIN) IVPB 1000 mg/200 mL premix     1,000 mg 200 mL/hr over 60 Minutes Intravenous  Once 08/10/16 2327 08/11/16 0105   08/10/16 2330  piperacillin-tazobactam (ZOSYN) IVPB 3.375 g     3.375 g 100 mL/hr over 30 Minutes Intravenous  Once 08/10/16 2327 08/11/16 0103       Objective: Vitals:   08/11/16 1559 08/11/16 2102 08/11/16 2350 08/12/16 0630  BP:  (!) 186/88 (!) 158/101 (!) 185/88  Pulse:  89  73  Resp:  16  16  Temp:  98.5 F (36.9 C)  98.1 F (36.7 C)  TempSrc:  Oral  Oral  SpO2:  97%  98%  Weight: 91.8 kg (202 lb 4.8 oz)       Intake/Output Summary (Last 24 hours) at 08/12/16 1103 Last data filed at 08/12/16 0843  Gross per 24 hour  Intake             2420 ml  Output                0 ml  Net             2420 ml   Filed Weights   08/11/16 1559  Weight: 91.8 kg (202 lb 4.8 oz)    Examination:  Awake , Pleasantly confused, No new F.N deficits, Normal affect [place].AT,PERRAL Supple Neck,No JVD, No cervical lymphadenopathy appriciated.  Symmetrical Chest wall movement, Good air movement bilaterally, CTAB RRR,No Gallops,Rubs or new Murmurs, No Parasternal Heave +ve B.Sounds, Abd Soft, No tenderness, No organomegaly appriciated, No rebound - guarding or rigidity. No Cyanosis, Clubbing or edema, No new Rash or bruise   Data Reviewed: I have personally reviewed following labs and imaging studies  CBC:  Recent Labs Lab  08/10/16 2158 08/11/16 0454 08/12/16 0456  WBC 13.1* 12.1* 7.1  HGB 14.0 12.5 11.0*  HCT 42.5 38.6 34.6*  MCV 83.2 83.9 84.6  PLT 362 284 814   Basic Metabolic Panel:  Recent Labs Lab 08/10/16 2158 08/11/16 0454 08/12/16 0456  NA 141 142 146*  K 4.1 4.3 3.3*  CL 104 108 110  CO2 25 24 25   GLUCOSE 243* 328* 252*  BUN 31* 31* 27*  CREATININE 1.14* 1.19* 0.81  CALCIUM 10.2 9.5 9.5   GFR: Estimated Creatinine Clearance: 67.7 mL/min (by C-G formula based on SCr of 0.81 mg/dL). Liver Function Tests:  Recent Labs Lab 08/10/16 2158 08/11/16 0454  AST 17 16  ALT 13* 12*  ALKPHOS 118 104  BILITOT 0.8 1.0  PROT 8.2* 7.3  ALBUMIN 4.0 3.6    Recent Labs Lab 08/10/16 2158  LIPASE 24   No results for input(s): AMMONIA in the last 168 hours. Coagulation Profile: No results for input(s): INR, PROTIME in the last 168 hours. Cardiac Enzymes: No results for input(s): CKTOTAL, CKMB, CKMBINDEX, TROPONINI in the last 168 hours. BNP (last 3 results) No results for input(s): PROBNP in the last 8760 hours. HbA1C: No results for input(s): HGBA1C in the last 72 hours. CBG:  Recent Labs Lab 08/11/16 0752 08/11/16 1230 08/11/16 1729 08/11/16 2141 08/12/16 0723  GLUCAP 347* 313* 112* 156* 231*   Lipid Profile: No results for input(s): CHOL, HDL, LDLCALC, TRIG, CHOLHDL, LDLDIRECT in the last 72 hours. Thyroid Function Tests: No results for input(s): TSH, T4TOTAL, FREET4, T3FREE, THYROIDAB in the last 72 hours. Anemia Panel: No results for input(s): VITAMINB12, FOLATE, FERRITIN, TIBC, IRON, RETICCTPCT in the last 72 hours. Urine analysis:    Component Value Date/Time   COLORURINE AMBER (A) 08/11/2016 0012   APPEARANCEUR CLOUDY (A) 08/11/2016 0012   LABSPEC >1.046 (H) 08/11/2016 0012   PHURINE 5.0 08/11/2016 0012   GLUCOSEU >=500 (A) 08/11/2016 0012   HGBUR SMALL (A) 08/11/2016 0012   BILIRUBINUR NEGATIVE 08/11/2016 0012   KETONESUR NEGATIVE 08/11/2016 0012    PROTEINUR >=300 (A) 08/11/2016 0012   UROBILINOGEN 0.2 07/20/2014 2228   NITRITE NEGATIVE 08/11/2016 0012   LEUKOCYTESUR LARGE (A) 08/11/2016 0012   Sepsis Labs: @LABRCNTIP (procalcitonin:4,lacticidven:4) ) Recent Results (from the past 240 hour(s))  Blood culture (routine x 2)     Status: None (Preliminary result)   Collection Time: 08/10/16 10:09 PM  Result Value Ref Range Status   Specimen Description BLOOD LEFT HAND  Final   Special Requests IN PEDIATRIC BOTTLE Blood Culture adequate volume  Final   Culture   Final    NO GROWTH < 24 HOURS Performed at [facility], [place],  [place] [address]    Report Status PENDING  Incomplete  Blood culture (routine x 2)     Status: None (Preliminary result)   Collection Time: 08/10/16 11:31 PM  Result Value Ref Range Status   Specimen Description BLOOD LEFT HAND  Final   Special Requests IN PEDIATRIC BOTTLE Blood Culture adequate volume  Final   Culture  Setup Time   Final    GRAM POSITIVE COCCI IN CLUSTERS IN PEDIATRIC BOTTLE CRITICAL RESULT CALLED TO, READ BACK BY AND VERIFIED WITH: Nani Skillern [phone] MLM Performed at [facility], [place] [address] [place], [place] [address]    Culture GRAM POSITIVE COCCI  Final   Report Status PENDING  Incomplete  Blood Culture ID Panel (Reflexed)     Status: Abnormal   Collection Time: 08/10/16 11:31 PM  Result Value Ref Range Status   Enterococcus species NOT DETECTED NOT DETECTED Final   Listeria monocytogenes NOT DETECTED NOT DETECTED Final   Staphylococcus species DETECTED (A) NOT DETECTED Final    Comment: Methicillin (oxacillin) resistant coagulase negative staphylococcus. Possible blood culture contaminant (unless isolated from more than one blood culture draw or clinical case suggests pathogenicity). No antibiotic treatment is indicated for blood  culture contaminants. CRITICAL RESULT CALLED TO, READ BACK BY AND VERIFIED WITH: PHARMD N Bowman [id number]  [id number] MLM    Staphylococcus aureus NOT DETECTED NOT DETECTED Final   Methicillin resistance DETECTED (A) NOT DETECTED Final    Comment: CRITICAL RESULT CALLED TO, READ BACK BY AND VERIFIED WITH: PHARMD N GLOGOVAC [id number] MLM    Streptococcus species NOT DETECTED NOT DETECTED Final   Streptococcus agalactiae NOT DETECTED NOT DETECTED Final   Streptococcus pneumoniae NOT DETECTED NOT DETECTED Final   Streptococcus pyogenes NOT DETECTED NOT DETECTED Final   Acinetobacter baumannii NOT DETECTED NOT DETECTED Final   Enterobacteriaceae species NOT DETECTED NOT DETECTED Final   Enterobacter cloacae complex NOT DETECTED NOT DETECTED Final   Escherichia coli NOT DETECTED NOT DETECTED Final   Klebsiella oxytoca NOT DETECTED NOT DETECTED Final   Klebsiella pneumoniae NOT DETECTED NOT DETECTED Final   Proteus species NOT DETECTED NOT DETECTED Final   Serratia marcescens NOT DETECTED NOT DETECTED Final   Haemophilus influenzae NOT DETECTED NOT DETECTED Final   Neisseria meningitidis NOT DETECTED NOT DETECTED Final   Pseudomonas aeruginosa NOT DETECTED NOT DETECTED Final   Candida albicans NOT DETECTED NOT DETECTED Final   Candida glabrata NOT DETECTED NOT DETECTED Final   Candida krusei NOT DETECTED NOT DETECTED Final   Candida parapsilosis NOT DETECTED NOT DETECTED Final   Candida tropicalis NOT DETECTED NOT DETECTED Final    Comment: Performed at [facility], [place]. [address] [place], [place] [address]  Urine culture     Status: Abnormal (Preliminary result)   Collection Time: 08/11/16  1:23 AM  Result Value Ref Range Status   Specimen Description URINE, CLEAN CATCH  Final   Special Requests Normal  Final   Culture >=100,000 COLONIES/mL GRAM NEGATIVE RODS (A)  Final   Report Status PENDING  Incomplete  MRSA PCR Screening     Status: None   Collection Time: 08/11/16  7:30 AM  Result Value Ref Range Status   MRSA by PCR NEGATIVE NEGATIVE Final    Comment:        The GeneXpert MRSA  Assay (FDA approved for NASAL specimens only), is one component of a comprehensive MRSA colonization surveillance program. It is not intended to diagnose MRSA infection  nor to guide or monitor treatment for MRSA infections.      Radiology Studies: Ct Head Wo Contrast  Result Date: 08/10/2016 CLINICAL DATA:  76 year old female with altered mental status. EXAM: CT HEAD WITHOUT CONTRAST TECHNIQUE: Contiguous axial images were obtained from the base of the skull through the vertex without intravenous contrast. COMPARISON:  Brain MRI dated 07/11/2014 FINDINGS: Brain: There is moderate age-related atrophy and chronic microvascular ischemic changes. There is dilatation of the ventricles out of proportion with the sulci which may represent central volume loss versus normal pressure hydrocephalus. Clinical correlation is recommended. There is no acute intracranial hemorrhage. No mass effect or midline shift noted. No extra-axial fluid collection. Vascular: No hyperdense vessel or unexpected calcification. Skull: Normal. Negative for fracture or focal lesion. Sinuses/Orbits: No acute finding. Other: None. IMPRESSION: 1. No acute intracranial hemorrhage. 2. Moderate age-related atrophy and chronic microvascular ischemic changes. If symptoms persist, and there are no contraindications, MRI may provide better evaluation if clinically indicated. Electronically Signed   By: Anner Crete M.D.   On: 08/10/2016 23:50   Ct Abdomen Pelvis W Contrast  Result Date: 08/11/2016 CLINICAL DATA:  Abdominal pain, vomiting and diarrhea. EXAM: CT ABDOMEN AND PELVIS WITH CONTRAST TECHNIQUE: Multidetector CT imaging of the abdomen and pelvis was performed using the standard protocol following bolus administration of intravenous contrast. CONTRAST:  13mL ISOVUE-300 IOPAMIDOL (ISOVUE-300) INJECTION 61% COMPARISON:  CT abdomen 07/30/2015.  CTA 09/15/2010 FINDINGS: Lower chest: Mild atelectasis in both lower lobes. No pleural  effusion. Hepatobiliary: No focal hepatic lesion. Mild gallbladder distention with layering hyperdensity, stones versus sludge. No evidence of pericholecystic inflammation. No biliary dilatation. Pancreas: No ductal dilatation or inflammation. Spleen: Small enhancing splenic lesions are again seen, some which are not as well visualized on the current exam. No new focal abnormality. Adrenals/Urinary Tract: Unchanged 2 cm left adrenal nodule. The right adrenal gland is normal. No hydronephrosis or perinephric edema. Small cortical hypodensity in the upper left kidney is unchanged from prior. Subcentimeter right renal cortical hypodensities are too small to characterize. There is delayed renal excretion bilaterally urinary bladder is minimally distended with wall thickening. There is air in the bladder. Mild perivesicular soft tissue stranding. Stomach/Bowel: The rectum is distended with stool spanning 7.6 cm. There is rectal wall thickening and surrounding soft tissue edema. Diffuse colonic tortuosity. Small to moderate stool in the more proximal colon. No additional colonic wall thickening. No small bowel dilatation or inflammation. The stomach is decompressed. The appendix is not confidently visualized, no right lower quadrant or pericecal inflammation. Vascular/Lymphatic: Normal caliber abdominal aorta. No abdominal or pelvic adenopathy. Reproductive: Post hysterectomy. There is air in the vagina. No adnexal mass. Other: No ascites or free air. Laxity of the anterior abdominal wall without frank hernia. Musculoskeletal: Chronic L3 compression fracture with augmentation. Chronic prominent Schmorl's nodes within superior and inferior endplates of L1. Multilevel degenerative change throughout the lumbar spine. Intramedullary rod in the right femur is partially included. IMPRESSION: 1. Rectal distention with wall thickening and surrounding soft tissue edema suspicious for fecal impaction. Colonic tortuosity with  moderate colonic stool burden, likely chronic constipation. No bowel obstruction. 2. Bladder wall thickening with mild perivesicular edema. This is likely secondary to cystitis. There is air in the urinary bladder, which may be iatrogenic, recommend correlation for catheterization attempts. Alternatively, emphysematous cystitis is considered, however no air in the bladder wall. Colovesicular fistula is not excluded. 3. Post hysterectomy. 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 4. Stones  or sludge in the gallbladder without CT findings of cholecystitis. 5. Hyperenhancing splenic lesions are again seen, not as well seen compared to prior exam, possibly due to phase contrast. These are stable compared to 2012 CT suggesting hemangiomas, but nonspecific. 6. Stable left adrenal adenoma. Electronically Signed   By: Jeb Levering M.D.   On: 08/11/2016 00:07   Dg Chest Portable 1 View  Result Date: 08/10/2016 CLINICAL DATA:  Initial evaluation for acute altered mental status. EXAM: PORTABLE CHEST 1 VIEW COMPARISON:  The prior radiograph from 07/09/2016 FINDINGS: Mild cardiomegaly, stable. Mediastinal silhouette within normal limits. Lungs are hypoinflated. Patchy bibasilar opacities favored to reflect atelectasis/bronchovascular crowding, although superimposed infiltrates could be considered in the correct clinical setting. No pulmonary edema or pleural effusion. No pneumothorax. No acute osseous abnormality. IMPRESSION: Shallow lung inflation with patchy bibasilar opacities, left greater than right. Atelectasis/bronchovascular crowding is favored, although superimposed infiltrates could be considered in the correct clinical setting. Electronically Signed   By: Jeannine Boga M.D.   On: 08/10/2016 23:04    Scheduled Meds: . ALPRAZolam  0.25 mg Oral BID  . aspirin  81 mg Oral Daily  . docusate sodium  100 mg Oral BID  . DULoxetine  30 mg Oral BID  . enoxaparin (LOVENOX)  injection  40 mg Subcutaneous Q24H  . feeding supplement (ENSURE ENLIVE)  120 mL Oral BID  . insulin aspart  0-15 Units Subcutaneous TID WC  . insulin aspart  0-5 Units Subcutaneous QHS  . insulin detemir  12 Units Subcutaneous Daily  . insulin detemir  8 Units Subcutaneous QHS  . metoprolol succinate  50 mg Oral Daily  . polyethylene glycol  17 g Oral Daily  . potassium chloride  40 mEq Oral Once  . pramipexole  1.5 mg Oral TID  . sorbitol, milk of mag, mineral oil, glycerin (SMOG) enema  960 mL Rectal Once   Continuous Infusions: . cefTRIAXone (ROCEPHIN)  IV Stopped (08/11/16 2226)     LOS: 1 day    Time spent in minutes: 35  Signature  Lala Lund M.D on 08/12/2016 at 11:03 AM  Between 7am to 7pm - Pager - [phone] ( page via [email or web address], text pages only, please mention full 10 digit call back number).  After 7pm go to [email or web address] - password [facility]

## 2016-08-12 NOTE — Progress Notes (Signed)
Inpatient Diabetes Program Recommendations  AACE/ADA: New Consensus Statement on Inpatient Glycemic Control (2015)  Target Ranges:  Prepandial:   less than 140 mg/dL      Peak postprandial:   less than 180 mg/dL (1-2 hours)      Critically ill patients:  140 - 180 mg/dL   Results for [name], [name] (MRN [id number]) as of 08/12/2016 10:29  Ref. Range 08/11/2016 07:52 08/11/2016 12:30 08/11/2016 17:29 08/11/2016 21:41  Glucose-Capillary Latest Ref Range: 65 - 99 mg/dL 347 (H) 313 (H) 112 (H) 156 (H)   Results for [name], [name] (MRN [id number]) as of 08/12/2016 10:29  Ref. Range 08/12/2016 07:23  Glucose-Capillary Latest Ref Range: 65 - 99 mg/dL 231 (H)    Home DM Meds: Levemir 12 units AM/ 8 units PM       Novolog 2-11 units TID per SSI  Current Insulin Orders: Levemir 12 units AM/ 8 units PM           Novolog Moderate Correction Scale/ SSI (0-15 units) TID AC + HS        MD- Note fasting CBG this AM elevated to 231 mg/dl.  Please consider increasing Levemir slightly to Levemir 12 units BID     --Will follow patient during hospitalization--  Wyn Quaker RN, MSN, CDE Diabetes Coordinator Inpatient Glycemic Control Team Team Pager: [phone] (8a-5p)

## 2016-08-12 NOTE — Progress Notes (Signed)
[facility] Progress Note     Subjective: CC: AMS Patient with dementia, resting comfortably in bed. Had a very large BM and urinated. No abdominal pain, no n/v.   Objective: Vital signs in last 24 hours: Temp:  [98.1 F (36.7 C)-98.5 F (36.9 C)] 98.1 F (36.7 C) (06/22 0630) Pulse Rate:  [73-112] 73 (06/22 0630) Resp:  [15-16] 16 (06/22 0630) BP: (111-186)/(49-101) 185/88 (06/22 0630) SpO2:  [91 %-98 %] 98 % (06/22 0630) Weight:  [91.8 kg (202 lb 4.8 oz)] 91.8 kg (202 lb 4.8 oz) (06/21 1559) Last BM Date: 08/11/16  Intake/Output from previous day: 06/21 0701 - 06/22 0700 In: 2420 [P.O.:670; I.V.:1700; IV Piggyback:50] Out: -  Intake/Output this shift: Total I/O In: 120 [P.O.:120] Out: -   PE: Gen:  Alert, NAD, pleasant Card:  Regular rate and rhythm, pedal pulses 2+ BL Pulm:  Normal effort, clear to auscultation bilaterally Abd: Soft, non-tender, non-distended, bowel sounds present in all 4 quadrants G/U: no tenderness on bimanual exam, no stool in vagina.  Skin: warm and dry, no rashes  Psych: A&Ox3   Lab Results:   Recent Labs  08/11/16 0454 08/12/16 0456  WBC 12.1* 7.1  HGB 12.5 11.0*  HCT 38.6 34.6*  PLT 284 259   BMET  Recent Labs  08/11/16 0454 08/12/16 0456  NA 142 146*  K 4.3 3.3*  CL 108 110  CO2 24 25  GLUCOSE 328* 252*  BUN 31* 27*  CREATININE 1.19* 0.81  CALCIUM 9.5 9.5   CMP     Component Value Date/Time   NA 146 (H) 08/12/2016 0456   K 3.3 (L) 08/12/2016 0456   CL 110 08/12/2016 0456   CO2 25 08/12/2016 0456   GLUCOSE 252 (H) 08/12/2016 0456   BUN 27 (H) 08/12/2016 0456   CREATININE 0.81 08/12/2016 0456   CALCIUM 9.5 08/12/2016 0456   PROT 7.3 08/11/2016 0454   ALBUMIN 3.6 08/11/2016 0454   AST 16 08/11/2016 0454   ALT 12 (L) 08/11/2016 0454   ALKPHOS 104 08/11/2016 0454   BILITOT 1.0 08/11/2016 0454   GFRNONAA >60 08/12/2016 0456   GFRAA >60 08/12/2016 0456   Lipase     Component Value Date/Time    LIPASE 24 08/10/2016 2158     Studies/Results: Ct Head Wo Contrast  Result Date: 08/10/2016 CLINICAL DATA:  76 year old female with altered mental status. EXAM: CT HEAD WITHOUT CONTRAST TECHNIQUE: Contiguous axial images were obtained from the base of the skull through the vertex without intravenous contrast. COMPARISON:  Brain MRI dated 07/11/2014 FINDINGS: Brain: There is moderate age-related atrophy and chronic microvascular ischemic changes. There is dilatation of the ventricles out of proportion with the sulci which may represent central volume loss versus normal pressure hydrocephalus. Clinical correlation is recommended. There is no acute intracranial hemorrhage. No mass effect or midline shift noted. No extra-axial fluid collection. Vascular: No hyperdense vessel or unexpected calcification. Skull: Normal. Negative for fracture or focal lesion. Sinuses/Orbits: No acute finding. Other: None. IMPRESSION: 1. No acute intracranial hemorrhage. 2. Moderate age-related atrophy and chronic microvascular ischemic changes. If symptoms persist, and there are no contraindications, MRI may provide better evaluation if clinically indicated. Electronically Signed   By: Anner Crete M.D.   On: 08/10/2016 23:50   Ct Abdomen Pelvis W Contrast  Result Date: 08/11/2016 CLINICAL DATA:  Abdominal pain, vomiting and diarrhea. EXAM: CT ABDOMEN AND PELVIS WITH CONTRAST TECHNIQUE: Multidetector CT imaging of the abdomen and pelvis was performed using the standard  protocol following bolus administration of intravenous contrast. CONTRAST:  14mL ISOVUE-300 IOPAMIDOL (ISOVUE-300) INJECTION 61% COMPARISON:  CT abdomen 07/30/2015.  CTA 09/15/2010 FINDINGS: Lower chest: Mild atelectasis in both lower lobes. No pleural effusion. Hepatobiliary: No focal hepatic lesion. Mild gallbladder distention with layering hyperdensity, stones versus sludge. No evidence of pericholecystic inflammation. No biliary dilatation. Pancreas: No  ductal dilatation or inflammation. Spleen: Small enhancing splenic lesions are again seen, some which are not as well visualized on the current exam. No new focal abnormality. Adrenals/Urinary Tract: Unchanged 2 cm left adrenal nodule. The right adrenal gland is normal. No hydronephrosis or perinephric edema. Small cortical hypodensity in the upper left kidney is unchanged from prior. Subcentimeter right renal cortical hypodensities are too small to characterize. There is delayed renal excretion bilaterally urinary bladder is minimally distended with wall thickening. There is air in the bladder. Mild perivesicular soft tissue stranding. Stomach/Bowel: The rectum is distended with stool spanning 7.6 cm. There is rectal wall thickening and surrounding soft tissue edema. Diffuse colonic tortuosity. Small to moderate stool in the more proximal colon. No additional colonic wall thickening. No small bowel dilatation or inflammation. The stomach is decompressed. The appendix is not confidently visualized, no right lower quadrant or pericecal inflammation. Vascular/Lymphatic: Normal caliber abdominal aorta. No abdominal or pelvic adenopathy. Reproductive: Post hysterectomy. There is air in the vagina. No adnexal mass. Other: No ascites or free air. Laxity of the anterior abdominal wall without frank hernia. Musculoskeletal: Chronic L3 compression fracture with augmentation. Chronic prominent Schmorl's nodes within superior and inferior endplates of L1. Multilevel degenerative change throughout the lumbar spine. Intramedullary rod in the right femur is partially included. IMPRESSION: 1. Rectal distention with wall thickening and surrounding soft tissue edema suspicious for fecal impaction. Colonic tortuosity with moderate colonic stool burden, likely chronic constipation. No bowel obstruction. 2. Bladder wall thickening with mild perivesicular edema. This is likely secondary to cystitis. There is air in the urinary bladder,  which may be iatrogenic, recommend correlation for catheterization attempts. Alternatively, emphysematous cystitis is considered, however no air in the bladder wall. Colovesicular fistula is not excluded. 3. Post hysterectomy. 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 4. Stones or sludge in the gallbladder without CT findings of cholecystitis. 5. Hyperenhancing splenic lesions are again seen, not as well seen compared to prior exam, possibly due to phase contrast. These are stable compared to 2012 CT suggesting hemangiomas, but nonspecific. 6. Stable left adrenal adenoma. Electronically Signed   By: Jeb Levering M.D.   On: 08/11/2016 00:07   Dg Chest Portable 1 View  Result Date: 08/10/2016 CLINICAL DATA:  Initial evaluation for acute altered mental status. EXAM: PORTABLE CHEST 1 VIEW COMPARISON:  The prior radiograph from 07/09/2016 FINDINGS: Mild cardiomegaly, stable. Mediastinal silhouette within normal limits. Lungs are hypoinflated. Patchy bibasilar opacities favored to reflect atelectasis/bronchovascular crowding, although superimposed infiltrates could be considered in the correct clinical setting. No pulmonary edema or pleural effusion. No pneumothorax. No acute osseous abnormality. IMPRESSION: Shallow lung inflation with patchy bibasilar opacities, left greater than right. Atelectasis/bronchovascular crowding is favored, although superimposed infiltrates could be considered in the correct clinical setting. Electronically Signed   By: Jeannine Boga M.D.   On: 08/10/2016 23:04    Anti-infectives: Anti-infectives    Start     Dose/Rate Route Frequency Ordered Stop   08/11/16 0130  cefTRIAXone (ROCEPHIN) 2 g in dextrose 5 % 50 mL IVPB     2 g 100 mL/hr over 30 Minutes Intravenous  Daily at bedtime 08/11/16 0124     08/10/16 2330  vancomycin (VANCOCIN) IVPB 1000 mg/200 mL premix     1,000 mg 200 mL/hr over 60 Minutes Intravenous  Once 08/10/16 2327 08/11/16  0105   08/10/16 2330  piperacillin-tazobactam (ZOSYN) IVPB 3.375 g     3.375 g 100 mL/hr over 30 Minutes Intravenous  Once 08/10/16 2327 08/11/16 0103       Assessment/Plan Sepsis due to UTI  - on rocephin  - urine cx pending, WBC 7.1 today   - sepsis physiology resolved  Dehydration - improving  Bladder wall thickening with air in/outside bladder wall border - had foley catheter for a time Concern for possible colovesicular/colovaginal fistula - no reports of stool with urine or stool from vagina Constipation with fecal impaction - patient with very large bowel movement and per RN had one last night as well - hypokalemia - 3.3 today Dementia with AMS T2DM HTN Bed/chair bound with recent knee replacement, fall with R femur fx Malnutrition/deconditioning   Plan: Continue with treatment for UTI and dehydration.   No obvious indications for any surgery at this time.  LOS: 1 day    Brigid Re , [facility] Surgery 08/12/2016, 11:32 AM Pager: [phone] Consults: [phone]  Agree with above. She is by herself today.  She does not remember me from yesterday.  She's not sure where she is or how she got here.  WBC now normal.  Had large BM.  Her exam is benign. No surgical issues. Will sign off.  Alphonsa Overall, MD, [facility] Surgery Pager: [phone] Office phone:  [phone]

## 2016-08-12 NOTE — Progress Notes (Signed)
PHARMACY - PHYSICIAN COMMUNICATION CRITICAL VALUE ALERT - BLOOD CULTURE IDENTIFICATION (BCID)  Results for orders placed or performed during the hospital encounter of 08/10/16  Blood Culture ID Panel (Reflexed) (Collected: 08/10/2016 11:31 PM)  Result Value Ref Range   Enterococcus species NOT DETECTED NOT DETECTED   Listeria monocytogenes NOT DETECTED NOT DETECTED   Staphylococcus species DETECTED (A) NOT DETECTED   Staphylococcus aureus NOT DETECTED NOT DETECTED   Methicillin resistance DETECTED (A) NOT DETECTED   Streptococcus species NOT DETECTED NOT DETECTED   Streptococcus agalactiae NOT DETECTED NOT DETECTED   Streptococcus pneumoniae NOT DETECTED NOT DETECTED   Streptococcus pyogenes NOT DETECTED NOT DETECTED   Acinetobacter baumannii NOT DETECTED NOT DETECTED   Enterobacteriaceae species NOT DETECTED NOT DETECTED   Enterobacter cloacae complex NOT DETECTED NOT DETECTED   Escherichia coli NOT DETECTED NOT DETECTED   Klebsiella oxytoca NOT DETECTED NOT DETECTED   Klebsiella pneumoniae NOT DETECTED NOT DETECTED   Proteus species NOT DETECTED NOT DETECTED   Serratia marcescens NOT DETECTED NOT DETECTED   Haemophilus influenzae NOT DETECTED NOT DETECTED   Neisseria meningitidis NOT DETECTED NOT DETECTED   Pseudomonas aeruginosa NOT DETECTED NOT DETECTED   Candida albicans NOT DETECTED NOT DETECTED   Candida glabrata NOT DETECTED NOT DETECTED   Candida krusei NOT DETECTED NOT DETECTED   Candida parapsilosis NOT DETECTED NOT DETECTED   Candida tropicalis NOT DETECTED NOT DETECTED   Methicillin (oxacillin) resistant coagulase negative staphylococcus. Possible blood culture contaminant since only 1/2 cultures. UCx: 100k GNR Afebrile, WBC improved to WNL SCr improved (1.19 > )  6/21 0100  Zosyn/Vanc x1 dose only 6/21 >> Ceftriaxone 2g IV q24h >>  Name of physician (or Provider) Contacted: Dr Candiss Norse  Changes to prescribed antibiotics required: None  Gretta Arab PharmD,  [facility] Pager [phone] 08/12/2016 9:49 AM

## 2016-08-13 DIAGNOSIS — M1990 Unspecified osteoarthritis, unspecified site: Secondary | ICD-10-CM | POA: Diagnosis not present

## 2016-08-13 DIAGNOSIS — R410 Disorientation, unspecified: Secondary | ICD-10-CM | POA: Diagnosis not present

## 2016-08-13 DIAGNOSIS — Z7982 Long term (current) use of aspirin: Secondary | ICD-10-CM | POA: Diagnosis not present

## 2016-08-13 DIAGNOSIS — Z96651 Presence of right artificial knee joint: Secondary | ICD-10-CM | POA: Diagnosis present

## 2016-08-13 DIAGNOSIS — R339 Retention of urine, unspecified: Secondary | ICD-10-CM | POA: Diagnosis not present

## 2016-08-13 DIAGNOSIS — R262 Difficulty in walking, not elsewhere classified: Secondary | ICD-10-CM | POA: Diagnosis not present

## 2016-08-13 DIAGNOSIS — R402421 Glasgow coma scale score 9-12, in the field [EMT or ambulance]: Secondary | ICD-10-CM | POA: Diagnosis not present

## 2016-08-13 DIAGNOSIS — E876 Hypokalemia: Secondary | ICD-10-CM | POA: Diagnosis not present

## 2016-08-13 DIAGNOSIS — N390 Urinary tract infection, site not specified: Secondary | ICD-10-CM | POA: Diagnosis not present

## 2016-08-13 DIAGNOSIS — E1165 Type 2 diabetes mellitus with hyperglycemia: Secondary | ICD-10-CM | POA: Diagnosis not present

## 2016-08-13 DIAGNOSIS — Z96622 Presence of left artificial elbow joint: Secondary | ICD-10-CM | POA: Diagnosis present

## 2016-08-13 DIAGNOSIS — Z66 Do not resuscitate: Secondary | ICD-10-CM | POA: Diagnosis present

## 2016-08-13 DIAGNOSIS — R4182 Altered mental status, unspecified: Secondary | ICD-10-CM | POA: Diagnosis not present

## 2016-08-13 DIAGNOSIS — R278 Other lack of coordination: Secondary | ICD-10-CM | POA: Diagnosis not present

## 2016-08-13 DIAGNOSIS — Z882 Allergy status to sulfonamides status: Secondary | ICD-10-CM | POA: Diagnosis not present

## 2016-08-13 DIAGNOSIS — R6520 Severe sepsis without septic shock: Secondary | ICD-10-CM | POA: Diagnosis not present

## 2016-08-13 DIAGNOSIS — F418 Other specified anxiety disorders: Secondary | ICD-10-CM | POA: Diagnosis not present

## 2016-08-13 DIAGNOSIS — B9689 Other specified bacterial agents as the cause of diseases classified elsewhere: Secondary | ICD-10-CM | POA: Diagnosis not present

## 2016-08-13 DIAGNOSIS — F0150 Vascular dementia without behavioral disturbance: Secondary | ICD-10-CM | POA: Diagnosis not present

## 2016-08-13 DIAGNOSIS — N179 Acute kidney failure, unspecified: Secondary | ICD-10-CM | POA: Diagnosis not present

## 2016-08-13 DIAGNOSIS — K3184 Gastroparesis: Secondary | ICD-10-CM | POA: Diagnosis not present

## 2016-08-13 DIAGNOSIS — F4323 Adjustment disorder with mixed anxiety and depressed mood: Secondary | ICD-10-CM | POA: Diagnosis not present

## 2016-08-13 DIAGNOSIS — S72401D Unspecified fracture of lower end of right femur, subsequent encounter for closed fracture with routine healing: Secondary | ICD-10-CM | POA: Diagnosis not present

## 2016-08-13 DIAGNOSIS — J302 Other seasonal allergic rhinitis: Secondary | ICD-10-CM | POA: Diagnosis not present

## 2016-08-13 DIAGNOSIS — G8929 Other chronic pain: Secondary | ICD-10-CM | POA: Diagnosis not present

## 2016-08-13 DIAGNOSIS — Z79899 Other long term (current) drug therapy: Secondary | ICD-10-CM | POA: Diagnosis not present

## 2016-08-13 DIAGNOSIS — F419 Anxiety disorder, unspecified: Secondary | ICD-10-CM | POA: Diagnosis present

## 2016-08-13 DIAGNOSIS — R509 Fever, unspecified: Secondary | ICD-10-CM | POA: Diagnosis not present

## 2016-08-13 DIAGNOSIS — I160 Hypertensive urgency: Secondary | ICD-10-CM | POA: Diagnosis not present

## 2016-08-13 DIAGNOSIS — F0390 Unspecified dementia without behavioral disturbance: Secondary | ICD-10-CM | POA: Diagnosis present

## 2016-08-13 DIAGNOSIS — A419 Sepsis, unspecified organism: Secondary | ICD-10-CM | POA: Diagnosis not present

## 2016-08-13 DIAGNOSIS — G9340 Encephalopathy, unspecified: Secondary | ICD-10-CM | POA: Diagnosis not present

## 2016-08-13 DIAGNOSIS — I10 Essential (primary) hypertension: Secondary | ICD-10-CM | POA: Diagnosis not present

## 2016-08-13 DIAGNOSIS — D500 Iron deficiency anemia secondary to blood loss (chronic): Secondary | ICD-10-CM | POA: Diagnosis not present

## 2016-08-13 DIAGNOSIS — K5900 Constipation, unspecified: Secondary | ICD-10-CM | POA: Diagnosis not present

## 2016-08-13 DIAGNOSIS — E669 Obesity, unspecified: Secondary | ICD-10-CM | POA: Diagnosis not present

## 2016-08-13 DIAGNOSIS — N3001 Acute cystitis with hematuria: Secondary | ICD-10-CM | POA: Diagnosis present

## 2016-08-13 DIAGNOSIS — E559 Vitamin D deficiency, unspecified: Secondary | ICD-10-CM | POA: Diagnosis not present

## 2016-08-13 DIAGNOSIS — R41841 Cognitive communication deficit: Secondary | ICD-10-CM | POA: Diagnosis not present

## 2016-08-13 DIAGNOSIS — K047 Periapical abscess without sinus: Secondary | ICD-10-CM | POA: Diagnosis not present

## 2016-08-13 DIAGNOSIS — F329 Major depressive disorder, single episode, unspecified: Secondary | ICD-10-CM | POA: Diagnosis not present

## 2016-08-13 DIAGNOSIS — E119 Type 2 diabetes mellitus without complications: Secondary | ICD-10-CM | POA: Diagnosis present

## 2016-08-13 DIAGNOSIS — G2581 Restless legs syndrome: Secondary | ICD-10-CM | POA: Diagnosis not present

## 2016-08-13 DIAGNOSIS — Z794 Long term (current) use of insulin: Secondary | ICD-10-CM | POA: Diagnosis not present

## 2016-08-13 DIAGNOSIS — K219 Gastro-esophageal reflux disease without esophagitis: Secondary | ICD-10-CM | POA: Diagnosis present

## 2016-08-13 DIAGNOSIS — F322 Major depressive disorder, single episode, severe without psychotic features: Secondary | ICD-10-CM | POA: Diagnosis not present

## 2016-08-13 DIAGNOSIS — M6281 Muscle weakness (generalized): Secondary | ICD-10-CM | POA: Diagnosis not present

## 2016-08-13 DIAGNOSIS — Z885 Allergy status to narcotic agent status: Secondary | ICD-10-CM | POA: Diagnosis not present

## 2016-08-13 DIAGNOSIS — E1100 Type 2 diabetes mellitus with hyperosmolarity without nonketotic hyperglycemic-hyperosmolar coma (NKHHC): Secondary | ICD-10-CM | POA: Diagnosis not present

## 2016-08-13 LAB — BASIC METABOLIC PANEL
Anion gap: 11 (ref 5–15)
BUN: 17 mg/dL (ref 6–20)
CALCIUM: 9.5 mg/dL (ref 8.9–10.3)
CO2: 24 mmol/L (ref 22–32)
CREATININE: 0.78 mg/dL (ref 0.44–1.00)
Chloride: 102 mmol/L (ref 101–111)
GFR calc Af Amer: >60 mL/min (ref >60)
GLUCOSE: 196 mg/dL — AB (ref 65–99)
Potassium: 3.6 mmol/L (ref 3.5–5.1)
Sodium: 137 mmol/L (ref 135–145)

## 2016-08-13 LAB — URINE CULTURE
Culture: >=100000 — AB
SPECIAL REQUESTS: NORMAL

## 2016-08-13 LAB — GLUCOSE, CAPILLARY
Glucose-Capillary: 206 mg/dL — ABNORMAL HIGH (ref 65–99)
Glucose-Capillary: 162 mg/dL — ABNORMAL HIGH (ref 65–99)
Glucose-Capillary: 265 mg/dL — ABNORMAL HIGH (ref 65–99)

## 2016-08-13 MED ORDER — ALPRAZOLAM 0.25 MG PO TABS: 0.2500 mg | ORAL_TABLET | Freq: Two times a day (BID) | ORAL | 0 refills | Status: DC

## 2016-08-13 MED ORDER — TRAMADOL HCL 50 MG PO TABS: 50.0000 mg | ORAL_TABLET | Freq: Four times a day (QID) | ORAL | 0 refills | Status: DC | PRN

## 2016-08-13 MED ORDER — CEFPODOXIME PROXETIL 200 MG PO TABS: 200.0000 mg | ORAL_TABLET | Freq: Two times a day (BID) | ORAL | 0 refills | Status: DC

## 2016-08-13 NOTE — Discharge Summary (Signed)
Camrin Lapre [place] YHC:[id number] DOB: Mar 30, 1940 DOA: 08/10/2016  PCP: [name], MD  Admit date: 08/10/2016  Discharge date: 08/13/2016  Admitted From: SNF  Disposition:  SNF   Recommendations for Outpatient Follow-up:   Follow up with PCP in 1-2 weeks  PCP Please obtain BMP/CBC, 2 view CXR in 1week,  (see Discharge instructions)   PCP Please follow up on the following pending results: None   Home Health: None   Equipment/Devices: None  Consultations: CCS Discharge Condition: Fair   CODE STATUS: DNR   Diet Recommendation: Diet Carb Modified   Heart Healthy    Chief Complaint  Patient presents with  . 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 Also noted to have rectal fecal impaction on CT.                                                                   Hospital Course      Severe sepsis due to UTI, massive stool bordering on CT scan -  CT findings discussed with general surgeon on call Dr. Lucia Gaskins, he recommended to treat UTI, he is not convinced that patient truly has a fistula. There was massive stool burden for which she received a bowel regimen with good effect, she had a large bowel movement yesterday with almost complete resolution of her stomach discomfort. He treat her for 2 more days with oral antibiotics for UTI. Currently no signs of sepsis, appears nontoxic will be discharged with outpatient follow-up with general surgery.  ARF due to dehydration  with hypernatremia. Resolved with IV fluids.   Acute encephalopathy superimposed on Dementia  - due to UTI and being in the hospital setting, CT head nonacute, no focal deficits, continue supportive care, minimize narcotics and benzodiazepines, remains at risk for being in delirium while in the hospital setting, daughter explained clearly, for now close to baseline and encephalopathy seems to have resolved.  Essential hypertension - on Lopressor and Norvasc, resume ACE inhibitor today as blood pressures are slightly on the higher side, continue monitoring blood pressure at SNF and adjust medications as needed.  DM type 2 (diabetes mellitus, type 2)  - continue home insulin regimen, check CBGs before every meal and at bedtime and adjust as needed at SNF.   Discharge diagnosis     Principal Problem:   Sepsis [facility]) Active Problems:   Essential hypertension   Lower urinary tract infectious disease   Acute encephalopathy  Dementia   DM type 2 (diabetes mellitus, type 2) ([place])   Emphysematous cystitis    Discharge instructions    Discharge Instructions    Discharge instructions    Complete by:  As directed    Follow with Primary MD [name], MD in 7 days   Get CBC, CMP, 2 view Chest X ray checked  by Primary MD or SNF MD in 5-7 days ( we routinely change or add medications that can affect your baseline labs and fluid status, therefore we recommend that you get the mentioned basic workup next visit with your PCP, your PCP may decide not to get them or add new tests based on their clinical decision)  Activity: As tolerated with Full fall precautions use walker/cane & assistance as needed  Disposition SNF    Diet:   Diet Carb Modified Fluid / Heart Healthy  Accuchecks 4 times/day, Once in AM empty stomach and then before each meal. Log in all results and show them to your Prim.MD in 3 days. If any glucose reading is under 80 or above 300 call your Prim MD  immidiately. Follow Low glucose instructions for glucose under 80 as instructed.   For Heart failure patients - Check your Weight same time everyday, if you gain over 2 pounds, or you develop in leg swelling, experience more shortness of breath or chest pain, call your Primary MD immediately. Follow Cardiac Low Salt Diet and 1.5 lit/day fluid restriction.  On your next visit with your primary care physician please Get Medicines reviewed and adjusted.  Please request your Prim.MD to go over all Hospital Tests and Procedure/Radiological results at the follow up, please get all Hospital records sent to your Prim MD by signing hospital release before you go home.  If you experience worsening of your admission symptoms, develop shortness of breath, life threatening emergency, suicidal or homicidal thoughts you must seek medical attention immediately by calling 911 or calling your MD immediately  if symptoms less severe.  You Must read complete instructions/literature along with all the possible adverse reactions/side effects for all the Medicines you take and that have been prescribed to you. Take any new Medicines after you have completely understood and accpet all the possible adverse reactions/side effects.   Do not drive, operate heavy machinery, perform activities at heights, swimming or participation in water activities or provide baby sitting services if your were admitted for syncope or siezures until you have seen by Primary MD or a Neurologist and advised to do so again.  Do not drive when taking Pain medications.    Do not take more than prescribed Pain, Sleep and Anxiety Medications  Special Instructions: If you have smoked or chewed Tobacco  in the last 2 yrs please stop smoking, stop any regular Alcohol  and or any Recreational drug use.  Wear Seat belts while driving.   Please note  You were cared for by a hospitalist during your hospital stay. If you have any questions about  your discharge medications or the care you received while you were in the hospital after you are discharged, you can call the unit and asked to speak with the hospitalist on call if the hospitalist that took care of you is not available. Once you are discharged, your primary care physician will handle any further medical issues. Please note that NO REFILLS for any discharge medications will be authorized once you are discharged, as it is imperative that you return to your primary care physician (or  establish a relationship with a primary care physician if you do not have one) for your aftercare needs so that they can reassess your need for medications and monitor your lab values.   Increase activity slowly    Complete by:  As directed       Discharge Medications   Allergies as of 08/13/2016      Reactions   Morphine And Related Other (See Comments)   Reaction:  Hallucinations    Oxycodone Other (See Comments)   Reaction:  Hallucinations   Sulfonamide Derivatives Swelling, Other (See Comments)   Reaction:  Unspecified swelling reaction       Medication List    TAKE these medications   acetaminophen 325 MG tablet Commonly known as:  TYLENOL Take 650 mg by mouth 2 (two) times daily.   acetaminophen 500 MG tablet Commonly known as:  TYLENOL Take 500 mg by mouth every 8 (eight) hours as needed for mild pain.   ALPRAZolam 0.25 MG tablet Commonly known as:  XANAX Take 1 tablet (0.25 mg total) by mouth 2 (two) times daily.   amLODipine 5 MG tablet Commonly known as:  NORVASC Take 1 tablet (5 mg total) by mouth daily.   aspirin 81 MG chewable tablet Chew 81 mg by mouth daily.   bisacodyl 10 MG suppository Commonly known as:  DULCOLAX Place 1 suppository (10 mg total) rectally daily as needed for moderate constipation.   cefpodoxime 200 MG tablet Commonly known as:  VANTIN Take 1 tablet (200 mg total) by mouth 2 (two) times daily. 2 more days   docusate sodium 100 MG  capsule Commonly known as:  COLACE Take 100 mg by mouth 2 (two) times daily.   DULoxetine 30 MG capsule Commonly known as:  CYMBALTA Take 30 mg by mouth 2 (two) times daily.   insulin aspart 100 UNIT/ML injection Commonly known as:  novoLOG Inject 2-11 Units into the skin 3 (three) times daily before meals. Pt uses per sliding scale:    101-150:  2 units 151-200:  3 units  201-250:  5 units  251-300:  7 units  301-350:  9 units  Greater than 350:  11 units Greater than 400 or less than 60:  Call MD   LEVEMIR FLEXTOUCH 100 UNIT/ML Pen Generic drug:  Insulin Detemir Inject 8-12 Units into the skin 2 (two) times daily. Pt uses 12 units in the morning and 8 units at bedtime.   loratadine 10 MG tablet Commonly known as:  CLARITIN Take 10 mg by mouth daily.   losartan 100 MG tablet Commonly known as:  COZAAR Take 100 mg by mouth every evening.   Magnesium Oxide 500 MG Tabs Take 500 mg by mouth daily.   methocarbamol 500 MG tablet Commonly known as:  ROBAXIN Take 1 tablet (500 mg total) by mouth every 6 (six) hours as needed for muscle spasms.   metoprolol succinate 50 MG 24 hr tablet Commonly known as:  TOPROL-XL Take 1 tablet (50 mg total) by mouth daily.   NUTRITIONAL SUPPLEMENT PLUS Liqd Take 120 mLs by mouth 2 (two) times daily. Med Pass   omeprazole 40 MG capsule Commonly known as:  PRILOSEC Take 40 mg by mouth daily.   polyethylene glycol packet Commonly known as:  MIRALAX / GLYCOLAX Take 17 g by mouth daily.   potassium bicarbonate 25 MEQ disintegrating tablet Commonly known as:  K-LYTE Take 25 mEq by mouth daily.   pramipexole 0.25 MG tablet Commonly known as:  MIRAPEX Take  1.5 mg by mouth 3 (three) times daily.   traMADol 50 MG tablet Commonly known as:  ULTRAM Take 1 tablet (50 mg total) by mouth every 6 (six) hours as needed for moderate pain.       Follow-up Information    [name], MD. Schedule an appointment as soon as possible for a visit  in 1 week(s).   Specialty:  Internal Medicine Contact information: [place] [address] [phone]        Alphonsa Overall, MD. Schedule an appointment as soon as possible for a visit in 2 week(s).   Specialty:  General Surgery Contact information: [place] [address] [phone]           Major procedures and Radiology Reports - PLEASE review detailed and final reports thoroughly  -     CT head, CT abdomen and pelvis. Nonacute, possible stool bordering on CT abdomen and pelvis, other nonspecific findings discussed with general surgery.  Ct Head Wo Contrast  Result Date: 08/10/2016 CLINICAL DATA:  76 year old female with altered mental status. EXAM: CT HEAD WITHOUT CONTRAST TECHNIQUE: Contiguous axial images were obtained from the base of the skull through the vertex without intravenous contrast. COMPARISON:  Brain MRI dated 07/11/2014 FINDINGS: Brain: There is moderate age-related atrophy and chronic microvascular ischemic changes. There is dilatation of the ventricles out of proportion with the sulci which may represent central volume loss versus normal pressure hydrocephalus. Clinical correlation is recommended. There is no acute intracranial hemorrhage. No mass effect or midline shift noted. No extra-axial fluid collection. Vascular: No hyperdense vessel or unexpected calcification. Skull: Normal. Negative for fracture or focal lesion. Sinuses/Orbits: No acute finding. Other: None. IMPRESSION: 1. No acute intracranial hemorrhage. 2. Moderate age-related atrophy and chronic microvascular ischemic changes. If symptoms persist, and there are no contraindications, MRI may provide better evaluation if clinically indicated. Electronically Signed   By: Anner Crete M.D.   On: 08/10/2016 23:50   Ct Abdomen Pelvis W Contrast  Result Date: 08/11/2016 CLINICAL DATA:  Abdominal pain, vomiting and diarrhea. EXAM: CT ABDOMEN AND PELVIS WITH  CONTRAST TECHNIQUE: Multidetector CT imaging of the abdomen and pelvis was performed using the standard protocol following bolus administration of intravenous contrast. CONTRAST:  119mL ISOVUE-300 IOPAMIDOL (ISOVUE-300) INJECTION 61% COMPARISON:  CT abdomen 07/30/2015.  CTA 09/15/2010 FINDINGS: Lower chest: Mild atelectasis in both lower lobes. No pleural effusion. Hepatobiliary: No focal hepatic lesion. Mild gallbladder distention with layering hyperdensity, stones versus sludge. No evidence of pericholecystic inflammation. No biliary dilatation. Pancreas: No ductal dilatation or inflammation. Spleen: Small enhancing splenic lesions are again seen, some which are not as well visualized on the current exam. No new focal abnormality. Adrenals/Urinary Tract: Unchanged 2 cm left adrenal nodule. The right adrenal gland is normal. No hydronephrosis or perinephric edema. Small cortical hypodensity in the upper left kidney is unchanged from prior. Subcentimeter right renal cortical hypodensities are too small to characterize. There is delayed renal excretion bilaterally urinary bladder is minimally distended with wall thickening. There is air in the bladder. Mild perivesicular soft tissue stranding. Stomach/Bowel: The rectum is distended with stool spanning 7.6 cm. There is rectal wall thickening and surrounding soft tissue edema. Diffuse colonic tortuosity. Small to moderate stool in the more proximal colon. No additional colonic wall thickening. No small bowel dilatation or inflammation. The stomach is decompressed. The appendix is not confidently visualized, no right lower quadrant or pericecal inflammation. Vascular/Lymphatic: Normal caliber abdominal aorta. No abdominal or  pelvic adenopathy. Reproductive: Post hysterectomy. There is air in the vagina. No adnexal mass. Other: No ascites or free air. Laxity of the anterior abdominal wall without frank hernia. Musculoskeletal: Chronic L3 compression fracture with  augmentation. Chronic prominent Schmorl's nodes within superior and inferior endplates of L1. Multilevel degenerative change throughout the lumbar spine. Intramedullary rod in the right femur is partially included. IMPRESSION: 1. Rectal distention with wall thickening and surrounding soft tissue edema suspicious for fecal impaction. Colonic tortuosity with moderate colonic stool burden, likely chronic constipation. No bowel obstruction. 2. Bladder wall thickening with mild perivesicular edema. This is likely secondary to cystitis. There is air in the urinary bladder, which may be iatrogenic, recommend correlation for catheterization attempts. Alternatively, emphysematous cystitis is considered, however no air in the bladder wall. Colovesicular fistula is not excluded. 3. Post hysterectomy. 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 4. Stones or sludge in the gallbladder without CT findings of cholecystitis. 5. Hyperenhancing splenic lesions are again seen, not as well seen compared to prior exam, possibly due to phase contrast. These are stable compared to 2012 CT suggesting hemangiomas, but nonspecific. 6. Stable left adrenal adenoma. Electronically Signed   By: Jeb Levering M.D.   On: 08/11/2016 00:07   Dg Chest Portable 1 View  Result Date: 08/10/2016 CLINICAL DATA:  Initial evaluation for acute altered mental status. EXAM: PORTABLE CHEST 1 VIEW COMPARISON:  The prior radiograph from 07/09/2016 FINDINGS: Mild cardiomegaly, stable. Mediastinal silhouette within normal limits. Lungs are hypoinflated. Patchy bibasilar opacities favored to reflect atelectasis/bronchovascular crowding, although superimposed infiltrates could be considered in the correct clinical setting. No pulmonary edema or pleural effusion. No pneumothorax. No acute osseous abnormality. IMPRESSION: Shallow lung inflation with patchy bibasilar opacities, left greater than right. Atelectasis/bronchovascular crowding  is favored, although superimposed infiltrates could be considered in the correct clinical setting. Electronically Signed   By: Jeannine Boga M.D.   On: 08/10/2016 23:04    Micro Results     Recent Results (from the past 240 hour(s))  Blood culture (routine x 2)     Status: None (Preliminary result)   Collection Time: 08/10/16 10:09 PM  Result Value Ref Range Status   Specimen Description BLOOD LEFT HAND  Final   Special Requests IN PEDIATRIC BOTTLE Blood Culture adequate volume  Final   Culture   Final    NO GROWTH 3 DAYS Performed at [facility], [place] [address] [place], [place] [address]    Report Status PENDING  Incomplete  Blood culture (routine x 2)     Status: None (Preliminary result)   Collection Time: 08/10/16 11:31 PM  Result Value Ref Range Status   Specimen Description BLOOD LEFT HAND  Final   Special Requests IN PEDIATRIC BOTTLE Blood Culture adequate volume  Final   Culture  Setup Time   Final    GRAM POSITIVE COCCI IN CLUSTERS IN PEDIATRIC BOTTLE CRITICAL RESULT CALLED TO, READ BACK BY AND VERIFIED WITH: Nani Skillern [id number] MLM Performed at [facility], [place] [address] [place], [place] [address]    Culture GRAM POSITIVE COCCI  Final   Report Status PENDING  Incomplete  Blood Culture ID Panel (Reflexed)     Status: Abnormal   Collection Time: 08/10/16 11:31 PM  Result Value Ref Range Status   Enterococcus species NOT DETECTED NOT DETECTED Final   Listeria monocytogenes NOT DETECTED NOT DETECTED Final   Staphylococcus species DETECTED (A) NOT DETECTED Final  Comment: Methicillin (oxacillin) resistant coagulase negative staphylococcus. Possible blood culture contaminant (unless isolated from more than one blood culture draw or clinical case suggests pathogenicity). No antibiotic treatment is indicated for blood  culture contaminants. CRITICAL RESULT CALLED TO, READ BACK BY AND VERIFIED WITH: PHARMD N Cos Cob [id number] MLM     Staphylococcus aureus NOT DETECTED NOT DETECTED Final   Methicillin resistance DETECTED (A) NOT DETECTED Final    Comment: CRITICAL RESULT CALLED TO, READ BACK BY AND VERIFIED WITH: PHARMD N GLOGOVAC [id number] MLM    Streptococcus species NOT DETECTED NOT DETECTED Final   Streptococcus agalactiae NOT DETECTED NOT DETECTED Final   Streptococcus pneumoniae NOT DETECTED NOT DETECTED Final   Streptococcus pyogenes NOT DETECTED NOT DETECTED Final   Acinetobacter baumannii NOT DETECTED NOT DETECTED Final   Enterobacteriaceae species NOT DETECTED NOT DETECTED Final   Enterobacter cloacae complex NOT DETECTED NOT DETECTED Final   Escherichia coli NOT DETECTED NOT DETECTED Final   Klebsiella oxytoca NOT DETECTED NOT DETECTED Final   Klebsiella pneumoniae NOT DETECTED NOT DETECTED Final   Proteus species NOT DETECTED NOT DETECTED Final   Serratia marcescens NOT DETECTED NOT DETECTED Final   Haemophilus influenzae NOT DETECTED NOT DETECTED Final   Neisseria meningitidis NOT DETECTED NOT DETECTED Final   Pseudomonas aeruginosa NOT DETECTED NOT DETECTED Final   Candida albicans NOT DETECTED NOT DETECTED Final   Candida glabrata NOT DETECTED NOT DETECTED Final   Candida krusei NOT DETECTED NOT DETECTED Final   Candida parapsilosis NOT DETECTED NOT DETECTED Final   Candida tropicalis NOT DETECTED NOT DETECTED Final    Comment: Performed at [facility], [place]. [address], [place], [place] [address]  Urine culture     Status: Abnormal (Preliminary result)   Collection Time: 08/11/16  1:23 AM  Result Value Ref Range Status   Specimen Description URINE, CLEAN CATCH  Final   Special Requests Normal  Final   Culture >=100,000 COLONIES/mL ESCHERICHIA COLI (A)  Final   Report Status PENDING  Incomplete  MRSA PCR Screening     Status: None   Collection Time: 08/11/16  7:30 AM  Result Value Ref Range Status   MRSA by PCR NEGATIVE NEGATIVE Final    Comment:        The GeneXpert MRSA Assay  (FDA approved for NASAL specimens only), is one component of a comprehensive MRSA colonization surveillance program. It is not intended to diagnose MRSA infection nor to guide or monitor treatment for MRSA infections.     Today   Subjective    [employer] today has no headache,no chest abdominal pain,no new weakness tingling or numbness, feels much better     Objective   Blood pressure (!) 171/94, pulse 71, temperature 98.2 F (36.8 C), temperature source Oral, resp. rate 17, weight 91.8 kg (202 lb 4.8 oz), SpO2 99 %.   Intake/Output Summary (Last 24 hours) at 08/13/16 1019 Last data filed at 08/13/16 0452  Gross per 24 hour  Intake              650 ml  Output                0 ml  Net              650 ml    Exam Awake Alert, Oriented x 3, No new F.N deficits, Normal affect [place].AT,PERRAL Supple Neck,No JVD, No cervical lymphadenopathy appriciated.  Symmetrical Chest wall movement, Good air movement bilaterally, CTAB RRR,No Gallops,Rubs  or new Murmurs, No Parasternal Heave +ve B.Sounds, Abd Soft, Non tender, No organomegaly appriciated, No rebound -guarding or rigidity. No Cyanosis, Clubbing or edema, No new Rash or bruise   Data Review   CBC w Diff: Lab Results  Component Value Date   WBC 7.1 08/12/2016   HGB 11.0 (L) 08/12/2016   HCT 34.6 (L) 08/12/2016   PLT 259 08/12/2016   LYMPHOPCT 16 07/10/2016   MONOPCT 4 07/10/2016   EOSPCT 0 07/10/2016   BASOPCT 0 07/10/2016    CMP: Lab Results  Component Value Date   NA 137 08/13/2016   K 3.6 08/13/2016   CL 102 08/13/2016   CO2 24 08/13/2016   BUN 17 08/13/2016   CREATININE 0.78 08/13/2016   PROT 7.3 08/11/2016   ALBUMIN 3.6 08/11/2016   BILITOT 1.0 08/11/2016   ALKPHOS 104 08/11/2016   AST 16 08/11/2016   ALT 12 (L) 08/11/2016  .   Total Time in preparing paper work, data evaluation and todays exam - 62 minutes  Lala Lund M.D on 08/13/2016 at 10:19 AM  [facility]   Office   [phone]

## 2016-08-13 NOTE — Progress Notes (Addendum)
Transferred to 1540 while awaiting PTAR pickup. No changes in assessment. .Meagan Ancona, [employer]

## 2016-08-13 NOTE — Plan of Care (Signed)
Problem: Safety: Goal: Ability to remain free from injury will improve Outcome: Adequate for Discharge To [facility] SNF  Problem: Health Behavior/Discharge Planning: Goal: Ability to manage health-related needs will improve Outcome: Adequate for Discharge To [facility] SNF  Problem: Physical Regulation: Goal: Will remain free from infection Outcome: Adequate for Discharge To [facility] SN[facility]  Problem: Skin Integrity: Goal: Risk for impaired skin integrity will decrease Outcome: Adequate for Discharge To [facility] SNF  Problem: Tissue Perfusion: Goal: Risk factors for ineffective tissue perfusion will decrease Outcome: Adequate for Discharge To [facility] SNF  Problem: Activity: Goal: Risk for activity intolerance will decrease Outcome: Adequate for Discharge To [facility]

## 2016-08-13 NOTE — Discharge Instructions (Signed)
Follow with Primary MD Michelle Battles, MD in 7 days   Get CBC, CMP, 2 view Chest X ray checked  by Primary MD or SNF MD in 5-7 days ( we routinely change or add medications that can affect your baseline labs and fluid status, therefore we recommend that you get the mentioned basic workup next visit with your PCP, your PCP may decide not to get them or add new tests based on their clinical decision)  Activity: As tolerated with Full fall precautions use walker/cane & assistance as needed  Disposition SNF    Diet:   Diet Carb Modified Fluid / Heart Healthy  Accuchecks 4 times/day, Once in AM empty stomach and then before each meal. Log in all results and show them to your Prim.MD in 3 days. If any glucose reading is under 80 or above 300 call your Prim MD immidiately. Follow Low glucose instructions for glucose under 80 as instructed.   For Heart failure patients - Check your Weight same time everyday, if you gain over 2 pounds, or you develop in leg swelling, experience more shortness of breath or chest pain, call your Primary MD immediately. Follow Cardiac Low Salt Diet and 1.5 lit/day fluid restriction.  On your next visit with your primary care physician please Get Medicines reviewed and adjusted.  Please request your Prim.MD to go over all Hospital Tests and Procedure/Radiological results at the follow up, please get all Hospital records sent to your Prim MD by signing hospital release before you go home.  If you experience worsening of your admission symptoms, develop shortness of breath, life threatening emergency, suicidal or homicidal thoughts you must seek medical attention immediately by calling 911 or calling your MD immediately  if symptoms less severe.  You Must read complete instructions/literature along with all the possible adverse reactions/side effects for all the Medicines you take and that have been prescribed to you. Take any new Medicines after you have completely  understood and accpet all the possible adverse reactions/side effects.   Do not drive, operate heavy machinery, perform activities at heights, swimming or participation in water activities or provide baby sitting services if your were admitted for syncope or siezures until you have seen by Primary MD or a Neurologist and advised to do so again.  Do not drive when taking Pain medications.    Do not take more than prescribed Pain, Sleep and Anxiety Medications  Special Instructions: If you have smoked or chewed Tobacco  in the last 2 yrs please stop smoking, stop any regular Alcohol  and or any Recreational drug use.  Wear Seat belts while driving.   Please note  You were cared for by a hospitalist during your hospital stay. If you have any questions about your discharge medications or the care you received while you were in the hospital after you are discharged, you can call the unit and asked to speak with the hospitalist on call if the hospitalist that took care of you is not available. Once you are discharged, your primary care physician will handle any further medical issues. Please note that NO REFILLS for any discharge medications will be authorized once you are discharged, as it is imperative that you return to your primary care physician (or establish a relationship with a primary care physician if you do not have one) for your aftercare needs so that they can reassess your need for medications and monitor your lab values.

## 2016-08-13 NOTE — Progress Notes (Signed)
Patient is set to discharge back to [facility] today. Patient & daughter, [name] at bedside made aware. Discharge packet given to RN, Lovena Le. [facility] called for transport to pickup at 1:30pm per SNF request.     Raynaldo Opitz, [place] Worker cell #: [phone]

## 2016-08-13 NOTE — Progress Notes (Signed)
Report called to [facility] @ [facility]. Pt discharged from floor via stretcher for transport to [facility] by ambulance. Belongings, EMT & daughter with pt. No changes in assessment. Dariona Postma, [employer]

## 2016-08-14 LAB — CULTURE, BLOOD (ROUTINE X 2): Special Requests: ADEQUATE

## 2016-08-15 DIAGNOSIS — E119 Type 2 diabetes mellitus without complications: Secondary | ICD-10-CM | POA: Diagnosis not present

## 2016-08-15 DIAGNOSIS — N390 Urinary tract infection, site not specified: Secondary | ICD-10-CM | POA: Diagnosis not present

## 2016-08-15 DIAGNOSIS — K5900 Constipation, unspecified: Secondary | ICD-10-CM | POA: Diagnosis not present

## 2016-08-15 DIAGNOSIS — I10 Essential (primary) hypertension: Secondary | ICD-10-CM | POA: Diagnosis not present

## 2016-08-15 LAB — CULTURE, BLOOD (ROUTINE X 2)
CULTURE: NO GROWTH
Special Requests: ADEQUATE

## 2016-08-17 DIAGNOSIS — F322 Major depressive disorder, single episode, severe without psychotic features: Secondary | ICD-10-CM | POA: Diagnosis not present

## 2016-08-17 DIAGNOSIS — I10 Essential (primary) hypertension: Secondary | ICD-10-CM | POA: Diagnosis not present

## 2016-08-17 DIAGNOSIS — A419 Sepsis, unspecified organism: Secondary | ICD-10-CM | POA: Diagnosis not present

## 2016-08-17 DIAGNOSIS — M1990 Unspecified osteoarthritis, unspecified site: Secondary | ICD-10-CM | POA: Diagnosis not present

## 2016-08-17 DIAGNOSIS — G8929 Other chronic pain: Secondary | ICD-10-CM | POA: Diagnosis not present

## 2016-08-17 DIAGNOSIS — F0390 Unspecified dementia without behavioral disturbance: Secondary | ICD-10-CM | POA: Diagnosis not present

## 2016-08-17 DIAGNOSIS — N179 Acute kidney failure, unspecified: Secondary | ICD-10-CM | POA: Diagnosis not present

## 2016-08-17 DIAGNOSIS — K219 Gastro-esophageal reflux disease without esophagitis: Secondary | ICD-10-CM | POA: Diagnosis not present

## 2016-08-17 DIAGNOSIS — N390 Urinary tract infection, site not specified: Secondary | ICD-10-CM | POA: Diagnosis not present

## 2016-08-18 DIAGNOSIS — F4323 Adjustment disorder with mixed anxiety and depressed mood: Secondary | ICD-10-CM | POA: Diagnosis not present

## 2016-08-19 IMAGING — DX DG CHEST 1V PORT
1 series · 1 of 1 positions shown · non-contrast
Comparison: 06/30/2014.

CLINICAL DATA: Atelectasis.

EXAM:
PORTABLE CHEST - 1 VIEW

[chest ap]
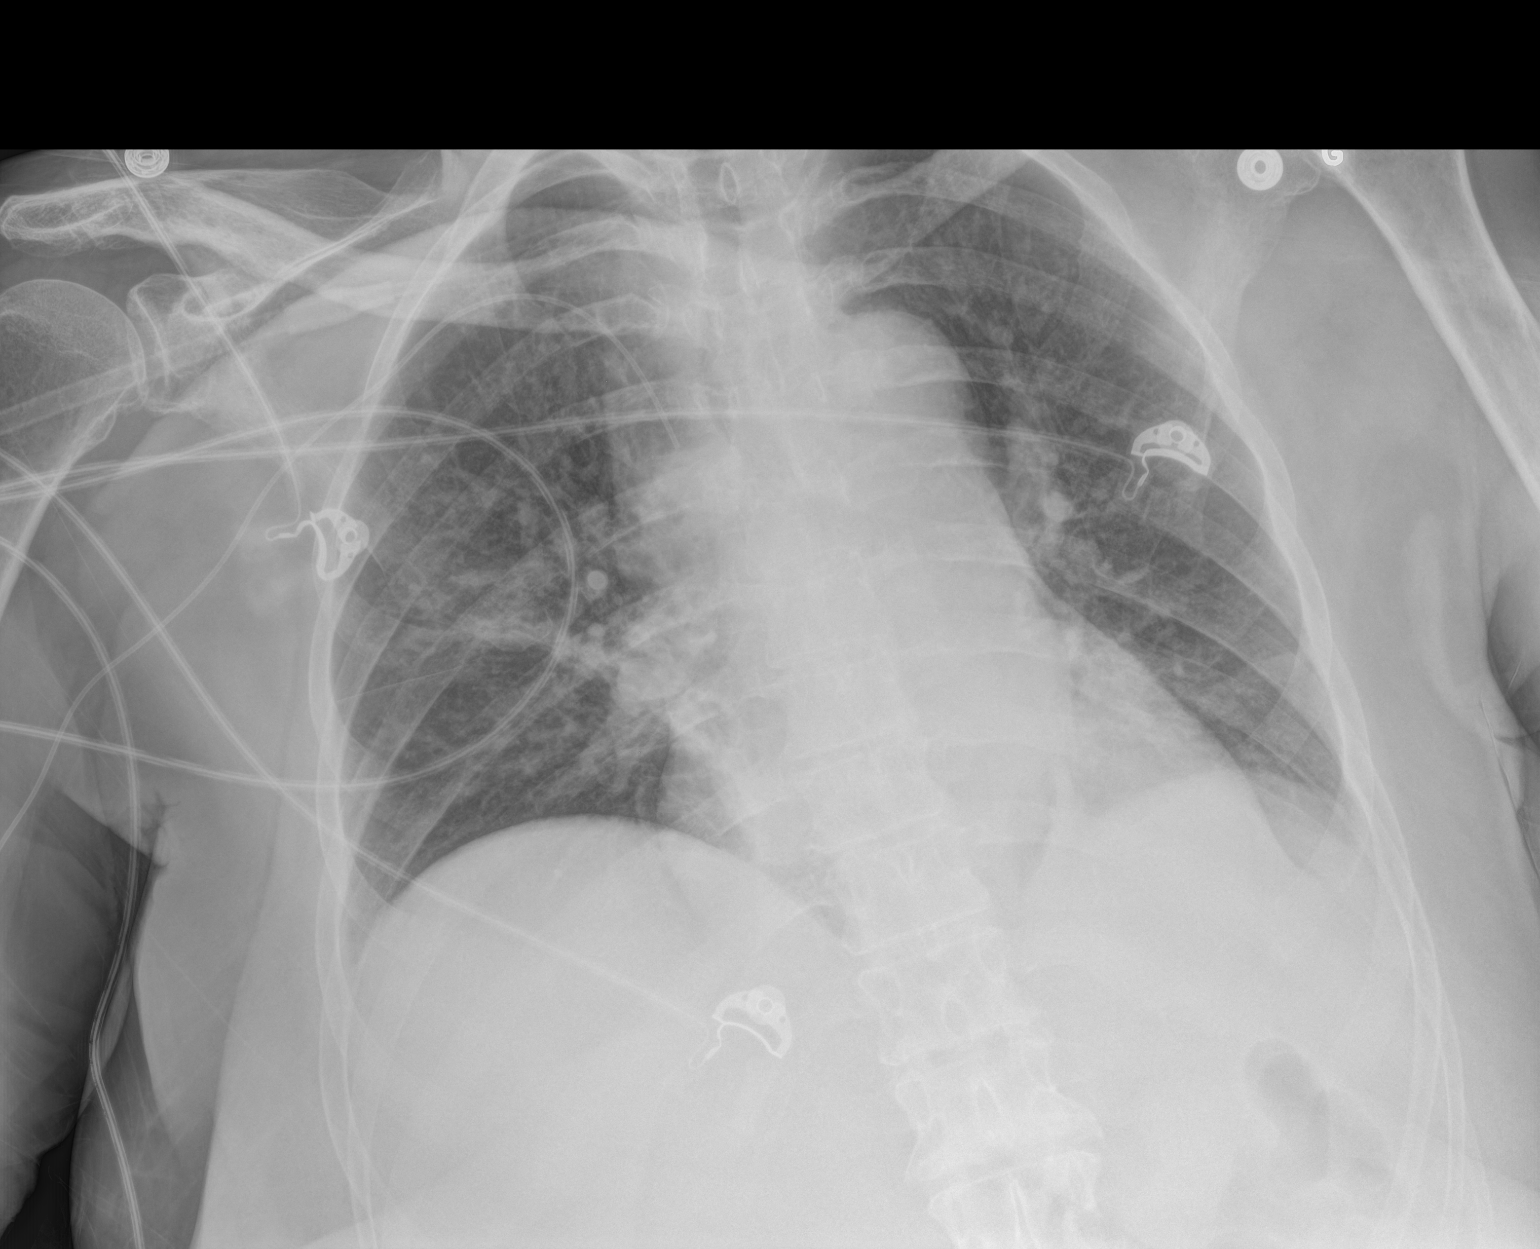

[1 of 1 positions shown; findings below may reference images not displayed]

FINDINGS: Interim removal of endotracheal tube. Right PICC line in stable
position. Mild fullness of the mediastinum noted, this is most
likely related to tortuous thoracic aorta and prominent great
vessels as well as AP portable technique . Cardiomegaly with mild
pulmonary vascular prominence interstitial prominence suggesting
mild congestive heart failure. Bibasilar subsegmental atelectasis.
Tiny bilateral pleural effusions cannot be excluded. No
pneumothorax.
IMPRESSION: 1. Right PICC line in stable position.
2. Cardiomegaly with mild pulmonary vascular prominence and mild
interstitial prominence suggesting mild congestive heart failure.
Tiny bilateral pleural effusions cannot be excluded.
3. New onset of subsegmental atelectasis both lung bases.

## 2016-08-30 DIAGNOSIS — S72401D Unspecified fracture of lower end of right femur, subsequent encounter for closed fracture with routine healing: Secondary | ICD-10-CM | POA: Diagnosis not present

## 2016-09-07 DIAGNOSIS — D500 Iron deficiency anemia secondary to blood loss (chronic): Secondary | ICD-10-CM | POA: Diagnosis not present

## 2016-09-07 DIAGNOSIS — F418 Other specified anxiety disorders: Secondary | ICD-10-CM | POA: Diagnosis not present

## 2016-09-07 DIAGNOSIS — I10 Essential (primary) hypertension: Secondary | ICD-10-CM | POA: Diagnosis not present

## 2016-09-07 DIAGNOSIS — E119 Type 2 diabetes mellitus without complications: Secondary | ICD-10-CM | POA: Diagnosis not present

## 2016-09-10 ENCOUNTER — Inpatient Hospital Stay (HOSPITAL_COMMUNITY)
Admission: EM | Admit: 2016-09-10 | Discharge: 2016-09-12 | DRG: 137 | Disposition: A | Discharge status: Skilled Nursing Facility | Payer: Medicare Other | Attending: Internal Medicine | Admitting: Internal Medicine

## 2016-09-10 ENCOUNTER — Emergency Department (HOSPITAL_COMMUNITY): Payer: Medicare Other

## 2016-09-10 ENCOUNTER — Encounter (HOSPITAL_COMMUNITY): Payer: Self-pay | Admitting: Emergency Medicine

## 2016-09-10 DIAGNOSIS — N3001 Acute cystitis with hematuria: Secondary | ICD-10-CM

## 2016-09-10 DIAGNOSIS — K047 Periapical abscess without sinus: Principal | ICD-10-CM | POA: Diagnosis present

## 2016-09-10 DIAGNOSIS — E119 Type 2 diabetes mellitus without complications: Secondary | ICD-10-CM

## 2016-09-10 DIAGNOSIS — I10 Essential (primary) hypertension: Secondary | ICD-10-CM | POA: Diagnosis present

## 2016-09-10 DIAGNOSIS — I160 Hypertensive urgency: Secondary | ICD-10-CM | POA: Diagnosis not present

## 2016-09-10 DIAGNOSIS — F341 Dysthymic disorder: Secondary | ICD-10-CM | POA: Diagnosis present

## 2016-09-10 DIAGNOSIS — Z66 Do not resuscitate: Secondary | ICD-10-CM | POA: Diagnosis present

## 2016-09-10 DIAGNOSIS — R410 Disorientation, unspecified: Secondary | ICD-10-CM | POA: Diagnosis not present

## 2016-09-10 DIAGNOSIS — Z885 Allergy status to narcotic agent status: Secondary | ICD-10-CM

## 2016-09-10 DIAGNOSIS — F0390 Unspecified dementia without behavioral disturbance: Secondary | ICD-10-CM | POA: Diagnosis present

## 2016-09-10 DIAGNOSIS — Z794 Long term (current) use of insulin: Secondary | ICD-10-CM

## 2016-09-10 DIAGNOSIS — K219 Gastro-esophageal reflux disease without esophagitis: Secondary | ICD-10-CM | POA: Diagnosis present

## 2016-09-10 DIAGNOSIS — B9689 Other specified bacterial agents as the cause of diseases classified elsewhere: Secondary | ICD-10-CM | POA: Diagnosis not present

## 2016-09-10 DIAGNOSIS — Z882 Allergy status to sulfonamides status: Secondary | ICD-10-CM

## 2016-09-10 DIAGNOSIS — N179 Acute kidney failure, unspecified: Secondary | ICD-10-CM | POA: Diagnosis not present

## 2016-09-10 DIAGNOSIS — F419 Anxiety disorder, unspecified: Secondary | ICD-10-CM | POA: Diagnosis present

## 2016-09-10 DIAGNOSIS — Z96622 Presence of left artificial elbow joint: Secondary | ICD-10-CM | POA: Diagnosis present

## 2016-09-10 DIAGNOSIS — Z7982 Long term (current) use of aspirin: Secondary | ICD-10-CM

## 2016-09-10 DIAGNOSIS — G9340 Encephalopathy, unspecified: Secondary | ICD-10-CM | POA: Diagnosis present

## 2016-09-10 DIAGNOSIS — Z96651 Presence of right artificial knee joint: Secondary | ICD-10-CM | POA: Diagnosis present

## 2016-09-10 DIAGNOSIS — Z79899 Other long term (current) drug therapy: Secondary | ICD-10-CM

## 2016-09-10 DIAGNOSIS — R4182 Altered mental status, unspecified: Secondary | ICD-10-CM | POA: Diagnosis not present

## 2016-09-10 DIAGNOSIS — E876 Hypokalemia: Secondary | ICD-10-CM | POA: Diagnosis present

## 2016-09-10 DIAGNOSIS — R509 Fever, unspecified: Secondary | ICD-10-CM | POA: Diagnosis not present

## 2016-09-10 LAB — CBC
HEMATOCRIT: 41.9 % (ref 36.0–46.0)
HEMOGLOBIN: 14.4 g/dL (ref 12.0–15.0)
MCH: 27.6 pg (ref 26.0–34.0)
MCHC: 34.4 g/dL (ref 30.0–36.0)
MCV: 80.4 fL (ref 78.0–100.0)
Platelets: 314 10*3/uL (ref 150–400)
RBC: 5.21 MIL/uL — AB (ref 3.87–5.11)
RDW: 14.7 % (ref 11.5–15.5)
WBC: 15.2 10*3/uL — AB (ref 4.0–10.5)

## 2016-09-10 LAB — COMPREHENSIVE METABOLIC PANEL
ALT: 11 U/L — ABNORMAL LOW (ref 14–54)
ANION GAP: 13 (ref 5–15)
AST: 14 U/L — ABNORMAL LOW (ref 15–41)
Albumin: 3.8 g/dL (ref 3.5–5.0)
Alkaline Phosphatase: 85 U/L (ref 38–126)
BILIRUBIN TOTAL: 0.9 mg/dL (ref 0.3–1.2)
BUN: 27 mg/dL — AB (ref 6–20)
CO2: 25 mmol/L (ref 22–32)
Calcium: 9.6 mg/dL (ref 8.9–10.3)
Chloride: 103 mmol/L (ref 101–111)
Creatinine, Ser: 1.38 mg/dL — ABNORMAL HIGH (ref 0.44–1.00)
GFR, EST AFRICAN AMERICAN: 41 mL/min — AB (ref >60)
GFR, EST NON AFRICAN AMERICAN: 36 mL/min — AB (ref >60)
Glucose, Bld: 408 mg/dL — ABNORMAL HIGH (ref 65–99)
POTASSIUM: 3.4 mmol/L — AB (ref 3.5–5.1)
Sodium: 141 mmol/L (ref 135–145)
TOTAL PROTEIN: 7.7 g/dL (ref 6.5–8.1)

## 2016-09-10 LAB — I-STAT CG4 LACTIC ACID, ED: Lactic Acid, Venous: 1.88 mmol/L (ref 0.5–1.9)

## 2016-09-10 LAB — CBG MONITORING, ED: Glucose-Capillary: 386 mg/dL — ABNORMAL HIGH (ref 65–99)

## 2016-09-10 MED ORDER — SODIUM CHLORIDE 0.9 % IV BOLUS (SEPSIS)
1000.0000 mL | Freq: Once | INTRAVENOUS | Status: AC
  Administered 2016-09-10: 1000 mL via INTRAVENOUS

## 2016-09-10 MED ORDER — BUPIVACAINE-EPINEPHRINE (PF) 0.5% -1:200000 IJ SOLN
1.8000 mL | Freq: Once | INTRAMUSCULAR | Status: AC
  Administered 2016-09-10: 1.8 mL
  Filled 2016-09-10: qty 1.8

## 2016-09-10 NOTE — ED Triage Notes (Signed)
Brought in by EMS from [facility] facility with c/o altered mental status.  Pt is reported to be "normally altered" but today, pt was observed by daughter "more altered".  Pt's CBG by EMS was 289.

## 2016-09-10 NOTE — ED Provider Notes (Signed)
[facility] DEPT Provider Note   CSN: [id number] Arrival date & time: 09/10/16  2230  By signing my name below, I, Dora Sims, attest that this documentation has been prepared under the direction and in the presence of physician practitioner, Deno Etienne, DO. Electronically Signed: Dora Sims, Scribe. 09/10/2016. 11:33 PM.  History   Chief Complaint Chief Complaint  Patient presents with  . Altered Mental Status   The history is provided by a relative. No language interpreter was used.  Altered Mental Status   This is a chronic (but acutely worse) problem. The current episode started 12 to 24 hours ago. The problem has not changed since onset.Associated symptoms include unresponsiveness. Her past medical history is significant for diabetes, hypertension, depression and dementia.    HPI Comments: LEVEL 5 CAVEAT FOR ALTERED MENTAL STATUS [name] is a 76 y.o. female with PMHx including dementia and DM2 who presents to the Emergency Department via EMS for evaluation of acute altered mental status today. Daughter states that patient is normally altered secondary to dementia but has been more altered than usual today. She is typically responsive but has been less responsive over the course of the day. She has had a recent cough productive of sputum and had one episode of post-tussive emesis one hour ago. Per daughter, patient has been "jerking around" often today and cannot appear to get comfortable. Patient has been similarly altered when she had a UTI in the past but she has no known urinary complaints. Daughter states that patient has developed left-sided jaw swelling and some bruising to her left shoulder today without known causes. No known head trauma or syncope. Per daughter, patient has explicitly denies any pain today. Daughter denies any other complaints at this time. Patient [facility] at [facility].  Past Medical History:  Diagnosis Date  .  Anxiety   . Depression   . Diabetes mellitus   . Dyspnea   . GERD (gastroesophageal reflux disease)   . Headache(784.0)   . HTN (hypertension)    dr Johnsie Cancel  . Hypercholesteremia    DENIES  . Osteoarthritis   . PONV (postoperative nausea and vomiting)   . RLS (restless legs syndrome)   . Stress     Patient Active Problem List   Diagnosis Date Noted  . 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 Acute post-hemorrhagic anemia 07/09/2016  . Symptomatic anemia 07/09/2016  . 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 Abdominal pain, chronic, epigastric 08/10/2015  . Dehydration   . Leukocytosis   . Aspiration pneumonia ([place]) 07/21/2014  . DM type 2 (diabetes mellitus, type 2) ([place]) 07/21/2014  . Palliative care encounter 07/21/2014  . Dysphagia 07/09/2014  . Decubitus ulcer 07/09/2014  . Blood poisoning   . Acute respiratory failure with hypoxia ([place]) 06/30/2014  . Septic shock ([place]) 06/30/2014  . Lactic acidosis 06/30/2014  . Dementia 06/30/2014  . Acute respiratory failure with hypoxemia ([place])   . Hypernatremia   . Acute kidney injury ([place]) 05/28/2014  . Fever 05/27/2014  . Lower urinary tract infectious disease 05/27/2014  . Sepsis ([place]) 05/27/2014  . Acute encephalopathy   . FTT (failure to thrive) in adult   . Gait instability 07/18/2013    Class: Acute  . Low back pain 07/18/2013    Class: Acute  . Nausea with vomiting 07/18/2013    Class: Acute  . Abnormality of gait 04/29/2013  . 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 Head trauma  06/03/2010  . HYPERCHOLESTEROLEMIA 05/15/2008  . ANXIETY DEPRESSION 05/15/2008  . DEPRESSION 05/15/2008  . Essential hypertension 05/15/2008  . OSTEOARTHRITIS 05/15/2008  . Edema 05/15/2008  . DYSPNEA ON EXERTION 05/15/2008  . ELECTROCARDIOGRAM, ABNORMAL 05/15/2008    Past Surgical History:  Procedure Laterality Date  . ABDOMINAL HYSTERECTOMY    .  COLONOSCOPY    . JOINT REPLACEMENT    . left forearm fracture with ORIF Left   . LUMBAR LAMINECTOMY/DECOMPRESSION MICRODISCECTOMY N/A 04/18/2012   Procedure: LUMBAR LAMINECTOMY/DECOMPRESSION MICRODISCECTOMY 3 LEVELS;  Surgeon: Winfield Cunas, MD;  Location: [facility] ORS;  Service: Neurosurgery;  Laterality: N/A;  Lumbar three-four, lumbar four-five, lumbar five-sacral one decompression. Synovial cyst resection lumbar four-five  . ORIF PERIPROSTHETIC FRACTURE Right 07/06/2016   Procedure: OPEN REDUCTION INTERNAL FIXATION (ORIF) PERIPROSTHETIC FRACTURE;  Surgeon: Gaynelle Arabian, MD;  Location: [facility] ORS;  Service: Orthopedics;  Laterality: Right;  . REPLACEMENT TOTAL KNEE Right   . WISDOM TOOTH EXTRACTION      OB History    No data available       Home Medications    Prior to Admission medications   Medication Sig Start Date End Date Taking? Authorizing Provider  acetaminophen (TYLENOL) 325 MG tablet Take 650 mg by mouth 2 (two) times daily.    [provider]  acetaminophen (TYLENOL) 500 MG tablet Take 500 mg by mouth every 8 (eight) hours as needed for mild pain.    [provider]  ALPRAZolam Duanne Moron) 0.25 MG tablet Take 1 tablet (0.25 mg total) by mouth 2 (two) times daily. 08/13/16   Thurnell Lose, MD  amLODipine (NORVASC) 5 MG tablet Take 1 tablet (5 mg total) by mouth daily. 07/14/14   Ghimire, Henreitta Leber, MD  aspirin 81 MG chewable tablet Chew 81 mg by mouth daily.    [provider]  bisacodyl (DULCOLAX) 10 MG suppository Place 1 suppository (10 mg total) rectally daily as needed for moderate constipation. 07/08/16   Perkins, Alexzandrew L, PA-C  cefpodoxime (VANTIN) 200 MG tablet Take 1 tablet (200 mg total) by mouth 2 (two) times daily. 2 more days 08/13/16   Thurnell Lose, MD  docusate sodium (COLACE) 100 MG capsule Take 100 mg by mouth 2 (two) times daily.    [provider]  DULoxetine (CYMBALTA) 30 MG capsule Take 30 mg by mouth 2 (two) times  daily.    [provider]  insulin aspart (NOVOLOG) 100 UNIT/ML injection Inject 2-11 Units into the skin 3 (three) times daily before meals. Pt uses per sliding scale:    101-150:  2 units 151-200:  3 units  201-250:  5 units  251-300:  7 units  301-350:  9 units  Greater than 350:  11 units Greater than 400 or less than 60:  Call MD    [provider]  Insulin Detemir (LEVEMIR FLEXTOUCH) 100 UNIT/ML Pen Inject 8-12 Units into the skin 2 (two) times daily. Pt uses 12 units in the morning and 8 units at bedtime.    [provider]  loratadine (CLARITIN) 10 MG tablet Take 10 mg by mouth daily.     [provider]  losartan (COZAAR) 100 MG tablet Take 100 mg by mouth every evening.     [provider]  Magnesium Oxide 500 MG TABS Take 500 mg by mouth daily.     [provider]  methocarbamol (ROBAXIN) 500 MG tablet Take 1 tablet (500 mg total) by mouth every 6 (six) hours as  needed for muscle spasms. 07/08/16   Perkins, Alexzandrew L, PA-C  metoprolol succinate (TOPROL-XL) 50 MG 24 hr tablet Take 1 tablet (50 mg total) by mouth daily. 07/04/14   Robbie Lis, MD  Nutritional Supplements (NUTRITIONAL SUPPLEMENT PLUS) LIQD Take 120 mLs by mouth 2 (two) times daily. [facility]    [provider]  omeprazole (PRILOSEC) 40 MG capsule Take 40 mg by mouth daily.    [provider]  polyethylene glycol (MIRALAX / GLYCOLAX) packet Take 17 g by mouth daily. 07/26/13   [name], MD  potassium bicarbonate (K-LYTE) 25 MEQ disintegrating tablet Take 25 mEq by mouth daily.    [provider]  pramipexole (MIRAPEX) 0.25 MG tablet Take 1.5 mg by mouth 3 (three) times daily.     [provider]  traMADol (ULTRAM) 50 MG tablet Take 1 tablet (50 mg total) by mouth every 6 (six) hours as needed for moderate pain. 08/13/16   Thurnell Lose, MD    Family History Family History  Problem Relation Age of Onset  . Diabetes  Father   . High blood pressure Father   . High blood pressure Mother   . Colon cancer Neg Hx   . Esophageal cancer Neg Hx   . Liver disease Neg Hx   . Stomach cancer Neg Hx   . Pancreatic cancer Neg Hx     Social History Social History  Substance Use Topics  . Smoking status: Never Smoker  . Smokeless tobacco: Never Used  . Alcohol use No     Allergies   Morphine and related; Oxycodone; and Sulfonamide derivatives   Review of Systems Review of Systems  Unable to perform ROS: Mental status change   Physical Exam Updated Vital Signs BP (!) 155/106   Temp (S) (!) 100.6 F (38.1 C) (Rectal)   Resp 18   Ht 5\' 7"  (1.702 m)   Wt 95.3 kg (210 lb)   SpO2 99%   BMI 32.89 kg/m   Physical Exam  Constitutional: She appears well-developed and well-nourished. No distress.  HENT:  Head: Normocephalic and atraumatic.  Fluctuant area that is TTP along the lateral canine in the left upper jaw.  Eyes: Pupils are equal, round, and reactive to light. EOM are normal.  Neck: Normal range of motion. Neck supple.  Cardiovascular: Normal rate and regular rhythm.  Exam reveals no gallop and no friction rub.   No murmur heard. Pulmonary/Chest: Effort normal and breath sounds normal. She has no wheezes. She has no rales.  Clear lung sounds bilaterally.  Abdominal: Soft. She exhibits no distension. There is no tenderness.  Musculoskeletal: She exhibits no edema or tenderness.  No lower extremity edema.  Neurological: She is alert.  Non-verbal. Responds to pain. Opens eyes spontaneously.  Skin: Skin is warm and dry. She is not diaphoretic.  Psychiatric: She has a normal mood and affect. Her behavior is normal.  Nursing note and vitals reviewed.  ED Treatments / Results  Labs (all labs ordered are listed, but only abnormal results are displayed) Labs Reviewed  COMPREHENSIVE METABOLIC PANEL - Abnormal; Notable for the following:       Result Value   Potassium 3.4 (*)    Glucose, Bld  408 (*)    BUN 27 (*)    Creatinine, Ser 1.38 (*)    AST 14 (*)    ALT 11 (*)    GFR calc non Af Amer 36 (*)    GFR calc Af Amer 41 (*)  All other components within normal limits  CBC - Abnormal; Notable for the following:    WBC 15.2 (*)    RBC 5.21 (*)    All other components within normal limits  URINALYSIS, ROUTINE W REFLEX MICROSCOPIC - Abnormal; Notable for the following:    Color, Urine AMBER (*)    APPearance HAZY (*)    Glucose, UA >=500 (*)    Hgb urine dipstick SMALL (*)    Bilirubin Urine SMALL (*)    Ketones, ur 5 (*)    Protein, ur >=300 (*)    Bacteria, UA MANY (*)    Squamous Epithelial / LPF 0-5 (*)    All other components within normal limits  CBG MONITORING, ED - Abnormal; Notable for the following:    Glucose-Capillary 386 (*)    All other components within normal limits  I-STAT CG4 LACTIC ACID, ED    EKG  EKG Interpretation None       Radiology Dg Chest 2 View  Result Date: 09/10/2016 CLINICAL DATA:  Altered mental status, fever EXAM: CHEST  2 VIEW COMPARISON:  08/10/2016 FINDINGS: No focal pulmonary infiltrate, consolidation, or pleural effusion. Stable cardiomediastinal silhouette. No pneumothorax. IMPRESSION: No acute infiltrate or edema. Electronically Signed   By: Donavan Foil M.D.   On: 09/10/2016 23:13    Procedures .Marland KitchenIncision and Drainage Date/Time: 09/11/2016 12:25 AM Performed by: Tyrone Nine Anacleto Batterman Authorized by: Deno Etienne   Consent:    Consent obtained:  Verbal   Consent given by:  Guardian   Risks discussed:  Bleeding, incomplete drainage, infection and damage to other organs   Alternatives discussed:  No treatment, delayed treatment and alternative treatment Location:    Type:  Abscess   Location:  Mouth   Mouth location: dental. Anesthesia (see MAR for exact dosages):    Anesthesia method:  Local infiltration   Local anesthetic:  Bupivacaine 0.5% WITH epi Procedure type:    Complexity:  Complex Procedure details:    Needle  aspiration: no     Incision types:  Stab incision   Incision depth:  Submucosal   Wound management:  Probed and deloculated   Drainage:  Bloody and purulent   Drainage amount:  Moderate   Wound treatment:  Wound left open   Packing materials:  None Post-procedure details:    Patient tolerance of procedure:  Tolerated well, no immediate complications   (including critical care time)  DIAGNOSTIC STUDIES: Oxygen Saturation is 99% on RA, normal by my interpretation.    COORDINATION OF CARE: 11:30 PM Discussed treatment plan with pt's daughter at bedside and she agreed to plan.  Medications Ordered in ED Medications  cefTRIAXone (ROCEPHIN) 1 g in dextrose 5 % 50 mL IVPB (not administered)  insulin aspart (novoLOG) injection 10 Units (not administered)  sodium chloride 0.9 % bolus 1,000 mL (1,000 mLs Intravenous New Bag/Given 09/10/16 2328)  bupivacaine-epinephrine (MARCAINE W/ EPI) 0.5% -1:200000 injection 1.8 mL (1.8 mLs Infiltration Given 09/10/16 2351)     Initial Impression / Assessment and Plan / ED Course  I have reviewed the triage vital signs and the nursing notes.  Pertinent labs & imaging results that were available during my care of the patient were reviewed by me and considered in my medical decision making (see chart for details).     76 yo F With a chief complaint of fever and change in mentation. Per the family she's been having some mild coughing one episode of vomiting. She is not having some facial swelling.  She's been having more jerking and difficulty being comfortable. Has not been sleeping. On my exam there is a dental abscess. This is drained at bedside. She also has too numerous to count bacteria in her urine. Was febrile here in the ED. Has a mild acute kidney injury. We'll discuss with the hospitalist for admission.  The patients results and plan were reviewed and discussed.   Any x-rays performed were independently reviewed by myself.   Differential  diagnosis were considered with the presenting HPI.   The patients results and plan were reviewed and discussed.   Any x-rays performed were independently reviewed by myself.   Differential diagnosis were considered with the presenting HPI.  Medications  cefTRIAXone (ROCEPHIN) 1 g in dextrose 5 % 50 mL IVPB (not administered)  insulin aspart (novoLOG) injection 10 Units (not administered)  sodium chloride 0.9 % bolus 1,000 mL (1,000 mLs Intravenous New Bag/Given 09/10/16 2328)  bupivacaine-epinephrine (MARCAINE W/ EPI) 0.5% -1:200000 injection 1.8 mL (1.8 mLs Infiltration Given 09/10/16 2351)    Vitals:   09/10/16 2239 09/10/16 2245  BP:  (!) 155/106  Resp:  18  Temp:  (!) 100.6 F (38.1 C)  TempSrc:  (S) Rectal  SpO2:  99%  Weight: 95.3 kg (210 lb)   Height: 5\' 7"  (1.702 m)     Final diagnoses:  Disorientation  Dental abscess  Acute cystitis with hematuria    Admission/ observation were discussed with the admitting physician, patient and/or family and they are comfortable with the plan.   Medications  cefTRIAXone (ROCEPHIN) 1 g in dextrose 5 % 50 mL IVPB (not administered)  insulin aspart (novoLOG) injection 10 Units (not administered)  sodium chloride 0.9 % bolus 1,000 mL (1,000 mLs Intravenous New Bag/Given 09/10/16 2328)  bupivacaine-epinephrine (MARCAINE W/ EPI) 0.5% -1:200000 injection 1.8 mL (1.8 mLs Infiltration Given 09/10/16 2351)    Vitals:   09/10/16 2239 09/10/16 2245  BP:  (!) 155/106  Resp:  18  Temp:  (!) 100.6 F (38.1 C)  TempSrc:  (S) Rectal  SpO2:  99%  Weight: 95.3 kg (210 lb)   Height: 5\' 7"  (1.702 m)     Final diagnoses:  Disorientation  Dental abscess  Acute cystitis with hematuria    Admission/ observation were discussed with the admitting physician, patient and/or family and they are comfortable with the plan.    Final Clinical Impressions(s) / ED Diagnoses   Final diagnoses:  Disorientation  Dental abscess  Acute cystitis with  hematuria    New Prescriptions New Prescriptions   No medications on file   I personally performed the services described in this documentation, which was scribed in my presence. The recorded information has been reviewed and is accurate.     Deno Etienne, DO 09/11/16 [phone]

## 2016-09-10 NOTE — ED Notes (Signed)
Bed: WA09 Expected date:  Expected time:  Means of arrival:  Comments: 31f AMS

## 2016-09-11 ENCOUNTER — Encounter (HOSPITAL_COMMUNITY): Payer: Self-pay | Admitting: Family Medicine

## 2016-09-11 DIAGNOSIS — G9340 Encephalopathy, unspecified: Secondary | ICD-10-CM | POA: Diagnosis not present

## 2016-09-11 DIAGNOSIS — E1100 Type 2 diabetes mellitus with hyperosmolarity without nonketotic hyperglycemic-hyperosmolar coma (NKHHC): Secondary | ICD-10-CM | POA: Diagnosis not present

## 2016-09-11 DIAGNOSIS — K219 Gastro-esophageal reflux disease without esophagitis: Secondary | ICD-10-CM | POA: Diagnosis present

## 2016-09-11 DIAGNOSIS — Z794 Long term (current) use of insulin: Secondary | ICD-10-CM | POA: Diagnosis not present

## 2016-09-11 DIAGNOSIS — R339 Retention of urine, unspecified: Secondary | ICD-10-CM | POA: Diagnosis not present

## 2016-09-11 DIAGNOSIS — Z96651 Presence of right artificial knee joint: Secondary | ICD-10-CM | POA: Diagnosis present

## 2016-09-11 DIAGNOSIS — R6520 Severe sepsis without septic shock: Secondary | ICD-10-CM | POA: Diagnosis not present

## 2016-09-11 DIAGNOSIS — F0150 Vascular dementia without behavioral disturbance: Secondary | ICD-10-CM | POA: Diagnosis not present

## 2016-09-11 DIAGNOSIS — N179 Acute kidney failure, unspecified: Secondary | ICD-10-CM | POA: Diagnosis not present

## 2016-09-11 DIAGNOSIS — K047 Periapical abscess without sinus: Secondary | ICD-10-CM | POA: Diagnosis not present

## 2016-09-11 DIAGNOSIS — R4182 Altered mental status, unspecified: Secondary | ICD-10-CM | POA: Diagnosis not present

## 2016-09-11 DIAGNOSIS — I10 Essential (primary) hypertension: Secondary | ICD-10-CM

## 2016-09-11 DIAGNOSIS — F419 Anxiety disorder, unspecified: Secondary | ICD-10-CM | POA: Diagnosis present

## 2016-09-11 DIAGNOSIS — J302 Other seasonal allergic rhinitis: Secondary | ICD-10-CM | POA: Diagnosis not present

## 2016-09-11 DIAGNOSIS — Z885 Allergy status to narcotic agent status: Secondary | ICD-10-CM | POA: Diagnosis not present

## 2016-09-11 DIAGNOSIS — Z96622 Presence of left artificial elbow joint: Secondary | ICD-10-CM | POA: Diagnosis present

## 2016-09-11 DIAGNOSIS — R262 Difficulty in walking, not elsewhere classified: Secondary | ICD-10-CM | POA: Diagnosis not present

## 2016-09-11 DIAGNOSIS — Z79899 Other long term (current) drug therapy: Secondary | ICD-10-CM | POA: Diagnosis not present

## 2016-09-11 DIAGNOSIS — R278 Other lack of coordination: Secondary | ICD-10-CM | POA: Diagnosis not present

## 2016-09-11 DIAGNOSIS — F0390 Unspecified dementia without behavioral disturbance: Secondary | ICD-10-CM | POA: Diagnosis present

## 2016-09-11 DIAGNOSIS — E876 Hypokalemia: Secondary | ICD-10-CM | POA: Diagnosis not present

## 2016-09-11 DIAGNOSIS — A419 Sepsis, unspecified organism: Secondary | ICD-10-CM | POA: Diagnosis not present

## 2016-09-11 DIAGNOSIS — N3001 Acute cystitis with hematuria: Secondary | ICD-10-CM | POA: Diagnosis present

## 2016-09-11 DIAGNOSIS — Z741 Need for assistance with personal care: Secondary | ICD-10-CM | POA: Diagnosis not present

## 2016-09-11 DIAGNOSIS — Z7982 Long term (current) use of aspirin: Secondary | ICD-10-CM | POA: Diagnosis not present

## 2016-09-11 DIAGNOSIS — E119 Type 2 diabetes mellitus without complications: Secondary | ICD-10-CM | POA: Diagnosis present

## 2016-09-11 DIAGNOSIS — F418 Other specified anxiety disorders: Secondary | ICD-10-CM | POA: Diagnosis not present

## 2016-09-11 DIAGNOSIS — Z66 Do not resuscitate: Secondary | ICD-10-CM | POA: Diagnosis present

## 2016-09-11 DIAGNOSIS — M6281 Muscle weakness (generalized): Secondary | ICD-10-CM | POA: Diagnosis not present

## 2016-09-11 DIAGNOSIS — I160 Hypertensive urgency: Secondary | ICD-10-CM | POA: Diagnosis present

## 2016-09-11 DIAGNOSIS — E1165 Type 2 diabetes mellitus with hyperglycemia: Secondary | ICD-10-CM | POA: Diagnosis not present

## 2016-09-11 DIAGNOSIS — Z4789 Encounter for other orthopedic aftercare: Secondary | ICD-10-CM | POA: Diagnosis not present

## 2016-09-11 DIAGNOSIS — R41841 Cognitive communication deficit: Secondary | ICD-10-CM | POA: Diagnosis not present

## 2016-09-11 DIAGNOSIS — R1311 Dysphagia, oral phase: Secondary | ICD-10-CM | POA: Diagnosis not present

## 2016-09-11 DIAGNOSIS — Z882 Allergy status to sulfonamides status: Secondary | ICD-10-CM | POA: Diagnosis not present

## 2016-09-11 DIAGNOSIS — E559 Vitamin D deficiency, unspecified: Secondary | ICD-10-CM | POA: Diagnosis not present

## 2016-09-11 DIAGNOSIS — G2581 Restless legs syndrome: Secondary | ICD-10-CM | POA: Diagnosis not present

## 2016-09-11 DIAGNOSIS — K3184 Gastroparesis: Secondary | ICD-10-CM | POA: Diagnosis not present

## 2016-09-11 LAB — URINALYSIS, ROUTINE W REFLEX MICROSCOPIC
KETONES UR: 5 mg/dL — AB
LEUKOCYTES UA: NEGATIVE
NITRITE: NEGATIVE
Specific Gravity, Urine: 1.028 (ref 1.005–1.030)
pH: 5 (ref 5.0–8.0)

## 2016-09-11 LAB — BASIC METABOLIC PANEL
ANION GAP: 11 (ref 5–15)
BUN: 28 mg/dL — ABNORMAL HIGH (ref 6–20)
CO2: 25 mmol/L (ref 22–32)
Calcium: 9.4 mg/dL (ref 8.9–10.3)
Chloride: 107 mmol/L (ref 101–111)
Creatinine, Ser: 1.28 mg/dL — ABNORMAL HIGH (ref 0.44–1.00)
GFR, EST AFRICAN AMERICAN: 45 mL/min — AB (ref >60)
GFR, EST NON AFRICAN AMERICAN: 39 mL/min — AB (ref >60)
GLUCOSE: 182 mg/dL — AB (ref 65–99)
Potassium: 3.2 mmol/L — ABNORMAL LOW (ref 3.5–5.1)
Sodium: 143 mmol/L (ref 135–145)

## 2016-09-11 LAB — GLUCOSE, CAPILLARY
GLUCOSE-CAPILLARY: 110 mg/dL — AB (ref 65–99)
GLUCOSE-CAPILLARY: 207 mg/dL — AB (ref 65–99)
GLUCOSE-CAPILLARY: 221 mg/dL — AB (ref 65–99)
GLUCOSE-CAPILLARY: 267 mg/dL — AB (ref 65–99)
Glucose-Capillary: 231 mg/dL — ABNORMAL HIGH (ref 65–99)
Glucose-Capillary: 262 mg/dL — ABNORMAL HIGH (ref 65–99)
Glucose-Capillary: 196 mg/dL — ABNORMAL HIGH (ref 65–99)

## 2016-09-11 LAB — CBC WITH DIFFERENTIAL/PLATELET
BASOS ABS: 0 10*3/uL (ref 0.0–0.1)
Basophils Relative: 0 %
EOS PCT: 0 %
Eosinophils Absolute: 0 10*3/uL (ref 0.0–0.7)
HEMATOCRIT: 40.4 % (ref 36.0–46.0)
Hemoglobin: 13.4 g/dL (ref 12.0–15.0)
LYMPHS PCT: 13 %
Lymphs Abs: 2 10*3/uL (ref 0.7–4.0)
MCH: 27 pg (ref 26.0–34.0)
MCHC: 33.2 g/dL (ref 30.0–36.0)
MCV: 81.3 fL (ref 78.0–100.0)
Monocytes Absolute: 1.1 10*3/uL — ABNORMAL HIGH (ref 0.1–1.0)
Monocytes Relative: 7 %
NEUTROS ABS: 12.7 10*3/uL — AB (ref 1.7–7.7)
Neutrophils Relative %: 80 %
PLATELETS: 285 10*3/uL (ref 150–400)
RBC: 4.97 MIL/uL (ref 3.87–5.11)
RDW: 14.9 % (ref 11.5–15.5)
WBC: 15.9 10*3/uL — AB (ref 4.0–10.5)

## 2016-09-11 LAB — MRSA PCR SCREENING: MRSA BY PCR: NEGATIVE

## 2016-09-11 MED ORDER — DOCUSATE SODIUM 100 MG PO CAPS
100.0000 mg | ORAL_CAPSULE | Freq: Two times a day (BID) | ORAL | Status: DC
  Administered 2016-09-11 – 2016-09-12 (×3): 100 mg via ORAL
  Filled 2016-09-11 (×3): qty 1

## 2016-09-11 MED ORDER — ASPIRIN 81 MG PO CHEW
81.0000 mg | CHEWABLE_TABLET | Freq: Every day | ORAL | Status: DC
  Administered 2016-09-12: 81 mg via ORAL
  Filled 2016-09-11: qty 1

## 2016-09-11 MED ORDER — MAGNESIUM OXIDE 400 (241.3 MG) MG PO TABS
400.0000 mg | ORAL_TABLET | Freq: Every day | ORAL | Status: DC
  Administered 2016-09-11 – 2016-09-12 (×2): 400 mg via ORAL
  Filled 2016-09-11 (×2): qty 1

## 2016-09-11 MED ORDER — PANTOPRAZOLE SODIUM 40 MG PO TBEC
40.0000 mg | DELAYED_RELEASE_TABLET | Freq: Every day | ORAL | Status: DC
  Administered 2016-09-11 – 2016-09-12 (×2): 40 mg via ORAL
  Filled 2016-09-11 (×2): qty 1

## 2016-09-11 MED ORDER — POTASSIUM CHLORIDE IN NACL 20-0.9 MEQ/L-% IV SOLN
INTRAVENOUS | Status: DC
  Administered 2016-09-11: 03:00:00 via INTRAVENOUS
  Filled 2016-09-11: qty 1000

## 2016-09-11 MED ORDER — INSULIN ASPART 100 UNIT/ML ~~LOC~~ SOLN
0.0000 [IU] | SUBCUTANEOUS | Status: DC
  Administered 2016-09-11 (×2): 8 [IU] via SUBCUTANEOUS
  Administered 2016-09-11 (×2): 5 [IU] via SUBCUTANEOUS
  Administered 2016-09-11: 3 [IU] via SUBCUTANEOUS
  Administered 2016-09-12: 8 [IU] via SUBCUTANEOUS
  Administered 2016-09-12: 5 [IU] via SUBCUTANEOUS

## 2016-09-11 MED ORDER — ACETAMINOPHEN 650 MG RE SUPP: 650.0000 mg | Freq: Four times a day (QID) | RECTAL | Status: DC | PRN

## 2016-09-11 MED ORDER — SODIUM CHLORIDE 0.9 % IV SOLN
INTRAVENOUS | Status: DC
  Administered 2016-09-11 – 2016-09-12 (×2): via INTRAVENOUS

## 2016-09-11 MED ORDER — LORATADINE 10 MG PO TABS
10.0000 mg | ORAL_TABLET | Freq: Every day | ORAL | Status: DC
  Administered 2016-09-11 – 2016-09-12 (×2): 10 mg via ORAL
  Filled 2016-09-11 (×2): qty 1

## 2016-09-11 MED ORDER — ONDANSETRON HCL 4 MG/2ML IJ SOLN: 4.0000 mg | Freq: Four times a day (QID) | INTRAMUSCULAR | Status: DC | PRN

## 2016-09-11 MED ORDER — PRAMIPEXOLE DIHYDROCHLORIDE 1 MG PO TABS
1.5000 mg | ORAL_TABLET | Freq: Three times a day (TID) | ORAL | Status: DC
  Administered 2016-09-11 – 2016-09-12 (×4): 1.5 mg via ORAL
  Filled 2016-09-11 (×5): qty 2

## 2016-09-11 MED ORDER — POLYETHYLENE GLYCOL 3350 17 G PO PACK
17.0000 g | PACK | Freq: Every day | ORAL | Status: DC
  Administered 2016-09-11: 17 g via ORAL
  Filled 2016-09-11 (×2): qty 1

## 2016-09-11 MED ORDER — POTASSIUM CHLORIDE CRYS ER 20 MEQ PO TBCR
40.0000 meq | EXTENDED_RELEASE_TABLET | Freq: Once | ORAL | Status: AC
  Administered 2016-09-11: 40 meq via ORAL
  Filled 2016-09-11: qty 2

## 2016-09-11 MED ORDER — DEXTROSE 5 % IV SOLN
1.0000 g | Freq: Once | INTRAVENOUS | Status: AC
  Administered 2016-09-11: 1 g via INTRAVENOUS
  Filled 2016-09-11: qty 10

## 2016-09-11 MED ORDER — METOPROLOL TARTRATE 5 MG/5ML IV SOLN
5.0000 mg | Freq: Once | INTRAVENOUS | Status: AC
  Administered 2016-09-11: 5 mg via INTRAVENOUS
  Filled 2016-09-11: qty 5

## 2016-09-11 MED ORDER — METOPROLOL SUCCINATE ER 50 MG PO TB24
50.0000 mg | ORAL_TABLET | Freq: Every day | ORAL | Status: DC
  Administered 2016-09-11 – 2016-09-12 (×2): 50 mg via ORAL
  Filled 2016-09-11 (×2): qty 1

## 2016-09-11 MED ORDER — ENSURE ENLIVE PO LIQD
120.0000 mL | Freq: Two times a day (BID) | ORAL | Status: DC
  Administered 2016-09-11 (×2): 120 mL via ORAL

## 2016-09-11 MED ORDER — ONDANSETRON HCL 4 MG PO TABS: 4.0000 mg | ORAL_TABLET | Freq: Four times a day (QID) | ORAL | Status: DC | PRN

## 2016-09-11 MED ORDER — ACETAMINOPHEN 325 MG PO TABS
650.0000 mg | ORAL_TABLET | Freq: Four times a day (QID) | ORAL | Status: DC | PRN
  Administered 2016-09-12: 650 mg via ORAL
  Filled 2016-09-11: qty 2

## 2016-09-11 MED ORDER — BISACODYL 10 MG RE SUPP: 10.0000 mg | Freq: Every day | RECTAL | Status: DC | PRN

## 2016-09-11 MED ORDER — OXYCODONE HCL 5 MG PO TABS: 5.0000 mg | ORAL_TABLET | ORAL | Status: DC | PRN

## 2016-09-11 MED ORDER — ALPRAZOLAM 0.25 MG PO TABS: 0.2500 mg | ORAL_TABLET | Freq: Two times a day (BID) | ORAL | Status: DC | PRN

## 2016-09-11 MED ORDER — INSULIN DETEMIR 100 UNIT/ML ~~LOC~~ SOLN
12.0000 [IU] | Freq: Every day | SUBCUTANEOUS | Status: DC
  Administered 2016-09-11 (×2): 12 [IU] via SUBCUTANEOUS
  Filled 2016-09-11 (×3): qty 0.12

## 2016-09-11 MED ORDER — DULOXETINE HCL 30 MG PO CPEP
30.0000 mg | ORAL_CAPSULE | Freq: Two times a day (BID) | ORAL | Status: DC
  Administered 2016-09-11 – 2016-09-12 (×3): 30 mg via ORAL
  Filled 2016-09-11 (×3): qty 1

## 2016-09-11 MED ORDER — HEPARIN SODIUM (PORCINE) 5000 UNIT/ML IJ SOLN
5000.0000 [IU] | Freq: Three times a day (TID) | INTRAMUSCULAR | Status: DC
  Administered 2016-09-11 – 2016-09-12 (×4): 5000 [IU] via SUBCUTANEOUS
  Filled 2016-09-11 (×4): qty 1

## 2016-09-11 MED ORDER — METHOCARBAMOL 500 MG PO TABS: 500.0000 mg | ORAL_TABLET | Freq: Four times a day (QID) | ORAL | Status: DC | PRN

## 2016-09-11 MED ORDER — AMLODIPINE BESYLATE 5 MG PO TABS
5.0000 mg | ORAL_TABLET | Freq: Every day | ORAL | Status: DC
  Administered 2016-09-11 – 2016-09-12 (×2): 5 mg via ORAL
  Filled 2016-09-11 (×2): qty 1

## 2016-09-11 MED ORDER — HYDRALAZINE HCL 20 MG/ML IJ SOLN
10.0000 mg | INTRAMUSCULAR | Status: DC | PRN
  Filled 2016-09-11: qty 0.5

## 2016-09-11 MED ORDER — INSULIN ASPART 100 UNIT/ML ~~LOC~~ SOLN
10.0000 [IU] | Freq: Once | SUBCUTANEOUS | Status: AC
  Administered 2016-09-11: 10 [IU] via INTRAVENOUS
  Filled 2016-09-11: qty 1

## 2016-09-11 MED ORDER — SODIUM CHLORIDE 0.9 % IV SOLN
3.0000 g | Freq: Four times a day (QID) | INTRAVENOUS | Status: DC
  Administered 2016-09-11 – 2016-09-12 (×5): 3 g via INTRAVENOUS
  Filled 2016-09-11 (×8): qty 3

## 2016-09-11 MED ORDER — TRAMADOL HCL 50 MG PO TABS: 50.0000 mg | ORAL_TABLET | Freq: Four times a day (QID) | ORAL | Status: DC | PRN

## 2016-09-11 NOTE — H&P (Signed)
History and Physical    [name] YQM:[id number] DOB: Dec 19, 1940 DOA: 09/10/2016  PCP: [name], MD   Patient coming from: Nursing home  Chief Complaint: Increased confusion, restlessness   HPI: [name] is a 76 y.o. female with medical history significant for dementia, depression with anxiety, hypertension, insulin-dependent diabetes mellitus, and obesity, presenting from her nursing facility for evaluation of increased confusion and restlessness. Patient is accompanied by her daughter who assists with the history. Patient's daughter noticed some increased confusion and apparent discomfort beginning approximately 3 days ago. Patient does not voice any specific complaints. Yesterday, the confusion and restlessness seemed to worsen significantly, raising concern from the patient's daughter that there was a recurrent UTI. Tonight, with the patient continued to worsen, daughter requested EMS transport to the hospital.  ED Course: Upon arrival to the ED, patient is found to be febrile to 38.1 C, saturating adequately on room air, tachycardic in the low 100s, and hypertensive. Chest x-rays negative for acute cardiopulmonary disease, urinalysis is notable for bacteriuria and glucosuria, but is not particularly suggestive of infection. Chemistry panel was notable for potassium 3.4, BUN of 27, serum glucose of 408, and creatinine of 1.38, up from 0.78 last month. CBC features a leukocytosis to 15,200 and lactic acid was reassuring at 1.88. Patient was noted to have some left-sided facial edema and exam revealed a dental abscess which was successfully drained by the ED physician. Patient was given a liter of normal saline, 10 units of IV NovoLog, and a dose of empiric Rocephin. She remained mildly tachycardic with EKG pending, but otherwise stable, and will be observed on the telemetry unit for ongoing evaluation and management of acute encephalopathy suspected secondary to infection  with dental abscess which has now been drained.  Review of Systems:  Unable to obtain ROS secondary to the patient's clinical condition with advanced dementia.  Past Medical History:  Diagnosis Date  . Anxiety   . Depression   . Diabetes mellitus   . Dyspnea   . GERD (gastroesophageal reflux disease)   . Headache(784.0)   . HTN (hypertension)    dr Johnsie Cancel  . Hypercholesteremia    DENIES  . Osteoarthritis   . PONV (postoperative nausea and vomiting)   . RLS (restless legs syndrome)   . Stress     Past Surgical History:  Procedure Laterality Date  . ABDOMINAL HYSTERECTOMY    . COLONOSCOPY    . JOINT REPLACEMENT    . left forearm fracture with ORIF Left   . LUMBAR LAMINECTOMY/DECOMPRESSION MICRODISCECTOMY N/A 04/18/2012   Procedure: LUMBAR LAMINECTOMY/DECOMPRESSION MICRODISCECTOMY 3 LEVELS;  Surgeon: Winfield Cunas, MD;  Location: [facility];  Service: Neurosurgery;  Laterality: N/A;  Lumbar three-four, lumbar four-five, lumbar five-sacral one decompression. Synovial cyst resection lumbar four-five  . ORIF PERIPROSTHETIC FRACTURE Right 07/06/2016   Procedure: OPEN REDUCTION INTERNAL FIXATION (ORIF) PERIPROSTHETIC FRACTURE;  Surgeon: Gaynelle Arabian, MD;  Location: [facility];  Service: Orthopedics;  Laterality: Right;  . REPLACEMENT TOTAL KNEE Right   . WISDOM TOOTH EXTRACTION       reports that she has never smoked. She has never used smokeless tobacco. She reports that she does not drink alcohol or use drugs.  Allergies  Allergen Reactions  . Morphine And Related Other (See Comments)    Reaction:  Hallucinations   . Oxycodone Other (See Comments)    Reaction:  Hallucinations  . Sulfonamide Derivatives Swelling and Other (See Comments)    Reaction:  Unspecified swelling reaction  Family History  Problem Relation Age of Onset  . Diabetes Father   . High blood pressure Father   . High blood pressure Mother   . Colon cancer Neg Hx   . Esophageal cancer Neg Hx   .  Liver disease Neg Hx   . Stomach cancer Neg Hx   . Pancreatic cancer Neg Hx      Prior to Admission medications   Medication Sig Start Date End Date Taking? Authorizing Provider  acetaminophen (TYLENOL) 325 MG tablet Take 650 mg by mouth 2 (two) times daily.    [provider]  acetaminophen (TYLENOL) 500 MG tablet Take 500 mg by mouth every 8 (eight) hours as needed for mild pain.    [provider]  ALPRAZolam Duanne Moron) 0.25 MG tablet Take 1 tablet (0.25 mg total) by mouth 2 (two) times daily. 08/13/16   Thurnell Lose, MD  amLODipine (NORVASC) 5 MG tablet Take 1 tablet (5 mg total) by mouth daily. 07/14/14   Ghimire, Henreitta Leber, MD  aspirin 81 MG chewable tablet Chew 81 mg by mouth daily.    [provider]  bisacodyl (DULCOLAX) 10 MG suppository Place 1 suppository (10 mg total) rectally daily as needed for moderate constipation. 07/08/16   Perkins, Alexzandrew L, PA-C  cefpodoxime (VANTIN) 200 MG tablet Take 1 tablet (200 mg total) by mouth 2 (two) times daily. 2 more days 08/13/16   Thurnell Lose, MD  docusate sodium (COLACE) 100 MG capsule Take 100 mg by mouth 2 (two) times daily.    [provider]  DULoxetine (CYMBALTA) 30 MG capsule Take 30 mg by mouth 2 (two) times daily.    [provider]  insulin aspart (NOVOLOG) 100 UNIT/ML injection Inject 2-11 Units into the skin 3 (three) times daily before meals. Pt uses per sliding scale:    101-150:  2 units 151-200:  3 units  201-250:  5 units  251-300:  7 units  301-350:  9 units  Greater than 350:  11 units Greater than 400 or less than 60:  Call MD    [provider]  Insulin Detemir (LEVEMIR FLEXTOUCH) 100 UNIT/ML Pen Inject 8-12 Units into the skin 2 (two) times daily. Pt uses 12 units in the morning and 8 units at bedtime.    [provider]  loratadine (CLARITIN) 10 MG tablet Take 10 mg by mouth daily.     [provider]  losartan (COZAAR) 100 MG tablet  Take 100 mg by mouth every evening.     [provider]  Magnesium Oxide 500 MG TABS Take 500 mg by mouth daily.     [provider]  methocarbamol (ROBAXIN) 500 MG tablet Take 1 tablet (500 mg total) by mouth every 6 (six) hours as needed for muscle spasms. 07/08/16   Perkins, Alexzandrew L, PA-C  metoprolol succinate (TOPROL-XL) 50 MG 24 hr tablet Take 1 tablet (50 mg total) by mouth daily. 07/04/14   Robbie Lis, MD  Nutritional Supplements (NUTRITIONAL SUPPLEMENT PLUS) LIQD Take 120 mLs by mouth 2 (two) times daily. [facility] [employer]    [provider]  omeprazole (PRILOSEC) 40 MG capsule Take 40 mg by mouth daily.    [provider]  polyethylene glycol (MIRALAX / GLYCOLAX) packet Take 17 g by mouth daily. 07/26/13   [name], MD  potassium bicarbonate (K-LYTE) 25 MEQ disintegrating tablet Take 25 mEq by mouth daily.    [provider]  pramipexole (MIRAPEX) 0.25 MG  tablet Take 1.5 mg by mouth 3 (three) times daily.     [provider]  traMADol (ULTRAM) 50 MG tablet Take 1 tablet (50 mg total) by mouth every 6 (six) hours as needed for moderate pain. 08/13/16   Thurnell Lose, MD    Physical Exam: Vitals:   09/10/16 2239 09/10/16 2245  BP:  (!) 155/106  Resp:  18  Temp:  (!) 100.6 F (38.1 C)  TempSrc:  (S) Rectal  SpO2:  99%  Weight: 95.3 kg (210 lb)   Height: 5\' 7"  (1.702 m)       Constitutional: No acute distress. Appears uncomfortable Eyes: PERTLA, lids and conjunctivae normal ENMT: Mucous membranes are moist. Mild edema to lower left face.   Neck: normal, supple, no masses, no thyromegaly Respiratory: clear to auscultation bilaterally, no wheezing, no crackles. Normal respiratory effort.    Cardiovascular: Rate ~110 and irregular. Soft systolic murmur at USB. No significant JVD. Abdomen: No distension, no tenderness, no masses palpated. Bowel sounds normal.  Musculoskeletal: no clubbing / cyanosis. No joint deformity  upper and lower extremities.   Skin: no significant rashes, lesions, ulcers. Warm, dry, well-perfused. Neurologic: No gross facial weakness, PERRL, patellar DTR's wnl, moving all extremities spontaneously.  Psychiatric: Difficult to assess given the clinical condition with acute encephalopathy superimposed on dementia.     Labs on Admission: I have personally reviewed following labs and imaging studies  CBC:  Recent Labs Lab 09/10/16 2318  WBC 15.2*  HGB 14.4  HCT 41.9  MCV 80.4  PLT 161   Basic Metabolic Panel:  Recent Labs Lab 09/10/16 2318  NA 141  K 3.4*  CL 103  CO2 25  GLUCOSE 408*  BUN 27*  CREATININE 1.38*  CALCIUM 9.6   GFR: Estimated Creatinine Clearance: 39.8 mL/min (A) (by C-G formula based on SCr of 1.38 mg/dL (H)). Liver Function Tests:  Recent Labs Lab 09/10/16 2318  AST 14*  ALT 11*  ALKPHOS 85  BILITOT 0.9  PROT 7.7  ALBUMIN 3.8   No results for input(s): LIPASE, AMYLASE in the last 168 hours. No results for input(s): AMMONIA in the last 168 hours. Coagulation Profile: No results for input(s): INR, PROTIME in the last 168 hours. Cardiac Enzymes: No results for input(s): CKTOTAL, CKMB, CKMBINDEX, TROPONINI in the last 168 hours. BNP (last 3 results) No results for input(s): PROBNP in the last 8760 hours. HbA1C: No results for input(s): HGBA1C in the last 72 hours. CBG:  Recent Labs Lab 09/10/16 2334  GLUCAP 386*   Lipid Profile: No results for input(s): CHOL, HDL, LDLCALC, TRIG, CHOLHDL, LDLDIRECT in the last 72 hours. Thyroid Function Tests: No results for input(s): TSH, T4TOTAL, FREET4, T3FREE, THYROIDAB in the last 72 hours. Anemia Panel: No results for input(s): VITAMINB12, FOLATE, FERRITIN, TIBC, IRON, RETICCTPCT in the last 72 hours. Urine analysis:    Component Value Date/Time   COLORURINE AMBER (A) 09/11/2016 0003   APPEARANCEUR HAZY (A) 09/11/2016 0003   LABSPEC 1.028 09/11/2016 0003   PHURINE 5.0 09/11/2016 0003    GLUCOSEU >=500 (A) 09/11/2016 0003   HGBUR SMALL (A) 09/11/2016 0003   BILIRUBINUR SMALL (A) 09/11/2016 0003   KETONESUR 5 (A) 09/11/2016 0003   PROTEINUR >=300 (A) 09/11/2016 0003   UROBILINOGEN 0.2 07/20/2014 2228   NITRITE NEGATIVE 09/11/2016 0003   LEUKOCYTESUR NEGATIVE 09/11/2016 0003   Sepsis Labs: @LABRCNTIP (procalcitonin:4,lacticidven:4) )No results found for this or any previous visit (from the past 240 hour(s)).   Radiological Exams on Admission:  Dg Chest 2 View  Result Date: 09/10/2016 CLINICAL DATA:  Altered mental status, fever EXAM: CHEST  2 VIEW COMPARISON:  08/10/2016 FINDINGS: No focal pulmonary infiltrate, consolidation, or pleural effusion. Stable cardiomediastinal silhouette. No pneumothorax. IMPRESSION: No acute infiltrate or edema. Electronically Signed   By: Donavan Foil M.D.   On: 09/10/2016 23:13    EKG: Has been ordered, not yet performed.   Assessment/Plan  1. Acute encephalopathy   - Pt presents from nursing facility with increased confusion beyond her baseline dementia, as well as restlessness  - She is found to be febrile with leukocytosis and this is suspected secondary to the acute infection - No recent fall or trauma, no focal neurologic findings on exam  - Anticipate resolution with treatment of #2 as discussed below   2. Dental abscess  - Pt presents with confusion, fever, leukocytosis; lactic acid is normal and BP has remained stable  - Abscess was drained by ED physician and she was given a dose of Rocephin in ED  - Plan to continue empiric abx with Unasyn; if she fails to improve as expected with drainage and abx, may need to obtain maxillofacial CT   3. Acute kidney injury  - SCr is 1.38 on admission, up from 0.78 last month  - Likely prerenal in setting of acute infection with fever and 3 days of poor appetite  - She was given a liter of NS in ED and will be continued on NS infusion  - Hold losartan, repeat chem panel in am    4.  Insulin-dependent DM  - A1c was 9.1% in 2016 - Managed at the nursing home with Levemir 12 units qAM, 8 units qPM, and Novolog 2-11 units TID per sliding-scale  - Serum glucose in 408 on admission and she was treated in ED with 10 units IV Novolog  - Check CBG's q4h until appropriate for diet - Continue Levemir with 12 units BID, and Novolog per a moderate-intensity sliding-scale    5. Hypertension with hypertensive urgency  - BP elevated in ED with DBP in low 100's  - Pain may be contributing, will treat prn  - Continue Norvasc and Toprol, hold losartan until renal function stabilizes  - Use hydralazine IVP's prn   6. Depression, anxiety  - Continue Cymbalta and Xanax   7. Hypokalemia  - Potassium 3.4  - KCl added to IVF - Chem panel will be repeated in am     DVT prophylaxis: sq heparin Code Status: DNR Family Communication: Daughter updated at bedside Disposition Plan: Observe on telemetry Consults called: None Admission status: Observation   Vianne Bulls, MD [facility] Pager [phone]  If 7PM-7AM, please contact night-coverage [email or web address] Password [facility]  09/11/2016, 12:42 AM

## 2016-09-11 NOTE — Progress Notes (Signed)
  PROGRESS NOTE  Patient admitted earlier this morning. See H&P. Patient admitted due to confusion and restlessness. She does have dementia at baseline and resides at [facility] with daughter this morning who states that patient at baseline is conversational, sometimes gets confused regarding the day of the week or will forget what she had for her meals, but participates with physical therapy and bingo at her residence. This morning, patient was awake, alert, not oriented to place or time but was able to tell me her daughter's name. Her main complaint was hunger and denied any other pain or other complaints. Daughter sees this as a big improvement as patient has had poor oral intake the past few days.   Continue unasyn  Replace K  Continue IVF  Trend labs  Blood sugars are better today   Dessa Phi, DO [facility] [email or web address] Password [facility] 09/11/2016, 10:33 AM

## 2016-09-12 DIAGNOSIS — J302 Other seasonal allergic rhinitis: Secondary | ICD-10-CM | POA: Diagnosis not present

## 2016-09-12 DIAGNOSIS — F0390 Unspecified dementia without behavioral disturbance: Secondary | ICD-10-CM | POA: Diagnosis not present

## 2016-09-12 DIAGNOSIS — E1165 Type 2 diabetes mellitus with hyperglycemia: Secondary | ICD-10-CM | POA: Diagnosis not present

## 2016-09-12 DIAGNOSIS — Z4789 Encounter for other orthopedic aftercare: Secondary | ICD-10-CM | POA: Diagnosis not present

## 2016-09-12 DIAGNOSIS — E119 Type 2 diabetes mellitus without complications: Secondary | ICD-10-CM | POA: Diagnosis not present

## 2016-09-12 DIAGNOSIS — G9340 Encephalopathy, unspecified: Secondary | ICD-10-CM | POA: Diagnosis not present

## 2016-09-12 DIAGNOSIS — F4323 Adjustment disorder with mixed anxiety and depressed mood: Secondary | ICD-10-CM | POA: Diagnosis not present

## 2016-09-12 DIAGNOSIS — I10 Essential (primary) hypertension: Secondary | ICD-10-CM | POA: Diagnosis not present

## 2016-09-12 DIAGNOSIS — K047 Periapical abscess without sinus: Secondary | ICD-10-CM | POA: Diagnosis not present

## 2016-09-12 DIAGNOSIS — R262 Difficulty in walking, not elsewhere classified: Secondary | ICD-10-CM | POA: Diagnosis not present

## 2016-09-12 DIAGNOSIS — F329 Major depressive disorder, single episode, unspecified: Secondary | ICD-10-CM | POA: Diagnosis not present

## 2016-09-12 DIAGNOSIS — G2581 Restless legs syndrome: Secondary | ICD-10-CM | POA: Diagnosis not present

## 2016-09-12 DIAGNOSIS — Z741 Need for assistance with personal care: Secondary | ICD-10-CM | POA: Diagnosis not present

## 2016-09-12 DIAGNOSIS — R6520 Severe sepsis without septic shock: Secondary | ICD-10-CM | POA: Diagnosis not present

## 2016-09-12 DIAGNOSIS — R41841 Cognitive communication deficit: Secondary | ICD-10-CM | POA: Diagnosis not present

## 2016-09-12 DIAGNOSIS — F418 Other specified anxiety disorders: Secondary | ICD-10-CM | POA: Diagnosis not present

## 2016-09-12 DIAGNOSIS — R339 Retention of urine, unspecified: Secondary | ICD-10-CM | POA: Diagnosis not present

## 2016-09-12 DIAGNOSIS — R1311 Dysphagia, oral phase: Secondary | ICD-10-CM | POA: Diagnosis not present

## 2016-09-12 DIAGNOSIS — K3184 Gastroparesis: Secondary | ICD-10-CM | POA: Diagnosis not present

## 2016-09-12 DIAGNOSIS — E559 Vitamin D deficiency, unspecified: Secondary | ICD-10-CM | POA: Diagnosis not present

## 2016-09-12 DIAGNOSIS — R4182 Altered mental status, unspecified: Secondary | ICD-10-CM | POA: Diagnosis not present

## 2016-09-12 DIAGNOSIS — F419 Anxiety disorder, unspecified: Secondary | ICD-10-CM | POA: Diagnosis not present

## 2016-09-12 DIAGNOSIS — R278 Other lack of coordination: Secondary | ICD-10-CM | POA: Diagnosis not present

## 2016-09-12 DIAGNOSIS — M6281 Muscle weakness (generalized): Secondary | ICD-10-CM | POA: Diagnosis not present

## 2016-09-12 DIAGNOSIS — A419 Sepsis, unspecified organism: Secondary | ICD-10-CM | POA: Diagnosis not present

## 2016-09-12 LAB — CBC WITH DIFFERENTIAL/PLATELET
BASOS ABS: 0 10*3/uL (ref 0.0–0.1)
BASOS PCT: 0 %
Eosinophils Absolute: 0 10*3/uL (ref 0.0–0.7)
Eosinophils Relative: 0 %
HCT: 37.3 % (ref 36.0–46.0)
HEMOGLOBIN: 11.9 g/dL — AB (ref 12.0–15.0)
Lymphocytes Relative: 23 %
Lymphs Abs: 2 10*3/uL (ref 0.7–4.0)
MCH: 26.4 pg (ref 26.0–34.0)
MCHC: 31.9 g/dL (ref 30.0–36.0)
MCV: 82.7 fL (ref 78.0–100.0)
Monocytes Absolute: 0.4 10*3/uL (ref 0.1–1.0)
Monocytes Relative: 4 %
NEUTROS PCT: 73 %
Neutro Abs: 6.1 10*3/uL (ref 1.7–7.7)
Platelets: 245 10*3/uL (ref 150–400)
RBC: 4.51 MIL/uL (ref 3.87–5.11)
RDW: 14.9 % (ref 11.5–15.5)
WBC: 8.5 10*3/uL (ref 4.0–10.5)

## 2016-09-12 LAB — BASIC METABOLIC PANEL
ANION GAP: 7 (ref 5–15)
BUN: 25 mg/dL — ABNORMAL HIGH (ref 6–20)
CHLORIDE: 112 mmol/L — AB (ref 101–111)
CO2: 25 mmol/L (ref 22–32)
CREATININE: 0.89 mg/dL (ref 0.44–1.00)
Calcium: 8.9 mg/dL (ref 8.9–10.3)
GFR calc non Af Amer: >60 mL/min (ref >60)
Glucose, Bld: 115 mg/dL — ABNORMAL HIGH (ref 65–99)
POTASSIUM: 3.1 mmol/L — AB (ref 3.5–5.1)
SODIUM: 144 mmol/L (ref 135–145)

## 2016-09-12 LAB — MAGNESIUM: MAGNESIUM: 1.7 mg/dL (ref 1.7–2.4)

## 2016-09-12 LAB — GLUCOSE, CAPILLARY
GLUCOSE-CAPILLARY: 106 mg/dL — AB (ref 65–99)
GLUCOSE-CAPILLARY: 262 mg/dL — AB (ref 65–99)
Glucose-Capillary: 112 mg/dL — ABNORMAL HIGH (ref 65–99)

## 2016-09-12 MED ORDER — AMLODIPINE BESYLATE 5 MG PO TABS: 5.0000 mg | ORAL_TABLET | Freq: Every day | ORAL | 0 refills | Status: DC

## 2016-09-12 MED ORDER — ALPRAZOLAM 0.25 MG PO TABS: 0.2500 mg | ORAL_TABLET | Freq: Every evening | ORAL | 0 refills | Status: DC | PRN

## 2016-09-12 MED ORDER — POTASSIUM CHLORIDE CRYS ER 20 MEQ PO TBCR
40.0000 meq | EXTENDED_RELEASE_TABLET | ORAL | Status: DC
  Administered 2016-09-12: 40 meq via ORAL
  Filled 2016-09-12: qty 2

## 2016-09-12 MED ORDER — ASPIRIN 81 MG PO CHEW: 81.0000 mg | CHEWABLE_TABLET | Freq: Every day | ORAL | 0 refills | Status: DC

## 2016-09-12 MED ORDER — AMOXICILLIN-POT CLAVULANATE 875-125 MG PO TABS: 1.0000 | ORAL_TABLET | Freq: Two times a day (BID) | ORAL | 0 refills | Status: AC

## 2016-09-12 NOTE — Discharge Summary (Signed)
Physician Discharge Summary  [name] QIH:[id number] DOB: 01-27-1941 DOA: 09/10/2016  PCP: [name], MD  Admit date: 09/10/2016 Discharge date: 09/12/2016  Admitted From: SNF Disposition:  SNF  Recommendations for Outpatient Follow-up:  1. Follow up with PCP in 1 week 2. Make appointment with dentist  3. Please obtain BMP in 1 week   Discharge Condition: Stable CODE STATUS: DNR  Diet recommendation: Carb modified   Brief/Interim Summary: HPI by Dr. Myna Hidalgo: [name] is a 76 y.o. female with medical history significant for dementia, depression with anxiety, hypertension, insulin-dependent diabetes mellitus, and obesity, presenting from her nursing facility for evaluation of increased confusion and restlessness. Patient is accompanied by her daughter who assists with the history. Patient's daughter noticed some increased confusion and apparent discomfort beginning approximately 3 days ago. Patient does not voice any specific complaints. Yesterday, the confusion and restlessness seemed to worsen significantly, raising concern from the patient's daughter that there was a recurrent UTI. Tonight, with the patient continued to worsen, daughter requested EMS transport to the hospital.  ED Course: Upon arrival to the ED, patient is found to be febrile to 38.1 C, saturating adequately on room air, tachycardic in the low 100s, and hypertensive. Chest x-rays negative for acute cardiopulmonary disease, urinalysis is notable for bacteriuria and glucosuria, but is not particularly suggestive of infection. Chemistry panel was notable for potassium 3.4, BUN of 27, serum glucose of 408, and creatinine of 1.38, up from 0.78 last month. CBC features a leukocytosis to 15,200 and lactic acid was reassuring at 1.88. Patient was noted to have some left-sided facial edema and exam revealed a dental abscess which was successfully drained by the ED physician. Patient was given a liter of normal  saline, 10 units of IV NovoLog, and a dose of empiric Rocephin. She remained mildly tachycardic with EKG pending, but otherwise stable, and will be observed on the telemetry unit for ongoing evaluation and management of acute encephalopathy suspected secondary to infection with dental abscess which has now been drained.  1. Acute encephalopathy   - Pt presents from nursing facility with increased confusion beyond her baseline dementia, as well as restlessness  - She is found to be febrile with leukocytosis and this is suspected secondary to the acute infection - Resolved and back to baseline   2. Dental abscess  - Pt presents with confusion, fever, leukocytosis; lactic acid is normal and BP has remained stable  - Abscess was drained by ED physician and she was given a dose of Rocephin in ED  - Transition unasyn to Augmentin on discharge - Recommend that she follow up with outpatient dentist   3. Acute kidney injury  - SCr is 1.38 on admission, up from 0.78 last month  - Likely prerenal in setting of acute infection with fever and 3 days of poor appetite  - Resolved with IVF   4. Insulin-dependent DM  - Blood sugar much better now, continue insulin at SNF   5. Hypertension with hypertensive urgency  - Continue Norvasc and Toprol, resume ARB  - BP improved   6. Depression, anxiety  - Continue Cymbalta and Xanax   7. Hypokalemia  - Replaced, trend BMP as outpatient    Discharge Instructions  Discharge Instructions    Call MD for:  difficulty breathing, headache or visual disturbances    Complete by:  As directed    Call MD for:  extreme fatigue    Complete by:  As directed    Call  MD for:  hives    Complete by:  As directed    Call MD for:  persistant dizziness or light-headedness    Complete by:  As directed    Call MD for:  persistant nausea and vomiting    Complete by:  As directed    Call MD for:  severe uncontrolled pain    Complete by:  As directed    Call MD  for:  temperature >100.4    Complete by:  As directed    Diet Carb Modified    Complete by:  As directed    Discharge instructions    Complete by:  As directed    You were cared for by a hospitalist during your hospital stay. If you have any questions about your discharge medications or the care you received while you were in the hospital after you are discharged, you can call the unit and asked to speak with the hospitalist on call if the hospitalist that took care of you is not available. Once you are discharged, your primary care physician will handle any further medical issues. Please note that NO REFILLS for any discharge medications will be authorized once you are discharged, as it is imperative that you return to your primary care physician (or establish a relationship with a primary care physician if you do not have one) for your aftercare needs so that they can reassess your need for medications and monitor your lab values.   Increase activity slowly    Complete by:  As directed      Allergies as of 09/12/2016      Reactions   Morphine And Related Other (See Comments)   Reaction:  Hallucinations    Oxycodone Other (See Comments)   Reaction:  Hallucinations   Sulfonamide Derivatives Swelling, Other (See Comments)   Reaction:  Unspecified swelling reaction       Medication List    STOP taking these medications   cefpodoxime 200 MG tablet Commonly known as:  VANTIN   potassium bicarbonate 25 MEQ disintegrating tablet Commonly known as:  K-LYTE   traMADol 50 MG tablet Commonly known as:  ULTRAM     TAKE these medications   acetaminophen 500 MG tablet Commonly known as:  TYLENOL Take 500 mg by mouth every 8 (eight) hours as needed for mild pain.   ALPRAZolam 0.25 MG tablet Commonly known as:  XANAX Take 1 tablet (0.25 mg total) by mouth at bedtime as needed for anxiety.   amLODipine 5 MG tablet Commonly known as:  NORVASC Take 1 tablet (5 mg total) by mouth daily.    amoxicillin-clavulanate 875-125 MG tablet Commonly known as:  AUGMENTIN Take 1 tablet by mouth 2 (two) times daily.   aspirin 81 MG chewable tablet Chew 1 tablet (81 mg total) by mouth daily.   bisacodyl 10 MG suppository Commonly known as:  DULCOLAX Place 1 suppository (10 mg total) rectally daily as needed for moderate constipation.   docusate sodium 100 MG capsule Commonly known as:  COLACE Take 100 mg by mouth 2 (two) times daily.   DULoxetine 30 MG capsule Commonly known as:  CYMBALTA Take 30 mg by mouth 2 (two) times daily.   insulin aspart 100 UNIT/ML injection Commonly known as:  novoLOG Inject 2-11 Units into the skin 3 (three) times daily before meals. Pt uses per sliding scale:    101-150:  2 units 151-200:  3 units  201-250:  5 units  251-300:  7 units  301-350:  9 units  Greater than 350:  11 units Greater than 400 or less than 60:  Call MD   LEVEMIR FLEXTOUCH 100 UNIT/ML Pen Generic drug:  Insulin Detemir Inject 8-12 Units into the skin 2 (two) times daily. Pt uses 12 units in the morning and 8 units at bedtime.   loratadine 10 MG tablet Commonly known as:  CLARITIN Take 10 mg by mouth daily.   losartan 100 MG tablet Commonly known as:  COZAAR Take 100 mg by mouth every evening.   Magnesium Oxide 500 MG Tabs Take 500 mg by mouth daily.   methocarbamol 500 MG tablet Commonly known as:  ROBAXIN Take 1 tablet (500 mg total) by mouth every 6 (six) hours as needed for muscle spasms.   metoprolol succinate 50 MG 24 hr tablet Commonly known as:  TOPROL-XL Take 1 tablet (50 mg total) by mouth daily.   NUTRITIONAL SUPPLEMENT PLUS Liqd Take 120 mLs by mouth 2 (two) times daily. Med Pass   omeprazole 40 MG capsule Commonly known as:  PRILOSEC Take 40 mg by mouth daily.   pramipexole 0.25 MG tablet Commonly known as:  MIRAPEX Take 1.5 mg by mouth 3 (three) times daily.      Follow-up Information    [name], MD. Schedule an appointment as soon  as possible for a visit in 1 week(s).   Specialty:  Internal Medicine Contact information: [address] [place] [address] [phone]        Make appointment to see a dentist. Schedule an appointment as soon as possible for a visit in 1 week(s).          Allergies  Allergen Reactions  . Morphine And Related Other (See Comments)    Reaction:  Hallucinations   . Oxycodone Other (See Comments)    Reaction:  Hallucinations  . Sulfonamide Derivatives Swelling and Other (See Comments)    Reaction:  Unspecified swelling reaction     Consultations:  None   Procedures/Studies: Dg Chest 2 View  Result Date: 09/10/2016 CLINICAL DATA:  Altered mental status, fever EXAM: CHEST  2 VIEW COMPARISON:  08/10/2016 FINDINGS: No focal pulmonary infiltrate, consolidation, or pleural effusion. Stable cardiomediastinal silhouette. No pneumothorax. IMPRESSION: No acute infiltrate or edema. Electronically Signed   By: Donavan Foil M.D.   On: 09/10/2016 23:13       Discharge Exam: Vitals:   09/11/16 2036 09/12/16 0607  BP: (!) 144/95 (!) 160/85  Pulse: 90 68  Resp: 19 20  Temp: 98.8 F (37.1 C) 98.6 F (37 C)   Vitals:   09/11/16 0522 09/11/16 1110 09/11/16 2036 09/12/16 0607  BP: (!) 143/92 133/80 (!) 144/95 (!) 160/85  Pulse: 92 88 90 68  Resp: 20 20 19 20   Temp: 98.6 F (37 C) 98.6 F (37 C) 98.8 F (37.1 C) 98.6 F (37 C)  TempSrc: Axillary Oral Oral Oral  SpO2: 99% 98% 93% 98%  Weight:      Height:        General: Pt is alert, awake, not in acute distress Cardiovascular: RRR, S1/S2 +, no rubs, no gallops Respiratory: CTA bilaterally, no wheezing, no rhonchi Abdominal: Soft, NT, ND, bowel sounds + Extremities: no edema, no cyanosis    The results of significant diagnostics from this hospitalization (including imaging, microbiology, ancillary and laboratory) are listed below for reference.     Microbiology: Recent Results (from the past 240 hour(s))  MRSA  PCR Screening     Status: None   Collection  Time: 09/11/16  4:15 AM  Result Value Ref Range Status   MRSA by PCR NEGATIVE NEGATIVE Final    Comment:        The GeneXpert MRSA Assay (FDA approved for NASAL specimens only), is one component of a comprehensive MRSA colonization surveillance program. It is not intended to diagnose MRSA infection nor to guide or monitor treatment for MRSA infections.      Labs: BNP (last 3 results) No results for input(s): BNP in the last 8760 hours. Basic Metabolic Panel:  Recent Labs Lab 09/10/16 2318 09/11/16 0553 09/12/16 0534  NA 141 143 144  K 3.4* 3.2* 3.1*  CL 103 107 112*  CO2 25 25 25   GLUCOSE 408* 182* 115*  BUN 27* 28* 25*  CREATININE 1.38* 1.28* 0.89  CALCIUM 9.6 9.4 8.9  MG  --   --  1.7   Liver Function Tests:  Recent Labs Lab 09/10/16 2318  AST 14*  ALT 11*  ALKPHOS 85  BILITOT 0.9  PROT 7.7  ALBUMIN 3.8   No results for input(s): LIPASE, AMYLASE in the last 168 hours. No results for input(s): AMMONIA in the last 168 hours. CBC:  Recent Labs Lab 09/10/16 2318 09/11/16 0553 09/12/16 0534  WBC 15.2* 15.9* 8.5  NEUTROABS  --  12.7* 6.1  HGB 14.4 13.4 11.9*  HCT 41.9 40.4 37.3  MCV 80.4 81.3 82.7  PLT 314 285 245   Cardiac Enzymes: No results for input(s): CKTOTAL, CKMB, CKMBINDEX, TROPONINI in the last 168 hours. BNP: Invalid input(s): POCBNP CBG:  Recent Labs Lab 09/11/16 1628 09/11/16 2026 09/12/16 0004 09/12/16 0433 09/12/16 0746  GLUCAP 267* 196* 207* 112* 106*   D-Dimer No results for input(s): DDIMER in the last 72 hours. Hgb A1c No results for input(s): HGBA1C in the last 72 hours. Lipid Profile No results for input(s): CHOL, HDL, LDLCALC, TRIG, CHOLHDL, LDLDIRECT in the last 72 hours. Thyroid function studies No results for input(s): TSH, T4TOTAL, T3FREE, THYROIDAB in the last 72 hours.  Invalid input(s): FREET3 Anemia work up No results for input(s): VITAMINB12, FOLATE,  FERRITIN, TIBC, IRON, RETICCTPCT in the last 72 hours. Urinalysis    Component Value Date/Time   COLORURINE AMBER (A) 09/11/2016 0003   APPEARANCEUR HAZY (A) 09/11/2016 0003   LABSPEC 1.028 09/11/2016 0003   PHURINE 5.0 09/11/2016 0003   GLUCOSEU >=500 (A) 09/11/2016 0003   HGBUR SMALL (A) 09/11/2016 0003   BILIRUBINUR SMALL (A) 09/11/2016 0003   KETONESUR 5 (A) 09/11/2016 0003   PROTEINUR >=300 (A) 09/11/2016 0003   UROBILINOGEN 0.2 07/20/2014 2228   NITRITE NEGATIVE 09/11/2016 0003   LEUKOCYTESUR NEGATIVE 09/11/2016 0003   Sepsis Labs Invalid input(s): PROCALCITONIN,  WBC,  LACTICIDVEN Microbiology Recent Results (from the past 240 hour(s))  MRSA PCR Screening     Status: None   Collection Time: 09/11/16  4:15 AM  Result Value Ref Range Status   MRSA by PCR NEGATIVE NEGATIVE Final    Comment:        The GeneXpert MRSA Assay (FDA approved for NASAL specimens only), is one component of a comprehensive MRSA colonization surveillance program. It is not intended to diagnose MRSA infection nor to guide or monitor treatment for MRSA infections.      Time coordinating discharge: 25 minutes  SIGNED:  Dessa Phi, DO [facility] Pager [phone]  If 7PM-7AM, please contact night-coverage [email or web address] Password [facility] 09/12/2016, 10:28 AM

## 2016-09-12 NOTE — NC FL2 (Signed)
[place] MEDICAID FL2 LEVEL OF CARE SCREENING TOOL     IDENTIFICATION  Patient Name: [name] Birthdate: 02/04/1941 Sex: female Admission Date (Current Location): 09/10/2016  [facility] and [place] Number:  Herbalist and Address:  [facility],  [place], [place]      Provider Number: [id number]  Attending Physician Name and Address:  Dessa Phi Chahn-Yan*  Relative Name and Phone Number:  Maryann, [place] Daughter [phone]     Current Level of Care: SNF Recommended Level of Care: [place] Prior Approval Number:    Date Approved/Denied:   PASRR Number:  [id number]  Discharge Plan: SNF    Current Diagnoses: Patient Active Problem List   Diagnosis Date Noted  . Hypertensive urgency 09/11/2016  . Dental abscess 09/11/2016  . Hypokalemia 09/11/2016  . 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 Acute post-hemorrhagic anemia 07/09/2016  . Symptomatic anemia 07/09/2016  . 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 Abdominal pain, chronic, epigastric 08/10/2015  . Dehydration   . Leukocytosis   . Aspiration pneumonia ([place]) 07/21/2014  . DM type 2 (diabetes mellitus, type 2) ([place]) 07/21/2014  . Palliative care encounter 07/21/2014  . Dysphagia 07/09/2014  . Decubitus ulcer 07/09/2014  . Blood poisoning   . Acute respiratory failure with hypoxia ([place]) 06/30/2014  . Septic shock ([place]) 06/30/2014  . Lactic acidosis 06/30/2014  . Dementia 06/30/2014  . Acute respiratory failure with hypoxemia ([place])   . Hypernatremia   . Acute kidney injury ([place]) 05/28/2014  . Fever 05/27/2014  . Lower urinary tract infectious disease 05/27/2014  . Sepsis ([place]) 05/27/2014  . Acute encephalopathy   . FTT (failure to thrive) in adult   . Gait instability 07/18/2013    Class: Acute  . Low back pain 07/18/2013    Class: Acute  . Nausea with vomiting 07/18/2013    Class: Acute  .  Abnormality of gait 04/29/2013  . 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 Head trauma 06/03/2010  . HYPERCHOLESTEROLEMIA 05/15/2008  . ANXIETY DEPRESSION 05/15/2008  . DEPRESSION 05/15/2008  . Essential hypertension 05/15/2008  . OSTEOARTHRITIS 05/15/2008  . Edema 05/15/2008  . DYSPNEA ON EXERTION 05/15/2008  . ELECTROCARDIOGRAM, ABNORMAL 05/15/2008    Orientation RESPIRATION BLADDER Height & Weight     Self  Normal Incontinent Weight: 210 lb (95.3 kg) Height:  5\' 7"  (170.2 cm)  BEHAVIORAL SYMPTOMS/MOOD NEUROLOGICAL BOWEL NUTRITION STATUS      Incontinent Diet (Carb modified )  AMBULATORY STATUS COMMUNICATION OF NEEDS Skin   Limited Assist Verbally Normal                       Personal Care Assistance Level of Assistance  Bathing, Feeding, Dressing Bathing Assistance: Maximum assistance Feeding assistance: Independent Dressing Assistance: Maximum assistance     Functional Limitations Info  Sight, Hearing, Speech Sight Info: Adequate Hearing Info: Adequate Speech Info: Adequate    SPECIAL CARE FACTORS FREQUENCY  PT (By licensed PT)                    Contractures Contractures Info: Not present    Additional Factors Info  Code Status, Allergies, Psychotropic, Insulin Sliding Scale Code Status Info: DNR Allergies Info: Morphine And Related, Oxycodone, Sulfonamide Derivatives Psychotropic Info: Cymbalta Insulin Sliding Scale Info: 0-15 Units/Every 4 hours       Current Medications (09/12/2016):  This is the current hospital active medication list Current  Facility-Administered Medications  Medication Dose Route Frequency Provider Last Rate Last Dose  . acetaminophen (TYLENOL) tablet 650 mg  650 mg Oral Q6H PRN Opyd, Ilene Qua, MD       Or  . acetaminophen (TYLENOL) suppository 650 mg  650 mg Rectal Q6H PRN Opyd, Ilene Qua, MD      . ALPRAZolam Duanne Moron) tablet 0.25 mg  0.25 mg Oral BID PRN Opyd, Ilene Qua, MD      .  amLODipine (NORVASC) tablet 5 mg  5 mg Oral Daily Opyd, Ilene Qua, MD   5 mg at 09/11/16 1115  . Ampicillin-Sulbactam (UNASYN) 3 g in sodium chloride 0.9 % 100 mL IVPB  3 g Intravenous Q6H Opyd, Ilene Qua, MD 200 mL/hr at 09/12/16 0227 3 g at 09/12/16 0227  . aspirin chewable tablet 81 mg  81 mg Oral Daily Opyd, Ilene Qua, MD      . bisacodyl (DULCOLAX) suppository 10 mg  10 mg Rectal Daily PRN Opyd, Ilene Qua, MD      . docusate sodium (COLACE) capsule 100 mg  100 mg Oral BID Opyd, Ilene Qua, MD   100 mg at 09/11/16 2233  . DULoxetine (CYMBALTA) DR capsule 30 mg  30 mg Oral BID Opyd, Ilene Qua, MD   30 mg at 09/11/16 2233  . feeding supplement (ENSURE ENLIVE) (ENSURE ENLIVE) liquid 120 mL  120 mL Oral BID Opyd, Ilene Qua, MD   120 mL at 09/11/16 2234  . heparin injection 5,000 Units  5,000 Units Subcutaneous Q8H Opyd, Ilene Qua, MD   5,000 Units at 09/12/16 0514  . hydrALAZINE (APRESOLINE) injection 10 mg  10 mg Intravenous Q4H PRN Opyd, Ilene Qua, MD      . insulin aspart (novoLOG) injection 0-15 Units  0-15 Units Subcutaneous Q4H Opyd, Ilene Qua, MD   5 Units at 09/12/16 0013  . insulin detemir (LEVEMIR) injection 12 Units  12 Units Subcutaneous QHS Vianne Bulls, MD   12 Units at 09/11/16 2234  . loratadine (CLARITIN) tablet 10 mg  10 mg Oral Daily Opyd, Ilene Qua, MD   10 mg at 09/11/16 1100  . magnesium oxide (MAG-OX) tablet 400 mg  400 mg Oral Daily Opyd, Ilene Qua, MD   400 mg at 09/11/16 1100  . methocarbamol (ROBAXIN) tablet 500 mg  500 mg Oral Q6H PRN Opyd, Ilene Qua, MD      . metoprolol succinate (TOPROL-XL) 24 hr tablet 50 mg  50 mg Oral Daily Opyd, Ilene Qua, MD   50 mg at 09/11/16 1115  . ondansetron (ZOFRAN) tablet 4 mg  4 mg Oral Q6H PRN Opyd, Ilene Qua, MD       Or  . ondansetron (ZOFRAN) injection 4 mg  4 mg Intravenous Q6H PRN Opyd, Ilene Qua, MD      . oxyCODONE (Oxy IR/ROXICODONE) immediate release tablet 5 mg  5 mg Oral Q4H PRN Opyd, Ilene Qua, MD      . pantoprazole  (PROTONIX) EC tablet 40 mg  40 mg Oral Daily Opyd, Ilene Qua, MD   40 mg at 09/11/16 1100  . polyethylene glycol (MIRALAX / GLYCOLAX) packet 17 g  17 g Oral Daily Opyd, Ilene Qua, MD   17 g at 09/11/16 1100  . potassium chloride SA (K-DUR,KLOR-CON) CR tablet 40 mEq  40 mEq Oral Q4H Choi, Jennifer Chahn-Yang, DO      . pramipexole (MIRAPEX) tablet 1.5 mg  1.5 mg Oral TID Opyd, Ilene Qua, MD   1.5 mg at  09/11/16 2234  . traMADol (ULTRAM) tablet 50 mg  50 mg Oral Q6H PRN Opyd, Ilene Qua, MD         Discharge Medications: Please see discharge summary for a list of discharge medications.  Relevant Imaging Results:  Relevant Lab Results:   Additional Information ssn:[id number]  Lia Hopping, LCSW

## 2016-09-12 NOTE — Progress Notes (Signed)
Pt discharged back to [facility] by ambulance. Daughter at bedside.IVs discontinued. Pt continues to be confused at times.

## 2016-09-12 NOTE — Clinical Social Work Note (Signed)
Clinical Social Work Assessment  Patient Details  Name: [name] MRN: [id number] Date of Birth: 06/18/1940  Date of referral:  09/12/16               Reason for consult:  Facility Placement                Permission sought to share information with:  Family Supports Permission granted to share information::  Yes, Verbal Permission Granted  Name::       Surveyor, mining::   [facility]  Relationship::   Daughter   Contact Information:    [phone]   Housing/Transportation Living arrangements for the past 2 months:  [place] of Information:  Patient Patient Interpreter Needed:  None Criminal Activity/Legal Involvement Pertinent to Current Situation/Hospitalization:  No - Comment as needed Significant Relationships:  Adult Children Lives with:  Facility Resident Do you feel safe going back to the place where you live?  Yes Need for family participation in patient care:  Yes (Comment)  Care giving concerns: Increased confusion, restlessness. Patient from [facility] will return at discharge.    Social Worker assessment / plan:  Patient has dementia. CSW spoke with patient daughter by phone. She reports the patient is a resident at [facility] and the plan is for her to return.  She has no concerns about pt. Care at this time. CSW spoke with facility liaison and confirmed placement.  FL2 completed.   Employment status:  Retired Forensic scientist:  Medicare PT Recommendations:  [place] / Referral to community resources:  [place]  Patient/Family's Response to care: Agreeable and responding well to care.   Patient/Family's Understanding of and Emotional Response to Diagnosis, Current Treatment, and Prognosis: "Call me anytime about her care, I am her Legal Guardian so please keep me inform about care."  Emotional Assessment Appearance:    Attitude/Demeanor/Rapport:    Affect (typically  observed):  Accepting, Calm Orientation:  Oriented to Self Alcohol / Substance use:  Not Applicable Psych involvement (Current and /or in the community):  No (Comment)  Discharge Needs  Concerns to be addressed:  Discharge Planning Concerns, Care Coordination Readmission within the last 30 days:  No Current discharge risk:  None Barriers to Discharge:  No Barriers Identified   Lia Hopping, LCSW 09/12/2016, 10:22 AM

## 2016-09-12 NOTE — Care Management Note (Signed)
Case Management Note  Patient Details  Name: [name] MRN: [id number] Date of Birth: [birth year]  Subjective/Objective:          AMS          Action/Plan: Date:  September 12 2016  Chart reviewed for concurrent status and case management needs.  Will continue to follow patient progress.  Discharge Planning: following for needs [facility] Expected discharge date: [id number]  Velva Harman, BSN, [place], [place]   Expected Discharge Date:                  Expected Discharge Plan:  [place]  In-House Referral:  Clinical Social Work  Discharge planning Services  CM Consult  Post Acute Care Choice:    Choice offered to:     DME Arranged:    DME Agency:     HH Arranged:    [place] Agency:     Status of Service:  In process, will continue to follow  If discussed at Long Length of Stay Meetings, dates discussed:    Additional Comments:  Leeroy Cha, RN 09/12/2016, 9:37 AM

## 2016-09-12 NOTE — Evaluation (Signed)
Physical Therapy Evaluation-1x Patient Details Name: [name] MRN: [id number] DOB: [birth year] Today's Date: 09/12/2016   History of Present Illness  76 yo female admitted with acute encephalopathy. Hx of ORIF R knee periprosthetic fx 07/06/16, dementia, DM, gait instability, falls. Pt is from SNF    Clinical Impression  On eval, pt required Min assist +2 for bed mobility. Pt rolled x 2, to L and R sides with multimodal cueing and hand over hand placement on bedrails. Total assist for hygiene due to urine incontinence. Pt was noted to be anxious due to unfamiliar environment and faces. No family present during session. Pt is from SNF. Recommend return to SNF.     Follow Up Recommendations SNF    Equipment Recommendations  None recommended by PT    Recommendations for Other Services       Precautions / Restrictions Precautions Precautions: Fall Restrictions Weight Bearing Restrictions:  (unsure of updated WB status since fx in 06/2016)      Mobility  Bed Mobility Overal bed mobility: Needs Assistance Bed Mobility: Rolling Rolling: Min assist;+2 for physical assistance;+2 for safety/equipment         General bed mobility comments: Assist for hand placement and positioning. Multimodal cueing required.   Transfers                 General transfer comment: NT-unsure of updated WB status  Ambulation/Gait                Stairs            Wheelchair Mobility    Modified Rankin (Stroke Patients Only)       Balance                                             Pertinent Vitals/Pain Pain Assessment: No/denies pain    Home Living Family/patient expects to be discharged to:: Skilled nursing facility                      Prior Function Level of Independence: Needs assistance   Gait / Transfers Assistance Needed: per RN, transfers only. Pt uses wheelchair primarily.           Hand Dominance         Extremity/Trunk Assessment   Upper Extremity Assessment Upper Extremity Assessment: Generalized weakness    Lower Extremity Assessment Lower Extremity Assessment: Generalized weakness       Communication      Cognition Arousal/Alertness: Awake/alert Behavior During Therapy: Anxious Overall Cognitive Status: History of cognitive impairments - at baseline                                        General Comments      Exercises General Exercises - Lower Extremity Ankle Circles/Pumps: AAROM;Both;5 reps;Supine   Assessment/Plan    PT Assessment All further PT needs can be met in the next venue of care (SNF)  PT Problem List Decreased mobility;Decreased strength;Decreased balance;Decreased cognition;Decreased activity tolerance;Obesity       PT Treatment Interventions      PT Goals (Current goals can be found in the Care Plan section)  Acute Rehab PT Goals Patient Stated Goal: pt unable. Per chart, pt is from SNF and will likely return PT  Goal Formulation: All assessment and education complete, DC therapy    Frequency     Barriers to discharge        Co-evaluation               AM-PAC PT "6 Clicks" Daily Activity  Outcome Measure Difficulty turning over in bed (including adjusting bedclothes, sheets and blankets)?: Total Difficulty moving from lying on back to sitting on the side of the bed? : Total Difficulty sitting down on and standing up from a chair with arms (e.g., wheelchair, bedside commode, etc,.)?: Total Help needed moving to and from a bed to chair (including a wheelchair)?: Total Help needed walking in hospital room?: Total Help needed climbing 3-5 steps with a railing? : Total 6 Click Score: 6    End of Session   Activity Tolerance: Patient tolerated treatment well Patient left: in bed;with call bell/phone within reach;with bed alarm set   PT Visit Diagnosis: Muscle weakness (generalized) (M62.81);Difficulty in walking, not  elsewhere classified (R26.2)    Time: 9432-7614 PT Time Calculation (min) (ACUTE ONLY): 15 min   Charges:   PT Evaluation $PT Eval Moderate Complexity: 1 Procedure     PT G Codes:          Weston Anna, MPT Pager: [phone]

## 2016-09-13 DIAGNOSIS — F0390 Unspecified dementia without behavioral disturbance: Secondary | ICD-10-CM | POA: Diagnosis not present

## 2016-09-13 DIAGNOSIS — K047 Periapical abscess without sinus: Secondary | ICD-10-CM | POA: Diagnosis not present

## 2016-09-13 DIAGNOSIS — E119 Type 2 diabetes mellitus without complications: Secondary | ICD-10-CM | POA: Diagnosis not present

## 2016-09-13 DIAGNOSIS — I10 Essential (primary) hypertension: Secondary | ICD-10-CM | POA: Diagnosis not present

## 2016-09-14 DIAGNOSIS — I10 Essential (primary) hypertension: Secondary | ICD-10-CM | POA: Diagnosis not present

## 2016-09-14 DIAGNOSIS — E119 Type 2 diabetes mellitus without complications: Secondary | ICD-10-CM | POA: Diagnosis not present

## 2016-09-14 DIAGNOSIS — F329 Major depressive disorder, single episode, unspecified: Secondary | ICD-10-CM | POA: Diagnosis not present

## 2016-09-14 DIAGNOSIS — G9340 Encephalopathy, unspecified: Secondary | ICD-10-CM | POA: Diagnosis not present

## 2016-09-21 DIAGNOSIS — F4323 Adjustment disorder with mixed anxiety and depressed mood: Secondary | ICD-10-CM | POA: Diagnosis not present

## 2016-09-21 LAB — HEMOGLOBIN A1C
HEMOGLOBIN A1C: 10.8 % — AB (ref 4.8–5.6)
Mean Plasma Glucose: 263 mg/dL

## 2016-10-14 DIAGNOSIS — K047 Periapical abscess without sinus: Secondary | ICD-10-CM | POA: Diagnosis not present

## 2016-10-14 DIAGNOSIS — E119 Type 2 diabetes mellitus without complications: Secondary | ICD-10-CM | POA: Diagnosis not present

## 2016-10-14 DIAGNOSIS — S72401D Unspecified fracture of lower end of right femur, subsequent encounter for closed fracture with routine healing: Secondary | ICD-10-CM | POA: Diagnosis not present

## 2016-10-14 DIAGNOSIS — F419 Anxiety disorder, unspecified: Secondary | ICD-10-CM | POA: Diagnosis not present

## 2016-10-14 DIAGNOSIS — R1110 Vomiting, unspecified: Secondary | ICD-10-CM | POA: Diagnosis not present

## 2016-10-19 DIAGNOSIS — F0281 Dementia in other diseases classified elsewhere with behavioral disturbance: Secondary | ICD-10-CM | POA: Diagnosis not present

## 2016-10-19 DIAGNOSIS — G301 Alzheimer's disease with late onset: Secondary | ICD-10-CM | POA: Diagnosis not present

## 2016-10-19 DIAGNOSIS — F331 Major depressive disorder, recurrent, moderate: Secondary | ICD-10-CM | POA: Diagnosis not present

## 2016-10-20 DIAGNOSIS — F0390 Unspecified dementia without behavioral disturbance: Secondary | ICD-10-CM | POA: Diagnosis not present

## 2016-10-20 DIAGNOSIS — F331 Major depressive disorder, recurrent, moderate: Secondary | ICD-10-CM | POA: Diagnosis not present

## 2016-11-02 DIAGNOSIS — F0281 Dementia in other diseases classified elsewhere with behavioral disturbance: Secondary | ICD-10-CM | POA: Diagnosis not present

## 2016-11-02 DIAGNOSIS — G301 Alzheimer's disease with late onset: Secondary | ICD-10-CM | POA: Diagnosis not present

## 2016-11-02 DIAGNOSIS — F331 Major depressive disorder, recurrent, moderate: Secondary | ICD-10-CM | POA: Diagnosis not present

## 2016-11-09 DIAGNOSIS — F418 Other specified anxiety disorders: Secondary | ICD-10-CM | POA: Diagnosis not present

## 2016-11-09 DIAGNOSIS — I10 Essential (primary) hypertension: Secondary | ICD-10-CM | POA: Diagnosis not present

## 2016-11-09 DIAGNOSIS — E119 Type 2 diabetes mellitus without complications: Secondary | ICD-10-CM | POA: Diagnosis not present

## 2016-11-09 DIAGNOSIS — E7800 Pure hypercholesterolemia, unspecified: Secondary | ICD-10-CM | POA: Diagnosis not present

## 2016-11-10 DIAGNOSIS — E119 Type 2 diabetes mellitus without complications: Secondary | ICD-10-CM | POA: Diagnosis not present

## 2016-11-10 DIAGNOSIS — L729 Follicular cyst of the skin and subcutaneous tissue, unspecified: Secondary | ICD-10-CM | POA: Diagnosis not present

## 2016-11-10 DIAGNOSIS — F329 Major depressive disorder, single episode, unspecified: Secondary | ICD-10-CM | POA: Diagnosis not present

## 2016-11-10 DIAGNOSIS — G2581 Restless legs syndrome: Secondary | ICD-10-CM | POA: Diagnosis not present

## 2016-11-10 DIAGNOSIS — I10 Essential (primary) hypertension: Secondary | ICD-10-CM | POA: Diagnosis not present

## 2016-11-17 DIAGNOSIS — S72401D Unspecified fracture of lower end of right femur, subsequent encounter for closed fracture with routine healing: Secondary | ICD-10-CM | POA: Diagnosis not present

## 2016-11-18 DIAGNOSIS — F331 Major depressive disorder, recurrent, moderate: Secondary | ICD-10-CM | POA: Diagnosis not present

## 2016-11-18 DIAGNOSIS — F0390 Unspecified dementia without behavioral disturbance: Secondary | ICD-10-CM | POA: Diagnosis not present

## 2016-11-21 ENCOUNTER — Ambulatory Visit: Payer: Medicare Other | Admitting: Podiatry

## 2016-12-29 DIAGNOSIS — E119 Type 2 diabetes mellitus without complications: Secondary | ICD-10-CM | POA: Diagnosis not present

## 2016-12-29 DIAGNOSIS — Z79899 Other long term (current) drug therapy: Secondary | ICD-10-CM | POA: Diagnosis not present

## 2016-12-29 DIAGNOSIS — W19XXXD Unspecified fall, subsequent encounter: Secondary | ICD-10-CM | POA: Diagnosis not present

## 2016-12-29 DIAGNOSIS — I10 Essential (primary) hypertension: Secondary | ICD-10-CM | POA: Diagnosis not present

## 2016-12-29 DIAGNOSIS — F0390 Unspecified dementia without behavioral disturbance: Secondary | ICD-10-CM | POA: Diagnosis not present

## 2016-12-29 DIAGNOSIS — R319 Hematuria, unspecified: Secondary | ICD-10-CM | POA: Diagnosis not present

## 2016-12-29 DIAGNOSIS — N390 Urinary tract infection, site not specified: Secondary | ICD-10-CM | POA: Diagnosis not present

## 2017-01-04 DIAGNOSIS — E7800 Pure hypercholesterolemia, unspecified: Secondary | ICD-10-CM | POA: Diagnosis not present

## 2017-01-04 DIAGNOSIS — I10 Essential (primary) hypertension: Secondary | ICD-10-CM | POA: Diagnosis not present

## 2017-01-04 DIAGNOSIS — F418 Other specified anxiety disorders: Secondary | ICD-10-CM | POA: Diagnosis not present

## 2017-01-04 DIAGNOSIS — E119 Type 2 diabetes mellitus without complications: Secondary | ICD-10-CM | POA: Diagnosis not present

## 2017-01-18 DIAGNOSIS — G301 Alzheimer's disease with late onset: Secondary | ICD-10-CM | POA: Diagnosis not present

## 2017-01-18 DIAGNOSIS — F331 Major depressive disorder, recurrent, moderate: Secondary | ICD-10-CM | POA: Diagnosis not present

## 2017-01-18 DIAGNOSIS — F0281 Dementia in other diseases classified elsewhere with behavioral disturbance: Secondary | ICD-10-CM | POA: Diagnosis not present

## 2017-01-19 DIAGNOSIS — E119 Type 2 diabetes mellitus without complications: Secondary | ICD-10-CM | POA: Diagnosis not present

## 2017-01-19 DIAGNOSIS — H52203 Unspecified astigmatism, bilateral: Secondary | ICD-10-CM | POA: Diagnosis not present

## 2017-01-19 DIAGNOSIS — Z961 Presence of intraocular lens: Secondary | ICD-10-CM | POA: Diagnosis not present

## 2017-01-20 DIAGNOSIS — F0390 Unspecified dementia without behavioral disturbance: Secondary | ICD-10-CM | POA: Diagnosis not present

## 2017-01-20 DIAGNOSIS — F4323 Adjustment disorder with mixed anxiety and depressed mood: Secondary | ICD-10-CM | POA: Diagnosis not present

## 2017-01-20 DIAGNOSIS — F331 Major depressive disorder, recurrent, moderate: Secondary | ICD-10-CM | POA: Diagnosis not present

## 2017-01-27 DIAGNOSIS — I10 Essential (primary) hypertension: Secondary | ICD-10-CM | POA: Diagnosis not present

## 2017-01-27 DIAGNOSIS — E119 Type 2 diabetes mellitus without complications: Secondary | ICD-10-CM | POA: Diagnosis not present

## 2017-01-27 DIAGNOSIS — D649 Anemia, unspecified: Secondary | ICD-10-CM | POA: Diagnosis not present

## 2017-02-02 DIAGNOSIS — E000 Congenital iodine-deficiency syndrome, neurological type: Secondary | ICD-10-CM | POA: Diagnosis not present

## 2017-02-02 DIAGNOSIS — E039 Hypothyroidism, unspecified: Secondary | ICD-10-CM | POA: Diagnosis not present

## 2017-02-02 DIAGNOSIS — F0390 Unspecified dementia without behavioral disturbance: Secondary | ICD-10-CM | POA: Diagnosis not present

## 2017-02-02 DIAGNOSIS — E119 Type 2 diabetes mellitus without complications: Secondary | ICD-10-CM | POA: Diagnosis not present

## 2017-02-02 DIAGNOSIS — R509 Fever, unspecified: Secondary | ICD-10-CM | POA: Diagnosis not present

## 2017-02-02 DIAGNOSIS — I10 Essential (primary) hypertension: Secondary | ICD-10-CM | POA: Diagnosis not present

## 2017-02-02 DIAGNOSIS — D649 Anemia, unspecified: Secondary | ICD-10-CM | POA: Diagnosis not present

## 2017-02-03 DIAGNOSIS — N390 Urinary tract infection, site not specified: Secondary | ICD-10-CM | POA: Diagnosis not present

## 2017-02-03 DIAGNOSIS — R319 Hematuria, unspecified: Secondary | ICD-10-CM | POA: Diagnosis not present

## 2017-02-22 DIAGNOSIS — E119 Type 2 diabetes mellitus without complications: Secondary | ICD-10-CM | POA: Diagnosis not present

## 2017-02-22 DIAGNOSIS — R509 Fever, unspecified: Secondary | ICD-10-CM | POA: Diagnosis not present

## 2017-02-22 DIAGNOSIS — I10 Essential (primary) hypertension: Secondary | ICD-10-CM | POA: Diagnosis not present

## 2017-02-22 DIAGNOSIS — R319 Hematuria, unspecified: Secondary | ICD-10-CM | POA: Diagnosis not present

## 2017-02-22 DIAGNOSIS — F0390 Unspecified dementia without behavioral disturbance: Secondary | ICD-10-CM | POA: Diagnosis not present

## 2017-02-22 DIAGNOSIS — N390 Urinary tract infection, site not specified: Secondary | ICD-10-CM | POA: Diagnosis not present

## 2017-02-23 DIAGNOSIS — F419 Anxiety disorder, unspecified: Secondary | ICD-10-CM | POA: Diagnosis not present

## 2017-02-23 DIAGNOSIS — I10 Essential (primary) hypertension: Secondary | ICD-10-CM | POA: Diagnosis not present

## 2017-02-23 DIAGNOSIS — E119 Type 2 diabetes mellitus without complications: Secondary | ICD-10-CM | POA: Diagnosis not present

## 2017-02-23 DIAGNOSIS — N390 Urinary tract infection, site not specified: Secondary | ICD-10-CM | POA: Diagnosis not present

## 2017-02-27 DIAGNOSIS — R41841 Cognitive communication deficit: Secondary | ICD-10-CM | POA: Diagnosis not present

## 2017-02-27 DIAGNOSIS — E1165 Type 2 diabetes mellitus with hyperglycemia: Secondary | ICD-10-CM | POA: Diagnosis not present

## 2017-02-27 DIAGNOSIS — R1312 Dysphagia, oropharyngeal phase: Secondary | ICD-10-CM | POA: Diagnosis not present

## 2017-02-27 DIAGNOSIS — R1311 Dysphagia, oral phase: Secondary | ICD-10-CM | POA: Diagnosis not present

## 2017-02-27 DIAGNOSIS — F0390 Unspecified dementia without behavioral disturbance: Secondary | ICD-10-CM | POA: Diagnosis not present

## 2017-02-27 DIAGNOSIS — R278 Other lack of coordination: Secondary | ICD-10-CM | POA: Diagnosis not present

## 2017-02-27 DIAGNOSIS — N390 Urinary tract infection, site not specified: Secondary | ICD-10-CM | POA: Diagnosis not present

## 2017-02-27 DIAGNOSIS — M6281 Muscle weakness (generalized): Secondary | ICD-10-CM | POA: Diagnosis not present

## 2017-02-27 DIAGNOSIS — R262 Difficulty in walking, not elsewhere classified: Secondary | ICD-10-CM | POA: Diagnosis not present

## 2017-02-28 DIAGNOSIS — R41841 Cognitive communication deficit: Secondary | ICD-10-CM | POA: Diagnosis not present

## 2017-02-28 DIAGNOSIS — R1311 Dysphagia, oral phase: Secondary | ICD-10-CM | POA: Diagnosis not present

## 2017-02-28 DIAGNOSIS — R278 Other lack of coordination: Secondary | ICD-10-CM | POA: Diagnosis not present

## 2017-02-28 DIAGNOSIS — N390 Urinary tract infection, site not specified: Secondary | ICD-10-CM | POA: Diagnosis not present

## 2017-02-28 DIAGNOSIS — R1312 Dysphagia, oropharyngeal phase: Secondary | ICD-10-CM | POA: Diagnosis not present

## 2017-02-28 DIAGNOSIS — M6281 Muscle weakness (generalized): Secondary | ICD-10-CM | POA: Diagnosis not present

## 2017-03-01 DIAGNOSIS — R1312 Dysphagia, oropharyngeal phase: Secondary | ICD-10-CM | POA: Diagnosis not present

## 2017-03-01 DIAGNOSIS — R278 Other lack of coordination: Secondary | ICD-10-CM | POA: Diagnosis not present

## 2017-03-01 DIAGNOSIS — E119 Type 2 diabetes mellitus without complications: Secondary | ICD-10-CM | POA: Diagnosis not present

## 2017-03-01 DIAGNOSIS — I10 Essential (primary) hypertension: Secondary | ICD-10-CM | POA: Diagnosis not present

## 2017-03-01 DIAGNOSIS — R41841 Cognitive communication deficit: Secondary | ICD-10-CM | POA: Diagnosis not present

## 2017-03-01 DIAGNOSIS — M6281 Muscle weakness (generalized): Secondary | ICD-10-CM | POA: Diagnosis not present

## 2017-03-01 DIAGNOSIS — R739 Hyperglycemia, unspecified: Secondary | ICD-10-CM | POA: Diagnosis not present

## 2017-03-01 DIAGNOSIS — F339 Major depressive disorder, recurrent, unspecified: Secondary | ICD-10-CM | POA: Diagnosis not present

## 2017-03-01 DIAGNOSIS — N390 Urinary tract infection, site not specified: Secondary | ICD-10-CM | POA: Diagnosis not present

## 2017-03-01 DIAGNOSIS — E7800 Pure hypercholesterolemia, unspecified: Secondary | ICD-10-CM | POA: Diagnosis not present

## 2017-03-01 DIAGNOSIS — R1311 Dysphagia, oral phase: Secondary | ICD-10-CM | POA: Diagnosis not present

## 2017-03-02 DIAGNOSIS — M6281 Muscle weakness (generalized): Secondary | ICD-10-CM | POA: Diagnosis not present

## 2017-03-02 DIAGNOSIS — R1311 Dysphagia, oral phase: Secondary | ICD-10-CM | POA: Diagnosis not present

## 2017-03-02 DIAGNOSIS — N390 Urinary tract infection, site not specified: Secondary | ICD-10-CM | POA: Diagnosis not present

## 2017-03-02 DIAGNOSIS — R1312 Dysphagia, oropharyngeal phase: Secondary | ICD-10-CM | POA: Diagnosis not present

## 2017-03-02 DIAGNOSIS — R278 Other lack of coordination: Secondary | ICD-10-CM | POA: Diagnosis not present

## 2017-03-02 DIAGNOSIS — R41841 Cognitive communication deficit: Secondary | ICD-10-CM | POA: Diagnosis not present

## 2017-03-03 DIAGNOSIS — M6281 Muscle weakness (generalized): Secondary | ICD-10-CM | POA: Diagnosis not present

## 2017-03-03 DIAGNOSIS — I739 Peripheral vascular disease, unspecified: Secondary | ICD-10-CM | POA: Diagnosis not present

## 2017-03-03 DIAGNOSIS — R1311 Dysphagia, oral phase: Secondary | ICD-10-CM | POA: Diagnosis not present

## 2017-03-03 DIAGNOSIS — B351 Tinea unguium: Secondary | ICD-10-CM | POA: Diagnosis not present

## 2017-03-03 DIAGNOSIS — R1312 Dysphagia, oropharyngeal phase: Secondary | ICD-10-CM | POA: Diagnosis not present

## 2017-03-03 DIAGNOSIS — N390 Urinary tract infection, site not specified: Secondary | ICD-10-CM | POA: Diagnosis not present

## 2017-03-03 DIAGNOSIS — R262 Difficulty in walking, not elsewhere classified: Secondary | ICD-10-CM | POA: Diagnosis not present

## 2017-03-03 DIAGNOSIS — R278 Other lack of coordination: Secondary | ICD-10-CM | POA: Diagnosis not present

## 2017-03-03 DIAGNOSIS — R41841 Cognitive communication deficit: Secondary | ICD-10-CM | POA: Diagnosis not present

## 2017-03-04 DIAGNOSIS — R1311 Dysphagia, oral phase: Secondary | ICD-10-CM | POA: Diagnosis not present

## 2017-03-04 DIAGNOSIS — R278 Other lack of coordination: Secondary | ICD-10-CM | POA: Diagnosis not present

## 2017-03-04 DIAGNOSIS — R41841 Cognitive communication deficit: Secondary | ICD-10-CM | POA: Diagnosis not present

## 2017-03-04 DIAGNOSIS — R1312 Dysphagia, oropharyngeal phase: Secondary | ICD-10-CM | POA: Diagnosis not present

## 2017-03-04 DIAGNOSIS — N390 Urinary tract infection, site not specified: Secondary | ICD-10-CM | POA: Diagnosis not present

## 2017-03-04 DIAGNOSIS — M6281 Muscle weakness (generalized): Secondary | ICD-10-CM | POA: Diagnosis not present

## 2017-03-07 DIAGNOSIS — M6281 Muscle weakness (generalized): Secondary | ICD-10-CM | POA: Diagnosis not present

## 2017-03-07 DIAGNOSIS — N390 Urinary tract infection, site not specified: Secondary | ICD-10-CM | POA: Diagnosis not present

## 2017-03-07 DIAGNOSIS — R41841 Cognitive communication deficit: Secondary | ICD-10-CM | POA: Diagnosis not present

## 2017-03-07 DIAGNOSIS — R278 Other lack of coordination: Secondary | ICD-10-CM | POA: Diagnosis not present

## 2017-03-07 DIAGNOSIS — I10 Essential (primary) hypertension: Secondary | ICD-10-CM | POA: Diagnosis not present

## 2017-03-07 DIAGNOSIS — R05 Cough: Secondary | ICD-10-CM | POA: Diagnosis not present

## 2017-03-07 DIAGNOSIS — R1312 Dysphagia, oropharyngeal phase: Secondary | ICD-10-CM | POA: Diagnosis not present

## 2017-03-07 DIAGNOSIS — F0390 Unspecified dementia without behavioral disturbance: Secondary | ICD-10-CM | POA: Diagnosis not present

## 2017-03-07 DIAGNOSIS — R1311 Dysphagia, oral phase: Secondary | ICD-10-CM | POA: Diagnosis not present

## 2017-03-07 DIAGNOSIS — E119 Type 2 diabetes mellitus without complications: Secondary | ICD-10-CM | POA: Diagnosis not present

## 2017-03-08 DIAGNOSIS — R41841 Cognitive communication deficit: Secondary | ICD-10-CM | POA: Diagnosis not present

## 2017-03-08 DIAGNOSIS — R1312 Dysphagia, oropharyngeal phase: Secondary | ICD-10-CM | POA: Diagnosis not present

## 2017-03-08 DIAGNOSIS — M6281 Muscle weakness (generalized): Secondary | ICD-10-CM | POA: Diagnosis not present

## 2017-03-08 DIAGNOSIS — R278 Other lack of coordination: Secondary | ICD-10-CM | POA: Diagnosis not present

## 2017-03-08 DIAGNOSIS — N390 Urinary tract infection, site not specified: Secondary | ICD-10-CM | POA: Diagnosis not present

## 2017-03-08 DIAGNOSIS — R1311 Dysphagia, oral phase: Secondary | ICD-10-CM | POA: Diagnosis not present

## 2017-03-09 DIAGNOSIS — R1311 Dysphagia, oral phase: Secondary | ICD-10-CM | POA: Diagnosis not present

## 2017-03-09 DIAGNOSIS — R1312 Dysphagia, oropharyngeal phase: Secondary | ICD-10-CM | POA: Diagnosis not present

## 2017-03-09 DIAGNOSIS — R41841 Cognitive communication deficit: Secondary | ICD-10-CM | POA: Diagnosis not present

## 2017-03-09 DIAGNOSIS — R278 Other lack of coordination: Secondary | ICD-10-CM | POA: Diagnosis not present

## 2017-03-09 DIAGNOSIS — N390 Urinary tract infection, site not specified: Secondary | ICD-10-CM | POA: Diagnosis not present

## 2017-03-09 DIAGNOSIS — M6281 Muscle weakness (generalized): Secondary | ICD-10-CM | POA: Diagnosis not present

## 2017-03-10 DIAGNOSIS — R278 Other lack of coordination: Secondary | ICD-10-CM | POA: Diagnosis not present

## 2017-03-10 DIAGNOSIS — I10 Essential (primary) hypertension: Secondary | ICD-10-CM | POA: Diagnosis not present

## 2017-03-10 DIAGNOSIS — D649 Anemia, unspecified: Secondary | ICD-10-CM | POA: Diagnosis not present

## 2017-03-10 DIAGNOSIS — M6281 Muscle weakness (generalized): Secondary | ICD-10-CM | POA: Diagnosis not present

## 2017-03-10 DIAGNOSIS — R1311 Dysphagia, oral phase: Secondary | ICD-10-CM | POA: Diagnosis not present

## 2017-03-10 DIAGNOSIS — R1312 Dysphagia, oropharyngeal phase: Secondary | ICD-10-CM | POA: Diagnosis not present

## 2017-03-10 DIAGNOSIS — R41841 Cognitive communication deficit: Secondary | ICD-10-CM | POA: Diagnosis not present

## 2017-03-10 DIAGNOSIS — N390 Urinary tract infection, site not specified: Secondary | ICD-10-CM | POA: Diagnosis not present

## 2017-03-13 DIAGNOSIS — R41841 Cognitive communication deficit: Secondary | ICD-10-CM | POA: Diagnosis not present

## 2017-03-13 DIAGNOSIS — M6281 Muscle weakness (generalized): Secondary | ICD-10-CM | POA: Diagnosis not present

## 2017-03-13 DIAGNOSIS — R1312 Dysphagia, oropharyngeal phase: Secondary | ICD-10-CM | POA: Diagnosis not present

## 2017-03-13 DIAGNOSIS — R278 Other lack of coordination: Secondary | ICD-10-CM | POA: Diagnosis not present

## 2017-03-13 DIAGNOSIS — N390 Urinary tract infection, site not specified: Secondary | ICD-10-CM | POA: Diagnosis not present

## 2017-03-13 DIAGNOSIS — R1311 Dysphagia, oral phase: Secondary | ICD-10-CM | POA: Diagnosis not present

## 2017-03-14 DIAGNOSIS — N390 Urinary tract infection, site not specified: Secondary | ICD-10-CM | POA: Diagnosis not present

## 2017-03-14 DIAGNOSIS — M6281 Muscle weakness (generalized): Secondary | ICD-10-CM | POA: Diagnosis not present

## 2017-03-14 DIAGNOSIS — R1312 Dysphagia, oropharyngeal phase: Secondary | ICD-10-CM | POA: Diagnosis not present

## 2017-03-14 DIAGNOSIS — R41841 Cognitive communication deficit: Secondary | ICD-10-CM | POA: Diagnosis not present

## 2017-03-14 DIAGNOSIS — R278 Other lack of coordination: Secondary | ICD-10-CM | POA: Diagnosis not present

## 2017-03-14 DIAGNOSIS — R1311 Dysphagia, oral phase: Secondary | ICD-10-CM | POA: Diagnosis not present

## 2017-03-15 DIAGNOSIS — M6281 Muscle weakness (generalized): Secondary | ICD-10-CM | POA: Diagnosis not present

## 2017-03-15 DIAGNOSIS — N390 Urinary tract infection, site not specified: Secondary | ICD-10-CM | POA: Diagnosis not present

## 2017-03-15 DIAGNOSIS — R1311 Dysphagia, oral phase: Secondary | ICD-10-CM | POA: Diagnosis not present

## 2017-03-15 DIAGNOSIS — R278 Other lack of coordination: Secondary | ICD-10-CM | POA: Diagnosis not present

## 2017-03-15 DIAGNOSIS — R1312 Dysphagia, oropharyngeal phase: Secondary | ICD-10-CM | POA: Diagnosis not present

## 2017-03-15 DIAGNOSIS — R41841 Cognitive communication deficit: Secondary | ICD-10-CM | POA: Diagnosis not present

## 2017-03-16 DIAGNOSIS — R1312 Dysphagia, oropharyngeal phase: Secondary | ICD-10-CM | POA: Diagnosis not present

## 2017-03-16 DIAGNOSIS — N390 Urinary tract infection, site not specified: Secondary | ICD-10-CM | POA: Diagnosis not present

## 2017-03-16 DIAGNOSIS — M6281 Muscle weakness (generalized): Secondary | ICD-10-CM | POA: Diagnosis not present

## 2017-03-16 DIAGNOSIS — R278 Other lack of coordination: Secondary | ICD-10-CM | POA: Diagnosis not present

## 2017-03-16 DIAGNOSIS — R41841 Cognitive communication deficit: Secondary | ICD-10-CM | POA: Diagnosis not present

## 2017-03-16 DIAGNOSIS — R1311 Dysphagia, oral phase: Secondary | ICD-10-CM | POA: Diagnosis not present

## 2017-03-17 DIAGNOSIS — N390 Urinary tract infection, site not specified: Secondary | ICD-10-CM | POA: Diagnosis not present

## 2017-03-17 DIAGNOSIS — M6281 Muscle weakness (generalized): Secondary | ICD-10-CM | POA: Diagnosis not present

## 2017-03-17 DIAGNOSIS — R1311 Dysphagia, oral phase: Secondary | ICD-10-CM | POA: Diagnosis not present

## 2017-03-17 DIAGNOSIS — R1312 Dysphagia, oropharyngeal phase: Secondary | ICD-10-CM | POA: Diagnosis not present

## 2017-03-17 DIAGNOSIS — R41841 Cognitive communication deficit: Secondary | ICD-10-CM | POA: Diagnosis not present

## 2017-03-17 DIAGNOSIS — R278 Other lack of coordination: Secondary | ICD-10-CM | POA: Diagnosis not present

## 2017-03-22 DIAGNOSIS — F331 Major depressive disorder, recurrent, moderate: Secondary | ICD-10-CM | POA: Diagnosis not present

## 2017-03-22 DIAGNOSIS — G301 Alzheimer's disease with late onset: Secondary | ICD-10-CM | POA: Diagnosis not present

## 2017-03-22 DIAGNOSIS — F0281 Dementia in other diseases classified elsewhere with behavioral disturbance: Secondary | ICD-10-CM | POA: Diagnosis not present

## 2017-03-23 DIAGNOSIS — F331 Major depressive disorder, recurrent, moderate: Secondary | ICD-10-CM | POA: Diagnosis not present

## 2017-03-23 DIAGNOSIS — F0390 Unspecified dementia without behavioral disturbance: Secondary | ICD-10-CM | POA: Diagnosis not present

## 2017-03-23 DIAGNOSIS — F4323 Adjustment disorder with mixed anxiety and depressed mood: Secondary | ICD-10-CM | POA: Diagnosis not present

## 2017-04-12 DIAGNOSIS — F331 Major depressive disorder, recurrent, moderate: Secondary | ICD-10-CM | POA: Diagnosis not present

## 2017-04-12 DIAGNOSIS — G301 Alzheimer's disease with late onset: Secondary | ICD-10-CM | POA: Diagnosis not present

## 2017-04-12 DIAGNOSIS — F0281 Dementia in other diseases classified elsewhere with behavioral disturbance: Secondary | ICD-10-CM | POA: Diagnosis not present

## 2017-04-12 DIAGNOSIS — F064 Anxiety disorder due to known physiological condition: Secondary | ICD-10-CM | POA: Diagnosis not present

## 2017-04-18 DIAGNOSIS — F064 Anxiety disorder due to known physiological condition: Secondary | ICD-10-CM | POA: Diagnosis not present

## 2017-04-18 DIAGNOSIS — F331 Major depressive disorder, recurrent, moderate: Secondary | ICD-10-CM | POA: Diagnosis not present

## 2017-04-19 DIAGNOSIS — F339 Major depressive disorder, recurrent, unspecified: Secondary | ICD-10-CM | POA: Diagnosis not present

## 2017-04-19 DIAGNOSIS — I10 Essential (primary) hypertension: Secondary | ICD-10-CM | POA: Diagnosis not present

## 2017-04-19 DIAGNOSIS — F411 Generalized anxiety disorder: Secondary | ICD-10-CM | POA: Diagnosis not present

## 2017-04-19 DIAGNOSIS — R739 Hyperglycemia, unspecified: Secondary | ICD-10-CM | POA: Diagnosis not present

## 2017-04-19 DIAGNOSIS — E7800 Pure hypercholesterolemia, unspecified: Secondary | ICD-10-CM | POA: Diagnosis not present

## 2017-04-19 DIAGNOSIS — E119 Type 2 diabetes mellitus without complications: Secondary | ICD-10-CM | POA: Diagnosis not present

## 2017-05-31 DIAGNOSIS — D649 Anemia, unspecified: Secondary | ICD-10-CM | POA: Diagnosis not present

## 2017-05-31 DIAGNOSIS — R410 Disorientation, unspecified: Secondary | ICD-10-CM | POA: Diagnosis not present

## 2017-05-31 DIAGNOSIS — F331 Major depressive disorder, recurrent, moderate: Secondary | ICD-10-CM | POA: Diagnosis not present

## 2017-05-31 DIAGNOSIS — F0390 Unspecified dementia without behavioral disturbance: Secondary | ICD-10-CM | POA: Diagnosis not present

## 2017-05-31 DIAGNOSIS — G301 Alzheimer's disease with late onset: Secondary | ICD-10-CM | POA: Diagnosis not present

## 2017-05-31 DIAGNOSIS — I10 Essential (primary) hypertension: Secondary | ICD-10-CM | POA: Diagnosis not present

## 2017-05-31 DIAGNOSIS — W19XXXD Unspecified fall, subsequent encounter: Secondary | ICD-10-CM | POA: Diagnosis not present

## 2017-05-31 DIAGNOSIS — F0281 Dementia in other diseases classified elsewhere with behavioral disturbance: Secondary | ICD-10-CM | POA: Diagnosis not present

## 2017-05-31 DIAGNOSIS — F064 Anxiety disorder due to known physiological condition: Secondary | ICD-10-CM | POA: Diagnosis not present

## 2017-06-01 DIAGNOSIS — N390 Urinary tract infection, site not specified: Secondary | ICD-10-CM | POA: Diagnosis not present

## 2017-06-01 DIAGNOSIS — Z79899 Other long term (current) drug therapy: Secondary | ICD-10-CM | POA: Diagnosis not present

## 2017-06-01 DIAGNOSIS — R319 Hematuria, unspecified: Secondary | ICD-10-CM | POA: Diagnosis not present

## 2017-06-01 DIAGNOSIS — I10 Essential (primary) hypertension: Secondary | ICD-10-CM | POA: Diagnosis not present

## 2017-06-07 DIAGNOSIS — E1065 Type 1 diabetes mellitus with hyperglycemia: Secondary | ICD-10-CM | POA: Diagnosis not present

## 2017-06-07 DIAGNOSIS — I10 Essential (primary) hypertension: Secondary | ICD-10-CM | POA: Diagnosis not present

## 2017-06-07 DIAGNOSIS — F322 Major depressive disorder, single episode, severe without psychotic features: Secondary | ICD-10-CM | POA: Diagnosis not present

## 2017-06-07 DIAGNOSIS — E7800 Pure hypercholesterolemia, unspecified: Secondary | ICD-10-CM | POA: Diagnosis not present

## 2017-06-16 DIAGNOSIS — B351 Tinea unguium: Secondary | ICD-10-CM | POA: Diagnosis not present

## 2017-06-16 DIAGNOSIS — L603 Nail dystrophy: Secondary | ICD-10-CM | POA: Diagnosis not present

## 2017-06-16 DIAGNOSIS — I739 Peripheral vascular disease, unspecified: Secondary | ICD-10-CM | POA: Diagnosis not present

## 2017-06-16 DIAGNOSIS — Q845 Enlarged and hypertrophic nails: Secondary | ICD-10-CM | POA: Diagnosis not present

## 2017-07-05 DIAGNOSIS — F0281 Dementia in other diseases classified elsewhere with behavioral disturbance: Secondary | ICD-10-CM | POA: Diagnosis not present

## 2017-07-05 DIAGNOSIS — F064 Anxiety disorder due to known physiological condition: Secondary | ICD-10-CM | POA: Diagnosis not present

## 2017-07-05 DIAGNOSIS — F331 Major depressive disorder, recurrent, moderate: Secondary | ICD-10-CM | POA: Diagnosis not present

## 2017-07-05 DIAGNOSIS — G301 Alzheimer's disease with late onset: Secondary | ICD-10-CM | POA: Diagnosis not present

## 2017-07-19 DIAGNOSIS — F064 Anxiety disorder due to known physiological condition: Secondary | ICD-10-CM | POA: Diagnosis not present

## 2017-07-19 DIAGNOSIS — F331 Major depressive disorder, recurrent, moderate: Secondary | ICD-10-CM | POA: Diagnosis not present

## 2017-07-19 DIAGNOSIS — G301 Alzheimer's disease with late onset: Secondary | ICD-10-CM | POA: Diagnosis not present

## 2017-07-19 DIAGNOSIS — F0281 Dementia in other diseases classified elsewhere with behavioral disturbance: Secondary | ICD-10-CM | POA: Diagnosis not present

## 2017-07-26 DIAGNOSIS — F0281 Dementia in other diseases classified elsewhere with behavioral disturbance: Secondary | ICD-10-CM | POA: Diagnosis not present

## 2017-07-26 DIAGNOSIS — F331 Major depressive disorder, recurrent, moderate: Secondary | ICD-10-CM | POA: Diagnosis not present

## 2017-07-26 DIAGNOSIS — F064 Anxiety disorder due to known physiological condition: Secondary | ICD-10-CM | POA: Diagnosis not present

## 2017-07-26 DIAGNOSIS — G301 Alzheimer's disease with late onset: Secondary | ICD-10-CM | POA: Diagnosis not present

## 2017-07-27 DIAGNOSIS — E119 Type 2 diabetes mellitus without complications: Secondary | ICD-10-CM | POA: Diagnosis not present

## 2017-07-27 DIAGNOSIS — E039 Hypothyroidism, unspecified: Secondary | ICD-10-CM | POA: Diagnosis not present

## 2017-07-27 DIAGNOSIS — E785 Hyperlipidemia, unspecified: Secondary | ICD-10-CM | POA: Diagnosis not present

## 2017-07-27 DIAGNOSIS — Z79899 Other long term (current) drug therapy: Secondary | ICD-10-CM | POA: Diagnosis not present

## 2017-08-02 DIAGNOSIS — R1312 Dysphagia, oropharyngeal phase: Secondary | ICD-10-CM | POA: Diagnosis not present

## 2017-08-02 DIAGNOSIS — Z79899 Other long term (current) drug therapy: Secondary | ICD-10-CM | POA: Diagnosis not present

## 2017-08-02 DIAGNOSIS — M6281 Muscle weakness (generalized): Secondary | ICD-10-CM | POA: Diagnosis not present

## 2017-08-02 DIAGNOSIS — R278 Other lack of coordination: Secondary | ICD-10-CM | POA: Diagnosis not present

## 2017-08-02 DIAGNOSIS — E1165 Type 2 diabetes mellitus with hyperglycemia: Secondary | ICD-10-CM | POA: Diagnosis not present

## 2017-08-02 DIAGNOSIS — D649 Anemia, unspecified: Secondary | ICD-10-CM | POA: Diagnosis not present

## 2017-08-02 DIAGNOSIS — R262 Difficulty in walking, not elsewhere classified: Secondary | ICD-10-CM | POA: Diagnosis not present

## 2017-08-02 DIAGNOSIS — R7309 Other abnormal glucose: Secondary | ICD-10-CM | POA: Diagnosis not present

## 2017-08-02 DIAGNOSIS — N390 Urinary tract infection, site not specified: Secondary | ICD-10-CM | POA: Diagnosis not present

## 2017-08-02 DIAGNOSIS — E119 Type 2 diabetes mellitus without complications: Secondary | ICD-10-CM | POA: Diagnosis not present

## 2017-08-02 DIAGNOSIS — R1311 Dysphagia, oral phase: Secondary | ICD-10-CM | POA: Diagnosis not present

## 2017-08-02 DIAGNOSIS — E559 Vitamin D deficiency, unspecified: Secondary | ICD-10-CM | POA: Diagnosis not present

## 2017-08-02 DIAGNOSIS — I10 Essential (primary) hypertension: Secondary | ICD-10-CM | POA: Diagnosis not present

## 2017-08-02 DIAGNOSIS — G9340 Encephalopathy, unspecified: Secondary | ICD-10-CM | POA: Diagnosis not present

## 2017-08-02 DIAGNOSIS — R41841 Cognitive communication deficit: Secondary | ICD-10-CM | POA: Diagnosis not present

## 2017-08-03 DIAGNOSIS — N390 Urinary tract infection, site not specified: Secondary | ICD-10-CM | POA: Diagnosis not present

## 2017-08-03 DIAGNOSIS — R278 Other lack of coordination: Secondary | ICD-10-CM | POA: Diagnosis not present

## 2017-08-03 DIAGNOSIS — R1311 Dysphagia, oral phase: Secondary | ICD-10-CM | POA: Diagnosis not present

## 2017-08-03 DIAGNOSIS — R1312 Dysphagia, oropharyngeal phase: Secondary | ICD-10-CM | POA: Diagnosis not present

## 2017-08-03 DIAGNOSIS — R41841 Cognitive communication deficit: Secondary | ICD-10-CM | POA: Diagnosis not present

## 2017-08-03 DIAGNOSIS — M6281 Muscle weakness (generalized): Secondary | ICD-10-CM | POA: Diagnosis not present

## 2017-08-04 DIAGNOSIS — R278 Other lack of coordination: Secondary | ICD-10-CM | POA: Diagnosis not present

## 2017-08-04 DIAGNOSIS — R1312 Dysphagia, oropharyngeal phase: Secondary | ICD-10-CM | POA: Diagnosis not present

## 2017-08-04 DIAGNOSIS — R1311 Dysphagia, oral phase: Secondary | ICD-10-CM | POA: Diagnosis not present

## 2017-08-04 DIAGNOSIS — M6281 Muscle weakness (generalized): Secondary | ICD-10-CM | POA: Diagnosis not present

## 2017-08-04 DIAGNOSIS — R41841 Cognitive communication deficit: Secondary | ICD-10-CM | POA: Diagnosis not present

## 2017-08-04 DIAGNOSIS — N390 Urinary tract infection, site not specified: Secondary | ICD-10-CM | POA: Diagnosis not present

## 2017-08-07 DIAGNOSIS — N390 Urinary tract infection, site not specified: Secondary | ICD-10-CM | POA: Diagnosis not present

## 2017-08-07 DIAGNOSIS — R1311 Dysphagia, oral phase: Secondary | ICD-10-CM | POA: Diagnosis not present

## 2017-08-07 DIAGNOSIS — M79651 Pain in right thigh: Secondary | ICD-10-CM | POA: Diagnosis not present

## 2017-08-07 DIAGNOSIS — M25551 Pain in right hip: Secondary | ICD-10-CM | POA: Diagnosis not present

## 2017-08-07 DIAGNOSIS — M25561 Pain in right knee: Secondary | ICD-10-CM | POA: Diagnosis not present

## 2017-08-07 DIAGNOSIS — R278 Other lack of coordination: Secondary | ICD-10-CM | POA: Diagnosis not present

## 2017-08-07 DIAGNOSIS — R41841 Cognitive communication deficit: Secondary | ICD-10-CM | POA: Diagnosis not present

## 2017-08-07 DIAGNOSIS — R1312 Dysphagia, oropharyngeal phase: Secondary | ICD-10-CM | POA: Diagnosis not present

## 2017-08-07 DIAGNOSIS — M6281 Muscle weakness (generalized): Secondary | ICD-10-CM | POA: Diagnosis not present

## 2017-08-07 DIAGNOSIS — M79661 Pain in right lower leg: Secondary | ICD-10-CM | POA: Diagnosis not present

## 2017-08-08 DIAGNOSIS — R1311 Dysphagia, oral phase: Secondary | ICD-10-CM | POA: Diagnosis not present

## 2017-08-08 DIAGNOSIS — N390 Urinary tract infection, site not specified: Secondary | ICD-10-CM | POA: Diagnosis not present

## 2017-08-08 DIAGNOSIS — R278 Other lack of coordination: Secondary | ICD-10-CM | POA: Diagnosis not present

## 2017-08-08 DIAGNOSIS — M6281 Muscle weakness (generalized): Secondary | ICD-10-CM | POA: Diagnosis not present

## 2017-08-08 DIAGNOSIS — R41841 Cognitive communication deficit: Secondary | ICD-10-CM | POA: Diagnosis not present

## 2017-08-08 DIAGNOSIS — R1312 Dysphagia, oropharyngeal phase: Secondary | ICD-10-CM | POA: Diagnosis not present

## 2017-08-09 DIAGNOSIS — F064 Anxiety disorder due to known physiological condition: Secondary | ICD-10-CM | POA: Diagnosis not present

## 2017-08-09 DIAGNOSIS — F0281 Dementia in other diseases classified elsewhere with behavioral disturbance: Secondary | ICD-10-CM | POA: Diagnosis not present

## 2017-08-09 DIAGNOSIS — G301 Alzheimer's disease with late onset: Secondary | ICD-10-CM | POA: Diagnosis not present

## 2017-08-09 DIAGNOSIS — F331 Major depressive disorder, recurrent, moderate: Secondary | ICD-10-CM | POA: Diagnosis not present

## 2017-08-10 DIAGNOSIS — R41841 Cognitive communication deficit: Secondary | ICD-10-CM | POA: Diagnosis not present

## 2017-08-10 DIAGNOSIS — R1312 Dysphagia, oropharyngeal phase: Secondary | ICD-10-CM | POA: Diagnosis not present

## 2017-08-10 DIAGNOSIS — R1311 Dysphagia, oral phase: Secondary | ICD-10-CM | POA: Diagnosis not present

## 2017-08-10 DIAGNOSIS — M6281 Muscle weakness (generalized): Secondary | ICD-10-CM | POA: Diagnosis not present

## 2017-08-10 DIAGNOSIS — N390 Urinary tract infection, site not specified: Secondary | ICD-10-CM | POA: Diagnosis not present

## 2017-08-10 DIAGNOSIS — R278 Other lack of coordination: Secondary | ICD-10-CM | POA: Diagnosis not present

## 2017-08-11 DIAGNOSIS — R1312 Dysphagia, oropharyngeal phase: Secondary | ICD-10-CM | POA: Diagnosis not present

## 2017-08-11 DIAGNOSIS — M6281 Muscle weakness (generalized): Secondary | ICD-10-CM | POA: Diagnosis not present

## 2017-08-11 DIAGNOSIS — R278 Other lack of coordination: Secondary | ICD-10-CM | POA: Diagnosis not present

## 2017-08-11 DIAGNOSIS — R1311 Dysphagia, oral phase: Secondary | ICD-10-CM | POA: Diagnosis not present

## 2017-08-11 DIAGNOSIS — R41841 Cognitive communication deficit: Secondary | ICD-10-CM | POA: Diagnosis not present

## 2017-08-11 DIAGNOSIS — N390 Urinary tract infection, site not specified: Secondary | ICD-10-CM | POA: Diagnosis not present

## 2017-08-12 DIAGNOSIS — M6281 Muscle weakness (generalized): Secondary | ICD-10-CM | POA: Diagnosis not present

## 2017-08-12 DIAGNOSIS — N390 Urinary tract infection, site not specified: Secondary | ICD-10-CM | POA: Diagnosis not present

## 2017-08-12 DIAGNOSIS — R278 Other lack of coordination: Secondary | ICD-10-CM | POA: Diagnosis not present

## 2017-08-12 DIAGNOSIS — F339 Major depressive disorder, recurrent, unspecified: Secondary | ICD-10-CM | POA: Diagnosis not present

## 2017-08-12 DIAGNOSIS — R41841 Cognitive communication deficit: Secondary | ICD-10-CM | POA: Diagnosis not present

## 2017-08-12 DIAGNOSIS — R1311 Dysphagia, oral phase: Secondary | ICD-10-CM | POA: Diagnosis not present

## 2017-08-12 DIAGNOSIS — E1169 Type 2 diabetes mellitus with other specified complication: Secondary | ICD-10-CM | POA: Diagnosis not present

## 2017-08-12 DIAGNOSIS — F411 Generalized anxiety disorder: Secondary | ICD-10-CM | POA: Diagnosis not present

## 2017-08-12 DIAGNOSIS — I10 Essential (primary) hypertension: Secondary | ICD-10-CM | POA: Diagnosis not present

## 2017-08-12 DIAGNOSIS — E7800 Pure hypercholesterolemia, unspecified: Secondary | ICD-10-CM | POA: Diagnosis not present

## 2017-08-12 DIAGNOSIS — R1312 Dysphagia, oropharyngeal phase: Secondary | ICD-10-CM | POA: Diagnosis not present

## 2017-08-14 DIAGNOSIS — N390 Urinary tract infection, site not specified: Secondary | ICD-10-CM | POA: Diagnosis not present

## 2017-08-14 DIAGNOSIS — R1311 Dysphagia, oral phase: Secondary | ICD-10-CM | POA: Diagnosis not present

## 2017-08-14 DIAGNOSIS — M6281 Muscle weakness (generalized): Secondary | ICD-10-CM | POA: Diagnosis not present

## 2017-08-14 DIAGNOSIS — R41841 Cognitive communication deficit: Secondary | ICD-10-CM | POA: Diagnosis not present

## 2017-08-14 DIAGNOSIS — R1312 Dysphagia, oropharyngeal phase: Secondary | ICD-10-CM | POA: Diagnosis not present

## 2017-08-14 DIAGNOSIS — R278 Other lack of coordination: Secondary | ICD-10-CM | POA: Diagnosis not present

## 2017-08-15 DIAGNOSIS — R278 Other lack of coordination: Secondary | ICD-10-CM | POA: Diagnosis not present

## 2017-08-15 DIAGNOSIS — N390 Urinary tract infection, site not specified: Secondary | ICD-10-CM | POA: Diagnosis not present

## 2017-08-15 DIAGNOSIS — R1311 Dysphagia, oral phase: Secondary | ICD-10-CM | POA: Diagnosis not present

## 2017-08-15 DIAGNOSIS — R1312 Dysphagia, oropharyngeal phase: Secondary | ICD-10-CM | POA: Diagnosis not present

## 2017-08-15 DIAGNOSIS — R41841 Cognitive communication deficit: Secondary | ICD-10-CM | POA: Diagnosis not present

## 2017-08-15 DIAGNOSIS — M6281 Muscle weakness (generalized): Secondary | ICD-10-CM | POA: Diagnosis not present

## 2017-08-16 DIAGNOSIS — F331 Major depressive disorder, recurrent, moderate: Secondary | ICD-10-CM | POA: Diagnosis not present

## 2017-08-16 DIAGNOSIS — F064 Anxiety disorder due to known physiological condition: Secondary | ICD-10-CM | POA: Diagnosis not present

## 2017-08-16 DIAGNOSIS — N390 Urinary tract infection, site not specified: Secondary | ICD-10-CM | POA: Diagnosis not present

## 2017-08-16 DIAGNOSIS — R1312 Dysphagia, oropharyngeal phase: Secondary | ICD-10-CM | POA: Diagnosis not present

## 2017-08-16 DIAGNOSIS — M6281 Muscle weakness (generalized): Secondary | ICD-10-CM | POA: Diagnosis not present

## 2017-08-16 DIAGNOSIS — R41841 Cognitive communication deficit: Secondary | ICD-10-CM | POA: Diagnosis not present

## 2017-08-16 DIAGNOSIS — R278 Other lack of coordination: Secondary | ICD-10-CM | POA: Diagnosis not present

## 2017-08-16 DIAGNOSIS — R1311 Dysphagia, oral phase: Secondary | ICD-10-CM | POA: Diagnosis not present

## 2017-08-16 DIAGNOSIS — I739 Peripheral vascular disease, unspecified: Secondary | ICD-10-CM | POA: Diagnosis not present

## 2017-08-16 DIAGNOSIS — G301 Alzheimer's disease with late onset: Secondary | ICD-10-CM | POA: Diagnosis not present

## 2017-08-16 DIAGNOSIS — F0281 Dementia in other diseases classified elsewhere with behavioral disturbance: Secondary | ICD-10-CM | POA: Diagnosis not present

## 2017-08-17 DIAGNOSIS — R41841 Cognitive communication deficit: Secondary | ICD-10-CM | POA: Diagnosis not present

## 2017-08-17 DIAGNOSIS — R1311 Dysphagia, oral phase: Secondary | ICD-10-CM | POA: Diagnosis not present

## 2017-08-17 DIAGNOSIS — N390 Urinary tract infection, site not specified: Secondary | ICD-10-CM | POA: Diagnosis not present

## 2017-08-17 DIAGNOSIS — R1312 Dysphagia, oropharyngeal phase: Secondary | ICD-10-CM | POA: Diagnosis not present

## 2017-08-17 DIAGNOSIS — M6281 Muscle weakness (generalized): Secondary | ICD-10-CM | POA: Diagnosis not present

## 2017-08-17 DIAGNOSIS — R278 Other lack of coordination: Secondary | ICD-10-CM | POA: Diagnosis not present

## 2017-08-18 DIAGNOSIS — E119 Type 2 diabetes mellitus without complications: Secondary | ICD-10-CM | POA: Diagnosis not present

## 2017-08-18 DIAGNOSIS — R278 Other lack of coordination: Secondary | ICD-10-CM | POA: Diagnosis not present

## 2017-08-18 DIAGNOSIS — R1312 Dysphagia, oropharyngeal phase: Secondary | ICD-10-CM | POA: Diagnosis not present

## 2017-08-18 DIAGNOSIS — I10 Essential (primary) hypertension: Secondary | ICD-10-CM | POA: Diagnosis not present

## 2017-08-18 DIAGNOSIS — E1159 Type 2 diabetes mellitus with other circulatory complications: Secondary | ICD-10-CM | POA: Diagnosis not present

## 2017-08-18 DIAGNOSIS — R569 Unspecified convulsions: Secondary | ICD-10-CM | POA: Diagnosis not present

## 2017-08-18 DIAGNOSIS — I739 Peripheral vascular disease, unspecified: Secondary | ICD-10-CM | POA: Diagnosis not present

## 2017-08-18 DIAGNOSIS — R1311 Dysphagia, oral phase: Secondary | ICD-10-CM | POA: Diagnosis not present

## 2017-08-18 DIAGNOSIS — R05 Cough: Secondary | ICD-10-CM | POA: Diagnosis not present

## 2017-08-18 DIAGNOSIS — L603 Nail dystrophy: Secondary | ICD-10-CM | POA: Diagnosis not present

## 2017-08-18 DIAGNOSIS — E559 Vitamin D deficiency, unspecified: Secondary | ICD-10-CM | POA: Diagnosis not present

## 2017-08-18 DIAGNOSIS — M6281 Muscle weakness (generalized): Secondary | ICD-10-CM | POA: Diagnosis not present

## 2017-08-18 DIAGNOSIS — Q845 Enlarged and hypertrophic nails: Secondary | ICD-10-CM | POA: Diagnosis not present

## 2017-08-18 DIAGNOSIS — N390 Urinary tract infection, site not specified: Secondary | ICD-10-CM | POA: Diagnosis not present

## 2017-08-18 DIAGNOSIS — F0390 Unspecified dementia without behavioral disturbance: Secondary | ICD-10-CM | POA: Diagnosis not present

## 2017-08-18 DIAGNOSIS — R41841 Cognitive communication deficit: Secondary | ICD-10-CM | POA: Diagnosis not present

## 2017-08-18 DIAGNOSIS — B351 Tinea unguium: Secondary | ICD-10-CM | POA: Diagnosis not present

## 2017-08-21 DIAGNOSIS — R41841 Cognitive communication deficit: Secondary | ICD-10-CM | POA: Diagnosis not present

## 2017-08-21 DIAGNOSIS — R262 Difficulty in walking, not elsewhere classified: Secondary | ICD-10-CM | POA: Diagnosis not present

## 2017-08-21 DIAGNOSIS — R278 Other lack of coordination: Secondary | ICD-10-CM | POA: Diagnosis not present

## 2017-08-21 DIAGNOSIS — R1312 Dysphagia, oropharyngeal phase: Secondary | ICD-10-CM | POA: Diagnosis not present

## 2017-08-21 DIAGNOSIS — R1311 Dysphagia, oral phase: Secondary | ICD-10-CM | POA: Diagnosis not present

## 2017-08-21 DIAGNOSIS — E1165 Type 2 diabetes mellitus with hyperglycemia: Secondary | ICD-10-CM | POA: Diagnosis not present

## 2017-08-21 DIAGNOSIS — M6281 Muscle weakness (generalized): Secondary | ICD-10-CM | POA: Diagnosis not present

## 2017-08-21 DIAGNOSIS — N390 Urinary tract infection, site not specified: Secondary | ICD-10-CM | POA: Diagnosis not present

## 2017-08-22 DIAGNOSIS — N390 Urinary tract infection, site not specified: Secondary | ICD-10-CM | POA: Diagnosis not present

## 2017-08-22 DIAGNOSIS — R278 Other lack of coordination: Secondary | ICD-10-CM | POA: Diagnosis not present

## 2017-08-22 DIAGNOSIS — R41841 Cognitive communication deficit: Secondary | ICD-10-CM | POA: Diagnosis not present

## 2017-08-22 DIAGNOSIS — M6281 Muscle weakness (generalized): Secondary | ICD-10-CM | POA: Diagnosis not present

## 2017-08-22 DIAGNOSIS — R1311 Dysphagia, oral phase: Secondary | ICD-10-CM | POA: Diagnosis not present

## 2017-08-22 DIAGNOSIS — R1312 Dysphagia, oropharyngeal phase: Secondary | ICD-10-CM | POA: Diagnosis not present

## 2017-08-23 DIAGNOSIS — R278 Other lack of coordination: Secondary | ICD-10-CM | POA: Diagnosis not present

## 2017-08-23 DIAGNOSIS — R1312 Dysphagia, oropharyngeal phase: Secondary | ICD-10-CM | POA: Diagnosis not present

## 2017-08-23 DIAGNOSIS — R41841 Cognitive communication deficit: Secondary | ICD-10-CM | POA: Diagnosis not present

## 2017-08-23 DIAGNOSIS — M6281 Muscle weakness (generalized): Secondary | ICD-10-CM | POA: Diagnosis not present

## 2017-08-23 DIAGNOSIS — R1311 Dysphagia, oral phase: Secondary | ICD-10-CM | POA: Diagnosis not present

## 2017-08-23 DIAGNOSIS — N390 Urinary tract infection, site not specified: Secondary | ICD-10-CM | POA: Diagnosis not present

## 2017-08-24 DIAGNOSIS — R1312 Dysphagia, oropharyngeal phase: Secondary | ICD-10-CM | POA: Diagnosis not present

## 2017-08-24 DIAGNOSIS — R1311 Dysphagia, oral phase: Secondary | ICD-10-CM | POA: Diagnosis not present

## 2017-08-24 DIAGNOSIS — R41841 Cognitive communication deficit: Secondary | ICD-10-CM | POA: Diagnosis not present

## 2017-08-24 DIAGNOSIS — R278 Other lack of coordination: Secondary | ICD-10-CM | POA: Diagnosis not present

## 2017-08-24 DIAGNOSIS — M6281 Muscle weakness (generalized): Secondary | ICD-10-CM | POA: Diagnosis not present

## 2017-08-24 DIAGNOSIS — N390 Urinary tract infection, site not specified: Secondary | ICD-10-CM | POA: Diagnosis not present

## 2017-08-25 DIAGNOSIS — M6281 Muscle weakness (generalized): Secondary | ICD-10-CM | POA: Diagnosis not present

## 2017-08-25 DIAGNOSIS — R41841 Cognitive communication deficit: Secondary | ICD-10-CM | POA: Diagnosis not present

## 2017-08-25 DIAGNOSIS — R1312 Dysphagia, oropharyngeal phase: Secondary | ICD-10-CM | POA: Diagnosis not present

## 2017-08-25 DIAGNOSIS — R1311 Dysphagia, oral phase: Secondary | ICD-10-CM | POA: Diagnosis not present

## 2017-08-25 DIAGNOSIS — N390 Urinary tract infection, site not specified: Secondary | ICD-10-CM | POA: Diagnosis not present

## 2017-08-25 DIAGNOSIS — R278 Other lack of coordination: Secondary | ICD-10-CM | POA: Diagnosis not present

## 2017-08-28 DIAGNOSIS — R41841 Cognitive communication deficit: Secondary | ICD-10-CM | POA: Diagnosis not present

## 2017-08-28 DIAGNOSIS — M6281 Muscle weakness (generalized): Secondary | ICD-10-CM | POA: Diagnosis not present

## 2017-08-28 DIAGNOSIS — N390 Urinary tract infection, site not specified: Secondary | ICD-10-CM | POA: Diagnosis not present

## 2017-08-28 DIAGNOSIS — R278 Other lack of coordination: Secondary | ICD-10-CM | POA: Diagnosis not present

## 2017-08-28 DIAGNOSIS — R1311 Dysphagia, oral phase: Secondary | ICD-10-CM | POA: Diagnosis not present

## 2017-08-28 DIAGNOSIS — R1312 Dysphagia, oropharyngeal phase: Secondary | ICD-10-CM | POA: Diagnosis not present

## 2017-08-29 DIAGNOSIS — R1311 Dysphagia, oral phase: Secondary | ICD-10-CM | POA: Diagnosis not present

## 2017-08-29 DIAGNOSIS — R1312 Dysphagia, oropharyngeal phase: Secondary | ICD-10-CM | POA: Diagnosis not present

## 2017-08-29 DIAGNOSIS — N390 Urinary tract infection, site not specified: Secondary | ICD-10-CM | POA: Diagnosis not present

## 2017-08-29 DIAGNOSIS — R41841 Cognitive communication deficit: Secondary | ICD-10-CM | POA: Diagnosis not present

## 2017-08-29 DIAGNOSIS — M6281 Muscle weakness (generalized): Secondary | ICD-10-CM | POA: Diagnosis not present

## 2017-08-29 DIAGNOSIS — R278 Other lack of coordination: Secondary | ICD-10-CM | POA: Diagnosis not present

## 2017-08-30 DIAGNOSIS — M6281 Muscle weakness (generalized): Secondary | ICD-10-CM | POA: Diagnosis not present

## 2017-08-30 DIAGNOSIS — R1312 Dysphagia, oropharyngeal phase: Secondary | ICD-10-CM | POA: Diagnosis not present

## 2017-08-30 DIAGNOSIS — R278 Other lack of coordination: Secondary | ICD-10-CM | POA: Diagnosis not present

## 2017-08-30 DIAGNOSIS — R1311 Dysphagia, oral phase: Secondary | ICD-10-CM | POA: Diagnosis not present

## 2017-08-30 DIAGNOSIS — R41841 Cognitive communication deficit: Secondary | ICD-10-CM | POA: Diagnosis not present

## 2017-08-30 DIAGNOSIS — N390 Urinary tract infection, site not specified: Secondary | ICD-10-CM | POA: Diagnosis not present

## 2017-08-31 DIAGNOSIS — R1311 Dysphagia, oral phase: Secondary | ICD-10-CM | POA: Diagnosis not present

## 2017-08-31 DIAGNOSIS — N390 Urinary tract infection, site not specified: Secondary | ICD-10-CM | POA: Diagnosis not present

## 2017-08-31 DIAGNOSIS — R41841 Cognitive communication deficit: Secondary | ICD-10-CM | POA: Diagnosis not present

## 2017-08-31 DIAGNOSIS — R1312 Dysphagia, oropharyngeal phase: Secondary | ICD-10-CM | POA: Diagnosis not present

## 2017-08-31 DIAGNOSIS — M6281 Muscle weakness (generalized): Secondary | ICD-10-CM | POA: Diagnosis not present

## 2017-08-31 DIAGNOSIS — R278 Other lack of coordination: Secondary | ICD-10-CM | POA: Diagnosis not present

## 2017-09-01 DIAGNOSIS — M6281 Muscle weakness (generalized): Secondary | ICD-10-CM | POA: Diagnosis not present

## 2017-09-01 DIAGNOSIS — R1312 Dysphagia, oropharyngeal phase: Secondary | ICD-10-CM | POA: Diagnosis not present

## 2017-09-01 DIAGNOSIS — R41841 Cognitive communication deficit: Secondary | ICD-10-CM | POA: Diagnosis not present

## 2017-09-01 DIAGNOSIS — R278 Other lack of coordination: Secondary | ICD-10-CM | POA: Diagnosis not present

## 2017-09-01 DIAGNOSIS — R1311 Dysphagia, oral phase: Secondary | ICD-10-CM | POA: Diagnosis not present

## 2017-09-01 DIAGNOSIS — N390 Urinary tract infection, site not specified: Secondary | ICD-10-CM | POA: Diagnosis not present

## 2017-09-20 DIAGNOSIS — F0281 Dementia in other diseases classified elsewhere with behavioral disturbance: Secondary | ICD-10-CM | POA: Diagnosis not present

## 2017-09-20 DIAGNOSIS — G301 Alzheimer's disease with late onset: Secondary | ICD-10-CM | POA: Diagnosis not present

## 2017-09-20 DIAGNOSIS — F331 Major depressive disorder, recurrent, moderate: Secondary | ICD-10-CM | POA: Diagnosis not present

## 2017-09-20 DIAGNOSIS — F064 Anxiety disorder due to known physiological condition: Secondary | ICD-10-CM | POA: Diagnosis not present

## 2017-09-26 DIAGNOSIS — I10 Essential (primary) hypertension: Secondary | ICD-10-CM | POA: Diagnosis not present

## 2017-09-26 DIAGNOSIS — F0390 Unspecified dementia without behavioral disturbance: Secondary | ICD-10-CM | POA: Diagnosis not present

## 2017-09-26 DIAGNOSIS — E119 Type 2 diabetes mellitus without complications: Secondary | ICD-10-CM | POA: Diagnosis not present

## 2017-09-26 DIAGNOSIS — F329 Major depressive disorder, single episode, unspecified: Secondary | ICD-10-CM | POA: Diagnosis not present

## 2017-09-27 DIAGNOSIS — E785 Hyperlipidemia, unspecified: Secondary | ICD-10-CM | POA: Diagnosis not present

## 2017-09-27 DIAGNOSIS — E1169 Type 2 diabetes mellitus with other specified complication: Secondary | ICD-10-CM | POA: Diagnosis not present

## 2017-09-27 DIAGNOSIS — F322 Major depressive disorder, single episode, severe without psychotic features: Secondary | ICD-10-CM | POA: Diagnosis not present

## 2017-09-27 DIAGNOSIS — F0390 Unspecified dementia without behavioral disturbance: Secondary | ICD-10-CM | POA: Diagnosis not present

## 2017-09-27 DIAGNOSIS — F411 Generalized anxiety disorder: Secondary | ICD-10-CM | POA: Diagnosis not present

## 2017-09-27 DIAGNOSIS — E669 Obesity, unspecified: Secondary | ICD-10-CM | POA: Diagnosis not present

## 2017-09-27 DIAGNOSIS — I10 Essential (primary) hypertension: Secondary | ICD-10-CM | POA: Diagnosis not present

## 2017-10-18 DIAGNOSIS — F0281 Dementia in other diseases classified elsewhere with behavioral disturbance: Secondary | ICD-10-CM | POA: Diagnosis not present

## 2017-10-18 DIAGNOSIS — F064 Anxiety disorder due to known physiological condition: Secondary | ICD-10-CM | POA: Diagnosis not present

## 2017-10-18 DIAGNOSIS — G301 Alzheimer's disease with late onset: Secondary | ICD-10-CM | POA: Diagnosis not present

## 2017-10-18 DIAGNOSIS — F331 Major depressive disorder, recurrent, moderate: Secondary | ICD-10-CM | POA: Diagnosis not present

## 2017-10-26 DIAGNOSIS — D649 Anemia, unspecified: Secondary | ICD-10-CM | POA: Diagnosis not present

## 2017-10-26 DIAGNOSIS — Z79899 Other long term (current) drug therapy: Secondary | ICD-10-CM | POA: Diagnosis not present

## 2017-10-26 DIAGNOSIS — E000 Congenital iodine-deficiency syndrome, neurological type: Secondary | ICD-10-CM | POA: Diagnosis not present

## 2017-10-26 DIAGNOSIS — B351 Tinea unguium: Secondary | ICD-10-CM | POA: Diagnosis not present

## 2017-10-26 DIAGNOSIS — R319 Hematuria, unspecified: Secondary | ICD-10-CM | POA: Diagnosis not present

## 2017-10-26 DIAGNOSIS — R4182 Altered mental status, unspecified: Secondary | ICD-10-CM | POA: Diagnosis not present

## 2017-10-26 DIAGNOSIS — E1159 Type 2 diabetes mellitus with other circulatory complications: Secondary | ICD-10-CM | POA: Diagnosis not present

## 2017-10-26 DIAGNOSIS — K5900 Constipation, unspecified: Secondary | ICD-10-CM | POA: Diagnosis not present

## 2017-10-26 DIAGNOSIS — E039 Hypothyroidism, unspecified: Secondary | ICD-10-CM | POA: Diagnosis not present

## 2017-10-26 DIAGNOSIS — I10 Essential (primary) hypertension: Secondary | ICD-10-CM | POA: Diagnosis not present

## 2017-10-26 DIAGNOSIS — L603 Nail dystrophy: Secondary | ICD-10-CM | POA: Diagnosis not present

## 2017-10-26 DIAGNOSIS — I499 Cardiac arrhythmia, unspecified: Secondary | ICD-10-CM | POA: Diagnosis not present

## 2017-10-26 DIAGNOSIS — Q845 Enlarged and hypertrophic nails: Secondary | ICD-10-CM | POA: Diagnosis not present

## 2017-10-26 DIAGNOSIS — N390 Urinary tract infection, site not specified: Secondary | ICD-10-CM | POA: Diagnosis not present

## 2017-10-26 DIAGNOSIS — I739 Peripheral vascular disease, unspecified: Secondary | ICD-10-CM | POA: Diagnosis not present

## 2017-10-26 DIAGNOSIS — F0390 Unspecified dementia without behavioral disturbance: Secondary | ICD-10-CM | POA: Diagnosis not present

## 2017-10-26 DIAGNOSIS — E119 Type 2 diabetes mellitus without complications: Secondary | ICD-10-CM | POA: Diagnosis not present

## 2017-10-27 DIAGNOSIS — R1311 Dysphagia, oral phase: Secondary | ICD-10-CM | POA: Diagnosis not present

## 2017-10-27 DIAGNOSIS — R41841 Cognitive communication deficit: Secondary | ICD-10-CM | POA: Diagnosis not present

## 2017-10-27 DIAGNOSIS — R278 Other lack of coordination: Secondary | ICD-10-CM | POA: Diagnosis not present

## 2017-10-27 DIAGNOSIS — R262 Difficulty in walking, not elsewhere classified: Secondary | ICD-10-CM | POA: Diagnosis not present

## 2017-10-27 DIAGNOSIS — M6281 Muscle weakness (generalized): Secondary | ICD-10-CM | POA: Diagnosis not present

## 2017-10-27 DIAGNOSIS — N390 Urinary tract infection, site not specified: Secondary | ICD-10-CM | POA: Diagnosis not present

## 2017-10-27 DIAGNOSIS — R1312 Dysphagia, oropharyngeal phase: Secondary | ICD-10-CM | POA: Diagnosis not present

## 2017-10-27 DIAGNOSIS — E1165 Type 2 diabetes mellitus with hyperglycemia: Secondary | ICD-10-CM | POA: Diagnosis not present

## 2017-10-30 DIAGNOSIS — R1311 Dysphagia, oral phase: Secondary | ICD-10-CM | POA: Diagnosis not present

## 2017-10-30 DIAGNOSIS — R278 Other lack of coordination: Secondary | ICD-10-CM | POA: Diagnosis not present

## 2017-10-30 DIAGNOSIS — R1312 Dysphagia, oropharyngeal phase: Secondary | ICD-10-CM | POA: Diagnosis not present

## 2017-10-30 DIAGNOSIS — N390 Urinary tract infection, site not specified: Secondary | ICD-10-CM | POA: Diagnosis not present

## 2017-10-30 DIAGNOSIS — M6281 Muscle weakness (generalized): Secondary | ICD-10-CM | POA: Diagnosis not present

## 2017-10-30 DIAGNOSIS — R41841 Cognitive communication deficit: Secondary | ICD-10-CM | POA: Diagnosis not present

## 2017-10-31 DIAGNOSIS — R1312 Dysphagia, oropharyngeal phase: Secondary | ICD-10-CM | POA: Diagnosis not present

## 2017-10-31 DIAGNOSIS — R41841 Cognitive communication deficit: Secondary | ICD-10-CM | POA: Diagnosis not present

## 2017-10-31 DIAGNOSIS — N390 Urinary tract infection, site not specified: Secondary | ICD-10-CM | POA: Diagnosis not present

## 2017-10-31 DIAGNOSIS — R1311 Dysphagia, oral phase: Secondary | ICD-10-CM | POA: Diagnosis not present

## 2017-10-31 DIAGNOSIS — R278 Other lack of coordination: Secondary | ICD-10-CM | POA: Diagnosis not present

## 2017-10-31 DIAGNOSIS — M6281 Muscle weakness (generalized): Secondary | ICD-10-CM | POA: Diagnosis not present

## 2017-11-01 DIAGNOSIS — R278 Other lack of coordination: Secondary | ICD-10-CM | POA: Diagnosis not present

## 2017-11-01 DIAGNOSIS — R1312 Dysphagia, oropharyngeal phase: Secondary | ICD-10-CM | POA: Diagnosis not present

## 2017-11-01 DIAGNOSIS — R41841 Cognitive communication deficit: Secondary | ICD-10-CM | POA: Diagnosis not present

## 2017-11-01 DIAGNOSIS — R1311 Dysphagia, oral phase: Secondary | ICD-10-CM | POA: Diagnosis not present

## 2017-11-01 DIAGNOSIS — N390 Urinary tract infection, site not specified: Secondary | ICD-10-CM | POA: Diagnosis not present

## 2017-11-01 DIAGNOSIS — M6281 Muscle weakness (generalized): Secondary | ICD-10-CM | POA: Diagnosis not present

## 2017-11-02 DIAGNOSIS — M6281 Muscle weakness (generalized): Secondary | ICD-10-CM | POA: Diagnosis not present

## 2017-11-02 DIAGNOSIS — N390 Urinary tract infection, site not specified: Secondary | ICD-10-CM | POA: Diagnosis not present

## 2017-11-02 DIAGNOSIS — R1311 Dysphagia, oral phase: Secondary | ICD-10-CM | POA: Diagnosis not present

## 2017-11-02 DIAGNOSIS — R278 Other lack of coordination: Secondary | ICD-10-CM | POA: Diagnosis not present

## 2017-11-02 DIAGNOSIS — R41841 Cognitive communication deficit: Secondary | ICD-10-CM | POA: Diagnosis not present

## 2017-11-02 DIAGNOSIS — R1312 Dysphagia, oropharyngeal phase: Secondary | ICD-10-CM | POA: Diagnosis not present

## 2017-11-03 DIAGNOSIS — R1312 Dysphagia, oropharyngeal phase: Secondary | ICD-10-CM | POA: Diagnosis not present

## 2017-11-03 DIAGNOSIS — R278 Other lack of coordination: Secondary | ICD-10-CM | POA: Diagnosis not present

## 2017-11-03 DIAGNOSIS — R1311 Dysphagia, oral phase: Secondary | ICD-10-CM | POA: Diagnosis not present

## 2017-11-03 DIAGNOSIS — R41841 Cognitive communication deficit: Secondary | ICD-10-CM | POA: Diagnosis not present

## 2017-11-03 DIAGNOSIS — N390 Urinary tract infection, site not specified: Secondary | ICD-10-CM | POA: Diagnosis not present

## 2017-11-03 DIAGNOSIS — M6281 Muscle weakness (generalized): Secondary | ICD-10-CM | POA: Diagnosis not present

## 2017-11-06 DIAGNOSIS — R278 Other lack of coordination: Secondary | ICD-10-CM | POA: Diagnosis not present

## 2017-11-06 DIAGNOSIS — R1312 Dysphagia, oropharyngeal phase: Secondary | ICD-10-CM | POA: Diagnosis not present

## 2017-11-06 DIAGNOSIS — N390 Urinary tract infection, site not specified: Secondary | ICD-10-CM | POA: Diagnosis not present

## 2017-11-06 DIAGNOSIS — R41841 Cognitive communication deficit: Secondary | ICD-10-CM | POA: Diagnosis not present

## 2017-11-06 DIAGNOSIS — M6281 Muscle weakness (generalized): Secondary | ICD-10-CM | POA: Diagnosis not present

## 2017-11-06 DIAGNOSIS — R1311 Dysphagia, oral phase: Secondary | ICD-10-CM | POA: Diagnosis not present

## 2017-11-07 DIAGNOSIS — N390 Urinary tract infection, site not specified: Secondary | ICD-10-CM | POA: Diagnosis not present

## 2017-11-07 DIAGNOSIS — M6281 Muscle weakness (generalized): Secondary | ICD-10-CM | POA: Diagnosis not present

## 2017-11-07 DIAGNOSIS — R1311 Dysphagia, oral phase: Secondary | ICD-10-CM | POA: Diagnosis not present

## 2017-11-07 DIAGNOSIS — R278 Other lack of coordination: Secondary | ICD-10-CM | POA: Diagnosis not present

## 2017-11-07 DIAGNOSIS — R41841 Cognitive communication deficit: Secondary | ICD-10-CM | POA: Diagnosis not present

## 2017-11-07 DIAGNOSIS — R1312 Dysphagia, oropharyngeal phase: Secondary | ICD-10-CM | POA: Diagnosis not present

## 2017-11-08 DIAGNOSIS — M6281 Muscle weakness (generalized): Secondary | ICD-10-CM | POA: Diagnosis not present

## 2017-11-08 DIAGNOSIS — R278 Other lack of coordination: Secondary | ICD-10-CM | POA: Diagnosis not present

## 2017-11-08 DIAGNOSIS — R1311 Dysphagia, oral phase: Secondary | ICD-10-CM | POA: Diagnosis not present

## 2017-11-08 DIAGNOSIS — N390 Urinary tract infection, site not specified: Secondary | ICD-10-CM | POA: Diagnosis not present

## 2017-11-08 DIAGNOSIS — R41841 Cognitive communication deficit: Secondary | ICD-10-CM | POA: Diagnosis not present

## 2017-11-08 DIAGNOSIS — R1312 Dysphagia, oropharyngeal phase: Secondary | ICD-10-CM | POA: Diagnosis not present

## 2017-11-09 DIAGNOSIS — M6281 Muscle weakness (generalized): Secondary | ICD-10-CM | POA: Diagnosis not present

## 2017-11-09 DIAGNOSIS — R278 Other lack of coordination: Secondary | ICD-10-CM | POA: Diagnosis not present

## 2017-11-09 DIAGNOSIS — R1311 Dysphagia, oral phase: Secondary | ICD-10-CM | POA: Diagnosis not present

## 2017-11-09 DIAGNOSIS — R1312 Dysphagia, oropharyngeal phase: Secondary | ICD-10-CM | POA: Diagnosis not present

## 2017-11-09 DIAGNOSIS — R41841 Cognitive communication deficit: Secondary | ICD-10-CM | POA: Diagnosis not present

## 2017-11-09 DIAGNOSIS — N390 Urinary tract infection, site not specified: Secondary | ICD-10-CM | POA: Diagnosis not present

## 2017-11-10 DIAGNOSIS — R1311 Dysphagia, oral phase: Secondary | ICD-10-CM | POA: Diagnosis not present

## 2017-11-10 DIAGNOSIS — N390 Urinary tract infection, site not specified: Secondary | ICD-10-CM | POA: Diagnosis not present

## 2017-11-10 DIAGNOSIS — R1312 Dysphagia, oropharyngeal phase: Secondary | ICD-10-CM | POA: Diagnosis not present

## 2017-11-10 DIAGNOSIS — R278 Other lack of coordination: Secondary | ICD-10-CM | POA: Diagnosis not present

## 2017-11-10 DIAGNOSIS — M6281 Muscle weakness (generalized): Secondary | ICD-10-CM | POA: Diagnosis not present

## 2017-11-10 DIAGNOSIS — R41841 Cognitive communication deficit: Secondary | ICD-10-CM | POA: Diagnosis not present

## 2017-11-13 DIAGNOSIS — N390 Urinary tract infection, site not specified: Secondary | ICD-10-CM | POA: Diagnosis not present

## 2017-11-13 DIAGNOSIS — R1312 Dysphagia, oropharyngeal phase: Secondary | ICD-10-CM | POA: Diagnosis not present

## 2017-11-13 DIAGNOSIS — R1311 Dysphagia, oral phase: Secondary | ICD-10-CM | POA: Diagnosis not present

## 2017-11-13 DIAGNOSIS — M6281 Muscle weakness (generalized): Secondary | ICD-10-CM | POA: Diagnosis not present

## 2017-11-13 DIAGNOSIS — R278 Other lack of coordination: Secondary | ICD-10-CM | POA: Diagnosis not present

## 2017-11-13 DIAGNOSIS — R41841 Cognitive communication deficit: Secondary | ICD-10-CM | POA: Diagnosis not present

## 2017-11-22 DIAGNOSIS — F064 Anxiety disorder due to known physiological condition: Secondary | ICD-10-CM | POA: Diagnosis not present

## 2017-11-22 DIAGNOSIS — F0281 Dementia in other diseases classified elsewhere with behavioral disturbance: Secondary | ICD-10-CM | POA: Diagnosis not present

## 2017-11-22 DIAGNOSIS — F331 Major depressive disorder, recurrent, moderate: Secondary | ICD-10-CM | POA: Diagnosis not present

## 2017-11-22 DIAGNOSIS — G301 Alzheimer's disease with late onset: Secondary | ICD-10-CM | POA: Diagnosis not present

## 2017-11-29 DIAGNOSIS — E119 Type 2 diabetes mellitus without complications: Secondary | ICD-10-CM | POA: Diagnosis not present

## 2017-11-29 DIAGNOSIS — E785 Hyperlipidemia, unspecified: Secondary | ICD-10-CM | POA: Diagnosis not present

## 2017-11-29 DIAGNOSIS — F0390 Unspecified dementia without behavioral disturbance: Secondary | ICD-10-CM | POA: Diagnosis not present

## 2017-11-29 DIAGNOSIS — F411 Generalized anxiety disorder: Secondary | ICD-10-CM | POA: Diagnosis not present

## 2017-11-29 DIAGNOSIS — I10 Essential (primary) hypertension: Secondary | ICD-10-CM | POA: Diagnosis not present

## 2017-12-11 DIAGNOSIS — I10 Essential (primary) hypertension: Secondary | ICD-10-CM | POA: Diagnosis not present

## 2017-12-11 DIAGNOSIS — F0390 Unspecified dementia without behavioral disturbance: Secondary | ICD-10-CM | POA: Diagnosis not present

## 2017-12-11 DIAGNOSIS — D649 Anemia, unspecified: Secondary | ICD-10-CM | POA: Diagnosis not present

## 2017-12-11 DIAGNOSIS — Z79899 Other long term (current) drug therapy: Secondary | ICD-10-CM | POA: Diagnosis not present

## 2017-12-11 DIAGNOSIS — E119 Type 2 diabetes mellitus without complications: Secondary | ICD-10-CM | POA: Diagnosis not present

## 2017-12-11 DIAGNOSIS — R4182 Altered mental status, unspecified: Secondary | ICD-10-CM | POA: Diagnosis not present

## 2017-12-20 DIAGNOSIS — F331 Major depressive disorder, recurrent, moderate: Secondary | ICD-10-CM | POA: Diagnosis not present

## 2017-12-20 DIAGNOSIS — G301 Alzheimer's disease with late onset: Secondary | ICD-10-CM | POA: Diagnosis not present

## 2017-12-20 DIAGNOSIS — F064 Anxiety disorder due to known physiological condition: Secondary | ICD-10-CM | POA: Diagnosis not present

## 2017-12-20 DIAGNOSIS — F0281 Dementia in other diseases classified elsewhere with behavioral disturbance: Secondary | ICD-10-CM | POA: Diagnosis not present

## 2017-12-21 DIAGNOSIS — F331 Major depressive disorder, recurrent, moderate: Secondary | ICD-10-CM | POA: Diagnosis not present

## 2017-12-21 DIAGNOSIS — G301 Alzheimer's disease with late onset: Secondary | ICD-10-CM | POA: Diagnosis not present

## 2017-12-29 DIAGNOSIS — B351 Tinea unguium: Secondary | ICD-10-CM | POA: Diagnosis not present

## 2017-12-29 DIAGNOSIS — L603 Nail dystrophy: Secondary | ICD-10-CM | POA: Diagnosis not present

## 2017-12-29 DIAGNOSIS — Q845 Enlarged and hypertrophic nails: Secondary | ICD-10-CM | POA: Diagnosis not present

## 2017-12-29 DIAGNOSIS — L84 Corns and callosities: Secondary | ICD-10-CM | POA: Diagnosis not present

## 2017-12-29 DIAGNOSIS — E1159 Type 2 diabetes mellitus with other circulatory complications: Secondary | ICD-10-CM | POA: Diagnosis not present

## 2017-12-29 DIAGNOSIS — I739 Peripheral vascular disease, unspecified: Secondary | ICD-10-CM | POA: Diagnosis not present

## 2018-01-12 DIAGNOSIS — E119 Type 2 diabetes mellitus without complications: Secondary | ICD-10-CM | POA: Diagnosis not present

## 2018-01-12 DIAGNOSIS — F0390 Unspecified dementia without behavioral disturbance: Secondary | ICD-10-CM | POA: Diagnosis not present

## 2018-01-12 DIAGNOSIS — F329 Major depressive disorder, single episode, unspecified: Secondary | ICD-10-CM | POA: Diagnosis not present

## 2018-01-12 DIAGNOSIS — I10 Essential (primary) hypertension: Secondary | ICD-10-CM | POA: Diagnosis not present

## 2018-01-24 DIAGNOSIS — E785 Hyperlipidemia, unspecified: Secondary | ICD-10-CM | POA: Diagnosis not present

## 2018-01-24 DIAGNOSIS — F419 Anxiety disorder, unspecified: Secondary | ICD-10-CM | POA: Diagnosis not present

## 2018-01-24 DIAGNOSIS — F331 Major depressive disorder, recurrent, moderate: Secondary | ICD-10-CM | POA: Diagnosis not present

## 2018-01-24 DIAGNOSIS — E1165 Type 2 diabetes mellitus with hyperglycemia: Secondary | ICD-10-CM | POA: Diagnosis not present

## 2018-01-24 DIAGNOSIS — G301 Alzheimer's disease with late onset: Secondary | ICD-10-CM | POA: Diagnosis not present

## 2018-01-24 DIAGNOSIS — F064 Anxiety disorder due to known physiological condition: Secondary | ICD-10-CM | POA: Diagnosis not present

## 2018-01-24 DIAGNOSIS — I10 Essential (primary) hypertension: Secondary | ICD-10-CM | POA: Diagnosis not present

## 2018-01-24 DIAGNOSIS — F0281 Dementia in other diseases classified elsewhere with behavioral disturbance: Secondary | ICD-10-CM | POA: Diagnosis not present

## 2018-01-31 DIAGNOSIS — F064 Anxiety disorder due to known physiological condition: Secondary | ICD-10-CM | POA: Diagnosis not present

## 2018-01-31 DIAGNOSIS — G301 Alzheimer's disease with late onset: Secondary | ICD-10-CM | POA: Diagnosis not present

## 2018-01-31 DIAGNOSIS — F331 Major depressive disorder, recurrent, moderate: Secondary | ICD-10-CM | POA: Diagnosis not present

## 2018-01-31 DIAGNOSIS — F0281 Dementia in other diseases classified elsewhere with behavioral disturbance: Secondary | ICD-10-CM | POA: Diagnosis not present

## 2018-02-06 DIAGNOSIS — G2581 Restless legs syndrome: Secondary | ICD-10-CM | POA: Diagnosis not present

## 2018-02-06 DIAGNOSIS — E113293 Type 2 diabetes mellitus with mild nonproliferative diabetic retinopathy without macular edema, bilateral: Secondary | ICD-10-CM | POA: Diagnosis not present

## 2018-02-06 DIAGNOSIS — K5909 Other constipation: Secondary | ICD-10-CM | POA: Diagnosis not present

## 2018-02-06 DIAGNOSIS — Z87440 Personal history of urinary (tract) infections: Secondary | ICD-10-CM | POA: Diagnosis not present

## 2018-02-06 DIAGNOSIS — F0390 Unspecified dementia without behavioral disturbance: Secondary | ICD-10-CM | POA: Diagnosis not present

## 2018-02-06 DIAGNOSIS — I498 Other specified cardiac arrhythmias: Secondary | ICD-10-CM | POA: Diagnosis not present

## 2018-02-06 DIAGNOSIS — L89891 Pressure ulcer of other site, stage 1: Secondary | ICD-10-CM | POA: Diagnosis not present

## 2018-02-06 DIAGNOSIS — D6489 Other specified anemias: Secondary | ICD-10-CM | POA: Diagnosis not present

## 2018-02-06 DIAGNOSIS — I10 Essential (primary) hypertension: Secondary | ICD-10-CM | POA: Diagnosis not present

## 2018-02-06 DIAGNOSIS — F418 Other specified anxiety disorders: Secondary | ICD-10-CM | POA: Diagnosis not present

## 2018-02-06 DIAGNOSIS — R1319 Other dysphagia: Secondary | ICD-10-CM | POA: Diagnosis not present

## 2018-02-06 DIAGNOSIS — F3289 Other specified depressive episodes: Secondary | ICD-10-CM | POA: Diagnosis not present

## 2018-02-06 DIAGNOSIS — E119 Type 2 diabetes mellitus without complications: Secondary | ICD-10-CM | POA: Diagnosis not present

## 2018-02-12 DIAGNOSIS — R2689 Other abnormalities of gait and mobility: Secondary | ICD-10-CM | POA: Diagnosis not present

## 2018-02-12 DIAGNOSIS — E1151 Type 2 diabetes mellitus with diabetic peripheral angiopathy without gangrene: Secondary | ICD-10-CM | POA: Diagnosis not present

## 2018-02-12 DIAGNOSIS — Z1389 Encounter for screening for other disorder: Secondary | ICD-10-CM | POA: Diagnosis not present

## 2018-02-12 DIAGNOSIS — I7389 Other specified peripheral vascular diseases: Secondary | ICD-10-CM | POA: Diagnosis not present

## 2018-02-12 DIAGNOSIS — E7849 Other hyperlipidemia: Secondary | ICD-10-CM | POA: Diagnosis not present

## 2018-02-12 DIAGNOSIS — E559 Vitamin D deficiency, unspecified: Secondary | ICD-10-CM | POA: Diagnosis not present

## 2018-02-12 DIAGNOSIS — G2581 Restless legs syndrome: Secondary | ICD-10-CM | POA: Diagnosis not present

## 2018-02-12 DIAGNOSIS — D510 Vitamin B12 deficiency anemia due to intrinsic factor deficiency: Secondary | ICD-10-CM | POA: Diagnosis not present

## 2018-02-12 DIAGNOSIS — I10 Essential (primary) hypertension: Secondary | ICD-10-CM | POA: Diagnosis not present

## 2018-02-12 DIAGNOSIS — R112 Nausea with vomiting, unspecified: Secondary | ICD-10-CM | POA: Diagnosis not present

## 2018-03-07 ENCOUNTER — Inpatient Hospital Stay (HOSPITAL_COMMUNITY)
Admission: EM | Admit: 2018-03-07 | Discharge: 2018-03-09 | DRG: 563 | Disposition: A | Discharge status: Home / Self Care | Payer: Medicare Other | Attending: Orthopedic Surgery | Admitting: Orthopedic Surgery

## 2018-03-07 ENCOUNTER — Emergency Department (HOSPITAL_COMMUNITY): Payer: Medicare Other

## 2018-03-07 ENCOUNTER — Encounter (HOSPITAL_COMMUNITY): Payer: Self-pay

## 2018-03-07 ENCOUNTER — Other Ambulatory Visit: Payer: Self-pay

## 2018-03-07 DIAGNOSIS — M9711XA 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, initial encounter: Secondary | ICD-10-CM | POA: Diagnosis present

## 2018-03-07 DIAGNOSIS — Z8249 Family history of ischemic heart disease and other diseases of the circulatory system: Secondary | ICD-10-CM

## 2018-03-07 DIAGNOSIS — Z515 Encounter for palliative care: Secondary | ICD-10-CM | POA: Diagnosis present

## 2018-03-07 DIAGNOSIS — Z794 Long term (current) use of insulin: Secondary | ICD-10-CM

## 2018-03-07 DIAGNOSIS — Z96698 Presence of other orthopedic joint implants: Secondary | ICD-10-CM | POA: Diagnosis present

## 2018-03-07 DIAGNOSIS — E7800 Pure hypercholesterolemia, unspecified: Secondary | ICD-10-CM | POA: Diagnosis present

## 2018-03-07 DIAGNOSIS — K219 Gastro-esophageal reflux disease without esophagitis: Secondary | ICD-10-CM | POA: Diagnosis present

## 2018-03-07 DIAGNOSIS — G2581 Restless legs syndrome: Secondary | ICD-10-CM | POA: Diagnosis present

## 2018-03-07 DIAGNOSIS — Z7982 Long term (current) use of aspirin: Secondary | ICD-10-CM

## 2018-03-07 DIAGNOSIS — I10 Essential (primary) hypertension: Secondary | ICD-10-CM | POA: Diagnosis present

## 2018-03-07 DIAGNOSIS — Z79899 Other long term (current) drug therapy: Secondary | ICD-10-CM | POA: Diagnosis not present

## 2018-03-07 DIAGNOSIS — F329 Major depressive disorder, single episode, unspecified: Secondary | ICD-10-CM | POA: Diagnosis present

## 2018-03-07 DIAGNOSIS — M1990 Unspecified osteoarthritis, unspecified site: Secondary | ICD-10-CM | POA: Diagnosis present

## 2018-03-07 DIAGNOSIS — Z833 Family history of diabetes mellitus: Secondary | ICD-10-CM | POA: Diagnosis not present

## 2018-03-07 DIAGNOSIS — E119 Type 2 diabetes mellitus without complications: Secondary | ICD-10-CM | POA: Diagnosis present

## 2018-03-07 DIAGNOSIS — Z91013 Allergy to seafood: Secondary | ICD-10-CM | POA: Diagnosis not present

## 2018-03-07 DIAGNOSIS — S82143A Displaced bicondylar fracture of unspecified tibia, initial encounter for closed fracture: Secondary | ICD-10-CM | POA: Diagnosis not present

## 2018-03-07 DIAGNOSIS — F419 Anxiety disorder, unspecified: Secondary | ICD-10-CM | POA: Diagnosis present

## 2018-03-07 DIAGNOSIS — Z885 Allergy status to narcotic agent status: Secondary | ICD-10-CM | POA: Diagnosis not present

## 2018-03-07 DIAGNOSIS — W1789XA Other fall from one level to another, initial encounter: Secondary | ICD-10-CM | POA: Diagnosis present

## 2018-03-07 DIAGNOSIS — S82101A Unspecified fracture of upper end of right tibia, initial encounter for closed fracture: Secondary | ICD-10-CM | POA: Diagnosis not present

## 2018-03-07 DIAGNOSIS — Z882 Allergy status to sulfonamides status: Secondary | ICD-10-CM | POA: Diagnosis not present

## 2018-03-07 DIAGNOSIS — Z96651 Presence of right artificial knee joint: Secondary | ICD-10-CM | POA: Diagnosis present

## 2018-03-07 LAB — CBC WITH DIFFERENTIAL/PLATELET
ABS IMMATURE GRANULOCYTES: 0.03 10*3/uL (ref 0.00–0.07)
Basophils Absolute: 0 10*3/uL (ref 0.0–0.1)
Basophils Relative: 0 %
EOS PCT: 1 %
Eosinophils Absolute: 0.1 10*3/uL (ref 0.0–0.5)
HEMATOCRIT: 35.3 % — AB (ref 36.0–46.0)
HEMOGLOBIN: 10.8 g/dL — AB (ref 12.0–15.0)
Immature Granulocytes: 0 %
LYMPHS ABS: 1.5 10*3/uL (ref 0.7–4.0)
LYMPHS PCT: 19 %
MCH: 26.7 pg (ref 26.0–34.0)
MCHC: 30.6 g/dL (ref 30.0–36.0)
MCV: 87.4 fL (ref 80.0–100.0)
Monocytes Absolute: 0.6 10*3/uL (ref 0.1–1.0)
Monocytes Relative: 7 %
NEUTROS ABS: 5.7 10*3/uL (ref 1.7–7.7)
Neutrophils Relative %: 73 %
Platelets: 213 10*3/uL (ref 150–400)
RBC: 4.04 MIL/uL (ref 3.87–5.11)
RDW: 15.1 % (ref 11.5–15.5)
WBC: 8 10*3/uL (ref 4.0–10.5)
nRBC: 0 % (ref 0.0–0.2)

## 2018-03-07 LAB — BASIC METABOLIC PANEL
Anion gap: 9 (ref 5–15)
BUN: 41 mg/dL — ABNORMAL HIGH (ref 8–23)
CHLORIDE: 106 mmol/L (ref 98–111)
CO2: 26 mmol/L (ref 22–32)
Calcium: 9 mg/dL (ref 8.9–10.3)
Creatinine, Ser: 1.3 mg/dL — ABNORMAL HIGH (ref 0.44–1.00)
GFR calc non Af Amer: 39 mL/min — ABNORMAL LOW (ref >60)
GFR, EST AFRICAN AMERICAN: 45 mL/min — AB (ref >60)
Glucose, Bld: 195 mg/dL — ABNORMAL HIGH (ref 70–99)
POTASSIUM: 4.4 mmol/L (ref 3.5–5.1)
SODIUM: 141 mmol/L (ref 135–145)

## 2018-03-07 LAB — CBG MONITORING, ED: Glucose-Capillary: 190 mg/dL — ABNORMAL HIGH (ref 70–99)

## 2018-03-07 MED ORDER — ASPIRIN 325 MG PO TABS
325.0000 mg | ORAL_TABLET | Freq: Every day | ORAL | Status: DC
  Administered 2018-03-08 – 2018-03-09 (×2): 325 mg via ORAL
  Filled 2018-03-07 (×2): qty 1

## 2018-03-07 MED ORDER — LOSARTAN POTASSIUM 50 MG PO TABS
100.0000 mg | ORAL_TABLET | Freq: Every day | ORAL | Status: DC
  Administered 2018-03-07 – 2018-03-09 (×3): 100 mg via ORAL
  Filled 2018-03-07 (×3): qty 2

## 2018-03-07 MED ORDER — METOPROLOL SUCCINATE ER 50 MG PO TB24
50.0000 mg | ORAL_TABLET | Freq: Every day | ORAL | Status: DC
  Administered 2018-03-07 – 2018-03-09 (×3): 50 mg via ORAL
  Filled 2018-03-07 (×3): qty 1

## 2018-03-07 MED ORDER — HYDROCODONE-ACETAMINOPHEN 5-325 MG PO TABS
1.0000 | ORAL_TABLET | ORAL | Status: DC | PRN
  Administered 2018-03-07 – 2018-03-09 (×8): 1 via ORAL
  Filled 2018-03-07 (×7): qty 1
  Filled 2018-03-07: qty 2

## 2018-03-07 MED ORDER — ONDANSETRON HCL 4 MG PO TABS: 4.0000 mg | ORAL_TABLET | Freq: Four times a day (QID) | ORAL | Status: DC | PRN

## 2018-03-07 MED ORDER — AMLODIPINE BESYLATE 5 MG PO TABS
5.0000 mg | ORAL_TABLET | Freq: Every day | ORAL | Status: DC
  Administered 2018-03-07 – 2018-03-09 (×3): 5 mg via ORAL
  Filled 2018-03-07 (×3): qty 1

## 2018-03-07 MED ORDER — PRAMIPEXOLE DIHYDROCHLORIDE 1 MG PO TABS
1.5000 mg | ORAL_TABLET | Freq: Three times a day (TID) | ORAL | Status: DC
  Administered 2018-03-07 – 2018-03-09 (×6): 1.5 mg via ORAL
  Filled 2018-03-07 (×2): qty 2
  Filled 2018-03-07: qty 6
  Filled 2018-03-07: qty 2
  Filled 2018-03-07 (×2): qty 6
  Filled 2018-03-07 (×2): qty 2

## 2018-03-07 MED ORDER — INSULIN DETEMIR 100 UNIT/ML ~~LOC~~ SOLN
18.0000 [IU] | Freq: Two times a day (BID) | SUBCUTANEOUS | Status: DC
  Administered 2018-03-07 – 2018-03-09 (×5): 18 [IU] via SUBCUTANEOUS
  Filled 2018-03-07 (×6): qty 0.18

## 2018-03-07 MED ORDER — ALPRAZOLAM 0.25 MG PO TABS: 0.2500 mg | ORAL_TABLET | Freq: Every evening | ORAL | Status: DC | PRN

## 2018-03-07 MED ORDER — ACETAMINOPHEN 500 MG PO TABS: 500.0000 mg | ORAL_TABLET | Freq: Three times a day (TID) | ORAL | Status: DC | PRN

## 2018-03-07 MED ORDER — PANTOPRAZOLE SODIUM 40 MG PO TBEC
40.0000 mg | DELAYED_RELEASE_TABLET | Freq: Every day | ORAL | Status: DC
  Administered 2018-03-07 – 2018-03-09 (×3): 40 mg via ORAL
  Filled 2018-03-07 (×3): qty 1

## 2018-03-07 MED ORDER — INSULIN ASPART 100 UNIT/ML ~~LOC~~ SOLN: 2.0000 [IU] | Freq: Three times a day (TID) | SUBCUTANEOUS | Status: DC

## 2018-03-07 MED ORDER — MIRTAZAPINE 15 MG PO TABS
15.0000 mg | ORAL_TABLET | Freq: Every day | ORAL | Status: DC
  Administered 2018-03-07 – 2018-03-08 (×2): 15 mg via ORAL
  Filled 2018-03-07 (×2): qty 1

## 2018-03-07 MED ORDER — HYDROCODONE-ACETAMINOPHEN 5-325 MG PO TABS
1.0000 | ORAL_TABLET | Freq: Once | ORAL | Status: AC
  Administered 2018-03-07: 1 via ORAL
  Filled 2018-03-07: qty 1

## 2018-03-07 MED ORDER — INSULIN ASPART 100 UNIT/ML ~~LOC~~ SOLN
0.0000 [IU] | Freq: Three times a day (TID) | SUBCUTANEOUS | Status: DC
  Administered 2018-03-07 – 2018-03-08 (×2): 3 [IU] via SUBCUTANEOUS
  Administered 2018-03-08 – 2018-03-09 (×3): 2 [IU] via SUBCUTANEOUS

## 2018-03-07 MED ORDER — ASPIRIN 325 MG PO TABS: 325.0000 mg | ORAL_TABLET | Freq: Every day | ORAL | Status: DC

## 2018-03-07 MED ORDER — DIVALPROEX SODIUM 125 MG PO CSDR
125.0000 mg | DELAYED_RELEASE_CAPSULE | Freq: Every day | ORAL | Status: DC
  Administered 2018-03-07 – 2018-03-09 (×3): 125 mg via ORAL
  Filled 2018-03-07 (×3): qty 1

## 2018-03-07 NOTE — ED Provider Notes (Signed)
[facility] DEPT Provider Note   CSN: [id number] Arrival date & time: 03/07/18  0859     History   Chief Complaint Chief Complaint  Patient presents with  . Fall  . Knee Pain    HPI [name] is a 78 y.o. female.  78 year old female presents after mechanical fall yesterday.  Patient is nonambulatory normally and was likely dropped when being transferred according to her daughter.  Complains of pain to her right knee and right ankle.  No loss of consciousness with the event yesterday.  Was seen by EMS and according to her caregiver was cleared.  Continue to have pain all night which was treated with Tylenol.  Denies any right hip pain.  Does have a history of right knee replacement.  No head neck chest abdominal or back trauma.  Symptoms characterizes sharp and worse with any movement better with remaining still.     Past Medical History:  Diagnosis Date  . Anxiety   . Depression   . Diabetes mellitus   . Dyspnea   . GERD (gastroesophageal reflux disease)   . Headache(784.0)   . HTN (hypertension)    dr Johnsie Cancel  . Hypercholesteremia    DENIES  . Osteoarthritis   . PONV (postoperative nausea and vomiting)   . RLS (restless legs syndrome)   . Stress     Patient Active Problem List   Diagnosis Date Noted  . Hypertensive urgency 09/11/2016  . Dental abscess 09/11/2016  . Hypokalemia 09/11/2016  . 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 Acute post-hemorrhagic anemia 07/09/2016  . Symptomatic anemia 07/09/2016  . 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 Abdominal pain, chronic, epigastric 08/10/2015  . Dehydration   . Leukocytosis   . Aspiration pneumonia ([place]) 07/21/2014  . DM type 2 (diabetes mellitus, type 2) ([place]) 07/21/2014  . Palliative care encounter 07/21/2014  . Dysphagia 07/09/2014  . Decubitus ulcer 07/09/2014  . Blood poisoning   . Acute respiratory failure with hypoxia ([place])  06/30/2014  . Septic shock ([place]) 06/30/2014  . Lactic acidosis 06/30/2014  . Dementia ([place]) 06/30/2014  . Acute respiratory failure with hypoxemia ([place])   . Hypernatremia   . Acute kidney injury ([place]) 05/28/2014  . Fever 05/27/2014  . Lower urinary tract infectious disease 05/27/2014  . Sepsis ([place]) 05/27/2014  . Acute encephalopathy   . FTT (failure to thrive) in adult   . Gait instability 07/18/2013    Class: Acute  . Low back pain 07/18/2013    Class: Acute  . Nausea with vomiting 07/18/2013    Class: Acute  . Abnormality of gait 04/29/2013  . 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 Head trauma 06/03/2010  . HYPERCHOLESTEROLEMIA 05/15/2008  . ANXIETY DEPRESSION 05/15/2008  . DEPRESSION 05/15/2008  . Essential hypertension 05/15/2008  . OSTEOARTHRITIS 05/15/2008  . Edema 05/15/2008  . DYSPNEA ON EXERTION 05/15/2008  . ELECTROCARDIOGRAM, ABNORMAL 05/15/2008    Past Surgical History:  Procedure Laterality Date  . ABDOMINAL HYSTERECTOMY    . COLONOSCOPY    . JOINT REPLACEMENT    . left forearm fracture with ORIF Left   . LUMBAR LAMINECTOMY/DECOMPRESSION MICRODISCECTOMY N/A 04/18/2012   Procedure: LUMBAR LAMINECTOMY/DECOMPRESSION MICRODISCECTOMY 3 LEVELS;  Surgeon: Winfield Cunas, MD;  Location: [facility];  Service: Neurosurgery;  Laterality: N/A;  Lumbar three-four, lumbar four-five, lumbar five-sacral one decompression. Synovial cyst resection lumbar four-five  . ORIF PERIPROSTHETIC FRACTURE Right 07/06/2016   Procedure: OPEN REDUCTION  INTERNAL FIXATION (ORIF) PERIPROSTHETIC FRACTURE;  Surgeon: Gaynelle Arabian, MD;  Location: [facility];  Service: Orthopedics;  Laterality: Right;  . REPLACEMENT TOTAL KNEE Right   . WISDOM TOOTH EXTRACTION       OB History   No obstetric history on file.      Home Medications    Prior to Admission medications   Medication Sig Start Date End Date Taking? Authorizing Provider  acetaminophen (TYLENOL)  500 MG tablet Take 500 mg by mouth every 8 (eight) hours as needed for mild pain.    [provider]  ALPRAZolam Duanne Moron) 0.25 MG tablet Take 1 tablet (0.25 mg total) by mouth at bedtime as needed for anxiety. 09/12/16   Dessa Phi, DO  amLODipine (NORVASC) 5 MG tablet Take 1 tablet (5 mg total) by mouth daily. 09/12/16   Dessa Phi, DO  aspirin 81 MG chewable tablet Chew 1 tablet (81 mg total) by mouth daily. 09/12/16   Dessa Phi, DO  bisacodyl (DULCOLAX) 10 MG suppository Place 1 suppository (10 mg total) rectally daily as needed for moderate constipation. 07/08/16   Perkins, Alexzandrew L, PA-C  docusate sodium (COLACE) 100 MG capsule Take 100 mg by mouth 2 (two) times daily.    [provider]  DULoxetine (CYMBALTA) 30 MG capsule Take 30 mg by mouth 2 (two) times daily.    [provider]  insulin aspart (NOVOLOG) 100 UNIT/ML injection Inject 2-11 Units into the skin 3 (three) times daily before meals. Pt uses per sliding scale:    101-150:  2 units 151-200:  3 units  201-250:  5 units  251-300:  7 units  301-350:  9 units  Greater than 350:  11 units Greater than 400 or less than 60:  Call MD    [provider]  Insulin Detemir (LEVEMIR FLEXTOUCH) 100 UNIT/ML Pen Inject 8-12 Units into the skin 2 (two) times daily. Pt uses 12 units in the morning and 8 units at bedtime.    [provider]  loratadine (CLARITIN) 10 MG tablet Take 10 mg by mouth daily.     [provider]  losartan (COZAAR) 100 MG tablet Take 100 mg by mouth every evening.     [provider]  Magnesium Oxide 500 MG TABS Take 500 mg by mouth daily.     [provider]  methocarbamol (ROBAXIN) 500 MG tablet Take 1 tablet (500 mg total) by mouth every 6 (six) hours as needed for muscle spasms. 07/08/16   Perkins, Alexzandrew L, PA-C  metoprolol succinate (TOPROL-XL) 50 MG 24 hr tablet Take 1 tablet (50 mg total) by mouth daily. 07/04/14   Robbie Lis, MD  Nutritional Supplements (NUTRITIONAL SUPPLEMENT PLUS) LIQD Take 120 mLs by mouth 2 (two) times daily. Med [facility]    [provider]  omeprazole (PRILOSEC) 40 MG capsule Take 40 mg by mouth daily.    [provider]  pramipexole (MIRAPEX) 0.25 MG tablet Take 1.5 mg by mouth 3 (three) times daily.     [provider]    Family History Family History  Problem Relation Age of Onset  . Diabetes Father   . High blood pressure Father   . High blood pressure Mother   . Colon cancer Neg Hx   . Esophageal cancer Neg Hx   . Liver disease Neg Hx   . Stomach cancer Neg Hx   . Pancreatic cancer Neg Hx     Social History Social History   Tobacco  Use  . Smoking status: Never Smoker  . Smokeless tobacco: Never Used  Substance Use Topics  . Alcohol use: No    Alcohol/week: 0.0 standard drinks  . Drug use: No     Allergies   Morphine and related; Oxycodone; and Sulfonamide derivatives   Review of Systems Review of Systems  All other systems reviewed and are negative.    Physical Exam Updated Vital Signs BP 126/66   Pulse (!) 59   Temp 98.3 F (36.8 C) (Oral)   Resp 16   Ht 1.702 m (5\' 7" )   Wt 95.3 kg   SpO2 98%   BMI 32.89 kg/m   Physical Exam Vitals signs and nursing note reviewed.  Constitutional:      General: She is not in acute distress.    Appearance: Normal appearance. She is well-developed. She is not toxic-appearing.  HENT:     Head: Normocephalic and atraumatic.  Eyes:     General: Lids are normal.     Conjunctiva/sclera: Conjunctivae normal.     Pupils: Pupils are equal, round, and reactive to light.  Neck:     Musculoskeletal: Normal range of motion and neck supple.     Thyroid: No thyroid mass.     Trachea: No tracheal deviation.  Cardiovascular:     Rate and Rhythm: Normal rate and regular rhythm.     Heart sounds: Normal heart sounds. No murmur. No gallop.   Pulmonary:     Effort: Pulmonary effort is normal. No  respiratory distress.     Breath sounds: Normal breath sounds. No stridor. No decreased breath sounds, wheezing, rhonchi or rales.  Abdominal:     General: Bowel sounds are normal. There is no distension.     Palpations: Abdomen is soft.     Tenderness: There is no abdominal tenderness. There is no rebound.  Musculoskeletal: Normal range of motion.        General: No tenderness.     Right ankle: She exhibits swelling.       Legs:       Feet:  Skin:    General: Skin is warm and dry.     Findings: No abrasion or rash.  Neurological:     Mental Status: She is alert and oriented to person, place, and time.     GCS: GCS eye subscore is 4. GCS verbal subscore is 5. GCS motor subscore is 6.     Cranial Nerves: No cranial nerve deficit.     Sensory: No sensory deficit.  Psychiatric:        Speech: Speech normal.        Behavior: Behavior normal.      ED Treatments / Results  Labs (all labs ordered are listed, but only abnormal results are displayed) Labs Reviewed  CBG MONITORING, ED - Abnormal; Notable for the following components:      Result Value   Glucose-Capillary 190 (*)    All other components within normal limits    EKG None  Radiology No results found.  Procedures Procedures (including critical care time)  Medications Ordered in ED Medications - No data to display   Initial Impression / Assessment and Plan / ED Course  I have reviewed the triage vital signs and the nursing notes.  Pertinent labs & imaging results that were available during my care of the patient were reviewed by me and considered in my medical decision making (see chart for details).     X-ray of patient's right  knee consistent with fracture at her prior knee replacement hardware.  Discussed case with Dr. Wynelle Link who will admit the patient.  Patient placed in a knee immobilizer for comfort  Final Clinical Impressions(s) / ED Diagnoses   Final diagnoses:  None    ED Discharge Orders      None       Lacretia Leigh, MD 03/07/18 1257

## 2018-03-07 NOTE — ED Triage Notes (Signed)
EMS reports from home, fall yesterday onto both knee, prior right knee replacement. C/o pain to right knee.  BP 105/72 HR 64 RR 16 Sp02 94 RA CBG 300  81mcg Fentanyl  22ga RAC

## 2018-03-07 NOTE — H&P (Signed)
Patient ID: CHARRON COULTAS MRN: [id number] DOB/AGE: [birth year] 80 y.o.  Admit date: 03/07/2018  Chief Complaint: Right knee pain  Subjective: Ms. Hallgren is a 78 y/o female with PMH significant for h/o right TKA who presented to [facility] Emergency Department on 03/07/2018 for right knee pain following a mechanical fall yesterday. Patient is accompanied by her daughter, who provided the history. Patient was at home yesterday around 4pm and being transferred from a wheelchair to the bed by a nursing assistant. When the daughter entered the room, Ms. Critzer was on floor sitting up with her feet tucked underneath her. She immediately complained of right knee and ankle pain. Denied hitting head or LOC. EMS was called and the patient was cleared. The daughter states pain continued to have pain throughout the night despite use of Tylenol. EMS was called again and patient was brought to the emergency department. X-rays of the right knee showed prosthesis in good position with small fractures involving both the tibia and the fibula. Today patient is resting comfortably in bed, reports pain is controlled. Daughter reports patient is non-ambulatory at baseline and uses walker for mobilization. Denies use of anticoagulants.  Allergies: Allergies  Allergen Reactions  . Flounder [Fish Allergy] Other (See Comments)    Pt breaks out in bumps  . Morphine And Related Other (See Comments)    Reaction:  Hallucinations   . Oxycodone Other (See Comments)    Reaction:  Hallucinations  . Sulfonamide Derivatives Swelling and Other (See Comments)    Reaction:  Unspecified swelling reaction      Medications: (Not in a hospital admission)   Past Medical History: Past Medical History:  Diagnosis Date  . Anxiety   . Depression   . Diabetes mellitus   . Dyspnea   . GERD (gastroesophageal reflux disease)   . Headache(784.0)   . HTN (hypertension)    dr Johnsie Cancel  . Hypercholesteremia    DENIES    . Osteoarthritis   . PONV (postoperative nausea and vomiting)   . RLS (restless legs syndrome)   . Stress      Past Surgical History: Past Surgical History:  Procedure Laterality Date  . ABDOMINAL HYSTERECTOMY    . COLONOSCOPY    . JOINT REPLACEMENT    . left forearm fracture with ORIF Left   . LUMBAR LAMINECTOMY/DECOMPRESSION MICRODISCECTOMY N/A 04/18/2012   Procedure: LUMBAR LAMINECTOMY/DECOMPRESSION MICRODISCECTOMY 3 LEVELS;  Surgeon: Winfield Cunas, MD;  Location: [facility];  Service: Neurosurgery;  Laterality: N/A;  Lumbar three-four, lumbar four-five, lumbar five-sacral one decompression. Synovial cyst resection lumbar four-five  . ORIF PERIPROSTHETIC FRACTURE Right 07/06/2016   Procedure: OPEN REDUCTION INTERNAL FIXATION (ORIF) PERIPROSTHETIC FRACTURE;  Surgeon: Gaynelle Arabian, MD;  Location: [facility];  Service: Orthopedics;  Laterality: Right;  . REPLACEMENT TOTAL KNEE Right   . WISDOM TOOTH EXTRACTION       Family History: Family History  Problem Relation Age of Onset  . Diabetes Father   . High blood pressure Father   . High blood pressure Mother   . Colon cancer Neg Hx   . Esophageal cancer Neg Hx   . Liver disease Neg Hx   . Stomach cancer Neg Hx   . Pancreatic cancer Neg Hx     Social History: Social History   Tobacco Use  . Smoking status: Never Smoker  . Smokeless tobacco: Never Used  Substance Use Topics  . Alcohol use: No    Alcohol/week: 0.0 standard drinks  Review of Systems Pertinent items are noted in HPI.  Physical Exam: General: alert and oriented HENT:Head: Normocephalic, no lesions, without obvious abnormality. Neck:supple Chest: clear to auscultation, no wheezes, rales or rhonchi, symmetric air entry Heart: S1, S2 normal, no murmur, rub or gallop, regular rate and rhythm Musculoskeletal: Right lower extremity in knee immobilizer. No skin breaks or obvious deformities to the knee. No bruising or erythema. Mild swelling. Calf soft and  nontender. Distal pulses 2+. Dorsiflexion and plantar flexion intact. Distal sensation intact.  LABS: Results for orders placed or performed during the hospital encounter of 03/07/18  CBC with Differential/Platelet  Result Value Ref Range   WBC 8.0 4.0 - 10.5 K/uL   RBC 4.04 3.87 - 5.11 MIL/uL   Hemoglobin 10.8 (L) 12.0 - 15.0 g/dL   HCT 35.3 (L) 36.0 - 46.0 %   MCV 87.4 80.0 - 100.0 fL   MCH 26.7 26.0 - 34.0 pg   MCHC 30.6 30.0 - 36.0 g/dL   RDW 15.1 11.5 - 15.5 %   Platelets 213 150 - 400 K/uL   nRBC 0.0 0.0 - 0.2 %   Neutrophils Relative % 73 %   Neutro Abs 5.7 1.7 - 7.7 K/uL   Lymphocytes Relative 19 %   Lymphs Abs 1.5 0.7 - 4.0 K/uL   Monocytes Relative 7 %   Monocytes Absolute 0.6 0.1 - 1.0 K/uL   Eosinophils Relative 1 %   Eosinophils Absolute 0.1 0.0 - 0.5 K/uL   Basophils Relative 0 %   Basophils Absolute 0.0 0.0 - 0.1 K/uL   Immature Granulocytes 0 %   Abs Immature Granulocytes 0.03 0.00 - 0.07 K/uL  Basic metabolic panel  Result Value Ref Range   Sodium 141 135 - 145 mmol/L   Potassium 4.4 3.5 - 5.1 mmol/L   Chloride 106 98 - 111 mmol/L   CO2 26 22 - 32 mmol/L   Glucose, Bld 195 (H) 70 - 99 mg/dL   BUN 41 (H) 8 - 23 mg/dL   Creatinine, Ser 1.30 (H) 0.44 - 1.00 mg/dL   Calcium 9.0 8.9 - 10.3 mg/dL   GFR calc non Af Amer 39 (L) >60 mL/min   GFR calc Af Amer 45 (L) >60 mL/min   Anion gap 9 5 - 15  CBG monitoring, ED  Result Value Ref Range   Glucose-Capillary 190 (H) 70 - 99 mg/dL   Recent Labs    03/07/18 1206  HGB 10.8*   Recent Labs    03/07/18 1206  WBC 8.0  RBC 4.04  HCT 35.3*  PLT 213   Recent Labs    03/07/18 1206  NA 141  K 4.4  CL 106  CO2 26  BUN 41*  CREATININE 1.30*  GLUCOSE 195*  CALCIUM 9.0   Assessment/Plan: Right tibia/fibula periprosthetic fracture   The patient is being admitted to [facility] for pain management and disposition planning. Right knee to stay in knee immobilizer. Patient will be non-weight  bearing to the RLE. Oral pain medication ordered. Will most likely need SNF upon discharge from the hospital. ASA 325 mg QD ordered for DVT prophylaxis.  Theresa Duty, PA-C Orthopedic Surgery [facility]

## 2018-03-07 NOTE — Progress Notes (Signed)
Patient's daughter reported patient has a deep tissue injury on right foot  On posterior side parallel to 5th metatarsal. Daughter reports mother has had this for some time.

## 2018-03-07 NOTE — ED Notes (Signed)
Bed: WA09 Expected date:  Expected time:  Means of arrival:  Comments: EMS fall knee pain

## 2018-03-07 NOTE — Plan of Care (Signed)

## 2018-03-08 LAB — GLUCOSE, CAPILLARY
GLUCOSE-CAPILLARY: 214 mg/dL — AB (ref 70–99)
Glucose-Capillary: 269 mg/dL — ABNORMAL HIGH (ref 70–99)
Glucose-Capillary: 170 mg/dL — ABNORMAL HIGH (ref 70–99)
Glucose-Capillary: 231 mg/dL — ABNORMAL HIGH (ref 70–99)
Glucose-Capillary: 178 mg/dL — ABNORMAL HIGH (ref 70–99)
Glucose-Capillary: 151 mg/dL — ABNORMAL HIGH (ref 70–99)

## 2018-03-08 NOTE — Progress Notes (Signed)
   Subjective:   Patient reports pain as moderate.   Patient seen in rounds with Dr. Wynelle Link. Patient is well and has no complaints other than pain in the right knee, exacerbated with attempted movement. No issues overnight. Tolerating pain medications well.  Objective: Vital signs in last 24 hours: Temp:  [97.9 F (36.6 C)-98.8 F (37.1 C)] 97.9 F (36.6 C) (01/16 0539) Pulse Rate:  [40-108] 108 (01/16 0539) Resp:  [14-24] 14 (01/16 0539) BP: (120-187)/(58-111) 140/69 (01/16 0539) SpO2:  [94 %-100 %] 94 % (01/16 0539) Weight:  [95.3 kg] 95.3 kg (01/15 0911)  Intake/Output from previous day:  Intake/Output Summary (Last 24 hours) at 03/08/2018 0717 Last data filed at 03/08/2018 0139 Gross per 24 hour  Intake 660 ml  Output 250 ml  Net 410 ml    Labs: Recent Labs    03/07/18 1206  HGB 10.8*   Recent Labs    03/07/18 1206  WBC 8.0  RBC 4.04  HCT 35.3*  PLT 213   Recent Labs    03/07/18 1206  NA 141  K 4.4  CL 106  CO2 26  BUN 41*  CREATININE 1.30*  GLUCOSE 195*  CALCIUM 9.0   Exam: General - Patient is Alert and Oriented Extremity - Neurologically intact Neurovascular intact Sensation intact distally Dorsiflexion/Plantar flexion intact  Mild tissue swelling about the right knee Motor Function - intact, moving foot and toes well on exam.   Past Medical History:  Diagnosis Date  . Anxiety   . Depression   . Diabetes mellitus   . Dyspnea   . GERD (gastroesophageal reflux disease)   . Headache(784.0)   . HTN (hypertension)    dr Johnsie Cancel  . Hypercholesteremia    DENIES  . Osteoarthritis   . PONV (postoperative nausea and vomiting)   . RLS (restless legs syndrome)   . 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   Weight as of this encounter: 95.3  kg.  DVT Prophylaxis - Aspirin NWB to the RLE Keep RLE elevated  Discussed disposition with daughter. Patient has in-home care that she may be able to arrange for 24/7 assistance short-term following discharge. Will discuss with social work today to determine best plan for patient.   Plan for discharge tomorrow.  Theresa Duty, PA-C Orthopedic Surgery 03/08/2018, 7:17 AM

## 2018-03-08 NOTE — Plan of Care (Signed)
  Problem: Health Behavior/Discharge Planning: Goal: Ability to manage health-related needs will improve Outcome: Progressing   

## 2018-03-08 NOTE — Discharge Instructions (Addendum)
°  Dr. Gaynelle Arabian Total Joint Specialist [facility] [address] [place], [place] [address] [phone]   DISCHARGE INSTRUCTIONS  Blood clot prevention: Take one 325 mg Aspirin once a day for three weeks. Then take one 81 mg aspirin once a day for three weeks. Then discontinue aspirin.  Weight bearing status: Non-weight bearing to the right leg. Keep right leg in the knee immobilizer at all times.  Keep right leg elevated with a few pillows underneath the heel to help with swelling. Keep ice on the knee as well.  Follow-up Appointment: Follow-up in the office in 2 weeks with Dr. Wynelle Link.

## 2018-03-08 NOTE — Plan of Care (Signed)
  Problem: Nutrition: Goal: Adequate nutrition will be maintained Outcome: Progressing   Problem: Coping: Goal: Level of anxiety will decrease Outcome: Progressing   Problem: Pain Managment: Goal: General experience of comfort will improve Outcome: Progressing   Problem: Skin Integrity: Goal: Risk for impaired skin integrity will decrease Outcome: Progressing   

## 2018-03-09 LAB — GLUCOSE, CAPILLARY: Glucose-Capillary: 178 mg/dL — ABNORMAL HIGH (ref 70–99)

## 2018-03-09 MED ORDER — HYDROCODONE-ACETAMINOPHEN 5-325 MG PO TABS: 1.0000 | ORAL_TABLET | Freq: Four times a day (QID) | ORAL | 0 refills | Status: DC | PRN

## 2018-03-09 MED ORDER — ASPIRIN 325 MG PO TABS: 325.0000 mg | ORAL_TABLET | Freq: Every day | ORAL | 0 refills | Status: DC

## 2018-03-09 MED ORDER — ASPIRIN 325 MG PO TABS: 325.0000 mg | ORAL_TABLET | Freq: Every day | ORAL | 0 refills | Status: AC

## 2018-03-09 NOTE — Progress Notes (Signed)
   Subjective:    Patient reports pain as mild.   Patient seen in rounds by Dr. Wynelle Link. Patient is well, and has had no acute complaints or problems other than pain in the right knee. No issues overnight. Tolerating pain medications well.  Plan is to go Home after hospital stay.  Objective: Vital signs in last 24 hours: Temp:  [98.1 F (36.7 C)-98.5 F (36.9 C)] 98.1 F (36.7 C) (01/17 0551) Pulse Rate:  [57-118] 118 (01/17 0551) Resp:  [14-17] 17 (01/17 0551) BP: (113-118)/(65-74) 118/74 (01/17 0551) SpO2:  [94 %-100 %] 99 % (01/17 0551)  Intake/Output from previous day:  Intake/Output Summary (Last 24 hours) at 03/09/2018 0700 Last data filed at 03/09/2018 0557 Gross per 24 hour  Intake 1190 ml  Output 800 ml  Net 390 ml    Intake/Output this shift: Total I/O In: 20 [P.O.:20] Out: 250 [Urine:250]  Labs: Recent Labs    03/07/18 1206  HGB 10.8*   Recent Labs    03/07/18 1206  WBC 8.0  RBC 4.04  HCT 35.3*  PLT 213   Recent Labs    03/07/18 1206  NA 141  K 4.4  CL 106  CO2 26  BUN 41*  CREATININE 1.30*  GLUCOSE 195*  CALCIUM 9.0   No results for input(s): LABPT, INR in the last 72 hours.  Exam: General - Patient is Alert and Oriented Extremity - Neurologically intact Sensation intact distally Intact pulses distally Dorsiflexion/Plantar flexion intact Wearing knee immobilizer on right knee. Motor Function - intact, moving foot and toes well on exam.   Past Medical History:  Diagnosis Date  . Anxiety   . Depression   . Diabetes mellitus   . Dyspnea   . GERD (gastroesophageal reflux disease)   . Headache(784.0)   . HTN (hypertension)    dr Johnsie Cancel  . Hypercholesteremia    DENIES  . Osteoarthritis   . PONV (postoperative nausea and vomiting)   . RLS (restless legs syndrome)   . 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   Weight as of this encounter: 95.3 kg. Advance diet Up with therapy D/C IV fluids  DVT Prophylaxis - Aspirin NWB to the RLE Keep RLE elevated  Plan for discharge home today. Discussed disposition with daughter. Patient has in-home care available to her, and she will arrange for 24/7 assistance for a short period following discharge. Nurse case manager to arrange for bedside commode. Patient to remain non-weightbearing to the right lower extremity, with right lower extremity in knee immobilizer at all times. Follow up with Dr. Wynelle Link in two weeks.   Griffith Citron, PA-C Orthopedic Surgery 03/09/2018, 7:00 AM

## 2018-03-09 NOTE — Care Management Important Message (Signed)
Important Message  Patient Details  Name: [name] MRN: [id number] Date of Birth: 1940/10/15   Medicare Important Message Given:  Yes    [name] 03/09/2018, 9:48 AM

## 2018-03-09 NOTE — Plan of Care (Signed)

## 2018-03-09 NOTE — Care Management Note (Signed)
Case Management Note  Patient Details  Name: [name] MRN: [id number] Date of Birth: 1940-08-06  Subjective/Objective:Periprosthetic fx around internal prosthetic R knee joint. From home w/dtr support.Has 24hr/caregiver support. NWB RLE. [facility] for non emergency transport-confirmed address-forms in shadow chart-nurse to call [facility] when ready. No further Cm needs.                    Action/Plan:d/c home/[facility].   Expected Discharge Date:  03/09/18               Expected Discharge Plan:  Home/Self Care  In-House Referral:     Discharge planning Services  CM Consult  Post Acute Care Choice:    Choice offered to:     DME Arranged:    DME Agency:     HH Arranged:    HH Agency:     Status of Service:  Completed, signed off  If discussed at [employer] of Stay Meetings, dates discussed:    Additional Comments:  Dessa Phi, RN 03/09/2018, 9:53 AM

## 2018-03-12 NOTE — Discharge Summary (Signed)
Physician Discharge Summary   Patient ID: [name] MRN: [id number] DOB/AGE: 03-07-1940 78 y.o.  Admit date: 03/07/2018 Discharge date: 03/09/2018  Primary Diagnosis: Right tibia/fibula periprosthetic fracture  Admission Diagnoses:  Past Medical History:  Diagnosis Date  . Anxiety   . Depression   . Diabetes mellitus   . Dyspnea   . GERD (gastroesophageal reflux disease)   . Headache(784.0)   . HTN (hypertension)    dr Johnsie Cancel  . Hypercholesteremia    DENIES  . Osteoarthritis   . PONV (postoperative nausea and vomiting)   . RLS (restless legs syndrome)   . 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   Weight as of this encounter: 95.3 kg.  Procedure: None    Consults: None  HPI: [name] is a 78 y/o female with PMH significant for h/o right TKA who presented to [facility] Emergency Department on 03/07/2018 for right knee pain following a mechanical fall yesterday. Patient is accompanied by her daughter, who provided the history. Patient was at home yesterday around 4pm and being transferred from a wheelchair to the bed by a nursing assistant. When the daughter entered the room, [name] was on floor sitting up with her feet tucked underneath her. She immediately complained of right knee and ankle pain. Denied hitting head or LOC. EMS was called and the patient was cleared. The daughter states pain continued to have pain throughout the night despite use of Tylenol. EMS was called again and patient was brought to the emergency department. X-rays of the right knee showed prosthesis in good position with small fractures involving both the tibia and the fibula. Today patient is resting comfortably in bed, reports pain is controlled. Daughter reports patient is  non-ambulatory at baseline and uses walker for mobilization. Denies use of anticoagulants.  Laboratory Data: Admission on 03/07/2018, Discharged on 03/09/2018  Component Date Value Ref Range Status  . Glucose-Capillary 03/07/2018 190* 70 - 99 mg/dL Final  . WBC 03/07/2018 8.0  4.0 - 10.5 K/uL Final  . RBC 03/07/2018 4.04  3.87 - 5.11 MIL/uL Final  . Hemoglobin 03/07/2018 10.8* 12.0 - 15.0 g/dL Final  . HCT 03/07/2018 35.3* 36.0 - 46.0 % Final  . MCV 03/07/2018 87.4  80.0 - 100.0 fL Final  . MCH 03/07/2018 26.7  26.0 - 34.0 pg Final  . MCHC 03/07/2018 30.6  30.0 - 36.0 g/dL Final  . RDW 03/07/2018 15.1  11.5 - 15.5 % Final  . Platelets 03/07/2018 213  150 - 400 K/uL Final  . nRBC 03/07/2018 0.0  0.0 - 0.2 % Final  . Neutrophils Relative % 03/07/2018 73  % Final  . Neutro Abs 03/07/2018 5.7  1.7 - 7.7 K/uL Final  . Lymphocytes Relative 03/07/2018 19  % Final  . Lymphs Abs 03/07/2018 1.5  0.7 - 4.0 K/uL Final  . Monocytes Relative 03/07/2018 7  % Final  . Monocytes Absolute 03/07/2018 0.6  0.1 - 1.0 K/uL Final  . Eosinophils Relative 03/07/2018 1  % Final  . Eosinophils Absolute 03/07/2018 0.1  0.0 - 0.5 K/uL Final  . Basophils Relative 03/07/2018 0  % Final  . Basophils Absolute 03/07/2018 0.0  0.0 - 0.1 K/uL Final  . Immature Granulocytes 03/07/2018 0  % Final  . Abs Immature Granulocytes  03/07/2018 0.03  0.00 - 0.07 K/uL Final   Performed at [facility] [address] [place], [place] [address]  . Sodium 03/07/2018 141  135 - 145 mmol/L Final  . Potassium 03/07/2018 4.4  3.5 - 5.1 mmol/L Final  . Chloride 03/07/2018 106  98 - 111 mmol/L Final  . CO2 03/07/2018 26  22 - 32 mmol/L Final  . Glucose, Bld 03/07/2018 195* 70 - 99 mg/dL Final  . BUN 03/07/2018 41* 8 - 23 mg/dL Final  . Creatinine, Ser 03/07/2018 1.30* 0.44 - 1.00 mg/dL Final  . Calcium 03/07/2018 9.0  8.9 - 10.3 mg/dL Final  . GFR calc non Af Amer 03/07/2018 39* >60 mL/min Final  . GFR calc Af  Amer 03/07/2018 45* >60 mL/min Final  . Anion gap 03/07/2018 9  5 - 15 Final   Performed at [facility], [place] [address] [place], [place] [address]  . Glucose-Capillary 03/07/2018 214* 70 - 99 mg/dL Final  . Glucose-Capillary 03/07/2018 269* 70 - 99 mg/dL Final  . Glucose-Capillary 03/08/2018 170* 70 - 99 mg/dL Final  . Glucose-Capillary 03/08/2018 231* 70 - 99 mg/dL Final  . Glucose-Capillary 03/08/2018 178* 70 - 99 mg/dL Final  . Glucose-Capillary 03/08/2018 151* 70 - 99 mg/dL Final  . Glucose-Capillary 03/09/2018 178* 70 - 99 mg/dL Final     X-Rays:Dg Ankle Complete Right  Result Date: 03/07/2018 CLINICAL DATA:  Post fall yesterday now with ankle pain. EXAM: RIGHT ANKLE - COMPLETE 3+ VIEW COMPARISON:  None. FINDINGS: Osteopenia. No definite fracture or dislocation. Joint spaces are preserved. Ankle mortise appears preserved given obliquity. Tiny plantar calcaneal spur. Enthesopathic change involving the Achilles tendon insertion site. Regional soft tissues appear normal. No radiopaque foreign body. IMPRESSION: Osteopenia without definitive displaced fracture. Electronically Signed   By: Sandi Mariscal M.D.   On: 03/07/2018 10:12   Dg Knee Complete 4 Views Right  Result Date: 03/07/2018 CLINICAL DATA:  Post fall yesterday now with right knee pain. History of right knee replacement. EXAM: RIGHT KNEE - COMPLETE 4+ VIEW COMPARISON:  07/04/2016; 11/09/2013 FINDINGS: There is an acute minimally displaced slightly impacted tibial plateau fracture with suspected extension to involve the caudal aspect of the tibial component of the right total knee replacement. Additionally, there is a comminuted slightly impacted fracture involving the proximal neck of the fibula with extension to the proximal tib-fib joint. No definitive evidence of hardware failure or loosening involving the femoral component of the right total knee replacement. Patient has undergone interval intramedullary rod  fixation of the distal femur with persistent deformity about the distal femoral metaphysis. The femoral rod is transfixed with 4 cancellous screws however the cranial most cancellous screw is incompletely seated. Expected adjacent soft tissue swelling. No radiopaque foreign body. No joint effusion or evidence of lipohemarthrosis. IMPRESSION: 1. Acute minimally displaced slightly impacted tibial plateau fracture with suspected extension to involve the tibial component of the right total knee replacement. 2. Acute comminuted slightly impacted fracture involving the proximal neck of the fibula with extension to the proximal tib-fib joint. 3. No evidence of hardware failure or loosening involving the femoral component of the right total knee replacement. 4. Sequela of prior intramedullary rod fixation of the distal femur. There is no definitive evidence of hardware failure or loosening however the cranial most cancellous screw is incompletely seated. Electronically Signed   By: Sandi Mariscal M.D.   On: 03/07/2018 10:11    EKG: Orders placed or performed during the hospital  encounter of 09/10/16  . EKG 12-Lead  . EKG 12-Lead     Hospital Course: RONIN CRAGER is a 78 y.o. who was admitted to [facility] for pain management and disposition planning. Right knee was placed in immobilizer shortly after presentation to the Emergency Department once determining fractures were non-operative. Oral pain medication was ordered and patient was placed on Aspirin for DVT prophylaxis. On hospital day #1, patient was doing well with a moderate amount of pain that was exacerbated with attempted movement. She was tolerating pain medications well. Disposition was discussed with daughter, patient had in-home care prior to admission that she could arrange for 24/7 hour assistance short-term following discharge. Patient was seen during rounds on hospital day #2 and was ready for discharge. The daughter was able to arrange  adequate assistance at home and would only require a bedside commode upon discharge. She was discharged to home later that day in stable condition.   Diet: Diabetic diet Activity: NWB to the RLE Follow-up: in 2 weeks Disposition: Home Discharged Condition: stable   Discharge Instructions    Call MD / Call 911   Complete by:  As directed    If you experience chest pain or shortness of breath, CALL 911 and be transported to the hospital emergency room.  If you develope a fever above 101 F, pus (white drainage) or increased drainage or redness at the wound, or calf pain, call your surgeon's office.   Constipation Prevention   Complete by:  As directed    Drink plenty of fluids.  Prune juice may be helpful.  You may use a stool softener, such as Colace (over the counter) 100 mg twice a day.  Use MiraLax (over the counter) for constipation as needed.   Diet - low sodium heart healthy   Complete by:  As directed    Non weight bearing   Complete by:  As directed      Allergies as of 03/09/2018      Reactions   Flounder [fish Allergy] Other (See Comments)   Pt breaks out in bumps   Morphine And Related Other (See Comments)   Reaction:  Hallucinations    Oxycodone Other (See Comments)   Reaction:  Hallucinations   Sulfonamide Derivatives Swelling, Other (See Comments)   Reaction:  Unspecified swelling reaction       Medication List    STOP taking these medications   aspirin 81 MG chewable tablet Replaced by:  aspirin 325 MG tablet   methocarbamol 500 MG tablet Commonly known as:  ROBAXIN     TAKE these medications   acetaminophen 500 MG tablet Commonly known as:  TYLENOL Take 500 mg by mouth every 8 (eight) hours as needed for mild pain.   ALPRAZolam 0.25 MG tablet Commonly known as:  XANAX Take 1 tablet (0.25 mg total) by mouth at bedtime as needed for anxiety.   amLODipine 5 MG tablet Commonly known as:  NORVASC Take 1 tablet (5 mg total) by mouth daily.   aspirin  325 MG tablet Take 1 tablet (325 mg total) by mouth daily for 19 doses. Then take one 81 mg aspirin once a day for three weeks. Then discontinue aspirin. Replaces:  aspirin 81 MG chewable tablet   bisacodyl 10 MG suppository Commonly known as:  DULCOLAX Place 1 suppository (10 mg total) rectally daily as needed for moderate constipation.   DERMACLOUD Crea Apply 1 application topically as needed (each change).   divalproex 125 MG capsule  Commonly known as:  DEPAKOTE SPRINKLE Take 125 mg by mouth daily.   HYDROcodone-acetaminophen 5-325 MG tablet Commonly known as:  NORCO/VICODIN Take 1-2 tablets by mouth every 6 (six) hours as needed for moderate pain or severe pain.   insulin aspart 100 UNIT/ML injection Commonly known as:  novoLOG Inject 2-11 Units into the skin 3 (three) times daily before meals. Pt uses per sliding scale:    101-150:  2 units 151-200:  3 units  201-250:  5 units  251-300:  7 units  301-350:  9 units  Greater than 350:  11 units Greater than 400 or less than 60:  Call MD   LEVEMIR FLEXTOUCH 100 UNIT/ML Pen Generic drug:  Insulin Detemir Inject 18 Units into the skin 2 (two) times daily. Pt uses 12 units in the morning and 8 units at bedtime.   loratadine-pseudoephedrine 5-120 MG tablet Commonly known as:  CLARITIN-D 12-hour Take 1 tablet by mouth 2 (two) times daily as needed for allergies.   losartan 25 MG tablet Commonly known as:  COZAAR Take 100 mg by mouth daily.   Magnesium Oxide 500 MG Tabs Take 500 mg by mouth 2 (two) times daily.   metoprolol succinate 50 MG 24 hr tablet Commonly known as:  TOPROL-XL Take 1 tablet (50 mg total) by mouth daily.   mirtazapine 15 MG tablet Commonly known as:  REMERON Take 15 mg by mouth at bedtime.   omeprazole 20 MG capsule Commonly known as:  PRILOSEC Take 20 mg by mouth daily as needed (acid reflux).   ondansetron 4 MG tablet Commonly known as:  ZOFRAN Take 4 mg by mouth every 6 (six) hours as  needed for nausea/vomiting.   pramipexole 1.5 MG tablet Commonly known as:  MIRAPEX Take 1.5 mg by mouth 3 (three) times daily.            Discharge Care Instructions  (From admission, onward)         Start     Ordered   03/08/18 0000  Non weight bearing     03/08/18 0725         Follow-up Information    Gaynelle Arabian, MD. Schedule an appointment as soon as possible for a visit on 03/22/2018.   Specialty:  Orthopedic Surgery Contact information: [address] [place] [address] [phone]           Signed: Theresa Duty, PA-C Orthopedic Surgery 03/12/2018, 11:39 AM

## 2018-05-10 ENCOUNTER — Inpatient Hospital Stay (HOSPITAL_COMMUNITY): Payer: Medicare Other

## 2018-05-10 ENCOUNTER — Emergency Department (HOSPITAL_COMMUNITY): Payer: Medicare Other

## 2018-05-10 ENCOUNTER — Encounter (HOSPITAL_COMMUNITY): Payer: Self-pay

## 2018-05-10 ENCOUNTER — Inpatient Hospital Stay (HOSPITAL_COMMUNITY)
Admission: EM | Admit: 2018-05-10 | Discharge: 2018-05-14 | DRG: 871 | Disposition: A | Discharge status: Home / Self Care | Payer: Medicare Other | Attending: Internal Medicine | Admitting: Internal Medicine

## 2018-05-10 DIAGNOSIS — E119 Type 2 diabetes mellitus without complications: Secondary | ICD-10-CM

## 2018-05-10 DIAGNOSIS — R509 Fever, unspecified: Secondary | ICD-10-CM | POA: Diagnosis present

## 2018-05-10 DIAGNOSIS — N179 Acute kidney failure, unspecified: Secondary | ICD-10-CM | POA: Diagnosis present

## 2018-05-10 DIAGNOSIS — Z794 Long term (current) use of insulin: Secondary | ICD-10-CM

## 2018-05-10 DIAGNOSIS — R6520 Severe sepsis without septic shock: Secondary | ICD-10-CM | POA: Diagnosis not present

## 2018-05-10 DIAGNOSIS — G9340 Encephalopathy, unspecified: Secondary | ICD-10-CM | POA: Diagnosis not present

## 2018-05-10 DIAGNOSIS — Z87440 Personal history of urinary (tract) infections: Secondary | ICD-10-CM | POA: Diagnosis not present

## 2018-05-10 DIAGNOSIS — L89151 Pressure ulcer of sacral region, stage 1: Secondary | ICD-10-CM | POA: Diagnosis not present

## 2018-05-10 DIAGNOSIS — L89152 Pressure ulcer of sacral region, stage 2: Secondary | ICD-10-CM | POA: Diagnosis present

## 2018-05-10 DIAGNOSIS — E860 Dehydration: Secondary | ICD-10-CM | POA: Diagnosis present

## 2018-05-10 DIAGNOSIS — G9341 Metabolic encephalopathy: Secondary | ICD-10-CM | POA: Diagnosis present

## 2018-05-10 DIAGNOSIS — E872 Acidosis, unspecified: Secondary | ICD-10-CM | POA: Diagnosis present

## 2018-05-10 DIAGNOSIS — E7800 Pure hypercholesterolemia, unspecified: Secondary | ICD-10-CM | POA: Diagnosis present

## 2018-05-10 DIAGNOSIS — G2581 Restless legs syndrome: Secondary | ICD-10-CM | POA: Diagnosis present

## 2018-05-10 DIAGNOSIS — E1165 Type 2 diabetes mellitus with hyperglycemia: Secondary | ICD-10-CM | POA: Diagnosis present

## 2018-05-10 DIAGNOSIS — Z833 Family history of diabetes mellitus: Secondary | ICD-10-CM

## 2018-05-10 DIAGNOSIS — L8990 Pressure ulcer of unspecified site, unspecified stage: Secondary | ICD-10-CM

## 2018-05-10 DIAGNOSIS — Z79899 Other long term (current) drug therapy: Secondary | ICD-10-CM

## 2018-05-10 DIAGNOSIS — R9431 Abnormal electrocardiogram [ECG] [EKG]: Secondary | ICD-10-CM | POA: Diagnosis present

## 2018-05-10 DIAGNOSIS — I10 Essential (primary) hypertension: Secondary | ICD-10-CM | POA: Diagnosis present

## 2018-05-10 DIAGNOSIS — Z885 Allergy status to narcotic agent status: Secondary | ICD-10-CM

## 2018-05-10 DIAGNOSIS — F0150 Vascular dementia without behavioral disturbance: Secondary | ICD-10-CM | POA: Diagnosis not present

## 2018-05-10 DIAGNOSIS — Z7401 Bed confinement status: Secondary | ICD-10-CM | POA: Diagnosis not present

## 2018-05-10 DIAGNOSIS — M1990 Unspecified osteoarthritis, unspecified site: Secondary | ICD-10-CM | POA: Diagnosis present

## 2018-05-10 DIAGNOSIS — F0280 Dementia in other diseases classified elsewhere without behavioral disturbance: Secondary | ICD-10-CM | POA: Diagnosis not present

## 2018-05-10 DIAGNOSIS — F329 Major depressive disorder, single episode, unspecified: Secondary | ICD-10-CM | POA: Diagnosis present

## 2018-05-10 DIAGNOSIS — Z882 Allergy status to sulfonamides status: Secondary | ICD-10-CM

## 2018-05-10 DIAGNOSIS — Z8701 Personal history of pneumonia (recurrent): Secondary | ICD-10-CM | POA: Diagnosis not present

## 2018-05-10 DIAGNOSIS — F419 Anxiety disorder, unspecified: Secondary | ICD-10-CM | POA: Diagnosis present

## 2018-05-10 DIAGNOSIS — B3749 Other urogenital candidiasis: Secondary | ICD-10-CM | POA: Diagnosis present

## 2018-05-10 DIAGNOSIS — Z90710 Acquired absence of both cervix and uterus: Secondary | ICD-10-CM | POA: Diagnosis not present

## 2018-05-10 DIAGNOSIS — E876 Hypokalemia: Secondary | ICD-10-CM | POA: Diagnosis present

## 2018-05-10 DIAGNOSIS — Z91013 Allergy to seafood: Secondary | ICD-10-CM

## 2018-05-10 DIAGNOSIS — A419 Sepsis, unspecified organism: Secondary | ICD-10-CM | POA: Diagnosis present

## 2018-05-10 DIAGNOSIS — E870 Hyperosmolality and hypernatremia: Secondary | ICD-10-CM | POA: Diagnosis present

## 2018-05-10 DIAGNOSIS — K219 Gastro-esophageal reflux disease without esophagitis: Secondary | ICD-10-CM | POA: Diagnosis present

## 2018-05-10 DIAGNOSIS — F0390 Unspecified dementia without behavioral disturbance: Secondary | ICD-10-CM | POA: Diagnosis present

## 2018-05-10 LAB — COMPREHENSIVE METABOLIC PANEL
ALT: 18 U/L (ref 0–44)
AST: 13 U/L — ABNORMAL LOW (ref 15–41)
Albumin: 4.1 g/dL (ref 3.5–5.0)
Alkaline Phosphatase: 75 U/L (ref 38–126)
Anion gap: 15 (ref 5–15)
BUN: 75 mg/dL — ABNORMAL HIGH (ref 8–23)
CO2: 21 mmol/L — ABNORMAL LOW (ref 22–32)
CREATININE: 2.21 mg/dL — AB (ref 0.44–1.00)
Calcium: 9.7 mg/dL (ref 8.9–10.3)
Chloride: 120 mmol/L — ABNORMAL HIGH (ref 98–111)
GFR calc Af Amer: 24 mL/min — ABNORMAL LOW (ref >60)
GFR, EST NON AFRICAN AMERICAN: 21 mL/min — AB (ref >60)
Glucose, Bld: 568 mg/dL (ref 70–99)
Potassium: 4.5 mmol/L (ref 3.5–5.1)
Sodium: 156 mmol/L — ABNORMAL HIGH (ref 135–145)
Total Bilirubin: 1.1 mg/dL (ref 0.3–1.2)
Total Protein: 8.2 g/dL — ABNORMAL HIGH (ref 6.5–8.1)

## 2018-05-10 LAB — CBC WITH DIFFERENTIAL/PLATELET
Abs Immature Granulocytes: 0.05 10*3/uL (ref 0.00–0.07)
Basophils Absolute: 0 10*3/uL (ref 0.0–0.1)
Basophils Relative: 0 %
EOS PCT: 0 %
Eosinophils Absolute: 0 10*3/uL (ref 0.0–0.5)
HCT: 47.2 % — ABNORMAL HIGH (ref 36.0–46.0)
Hemoglobin: 13.5 g/dL (ref 12.0–15.0)
Immature Granulocytes: 0 %
Lymphocytes Relative: 10 %
Lymphs Abs: 1.3 10*3/uL (ref 0.7–4.0)
MCH: 25.8 pg — ABNORMAL LOW (ref 26.0–34.0)
MCHC: 28.6 g/dL — AB (ref 30.0–36.0)
MCV: 90.1 fL (ref 80.0–100.0)
Monocytes Absolute: 0.5 10*3/uL (ref 0.1–1.0)
Monocytes Relative: 4 %
Neutro Abs: 10.7 10*3/uL — ABNORMAL HIGH (ref 1.7–7.7)
Neutrophils Relative %: 86 %
Platelets: 189 10*3/uL (ref 150–400)
RBC: 5.24 MIL/uL — ABNORMAL HIGH (ref 3.87–5.11)
RDW: 16.1 % — ABNORMAL HIGH (ref 11.5–15.5)
WBC: 12.5 10*3/uL — ABNORMAL HIGH (ref 4.0–10.5)
nRBC: 0 % (ref 0.0–0.2)

## 2018-05-10 LAB — INFLUENZA PANEL BY PCR (TYPE A & B)
INFLBPCR: NEGATIVE
Influenza A By PCR: NEGATIVE

## 2018-05-10 LAB — BASIC METABOLIC PANEL
Anion gap: 9 (ref 5–15)
Anion gap: 11 (ref 5–15)
BUN: 71 mg/dL — ABNORMAL HIGH (ref 8–23)
BUN: 77 mg/dL — ABNORMAL HIGH (ref 8–23)
CO2: 23 mmol/L (ref 22–32)
CO2: 21 mmol/L — ABNORMAL LOW (ref 22–32)
Calcium: 8.8 mg/dL — ABNORMAL LOW (ref 8.9–10.3)
Calcium: 9.3 mg/dL (ref 8.9–10.3)
Chloride: 120 mmol/L — ABNORMAL HIGH (ref 98–111)
Chloride: 124 mmol/L — ABNORMAL HIGH (ref 98–111)
Creatinine, Ser: 2.37 mg/dL — ABNORMAL HIGH (ref 0.44–1.00)
Creatinine, Ser: 2.28 mg/dL — ABNORMAL HIGH (ref 0.44–1.00)
GFR calc Af Amer: 22 mL/min — ABNORMAL LOW (ref >60)
GFR calc Af Amer: 23 mL/min — ABNORMAL LOW (ref >60)
GFR calc non Af Amer: 19 mL/min — ABNORMAL LOW (ref >60)
GFR calc non Af Amer: 20 mL/min — ABNORMAL LOW (ref >60)
Glucose, Bld: 514 mg/dL (ref 70–99)
Glucose, Bld: 339 mg/dL — ABNORMAL HIGH (ref 70–99)
Potassium: 3.7 mmol/L (ref 3.5–5.1)
Potassium: 3.6 mmol/L (ref 3.5–5.1)
Sodium: 152 mmol/L — ABNORMAL HIGH (ref 135–145)
Sodium: 156 mmol/L — ABNORMAL HIGH (ref 135–145)

## 2018-05-10 LAB — SODIUM, URINE, RANDOM: SODIUM UR: 52 mmol/L

## 2018-05-10 LAB — CBG MONITORING, ED
Glucose-Capillary: 466 mg/dL — ABNORMAL HIGH (ref 70–99)
Glucose-Capillary: 505 mg/dL (ref 70–99)
Glucose-Capillary: 380 mg/dL — ABNORMAL HIGH (ref 70–99)

## 2018-05-10 LAB — LACTIC ACID, PLASMA
Lactic Acid, Venous: 2.4 mmol/L (ref 0.5–1.9)
Lactic Acid, Venous: 1.7 mmol/L (ref 0.5–1.9)

## 2018-05-10 LAB — AMMONIA: Ammonia: 10 umol/L (ref 9–35)

## 2018-05-10 LAB — GLUCOSE, CAPILLARY
Glucose-Capillary: 291 mg/dL — ABNORMAL HIGH (ref 70–99)
Glucose-Capillary: 250 mg/dL — ABNORMAL HIGH (ref 70–99)
Glucose-Capillary: 247 mg/dL — ABNORMAL HIGH (ref 70–99)
Glucose-Capillary: 221 mg/dL — ABNORMAL HIGH (ref 70–99)

## 2018-05-10 LAB — URINALYSIS, ROUTINE W REFLEX MICROSCOPIC
Bilirubin Urine: NEGATIVE
Glucose, UA: >=500 mg/dL — AB
KETONES UR: 5 mg/dL — AB
Nitrite: NEGATIVE
Protein, ur: NEGATIVE mg/dL
RBC / HPF: >50 RBC/hpf — ABNORMAL HIGH (ref 0–5)
Specific Gravity, Urine: 1.017 (ref 1.005–1.030)
WBC, UA: >50 WBC/hpf — ABNORMAL HIGH (ref 0–5)
pH: 5 (ref 5.0–8.0)

## 2018-05-10 LAB — VALPROIC ACID LEVEL: Valproic Acid Lvl: <10 ug/mL — ABNORMAL LOW (ref 50.0–100.0)

## 2018-05-10 LAB — PROTIME-INR
INR: 1.3 — ABNORMAL HIGH (ref 0.8–1.2)
Prothrombin Time: 15.6 seconds — ABNORMAL HIGH (ref 11.4–15.2)

## 2018-05-10 LAB — MAGNESIUM: Magnesium: 2.3 mg/dL (ref 1.7–2.4)

## 2018-05-10 MED ORDER — LACTATED RINGERS IV BOLUS
1000.0000 mL | Freq: Once | INTRAVENOUS | Status: AC
  Administered 2018-05-10: 1000 mL via INTRAVENOUS

## 2018-05-10 MED ORDER — SODIUM CHLORIDE 0.9 % IV BOLUS: 1000.0000 mL | Freq: Once | INTRAVENOUS | Status: DC

## 2018-05-10 MED ORDER — METRONIDAZOLE IN NACL 5-0.79 MG/ML-% IV SOLN
500.0000 mg | Freq: Once | INTRAVENOUS | Status: AC
  Administered 2018-05-10: 500 mg via INTRAVENOUS
  Filled 2018-05-10: qty 100

## 2018-05-10 MED ORDER — SODIUM CHLORIDE 0.9 % IV SOLN
2.0000 g | Freq: Once | INTRAVENOUS | Status: AC
  Administered 2018-05-10: 2 g via INTRAVENOUS
  Filled 2018-05-10: qty 2

## 2018-05-10 MED ORDER — DEXTROSE-NACL 5-0.9 % IV SOLN
INTRAVENOUS | Status: DC
  Administered 2018-05-10 – 2018-05-11 (×2): via INTRAVENOUS

## 2018-05-10 MED ORDER — VANCOMYCIN HCL IN DEXTROSE 1-5 GM/200ML-% IV SOLN
1000.0000 mg | Freq: Once | INTRAVENOUS | Status: AC
  Administered 2018-05-10: 1000 mg via INTRAVENOUS
  Filled 2018-05-10: qty 200

## 2018-05-10 MED ORDER — DEXTROSE 50 % IV SOLN: 25.0000 mL | INTRAVENOUS | Status: DC | PRN

## 2018-05-10 MED ORDER — SODIUM CHLORIDE 0.9 % IV SOLN
INTRAVENOUS | Status: DC
  Administered 2018-05-10 – 2018-05-13 (×5): via INTRAVENOUS

## 2018-05-10 MED ORDER — INSULIN ASPART 100 UNIT/ML ~~LOC~~ SOLN
10.0000 [IU] | Freq: Once | SUBCUTANEOUS | Status: AC
  Administered 2018-05-10: 10 [IU] via SUBCUTANEOUS
  Filled 2018-05-10: qty 1

## 2018-05-10 MED ORDER — INSULIN ASPART 100 UNIT/ML ~~LOC~~ SOLN
0.0000 [IU] | SUBCUTANEOUS | Status: DC
  Filled 2018-05-10: qty 1

## 2018-05-10 MED ORDER — ACETAMINOPHEN 650 MG RE SUPP
650.0000 mg | Freq: Once | RECTAL | Status: AC
  Administered 2018-05-10: 650 mg via RECTAL
  Filled 2018-05-10: qty 1

## 2018-05-10 MED ORDER — SODIUM CHLORIDE 0.9 % IV SOLN
1.0000 g | INTRAVENOUS | Status: DC
  Administered 2018-05-10 – 2018-05-11 (×2): 1 g via INTRAVENOUS
  Filled 2018-05-10: qty 10
  Filled 2018-05-10: qty 1

## 2018-05-10 MED ORDER — SODIUM CHLORIDE 0.9% FLUSH
3.0000 mL | Freq: Once | INTRAVENOUS | Status: AC
  Administered 2018-05-10: 3 mL via INTRAVENOUS

## 2018-05-10 MED ORDER — HEPARIN SODIUM (PORCINE) 5000 UNIT/ML IJ SOLN
5000.0000 [IU] | Freq: Three times a day (TID) | INTRAMUSCULAR | Status: DC
  Administered 2018-05-10 – 2018-05-14 (×11): 5000 [IU] via SUBCUTANEOUS
  Filled 2018-05-10 (×10): qty 1

## 2018-05-10 MED ORDER — ACETAMINOPHEN 650 MG RE SUPP: 650.0000 mg | Freq: Four times a day (QID) | RECTAL | Status: DC | PRN

## 2018-05-10 MED ORDER — ACETAMINOPHEN 325 MG PO TABS: 650.0000 mg | ORAL_TABLET | Freq: Four times a day (QID) | ORAL | Status: DC | PRN

## 2018-05-10 MED ORDER — POTASSIUM CHLORIDE 10 MEQ/100ML IV SOLN
10.0000 meq | INTRAVENOUS | Status: AC
  Administered 2018-05-10: 10 meq via INTRAVENOUS
  Filled 2018-05-10: qty 100

## 2018-05-10 MED ORDER — INSULIN REGULAR(HUMAN) IN NACL 100-0.9 UT/100ML-% IV SOLN
INTRAVENOUS | Status: DC
  Administered 2018-05-10: 4.5 [IU]/h via INTRAVENOUS
  Filled 2018-05-10: qty 100

## 2018-05-10 MED ORDER — SODIUM CHLORIDE 0.9 % IV SOLN
INTRAVENOUS | Status: DC
  Administered 2018-05-10: 17:00:00 via INTRAVENOUS

## 2018-05-10 MED ORDER — INSULIN REGULAR BOLUS VIA INFUSION
0.0000 [IU] | Freq: Three times a day (TID) | INTRAVENOUS | Status: DC
  Filled 2018-05-10: qty 10

## 2018-05-10 NOTE — ED Notes (Signed)
Date and time results received: 05/10/18 7:44 AM  (use smartphrase ".now" to insert current time)  Test: Lactic Critical Value: 2.4  Name of Provider Notified: Alvino Chapel  Orders Received? Or Actions Taken?: awaiting orders

## 2018-05-10 NOTE — ED Notes (Signed)
RN attempted to obtain urine sample via straight cath without success. MD made aware. 2L LR started at this time.

## 2018-05-10 NOTE — Progress Notes (Signed)
Inpatient Diabetes Program Recommendations  AACE/ADA: New Consensus Statement on Inpatient Glycemic Control (2015)  Target Ranges:  Prepandial:   less than 140 mg/dL      Peak postprandial:   less than 180 mg/dL (1-2 hours)      Critically ill patients:  140 - 180 mg/dL   Lab Results  Component Value Date   GLUCAP 466 (H) 05/10/2018   HGBA1C 10.8 (H) 09/11/2016    Review of Glycemic Control  Diabetes history: DM2 Outpatient Diabetes medications: D/C on 03/09/18 listed Levemir 18 units bid (patient had been taking 12 am + 8 pm) + Novolog 2-11 unit ac meals tid Current orders for Inpatient glycemic control: Novolog correction sensitive q 4 hrs  Inpatient Diabetes Program Recommendations:   -Please consider  -IV insulin drip. Pt's blood glucose 568 on admission -Levemir 9 units bid Spoke with RN Arsenio Loader and requested another CBG completed. First CBG 466 @ 807 am & Novolog 10 units given @ 1201 pm.  Thank you, Nani Gasser. Taquilla Downum, RN, MSN, CDE  Diabetes Coordinator Inpatient Glycemic Control Team Team Pager [phone] (8am-5pm) 05/10/2018 1:23 PM

## 2018-05-10 NOTE — ED Notes (Signed)
Pt report given to Lani, RN at this time.

## 2018-05-10 NOTE — Progress Notes (Signed)
A consult was received from an ED physician for Cefepime/Vanc per pharmacy dosing.  The patient's profile has been reviewed for ht/wt/allergies/indication/available labs.   A one time order has been placed for Cefepime 2gm & Vancomycin 1gm IV.  Further antibiotics/pharmacy consults should be ordered by admitting physician if indicated.                       Thank you, Biagio Borg 05/10/2018  7:27 AM

## 2018-05-10 NOTE — ED Provider Notes (Addendum)
[facility] DEPT Provider Note   CSN: [id number] Arrival date & time: 05/10/18  0711    History   Chief Complaint Chief Complaint  Patient presents with   Fever   Cough    HPI [name] is a 78 y.o. female.    Level 5 caveat due to altered mental status. HPI Patient brought in for fever and mental status change.  Reportedly had a cough yesterday.  EMS reportedly had temperature 106.6.  Patient cannot provide much history and is minimally responsive at this time.  Did have right lower extremity surgery about 2 months ago. 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 07/04/2016   Abdominal pain, chronic, epigastric 08/10/2015   Dehydration    Leukocytosis    Aspiration pneumonia ([place]) 07/21/2014   DM type 2 (diabetes mellitus, type 2) ([place]) 07/21/2014   Palliative care encounter 07/21/2014   Dysphagia 07/09/2014   Decubitus ulcer 07/09/2014   Blood poisoning    Acute respiratory failure with hypoxia ([place]) 06/30/2014   Septic shock ([place]) 06/30/2014   Lactic acidosis 06/30/2014   Dementia ([place]) 06/30/2014   Acute respiratory failure with hypoxemia ([facility])    Hypernatremia    Acute kidney injury ([place]) 05/28/2014   Fever 05/27/2014   Lower urinary tract infectious disease 05/27/2014    Sepsis ([place]) 05/27/2014   Acute encephalopathy    FTT (failure to thrive) in adult    Gait instability 07/18/2013    Class: Acute   Low back pain 07/18/2013    Class: Acute   Nausea with vomiting 07/18/2013    Class: Acute   Abnormality of gait 04/29/2013   Spinal stenosis, lumbar region, 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   Head trauma 06/03/2010   HYPERCHOLESTEROLEMIA 05/15/2008   ANXIETY DEPRESSION 05/15/2008   DEPRESSION 05/15/2008   Essential hypertension 05/15/2008   OSTEOARTHRITIS 05/15/2008   Edema 05/15/2008   DYSPNEA ON EXERTION 05/15/2008   ELECTROCARDIOGRAM, ABNORMAL 05/15/2008    Past Surgical History:  Procedure Laterality Date   ABDOMINAL HYSTERECTOMY     COLONOSCOPY     JOINT REPLACEMENT     left forearm fracture with ORIF Left    LUMBAR LAMINECTOMY/DECOMPRESSION MICRODISCECTOMY N/A 04/18/2012   Procedure: LUMBAR LAMINECTOMY/DECOMPRESSION MICRODISCECTOMY 3 LEVELS;  Surgeon: Winfield Cunas, MD;  Location: [facility];  Service: Neurosurgery;  Laterality: N/A;  Lumbar three-four, lumbar four-five, lumbar five-sacral one decompression. Synovial cyst resection lumbar four-five   ORIF PERIPROSTHETIC FRACTURE Right 07/06/2016   Procedure: OPEN REDUCTION INTERNAL FIXATION (ORIF) PERIPROSTHETIC FRACTURE;  Surgeon: Gaynelle Arabian, MD;  Location: [facility];  Service: Orthopedics;  Laterality: Right;   REPLACEMENT TOTAL KNEE Right    WISDOM TOOTH EXTRACTION       OB History   No obstetric history on file.      Home Medications  Prior to Admission medications   Medication Sig Start Date End Date Taking? Authorizing Provider  acetaminophen (TYLENOL) 500 MG tablet Take 500 mg by mouth every 8 (eight) hours as needed for mild pain.    [provider]  ALPRAZolam Duanne Moron) 0.25 MG tablet Take 1 tablet (0.25 mg total) by mouth at bedtime as needed for anxiety. 09/12/16   Dessa Phi, DO  amLODipine (NORVASC) 5 MG tablet Take 1 tablet  (5 mg total) by mouth daily. 09/12/16   Dessa Phi, DO  bisacodyl (DULCOLAX) 10 MG suppository Place 1 suppository (10 mg total) rectally daily as needed for moderate constipation. Patient not taking: Reported on 03/07/2018 07/08/16   Dara Lords, Alexzandrew L, PA-C  divalproex (DEPAKOTE SPRINKLE) 125 MG capsule Take 125 mg by mouth daily. 02/08/18   [provider]  HYDROcodone-acetaminophen (NORCO/VICODIN) 5-325 MG tablet Take 1-2 tablets by mouth every 6 (six) hours as needed for moderate pain or severe pain. 03/09/18   Edmisten, Ok Anis, PA  Infant Care Products (DERMACLOUD) CREA Apply 1 application topically as needed (each change).    [provider]  insulin aspart (NOVOLOG) 100 UNIT/ML injection Inject 2-11 Units into the skin 3 (three) times daily before meals. Pt uses per sliding scale:    101-150:  2 units 151-200:  3 units  201-250:  5 units  251-300:  7 units  301-350:  9 units  Greater than 350:  11 units Greater than 400 or less than 60:  Call MD    [provider]  Insulin Detemir (LEVEMIR FLEXTOUCH) 100 UNIT/ML Pen Inject 18 Units into the skin 2 (two) times daily. Pt uses 12 units in the morning and 8 units at bedtime.    [provider]  loratadine-pseudoephedrine (CLARITIN-D 12-HOUR) 5-120 MG tablet Take 1 tablet by mouth 2 (two) times daily as needed for allergies.    [provider]  losartan (COZAAR) 25 MG tablet Take 100 mg by mouth daily.    [provider]  Magnesium Oxide 500 MG TABS Take 500 mg by mouth 2 (two) times daily.     [provider]  metoprolol succinate (TOPROL-XL) 50 MG 24 hr tablet Take 1 tablet (50 mg total) by mouth daily. 07/04/14   Robbie Lis, MD  mirtazapine (REMERON) 15 MG tablet Take 15 mg by mouth at bedtime. 02/27/18   [provider]  omeprazole (PRILOSEC) 20 MG capsule Take 20 mg by mouth daily as needed (acid reflux).    [provider]  ondansetron (ZOFRAN) 4  MG tablet Take 4 mg by mouth every 6 (six) hours as needed for nausea/vomiting. 02/13/18   [provider]  pramipexole (MIRAPEX) 1.5 MG tablet Take 1.5 mg by mouth 3 (three) times daily.    [provider]    Family History Family History  Problem Relation Age of Onset   Diabetes Father    High blood pressure Father    High blood pressure Mother    Colon cancer Neg Hx    Esophageal cancer Neg Hx    Liver disease Neg Hx    Stomach cancer Neg Hx    Pancreatic cancer Neg Hx     Social History Social History   Tobacco Use   Smoking status: Never Smoker   Smokeless tobacco: Never Used  Substance Use Topics   Alcohol use: No    Alcohol/week: 0.0 standard drinks   Drug use: No     Allergies   Flounder [fish allergy]; Morphine  and related; Oxycodone; and Sulfonamide derivatives   Review of Systems Review of Systems  Unable to perform ROS: Mental status change     Physical Exam Updated Vital Signs BP 119/74    Pulse 86    Temp (!) 103.5 F (39.7 C) (Axillary)    Resp (!) 29    SpO2 99%   Physical Exam Constitutional:      Comments: Sitting in bed with eyes closed.  HENT:     Head: Atraumatic.  Eyes:     Pupils: Pupils are equal, round, and reactive to light.  Cardiovascular:     Comments: Mild tachycardia Pulmonary:     Breath sounds: No wheezing or rhonchi.     Comments: Tachypnea Abdominal:     General: There is no distension.     Tenderness: There is no abdominal tenderness.     Hernia: No hernia is present.  Musculoskeletal:     Comments: Right lower extremity in immobilizer.  Immobilizer removed and no skin changes.  No fluctuance.  Skin:    Capillary Refill: Capillary refill takes less than 2 seconds.  Neurological:     Comments: Breathing spontaneously.  Minimal response to pain.  Minimal gag reflex.      ED Treatments / Results  Labs (all labs ordered are listed, but only abnormal results are displayed) Labs  Reviewed  COMPREHENSIVE METABOLIC PANEL - Abnormal; Notable for the following components:      Result Value   Sodium 156 (*)    Chloride 120 (*)    CO2 21 (*)    Glucose, Bld 568 (*)    BUN 75 (*)    Creatinine, Ser 2.21 (*)    Total Protein 8.2 (*)    AST 13 (*)    GFR calc non Af Amer 21 (*)    GFR calc Af Amer 24 (*)    All other components within normal limits  LACTIC ACID, PLASMA - Abnormal; Notable for the following components:   Lactic Acid, Venous 2.4 (*)    All other components within normal limits  CBC WITH DIFFERENTIAL/PLATELET - Abnormal; Notable for the following components:   WBC 12.5 (*)    RBC 5.24 (*)    HCT 47.2 (*)    MCH 25.8 (*)    MCHC 28.6 (*)    RDW 16.1 (*)    Neutro Abs 10.7 (*)    All other components within normal limits  PROTIME-INR - Abnormal; Notable for the following components:   Prothrombin Time 15.6 (*)    INR 1.3 (*)    All other components within normal limits  CBG MONITORING, ED - Abnormal; Notable for the following components:   Glucose-Capillary 466 (*)    All other components within normal limits  CULTURE, BLOOD (ROUTINE X 2)  CULTURE, BLOOD (ROUTINE X 2)  LACTIC ACID, PLASMA  URINALYSIS, ROUTINE W REFLEX MICROSCOPIC  VALPROIC ACID LEVEL  AMMONIA  INFLUENZA PANEL BY PCR (TYPE A & B)    EKG EKG Interpretation  Date/Time:  Saturday Jul 09 2016 16:36:25 EDT Ventricular Rate:  75 PR Interval:    QRS Duration: 98 QT Interval:  408 QTC Calculation: 456 R Axis:   -21 Text Interpretation:  Sinus rhythm Atrial premature complex LVH with secondary repolarization abnormality Baseline wander in lead(s) V6 Confirmed by Hazle Coca [phone]) on 07/09/2016 4:48:23 PM Also confirmed by Quintella Reichert [phone]), editor Hattie Perch [phone])  on 05/10/2018 7:47:51 AM   Radiology Dg Chest Portable 1 View  Result Date: 05/10/2018 CLINICAL DATA:  Fevers and cough EXAM: PORTABLE CHEST 1 VIEW COMPARISON:  09/17/2016 FINDINGS: Cardiac shadows  within normal limits. The lungs are well aerated bilaterally. No focal infiltrate or sizable effusion is seen. No acute bony abnormality is noted. Calcified granuloma is noted in the right mid lung. IMPRESSION: No acute abnormality noted. Electronically Signed   By: Inez Catalina M.D.   On: 05/10/2018 07:47    Procedures Procedures (including critical care time)  Medications Ordered in ED Medications  metroNIDAZOLE (FLAGYL) IVPB 500 mg (500 mg Intravenous New Bag/Given 05/10/18 0935)  vancomycin (VANCOCIN) IVPB 1000 mg/200 mL premix (has no administration in time range)  acetaminophen (TYLENOL) suppository 650 mg (650 mg Rectal Given by Other 05/10/18 0720)  sodium chloride flush (NS) 0.9 % injection 3 mL (3 mLs Intravenous Given by Other 05/10/18 0839)  ceFEPIme (MAXIPIME) 2 g in sodium chloride 0.9 % 100 mL IVPB (0 g Intravenous Stopped 05/10/18 0933)  lactated ringers bolus 1,000 mL (1,000 mLs Intravenous Bolus from Bag 05/10/18 0920)  lactated ringers bolus 1,000 mL (1,000 mLs Intravenous Bolus from Bag 05/10/18 0920)     Initial Impression / Assessment and Plan / ED Course  I have reviewed the triage vital signs and the nursing notes.  Pertinent labs & imaging results that were available during my care of the patient were reviewed by me and considered in my medical decision making (see chart for details).       Patient's daughter is now here.  Patient had a temperature of 102 last night.  And had a possible cough alone she coughed a couple times.  Patient stays at home due to right lower leg fracture.  Had not been out.  Has only to patient care assistance it, neither of them are sick.  Patient's daughter is not ill, but does work at the [employer].  Patient also had not had surgery on the right lower leg.  Just had been immobilized.  Patient presents with fever mental status change.  Has had a rare cough but overall has not had much.  Initial lactic acid only mildly elevated.  Unable to  get urine from patient.  In and out cath showed no urine and bladder scan showed no urine.  Creatinine increased here.  Blood pressures improved with IV fluids.  Antibiotics given but does not have a clear source at this time.  Patient stays at home and only has caregivers come in.  Far as family knows they have not been sick.  Will admit to hospitalist for further evaluation and treatment.  Has had recent immobilization of right lower extremity, however there is no swelling.  I think with a fever of 103 pulmonary embolism is less likely the cause of the fever.  Patient had previous dental infections but no swelling in mouth at this time.  CRITICAL CARE Performed by: Davonna Belling Total critical care time: 30 minutes Critical care time was exclusive of separately billable procedures and treating other patients. Critical care was necessary to treat or prevent imminent or life-threatening deterioration. Critical care was time spent personally by me on the following activities: development of treatment plan with patient and/or surrogate as well as nursing, discussions with consultants, evaluation of patient's response to treatment, examination of patient, obtaining history from patient or surrogate, ordering and performing treatments and interventions, ordering and review of laboratory studies, ordering and review of radiographic studies, pulse oximetry and re-evaluation of patient's condition.   Final Clinical Impressions(s) /  ED Diagnoses   Final diagnoses:  Fever, unspecified fever cause    ED Discharge Orders    None       Davonna Belling, MD 05/10/18 0903    Davonna Belling, MD 05/10/18 1000

## 2018-05-10 NOTE — H&P (Addendum)
History and Physical   [name]:[id number] DOB: 1940/06/18 DOA: 05/10/2018  Referring MD/NP/PA: Dr. Alvino Chapel, [place] PCP: Michelle Battles, MD  Patient coming from: Home  Chief Complaint: Fever, AMS  HPI: [name] is a 78 y.o. female with a history of dementia, aspiration pneumonia, UTI, HTN, and T2DM no longer on insulin, and periprosthetic right knee fracture who is bedbound in knee immobilizer. She presented with fever and lethargy as reported by her daughter as well as decreased po intake for the past 4-5 days. She noted a "cough" to EDP though specifies that it is more of a weak throat clearing, and is not productive. History not obtainable by patient due to encephalopathy. She is bedbound, has 2 caretakers who have no symptoms of illness and are not, themselves, exposed to any ill contacts and have not traveled outside the county in the previous 14 days.   Initially she was not meaningfully responsive, poor gag reflex with fever, tachycardia, tachypnea and borderline hypotension. WBC 12.5. There are multiple metabolic derangements including lactic acid elevation, very mild acidosis (bicarb 21), hyperglycemia to 568, without elevated anion gap, hypernatremia (corrects to 163), and creatinine and BUN up to 2.21, 75 respectively. Potassium is 4.5. CXR demonstrated a calcified granuloma without acute findings. Bladder scan showed no urine, so urinalysis remains pending.   Mentation has improved with hydration, antibiotics. She is more responsive, maintaining airway, and stable for a step down unit disposition, admitted for sepsis with unclear etiology, though urinary source is suspected.  Review of Systems: UTO due to patient's confusion. As much as was possible, all systems not mentioned in HPI were reviewed with her daughter and are negative.   Past Medical History:  Diagnosis Date  . Anxiety   . Depression   . Diabetes mellitus   . Dyspnea   . GERD (gastroesophageal  reflux disease)   . Headache(784.0)   . HTN (hypertension)    dr Johnsie Cancel  . Hypercholesteremia    DENIES  . Osteoarthritis   . PONV (postoperative nausea and vomiting)   . RLS (restless legs syndrome)   . Stress    Past Surgical History:  Procedure Laterality Date  . ABDOMINAL HYSTERECTOMY    . COLONOSCOPY    . JOINT REPLACEMENT    . left forearm fracture with ORIF Left   . LUMBAR LAMINECTOMY/DECOMPRESSION MICRODISCECTOMY N/A 04/18/2012   Procedure: LUMBAR LAMINECTOMY/DECOMPRESSION MICRODISCECTOMY 3 LEVELS;  Surgeon: Winfield Cunas, MD;  Location: [facility];  Service: Neurosurgery;  Laterality: N/A;  Lumbar three-four, lumbar four-five, lumbar five-sacral one decompression. Synovial cyst resection lumbar four-five  . ORIF PERIPROSTHETIC FRACTURE Right 07/06/2016   Procedure: OPEN REDUCTION INTERNAL FIXATION (ORIF) PERIPROSTHETIC FRACTURE;  Surgeon: Gaynelle Arabian, MD;  Location: [facility];  Service: Orthopedics;  Laterality: Right;  . REPLACEMENT TOTAL KNEE Right   . WISDOM TOOTH EXTRACTION     - Nonsmoker, lives at home with caretakers.   reports that she has never smoked. She has never used smokeless tobacco. She reports that she does not drink alcohol or use drugs. Allergies  Allergen Reactions  . Flounder [Fish Allergy] Other (See Comments)    Pt breaks out in bumps  . Morphine And Related Other (See Comments)    Reaction:  Hallucinations   . Oxycodone Other (See Comments)    Reaction:  Hallucinations  . Sulfonamide Derivatives Swelling and Other (See Comments)    Reaction:  Unspecified swelling reaction    Family History  Problem Relation Age of Onset  .  Diabetes Father   . High blood pressure Father   . High blood pressure Mother   . Colon cancer Neg Hx   . Esophageal cancer Neg Hx   . Liver disease Neg Hx   . Stomach cancer Neg Hx   . Pancreatic cancer Neg Hx    - Family history otherwise reviewed and not pertinent.  Prior to Admission medications   Medication  Sig Start Date End Date Taking? Authorizing Provider  acetaminophen (TYLENOL) 500 MG tablet Take 500 mg by mouth every 8 (eight) hours as needed for mild pain.   Yes [provider]  amLODipine (NORVASC) 5 MG tablet Take 1 tablet (5 mg total) by mouth daily. 09/12/16  Yes Dessa Phi, DO  ALPRAZolam Duanne Moron) 0.25 MG tablet Take 1 tablet (0.25 mg total) by mouth at bedtime as needed for anxiety. 09/12/16   Dessa Phi, DO  bisacodyl (DULCOLAX) 10 MG suppository Place 1 suppository (10 mg total) rectally daily as needed for moderate constipation. 07/08/16   Perkins, Alexzandrew L, PA-C  divalproex (DEPAKOTE SPRINKLE) 125 MG capsule Take 125 mg by mouth daily. 02/08/18   [provider]  HYDROcodone-acetaminophen (NORCO/VICODIN) 5-325 MG tablet Take 1-2 tablets by mouth every 6 (six) hours as needed for moderate pain or severe pain. 03/09/18   Edmisten, Ok Anis, PA  Infant Care Products (DERMACLOUD) CREA Apply 1 application topically as needed (each change).    [provider]  insulin aspart (NOVOLOG) 100 UNIT/ML injection Inject 2-11 Units into the skin 3 (three) times daily before meals. Pt uses per sliding scale:    101-150:  2 units 151-200:  3 units  201-250:  5 units  251-300:  7 units  301-350:  9 units  Greater than 350:  11 units Greater than 400 or less than 60:  Call MD    [provider]  Insulin Detemir (LEVEMIR FLEXTOUCH) 100 UNIT/ML Pen Inject 18 Units into the skin 2 (two) times daily. Pt uses 12 units in the morning and 8 units at bedtime.    [provider]  loratadine-pseudoephedrine (CLARITIN-D 12-HOUR) 5-120 MG tablet Take 1 tablet by mouth 2 (two) times daily as needed for allergies.    [provider]  losartan (COZAAR) 25 MG tablet Take 100 mg by mouth daily.    [provider]  Magnesium Oxide 500 MG TABS Take 500 mg by mouth 2 (two) times daily.     [provider]  metoprolol succinate (TOPROL-XL)  50 MG 24 hr tablet Take 1 tablet (50 mg total) by mouth daily. 07/04/14   Robbie Lis, MD  mirtazapine (REMERON) 15 MG tablet Take 15 mg by mouth at bedtime. 02/27/18   [provider]  omeprazole (PRILOSEC) 20 MG capsule Take 20 mg by mouth daily as needed (acid reflux).    [provider]  ondansetron (ZOFRAN) 4 MG tablet Take 4 mg by mouth every 6 (six) hours as needed for nausea/vomiting. 02/13/18   [provider]  pramipexole (MIRAPEX) 1.5 MG tablet Take 1.5 mg by mouth 3 (three) times daily.    [provider]    Physical Exam: Vitals:   05/10/18 0945 05/10/18 1047 05/10/18 1145 05/10/18 1215  BP: 103/61 115/72 140/67 124/61  Pulse: 79 81    Resp: (!) 31 (!) 24 (!) 24 (!) 34  Temp:      TempSrc:      SpO2: 98% 99%  98%   Constitutional: Lethargic but rousable, frail elderly female  not in acute distress Eyes: Lids and conjunctivae normal, pupils are equal and reactive, +arcus senilis. ENMT: Mucous membranes are dry. Posterior pharynx clear of any exudate or lesions. Poor dentition.  Neck: Normal, supple, no masses, no thyromegaly Respiratory: Non-labored tachypnea without accessory muscle use. Unable to perform posterior exam. Laterally, breath sounds clear bilaterally Cardiovascular: Regular rate and rhythm, no murmurs, rubs, or gallops. No carotid bruits. No JVD. no significant LE edema. Palpable pedal pulses. Abdomen: Normoactive bowel sounds. No tenderness/graimace elicited, non-distended, and no masses palpated. No hepatosplenomegaly. GU: No indwelling catheter Musculoskeletal: No clubbing / cyanosis. No joint deformity upper and lower extremities, though right knee is in immobilizer at full extension.  Skin: Warm, dry. No rashes, wounds, or ulcers.  Neurologic: Patient not completely cooperative with exam. Will not follow commands reliably but opens eyes and tracks to voice and touch, can phonate. Noted to be clearing throat spontaneously.  Moved UE's spontaneously, otherwise unable to assess. Intact patellar reflexes and no clonus.  Psychiatric: UTD  Labs on Admission: I have personally reviewed following labs and imaging studies  CBC: Recent Labs  Lab 05/10/18 0715  WBC 12.5*  NEUTROABS 10.7*  HGB 13.5  HCT 47.2*  MCV 90.1  PLT 785   Basic Metabolic Panel: Recent Labs  Lab 05/10/18 0715  NA 156*  K 4.5  CL 120*  CO2 21*  GLUCOSE 568*  BUN 75*  CREATININE 2.21*  CALCIUM 9.7   GFR: CrCl cannot be calculated (Unknown ideal weight.). Liver Function Tests: Recent Labs  Lab 05/10/18 0715  AST 13*  ALT 18  ALKPHOS 75  BILITOT 1.1  PROT 8.2*  ALBUMIN 4.1   No results for input(s): LIPASE, AMYLASE in the last 168 hours. Recent Labs  Lab 05/10/18 0951  AMMONIA 10   Coagulation Profile: Recent Labs  Lab 05/10/18 0715  INR 1.3*   Cardiac Enzymes: No results for input(s): CKTOTAL, CKMB, CKMBINDEX, TROPONINI in the last 168 hours. BNP (last 3 results) No results for input(s): PROBNP in the last 8760 hours. HbA1C: No results for input(s): HGBA1C in the last 72 hours. CBG: Recent Labs  Lab 05/10/18 0807  GLUCAP 466*   Lipid Profile: No results for input(s): CHOL, HDL, LDLCALC, TRIG, CHOLHDL, LDLDIRECT in the last 72 hours. Thyroid Function Tests: No results for input(s): TSH, T4TOTAL, FREET4, T3FREE, THYROIDAB in the last 72 hours. Anemia Panel: No results for input(s): VITAMINB12, FOLATE, FERRITIN, TIBC, IRON, RETICCTPCT in the last 72 hours. Urine analysis:    Component Value Date/Time   COLORURINE YELLOW 05/10/2018 1215   APPEARANCEUR CLOUDY (A) 05/10/2018 1215   LABSPEC 1.017 05/10/2018 1215   PHURINE 5.0 05/10/2018 1215   GLUCOSEU >=500 (A) 05/10/2018 1215   HGBUR MODERATE (A) 05/10/2018 1215   BILIRUBINUR NEGATIVE 05/10/2018 1215   KETONESUR 5 (A) 05/10/2018 1215   PROTEINUR NEGATIVE 05/10/2018 1215   UROBILINOGEN 0.2 07/20/2014 2228   NITRITE NEGATIVE 05/10/2018 1215    LEUKOCYTESUR LARGE (A) 05/10/2018 1215    No results found for this or any previous visit (from the past 240 hour(s)).   Radiological Exams on Admission: Dg Chest Portable 1 View  Result Date: 05/10/2018 CLINICAL DATA:  Fevers and cough EXAM: PORTABLE CHEST 1 VIEW COMPARISON:  09/17/2016 FINDINGS: Cardiac shadows within normal limits. The lungs are well aerated bilaterally. No focal infiltrate or sizable effusion is seen. No acute bony abnormality is noted. Calcified granuloma is noted in the right mid lung. IMPRESSION: No acute abnormality noted. Electronically Signed  By: Inez Catalina M.D.   On: 05/10/2018 07:47    EKG: Independently reviewed. Sinus arrhytmia, no ischemic changes, QTc 544msec.  Assessment/Plan Principal Problem:   Sepsis ([place]) Active Problems:   Essential hypertension   Acute encephalopathy   Lactic acidosis   Dementia (HCC)   Hypernatremia   DM type 2 (diabetes mellitus, type 2) (HCC)   Dehydration   Sepsis due to presumed urinary source:  - Continue broad antibiotics (vancomycin, cefepime, flagyl) due to unclear source, history of UTI and aspiration PNA. Consider SLP once more alert. If urine unremarkable, would consider work up for dental abscess due to history of same but exam not significant for this.  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  - Given 2L LR, will continue IVF resuscitation, though wish to not correct sodium too rapidly. - Discussed with ID, Dr. Johnnye Sima, regarding covid testing/isolation which is NOT recommended as the patient is bedbound with no exposure history. Flu negative.  ADDENDUM: Will transition to ceftriaxone monotherapy with urinalysis showing significant pyuria. Has hx pan-sensitive E. coli June 2018. Urine culture pending.  Acute renal failure: With mild acidosis, no hyperkalemia.  - Recheck after IVF's - Check renal U/S - Urine Na -  Insert foley catheter to closely follow UOP and r/o obstruction.  - Hold ARB and metformin.  Acute metabolic encephalopathy on chronic dementia: Multifactorial in setting of sepsis, dehydration, hypernatremia, hyperglycemia, elevated BUN. Ammonia normal. - Improving already with treatment. Plan to keep NPO until more alert, with plans to restart medications slowly (depakote, remeron, mirapex, vicodin, xanax) as polypharmacy is likely contributing in setting of renal impairment.  - Patient not cooperative with full exam at this time, consider neuroimaging if persistently altered after improvement in etiologies listed above. No hx CVA.   Hypernatremia: Due to free water deficit from osmotic diuresis and inadequate po replacement (due to AMS) and renal failure - Treat with volume replacement initially and then based on trend. Monitor BMP closely and avoid correction more than 17mEq/L over 24 hours.   Hyperglycemia in uncontrolled T2DM: Due to stress in the setting of no longer taking insulin (?no one to administer this at home) - Give novolog 10 u now, then SSI q4h. Discussed with RN. Addendum: Without elevated anion gap despite lactic acid elevation and bicarbonate of 21 I do not think an insulin infusion is necessary at this time, though will continue monitoring. - Monitor K closely, expecting downward trend but will not replace empirically now due to renal failure.   Lactic acidosis:  - Stop metformin, trend, and treat as otherwise stated.   HTN:  - Hold antihypertensives (norvasc, metoprolol, losartan) in setting of sepsis.   QT interval prolongation:  - Monitor on telemetry, avoid provocative agents as able.  DVT prophylaxis: Heparin  Code Status: Full code confirmed with daughter who is HCPOA at admission  Family Communication: Daughter Disposition Plan: Uncertain Consults called: None  Admission status: Inpatient. This patient can reasonably be expected to continue requiring inpatient  services across 2+ midnights.    Patrecia Pour, MD [facility] [email or web address] Password [email or web address] 05/10/2018, 1:21 PM

## 2018-05-10 NOTE — ED Notes (Signed)
Bladder scan 37mL at this time.

## 2018-05-10 NOTE — ED Triage Notes (Addendum)
Per EMS , pt. From home with complaint of fever w/ temp of 103."F. also reported of cough that started yesterday. Family also reported to EMS of altered mental status which started last night. Per EMS ,pt.s temp. 106 F'. Non verbal.with bil rhonchi [place] .

## 2018-05-11 ENCOUNTER — Other Ambulatory Visit: Payer: Self-pay

## 2018-05-11 DIAGNOSIS — F0280 Dementia in other diseases classified elsewhere without behavioral disturbance: Secondary | ICD-10-CM

## 2018-05-11 DIAGNOSIS — L89151 Pressure ulcer of sacral region, stage 1: Secondary | ICD-10-CM

## 2018-05-11 DIAGNOSIS — L8990 Pressure ulcer of unspecified site, unspecified stage: Secondary | ICD-10-CM

## 2018-05-11 LAB — GLUCOSE, CAPILLARY
GLUCOSE-CAPILLARY: 124 mg/dL — AB (ref 70–99)
Glucose-Capillary: 104 mg/dL — ABNORMAL HIGH (ref 70–99)
Glucose-Capillary: 106 mg/dL — ABNORMAL HIGH (ref 70–99)
Glucose-Capillary: 110 mg/dL — ABNORMAL HIGH (ref 70–99)
Glucose-Capillary: 110 mg/dL — ABNORMAL HIGH (ref 70–99)
Glucose-Capillary: 121 mg/dL — ABNORMAL HIGH (ref 70–99)
Glucose-Capillary: 134 mg/dL — ABNORMAL HIGH (ref 70–99)
Glucose-Capillary: 139 mg/dL — ABNORMAL HIGH (ref 70–99)
Glucose-Capillary: 141 mg/dL — ABNORMAL HIGH (ref 70–99)
Glucose-Capillary: 143 mg/dL — ABNORMAL HIGH (ref 70–99)
Glucose-Capillary: 147 mg/dL — ABNORMAL HIGH (ref 70–99)
Glucose-Capillary: 173 mg/dL — ABNORMAL HIGH (ref 70–99)
Glucose-Capillary: 177 mg/dL — ABNORMAL HIGH (ref 70–99)
Glucose-Capillary: 178 mg/dL — ABNORMAL HIGH (ref 70–99)
Glucose-Capillary: 186 mg/dL — ABNORMAL HIGH (ref 70–99)

## 2018-05-11 LAB — URINE CULTURE: Culture: 40000 — AB

## 2018-05-11 LAB — BASIC METABOLIC PANEL
Anion gap: 8 (ref 5–15)
Anion gap: 9 (ref 5–15)
BUN: 68 mg/dL — ABNORMAL HIGH (ref 8–23)
BUN: 74 mg/dL — ABNORMAL HIGH (ref 8–23)
CALCIUM: 9 mg/dL (ref 8.9–10.3)
CO2: 22 mmol/L (ref 22–32)
CO2: 23 mmol/L (ref 22–32)
Calcium: 9.2 mg/dL (ref 8.9–10.3)
Chloride: 125 mmol/L — ABNORMAL HIGH (ref 98–111)
Chloride: 125 mmol/L — ABNORMAL HIGH (ref 98–111)
Creatinine, Ser: 1.9 mg/dL — ABNORMAL HIGH (ref 0.44–1.00)
Creatinine, Ser: 2.15 mg/dL — ABNORMAL HIGH (ref 0.44–1.00)
GFR calc Af Amer: 25 mL/min — ABNORMAL LOW (ref >60)
GFR calc Af Amer: 29 mL/min — ABNORMAL LOW (ref >60)
GFR calc non Af Amer: 21 mL/min — ABNORMAL LOW (ref >60)
GFR calc non Af Amer: 25 mL/min — ABNORMAL LOW (ref >60)
GLUCOSE: 126 mg/dL — AB (ref 70–99)
GLUCOSE: 230 mg/dL — AB (ref 70–99)
Potassium: 3.2 mmol/L — ABNORMAL LOW (ref 3.5–5.1)
Potassium: 3.3 mmol/L — ABNORMAL LOW (ref 3.5–5.1)
Sodium: 156 mmol/L — ABNORMAL HIGH (ref 135–145)
Sodium: 156 mmol/L — ABNORMAL HIGH (ref 135–145)

## 2018-05-11 LAB — CBC
HCT: 41.5 % (ref 36.0–46.0)
Hemoglobin: 11.2 g/dL — ABNORMAL LOW (ref 12.0–15.0)
MCH: 25.4 pg — AB (ref 26.0–34.0)
MCHC: 27 g/dL — AB (ref 30.0–36.0)
MCV: 94.1 fL (ref 80.0–100.0)
Platelets: 121 10*3/uL — ABNORMAL LOW (ref 150–400)
RBC: 4.41 MIL/uL (ref 3.87–5.11)
RDW: 15.9 % — ABNORMAL HIGH (ref 11.5–15.5)
WBC: 12.5 10*3/uL — ABNORMAL HIGH (ref 4.0–10.5)
nRBC: 0 % (ref 0.0–0.2)

## 2018-05-11 LAB — MRSA PCR SCREENING: MRSA by PCR: POSITIVE — AB

## 2018-05-11 MED ORDER — PHENYLEPHRINE HCL-NACL 10-0.9 MG/250ML-% IV SOLN
INTRAVENOUS | Status: AC
  Administered 2018-05-11: 25 ug/min via INTRAVENOUS
  Filled 2018-05-11: qty 250

## 2018-05-11 MED ORDER — INSULIN ASPART 100 UNIT/ML ~~LOC~~ SOLN: 0.0000 [IU] | Freq: Every day | SUBCUTANEOUS | Status: DC

## 2018-05-11 MED ORDER — ORAL CARE MOUTH RINSE
15.0000 mL | Freq: Two times a day (BID) | OROMUCOSAL | Status: DC
  Administered 2018-05-11 – 2018-05-14 (×4): 15 mL via OROMUCOSAL

## 2018-05-11 MED ORDER — INSULIN DETEMIR 100 UNIT/ML ~~LOC~~ SOLN
9.0000 [IU] | Freq: Two times a day (BID) | SUBCUTANEOUS | Status: DC
  Administered 2018-05-11 – 2018-05-14 (×7): 9 [IU] via SUBCUTANEOUS
  Filled 2018-05-11 (×8): qty 0.09

## 2018-05-11 MED ORDER — SODIUM CHLORIDE 0.9 % IV SOLN
250.0000 mL | INTRAVENOUS | Status: DC
  Administered 2018-05-11: 250 mL via INTRAVENOUS

## 2018-05-11 MED ORDER — INSULIN ASPART 100 UNIT/ML ~~LOC~~ SOLN
0.0000 [IU] | Freq: Three times a day (TID) | SUBCUTANEOUS | Status: DC
  Administered 2018-05-11: 2 [IU] via SUBCUTANEOUS
  Administered 2018-05-11: 1 [IU] via SUBCUTANEOUS
  Administered 2018-05-13: 2 [IU] via SUBCUTANEOUS
  Administered 2018-05-13 – 2018-05-14 (×2): 1 [IU] via SUBCUTANEOUS

## 2018-05-11 MED ORDER — FLUCONAZOLE 100MG IVPB
100.0000 mg | INTRAVENOUS | Status: DC
  Administered 2018-05-11 – 2018-05-13 (×3): 100 mg via INTRAVENOUS
  Filled 2018-05-11 (×3): qty 50

## 2018-05-11 MED ORDER — CHLORHEXIDINE GLUCONATE 0.12 % MT SOLN
15.0000 mL | Freq: Two times a day (BID) | OROMUCOSAL | Status: DC
  Administered 2018-05-11 – 2018-05-14 (×7): 15 mL via OROMUCOSAL
  Filled 2018-05-11 (×6): qty 15

## 2018-05-11 MED ORDER — INSULIN ASPART 100 UNIT/ML ~~LOC~~ SOLN
3.0000 [IU] | Freq: Three times a day (TID) | SUBCUTANEOUS | Status: DC
  Administered 2018-05-11 – 2018-05-14 (×6): 3 [IU] via SUBCUTANEOUS

## 2018-05-11 MED ORDER — ASPIRIN EC 325 MG PO TBEC
325.0000 mg | DELAYED_RELEASE_TABLET | Freq: Every day | ORAL | Status: DC
  Administered 2018-05-11 – 2018-05-14 (×4): 325 mg via ORAL
  Filled 2018-05-11 (×4): qty 1

## 2018-05-11 MED ORDER — POTASSIUM CHLORIDE 10 MEQ/100ML IV SOLN
10.0000 meq | INTRAVENOUS | Status: AC
  Administered 2018-05-11 (×2): 10 meq via INTRAVENOUS
  Filled 2018-05-11 (×2): qty 100

## 2018-05-11 MED ORDER — LACTATED RINGERS IV BOLUS
1000.0000 mL | Freq: Once | INTRAVENOUS | Status: AC
  Administered 2018-05-11: 1000 mL via INTRAVENOUS

## 2018-05-11 MED ORDER — PHENYLEPHRINE HCL-NACL 10-0.9 MG/250ML-% IV SOLN
25.0000 ug/min | INTRAVENOUS | Status: DC
  Administered 2018-05-11: 25 ug/min via INTRAVENOUS

## 2018-05-11 NOTE — Progress Notes (Addendum)
Notified MD A. Swayze of Inpatient Diabetes RN recommendations for transition of IV insulin drip at 1200, no new orders for long acting insulin, followed order transition with SSI coverage upon discontinuation of IV insulin gtt.  Notified MD of AM K of 3.2 at this time.   Burnice Logan, RN

## 2018-05-11 NOTE — Progress Notes (Signed)
Inpatient Diabetes Program Recommendations  AACE/ADA: New Consensus Statement on Inpatient Glycemic Control (2015)  Target Ranges:  Prepandial:   less than 140 mg/dL      Peak postprandial:   less than 180 mg/dL (1-2 hours)      Critically ill patients:  140 - 180 mg/dL   Lab Results  Component Value Date   GLUCAP 147 (H) 05/11/2018   HGBA1C 10.8 (H) 09/11/2016    Review of Glycemic Control  Diabetes history: DM2 Outpatient Diabetes medications: D/C on 03/09/18 listed Levemir 18 units bid (patient had been taking 12 am + 8 pm) + Novolog 2-11 unit ac meals tid Current orders for Inpatient glycemic control: IV insulin drip  Inpatient Diabetes Program Recommendations:   When transitioning from IV insulin drip, please consider  -Levemir 9 units bid (give first dose 2 hrs prior to D/C of insulin drip and cover CBG 2 hrs after IV insulin discontinued -Novolog sensitive correction tid -Novolog 3 units tid meal coverage if eats 50%  Thank you, Bethena Roys E. Destinee Taber, RN, MSN, CDE  Diabetes Coordinator Inpatient Glycemic Control Team Team Pager [phone] (8am-5pm) 05/11/2018 10:55 AM

## 2018-05-11 NOTE — Progress Notes (Addendum)
PROGRESS NOTE  [name] YPP:[id number] DOB: 03-14-1940 DOA: 05/10/2018 PCP: [name], MD  Brief History   The patient is a 78 yr old woman who lives at home. She was brought to the ED by her daughter who states that the patient has had reduced PO intake for the past 4-5 days. She states that prior to presentation the patient was lethargic and that she had had a fever. The patient's daughter told ED doctor that her mother had had a cough, it seems like it was more of a throat clearing. The patient is non-ambulatory. She depends upon 2 caretakers who have reported no cough or fever. She has not had exposure to any sick contacts or travelled in the previous 14 days.  In the ED the patient was found to be fairly lethargic, with lactic acidosis, mild metabolic acidosis, hyperglycemia without anion gap elevation, hypernatremia, as well as elevated creatinine and BUN. CXR demonstrated no acute cardiopulmonary abnormalities. There was no urine in her bladder.  The patient was admitted to the ICU and required pressor support for a time due to hypotension. Ultimately she was found to have a UTI. She was treated with IV Rocephin and IV antibiotics. Urine and blood cultures were obtained. Urin eculture has grown out 40K cfu of yeast. No bacterial growth as of yet. Blood cultures have had no growth. She is positive for MRSA by nasopharyngeal swab.  She was transferred out of the ICU today.  Consultants  . None  Procedures  . None  Antibiotics  . Rocephin 1 gm IV x 1 dose . Started on Diflucan 100 mg IV daily.  Interval History/Subjective  See above  Objective   Vitals:  Vitals:   05/11/18 1530 05/11/18 1600  BP: 127/62 124/61  Pulse: 84 69  Resp: (!) 27 (!) 22  Temp:    SpO2: 97% 98%    Exam:  Constitutional:  . The patient is somnolent, but easily arouseable. She is confused. No acute distress. Respiratory:  . No increased work of breathing.  . No wheezes, rales, or  rhonchi . No tactile fremitus Cardiovascular:  . Regular rate and rhythm. . No murmur, ectopy, or gallups are appreciated. . No lateral PMI. No thrills. . Diminished pedal pulses Abdomen:  . Abdomen soft, non-tender, non-distended. . No hernias, masses, or organomegaly. . Normoactive bowel sounds. Musculoskeletal:  . No cyanosis, clubbing or edema Skin:  . No rashes, lesions . Stage II sacral decubitus ulcer . palpation of skin: no induration or nodules Neurologic:  . CN 2-12 intact . Moving all extremities   I have personally reviewed the following:   Today's Data  . BMP, CBC, Urine culture, Blood cultures,    Scheduled Meds: . chlorhexidine  15 mL Mouth Rinse BID  . heparin  5,000 Units Subcutaneous Q8H  . insulin aspart  0-9 Units Subcutaneous TID WC  . insulin aspart  3 Units Subcutaneous TID WC  . insulin detemir  9 Units Subcutaneous BID  . mouth rinse  15 mL Mouth Rinse q12n4p   Continuous Infusions: . sodium chloride Stopped (05/10/18 2102)  . sodium chloride 100 mL/hr at 05/10/18 2005  . sodium chloride 10 mL/hr at 05/11/18 1600  . cefTRIAXone (ROCEPHIN)  IV Stopped (05/11/18 1432)  . dextrose 5 % and 0.9% NaCl Stopped (05/11/18 1239)  . phenylephrine (NEO-SYNEPHRINE) Adult infusion Stopped (05/11/18 1028)    Principal Problem:   Sepsis ([place]) Active Problems:   Essential hypertension   Acute encephalopathy  Lactic acidosis   Dementia (HCC)   Hypernatremia   DM type 2 (diabetes mellitus, type 2) (HCC)   Dehydration   Pressure injury of skin   Hypokalemia  A & P   Sepsis: Resolved. Ostendibly due to fungal UTI. Rocephin stopped and diflucan started. Patient has been transferred out of the ICU as she is hemodynamically stable. Lactic acid 1.7 on admission.  Fungal UTI: Urine culture has grown non bacteria, but 50% CFU of yeast. Diflucan started. Continue to monitor cultures. Rocephin stopped.  Lactic acidosis: 1.7 on admission. Will recheck   Hypernatremia: continue IV fluids cautiously. Monitor electrolytes.  Hypokalemia: Supplement and monitor.  DM II: FSBS 141 - 177 over the course of the last 24 hours. The patient is receiving Detemir 9 units bid and 3 units of novolog with meals tid. She is also on a sensitive sliding scale. I appreciate the diabetic coordinator's help.  Dementia: Noted: Continue  Hypertension: Norvasc. Losartan, and metoprolol are held. The patient is currently normotensive.  Stage 2 sacral decubitus ulcer: POA. As per wound care. The patient is bed bound.  I have seen and examined this patient myself. I have spent 38 minutes in her evaluation and care.  DVT prophylaxis: Heparin Code Status:Full Code Family Communication: None available Disposition Plan: Home  Kayleen Alig, DO [facility] Direct contact: see [email or web address]  7PM-7AM contact night coverage as above 05/11/2018, 5:12 PM  LOS: 1 day    LOS: 1 day

## 2018-05-12 LAB — CBC WITH DIFFERENTIAL/PLATELET
Abs Immature Granulocytes: 0.04 10*3/uL (ref 0.00–0.07)
Basophils Absolute: 0 10*3/uL (ref 0.0–0.1)
Basophils Relative: 0 %
Eosinophils Absolute: 0 10*3/uL (ref 0.0–0.5)
Eosinophils Relative: 0 %
HCT: 37 % (ref 36.0–46.0)
Hemoglobin: 10.1 g/dL — ABNORMAL LOW (ref 12.0–15.0)
Immature Granulocytes: 1 %
Lymphocytes Relative: 21 %
Lymphs Abs: 1.7 10*3/uL (ref 0.7–4.0)
MCH: 25.7 pg — ABNORMAL LOW (ref 26.0–34.0)
MCHC: 27.3 g/dL — ABNORMAL LOW (ref 30.0–36.0)
MCV: 94.1 fL (ref 80.0–100.0)
Monocytes Absolute: 0.5 10*3/uL (ref 0.1–1.0)
Monocytes Relative: 6 %
Neutro Abs: 5.9 10*3/uL (ref 1.7–7.7)
Neutrophils Relative %: 72 %
PLATELETS: 102 10*3/uL — AB (ref 150–400)
RBC: 3.93 MIL/uL (ref 3.87–5.11)
RDW: 15.5 % (ref 11.5–15.5)
WBC: 8.2 10*3/uL (ref 4.0–10.5)
nRBC: 0 % (ref 0.0–0.2)

## 2018-05-12 LAB — BASIC METABOLIC PANEL
Anion gap: 6 (ref 5–15)
BUN: 52 mg/dL — ABNORMAL HIGH (ref 8–23)
CO2: 20 mmol/L — ABNORMAL LOW (ref 22–32)
Calcium: 8.5 mg/dL — ABNORMAL LOW (ref 8.9–10.3)
Chloride: 123 mmol/L — ABNORMAL HIGH (ref 98–111)
Creatinine, Ser: 1.17 mg/dL — ABNORMAL HIGH (ref 0.44–1.00)
GFR calc Af Amer: 51 mL/min — ABNORMAL LOW (ref >60)
GFR, EST NON AFRICAN AMERICAN: 44 mL/min — AB (ref >60)
Glucose, Bld: 108 mg/dL — ABNORMAL HIGH (ref 70–99)
Potassium: 3.5 mmol/L (ref 3.5–5.1)
Sodium: 149 mmol/L — ABNORMAL HIGH (ref 135–145)

## 2018-05-12 LAB — GLUCOSE, CAPILLARY
GLUCOSE-CAPILLARY: 103 mg/dL — AB (ref 70–99)
Glucose-Capillary: 85 mg/dL (ref 70–99)
Glucose-Capillary: 77 mg/dL (ref 70–99)
Glucose-Capillary: 99 mg/dL (ref 70–99)

## 2018-05-12 MED ORDER — ALPRAZOLAM 0.25 MG PO TABS: 0.2500 mg | ORAL_TABLET | Freq: Every evening | ORAL | Status: DC | PRN

## 2018-05-12 MED ORDER — DIVALPROEX SODIUM 125 MG PO CSDR
125.0000 mg | DELAYED_RELEASE_CAPSULE | Freq: Every evening | ORAL | Status: DC
  Administered 2018-05-12 – 2018-05-13 (×2): 125 mg via ORAL
  Filled 2018-05-12 (×2): qty 1

## 2018-05-12 MED ORDER — MAGNESIUM OXIDE 400 (241.3 MG) MG PO TABS
400.0000 mg | ORAL_TABLET | Freq: Every day | ORAL | Status: DC
  Administered 2018-05-12 – 2018-05-14 (×3): 400 mg via ORAL
  Filled 2018-05-12 (×3): qty 1

## 2018-05-12 NOTE — Progress Notes (Addendum)
PROGRESS NOTE  [name] DJS:[id number] DOB: 08/20/1940 DOA: 05/10/2018 PCP: [name], MD  Brief History   The patient is a 78 yr old woman who lives at home. She was brought to the ED by her daughter who states that the patient has had reduced PO intake for the past 4-5 days. She states that prior to presentation the patient was lethargic and that she had had a fever. The patient's daughter told ED doctor that her mother had had a cough, it seems like it was more of a throat clearing. The patient is non-ambulatory. She depends upon 2 caretakers who have reported no cough or fever. She has not had exposure to any sick contacts or travelled in the previous 14 days.  In the ED the patient was found to be fairly lethargic, with lactic acidosis, mild metabolic acidosis, hyperglycemia without anion gap elevation, hypernatremia, as well as elevated creatinine and BUN. CXR demonstrated no acute cardiopulmonary abnormalities. There was no urine in her bladder.  The patient was admitted to the ICU and required pressor support for a time due to hypotension. Ultimately she was found to have a UTI. She was treated with IV Rocephin and IV antibiotics. Urine and blood cultures were obtained. Urin eculture has grown out 40K cfu of yeast. No bacterial growth as of yet. Blood cultures have had no growth. She is positive for MRSA by nasopharyngeal swab.  She was transferred out of the ICU on 05/11/2018.  Consultants  . None  Procedures  . None  Antibiotics  . Rocephin 1 gm IV x 1 dose . Started on Diflucan 100 mg IV daily.  Interval History/Subjective  See above  Objective   Vitals:  Vitals:   05/12/18 1121 05/12/18 1200  BP: (!) 148/83 (!) 145/74  Pulse: (!) 59 62  Resp: 15 18  Temp:  98 F (36.7 C)  SpO2: 97% 97%    Exam:  Constitutional:  . The patient is awake, alert, and oriented x 3. No acute distress. Respiratory:  . No increased work of breathing.  . No wheezes,  rales, or rhonchi . No tactile fremitus Cardiovascular:  . Regular rate and rhythm. . No murmur, ectopy, or gallups are appreciated. . No lateral PMI. No thrills. . Diminished pedal pulses Abdomen:  . Abdomen soft, non-tender, non-distended. . No hernias, masses, or organomegaly. . Normoactive bowel sounds. Musculoskeletal:  . No cyanosis, clubbing or edema Skin:  . No rashes, lesions . Pt has a stage II sacral decubitus ulcer . palpation of skin: no induration or nodules Neurologic:  . CN 2-12 intact . Moving all extremities   I have personally reviewed the following:   Today's Data  . BMP, CBC, Urine culture, Blood cultures,    Scheduled Meds: . aspirin  325 mg Oral Daily  . chlorhexidine  15 mL Mouth Rinse BID  . heparin  5,000 Units Subcutaneous Q8H  . insulin aspart  0-9 Units Subcutaneous TID WC  . insulin aspart  3 Units Subcutaneous TID WC  . insulin detemir  9 Units Subcutaneous BID  . mouth rinse  15 mL Mouth Rinse q12n4p   Continuous Infusions: . sodium chloride 125 mL/hr at 05/12/18 1130  . sodium chloride 10 mL/hr at 05/11/18 1800  . fluconazole (DIFLUCAN) IV Stopped (05/11/18 1931)  . phenylephrine (NEO-SYNEPHRINE) Adult infusion Stopped (05/11/18 1028)    Principal Problem:   Sepsis ([place]) Active Problems:   Essential hypertension   Acute encephalopathy   Lactic acidosis  Dementia ([place])   Hypernatremia   DM type 2 (diabetes mellitus, type 2) ([facility])   Dehydration   Pressure injury of skin   Hypokalemia  A & P   Sepsis: Resolved. Ostendibly due to fungal UTI. Rocephin stopped and diflucan started. Patient has been transferred out of the ICU as she is hemodynamically stable. Lactic acid 1.7 on admission.  Fungal UTI: Urine culture has grown non bacteria, but 50% CFU of yeast. Diflucan started. Continue to monitor cultures. Rocephin stopped.  Lactic acidosis: Resolved.  Hypernatremia: Resolving.  Hypokalemia: Monitor.  DM II: FSBS 77 -  177 over the course of the last 24 hours. The patient is receiving Detemir 9 units bid and 3 units of novolog with meals tid. She is also on a sensitive sliding scale. I appreciate the diabetic coordinator's help. Due to low blood sugars today I will stop prandial novolog.  Dementia: Noted: Continue  Hypertension: Norvasc. Losartan, and metoprolol are held. The patient is currently normotensive.  Stage 2 sacral decubitus ulcer: POA. As per wound care. The patient is bed bound.  I have seen and examined this patient myself. I have spent 34 minutes in her evaluation and care.  DVT prophylaxis: Heparin Code Status:Full Code Family Communication: None available Disposition Plan: Home  Mya Suell, DO [facility] Direct contact: see [email or web address]  7PM-7AM contact night coverage as above 05/11/2018, 5:12 PM  LOS: 1 day    LOS: 2 days

## 2018-05-12 NOTE — Progress Notes (Signed)
Pt has arrived to Room 1520 with her daughter, from ICU. Pt alert and talkative. VS WNL Dressing change to sacral pressure injury Stage II. Daughter made aware of ulcer and care. (pt arrived to floor with Stage II pressure injury). Right knee immobilizer on. Pt remains in bed with floor mats in place.

## 2018-05-12 NOTE — Plan of Care (Signed)
  Problem: Clinical Measurements: Goal: Ability to maintain clinical measurements within normal limits will improve Outcome: Progressing Goal: Will remain free from infection Outcome: Progressing Goal: Diagnostic test results will improve Outcome: Progressing   Problem: Nutrition: Goal: Adequate nutrition will be maintained Outcome: Progressing   

## 2018-05-13 DIAGNOSIS — L89152 Pressure ulcer of sacral region, stage 2: Secondary | ICD-10-CM

## 2018-05-13 LAB — BASIC METABOLIC PANEL WITH GFR
Anion gap: 9 (ref 5–15)
BUN: 30 mg/dL — ABNORMAL HIGH (ref 8–23)
CO2: 18 mmol/L — ABNORMAL LOW (ref 22–32)
Calcium: 8.2 mg/dL — ABNORMAL LOW (ref 8.9–10.3)
Chloride: 121 mmol/L — ABNORMAL HIGH (ref 98–111)
Creatinine, Ser: 0.83 mg/dL (ref 0.44–1.00)
GFR calc Af Amer: >60 mL/min
GFR calc non Af Amer: >60 mL/min
Glucose, Bld: 116 mg/dL — ABNORMAL HIGH (ref 70–99)
Potassium: 3.2 mmol/L — ABNORMAL LOW (ref 3.5–5.1)
Sodium: 148 mmol/L — ABNORMAL HIGH (ref 135–145)

## 2018-05-13 LAB — CBC WITH DIFFERENTIAL/PLATELET
Abs Immature Granulocytes: 0.05 K/uL (ref 0.00–0.07)
Basophils Absolute: 0 K/uL (ref 0.0–0.1)
Basophils Relative: 0 %
Eosinophils Absolute: 0 K/uL (ref 0.0–0.5)
Eosinophils Relative: 0 %
HCT: 33.5 % — ABNORMAL LOW (ref 36.0–46.0)
Hemoglobin: 9.9 g/dL — ABNORMAL LOW (ref 12.0–15.0)
Immature Granulocytes: 1 %
Lymphocytes Relative: 18 %
Lymphs Abs: 1 K/uL (ref 0.7–4.0)
MCH: 26.4 pg (ref 26.0–34.0)
MCHC: 29.6 g/dL — ABNORMAL LOW (ref 30.0–36.0)
MCV: 89.3 fL (ref 80.0–100.0)
Monocytes Absolute: 0.4 K/uL (ref 0.1–1.0)
Monocytes Relative: 6 %
Neutro Abs: 4.2 K/uL (ref 1.7–7.7)
Neutrophils Relative %: 75 %
Platelets: 108 K/uL — ABNORMAL LOW (ref 150–400)
RBC: 3.75 MIL/uL — ABNORMAL LOW (ref 3.87–5.11)
RDW: 15.4 % (ref 11.5–15.5)
WBC: 5.6 K/uL (ref 4.0–10.5)
nRBC: 0 % (ref 0.0–0.2)

## 2018-05-13 LAB — GLUCOSE, CAPILLARY
GLUCOSE-CAPILLARY: 142 mg/dL — AB (ref 70–99)
Glucose-Capillary: 105 mg/dL — ABNORMAL HIGH (ref 70–99)
Glucose-Capillary: 164 mg/dL — ABNORMAL HIGH (ref 70–99)
Glucose-Capillary: 152 mg/dL — ABNORMAL HIGH (ref 70–99)

## 2018-05-13 MED ORDER — POTASSIUM CHLORIDE CRYS ER 20 MEQ PO TBCR
40.0000 meq | EXTENDED_RELEASE_TABLET | Freq: Once | ORAL | Status: AC
  Administered 2018-05-13: 40 meq via ORAL
  Filled 2018-05-13: qty 2

## 2018-05-13 NOTE — Progress Notes (Addendum)
PROGRESS NOTE  [name] SHF:[id number] DOB: [birth year] DOA: 05/10/2018 PCP: [name], MD  Brief History   The patient is a 78 yr old woman who lives at home. She was brought to the ED by her daughter who states that the patient has had reduced PO intake for the past 4-5 days. She states that prior to presentation the patient was lethargic and that she had had a fever. The patient's daughter told ED doctor that her mother had had a cough, it seems like it was more of a throat clearing. The patient is non-ambulatory. She depends upon 2 caretakers who have reported no cough or fever. She has not had exposure to any sick contacts or travelled in the previous 14 days.  In the ED the patient was found to be fairly lethargic, with lactic acidosis, mild metabolic acidosis, hyperglycemia without anion gap elevation, hypernatremia, as well as elevated creatinine and BUN. CXR demonstrated no acute cardiopulmonary abnormalities. There was no urine in her bladder.  The patient was admitted to the ICU and required pressor support for a time due to hypotension. Ultimately she was found to have a UTI. She was treated with IV Rocephin and IV antibiotics. Urine and blood cultures were obtained. Urin eculture has grown out 40K cfu of yeast. No bacterial growth as of yet. Blood cultures have had no growth. She is positive for MRSA by nasopharyngeal swab.  She was transferred out of the ICU on 05/11/2018.  Consultants  . None  Procedures  . None  Antibiotics  . Rocephin 1 gm IV x 1 dose . Started on Diflucan 100 mg IV daily.  Interval History/Subjective  See above  Objective   Vitals:  Vitals:   05/12/18 2019 05/13/18 0448  BP: 118/79 (!) 149/77  Pulse: 69 65  Resp: 15 15  Temp: 99 F (37.2 C) 99 F (37.2 C)  SpO2: 99% 99%    Exam:  Constitutional:  . The patient is awake, alert, and oriented x 3. No acute distress. Respiratory:  . No increased work of breathing.  . No  wheezes, rales, or rhonchi . No tactile fremitus Cardiovascular:  . Regular rate and rhythm. . No murmur, ectopy, or gallups are appreciated. . No lateral PMI. No thrills. . Diminished pedal pulses Abdomen:  . Abdomen soft, non-tender, non-distended. . No hernias, masses, or organomegaly. . Normoactive bowel sounds. Musculoskeletal:  . No cyanosis, clubbing or edema Skin:  . No rashes, lesions . Pt has stage II sacral decubitus ulcer . palpation of skin: no induration or nodules Neurologic:  . CN 2-12 intact . Moving all extremities   I have personally reviewed the following:   Today's Data  . BMP, CBC, Urine culture, Blood cultures,    Scheduled Meds: . aspirin  325 mg Oral Daily  . chlorhexidine  15 mL Mouth Rinse BID  . divalproex  125 mg Oral QPM  . heparin  5,000 Units Subcutaneous Q8H  . insulin aspart  0-9 Units Subcutaneous TID WC  . insulin aspart  3 Units Subcutaneous TID WC  . insulin detemir  9 Units Subcutaneous BID  . magnesium oxide  400 mg Oral Daily  . mouth rinse  15 mL Mouth Rinse q12n4p   Continuous Infusions: . sodium chloride 125 mL/hr at 05/13/18 1030  . sodium chloride 10 mL/hr at 05/11/18 1800  . fluconazole (DIFLUCAN) IV 50 mL/hr at 05/12/18 1738    Principal Problem:   Sepsis [facility]) Active Problems:   Essential hypertension  Acute encephalopathy   Lactic acidosis   Dementia (HCC)   Hypernatremia   DM type 2 (diabetes mellitus, type 2) (HCC)   Dehydration   Pressure injury of skin   Hypokalemia  A & P   Sepsis: Resolved. Ostendibly due to fungal UTI. Rocephin stopped and diflucan started. Patient has been transferred out of the ICU as she is hemodynamically stable. Lactic acid 1.7 on admission.  Fungal UTI: Urine culture has grown non bacteria, but 50% CFU of yeast. Diflucan started. Continue to monitor cultures. Rocephin stopped.  Lactic acidosis: Resolved.  Hypernatremia: Resolving.  Hypokalemia: Supplement and monitor.   DM II: FSBS 99 - 164 over the course of the last 24 hours. The patient is receiving Detemir 9 units bid and 3 units of novolog with meals tid. She is also on a sensitive sliding scale. I appreciate the diabetic coordinator's help. Due to low blood sugars today I will stop prandial novolog.  Dementia: Noted: Continue  Hypertension: Norvasc. Losartan, and metoprolol are held. The patient is currently normotensive.  Stage 2 sacral decubitus ulcer: POA. As per wound care. The patient is bed bound.  I have seen and examined this patient myself. I have spent 30 minutes in her evaluation and care.  DVT prophylaxis: Heparin Code Status:Full Code Family Communication: None available Disposition Plan: Home  Johnmatthew Solorio, DO [facility] Direct contact: see [email or web address]  7PM-7AM contact night coverage as above 05/11/2018, 5:12 PM  LOS: 1 day    LOS: 3 days

## 2018-05-14 LAB — GLUCOSE, CAPILLARY
GLUCOSE-CAPILLARY: 92 mg/dL (ref 70–99)
GLUCOSE-CAPILLARY: 148 mg/dL — AB (ref 70–99)

## 2018-05-14 MED ORDER — FLUCONAZOLE 100 MG PO TABS: 100.0000 mg | ORAL_TABLET | ORAL | 0 refills | Status: DC

## 2018-05-14 MED ORDER — FLUCONAZOLE 100 MG PO TABS
100.0000 mg | ORAL_TABLET | ORAL | Status: DC
  Filled 2018-05-14: qty 1

## 2018-05-14 MED ORDER — INSULIN DETEMIR 100 UNIT/ML ~~LOC~~ SOLN: 10.0000 [IU] | Freq: Two times a day (BID) | SUBCUTANEOUS | 11 refills | Status: DC

## 2018-05-14 NOTE — Care Management Important Message (Signed)
Important Message  Patient Details  Name: [name] MRN: [id number] Date of Birth: Dec 29, 1940   Medicare Important Message Given:  Yes    Kerin Salen 05/14/2018, 11:32 AMImportant Message  Patient Details  Name: [name] MRN: [id number] Date of Birth: [birth year]   Medicare Important Message Given:  Yes    Kerin Salen 05/14/2018, 11:32 AM

## 2018-05-14 NOTE — Progress Notes (Signed)
Spoke with Domenic Moras of [facility] team about stage 2 sacral ulcer.  Santiago Glad recommended the current treatment of foam allevyn dressings.  Daughter updated with treatment, says she has a supply at home.

## 2018-05-14 NOTE — Discharge Summary (Signed)
Physician Discharge Summary  [name] UMP:[id number] DOB: 03-08-1940 DOA: 05/10/2018  PCP: [name], MD  Admit date: 05/10/2018 Discharge date: 05/14/2018  Recommendations for Outpatient Follow-up:  Follow up with PCP in 7-10 days.    Discharge Diagnoses: Principal diagnosis is #1 1. UTI 2. AKI  3.  Sepsis 4.  Hypernatremia 5.  Hypokalemia 6.  DM II 7.  Dementia 8.  Stage II sacral decubitus ulcer  Discharge Condition: Fair Disposition:  SNF  Diet recommendation: Diabetic  Filed Weights   05/11/18 0900  Weight: 85.5 kg    History of present illness:   [name] is a 78 y.o. female with a history of dementia, aspiration pneumonia, UTI, HTN, and T2DM no longer on insulin, and periprosthetic right knee fracture who is bedbound in knee immobilizer. She presented with fever and lethargy as reported by her daughter as well as decreased po intake for the past 4-5 days. She noted a "cough" to EDP though specifies that it is more of a weak throat clearing, and is not productive. History not obtainable by patient due to encephalopathy. She is bedbound, has 2 caretakers who have no symptoms of illness and are not, themselves, exposed to any ill contacts and have not traveled outside the county in the previous 14 days.   Initially she was not meaningfully responsive, poor gag reflex with fever, tachycardia, tachypnea and borderline hypotension. WBC 12.5. There are multiple metabolic derangements including lactic acid elevation, very mild acidosis (bicarb 21), hyperglycemia to 568, without elevated anion gap, hypernatremia (corrects to 163), and creatinine and BUN up to 2.21, 75 respectively. Potassium is 4.5. CXR demonstrated a calcified granuloma without acute findings. Bladder scan showed no urine, so urinalysis remains pending.   Mentation has improved with hydration, antibiotics. She is more responsive, maintaining airway, and stable for a step down unit  disposition, admitted for sepsis with unclear etiology, though urinary source is suspected.  Hospital Course: The patient is a 78 yr old woman who lives at home. She was brought to the ED by her daughter who states that the patient has had reduced PO intake for the past 4-5 days. She states that prior to presentation the patient was lethargic and that she had had a fever. The patient's daughter told ED doctor that her mother had had a cough, it seems like it was more of a throat clearing. The patient is non-ambulatory. She depends upon 2 caretakers who have reported no cough or fever. She has not had exposure to any sick contacts or travelled in the previous 14 days.  In the ED the patient was found to be fairly lethargic, with lactic acidosis, mild metabolic acidosis, hyperglycemia without anion gap elevation, hypernatremia, as well as elevated creatinine and BUN. CXR demonstrated no acute cardiopulmonary abnormalities. There was no urine in her bladder.  The patient was admitted to the ICU and required pressor support for a time due to hypotension. Ultimately she was found to have a UTI. She was treated with IV Rocephin and IV antibiotics. Urine and blood cultures were obtained. Urin eculture has grown out 40K cfu of yeast. No bacterial growth as of yet. Blood cultures have had no growth. She is positive for MRSA by nasopharyngeal swab.  She was transferred out of the ICU on 05/11/2018.  The patient has been doing well. Her foley catheter has been removed. She has been converted to oral Diflucan.  She will be discharged to SNF today.  Today's assessment: S: The patient  is resting comfortably. No new complaints. O: Vitals:  Vitals:   05/13/18 2053 05/14/18 0532  BP: 140/80 (!) 178/90  Pulse: 91 93  Resp: 16 20  Temp: 99.8 F (37.7 C) 98.4 F (36.9 C)  SpO2: 96% 96%    Constitutional:  . The patient is awake, alert, and oriented x 3. No acute distress. Respiratory:  . No wheezes,  rales, or rhonchi. . No tactile fremitus . No increased work of breathing. Cardiovascular:  . Regular rate and rhythm. . No murmur, ectopy, or gallups. . No LE extremity edema   . Normal pedal pulses Abdomen:  . Abdomen is soft, non-tender, non-distended . No hernias, masses, or organomegaly are appreciated. . Normoactive bowel sounds.  Musculoskeletal:  . No cyanosis, clubbing, or edema Skin:  . No rashes, lesions . Fraser Din has a stage II sacral decubitus ulcer . palpation of skin: no induration or nodules Neurologic:  . CN 2-12 intact . Moving all extremities Psychiatric:  . judgement and insight appear normal . Mental status o Mood, affect appropriate o Orientation to person, place, time  Discharge Instructions  Discharge Instructions    Activity as tolerated - No restrictions   Complete by:  As directed    Activity as tolerated - No restrictions   Complete by:  As directed    Call MD for:  persistant nausea and vomiting   Complete by:  As directed    Call MD for:  severe uncontrolled pain   Complete by:  As directed    Call MD for:  temperature >100.4   Complete by:  As directed    Diet - low sodium heart healthy   Complete by:  As directed    Diet - low sodium heart healthy   Complete by:  As directed    Increase activity slowly   Complete by:  As directed      Allergies as of 05/14/2018      Reactions   Flounder [fish Allergy] Other (See Comments)   Pt breaks out in bumps   Morphine And Related Other (See Comments)   Reaction:  Hallucinations    Oxycodone Other (See Comments)   Reaction:  Hallucinations   Sulfonamide Derivatives Swelling, Other (See Comments)   Reaction:  Unspecified swelling reaction       Medication List    STOP taking these medications   HYDROcodone-acetaminophen 5-325 MG tablet Commonly known as:  NORCO/VICODIN     TAKE these medications   acetaminophen 500 MG tablet Commonly known as:  TYLENOL Take 500 mg by mouth every 8  (eight) hours as needed for mild pain.   ALPRAZolam 0.25 MG tablet Commonly known as:  XANAX Take 1 tablet (0.25 mg total) by mouth at bedtime as needed for anxiety. What changed:  when to take this   amLODipine 5 MG tablet Commonly known as:  NORVASC Take 1 tablet (5 mg total) by mouth daily.   aspirin 325 MG EC tablet Take 325 mg by mouth daily.   bisacodyl 10 MG suppository Commonly known as:  DULCOLAX Place 1 suppository (10 mg total) rectally daily as needed for moderate constipation.   Dermacloud Crea Apply 1 application topically as needed (each change).   divalproex 125 MG capsule Commonly known as:  DEPAKOTE SPRINKLE Take 125 mg by mouth every evening.   fluconazole 100 MG tablet Commonly known as:  DIFLUCAN Take 1 tablet (100 mg total) by mouth daily.   insulin detemir 100 UNIT/ML injection Commonly known as:  LEVEMIR Inject 0.1 mLs (10 Units total) into the skin 2 (two) times daily.   loratadine-pseudoephedrine 5-120 MG tablet Commonly known as:  CLARITIN-D 12-hour Take 1 tablet by mouth 2 (two) times daily as needed for allergies.   losartan 25 MG tablet Commonly known as:  COZAAR Take 100 mg by mouth daily.   Magnesium Oxide 500 MG Tabs Take 500 mg by mouth daily.   metFORMIN 500 MG tablet Commonly known as:  GLUCOPHAGE Take 500 mg by mouth 2 (two) times daily with a meal.   metoprolol succinate 50 MG 24 hr tablet Commonly known as:  TOPROL-XL Take 1 tablet (50 mg total) by mouth daily.   mirtazapine 15 MG tablet Commonly known as:  REMERON Take 15 mg by mouth at bedtime.   omeprazole 40 MG capsule Commonly known as:  PRILOSEC Take 40 mg by mouth daily.   ondansetron 4 MG tablet Commonly known as:  ZOFRAN Take 4 mg by mouth every 6 (six) hours as needed for nausea/vomiting.   pramipexole 1.5 MG tablet Commonly known as:  MIRAPEX Take 1.5 mg by mouth 3 (three) times daily.      Allergies  Allergen Reactions  . Flounder [Fish Allergy]  Other (See Comments)    Pt breaks out in bumps  . Morphine And Related Other (See Comments)    Reaction:  Hallucinations   . Oxycodone Other (See Comments)    Reaction:  Hallucinations  . Sulfonamide Derivatives Swelling and Other (See Comments)    Reaction:  Unspecified swelling reaction     The results of significant diagnostics from this hospitalization (including imaging, microbiology, ancillary and laboratory) are listed below for reference.    Significant Diagnostic Studies: US Renal  Result Date: 05/10/2018 CLINICAL DATA:  Acute renal failure. EXAM: RENAL / URINARY TRACT ULTRASOUND COMPLETE COMPARISON:  CT abdomen and pelvis 08/10/2016 FINDINGS: Right Kidney: Renal measurements: 10.2 x 4.0 x 4.9 cm = volume: 104 mL. Mildly increased parenchymal echogenicity. No hydronephrosis. 1.1 cm interpolar cyst. Left Kidney: Renal measurements: 10.9 x 4.4 x 4.9 cm = volume: 123 mL. Mildly increased parenchymal echogenicity. No mass or hydronephrosis. Bladder: Decompressed by Foley catheter. Prominent volume echogenic material was incidentally noted in the gallbladder, incompletely evaluated. IMPRESSION: 1. Mildly echogenic kidneys compatible with medical renal disease. 2. No hydronephrosis. 3. Echogenic material in the gallbladder which may reflect sludge and tiny stones. Electronically Signed   By: Logan Bores M.D.   On: 05/10/2018 17:52   Dg Chest Portable 1 View  Result Date: 05/10/2018 CLINICAL DATA:  Fevers and cough EXAM: PORTABLE CHEST 1 VIEW COMPARISON:  09/17/2016 FINDINGS: Cardiac shadows within normal limits. The lungs are well aerated bilaterally. No focal infiltrate or sizable effusion is seen. No acute bony abnormality is noted. Calcified granuloma is noted in the right mid lung. IMPRESSION: No acute abnormality noted. Electronically Signed   By: Inez Catalina M.D.   On: 05/10/2018 07:47    Microbiology: Recent Results (from the past 240 hour(s))  Culture, blood (Routine x 2)      Status: None (Preliminary result)   Collection Time: 05/10/18  7:15 AM  Result Value Ref Range Status   Specimen Description   Final    BLOOD LEFT ANTECUBITAL Performed at [facility] [address] [place], [place] [address]    Special Requests   Final    BOTTLES DRAWN AEROBIC AND ANAEROBIC Blood Culture results may not be optimal due to an excessive volume of blood received in  culture bottles Performed at [facility] [address], [place], [place] [address]    Culture   Final    NO GROWTH 4 DAYS Performed at [facility], [place] [address], [place], [place] [address]    Report Status PENDING  Incomplete  Culture, Urine     Status: Abnormal   Collection Time: 05/10/18 12:15 PM  Result Value Ref Range Status   Specimen Description   Final    URINE, RANDOM Performed at [facility] [address], [place], [place] [address]    Special Requests   Final    NONE Performed at [facility], [place] [address], [place], [place] [address]    Culture 40,000 COLONIES/mL YEAST (A)  Final   Report Status 05/11/2018 FINAL  Final  Culture, blood (Routine x 2)     Status: None (Preliminary result)   Collection Time: 05/10/18  8:05 PM  Result Value Ref Range Status   Specimen Description   Final    BLOOD LEFT HAND Performed at [facility] [address] [place], [place] [address]    Special Requests   Final    BOTTLES DRAWN AEROBIC AND ANAEROBIC Blood Culture adequate volume Performed at [facility] [address], [place], [place] [address]    Culture   Final    NO GROWTH 4 DAYS Performed at [facility], [place] [address] [place], [place] [address]    Report Status PENDING  Incomplete  MRSA PCR Screening     Status: Abnormal   Collection Time: 05/11/18 12:26 AM  Result Value Ref Range Status   MRSA by PCR POSITIVE (A) NEGATIVE Final    Comment:         The GeneXpert MRSA Assay (FDA approved for NASAL specimens only), is one component of a comprehensive MRSA colonization surveillance program. It is not intended to diagnose MRSA infection nor to guide or monitor treatment for MRSA infections. CRITICAL RESULT CALLED TO, READ BACK BY AND VERIFIED WITH: RN ISABELLA AT 2426 05/11/18 Marion A Performed at [facility], [place] [address], [place], [place] [address]      Labs: Basic Metabolic Panel: Recent Labs  Lab 05/10/18 2005 05/11/18 0009 05/11/18 0415 05/12/18 0257 05/13/18 0550  NA 156* 156* 156* 149* 148*  K 3.6 3.3* 3.2* 3.5 3.2*  CL 124* 125* 125* 123* 121*  CO2 21* 23 22 20* 18*  GLUCOSE 339* 230* 126* 108* 116*  BUN 77* 74* 68* 52* 30*  CREATININE 2.28* 2.15* 1.90* 1.17* 0.83  CALCIUM 9.3 9.0 9.2 8.5* 8.2*  MG 2.3  --   --   --   --    Liver Function Tests: Recent Labs  Lab 05/10/18 0715  AST 13*  ALT 18  ALKPHOS 75  BILITOT 1.1  PROT 8.2*  ALBUMIN 4.1   No results for input(s): LIPASE, AMYLASE in the last 168 hours. Recent Labs  Lab 05/10/18 0951  AMMONIA 10   CBC: Recent Labs  Lab 05/10/18 0715 05/11/18 0415 05/12/18 0257 05/13/18 0550  WBC 12.5* 12.5* 8.2 5.6  NEUTROABS 10.7*  --  5.9 4.2  HGB 13.5 11.2* 10.1* 9.9*  HCT 47.2* 41.5 37.0 33.5*  MCV 90.1 94.1 94.1 89.3  PLT 189 121* 102* 108*   Cardiac Enzymes: No results for input(s): CKTOTAL, CKMB, CKMBINDEX, TROPONINI in the last 168 hours. BNP: BNP (last 3 results) No results for input(s): BNP in the  last 8760 hours.  ProBNP (last 3 results) No results for input(s): PROBNP in the last 8760 hours.  CBG: Recent Labs  Lab 05/13/18 0744 05/13/18 1157 05/13/18 1652 05/13/18 2050 05/14/18 0725  GLUCAP 105* 164* 142* 152* 92    Principal Problem:   Sepsis ([place]) Active Problems:   Essential hypertension   Acute encephalopathy   Lactic acidosis   Dementia (HCC)   Hypernatremia   DM type 2 (diabetes  mellitus, type 2) (HCC)   Dehydration   Pressure injury of skin   Time coordinating discharge: 38 minutes.  Signed:        Klaire Court, DO [facility]  05/14/2018, 10:33 AM

## 2018-05-14 NOTE — TOC Transition Note (Signed)
Transition of Care [facility]) - CM/SW Discharge Note   Patient Details  Name: [name] MRN: [id number] Date of Birth: 1940/03/30  [facility] CM/SW Contact:  Leeroy Cha, RN Phone Number: 05/14/2018, 2:37 PM   Clinical Narrative:    DISCHARGED To home iva ptar to return home with the care of the daughter   Final next level of care: Home/Self Care Barriers to Discharge: No Barriers Identified   Patient Goals and CMS Choice Patient states their goals for this hospitalization and ongoing recovery are:: NO RESPONSE       Discharge Placement                       Discharge Plan and Services   Discharge Planning Services: CM Consult                      Social Determinants of Health (SDOH) Interventions     Readmission Risk Interventions No flowsheet data found.

## 2018-05-15 LAB — CULTURE, BLOOD (ROUTINE X 2)
Culture: NO GROWTH
Culture: NO GROWTH
Special Requests: ADEQUATE

## 2018-05-18 ENCOUNTER — Emergency Department (HOSPITAL_COMMUNITY): Payer: Medicare Other

## 2018-05-18 ENCOUNTER — Inpatient Hospital Stay (HOSPITAL_COMMUNITY)
Admission: EM | Admit: 2018-05-18 | Discharge: 2018-05-24 | DRG: 193 | Disposition: A | Discharge status: Home / Self Care | Payer: Medicare Other | Attending: Family Medicine | Admitting: Family Medicine

## 2018-05-18 ENCOUNTER — Other Ambulatory Visit: Payer: Self-pay

## 2018-05-18 ENCOUNTER — Encounter (HOSPITAL_COMMUNITY): Payer: Self-pay

## 2018-05-18 DIAGNOSIS — Z79899 Other long term (current) drug therapy: Secondary | ICD-10-CM

## 2018-05-18 DIAGNOSIS — Z20828 Contact with and (suspected) exposure to other viral communicable diseases: Secondary | ICD-10-CM | POA: Diagnosis not present

## 2018-05-18 DIAGNOSIS — Z7982 Long term (current) use of aspirin: Secondary | ICD-10-CM

## 2018-05-18 DIAGNOSIS — E7800 Pure hypercholesterolemia, unspecified: Secondary | ICD-10-CM | POA: Diagnosis not present

## 2018-05-18 DIAGNOSIS — F329 Major depressive disorder, single episode, unspecified: Secondary | ICD-10-CM | POA: Diagnosis not present

## 2018-05-18 DIAGNOSIS — J181 Lobar pneumonia, unspecified organism: Secondary | ICD-10-CM

## 2018-05-18 DIAGNOSIS — Z794 Long term (current) use of insulin: Secondary | ICD-10-CM | POA: Diagnosis not present

## 2018-05-18 DIAGNOSIS — Z90710 Acquired absence of both cervix and uterus: Secondary | ICD-10-CM | POA: Diagnosis not present

## 2018-05-18 DIAGNOSIS — Z833 Family history of diabetes mellitus: Secondary | ICD-10-CM | POA: Diagnosis not present

## 2018-05-18 DIAGNOSIS — Z22322 Carrier or suspected carrier of Methicillin resistant Staphylococcus aureus: Secondary | ICD-10-CM | POA: Diagnosis not present

## 2018-05-18 DIAGNOSIS — I10 Essential (primary) hypertension: Secondary | ICD-10-CM | POA: Diagnosis present

## 2018-05-18 DIAGNOSIS — F0280 Dementia in other diseases classified elsewhere without behavioral disturbance: Secondary | ICD-10-CM | POA: Diagnosis not present

## 2018-05-18 DIAGNOSIS — E876 Hypokalemia: Secondary | ICD-10-CM | POA: Diagnosis present

## 2018-05-18 DIAGNOSIS — G2581 Restless legs syndrome: Secondary | ICD-10-CM | POA: Diagnosis present

## 2018-05-18 DIAGNOSIS — R4182 Altered mental status, unspecified: Secondary | ICD-10-CM

## 2018-05-18 DIAGNOSIS — N390 Urinary tract infection, site not specified: Secondary | ICD-10-CM | POA: Diagnosis not present

## 2018-05-18 DIAGNOSIS — L89153 Pressure ulcer of sacral region, stage 3: Secondary | ICD-10-CM | POA: Diagnosis present

## 2018-05-18 DIAGNOSIS — M48062 Spinal stenosis, lumbar region with neurogenic claudication: Secondary | ICD-10-CM | POA: Diagnosis present

## 2018-05-18 DIAGNOSIS — Z8249 Family history of ischemic heart disease and other diseases of the circulatory system: Secondary | ICD-10-CM

## 2018-05-18 DIAGNOSIS — J189 Pneumonia, unspecified organism: Secondary | ICD-10-CM | POA: Diagnosis not present

## 2018-05-18 DIAGNOSIS — Y95 Nosocomial condition: Secondary | ICD-10-CM | POA: Diagnosis present

## 2018-05-18 DIAGNOSIS — E1165 Type 2 diabetes mellitus with hyperglycemia: Secondary | ICD-10-CM | POA: Diagnosis present

## 2018-05-18 DIAGNOSIS — G9341 Metabolic encephalopathy: Secondary | ICD-10-CM | POA: Diagnosis not present

## 2018-05-18 DIAGNOSIS — M1990 Unspecified osteoarthritis, unspecified site: Secondary | ICD-10-CM | POA: Diagnosis present

## 2018-05-18 DIAGNOSIS — E119 Type 2 diabetes mellitus without complications: Secondary | ICD-10-CM

## 2018-05-18 DIAGNOSIS — Z885 Allergy status to narcotic agent status: Secondary | ICD-10-CM | POA: Diagnosis not present

## 2018-05-18 DIAGNOSIS — Z91013 Allergy to seafood: Secondary | ICD-10-CM

## 2018-05-18 DIAGNOSIS — Z66 Do not resuscitate: Secondary | ICD-10-CM | POA: Diagnosis present

## 2018-05-18 DIAGNOSIS — K219 Gastro-esophageal reflux disease without esophagitis: Secondary | ICD-10-CM | POA: Diagnosis present

## 2018-05-18 DIAGNOSIS — F341 Dysthymic disorder: Secondary | ICD-10-CM | POA: Diagnosis present

## 2018-05-18 DIAGNOSIS — J15212 Pneumonia due to Methicillin resistant Staphylococcus aureus: Secondary | ICD-10-CM | POA: Diagnosis not present

## 2018-05-18 DIAGNOSIS — Z7984 Long term (current) use of oral hypoglycemic drugs: Secondary | ICD-10-CM

## 2018-05-18 DIAGNOSIS — F419 Anxiety disorder, unspecified: Secondary | ICD-10-CM | POA: Diagnosis present

## 2018-05-18 DIAGNOSIS — F0390 Unspecified dementia without behavioral disturbance: Secondary | ICD-10-CM | POA: Diagnosis not present

## 2018-05-18 DIAGNOSIS — Z882 Allergy status to sulfonamides status: Secondary | ICD-10-CM | POA: Diagnosis not present

## 2018-05-18 HISTORY — DX: Urinary tract infection, site not specified: N39.0

## 2018-05-18 LAB — URINALYSIS, ROUTINE W REFLEX MICROSCOPIC
Bacteria, UA: NONE SEEN
Bilirubin Urine: NEGATIVE
Glucose, UA: >=500 mg/dL — AB
Ketones, ur: NEGATIVE mg/dL
Nitrite: NEGATIVE
Protein, ur: 100 mg/dL — AB
Specific Gravity, Urine: 1.01 (ref 1.005–1.030)
pH: 6 (ref 5.0–8.0)

## 2018-05-18 LAB — CBC WITH DIFFERENTIAL/PLATELET
Abs Immature Granulocytes: 0.04 10*3/uL (ref 0.00–0.07)
BASOS ABS: 0 10*3/uL (ref 0.0–0.1)
Basophils Relative: 0 %
Eosinophils Absolute: 0.1 10*3/uL (ref 0.0–0.5)
Eosinophils Relative: 2 %
HCT: 39.8 % (ref 36.0–46.0)
Hemoglobin: 12.2 g/dL (ref 12.0–15.0)
IMMATURE GRANULOCYTES: 1 %
Lymphocytes Relative: 24 %
Lymphs Abs: 1.6 10*3/uL (ref 0.7–4.0)
MCH: 26.1 pg (ref 26.0–34.0)
MCHC: 30.7 g/dL (ref 30.0–36.0)
MCV: 85.2 fL (ref 80.0–100.0)
Monocytes Absolute: 0.4 10*3/uL (ref 0.1–1.0)
Monocytes Relative: 7 %
NEUTROS ABS: 4.5 10*3/uL (ref 1.7–7.7)
NEUTROS PCT: 66 %
NRBC: 0 % (ref 0.0–0.2)
Platelets: 324 10*3/uL (ref 150–400)
RBC: 4.67 MIL/uL (ref 3.87–5.11)
RDW: 16.1 % — ABNORMAL HIGH (ref 11.5–15.5)
WBC: 6.7 10*3/uL (ref 4.0–10.5)

## 2018-05-18 LAB — COMPREHENSIVE METABOLIC PANEL
ALBUMIN: 3.1 g/dL — AB (ref 3.5–5.0)
ALT: 16 U/L (ref 0–44)
AST: 15 U/L (ref 15–41)
Alkaline Phosphatase: 71 U/L (ref 38–126)
Anion gap: 9 (ref 5–15)
BUN: 8 mg/dL (ref 8–23)
CO2: 24 mmol/L (ref 22–32)
Calcium: 9.1 mg/dL (ref 8.9–10.3)
Chloride: 107 mmol/L (ref 98–111)
Creatinine, Ser: 0.64 mg/dL (ref 0.44–1.00)
GFR calc Af Amer: >60 mL/min (ref >60)
GFR calc non Af Amer: >60 mL/min (ref >60)
GLUCOSE: 205 mg/dL — AB (ref 70–99)
Potassium: 3.2 mmol/L — ABNORMAL LOW (ref 3.5–5.1)
SODIUM: 140 mmol/L (ref 135–145)
Total Bilirubin: 0.4 mg/dL (ref 0.3–1.2)
Total Protein: 7 g/dL (ref 6.5–8.1)

## 2018-05-18 LAB — CBG MONITORING, ED: Glucose-Capillary: 199 mg/dL — ABNORMAL HIGH (ref 70–99)

## 2018-05-18 MED ORDER — VANCOMYCIN HCL 10 G IV SOLR
1500.0000 mg | INTRAVENOUS | Status: DC
  Administered 2018-05-20 (×2): 1500 mg via INTRAVENOUS
  Filled 2018-05-18 (×2): qty 1500

## 2018-05-18 MED ORDER — VANCOMYCIN HCL 10 G IV SOLR
1750.0000 mg | Freq: Once | INTRAVENOUS | Status: AC
  Administered 2018-05-19: 1750 mg via INTRAVENOUS
  Filled 2018-05-18: qty 1750

## 2018-05-18 MED ORDER — SODIUM CHLORIDE 0.9 % IV SOLN
2.0000 g | Freq: Three times a day (TID) | INTRAVENOUS | Status: DC
  Administered 2018-05-19 – 2018-05-21 (×8): 2 g via INTRAVENOUS
  Filled 2018-05-18 (×8): qty 2

## 2018-05-18 MED ORDER — SODIUM CHLORIDE 0.9 % IV SOLN: 2.0000 g | Freq: Once | INTRAVENOUS | Status: DC

## 2018-05-18 MED ORDER — SODIUM CHLORIDE 0.9 % IV SOLN
500.0000 mg | Freq: Once | INTRAVENOUS | Status: AC
  Administered 2018-05-19: 500 mg via INTRAVENOUS
  Filled 2018-05-18 (×2): qty 500

## 2018-05-18 MED ORDER — SODIUM CHLORIDE 0.9 % IV SOLN
1.0000 g | Freq: Once | INTRAVENOUS | Status: AC
  Administered 2018-05-18: 1 g via INTRAVENOUS
  Filled 2018-05-18: qty 10

## 2018-05-18 NOTE — ED Notes (Signed)
BLOOD CULTURE OBTAINED LOGAN RN LEFT AC

## 2018-05-18 NOTE — ED Notes (Signed)
Bed: MP53 Expected date:  Expected time:  Means of arrival:  Comments: EMS 78 y/o F

## 2018-05-18 NOTE — ED Provider Notes (Signed)
Medical screening examination/treatment/procedure(s) were conducted as a shared visit with non-physician practitioner(s) and myself.  I personally evaluated the patient during the encounter.  EKG Interpretation  Date/Time:  Friday May 18 2018 18:17:44 EDT Ventricular Rate:  71 PR Interval:    QRS Duration: 93 QT Interval:  414 QTC Calculation: 450 R Axis:   -31 Text Interpretation:  Sinus rhythm Atrial premature complexes Abnormal R-wave progression, early transition LVH with secondary repolarization abnormality No STEMI.  Confirmed by Nanda Quinton [phone]) on 05/18/2018 6:23:11 PM  78 year old female presents with increased generalized weakness, lethargy as well as some urinary retention.  Chest x-ray shows pneumonia.  Will place on antibiotics and admit to the hospitalist service   Lacretia Leigh, MD 05/18/18 2231

## 2018-05-18 NOTE — H&P (Signed)
History and Physical   [name] HWE:[id number] DOB: 1940-06-29 DOA: 05/18/2018  Referring MD/NP/PA: Dr. Vivi Martens  PCP: [name], MD   Outpatient Specialists: none   Patient coming from: Home  Chief Complaint: Altered mental status  HPI: [name] is a 78 y.o. female with medical history significant of recent admission from March 19-23 with sepsis hypotension due to UTI with urine culture showing yeast, history of dementia, hypertension, stage IV sacral decubitus ulcer, diabetes, periprosthetic right knee fracture with bedbound status who was brought back today due to urinary retention weakness as well as altered mental status for 2 days.  Patient has had decreased p.o. intake.  She is not able to communicate adequately..  ED Course: Temperature is 99 2 blood pressure 170/108 pulse 83 respiratory rate of 26 oxygen sats 93% on room air.  White count is 6.7 hemoglobin 12.2 and platelet count 324.  Sodium is 140 potassium 3.2 the rest of the electrolytes are within normal.  Glucose is 205.  Urinalysis showed WBC and leukocyte positive but no visible bacteria.  Head CT without contrast showed no active disease.  Chest x-ray shows Retrocardiac and right suprahilar opacities questionable pneumonia.  Patient has been worked up including sample of COVID-19 taken.  She is being admitted for treatment  Review of Systems: As per HPI otherwise 10 point review of systems negative.    Past Medical History:  Diagnosis Date  . Anxiety   . Depression   . Diabetes mellitus   . Dyspnea   . GERD (gastroesophageal reflux disease)   . Headache(784.0)   . HTN (hypertension)    dr Johnsie Cancel  . Hypercholesteremia    DENIES  . Osteoarthritis   . PONV (postoperative nausea and vomiting)   . RLS (restless legs syndrome)   . Stress     Past Surgical History:  Procedure Laterality Date  . ABDOMINAL HYSTERECTOMY    . COLONOSCOPY    . JOINT REPLACEMENT    . left forearm fracture  with ORIF Left   . LUMBAR LAMINECTOMY/DECOMPRESSION MICRODISCECTOMY N/A 04/18/2012   Procedure: LUMBAR LAMINECTOMY/DECOMPRESSION MICRODISCECTOMY 3 LEVELS;  Surgeon: Winfield Cunas, MD;  Location: [facility];  Service: Neurosurgery;  Laterality: N/A;  Lumbar three-four, lumbar four-five, lumbar five-sacral one decompression. Synovial cyst resection lumbar four-five  . ORIF PERIPROSTHETIC FRACTURE Right 07/06/2016   Procedure: OPEN REDUCTION INTERNAL FIXATION (ORIF) PERIPROSTHETIC FRACTURE;  Surgeon: Gaynelle Arabian, MD;  Location: [facility];  Service: Orthopedics;  Laterality: Right;  . REPLACEMENT TOTAL KNEE Right   . WISDOM TOOTH EXTRACTION       reports that she has never smoked. She has never used smokeless tobacco. She reports that she does not drink alcohol or use drugs.  Allergies  Allergen Reactions  . Flounder [Fish Allergy] Other (See Comments)    Pt breaks out in bumps  . Morphine And Related Other (See Comments)    Reaction:  Hallucinations   . Oxycodone Other (See Comments)    Reaction:  Hallucinations  . Sulfonamide Derivatives Swelling and Other (See Comments)    Reaction:  Unspecified swelling reaction     Family History  Problem Relation Age of Onset  . Diabetes Father   . High blood pressure Father   . High blood pressure Mother   . Colon cancer Neg Hx   . Esophageal cancer Neg Hx   . Liver disease Neg Hx   . Stomach cancer Neg Hx   . Pancreatic cancer Neg Hx  Prior to Admission medications   Medication Sig Start Date End Date Taking? Authorizing Provider  acetaminophen (TYLENOL) 500 MG tablet Take 500 mg by mouth every 8 (eight) hours as needed for mild pain.   Yes [provider]  ALPRAZolam (XANAX) 0.25 MG tablet Take 1 tablet (0.25 mg total) by mouth at bedtime as needed for anxiety. Patient taking differently: Take 0.25 mg by mouth at bedtime.  09/12/16  Yes Dessa Phi, DO  amLODipine (NORVASC) 5 MG tablet Take 1 tablet (5 mg total) by mouth  daily. 09/12/16  Yes Dessa Phi, DO  aspirin 325 MG EC tablet Take 325 mg by mouth daily.   Yes [provider]  divalproex (DEPAKOTE SPRINKLE) 125 MG capsule Take 125 mg by mouth every evening.  02/08/18  Yes [provider]  fluconazole (DIFLUCAN) 100 MG tablet Take 1 tablet (100 mg total) by mouth daily. 05/14/18  Yes Swayze, Ava, DO  Infant Care Products (DERMACLOUD) CREA Apply 1 application topically as needed (each change).   Yes [provider]  loratadine-pseudoephedrine (CLARITIN-D 12-HOUR) 5-120 MG tablet Take 1 tablet by mouth 2 (two) times daily as needed for allergies.   Yes [provider]  losartan (COZAAR) 25 MG tablet Take 100 mg by mouth daily.   Yes [provider]  Magnesium Oxide 500 MG TABS Take 500 mg by mouth daily.    Yes [provider]  metFORMIN (GLUCOPHAGE) 500 MG tablet Take 500 mg by mouth 2 (two) times daily with a meal.   Yes [provider]  metoprolol succinate (TOPROL-XL) 50 MG 24 hr tablet Take 1 tablet (50 mg total) by mouth daily. 07/04/14  Yes Robbie Lis, MD  mirtazapine (REMERON) 15 MG tablet Take 15 mg by mouth at bedtime. 02/27/18  Yes [provider]  omeprazole (PRILOSEC) 40 MG capsule Take 40 mg by mouth daily. 03/09/18  Yes [provider]  ondansetron (ZOFRAN) 4 MG tablet Take 4 mg by mouth every 6 (six) hours as needed for nausea/vomiting. 02/13/18  Yes [provider]  pramipexole (MIRAPEX) 1.5 MG tablet Take 1.5 mg by mouth 3 (three) times daily.   Yes [provider]  GLYXAMBI 10-5 MG TABS Take 1 tablet by mouth daily. 03/20/18   [provider]  insulin detemir (LEVEMIR) 100 UNIT/ML injection Inject 0.1 mLs (10 Units total) into the skin 2 (two) times daily. Patient not taking: Reported on 05/18/2018 05/14/18   Karie Kirks, DO    Physical Exam: Vitals:   05/18/18 2034 05/18/18 2037 05/18/18 2143 05/18/18 2231  BP:  (!) 159/106 116/65 (!)  155/95  Pulse:  83 76 75  Resp:  (!) 24 (!) 26 (!) 22  Temp: 99.2 F (37.3 C)     TempSrc: Rectal     SpO2:  97% 96% 98%  Weight:      Height:          Constitutional: NAD, calm, no agitation Vitals:   05/18/18 2034 05/18/18 2037 05/18/18 2143 05/18/18 2231  BP:  (!) 159/106 116/65 (!) 155/95  Pulse:  83 76 75  Resp:  (!) 24 (!) 26 (!) 22  Temp: 99.2 F (37.3 C)     TempSrc: Rectal     SpO2:  97% 96% 98%  Weight:      Height:       Eyes: PERRL, lids and conjunctivae normal ENMT: Mucous membranes are moist. Posterior pharynx clear of any exudate or lesions.Normal dentition.  Neck: normal, supple,  no masses, no thyromegaly Respiratory: Coarse breath sounds bilaterally, no wheezing, no crackles. Normal respiratory effort. No accessory muscle use.  Cardiovascular: Regular rate and rhythm, no murmurs / rubs / gallops. No extremity edema. 2+ pedal pulses. No carotid bruits.  Abdomen: no tenderness, no masses palpated. No hepatosplenomegaly. Bowel sounds positive.  Musculoskeletal: no clubbing / cyanosis. No joint deformity upper and lower extremities. Good ROM, no contractures. Normal muscle tone.  Skin: Stage IV sacral decubitus ulcer  Neurologic: CN 2-12 grossly intact. Sensation intact, DTR normal. Strength 5/5 in all 4.  Psychiatric: Confused, slight agitation    Labs on Admission: I have personally reviewed following labs and imaging studies  CBC: Recent Labs  Lab 05/12/18 0257 05/13/18 0550 05/18/18 1900  WBC 8.2 5.6 6.7  NEUTROABS 5.9 4.2 4.5  HGB 10.1* 9.9* 12.2  HCT 37.0 33.5* 39.8  MCV 94.1 89.3 85.2  PLT 102* 108* 660   Basic Metabolic Panel: Recent Labs  Lab 05/12/18 0257 05/13/18 0550 05/18/18 1900  NA 149* 148* 140  K 3.5 3.2* 3.2*  CL 123* 121* 107  CO2 20* 18* 24  GLUCOSE 108* 116* 205*  BUN 52* 30* 8  CREATININE 1.17* 0.83 0.64  CALCIUM 8.5* 8.2* 9.1   GFR: Estimated Creatinine Clearance: 64.1 mL/min (by C-G formula based on SCr of 0.64  mg/dL). Liver Function Tests: Recent Labs  Lab 05/18/18 1900  AST 15  ALT 16  ALKPHOS 71  BILITOT 0.4  PROT 7.0  ALBUMIN 3.1*   No results for input(s): LIPASE, AMYLASE in the last 168 hours. No results for input(s): AMMONIA in the last 168 hours. Coagulation Profile: No results for input(s): INR, PROTIME in the last 168 hours. Cardiac Enzymes: No results for input(s): CKTOTAL, CKMB, CKMBINDEX, TROPONINI in the last 168 hours. BNP (last 3 results) No results for input(s): PROBNP in the last 8760 hours. HbA1C: No results for input(s): HGBA1C in the last 72 hours. CBG: Recent Labs  Lab 05/13/18 1652 05/13/18 2050 05/14/18 0725 05/14/18 1114 05/18/18 1821  GLUCAP 142* 152* 92 148* 199*   Lipid Profile: No results for input(s): CHOL, HDL, LDLCALC, TRIG, CHOLHDL, LDLDIRECT in the last 72 hours. Thyroid Function Tests: No results for input(s): TSH, T4TOTAL, FREET4, T3FREE, THYROIDAB in the last 72 hours. Anemia Panel: No results for input(s): VITAMINB12, FOLATE, FERRITIN, TIBC, IRON, RETICCTPCT in the last 72 hours. Urine analysis:    Component Value Date/Time   COLORURINE YELLOW 05/18/2018 1900   APPEARANCEUR CLEAR 05/18/2018 1900   LABSPEC 1.010 05/18/2018 1900   PHURINE 6.0 05/18/2018 1900   GLUCOSEU >=500 (A) 05/18/2018 1900   HGBUR SMALL (A) 05/18/2018 1900   BILIRUBINUR NEGATIVE 05/18/2018 1900   KETONESUR NEGATIVE 05/18/2018 1900   PROTEINUR 100 (A) 05/18/2018 1900   UROBILINOGEN 0.2 07/20/2014 2228   NITRITE NEGATIVE 05/18/2018 1900   LEUKOCYTESUR SMALL (A) 05/18/2018 1900   Sepsis Labs: @LABRCNTIP (procalcitonin:4,lacticidven:4) ) Recent Results (from the past 240 hour(s))  Culture, blood (Routine x 2)     Status: None   Collection Time: 05/10/18  7:15 AM  Result Value Ref Range Status   Specimen Description   Final    BLOOD LEFT ANTECUBITAL Performed at [facility], [place] [address] [place], [place] [address]    Special Requests    Final    BOTTLES DRAWN AEROBIC AND ANAEROBIC Blood Culture results may not be optimal due to an excessive volume of blood received in culture bottles Performed at [facility]  [address], [place], [place] [address]    Culture   Final    NO GROWTH 5 DAYS Performed at [facility], [place] [address] [place], [place] [address]    Report Status 05/15/2018 FINAL  Final  Culture, Urine     Status: Abnormal   Collection Time: 05/10/18 12:15 PM  Result Value Ref Range Status   Specimen Description   Final    URINE, RANDOM Performed at [facility] [address], [place], [place] [address]    Special Requests   Final    NONE Performed at [facility], [place] [address], [place], [place] [address]    Culture 40,000 COLONIES/mL YEAST (A)  Final   Report Status 05/11/2018 FINAL  Final  Culture, blood (Routine x 2)     Status: None   Collection Time: 05/10/18  8:05 PM  Result Value Ref Range Status   Specimen Description   Final    BLOOD LEFT HAND Performed at [facility] [address] [place], [place] [address]    Special Requests   Final    BOTTLES DRAWN AEROBIC AND ANAEROBIC Blood Culture adequate volume Performed at [facility] [address], [place], [place] [address]    Culture   Final    NO GROWTH 5 DAYS Performed at [facility], [place] [address] [place], [place] [address]    Report Status 05/15/2018 FINAL  Final  MRSA PCR Screening     Status: Abnormal   Collection Time: 05/11/18 12:26 AM  Result Value Ref Range Status   MRSA by PCR POSITIVE (A) NEGATIVE Final    Comment:        The GeneXpert MRSA Assay (FDA approved for NASAL specimens only), is one component of a comprehensive MRSA colonization surveillance program. It is not intended to diagnose MRSA infection nor to guide or monitor treatment for MRSA infections. CRITICAL RESULT CALLED TO, READ BACK BY  AND VERIFIED WITH: RN ISABELLA AT 9629 05/11/18 Com[place] A Performed at [facility], [place] [address] [place], [place] [address]      Radiological Exams on Admission: Dg Chest 2 View  Result Date: 05/18/2018 CLINICAL DATA:  Altered mental status. Generalized weakness and lethargy for 3 days. EXAM: CHEST - 2 VIEW COMPARISON:  Radiograph 05/10/2018 FINDINGS: Low lung volumes. Unchanged heart size and mediastinal contours. Vague retrocardiac and right suprahilar opacity. Calcified granuloma in the right mid lung. No large pleural effusion. No pneumothorax. No acute osseous abnormalities. IMPRESSION: Vague retrocardiac and right suprahilar opacities, nonspecific. This may represent atelectasis, or pneumonia, including atypical viral infection. Opacities are new from exam 1 week ago. Electronically Signed   By: Keith Rake M.D.   On: 05/18/2018 19:55   Ct Head Wo Contrast  Result Date: 05/18/2018 CLINICAL DATA:  Encephalopathy. EXAM: CT HEAD WITHOUT CONTRAST TECHNIQUE: Contiguous axial images were obtained from the base of the skull through the vertex without intravenous contrast. COMPARISON:  08/10/2016 FINDINGS: Brain: No intracranial hemorrhage, mass effect, or midline shift. Stable moderate to advanced atrophy. Stable moderate to advanced chronic small vessel ischemia. No hydrocephalus. The basilar cisterns are patent. No evidence of territorial infarct or acute ischemia. No extra-axial or intracranial fluid collection. Vascular: Atherosclerosis of skullbase vasculature without hyperdense vessel or abnormal calcification. Skull: No fracture or suspicious lesion. Small frontal bony excrescence, stable from many years and considered benign. Sinuses/Orbits: Paranasal sinuses and mastoid air cells are clear. The visualized orbits are  unremarkable. Bilateral cataract resection. Other: None. IMPRESSION: 1. No acute intracranial abnormality. 2. Unchanged atrophy and chronic small  vessel ischemia. Electronically Signed   By: Keith Rake M.D.   On: 05/18/2018 21:19      Assessment/Plan Principal Problem:   Pneumonia Active Problems:   HYPERCHOLESTEROLEMIA   ANXIETY DEPRESSION   Essential hypertension   Spinal stenosis, lumbar region, with neurogenic claudication   UTI (urinary tract infection)   Dementia ([place])   DM type 2 (diabetes mellitus, type 2) ([place])     #1.  Healthcare associated pneumonia: Patient just left the hospital and is back with findings suggestive of pneumonia.  We will treat her with regimen for healthcare associated pneumonia.  Also low risk for [place] COVID-19 which is going to be tested.  Continue other supportive care.  Aspiration pneumonia also likely.  #2 UTI: Yeast urea noted recently.  Patient still on Diflucan.  Continue with Diflucan.  #3 dementia: Advanced dementia.  Continue supportive care  #4 diabetes: Continue with sliding scale insulin and hold metformin.  #5 hypertension: Continue blood pressure control.  #6 anxiety with depression: Continue home regimen and monitor closely.  #7 stage IV sacral decubitus ulcer: Continue wound care.  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   DVT prophylaxis: Lovenox Code Status: DNR Family Communication: Daughters over the phone Disposition Plan: To be determined Consults called: None Admission status: Inpatient  Severity of Illness: The appropriate patient status for this patient is INPATIENT. Inpatient status is judged to be reasonable and necessary in order to provide the required intensity of service to ensure the patient's safety. The patient's presenting symptoms, physical exam findings, and initial radiographic and laboratory data in the context of their chronic comorbidities is felt to place them at high risk for further clinical deterioration. Furthermore, it is not anticipated that the patient will be medically stable for discharge from the hospital within 2  midnights of admission. The following factors support the patient status of inpatient.   " The patient's presenting symptoms include confusion and weakness. " The worrisome physical exam findings include confusion. " The initial radiographic and laboratory data are worrisome because of chest x-ray suggestive of pneumonia. " The chronic co-morbidities include diabetes hypertension and dementia.   * I certify that at the point of admission it is my clinical judgment that the patient will require inpatient hospital care spanning beyond 2 midnights from the point of admission due to high intensity of service, high risk for further deterioration and high frequency of surveillance required.Barbette Merino MD [facility] Pager [phone]  If 7PM-7AM, please contact night-coverage [email or web address] Password [facility]  05/18/2018, 11:09 PM

## 2018-05-18 NOTE — ED Provider Notes (Signed)
[facility] DEPT Provider Note   CSN: [id number] Arrival date & time: 05/18/18  1806    History   Chief Complaint Chief Complaint  Patient presents with  . Weakness  . Urinary Retention  . Hyperglycemia    HPI [name] is a 78 y.o. female.    Pt presents today via EMS from home for AMS X 3-4 days.   ROS/HPI provided solely by daughters report taken by EMS on scene.  Daughter reported decreased alertness, decreased PO intake and urinary retention.  Currently, pt with dementia at baseline and cannot provide me with any answers to questions.   When asked questions, pt just reaches out to me, but will not speak or have any meaningful interaction.   Her vitals on arrival are stable and she does not appear toxic.     The history is provided by the EMS personnel. The history is limited by the condition of the patient.  Hyperglycemia    Past Medical History:  Diagnosis Date  . Anxiety   . Depression   . Diabetes mellitus   . Dyspnea   . GERD (gastroesophageal reflux disease)   . Headache(784.0)   . HTN (hypertension)    dr Johnsie Cancel  . Hypercholesteremia    DENIES  . Osteoarthritis   . PONV (postoperative nausea and vomiting)   . RLS (restless legs syndrome)   . Stress     Patient Active Problem List   Diagnosis Date Noted  . Pressure injury of skin 05/11/2018  . Hypertensive urgency 09/11/2016  . Dental abscess 09/11/2016  . Hypokalemia 09/11/2016  . 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 Acute post-hemorrhagic anemia 07/09/2016  . Symptomatic anemia 07/09/2016  . 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 Abdominal pain, chronic, epigastric 08/10/2015  . Dehydration   . Leukocytosis   . Aspiration pneumonia ([place]) 07/21/2014  . DM type 2 (diabetes mellitus, type 2) ([place]) 07/21/2014  . Palliative care encounter 07/21/2014  . Dysphagia 07/09/2014  . Decubitus ulcer 07/09/2014  . Blood  poisoning   . Acute respiratory failure with hypoxia ([place]) 06/30/2014  . Septic shock ([place]) 06/30/2014  . Lactic acidosis 06/30/2014  . Dementia ([place]) 06/30/2014  . Acute respiratory failure with hypoxemia ([place])   . Hypernatremia   . Acute kidney injury ([place]) 05/28/2014  . Fever 05/27/2014  . Lower urinary tract infectious disease 05/27/2014  . Sepsis ([place]) 05/27/2014  . Acute encephalopathy   . FTT (failure to thrive) in adult   . Gait instability 07/18/2013    Class: Acute  . Low back pain 07/18/2013    Class: Acute  . Nausea with vomiting 07/18/2013    Class: Acute  . Abnormality of gait 04/29/2013  . 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 Head trauma 06/03/2010  . HYPERCHOLESTEROLEMIA 05/15/2008  . ANXIETY DEPRESSION 05/15/2008  . DEPRESSION 05/15/2008  . Essential hypertension 05/15/2008  . OSTEOARTHRITIS 05/15/2008  . Edema 05/15/2008  . DYSPNEA ON EXERTION 05/15/2008  . ELECTROCARDIOGRAM, ABNORMAL 05/15/2008    Past Surgical History:  Procedure Laterality Date  . ABDOMINAL HYSTERECTOMY    . COLONOSCOPY    . JOINT REPLACEMENT    . left forearm fracture with ORIF Left   . LUMBAR LAMINECTOMY/DECOMPRESSION MICRODISCECTOMY N/A 04/18/2012   Procedure: LUMBAR LAMINECTOMY/DECOMPRESSION MICRODISCECTOMY 3 LEVELS;  Surgeon: Winfield Cunas, MD;  Location: [facility];  Service: Neurosurgery;  Laterality: N/A;  Lumbar three-four, lumbar four-five, lumbar five-sacral one decompression. Synovial  cyst resection lumbar four-five  . ORIF PERIPROSTHETIC FRACTURE Right 07/06/2016   Procedure: OPEN REDUCTION INTERNAL FIXATION (ORIF) PERIPROSTHETIC FRACTURE;  Surgeon: Gaynelle Arabian, MD;  Location: [facility];  Service: Orthopedics;  Laterality: Right;  . REPLACEMENT TOTAL KNEE Right   . WISDOM TOOTH EXTRACTION       OB History   No obstetric history on file.      Home Medications    Prior to Admission medications   Medication Sig Start Date End  Date Taking? Authorizing Provider  acetaminophen (TYLENOL) 500 MG tablet Take 500 mg by mouth every 8 (eight) hours as needed for mild pain.   Yes [provider]  ALPRAZolam (XANAX) 0.25 MG tablet Take 1 tablet (0.25 mg total) by mouth at bedtime as needed for anxiety. Patient taking differently: Take 0.25 mg by mouth at bedtime.  09/12/16  Yes Dessa Phi, DO  amLODipine (NORVASC) 5 MG tablet Take 1 tablet (5 mg total) by mouth daily. 09/12/16  Yes Dessa Phi, DO  aspirin 325 MG EC tablet Take 325 mg by mouth daily.   Yes [provider]  divalproex (DEPAKOTE SPRINKLE) 125 MG capsule Take 125 mg by mouth every evening.  02/08/18  Yes [provider]  fluconazole (DIFLUCAN) 100 MG tablet Take 1 tablet (100 mg total) by mouth daily. 05/14/18  Yes Swayze, Ava, DO  Infant Care Products (DERMACLOUD) CREA Apply 1 application topically as needed (each change).   Yes [provider]  loratadine-pseudoephedrine (CLARITIN-D 12-HOUR) 5-120 MG tablet Take 1 tablet by mouth 2 (two) times daily as needed for allergies.   Yes [provider]  losartan (COZAAR) 25 MG tablet Take 100 mg by mouth daily.   Yes [provider]  Magnesium Oxide 500 MG TABS Take 500 mg by mouth daily.    Yes [provider]  metFORMIN (GLUCOPHAGE) 500 MG tablet Take 500 mg by mouth 2 (two) times daily with a meal.   Yes [provider]  metoprolol succinate (TOPROL-XL) 50 MG 24 hr tablet Take 1 tablet (50 mg total) by mouth daily. 07/04/14  Yes Robbie Lis, MD  mirtazapine (REMERON) 15 MG tablet Take 15 mg by mouth at bedtime. 02/27/18  Yes [provider]  omeprazole (PRILOSEC) 40 MG capsule Take 40 mg by mouth daily. 03/09/18  Yes [provider]  ondansetron (ZOFRAN) 4 MG tablet Take 4 mg by mouth every 6 (six) hours as needed for nausea/vomiting. 02/13/18  Yes [provider]  pramipexole (MIRAPEX) 1.5 MG tablet Take 1.5 mg by  mouth 3 (three) times daily.   Yes [provider]  GLYXAMBI 10-5 MG TABS Take 1 tablet by mouth daily. 03/20/18   [provider]  insulin detemir (LEVEMIR) 100 UNIT/ML injection Inject 0.1 mLs (10 Units total) into the skin 2 (two) times daily. Patient not taking: Reported on 05/18/2018 05/14/18   Swayze, Ava, DO    Family History Family History  Problem Relation Age of Onset  . Diabetes Father   . High blood pressure Father   . High blood pressure Mother   . Colon cancer Neg Hx   . Esophageal cancer Neg Hx   . Liver disease Neg Hx   . Stomach cancer Neg Hx   . Pancreatic cancer Neg Hx     Social History Social History   Tobacco Use  . Smoking status: Never Smoker  . Smokeless tobacco: Never Used  Substance Use Topics  . Alcohol use: No  Alcohol/week: 0.0 standard drinks  . Drug use: No     Allergies   Flounder [fish allergy]; Morphine and related; Oxycodone; and Sulfonamide derivatives   Review of Systems Review of Systems  Unable to perform ROS: Dementia (dementia at baseline; AMS)  Constitutional: Positive for activity change.       Daughter reports to EMS decreased alertness, decrease PO intake, and urinary retention.     Physical Exam Updated Vital Signs BP 116/65 (BP Location: Right Arm)   Pulse 76   Temp 99.2 F (37.3 C) (Rectal)   Resp (!) 26   Ht 5\' 7"  (1.702 m)   Wt 85.5 kg   SpO2 96%   BMI 29.52 kg/m   Physical Exam Constitutional:      General: She is not in acute distress.    Appearance: She is not ill-appearing.  HENT:     Head: Normocephalic.  Cardiovascular:     Rate and Rhythm: Normal rate.  Pulmonary:     Effort: Pulmonary effort is normal.     Breath sounds: Normal breath sounds.  Abdominal:     General: Abdomen is flat. There is no distension.     Tenderness: There is no abdominal tenderness. There is no guarding.  Musculoskeletal:     Comments: Noted bilateral pedal edema, 1+ pitting;  Also noted that pt  is in a full knee immobilizer from an apparent recent fracture.    Skin:    Capillary Refill: Capillary refill takes less than 2 seconds.     Comments: COOL AND DRY  Neurological:     Mental Status: She is alert.      ED Treatments / Results  Labs (all labs ordered are listed, but only abnormal results are displayed) Labs Reviewed  CBC WITH DIFFERENTIAL/PLATELET - Abnormal; Notable for the following components:      Result Value   RDW 16.1 (*)    All other components within normal limits  COMPREHENSIVE METABOLIC PANEL - Abnormal; Notable for the following components:   Potassium 3.2 (*)    Glucose, Bld 205 (*)    Albumin 3.1 (*)    All other components within normal limits  URINALYSIS, ROUTINE W REFLEX MICROSCOPIC - Abnormal; Notable for the following components:   Glucose, UA >=500 (*)    Hgb urine dipstick SMALL (*)    Protein, ur 100 (*)    Leukocytes,Ua SMALL (*)    All other components within normal limits  CBG MONITORING, ED - Abnormal; Notable for the following components:   Glucose-Capillary 199 (*)    All other components within normal limits  NOVEL CORONAVIRUS, NAA (HOSPITAL ORDER, SEND-OUT TO REF LAB)  CBG MONITORING, ED    EKG EKG Interpretation  Date/Time:  Friday May 18 2018 18:17:44 EDT Ventricular Rate:  71 PR Interval:    QRS Duration: 93 QT Interval:  414 QTC Calculation: 450 R Axis:   -31 Text Interpretation:  Sinus rhythm Atrial premature complexes Abnormal R-wave progression, early transition LVH with secondary repolarization abnormality No STEMI.  Confirmed by Nanda Quinton [phone]) on 05/18/2018 6:42:22 PM   Radiology Dg Chest 2 View  Result Date: 05/18/2018 CLINICAL DATA:  Altered mental status. Generalized weakness and lethargy for 3 days. EXAM: CHEST - 2 VIEW COMPARISON:  Radiograph 05/10/2018 FINDINGS: Low lung volumes. Unchanged heart size and mediastinal contours. Vague retrocardiac and right suprahilar opacity. Calcified granuloma in  the right mid lung. No large pleural effusion. No pneumothorax. No acute osseous abnormalities. IMPRESSION: Vague retrocardiac and  right suprahilar opacities, nonspecific. This may represent atelectasis, or pneumonia, including atypical viral infection. Opacities are new from exam 1 week ago. Electronically Signed   By: Keith Rake M.D.   On: 05/18/2018 19:55   Ct Head Wo Contrast  Result Date: 05/18/2018 CLINICAL DATA:  Encephalopathy. EXAM: CT HEAD WITHOUT CONTRAST TECHNIQUE: Contiguous axial images were obtained from the base of the skull through the vertex without intravenous contrast. COMPARISON:  08/10/2016 FINDINGS: Brain: No intracranial hemorrhage, mass effect, or midline shift. Stable moderate to advanced atrophy. Stable moderate to advanced chronic small vessel ischemia. No hydrocephalus. The basilar cisterns are patent. No evidence of territorial infarct or acute ischemia. No extra-axial or intracranial fluid collection. Vascular: Atherosclerosis of skullbase vasculature without hyperdense vessel or abnormal calcification. Skull: No fracture or suspicious lesion. Small frontal bony excrescence, stable from many years and considered benign. Sinuses/Orbits: Paranasal sinuses and mastoid air cells are clear. The visualized orbits are unremarkable. Bilateral cataract resection. Other: None. IMPRESSION: 1. No acute intracranial abnormality. 2. Unchanged atrophy and chronic small vessel ischemia. Electronically Signed   By: Keith Rake M.D.   On: 05/18/2018 21:19    Procedures Procedures (including critical care time)  Medications Ordered in ED Medications  azithromycin (ZITHROMAX) 500 mg in sodium chloride 0.9 % 250 mL IVPB (has no administration in time range)  cefTRIAXone (ROCEPHIN) 1 g in sodium chloride 0.9 % 100 mL IVPB (has no administration in time range)     Initial Impression / Assessment and Plan / ED Course  I have reviewed the triage vital signs and the nursing notes.   Pertinent labs & imaging results that were available during my care of the patient were reviewed by me and considered in my medical decision making (see chart for details).   I re-evaluated pt again;  She is sleeping quietly and does not appear to be in distress.  I ask her if she is having any pain and she replies "no, I feel ok".    U/A is still pending.   CT head negative.   Daughter has not been present in ED for further.    Noted atypical presentation of PNA on CXR.   With pts AMS and possible PNA, will start antibiotics and COVID 19 contact precautions.    Will consult with hospitalist for admission  Final Clinical Impressions(s) / ED Diagnoses   Final diagnoses:  Altered mental status, unspecified altered mental status type  Community acquired pneumonia of left upper lobe of lung [facility])    ED Discharge Orders    None       Fraser Busche, Imagene Riches, PA-C 05/18/18 2233    Lacretia Leigh, MD 05/19/18 [phone]

## 2018-05-18 NOTE — Progress Notes (Signed)
Pharmacy Antibiotic Note  [name] is a 78 y.o. female admitted on 05/18/2018 with pneumonia.  Pharmacy has been consulted for Vancomycin, cefepime dosing.  Plan: Vancomycin 1.75 gm iv x1, then Vancomycin 1500mg  IV Q 24 hrs. Goal AUC 400-550. Expected AUC: 483 SCr used: 0.8 (adjusted)  Cefepime 2gm iv x1, then 2gm iv q8hr   Height: 5\' 7"  (170.2 cm) Weight: 188 lb 7.9 oz (85.5 kg) IBW/kg (Calculated) : 61.6  Temp (24hrs), Avg:99.2 F (37.3 C), Min:99.2 F (37.3 C), Max:99.2 F (37.3 C)  Recent Labs  Lab 05/12/18 0257 05/13/18 0550 05/18/18 1900  WBC 8.2 5.6 6.7  CREATININE 1.17* 0.83 0.64    Estimated Creatinine Clearance: 64.1 mL/min (by C-G formula based on SCr of 0.64 mg/dL).    Allergies  Allergen Reactions  . Flounder [Fish Allergy] Other (See Comments)    Pt breaks out in bumps  . Morphine And Related Other (See Comments)    Reaction:  Hallucinations   . Oxycodone Other (See Comments)    Reaction:  Hallucinations  . Sulfonamide Derivatives Swelling and Other (See Comments)    Reaction:  Unspecified swelling reaction     Antimicrobials this admission: Vancomycin 05/18/2018 >> Cefepime 05/18/2018 >>   Dose adjustments this admission: -  Microbiology results: -  Thank you for allowing pharmacy to be a part of this patient's care.  [name] 05/18/2018 11:31 PM

## 2018-05-18 NOTE — ED Notes (Signed)
New iv placed by [facility] team

## 2018-05-18 NOTE — ED Triage Notes (Signed)
Per [facility]- Pt resides at home with home health and daughter. Pt endorse generalized weakness, urinary rentention and  Hyperglycemia. Daughter reports lethargy x 3 days. Pt cool to touch for EMS 98.8 temp. Denies N/V/D shortness of breath.

## 2018-05-19 ENCOUNTER — Encounter (HOSPITAL_COMMUNITY): Payer: Self-pay

## 2018-05-19 DIAGNOSIS — R4182 Altered mental status, unspecified: Secondary | ICD-10-CM

## 2018-05-19 DIAGNOSIS — J181 Lobar pneumonia, unspecified organism: Secondary | ICD-10-CM

## 2018-05-19 LAB — COMPREHENSIVE METABOLIC PANEL
ALT: 14 U/L (ref 0–44)
AST: 14 U/L — ABNORMAL LOW (ref 15–41)
Albumin: 2.8 g/dL — ABNORMAL LOW (ref 3.5–5.0)
Alkaline Phosphatase: 58 U/L (ref 38–126)
Anion gap: 8 (ref 5–15)
BUN: 7 mg/dL — AB (ref 8–23)
CO2: 23 mmol/L (ref 22–32)
Calcium: 8.6 mg/dL — ABNORMAL LOW (ref 8.9–10.3)
Chloride: 109 mmol/L (ref 98–111)
Creatinine, Ser: 0.54 mg/dL (ref 0.44–1.00)
GFR calc Af Amer: >60 mL/min (ref >60)
GFR calc non Af Amer: >60 mL/min (ref >60)
Glucose, Bld: 171 mg/dL — ABNORMAL HIGH (ref 70–99)
Potassium: 3.1 mmol/L — ABNORMAL LOW (ref 3.5–5.1)
Sodium: 140 mmol/L (ref 135–145)
Total Bilirubin: 0.8 mg/dL (ref 0.3–1.2)
Total Protein: 6.1 g/dL — ABNORMAL LOW (ref 6.5–8.1)

## 2018-05-19 LAB — CBC
HEMATOCRIT: 34.7 % — AB (ref 36.0–46.0)
Hemoglobin: 10.8 g/dL — ABNORMAL LOW (ref 12.0–15.0)
MCH: 26 pg (ref 26.0–34.0)
MCHC: 31.1 g/dL (ref 30.0–36.0)
MCV: 83.6 fL (ref 80.0–100.0)
Platelets: 260 10*3/uL (ref 150–400)
RBC: 4.15 MIL/uL (ref 3.87–5.11)
RDW: 16.1 % — ABNORMAL HIGH (ref 11.5–15.5)
WBC: 5.1 10*3/uL (ref 4.0–10.5)
nRBC: 0 % (ref 0.0–0.2)

## 2018-05-19 LAB — GLUCOSE, CAPILLARY
GLUCOSE-CAPILLARY: 147 mg/dL — AB (ref 70–99)
Glucose-Capillary: 159 mg/dL — ABNORMAL HIGH (ref 70–99)
Glucose-Capillary: 195 mg/dL — ABNORMAL HIGH (ref 70–99)
Glucose-Capillary: 167 mg/dL — ABNORMAL HIGH (ref 70–99)

## 2018-05-19 MED ORDER — DIVALPROEX SODIUM 125 MG PO CSDR
125.0000 mg | DELAYED_RELEASE_CAPSULE | Freq: Every evening | ORAL | Status: DC
  Administered 2018-05-19 – 2018-05-23 (×5): 125 mg via ORAL
  Filled 2018-05-19 (×6): qty 1

## 2018-05-19 MED ORDER — LORATADINE-PSEUDOEPHEDRINE ER 5-120 MG PO TB12: 1.0000 | ORAL_TABLET | Freq: Two times a day (BID) | ORAL | Status: DC | PRN

## 2018-05-19 MED ORDER — ZINC OXIDE 40 % EX OINT
1.0000 "application " | TOPICAL_OINTMENT | CUTANEOUS | Status: DC | PRN
  Filled 2018-05-19: qty 57

## 2018-05-19 MED ORDER — PSEUDOEPHEDRINE HCL ER 120 MG PO TB12
120.0000 mg | ORAL_TABLET | Freq: Two times a day (BID) | ORAL | Status: DC | PRN
  Administered 2018-05-19 – 2018-05-24 (×2): 120 mg via ORAL
  Filled 2018-05-19 (×3): qty 1

## 2018-05-19 MED ORDER — POTASSIUM CHLORIDE CRYS ER 20 MEQ PO TBCR
40.0000 meq | EXTENDED_RELEASE_TABLET | Freq: Once | ORAL | Status: AC
  Administered 2018-05-19: 40 meq via ORAL
  Filled 2018-05-19: qty 2

## 2018-05-19 MED ORDER — ASPIRIN EC 325 MG PO TBEC
325.0000 mg | DELAYED_RELEASE_TABLET | Freq: Every day | ORAL | Status: DC
  Administered 2018-05-19 – 2018-05-22 (×4): 325 mg via ORAL
  Filled 2018-05-19 (×5): qty 1

## 2018-05-19 MED ORDER — ACETAMINOPHEN 500 MG PO TABS: 500.0000 mg | ORAL_TABLET | Freq: Three times a day (TID) | ORAL | Status: DC | PRN

## 2018-05-19 MED ORDER — ENOXAPARIN SODIUM 40 MG/0.4ML ~~LOC~~ SOLN
40.0000 mg | SUBCUTANEOUS | Status: DC
  Administered 2018-05-19 – 2018-05-24 (×6): 40 mg via SUBCUTANEOUS
  Filled 2018-05-19 (×7): qty 0.4

## 2018-05-19 MED ORDER — LOSARTAN POTASSIUM 50 MG PO TABS
100.0000 mg | ORAL_TABLET | Freq: Every day | ORAL | Status: DC
  Administered 2018-05-19 – 2018-05-23 (×5): 100 mg via ORAL
  Filled 2018-05-19 (×5): qty 2

## 2018-05-19 MED ORDER — MAGNESIUM OXIDE 400 (241.3 MG) MG PO TABS
400.0000 mg | ORAL_TABLET | Freq: Every day | ORAL | Status: DC
  Administered 2018-05-19 – 2018-05-20 (×2): 400 mg via ORAL
  Filled 2018-05-19 (×2): qty 1

## 2018-05-19 MED ORDER — FLUCONAZOLE 100 MG PO TABS
200.0000 mg | ORAL_TABLET | Freq: Every day | ORAL | Status: AC
  Administered 2018-05-19 – 2018-05-21 (×3): 200 mg via ORAL
  Filled 2018-05-19 (×3): qty 2

## 2018-05-19 MED ORDER — PRAMIPEXOLE DIHYDROCHLORIDE 1 MG PO TABS
1.5000 mg | ORAL_TABLET | Freq: Three times a day (TID) | ORAL | Status: DC
  Administered 2018-05-19 – 2018-05-24 (×17): 1.5 mg via ORAL
  Filled 2018-05-19 (×21): qty 2

## 2018-05-19 MED ORDER — AMLODIPINE BESYLATE 5 MG PO TABS
5.0000 mg | ORAL_TABLET | Freq: Every day | ORAL | Status: DC
  Administered 2018-05-19 – 2018-05-23 (×5): 5 mg via ORAL
  Filled 2018-05-19 (×5): qty 1

## 2018-05-19 MED ORDER — EMPAGLIFLOZIN-LINAGLIPTIN 10-5 MG PO TABS: 1.0000 | ORAL_TABLET | Freq: Every day | ORAL | Status: DC

## 2018-05-19 MED ORDER — ONDANSETRON HCL 4 MG PO TABS: 4.0000 mg | ORAL_TABLET | Freq: Four times a day (QID) | ORAL | Status: DC | PRN

## 2018-05-19 MED ORDER — MIRTAZAPINE 15 MG PO TABS
15.0000 mg | ORAL_TABLET | Freq: Every day | ORAL | Status: DC
  Administered 2018-05-19 – 2018-05-23 (×5): 15 mg via ORAL
  Filled 2018-05-19 (×6): qty 1

## 2018-05-19 MED ORDER — INSULIN ASPART 100 UNIT/ML ~~LOC~~ SOLN
0.0000 [IU] | Freq: Three times a day (TID) | SUBCUTANEOUS | Status: DC
  Administered 2018-05-19 – 2018-05-20 (×4): 2 [IU] via SUBCUTANEOUS
  Administered 2018-05-20: 3 [IU] via SUBCUTANEOUS
  Administered 2018-05-20 – 2018-05-21 (×4): 2 [IU] via SUBCUTANEOUS
  Administered 2018-05-22: 5 [IU] via SUBCUTANEOUS
  Administered 2018-05-22 – 2018-05-23 (×2): 2 [IU] via SUBCUTANEOUS
  Administered 2018-05-23 – 2018-05-24 (×3): 3 [IU] via SUBCUTANEOUS
  Administered 2018-05-24: 1 [IU] via SUBCUTANEOUS
  Administered 2018-05-24: 2 [IU] via SUBCUTANEOUS

## 2018-05-19 MED ORDER — METOPROLOL SUCCINATE ER 50 MG PO TB24
50.0000 mg | ORAL_TABLET | Freq: Every day | ORAL | Status: DC
  Administered 2018-05-19 – 2018-05-22 (×4): 50 mg via ORAL
  Filled 2018-05-19 (×5): qty 1

## 2018-05-19 MED ORDER — ONDANSETRON HCL 4 MG/2ML IJ SOLN: 4.0000 mg | Freq: Four times a day (QID) | INTRAMUSCULAR | Status: DC | PRN

## 2018-05-19 MED ORDER — SODIUM CHLORIDE 0.9 % IV SOLN
INTRAVENOUS | Status: DC
  Administered 2018-05-19: 01:00:00 via INTRAVENOUS

## 2018-05-19 MED ORDER — INSULIN ASPART 100 UNIT/ML ~~LOC~~ SOLN
0.0000 [IU] | Freq: Every day | SUBCUTANEOUS | Status: DC
  Administered 2018-05-22 – 2018-05-23 (×2): 3 [IU] via SUBCUTANEOUS
  Administered 2018-05-24: 2 [IU] via SUBCUTANEOUS

## 2018-05-19 MED ORDER — PANTOPRAZOLE SODIUM 40 MG PO TBEC
40.0000 mg | DELAYED_RELEASE_TABLET | Freq: Every day | ORAL | Status: DC
  Administered 2018-05-19 – 2018-05-24 (×6): 40 mg via ORAL
  Filled 2018-05-19 (×6): qty 1

## 2018-05-19 MED ORDER — LORATADINE 10 MG PO TABS: 5.0000 mg | ORAL_TABLET | Freq: Two times a day (BID) | ORAL | Status: DC | PRN

## 2018-05-19 MED ORDER — ALPRAZOLAM 0.25 MG PO TABS
0.2500 mg | ORAL_TABLET | Freq: Every day | ORAL | Status: DC
  Administered 2018-05-19 – 2018-05-22 (×4): 0.25 mg via ORAL
  Filled 2018-05-19 (×4): qty 1

## 2018-05-19 MED ORDER — LINAGLIPTIN 5 MG PO TABS
5.0000 mg | ORAL_TABLET | Freq: Every day | ORAL | Status: DC
  Administered 2018-05-19 – 2018-05-24 (×6): 5 mg via ORAL
  Filled 2018-05-19 (×6): qty 1

## 2018-05-19 NOTE — Progress Notes (Signed)
Pt has urinary retention. Scanned bladder for the 3rd time, 300,450, then 592 cc of urine final scan, Foley 16 Fr placed per MD order for urinary retention. Daughter states this happens at home often causing UTI. Will cont to monitor. SRP, RN

## 2018-05-19 NOTE — Progress Notes (Signed)
PROGRESS NOTE    [name]  WER:[id number] DOB: [birth year] DOA: 05/18/2018 PCP: [name], MD  Brief [name] y.o. female with medical history significant of recent admission from March 19-23 with sepsis hypotension due to UTI with urine culture showing yeast, history of dementia, hypertension, stage IV sacral decubitus ulcer, diabetes, periprosthetic right knee fracture with bedbound status who was brought back today due to urinary retention weakness as well as altered mental status for 2 days.  Patient has had decreased p.o. intake.  She is not able to communicate adequately..  ED Course: Temperature is 99 2 blood pressure 170/108 pulse 83 respiratory rate of 26 oxygen sats 93% on room air.  White count is 6.7 hemoglobin 12.2 and platelet count 324.  Sodium is 140 potassium 3.2 the rest of the electrolytes are within normal.  Glucose is 205.  Urinalysis showed WBC and leukocyte positive but no visible bacteria.  Head CT without contrast showed no active disease.  Chest x-ray shows Retrocardiac and right suprahilar opacities questionable pneumonia.  Patient has been worked up including sample of COVID-19 taken.  She is being admitted for treatment  Assessment & Plan:   Principal Problem:   Pneumonia Active Problems:   HYPERCHOLESTEROLEMIA   ANXIETY DEPRESSION   Essential hypertension   Spinal stenosis, lumbar region, with neurogenic claudication   UTI (urinary tract infection)   Dementia ([place])   DM type 2 (diabetes mellitus, type 2) ([facility])  #1.  Healthcare associated pneumonia: Patient just left the hospital and is back with findings suggestive of pneumonia.  We will treat her with regimen for healthcare associated pneumonia.  Also low risk for [facility] COVID-19 which is going to be tested.    MRSA PCR positive.  On vancomycin and cefepime.  Patient is doing well on room air saturation above 93% she is afebrile.  #2 UTI with urinary retention 40,000 colonies of yeast noted.   Patient on Diflucan at the time of admission.  Unclear how long she has been on it.  However with a new retention and urine culture will give her Diflucan 200 mg daily for 3 days.  Bladder scan.  #3 dementia: Advanced dementia.  Continue supportive care  #4 diabetes: Continue with sliding scale insulin and hold metformin.  #5 hypertension: Continue blood pressure control.  #6 anxiety with depression: Continue home regimen and monitor closely.  #7 stage IV sacral decubitus ulcer: Continue wound care.  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  8 hypokalemia replete.   DVT prophylaxis: Lovenox Code Status: DNR Family Communication:  DW DAUGHTER Hollis Disposition Plan: To be determined Consults called: None   Pressure Injury 05/10/18 Stage II -  Partial thickness loss of dermis presenting as a shallow open ulcer with a red, pink wound bed without slough. (Active)  05/10/18 1900  Location: Sacrum  Location Orientation: Right;Left  Staging: Stage II -  Partial thickness loss of dermis presenting as a shallow open ulcer with a red, pink wound bed without slough.  Wound Description (Comments):   Present on Admission: Yes    Estimated body mass index is 29.52 kg/m as calculated from the following:   Height as of this encounter: 5\' 7"  (1.702 m).   Weight as of this encounter: 85.5 kg.    Subjective: Resting in bed in no acute distress she was sleeping when I walked into the room I was able to wake her up she appeared comfortable she asked for something to eat and drink denied any chest  pain shortness of breath staff reports overnight she had difficulty passing urine they had to in and out her twice with over 400 cc of urine.  Objective: Vitals:   05/18/18 2231 05/18/18 2345 05/19/18 0032 05/19/18 0504  BP: (!) 155/95 (!) 150/90 140/84 (!) 144/91  Pulse: 75 76 68 63  Resp: (!) 22 20 18 16   Temp:  99 F (37.2 C) 98 F (36.7 C) 97.6 F (36.4 C)  TempSrc:  Oral  Oral Oral  SpO2: 98% 100% 99% 96%  Weight:      Height:        Intake/Output Summary (Last 24 hours) at 05/19/2018 1137 Last data filed at 05/19/2018 0600 Gross per 24 hour  Intake 650 ml  Output 850 ml  Net -200 ml   Filed Weights   05/18/18 1828  Weight: 85.5 kg    Examination:  General exam: Appears calm and comfortable  Respiratory system: Clear to auscultation. Respiratory effort normal. Cardiovascular system: S1 & S2 heard, RRR. No JVD, murmurs, rubs, gallops or clicks. No pedal edema. Gastrointestinal system: Abdomen is nondistended, soft and nontender. No organomegaly or masses felt. Normal bowel sounds heard. Central nervous system awake oriented to hospital answered all my questions appropriately. Extremities: Symmetric 5 x 5 power. Skin: No rashes, lesions or ulcers  Data Reviewed: I have personally reviewed following labs and imaging studies  CBC: Recent Labs  Lab 05/13/18 0550 05/18/18 1900 05/19/18 0723  WBC 5.6 6.7 5.1  NEUTROABS 4.2 4.5  --   HGB 9.9* 12.2 10.8*  HCT 33.5* 39.8 34.7*  MCV 89.3 85.2 83.6  PLT 108* 324 601   Basic Metabolic Panel: Recent Labs  Lab 05/13/18 0550 05/18/18 1900 05/19/18 0723  NA 148* 140 140  K 3.2* 3.2* 3.1*  CL 121* 107 109  CO2 18* 24 23  GLUCOSE 116* 205* 171*  BUN 30* 8 7*  CREATININE 0.83 0.64 0.54  CALCIUM 8.2* 9.1 8.6*   GFR: Estimated Creatinine Clearance: 64.1 mL/min (by C-G formula based on SCr of 0.54 mg/dL). Liver Function Tests: Recent Labs  Lab 05/18/18 1900 05/19/18 0723  AST 15 14*  ALT 16 14  ALKPHOS 71 58  BILITOT 0.4 0.8  PROT 7.0 6.1*  ALBUMIN 3.1* 2.8*   No results for input(s): LIPASE, AMYLASE in the last 168 hours. No results for input(s): AMMONIA in the last 168 hours. Coagulation Profile: No results for input(s): INR, PROTIME in the last 168 hours. Cardiac Enzymes: No results for input(s): CKTOTAL, CKMB, CKMBINDEX, TROPONINI in the last 168 hours. BNP (last 3 results) No  results for input(s): PROBNP in the last 8760 hours. HbA1C: No results for input(s): HGBA1C in the last 72 hours. CBG: Recent Labs  Lab 05/13/18 2050 05/14/18 0725 05/14/18 1114 05/18/18 1821 05/19/18 0801  GLUCAP 152* 92 148* 199* 159*   Lipid Profile: No results for input(s): CHOL, HDL, LDLCALC, TRIG, CHOLHDL, LDLDIRECT in the last 72 hours. Thyroid Function Tests: No results for input(s): TSH, T4TOTAL, FREET4, T3FREE, THYROIDAB in the last 72 hours. Anemia Panel: No results for input(s): VITAMINB12, FOLATE, FERRITIN, TIBC, IRON, RETICCTPCT in the last 72 hours. Sepsis Labs: No results for input(s): PROCALCITON, LATICACIDVEN in the last 168 hours.  Recent Results (from the past 240 hour(s))  Culture, blood (Routine x 2)     Status: None   Collection Time: 05/10/18  7:15 AM  Result Value Ref Range Status   Specimen Description   Final    BLOOD LEFT ANTECUBITAL Performed  at [facility], [place] [address] [place], [place] [address]    Special Requests   Final    BOTTLES DRAWN AEROBIC AND ANAEROBIC Blood Culture results may not be optimal due to an excessive volume of blood received in culture bottles Performed at [facility] [address] [place], [place] [address]    Culture   Final    NO GROWTH 5 DAYS Performed at [facility], [place] [address] [place], [place] [address]    Report Status 05/15/2018 FINAL  Final  Culture, Urine     Status: Abnormal   Collection Time: 05/10/18 12:15 PM  Result Value Ref Range Status   Specimen Description   Final    URINE, RANDOM Performed at [place] [address], [place], [place] [address]    Special Requests   Final    NONE Performed at [facility], [place] [address] [place], [place] [address]    Culture 40,000 COLONIES/mL YEAST (A)  Final   Report Status 05/11/2018 FINAL  Final  Culture, blood (Routine x 2)     Status: None   Collection  Time: 05/10/18  8:05 PM  Result Value Ref Range Status   Specimen Description   Final    BLOOD LEFT HAND Performed at [facility] [address], [place], [place] [address]    Special Requests   Final    BOTTLES DRAWN AEROBIC AND ANAEROBIC Blood Culture adequate volume Performed at [facility] [address] [place], [place] [address]    Culture   Final    NO GROWTH 5 DAYS Performed at [facility], [place] [address], [place], [place] [address]    Report Status 05/15/2018 FINAL  Final  MRSA PCR Screening     Status: Abnormal   Collection Time: 05/11/18 12:26 AM  Result Value Ref Range Status   MRSA by PCR POSITIVE (A) NEGATIVE Final    Comment:        The GeneXpert MRSA Assay (FDA approved for NASAL specimens only), is one component of a comprehensive MRSA colonization surveillance program. It is not intended to diagnose MRSA infection nor to guide or monitor treatment for MRSA infections. CRITICAL RESULT CALLED TO, READ BACK BY AND VERIFIED WITH: RN ISABELLA AT 7867 05/11/18 [place] A Performed at [facility], [place] [address], [place], [place] [address]          Radiology Studies: Dg Chest 2 View  Result Date: 05/18/2018 CLINICAL DATA:  Altered mental status. Generalized weakness and lethargy for 3 days. EXAM: CHEST - 2 VIEW COMPARISON:  Radiograph 05/10/2018 FINDINGS: Low lung volumes. Unchanged heart size and mediastinal contours. Vague retrocardiac and right suprahilar opacity. Calcified granuloma in the right mid lung. No large pleural effusion. No pneumothorax. No acute osseous abnormalities. IMPRESSION: Vague retrocardiac and right suprahilar opacities, nonspecific. This may represent atelectasis, or pneumonia, including atypical viral infection. Opacities are new from exam 1 week ago. Electronically Signed   By: Keith Rake M.D.   On: 05/18/2018 19:55   Ct Head Wo Contrast  Result Date:  05/18/2018 CLINICAL DATA:  Encephalopathy. EXAM: CT HEAD WITHOUT CONTRAST TECHNIQUE: Contiguous axial images were obtained from the base of the skull through the vertex without intravenous contrast. COMPARISON:  08/10/2016 FINDINGS: Brain: No intracranial hemorrhage, mass effect, or midline shift. Stable moderate to advanced atrophy. Stable moderate to advanced chronic small vessel ischemia. No hydrocephalus. The basilar cisterns  are patent. No evidence of territorial infarct or acute ischemia. No extra-axial or intracranial fluid collection. Vascular: Atherosclerosis of skullbase vasculature without hyperdense vessel or abnormal calcification. Skull: No fracture or suspicious lesion. Small frontal bony excrescence, stable from many years and considered benign. Sinuses/Orbits: Paranasal sinuses and mastoid air cells are clear. The visualized orbits are unremarkable. Bilateral cataract resection. Other: None. IMPRESSION: 1. No acute intracranial abnormality. 2. Unchanged atrophy and chronic small vessel ischemia. Electronically Signed   By: Keith Rake M.D.   On: 05/18/2018 21:19        Scheduled Meds: . ALPRAZolam  0.25 mg Oral QHS  . amLODipine  5 mg Oral Daily  . aspirin  325 mg Oral Daily  . divalproex  125 mg Oral QPM  . enoxaparin (LOVENOX) injection  40 mg Subcutaneous Q24H  . insulin aspart  0-5 Units Subcutaneous QHS  . insulin aspart  0-9 Units Subcutaneous TID WC  . linagliptin  5 mg Oral Daily  . losartan  100 mg Oral Daily  . magnesium oxide  400 mg Oral Daily  . metoprolol succinate  50 mg Oral Daily  . mirtazapine  15 mg Oral QHS  . pantoprazole  40 mg Oral Daily  . pramipexole  1.5 mg Oral TID   Continuous Infusions: . sodium chloride 75 mL/hr at 05/19/18 0100  . ceFEPime (MAXIPIME) IV    . ceFEPime (MAXIPIME) IV 2 g (05/19/18 0718)  . vancomycin       LOS: 1 day     Georgette Shell, MD [facility]  If 7PM-7AM, please contact night-coverage  [email or web address] Password [facility] 05/19/2018, 11:37 AM

## 2018-05-19 NOTE — Progress Notes (Signed)
Provider notified RE pt has not voided since arrival to floor. Bladder scan = 314ml. Provider ordered in and out cath.  Completed= 452ml.  Pt tolerated well. In no acute distress. Will CTM

## 2018-05-19 NOTE — ED Notes (Signed)
ED TO INPATIENT HANDOFF REPORT  ED Nurse Name and Phone #: jon wled   S Name/Age/Gender [name] [name] 78 y.o. female Room/Bed: WA19/WA19  Code Status   Code Status: Prior  Home/SNF/Other Home Patient oriented to: self and situation Is this baseline? No   Triage Complete: Triage complete  Chief Complaint Hyperglycemia/Gen. Weakness  Triage Note Per [facility]- Pt resides at home with home health and daughter. Pt endorse generalized weakness, urinary rentention and  Hyperglycemia. Daughter reports lethargy x 3 days. Pt cool to touch for EMS 98.8 temp. Denies N/V/D shortness of breath.    Allergies Allergies  Allergen Reactions  . Flounder [Fish Allergy] Other (See Comments)    Pt breaks out in bumps  . Morphine And Related Other (See Comments)    Reaction:  Hallucinations   . Oxycodone Other (See Comments)    Reaction:  Hallucinations  . Sulfonamide Derivatives Swelling and Other (See Comments)    Reaction:  Unspecified swelling reaction     Level of Care/Admitting Diagnosis ED Disposition    ED Disposition Condition Comment   Admit  Hospital Area: [place] [[id number]]  Level of Care: Med-Surg [16]  Diagnosis: Pneumonia [[id number]]  Admitting Physician: Elwyn Reach [[id number]]  Attending Physician: Elwyn Reach [[id number]]  Estimated length of stay: past midnight tomorrow  Certification:: I certify this patient will need inpatient services for at least 2 midnights  Bed request comments: covid low risk  PT Class (Do Not Modify): Inpatient [101]  PT Acc Code (Do Not Modify): Private [1]       B Medical/Surgery History Past Medical History:  Diagnosis Date  . Anxiety   . Depression   . Diabetes mellitus   . Dyspnea   . GERD (gastroesophageal reflux disease)   . Headache(784.0)   . HTN (hypertension)    dr Johnsie Cancel  . Hypercholesteremia    DENIES  . Osteoarthritis   . PONV (postoperative nausea and vomiting)   . RLS (restless legs  syndrome)   . Stress    Past Surgical History:  Procedure Laterality Date  . ABDOMINAL HYSTERECTOMY    . COLONOSCOPY    . JOINT REPLACEMENT    . left forearm fracture with ORIF Left   . LUMBAR LAMINECTOMY/DECOMPRESSION MICRODISCECTOMY N/A 04/18/2012   Procedure: LUMBAR LAMINECTOMY/DECOMPRESSION MICRODISCECTOMY 3 LEVELS;  Surgeon: Winfield Cunas, MD;  Location: [facility];  Service: Neurosurgery;  Laterality: N/A;  Lumbar three-four, lumbar four-five, lumbar five-sacral one decompression. Synovial cyst resection lumbar four-five  . ORIF PERIPROSTHETIC FRACTURE Right 07/06/2016   Procedure: OPEN REDUCTION INTERNAL FIXATION (ORIF) PERIPROSTHETIC FRACTURE;  Surgeon: Gaynelle Arabian, MD;  Location: [facility];  Service: Orthopedics;  Laterality: Right;  . REPLACEMENT TOTAL KNEE Right   . WISDOM TOOTH EXTRACTION       A IV Location/Drains/Wounds Patient Lines/Drains/Airways Status   Active Line/Drains/Airways    Name:   Placement date:   Placement time:   Site:   Days:   Peripheral IV 05/18/18 Left Antecubital   05/18/18    1901    Antecubital   1   Peripheral IV 05/18/18 Right;Posterior Hand   05/18/18    2334    Hand   1   Incision (Closed) 08/11/16 Knee Right   08/11/16    0305     646   Pressure Injury 05/10/18 Stage II -  Partial thickness loss of dermis presenting as a shallow open ulcer with a red, pink wound bed without slough.  05/10/18    1900     9          Intake/Output Last 24 hours  Intake/Output Summary (Last 24 hours) at 05/19/2018 0006 Last data filed at 05/18/2018 2142 Gross per 24 hour  Intake -  Output 450 ml  Net -450 ml    Labs/Imaging Results for orders placed or performed during the hospital encounter of 05/18/18 (from the past 48 hour(s))  CBG monitoring, ED     Status: Abnormal   Collection Time: 05/18/18  6:21 PM  Result Value Ref Range   Glucose-Capillary 199 (H) 70 - 99 mg/dL  CBC with Differential     Status: Abnormal   Collection Time: 05/18/18  7:00  PM  Result Value Ref Range   WBC 6.7 4.0 - 10.5 K/uL   RBC 4.67 3.87 - 5.11 MIL/uL   Hemoglobin 12.2 12.0 - 15.0 g/dL   HCT 39.8 36.0 - 46.0 %   MCV 85.2 80.0 - 100.0 fL   MCH 26.1 26.0 - 34.0 pg   MCHC 30.7 30.0 - 36.0 g/dL   RDW 16.1 (H) 11.5 - 15.5 %   Platelets 324 150 - 400 K/uL   nRBC 0.0 0.0 - 0.2 %   Neutrophils Relative % 66 %   Neutro Abs 4.5 1.7 - 7.7 K/uL   Lymphocytes Relative 24 %   Lymphs Abs 1.6 0.7 - 4.0 K/uL   Monocytes Relative 7 %   Monocytes Absolute 0.4 0.1 - 1.0 K/uL   Eosinophils Relative 2 %   Eosinophils Absolute 0.1 0.0 - 0.5 K/uL   Basophils Relative 0 %   Basophils Absolute 0.0 0.0 - 0.1 K/uL   Immature Granulocytes 1 %   Abs Immature Granulocytes 0.04 0.00 - 0.07 K/uL    Comment: Performed at [facility], [place] [address], [place], [place] [address]  Comprehensive metabolic panel     Status: Abnormal   Collection Time: 05/18/18  7:00 PM  Result Value Ref Range   Sodium 140 135 - 145 mmol/L   Potassium 3.2 (L) 3.5 - 5.1 mmol/L   Chloride 107 98 - 111 mmol/L   CO2 24 22 - 32 mmol/L   Glucose, Bld 205 (H) 70 - 99 mg/dL   BUN 8 8 - 23 mg/dL   Creatinine, Ser 0.64 0.44 - 1.00 mg/dL   Calcium 9.1 8.9 - 10.3 mg/dL   Total Protein 7.0 6.5 - 8.1 g/dL   Albumin 3.1 (L) 3.5 - 5.0 g/dL   AST 15 15 - 41 U/L   ALT 16 0 - 44 U/L   Alkaline Phosphatase 71 38 - 126 U/L   Total Bilirubin 0.4 0.3 - 1.2 mg/dL   GFR calc non Af Amer >60 >60 mL/min   GFR calc Af Amer >60 >60 mL/min   Anion gap 9 5 - 15    Comment: Performed at [facility], [place] [address] [place], [place] [address]  Urinalysis, Routine w reflex microscopic     Status: Abnormal   Collection Time: 05/18/18  7:00 PM  Result Value Ref Range   Color, Urine YELLOW YELLOW   APPearance CLEAR CLEAR   Specific Gravity, Urine 1.010 1.005 - 1.030   pH 6.0 5.0 - 8.0   Glucose, UA >=500 (A) NEGATIVE mg/dL   Hgb urine dipstick SMALL (A) NEGATIVE   Bilirubin Urine  NEGATIVE NEGATIVE   Ketones, ur NEGATIVE NEGATIVE mg/dL   Protein, ur 100 (A) NEGATIVE mg/dL   Nitrite  NEGATIVE NEGATIVE   Leukocytes,Ua SMALL (A) NEGATIVE   RBC / HPF 0-5 0 - 5 RBC/hpf   WBC, UA 21-50 0 - 5 WBC/hpf   Bacteria, UA NONE SEEN NONE SEEN    Comment: Performed at [facility], [place] [address] [place], [place] [address]   Dg Chest 2 View  Result Date: 05/18/2018 CLINICAL DATA:  Altered mental status. Generalized weakness and lethargy for 3 days. EXAM: CHEST - 2 VIEW COMPARISON:  Radiograph 05/10/2018 FINDINGS: Low lung volumes. Unchanged heart size and mediastinal contours. Vague retrocardiac and right suprahilar opacity. Calcified granuloma in the right mid lung. No large pleural effusion. No pneumothorax. No acute osseous abnormalities. IMPRESSION: Vague retrocardiac and right suprahilar opacities, nonspecific. This may represent atelectasis, or pneumonia, including atypical viral infection. Opacities are new from exam 1 week ago. Electronically Signed   By: Keith Rake M.D.   On: 05/18/2018 19:55   Ct Head Wo Contrast  Result Date: 05/18/2018 CLINICAL DATA:  Encephalopathy. EXAM: CT HEAD WITHOUT CONTRAST TECHNIQUE: Contiguous axial images were obtained from the base of the skull through the vertex without intravenous contrast. COMPARISON:  08/10/2016 FINDINGS: Brain: No intracranial hemorrhage, mass effect, or midline shift. Stable moderate to advanced atrophy. Stable moderate to advanced chronic small vessel ischemia. No hydrocephalus. The basilar cisterns are patent. No evidence of territorial infarct or acute ischemia. No extra-axial or intracranial fluid collection. Vascular: Atherosclerosis of skullbase vasculature without hyperdense vessel or abnormal calcification. Skull: No fracture or suspicious lesion. Small frontal bony excrescence, stable from many years and considered benign. Sinuses/Orbits: Paranasal sinuses and mastoid air cells are clear. The  visualized orbits are unremarkable. Bilateral cataract resection. Other: None. IMPRESSION: 1. No acute intracranial abnormality. 2. Unchanged atrophy and chronic small vessel ischemia. Electronically Signed   By: Keith Rake M.D.   On: 05/18/2018 21:19    Pending Labs Unresulted Labs (From admission, onward)    Start     Ordered   05/18/18 2133  Novel Coronavirus, NAA (hospital order; send-out to ref lab)  (Novel Coronavirus, NAA [facility] Order; send-out to ref lab) with precautions panel)  Once,   R    Question Answer Comment  Current symptoms Fever and Shortness of breath   Excluded other viral illnesses No (testing not indicated)   Patient immune status Normal      05/18/18 2133   Signed and Held  CBC  (enoxaparin (LOVENOX)    CrCl >/= 30 ml/min)  Once,   R    Comments:  Baseline for enoxaparin therapy IF NOT ALREADY DRAWN.  Notify MD if PLT < 100 K.    Signed and Held   Signed and Held  Creatinine, serum  (enoxaparin (LOVENOX)    CrCl >/= 30 ml/min)  Once,   R    Comments:  Baseline for enoxaparin therapy IF NOT ALREADY DRAWN.    Signed and Held   Signed and Held  Creatinine, serum  (enoxaparin (LOVENOX)    CrCl >/= 30 ml/min)  Weekly,   R    Comments:  while on enoxaparin therapy    Signed and Held   Signed and Held  Comprehensive metabolic panel  Tomorrow morning,   R     Signed and Held   Signed and Held  CBC  Tomorrow morning,   R     Signed and Held          Vitals/Pain Today's Vitals   05/18/18 2143 05/18/18 2230 05/18/18 2231 05/18/18 2345  BP:  116/65 (!) 155/95 (!) 155/95 (!) 150/90  Pulse: 76 74 75 76  Resp: (!) 26 (!) 21 (!) 22 20  Temp:    99 F (37.2 C)  TempSrc:    Oral  SpO2: 96% 97% 98% 100%  Weight:      Height:      PainSc:    0-No pain    Isolation Precautions Droplet and Contact precautions  Medications Medications  azithromycin (ZITHROMAX) 500 mg in sodium chloride 0.9 % 250 mL IVPB (has no administration in time range)  ceFEPIme  (MAXIPIME) 2 g in sodium chloride 0.9 % 100 mL IVPB (has no administration in time range)  vancomycin (VANCOCIN) 1,750 mg in sodium chloride 0.9 % 500 mL IVPB (has no administration in time range)  vancomycin (VANCOCIN) 1,500 mg in sodium chloride 0.9 % 500 mL IVPB (has no administration in time range)  ceFEPIme (MAXIPIME) 2 g in sodium chloride 0.9 % 100 mL IVPB (has no administration in time range)  cefTRIAXone (ROCEPHIN) 1 g in sodium chloride 0.9 % 100 mL IVPB ( Intravenous Restarted 05/18/18 2333)    Mobility non-ambulatory High fall risk   Focused Assessment    Recommendations: See Admitting Provider Note  Report given to:   Additional Notes:  Tested for covid droplet precautions

## 2018-05-19 NOTE — Progress Notes (Signed)
MEDICATION RELATED CONSULT NOTE - INITIAL   Pharmacy Consult for Glyxambi (emagliflozin component) Indication: DM  Allergies  Allergen Reactions  . Flounder [Fish Allergy] Other (See Comments)    Pt breaks out in bumps  . Morphine And Related Other (See Comments)    Reaction:  Hallucinations   . Oxycodone Other (See Comments)    Reaction:  Hallucinations  . Sulfonamide Derivatives Swelling and Other (See Comments)    Reaction:  Unspecified swelling reaction     Patient Measurements: Height: 5\' 7"  (170.2 cm) Weight: 188 lb 7.9 oz (85.5 kg) IBW/kg (Calculated) : 61.6 Adjusted Body Weight:   Vital Signs: Temp: 98 F (36.7 C) (03/28 0032) Temp Source: Oral (03/28 0032) BP: 140/84 (03/28 0032) Pulse Rate: 68 (03/28 0032) Intake/Output from previous day: 03/27 0701 - 03/28 0700 In: -  Out: 450 [Urine:450] Intake/Output from this shift: Total I/O In: -  Out: 450 [Urine:450]  Labs: Recent Labs    05/18/18 1900  WBC 6.7  HGB 12.2  HCT 39.8  PLT 324  CREATININE 0.64  ALBUMIN 3.1*  PROT 7.0  AST 15  ALT 16  ALKPHOS 71  BILITOT 0.4   Estimated Creatinine Clearance: 64.1 mL/min (by C-G formula based on SCr of 0.64 mg/dL).   Microbiology: Recent Results (from the past 720 hour(s))  Culture, blood (Routine x 2)     Status: None   Collection Time: 05/10/18  7:15 AM  Result Value Ref Range Status   Specimen Description   Final    BLOOD LEFT ANTECUBITAL Performed at [facility] [address], [place], [place] [address]    Special Requests   Final    BOTTLES DRAWN AEROBIC AND ANAEROBIC Blood Culture results may not be optimal due to an excessive volume of blood received in culture bottles Performed at [facility] [address] [place], [place] [address]    Culture   Final    NO GROWTH 5 DAYS Performed at [facility], [place] [address] [place], [place] [address]    Report Status 05/15/2018 FINAL  Final  Culture,  Urine     Status: Abnormal   Collection Time: 05/10/18 12:15 PM  Result Value Ref Range Status   Specimen Description   Final    URINE, RANDOM Performed at [facility] [address], [place], [place] [address]    Special Requests   Final    NONE Performed at [facility], [place] [address] [place], [place] [address]    Culture 40,000 COLONIES/mL YEAST (A)  Final   Report Status 05/11/2018 FINAL  Final  Culture, blood (Routine x 2)     Status: None   Collection Time: 05/10/18  8:05 PM  Result Value Ref Range Status   Specimen Description   Final    BLOOD LEFT HAND Performed at [facility] [address], [place], [place] [address]    Special Requests   Final    BOTTLES DRAWN AEROBIC AND ANAEROBIC Blood Culture adequate volume Performed at [facility] [address], [place], [place] [address]    Culture   Final    NO GROWTH 5 DAYS Performed at [facility], [place] [address] [place], [place] [address]    Report Status 05/15/2018 FINAL  Final  MRSA PCR Screening     Status: Abnormal   Collection Time: 05/11/18 12:26 AM  Result Value Ref Range Status   MRSA by  PCR POSITIVE (A) NEGATIVE Final    Comment:        The GeneXpert MRSA Assay (FDA approved for NASAL specimens only), is one component of a comprehensive MRSA colonization surveillance program. It is not intended to diagnose MRSA infection nor to guide or monitor treatment for MRSA infections. CRITICAL RESULT CALLED TO, READ BACK BY AND VERIFIED WITH: RN ISABELLA AT 2025 05/11/18 [place] A Performed at [facility], [place] [address], [place], [place] [address]     Medical History: Past Medical History:  Diagnosis Date  . Anxiety   . Depression   . Diabetes mellitus   . Dyspnea   . GERD (gastroesophageal reflux disease)   . Headache(784.0)   . HTN (hypertension)    dr Johnsie Cancel  . Hypercholesteremia     DENIES  . Osteoarthritis   . PONV (postoperative nausea and vomiting)   . RLS (restless legs syndrome)   . Stress     Medications:  Medications Prior to Admission  Medication Sig Dispense Refill Last Dose  . acetaminophen (TYLENOL) 500 MG tablet Take 500 mg by mouth every 8 (eight) hours as needed for mild pain.   Past Week at Unknown time  . ALPRAZolam (XANAX) 0.25 MG tablet Take 1 tablet (0.25 mg total) by mouth at bedtime as needed for anxiety. (Patient taking differently: Take 0.25 mg by mouth at bedtime. ) 2 tablet 0 05/18/2018 at Unknown time  . amLODipine (NORVASC) 5 MG tablet Take 1 tablet (5 mg total) by mouth daily. 30 tablet 0 05/18/2018 at Unknown time  . aspirin 325 MG EC tablet Take 325 mg by mouth daily.   05/18/2018 at Unknown time  . divalproex (DEPAKOTE SPRINKLE) 125 MG capsule Take 125 mg by mouth every evening.    05/18/2018 at Unknown time  . fluconazole (DIFLUCAN) 100 MG tablet Take 1 tablet (100 mg total) by mouth daily. 4 tablet 0 05/18/2018 at Unknown time  . Infant Care Products (DERMACLOUD) CREA Apply 1 application topically as needed (each change).   Past Week at Unknown time  . loratadine-pseudoephedrine (CLARITIN-D 12-HOUR) 5-120 MG tablet Take 1 tablet by mouth 2 (two) times daily as needed for allergies.   Past Month at Unknown time  . losartan (COZAAR) 25 MG tablet Take 100 mg by mouth daily.   05/18/2018 at Unknown time  . Magnesium Oxide 500 MG TABS Take 500 mg by mouth daily.    05/18/2018 at Unknown time  . metFORMIN (GLUCOPHAGE) 500 MG tablet Take 500 mg by mouth 2 (two) times daily with a meal.   05/18/2018 at Unknown time  . metoprolol succinate (TOPROL-XL) 50 MG 24 hr tablet Take 1 tablet (50 mg total) by mouth daily. 30 tablet 0 05/18/2018 at 0830  . mirtazapine (REMERON) 15 MG tablet Take 15 mg by mouth at bedtime.   05/18/2018 at Unknown time  . omeprazole (PRILOSEC) 40 MG capsule Take 40 mg by mouth daily.   05/18/2018 at Unknown time  . ondansetron (ZOFRAN)  4 MG tablet Take 4 mg by mouth every 6 (six) hours as needed for nausea/vomiting.   Past Week at Unknown time  . pramipexole (MIRAPEX) 1.5 MG tablet Take 1.5 mg by mouth 3 (three) times daily.   05/18/2018 at Unknown time  . GLYXAMBI 10-5 MG TABS Take 1 tablet by mouth daily.   Not Taking at Unknown time  . insulin detemir (LEVEMIR) 100 UNIT/ML injection Inject 0.1 mLs (10 Units total) into the skin 2 (two) times  daily. (Patient not taking: Reported on 05/18/2018) 10 mL 11 Not Taking at Unknown time    Assessment: Glyxambi (emagliflozin component) is the held when patient has UTI.  MD notes UTI on H&P  Goal of Therapy:  Safe and effective use of Glyxambi (emagliflozin component)  Plan:  D/c Glyxambi (emagliflozin component)   Tyler Deis, Shea Stakes [place] 05/19/2018,1:05 AM

## 2018-05-20 LAB — MAGNESIUM: Magnesium: 1.9 mg/dL (ref 1.7–2.4)

## 2018-05-20 LAB — GLUCOSE, CAPILLARY
GLUCOSE-CAPILLARY: 155 mg/dL — AB (ref 70–99)
GLUCOSE-CAPILLARY: 202 mg/dL — AB (ref 70–99)
Glucose-Capillary: 153 mg/dL — ABNORMAL HIGH (ref 70–99)
Glucose-Capillary: 158 mg/dL — ABNORMAL HIGH (ref 70–99)

## 2018-05-20 LAB — BASIC METABOLIC PANEL
Anion gap: 9 (ref 5–15)
BUN: 8 mg/dL (ref 8–23)
CO2: 23 mmol/L (ref 22–32)
Calcium: 9.2 mg/dL (ref 8.9–10.3)
Chloride: 108 mmol/L (ref 98–111)
Creatinine, Ser: 0.57 mg/dL (ref 0.44–1.00)
GFR calc Af Amer: >60 mL/min (ref >60)
GFR calc non Af Amer: >60 mL/min (ref >60)
Glucose, Bld: 156 mg/dL — ABNORMAL HIGH (ref 70–99)
Potassium: 3.9 mmol/L (ref 3.5–5.1)
Sodium: 140 mmol/L (ref 135–145)

## 2018-05-20 MED ORDER — POTASSIUM CHLORIDE CRYS ER 20 MEQ PO TBCR
40.0000 meq | EXTENDED_RELEASE_TABLET | Freq: Once | ORAL | Status: AC
  Administered 2018-05-20: 40 meq via ORAL
  Filled 2018-05-20: qty 2

## 2018-05-20 MED ORDER — MAGNESIUM OXIDE 400 (241.3 MG) MG PO TABS
800.0000 mg | ORAL_TABLET | Freq: Every day | ORAL | Status: DC
  Administered 2018-05-20 – 2018-05-24 (×5): 800 mg via ORAL
  Filled 2018-05-20 (×5): qty 2

## 2018-05-20 NOTE — Plan of Care (Signed)
  Problem: Clinical Measurements: ?Goal: Respiratory complications will improve ?Outcome: Progressing ?  ?Problem: Nutrition: ?Goal: Adequate nutrition will be maintained ?Outcome: Progressing ?  ?Problem: Pain Managment: ?Goal: General experience of comfort will improve ?Outcome: Progressing ?  ?Problem: Safety: ?Goal: Ability to remain free from injury will improve ?Outcome: Progressing ?  ?

## 2018-05-20 NOTE — Progress Notes (Signed)
PROGRESS NOTE    [name]  OVF:[id number] DOB: 11-14-1940 DOA: 05/18/2018 PCP: [name], MD   Brief Narrative: 78 y.o.femalewith medical history significant ofrecent admission from March 19-23 with sepsis hypotension due to UTI with urine culture showing yeast, history of dementia, hypertension, stage IV sacral decubitus ulcer, diabetes, periprosthetic right knee fracture with bedbound status who was brought back today due to urinary retention weakness as well as altered mental status for 2 days. Patient has had decreased p.o. intake. She is not able to communicate adequately..  ED Course:Temperature is99 2 blood pressure 170/108 pulse 83 respiratory rate of 26 oxygen sats 93% on room air. White count is 6.7 hemoglobin 12.2 and platelet count 324. Sodium is 140 potassium 3.2 the rest of the electrolytes are within normal. Glucose is 205. Urinalysis showed WBC and leukocyte positive but no visible bacteria. Head CT without contrast showed no active disease. Chest x-ray shows Retrocardiac and right suprahilar opacities questionable pneumonia.Patient has been worked up including sample of COVID-19 taken. She is being admitted for treatment  Assessment & Plan:   Principal Problem:   Community acquired pneumonia of left upper lobe of lung ([place]) Active Problems:   HYPERCHOLESTEROLEMIA   ANXIETY DEPRESSION   Essential hypertension   Spinal stenosis, lumbar region, with neurogenic claudication   UTI (urinary tract infection)   Dementia ([facility])   DM type 2 (diabetes mellitus, type 2) ([place])   Altered mental status  #1. Healthcare associated pneumonia:Patient just left the hospital and is back with findings suggestive of pneumonia. We will treat her with regimen for healthcare associated pneumonia. Also low risk for NobelCOVID-19 which is going to be tested.   MRSA PCR positive.  On vancomycin and cefepime.  Patient is doing well on room air saturation above 93% she  is afebrile.  #2 UTI with urinary retention 40,000 colonies of yeast noted.  Patient on Diflucan at the time of admission.  Unclear how long she has been on it.  However with a new retention and urine culture will give her Diflucan 200 mg daily for 3 days.  Bladder scan more than 400 cc Foley placed 05/19/2018.  #3 dementia:Advanced dementia. Continue supportive care  #4 diabetes:Continue with sliding scale insulin and hold metformin.  #5 hypertension:Continue blood pressure control.  #6 anxiety with depression:Continue home regimen and monitor closely.  #7 stage IV sacral decubitus ulcer:Continue wound care. 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  8 hypokalemia replete.  DVT prophylaxis:Lovenox Code Status:DNR Family Communication: DW DAUGHTER [place] Disposition Plan:To be determined Consults called:None  Pressure Injury 05/10/18 Stage II -  Partial thickness loss of dermis presenting as a shallow open ulcer with a red, pink wound bed without slough. (Active)  05/10/18 1900  Location: Sacrum  Location Orientation: Right;Left  Staging: Stage II -  Partial thickness loss of dermis presenting as a shallow open ulcer with a red, pink wound bed without slough.  Wound Description (Comments):   Present on Admission: Yes       Estimated body mass index is 29.52 kg/m as calculated from the following:   Height as of this encounter: 5\' 7"  (1.702 m).   Weight as of this encounter: 85.5 kg.   Subjective: Patient resting in bed reports her breathing is better cough better had urinary retention overnight over 400 cc multiple times Foley catheter was placed last night.  Objective: Vitals:   05/19/18 0504 05/19/18 1234 05/19/18 2211 05/20/18 0544  BP: (!) 144/91 138/81 137/83 (!) 143/72  Pulse: 63 69 73 71  Resp: 16 18 18 16   Temp: 97.6 F (36.4 C) 98.4 F (36.9 C) 98.4 F (36.9 C) 97.9 F (36.6 C)  TempSrc: Oral Oral Oral Oral  SpO2: 96% 94% 98%  97%  Weight:      Height:        Intake/Output Summary (Last 24 hours) at 05/20/2018 0900 Last data filed at 05/20/2018 0200 Gross per 24 hour  Intake 658.09 ml  Output 1400 ml  Net -741.91 ml   Filed Weights   05/18/18 1828  Weight: 85.5 kg    Examination:  General exam: Appears calm and comfortable  Respiratory system: Clear to auscultation. Respiratory effort normal. Cardiovascular system: S1 & S2 heard, RRR. No JVD, murmurs, rubs, gallops or clicks. No pedal edema. Gastrointestinal system: Abdomen is nondistended, soft and nontender. No organomegaly or masses felt. Normal bowel sounds heard. Central nervous system: Awake confused Extremities: Symmetric 5 x 5 power. Skin: No rashes, lesions or ulcers  Data Reviewed: I have personally reviewed following labs and imaging studies  CBC: Recent Labs  Lab 05/18/18 1900 05/19/18 0723  WBC 6.7 5.1  NEUTROABS 4.5  --   HGB 12.2 10.8*  HCT 39.8 34.7*  MCV 85.2 83.6  PLT 324 628   Basic Metabolic Panel: Recent Labs  Lab 05/18/18 1900 05/19/18 0723 05/20/18 0519  NA 140 140 140  K 3.2* 3.1* 3.9  CL 107 109 108  CO2 24 23 23   GLUCOSE 205* 171* 156*  BUN 8 7* 8  CREATININE 0.64 0.54 0.57  CALCIUM 9.1 8.6* 9.2  MG  --   --  1.9   GFR: Estimated Creatinine Clearance: 64.1 mL/min (by C-G formula based on SCr of 0.57 mg/dL). Liver Function Tests: Recent Labs  Lab 05/18/18 1900 05/19/18 0723  AST 15 14*  ALT 16 14  ALKPHOS 71 58  BILITOT 0.4 0.8  PROT 7.0 6.1*  ALBUMIN 3.1* 2.8*   No results for input(s): LIPASE, AMYLASE in the last 168 hours. No results for input(s): AMMONIA in the last 168 hours. Coagulation Profile: No results for input(s): INR, PROTIME in the last 168 hours. Cardiac Enzymes: No results for input(s): CKTOTAL, CKMB, CKMBINDEX, TROPONINI in the last 168 hours. BNP (last 3 results) No results for input(s): PROBNP in the last 8760 hours. HbA1C: No results for input(s): HGBA1C in the last  72 hours. CBG: Recent Labs  Lab 05/19/18 0801 05/19/18 1228 05/19/18 1706 05/19/18 2207 05/20/18 0828  GLUCAP 159* 195* 167* 147* 155*   Lipid Profile: No results for input(s): CHOL, HDL, LDLCALC, TRIG, CHOLHDL, LDLDIRECT in the last 72 hours. Thyroid Function Tests: No results for input(s): TSH, T4TOTAL, FREET4, T3FREE, THYROIDAB in the last 72 hours. Anemia Panel: No results for input(s): VITAMINB12, FOLATE, FERRITIN, TIBC, IRON, RETICCTPCT in the last 72 hours. Sepsis Labs: No results for input(s): PROCALCITON, LATICACIDVEN in the last 168 hours.  Recent Results (from the past 240 hour(s))  Culture, Urine     Status: Abnormal   Collection Time: 05/10/18 12:15 PM  Result Value Ref Range Status   Specimen Description   Final    URINE, RANDOM Performed at [facility] [address] [place], [place] [address]    Special Requests   Final    NONE Performed at [facility], [place] [address] [place], [place] [address]    Culture 40,000 COLONIES/mL YEAST (A)  Final   Report Status 05/11/2018 FINAL  Final  Culture, blood (  Routine x 2)     Status: None   Collection Time: 05/10/18  8:05 PM  Result Value Ref Range Status   Specimen Description   Final    BLOOD LEFT HAND Performed at [facility] [address], [place], [place] [address]    Special Requests   Final    BOTTLES DRAWN AEROBIC AND ANAEROBIC Blood Culture adequate volume Performed at [facility] [address] [place], [place] [address]    Culture   Final    NO GROWTH 5 DAYS Performed at [facility], [place] [address] [place], [place] [address]    Report Status 05/15/2018 FINAL  Final  MRSA PCR Screening     Status: Abnormal   Collection Time: 05/11/18 12:26 AM  Result Value Ref Range Status   MRSA by PCR POSITIVE (A) NEGATIVE Final    Comment:        The GeneXpert MRSA Assay (FDA approved for NASAL specimens only), is one  component of a comprehensive MRSA colonization surveillance program. It is not intended to diagnose MRSA infection nor to guide or monitor treatment for MRSA infections. CRITICAL RESULT CALLED TO, READ BACK BY AND VERIFIED WITH: RN ISABELLA AT 6734 05/11/18 [place] A Performed at [facility], [place] [address], [place], [place] [address]          Radiology Studies: Dg Chest 2 View  Result Date: 05/18/2018 CLINICAL DATA:  Altered mental status. Generalized weakness and lethargy for 3 days. EXAM: CHEST - 2 VIEW COMPARISON:  Radiograph 05/10/2018 FINDINGS: Low lung volumes. Unchanged heart size and mediastinal contours. Vague retrocardiac and right suprahilar opacity. Calcified granuloma in the right mid lung. No large pleural effusion. No pneumothorax. No acute osseous abnormalities. IMPRESSION: Vague retrocardiac and right suprahilar opacities, nonspecific. This may represent atelectasis, or pneumonia, including atypical viral infection. Opacities are new from exam 1 week ago. Electronically Signed   By: Keith Rake M.D.   On: 05/18/2018 19:55   Ct Head Wo Contrast  Result Date: 05/18/2018 CLINICAL DATA:  Encephalopathy. EXAM: CT HEAD WITHOUT CONTRAST TECHNIQUE: Contiguous axial images were obtained from the base of the skull through the vertex without intravenous contrast. COMPARISON:  08/10/2016 FINDINGS: Brain: No intracranial hemorrhage, mass effect, or midline shift. Stable moderate to advanced atrophy. Stable moderate to advanced chronic small vessel ischemia. No hydrocephalus. The basilar cisterns are patent. No evidence of territorial infarct or acute ischemia. No extra-axial or intracranial fluid collection. Vascular: Atherosclerosis of skullbase vasculature without hyperdense vessel or abnormal calcification. Skull: No fracture or suspicious lesion. Small frontal bony excrescence, stable from many years and considered benign. Sinuses/Orbits: Paranasal sinuses  and mastoid air cells are clear. The visualized orbits are unremarkable. Bilateral cataract resection. Other: None. IMPRESSION: 1. No acute intracranial abnormality. 2. Unchanged atrophy and chronic small vessel ischemia. Electronically Signed   By: Keith Rake M.D.   On: 05/18/2018 21:19        Scheduled Meds:  ALPRAZolam  0.25 mg Oral QHS   amLODipine  5 mg Oral Daily   aspirin  325 mg Oral Daily   divalproex  125 mg Oral QPM   enoxaparin (LOVENOX) injection  40 mg Subcutaneous Q24H   fluconazole  200 mg Oral Daily   insulin aspart  0-5 Units Subcutaneous QHS   insulin aspart  0-9 Units Subcutaneous TID WC   linagliptin  5 mg Oral Daily   losartan  100 mg Oral Daily   magnesium oxide  400 mg Oral Daily   metoprolol succinate  50 mg Oral Daily   mirtazapine  15 mg Oral QHS   pantoprazole  40 mg Oral Daily   pramipexole  1.5 mg Oral TID   Continuous Infusions:  ceFEPime (MAXIPIME) IV     ceFEPime (MAXIPIME) IV Stopped (05/19/18 2313)   vancomycin 250 mL/hr at 05/20/18 0200     LOS: 2 days     Georgette Shell, MD [facility]  If 7PM-7AM, please contact night-coverage [email or web address] Password [email or web address] 05/20/2018, 9:00 AM

## 2018-05-20 NOTE — Progress Notes (Signed)
Pt had 2 minutes of SVT up to 170s, then returned to NSR 70s. Pt asymptomatic, sleeping at the time. VSS. K 3.9, Mag 1.9. MD Rodena Piety paged to be made aware.

## 2018-05-21 LAB — GLUCOSE, CAPILLARY
Glucose-Capillary: 173 mg/dL — ABNORMAL HIGH (ref 70–99)
Glucose-Capillary: 161 mg/dL — ABNORMAL HIGH (ref 70–99)
Glucose-Capillary: 171 mg/dL — ABNORMAL HIGH (ref 70–99)
Glucose-Capillary: 153 mg/dL — ABNORMAL HIGH (ref 70–99)

## 2018-05-21 LAB — PROCALCITONIN: Procalcitonin: <0.1 ng/mL

## 2018-05-21 MED ORDER — AMOXICILLIN-POT CLAVULANATE 875-125 MG PO TABS
1.0000 | ORAL_TABLET | Freq: Two times a day (BID) | ORAL | Status: DC
  Administered 2018-05-21 – 2018-05-24 (×6): 1 via ORAL
  Filled 2018-05-21 (×6): qty 1

## 2018-05-21 MED ORDER — DOXYCYCLINE HYCLATE 100 MG PO TABS
100.0000 mg | ORAL_TABLET | Freq: Two times a day (BID) | ORAL | Status: DC
  Administered 2018-05-21 – 2018-05-24 (×6): 100 mg via ORAL
  Filled 2018-05-21 (×6): qty 1

## 2018-05-21 NOTE — Progress Notes (Signed)
This RN attempted to call and update family per request. There was no answer and the voice mailbox was full. Will try back as time allows.

## 2018-05-21 NOTE — Progress Notes (Signed)
PROGRESS NOTE    [name]  PJK:[id number] DOB: 03/11/1940 DOA: 05/18/2018 PCP: [name], MD   Brief Narrative:77 y.o.femalewith medical history significant ofrecent admission from March 19-23 with sepsis hypotension due to UTI with urine culture showing yeast, history of dementia, hypertension, stage IV sacral decubitus ulcer, diabetes, periprosthetic right knee fracture with bedbound status who was brought back today due to urinary retention weakness as well as altered mental status for 2 days. Patient has had decreased p.o. intake. She is not able to communicate adequately..  ED Course:Temperature is99 2 blood pressure 170/108 pulse 83 respiratory rate of 26 oxygen sats 93% on room air. White count is 6.7 hemoglobin 12.2 and platelet count 324. Sodium is 140 potassium 3.2 the rest of the electrolytes are within normal. Glucose is 205. Urinalysis showed WBC and leukocyte positive but no visible bacteria. Head CT without contrast showed no active disease. Chest x-ray shows Retrocardiac and right suprahilar opacities questionable pneumonia.Patient has been worked up including sample of COVID-19 taken. She is being admitted for treatment  Assessment & Plan:   Principal Problem:   Community acquired pneumonia of left upper lobe of lung ([place]) Active Problems:   HYPERCHOLESTEROLEMIA   ANXIETY DEPRESSION   Essential hypertension   Spinal stenosis, lumbar region, with neurogenic claudication   UTI (urinary tract infection)   Dementia ([place])   DM type 2 (diabetes mellitus, type 2) ([place])   Altered mental status  #1. Healthcare associated pneumonia:Patient just left the hospital and is back with findings suggestive of pneumonia.  Patient was initially treated with vancomycin and cefepime.  MRSA PCR is positive.  Will DC vancomycin and cefepime and start her on doxycycline and Augmentin.  COVID-19 is still pending.  Patient remains on room air saturation 93%.   #2  UTIwith urinary retention40,000 colonies of yeastnoted. Patient on Diflucan at the time of admission. Unclear how long she has been on it. However with a new retention and urine culture will give her Diflucan 200 mg daily for 3 days. Bladder scan more than 400 cc Foley placed 05/19/2018.  #3 dementia:Advanced dementia. Continue supportive care  #4 diabetes:Continue with sliding scale insulin and hold metformin.  #5 hypertension:Continue blood pressure control.  #6 anxiety with depression:Continue home regimen and monitor closely.  #7 stage IV sacral decubitus ulcer:Continue wound care. 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  8 hypokalemia replete.  DVT prophylaxis:Lovenox Code Status:DNR Family Communication:DW DAUGHTER MASHALLE Disposition Plan:Pending covered 19 .Marland Kitchen Patient comes from skilled nursing facility will discharge back to SNF.   Consults called:None  Pressure Injury 05/10/18 Stage II -  Partial thickness loss of dermis presenting as a shallow open ulcer with a red, pink wound bed without slough. (Active)  05/10/18 1900  Location: Sacrum  Location Orientation: Right;Left  Staging: Stage II -  Partial thickness loss of dermis presenting as a shallow open ulcer with a red, pink wound bed without slough.  Wound Description (Comments):   Present on Admission: Yes    Estimated body mass index is 29.52 kg/m as calculated from the following:   Height as of this encounter: 5\' 7"  (1.702 m).   Weight as of this encounter: 85.5 kg.   Subjective: She is resting in bed no acute distress awake denies any chest pain shortness of breath or nausea vomiting.  Objective: Vitals:   05/20/18 1410 05/20/18 2100 05/21/18 0537 05/21/18 0934  BP: (!) 148/86 136/76 135/82 (!) 148/83  Pulse: 76 70 64 67  Resp: 18 14  15   Temp: 98.6 F (37 C) 98.6 F (37 C) (!) 97.4 F (36.3 C)   TempSrc: Oral Oral Oral   SpO2: 96% 94% 92%   Weight:      Height:         Intake/Output Summary (Last 24 hours) at 05/21/2018 1220 Last data filed at 05/21/2018 1109 Gross per 24 hour  Intake 840 ml  Output 1600 ml  Net -760 ml   Filed Weights   05/18/18 1828  Weight: 85.5 kg    Examination:  General exam: Appears calm and comfortable  Respiratory system: Decreased breath sounds at the bases to auscultation. Respiratory effort normal. Cardiovascular system: S1 & S2 heard, RRR. No JVD, murmurs, rubs, gallops or clicks. No pedal edema. Gastrointestinal system: Abdomen is nondistended, soft and nontender. No organomegaly or masses felt. Normal bowel sounds heard. Central nervous system: Alert and oriented. No focal neurological deficits. Extremities: Symmetric 5 x 5 power. Skin: No rashes, lesions or ulcers Psychiatry: Judgement and insight appear normal. Mood & affect appropriate.     Data Reviewed: I have personally reviewed following labs and imaging studies  CBC: Recent Labs  Lab 05/18/18 1900 05/19/18 0723  WBC 6.7 5.1  NEUTROABS 4.5  --   HGB 12.2 10.8*  HCT 39.8 34.7*  MCV 85.2 83.6  PLT 324 834   Basic Metabolic Panel: Recent Labs  Lab 05/18/18 1900 05/19/18 0723 05/20/18 0519  NA 140 140 140  K 3.2* 3.1* 3.9  CL 107 109 108  CO2 24 23 23   GLUCOSE 205* 171* 156*  BUN 8 7* 8  CREATININE 0.64 0.54 0.57  CALCIUM 9.1 8.6* 9.2  MG  --   --  1.9   GFR: Estimated Creatinine Clearance: 64.1 mL/min (by C-G formula based on SCr of 0.57 mg/dL). Liver Function Tests: Recent Labs  Lab 05/18/18 1900 05/19/18 0723  AST 15 14*  ALT 16 14  ALKPHOS 71 58  BILITOT 0.4 0.8  PROT 7.0 6.1*  ALBUMIN 3.1* 2.8*   No results for input(s): LIPASE, AMYLASE in the last 168 hours. No results for input(s): AMMONIA in the last 168 hours. Coagulation Profile: No results for input(s): INR, PROTIME in the last 168 hours. Cardiac Enzymes: No results for input(s): CKTOTAL, CKMB, CKMBINDEX, TROPONINI in the last 168 hours. BNP (last 3  results) No results for input(s): PROBNP in the last 8760 hours. HbA1C: No results for input(s): HGBA1C in the last 72 hours. CBG: Recent Labs  Lab 05/20/18 1139 05/20/18 1701 05/20/18 2056 05/21/18 0754 05/21/18 1214  GLUCAP 202* 153* 158* 173* 161*   Lipid Profile: No results for input(s): CHOL, HDL, LDLCALC, TRIG, CHOLHDL, LDLDIRECT in the last 72 hours. Thyroid Function Tests: No results for input(s): TSH, T4TOTAL, FREET4, T3FREE, THYROIDAB in the last 72 hours. Anemia Panel: No results for input(s): VITAMINB12, FOLATE, FERRITIN, TIBC, IRON, RETICCTPCT in the last 72 hours. Sepsis Labs: Recent Labs  Lab 05/21/18 0530  PROCALCITON <0.10    No results found for this or any previous visit (from the past 240 hour(s)).       Radiology Studies: No results found.      Scheduled Meds: . ALPRAZolam  0.25 mg Oral QHS  . amLODipine  5 mg Oral Daily  . aspirin  325 mg Oral Daily  . divalproex  125 mg Oral QPM  . enoxaparin (LOVENOX) injection  40 mg Subcutaneous Q24H  . insulin aspart  0-5 Units Subcutaneous QHS  . insulin aspart  0-9  Units Subcutaneous TID WC  . linagliptin  5 mg Oral Daily  . losartan  100 mg Oral Daily  . magnesium oxide  800 mg Oral Daily  . metoprolol succinate  50 mg Oral Daily  . mirtazapine  15 mg Oral QHS  . pantoprazole  40 mg Oral Daily  . pramipexole  1.5 mg Oral TID   Continuous Infusions: . ceFEPime (MAXIPIME) IV 2 g (05/21/18 0926)  . vancomycin Stopped (05/21/18 0925)     LOS: 3 days     Georgette Shell, MD [facility]  If 7PM-7AM, please contact night-coverage [email or web address] Password [email or web address] 05/21/2018, 12:20 PM

## 2018-05-21 NOTE — Plan of Care (Signed)
  Problem: Education: Goal: Knowledge of General Education information will improve Description Including pain rating scale, medication(s)/side effects and non-pharmacologic comfort measures Outcome: Progressing   Problem: Health Behavior/Discharge Planning: Goal: Ability to manage health-related needs will improve Outcome: Progressing   Problem: Clinical Measurements: Goal: Ability to maintain clinical measurements within normal limits will improve Outcome: Progressing Goal: Will remain free from infection Outcome: Progressing Goal: Diagnostic test results will improve Outcome: Progressing Goal: Respiratory complications will improve Outcome: Progressing Goal: Cardiovascular complication will be avoided Outcome: Progressing   Problem: Activity: Goal: Risk for activity intolerance will decrease Outcome: Progressing   Problem: Nutrition: Goal: Adequate nutrition will be maintained 05/21/2018 1350 by Dionisio Paschal, RN Outcome: Progressing 05/21/2018 1346 by Dionisio Paschal, RN Outcome: Progressing Note:  Patient is being ordered for and fed at each meal. Family shared what patient eats at home to promote appetite at hospital.   The patient likes grits, sausage and pancakes for breakfast. She likes salads with chicken and ranch for lunch and dinner. Daughter said that the patient usually eats salads for lunch and dinner.    Problem: Coping: Goal: Level of anxiety will decrease Outcome: Progressing   Problem: Elimination: Goal: Will not experience complications related to bowel motility Outcome: Progressing Goal: Will not experience complications related to urinary retention 05/21/2018 1350 by Dionisio Paschal, RN Outcome: Progressing 05/21/2018 1346 by Dionisio Paschal, RN Outcome: Progressing Note:  Patient has a urinary catheter in place.   Problem: Pain Managment: Goal: General experience of comfort will improve Outcome: Progressing   Problem: Safety: Goal: Ability to remain  free from injury will improve 05/21/2018 1350 by Dionisio Paschal, RN Outcome: Progressing 05/21/2018 1346 by Dionisio Paschal, RN Outcome: Progressing Note:  Bed alarm is on, proper equipment is being used to move patient    Problem: Skin Integrity: Goal: Risk for impaired skin integrity will decrease 05/21/2018 1350 by Dionisio Paschal, RN Outcome: Progressing 05/21/2018 1346 by Dionisio Paschal, RN Outcome: Progressing Note:  Patient has a stage 4 pressure ulcer on her coccyx. This RN cleaned the site and changed the dressing today.

## 2018-05-22 LAB — NOVEL CORONAVIRUS, NAA (HOSPITAL ORDER, SEND-OUT TO REF LAB): SARS-COV-2, NAA: NOT DETECTED

## 2018-05-22 LAB — CREATININE, SERUM
Creatinine, Ser: 0.64 mg/dL (ref 0.44–1.00)
GFR calc Af Amer: >60 mL/min (ref >60)
GFR calc non Af Amer: >60 mL/min (ref >60)

## 2018-05-22 LAB — PROCALCITONIN: Procalcitonin: <0.1 ng/mL

## 2018-05-22 LAB — NOVEL CORONAVIRUS, NAA (HOSP ORDER, SEND-OUT TO REF LAB; TAT 18-24 HRS)

## 2018-05-22 LAB — GLUCOSE, CAPILLARY
Glucose-Capillary: 159 mg/dL — ABNORMAL HIGH (ref 70–99)
Glucose-Capillary: 178 mg/dL — ABNORMAL HIGH (ref 70–99)
Glucose-Capillary: 254 mg/dL — ABNORMAL HIGH (ref 70–99)
Glucose-Capillary: 269 mg/dL — ABNORMAL HIGH (ref 70–99)

## 2018-05-22 MED ORDER — CHLORHEXIDINE GLUCONATE CLOTH 2 % EX PADS
6.0000 | MEDICATED_PAD | Freq: Every day | CUTANEOUS | Status: DC
  Administered 2018-05-23: 6 via TOPICAL

## 2018-05-22 MED ORDER — MUPIROCIN 2 % EX OINT
1.0000 "application " | TOPICAL_OINTMENT | Freq: Two times a day (BID) | CUTANEOUS | Status: DC
  Administered 2018-05-22 – 2018-05-24 (×4): 1 via NASAL
  Filled 2018-05-22: qty 22

## 2018-05-22 NOTE — Progress Notes (Signed)
I have spoken with pt's PCP, nursing staff, reviewed patient's chart. They have a clear alternative diagnosis and COVID precautions are being d/c.  She is afebrile, her COVID test is (-)

## 2018-05-22 NOTE — Care Management Important Message (Signed)
Important Message  Patient Details  Name: LANDRI DORSAINVIL MRN: [id number] Date of Birth: Sep 09, 1940   Medicare Important Message Given:  Yes    Kerin Salen 05/22/2018, 11:31 AMImportant Message  Patient Details  Name: DONIS KOTOWSKI MRN: [id number] Date of Birth: [birth year]   Medicare Important Message Given:  Yes    Kerin Salen 05/22/2018, 11:31 AM

## 2018-05-22 NOTE — Consult Note (Signed)
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 Requested MD to place image in the chart for telehealth consultation. Pending.   Noted patient was seen for same area in 2016 by members of the [facility] nursing team.   [facility] [place], [place], [place]

## 2018-05-22 NOTE — Consult Note (Signed)
[place] Nurse wound consult note performed using telehealth  Reason for Consult: sacral wound, after review of records and new images in chart. Chronic in nature, with a large area of the previous wound/wounds re-epithelialized  Wound type: Stage 3 Pressure injury; full thickness  Pressure Injury POA: Yes Measurement: see nursing flow sheet but can note less than 0.2cm of depth in images placed this am Wound bed:90%% pale pink, non granular/10% dark ruddy along distal aspect of wound  Drainage (amount, consistency, odor) minimal per nursing assessment and notes Periwound: intact, evidence of larger wound at some point, epibole of the wound edges  Dressing procedure/placement/frequency: Add hydrogel daily, will maintain moisture for 24 hours. Cover with dry dressing. Change daily.  Air mattress for moisture management and pressure redistribution. Will ask this be added once COVID-19 has been ruled out.  Discussed POC with bedside nurse.  Re consult if needed, will not follow at this time. Thanks  Nazeer Romney [employer], RN,CWOCN, CNS, [place] [phone])

## 2018-05-22 NOTE — Progress Notes (Signed)
PROGRESS NOTE    [name]  KKX:[id number] DOB: [birth year] DOA: 05/18/2018 PCP: [name], MD   Brief Narrative:77 y.o.femalewith medical history significant ofrecent admission from March 19-23 with sepsis hypotension due to UTI with urine culture showing yeast, history of dementia, hypertension, stage IV sacral decubitus ulcer, diabetes, periprosthetic right knee fracture with bedbound status who was brought back today due to urinary retention weakness as well as altered mental status for 2 days. Patient has had decreased p.o. intake. She is not able to communicate adequately..  ED Course:Temperature is99 2 blood pressure 170/108 pulse 83 respiratory rate of 26 oxygen sats 93% on room air. White count is 6.7 hemoglobin 12.2 and platelet count 324. Sodium is 140 potassium 3.2 the rest of the electrolytes are within normal. Glucose is 205. Urinalysis showed WBC and leukocyte positive but no visible bacteria. Head CT without contrast showed no active disease. Chest x-ray shows Retrocardiac and right suprahilar opacities questionable pneumonia.Patient has been worked up including sample of COVID-19 taken. She is being admitted for treatment  Assessment & Plan:   Principal Problem:   Community acquired pneumonia of left upper lobe of lung ([place]) Active Problems:   HYPERCHOLESTEROLEMIA   ANXIETY DEPRESSION   Essential hypertension   Spinal stenosis, lumbar region, with neurogenic claudication   UTI (urinary tract infection)   Dementia ([place])   DM type 2 (diabetes mellitus, type 2) ([place])   Altered mental status  #1. Healthcare associated pneumonia:Patient just left the hospital and is back with findings suggestive of pneumonia.  Patient was initially treated with vancomycin and cefepime.  MRSA PCR is positive.  Will DC vancomycin and cefepime and start her on doxycycline and Augmentin 05/21/2018. COVID-19 is still pending.  Patient remains on room air saturation 93%.     #2 UTIwith urinary retention40,000 colonies of yeastnoted.  Treated with Diflucan.. Bladder scanmore than 400 cc Foley placed 05/19/2018.  Continue Foley care for acute urinary retention and now patient has 3 stage III sacral decub.  #3 dementia:Advanced dementia. Continue Depakote supportive care  #4 diabetes:Continue with sliding scale insulin and hold metformin.  #5 hypertension:Continue Norvasc and metoprolol and losartan  #6 anxiety with depression:Continue home regimen and monitor closely.  #7 stage 3 sacral decubitus ulcer:Appreciate wound care input.  Recommends adding hydrogel daily cover with dry dressing and change daily.  Air mattress for moisture management and pressure redistribution.  This will be added once a COVID-19 has been ruled out.    8 hypokalemia replete.  DVT prophylaxis:Lovenox Code Status:DNR Family Communication:DW DAUGHTER MASHALLE Disposition Plan:Pending covered 19 .Marland Kitchen Patient comes from skilled nursing facility will discharge back to SNF.   Consults called:None  Pressure Injury 05/10/18 Stage II -  Partial thickness loss of dermis presenting as a shallow open ulcer with a red, pink wound bed without slough. (Active)  05/10/18 1900  Location: Sacrum  Location Orientation: Right;Left  Staging: Stage II -  Partial thickness loss of dermis presenting as a shallow open ulcer with a red, pink wound bed without slough.  Wound Description (Comments):   Present on Admission: Yes      Estimated body mass index is 29.52 kg/m as calculated from the following:   Height as of this encounter: 5\' 7"  (1.702 m).   Weight as of this encounter: 85.5 kg.    Subjective:  Patient resting in bed in no acute distress no overnight events. Objective: Vitals:   05/21/18 1220 05/21/18 2209 05/21/18 2243 05/22/18 0904  BP: (!) 156/73  139/78  126/76  Pulse: 60 74  73  Resp: (!) 23 18  20   Temp: 98.5 F (36.9 C) 98.6 F (37 C)  98.8 F (37.1  C)  TempSrc: Oral Oral  Oral  SpO2: 98% (!) 86% 94% 92%  Weight:      Height:        Intake/Output Summary (Last 24 hours) at 05/22/2018 1024 Last data filed at 05/22/2018 0306 Gross per 24 hour  Intake 428 ml  Output 1400 ml  Net -972 ml   Filed Weights   05/18/18 1828  Weight: 85.5 kg    Examination:  General exam: Appears calm and comfortable  Respiratory system: Clear to auscultation. Respiratory effort normal. Cardiovascular system: S1 & S2 heard, RRR. No JVD, murmurs, rubs, gallops or clicks. No pedal edema. Gastrointestinal system: Abdomen is nondistended, soft and nontender. No organomegaly or masses felt. Normal bowel sounds heard. Central nervous system: Alert and oriented. No focal neurological deficits. Extremities: Symmetric 5 x 5 power. Skin: No rashes, lesions or ulcers Psychiatry: Judgement and insight appear normal. Mood & affect appropriate.     Data Reviewed: I have personally reviewed following labs and imaging studies  CBC: Recent Labs  Lab 05/18/18 1900 05/19/18 0723  WBC 6.7 5.1  NEUTROABS 4.5  --   HGB 12.2 10.8*  HCT 39.8 34.7*  MCV 85.2 83.6  PLT 324 740   Basic Metabolic Panel: Recent Labs  Lab 05/18/18 1900 05/19/18 0723 05/20/18 0519 05/22/18 0346  NA 140 140 140  --   K 3.2* 3.1* 3.9  --   CL 107 109 108  --   CO2 24 23 23   --   GLUCOSE 205* 171* 156*  --   BUN 8 7* 8  --   CREATININE 0.64 0.54 0.57 0.64  CALCIUM 9.1 8.6* 9.2  --   MG  --   --  1.9  --    GFR: Estimated Creatinine Clearance: 64.1 mL/min (by C-G formula based on SCr of 0.64 mg/dL). Liver Function Tests: Recent Labs  Lab 05/18/18 1900 05/19/18 0723  AST 15 14*  ALT 16 14  ALKPHOS 71 58  BILITOT 0.4 0.8  PROT 7.0 6.1*  ALBUMIN 3.1* 2.8*   No results for input(s): LIPASE, AMYLASE in the last 168 hours. No results for input(s): AMMONIA in the last 168 hours. Coagulation Profile: No results for input(s): INR, PROTIME in the last 168 hours. Cardiac  Enzymes: No results for input(s): CKTOTAL, CKMB, CKMBINDEX, TROPONINI in the last 168 hours. BNP (last 3 results) No results for input(s): PROBNP in the last 8760 hours. HbA1C: No results for input(s): HGBA1C in the last 72 hours. CBG: Recent Labs  Lab 05/21/18 0754 05/21/18 1214 05/21/18 1634 05/21/18 2201 05/22/18 0729  GLUCAP 173* 161* 171* 153* 159*   Lipid Profile: No results for input(s): CHOL, HDL, LDLCALC, TRIG, CHOLHDL, LDLDIRECT in the last 72 hours. Thyroid Function Tests: No results for input(s): TSH, T4TOTAL, FREET4, T3FREE, THYROIDAB in the last 72 hours. Anemia Panel: No results for input(s): VITAMINB12, FOLATE, FERRITIN, TIBC, IRON, RETICCTPCT in the last 72 hours. Sepsis Labs: Recent Labs  Lab 05/21/18 0530 05/22/18 0346  PROCALCITON <0.10 <0.10    No results found for this or any previous visit (from the past 240 hour(s)).       Radiology Studies: No results found.      Scheduled Meds:  ALPRAZolam  0.25 mg Oral QHS   amLODipine  5 mg Oral Daily  amoxicillin-clavulanate  1 tablet Oral Q12H   aspirin  325 mg Oral Daily   divalproex  125 mg Oral QPM   doxycycline  100 mg Oral Q12H   enoxaparin (LOVENOX) injection  40 mg Subcutaneous Q24H   insulin aspart  0-5 Units Subcutaneous QHS   insulin aspart  0-9 Units Subcutaneous TID WC   linagliptin  5 mg Oral Daily   losartan  100 mg Oral Daily   magnesium oxide  800 mg Oral Daily   metoprolol succinate  50 mg Oral Daily   mirtazapine  15 mg Oral QHS   pantoprazole  40 mg Oral Daily   pramipexole  1.5 mg Oral TID   Continuous Infusions:   LOS: 4 days    Georgette Shell, MD [facility]  If 7PM-7AM, please contact night-coverage [email or web address] Password [facility] 05/22/2018, 10:24 AM

## 2018-05-23 DIAGNOSIS — E1165 Type 2 diabetes mellitus with hyperglycemia: Secondary | ICD-10-CM

## 2018-05-23 LAB — GLUCOSE, CAPILLARY
Glucose-Capillary: 174 mg/dL — ABNORMAL HIGH (ref 70–99)
Glucose-Capillary: 205 mg/dL — ABNORMAL HIGH (ref 70–99)
Glucose-Capillary: 213 mg/dL — ABNORMAL HIGH (ref 70–99)
Glucose-Capillary: 272 mg/dL — ABNORMAL HIGH (ref 70–99)

## 2018-05-23 LAB — COMPREHENSIVE METABOLIC PANEL
ALT: 10 U/L (ref 0–44)
AST: 13 U/L — ABNORMAL LOW (ref 15–41)
Albumin: 3.1 g/dL — ABNORMAL LOW (ref 3.5–5.0)
Alkaline Phosphatase: 69 U/L (ref 38–126)
Anion gap: 10 (ref 5–15)
BUN: 14 mg/dL (ref 8–23)
CO2: 23 mmol/L (ref 22–32)
Calcium: 9.4 mg/dL (ref 8.9–10.3)
Chloride: 103 mmol/L (ref 98–111)
Creatinine, Ser: 0.74 mg/dL (ref 0.44–1.00)
GFR calc Af Amer: >60 mL/min (ref >60)
GFR calc non Af Amer: >60 mL/min (ref >60)
Glucose, Bld: 192 mg/dL — ABNORMAL HIGH (ref 70–99)
Potassium: 4.1 mmol/L (ref 3.5–5.1)
Sodium: 136 mmol/L (ref 135–145)
Total Bilirubin: 0.5 mg/dL (ref 0.3–1.2)
Total Protein: 6.9 g/dL (ref 6.5–8.1)

## 2018-05-23 LAB — AMMONIA: Ammonia: 15 umol/L (ref 9–35)

## 2018-05-23 MED ORDER — ASPIRIN 325 MG PO TABS
325.0000 mg | ORAL_TABLET | Freq: Every day | ORAL | Status: DC
  Administered 2018-05-23 – 2018-05-24 (×2): 325 mg via ORAL
  Filled 2018-05-23 (×2): qty 1

## 2018-05-23 MED ORDER — METOPROLOL TARTRATE 5 MG/5ML IV SOLN
5.0000 mg | Freq: Once | INTRAVENOUS | Status: AC
  Administered 2018-05-23: 5 mg via INTRAVENOUS

## 2018-05-23 MED ORDER — METOPROLOL TARTRATE 25 MG PO TABS
25.0000 mg | ORAL_TABLET | Freq: Two times a day (BID) | ORAL | Status: DC
  Administered 2018-05-23 – 2018-05-24 (×3): 25 mg via ORAL
  Filled 2018-05-23 (×3): qty 1

## 2018-05-23 NOTE — Progress Notes (Signed)
Inpatient Diabetes Program Recommendations  AACE/ADA: New Consensus Statement on Inpatient Glycemic Control (2015)  Target Ranges:  Prepandial:   less than 140 mg/dL      Peak postprandial:   less than 180 mg/dL (1-2 hours)      Critically ill patients:  140 - 180 mg/dL   Results for CAPUCINE, TRYON (MRN [id number]) as of 05/23/2018 10:16  Ref. Range 05/22/2018 07:29 05/22/2018 11:43 05/22/2018 16:48 05/22/2018 21:35  Glucose-Capillary Latest Ref Range: 70 - 99 mg/dL 159 (H)  2 units NOVOLOG  178 (H) 254 (H)  5 units NOVOLOG  269 (H)  3 units NOVOLOG      Admit with: Pneumonia/ UTI  History: DM/ Dementia  Home DM Meds: Glyxambi 10/5 mg--1 tablet daily       Metformin 500 mg BID       Levemir 10 units BID (NOT taking)  Current Orders: Novolog Sensitive Correction Scale/ SSI (0-9 units) TID AC + HS      Tradjenta 5 mg daily        MD- Please consider adding low dose Novolog Meal Coverage to current inpatient insulin regimen:  Novolog 3 units TID with meals  (Please add the following Hold Parameters: Hold if pt eats <50% of meal, Hold if pt NPO)     --Will follow patient during hospitalization--  Wyn Quaker RN, MSN, CDE Diabetes Coordinator Inpatient Glycemic Control Team Team Pager: [phone] (8a-5p)

## 2018-05-23 NOTE — Progress Notes (Signed)
PROGRESS NOTE    [name]  IOX:[id number] DOB: 08/13/1940 DOA: 05/18/2018 PCP: [name], MD   Brief Narrative: [name] is a 78 y.o. female with a history of diabetes, periprosthetic knee fracture, essential hypertension, anxiety, dementia, depression. She presented secondary to altered mental status. She has been found to have a pneumonia. Testing for COVID-19 negative.   Assessment & Plan:   Principal Problem:   Community acquired pneumonia of left upper lobe of lung ([place]) Active Problems:   HYPERCHOLESTEROLEMIA   ANXIETY DEPRESSION   Essential hypertension   Spinal stenosis, lumbar region, with neurogenic claudication   UTI (urinary tract infection)   Dementia ([place])   DM type 2 (diabetes mellitus, type 2) (H[facility])   Altered mental status  Altered mental status Thought secondary to metabolic encephalopathy from infection. Not at baseline per discussion with patient's daughter today. CT scan previous unremarkable. -Continue to treat infection  Healthcare associated pneumonia Empirically treated with multiple antibiotics. COVID-19 negative. -Continue Augmentin/doxycycline  Dementia Per daughter, patient is able to communicate at baseline, although she has her good/bad days -Continue Depakote  Diabetes mellitus, type II -Continue SSI and Tradjenta   Essential hypertension -Continue amlodipine, losartan. Switch from Toprol XL 50 mg daily to Lopressor 25 mg BID so she is able to take crushed  Anxiety Depression Patient is on Depakote. She also has ativan scheduled at night -Continue Depakote -Hold Ativan today to see if this improves cognition  Pressure injury Left/right sacrum, POA   DVT prophylaxis: Lovenox Code Status:   Code Status: Full Code Family Communication: Daughter on telephone Disposition Plan: Discharge pending improvement to baseline mentation if able   Consultants:   None  Procedures:   None  Antimicrobials:   Vancomycin (3/27>>3/29)  Cefepime (3/28>>3/30)  Augmentin (3/30>>  Azithromycin (3/27)  Fluconazole (3/28>>3/30)  Doxycycline (3/30>>   Subjective: Unable to provide me with a history today secondary to mental status  Objective: Vitals:   05/22/18 2055 05/23/18 0525 05/23/18 0950 05/23/18 1217  BP: 124/73 (!) 94/56 (!) 148/78 (!) 150/77  Pulse: 69 63 76   Resp: 20 18 20    Temp: 98.2 F (36.8 C) 98.3 F (36.8 C) 98 F (36.7 C)   TempSrc:   Oral   SpO2: 99% 96% 97%   Weight:      Height:        Intake/Output Summary (Last 24 hours) at 05/23/2018 1248 Last data filed at 05/23/2018 0950 Gross per 24 hour  Intake 716 ml  Output 1050 ml  Net -334 ml   Filed Weights   05/18/18 1828  Weight: 85.5 kg    Examination:  General exam: Appears calm and comfortable Respiratory system: Clear to auscultation. Respiratory effort normal. Cardiovascular system: S1 & S2 heard, RRR. No murmurs, rubs, gallops or clicks. Gastrointestinal system: Abdomen is nondistended, soft and nontender. No organomegaly or masses felt. Normal bowel sounds heard. Central nervous system: Lethargic but arousable. Smiles when she sees me. Does not follow commands.  Extremities: No edema. No calf tenderness Skin: No cyanosis. No rashes Psychiatry: Judgement and insight appear impaired. Flat affect    Data Reviewed: I have personally reviewed following labs and imaging studies  CBC: Recent Labs  Lab 05/18/18 1900 05/19/18 0723  WBC 6.7 5.1  NEUTROABS 4.5  --   HGB 12.2 10.8*  HCT 39.8 34.7*  MCV 85.2 83.6  PLT 324 268   Basic Metabolic Panel: Recent Labs  Lab 05/18/18 1900 05/19/18 0723 05/20/18 0519 05/22/18 0346  NA 140 140 140  --   K 3.2* 3.1* 3.9  --   CL 107 109 108  --   CO2 24 23 23   --   GLUCOSE 205* 171* 156*  --   BUN 8 7* 8  --   CREATININE 0.64 0.54 0.57 0.64  CALCIUM 9.1 8.6* 9.2  --   MG  --   --  1.9  --    GFR: Estimated Creatinine Clearance: 64.1 mL/min (by  C-G formula based on SCr of 0.64 mg/dL). Liver Function Tests: Recent Labs  Lab 05/18/18 1900 05/19/18 0723  AST 15 14*  ALT 16 14  ALKPHOS 71 58  BILITOT 0.4 0.8  PROT 7.0 6.1*  ALBUMIN 3.1* 2.8*   No results for input(s): LIPASE, AMYLASE in the last 168 hours. No results for input(s): AMMONIA in the last 168 hours. Coagulation Profile: No results for input(s): INR, PROTIME in the last 168 hours. Cardiac Enzymes: No results for input(s): CKTOTAL, CKMB, CKMBINDEX, TROPONINI in the last 168 hours. BNP (last 3 results) No results for input(s): PROBNP in the last 8760 hours. HbA1C: No results for input(s): HGBA1C in the last 72 hours. CBG: Recent Labs  Lab 05/22/18 1143 05/22/18 1648 05/22/18 2135 05/23/18 0821 05/23/18 1213  GLUCAP 178* 254* 269* 174* 205*   Lipid Profile: No results for input(s): CHOL, HDL, LDLCALC, TRIG, CHOLHDL, LDLDIRECT in the last 72 hours. Thyroid Function Tests: No results for input(s): TSH, T4TOTAL, FREET4, T3FREE, THYROIDAB in the last 72 hours. Anemia Panel: No results for input(s): VITAMINB12, FOLATE, FERRITIN, TIBC, IRON, RETICCTPCT in the last 72 hours. Sepsis Labs: Recent Labs  Lab 05/21/18 0530 05/22/18 0346  PROCALCITON <0.10 <0.10    Recent Results (from the past 240 hour(s))  Novel Coronavirus, NAA (hospital order; send-out to ref lab)     Status: None   Collection Time: 05/18/18  9:33 PM  Result Value Ref Range Status   SARS-CoV-2, NAA NOT DETECTED NOT DETECTED Final    Comment: (NOTE) This test was developed and its performance characteristics determined by [employer]. This test has not been FDA cleared or approved. This test has been authorized by FDA under an Emergency Use Authorization (EUA). This test has been validated in accordance with the FDA's Guidance Document (Policy for [place] in Laboratories Certified to Perform High Complexity Testing under CLIA prior to Emergency Use Authorization for  Coronavirus [id number] during the [facility] Emergency) issued on February 29th, 2020. FDA independent review of this validation is pending. This test is only authorized for the duration of time the declaration that circumstances exist justifying the authorization of the emergency use of in vitro diagnostic tests for detection of SARS-CoV- 2 virus and/or diagnosis of COVID-19 infection under section 564(b)(1) of the Act, 21 U.S.C. 591MBW-4(Y)(6), unless the authorization is terminated or revoked sooner. Performed At: [facility] [place], [place] [id number] Rush Farmer MD BL:[id number]    [place]  Final    Comment: Performed at [facility] [address] [place], [place] [address]         Radiology Studies: No results found.      Scheduled Meds: . ALPRAZolam  0.25 mg Oral QHS  . amLODipine  5 mg Oral Daily  . amoxicillin-clavulanate  1 tablet Oral Q12H  . aspirin  325 mg Oral Daily  . Chlorhexidine Gluconate Cloth  6 each Topical Q0600  . divalproex  125 mg Oral QPM  . doxycycline  100 mg Oral Q12H  . enoxaparin (LOVENOX) injection  40 mg Subcutaneous Q24H  . insulin aspart  0-5 Units Subcutaneous QHS  . insulin aspart  0-9 Units Subcutaneous TID WC  . linagliptin  5 mg Oral Daily  . losartan  100 mg Oral Daily  . magnesium oxide  800 mg Oral Daily  . metoprolol tartrate  25 mg Oral BID  . mirtazapine  15 mg Oral QHS  . mupirocin ointment  1 application Nasal BID  . pantoprazole  40 mg Oral Daily  . pramipexole  1.5 mg Oral TID   Continuous Infusions:   LOS: 5 days     Cordelia Poche, MD [facility] 05/23/2018, 12:48 PM  If 7PM-7AM, please contact night-coverage [email or web address]

## 2018-05-23 NOTE — Progress Notes (Signed)
Confirmed with pt's daughter  [name] that pt came from home and plan to discharge back home.

## 2018-05-23 NOTE — Progress Notes (Signed)
Spoke with pt's daughter Wynetta Emery [phone] concerning [place] needs at discharge. She asked for a hoyer lift and air mattress. Order in with [facility].

## 2018-05-23 NOTE — Progress Notes (Signed)
Pt HR sustaining 170s, BP 112/79, resp 16. Oxygen saturations 92% on RA. Rapid response called. Dr Lonny Prude notified. New orders received. Will continue to monitor.

## 2018-05-23 NOTE — Plan of Care (Signed)

## 2018-05-24 ENCOUNTER — Encounter (HOSPITAL_COMMUNITY): Payer: Self-pay | Admitting: Family Medicine

## 2018-05-24 LAB — GLUCOSE, CAPILLARY
Glucose-Capillary: 146 mg/dL — ABNORMAL HIGH (ref 70–99)
Glucose-Capillary: 219 mg/dL — ABNORMAL HIGH (ref 70–99)
Glucose-Capillary: 166 mg/dL — ABNORMAL HIGH (ref 70–99)

## 2018-05-24 MED ORDER — AMOXICILLIN-POT CLAVULANATE 875-125 MG PO TABS: 1.0000 | ORAL_TABLET | Freq: Two times a day (BID) | ORAL | 0 refills | Status: AC

## 2018-05-24 MED ORDER — ALPRAZOLAM 0.25 MG PO TABS: 0.2500 mg | ORAL_TABLET | Freq: Every evening | ORAL | Status: AC | PRN

## 2018-05-24 MED ORDER — DOXYCYCLINE HYCLATE 100 MG PO TABS: 100.0000 mg | ORAL_TABLET | Freq: Two times a day (BID) | ORAL | 0 refills | Status: AC

## 2018-05-24 NOTE — Progress Notes (Signed)
Spoke with daughter, pt ready for discharge. [facility] has arrive to transport pt to home. SRP, RN

## 2018-05-24 NOTE — Progress Notes (Signed)
Spoke with pt's daughter concerning HH, she asked for [facility] for wound care. Spoke to [employer] rep concerning air lost mattress cost is $1,000. However, [employer] rep states the daughter can sign a note taking responsibility for bill if insurance does not pay. Daughter agreed to sign for air mattress when it is delivered. Daughter selected [facility], for [facility] needs. [facility] was called for transportation at [facility].

## 2018-05-24 NOTE — Progress Notes (Addendum)
Foley replaced per MD order for acute Urinary Retention, Pt bladder scanned 339 ml noted in bladders. Pt will discharge with 16 Fr. foley cath and follow up with Urology on outpatient.  SRP, RN

## 2018-05-24 NOTE — Progress Notes (Signed)
Pt discharged to home, Instructions reviewed with daughter, questions addressed via phone. Acknowledged understanding. Pt prepared for home, is stable condition. Foley patient, dressing change to sacrum completed as ordered, w/d  Dressing supplies sent with patient. SRP, RN

## 2018-05-24 NOTE — Discharge Instructions (Signed)
[name],  You were in the hospital because of pneumonia. You have been treated with antibiotics and will go home to complete this treatment. You also had some urinary retention. Please follow-up with the urologist to follow this up. You will go home with home health services.

## 2018-05-24 NOTE — Discharge Summary (Addendum)
Physician Discharge Summary  [name] LKT:[id number] DOB: 10-12-1940 DOA: 05/18/2018  PCP: [name], MD  Admit date: 05/18/2018 Discharge date: 05/24/2018  Admitted From: Home Disposition: Home  Recommendations for Outpatient Follow-up:  1. Follow up with PCP in 1 week 2. Follow up with urology 3. Please obtain BMP/CBC in one week 4. Please follow up on the following pending results: None  Home Health: RN Equipment/Devices: None  Discharge Condition: Stable CODE STATUS: Full code Diet recommendation: heart healthy/carb modified   Brief/Interim Summary:  Admission HPI written by Elwyn Reach, MD   Chief Complaint: Altered mental status  HPI: [name] is a 78 y.o. female with medical history significant of recent admission from March 19-23 with sepsis hypotension due to UTI with urine culture showing yeast, history of dementia, hypertension, stage IV sacral decubitus ulcer, diabetes, periprosthetic right knee fracture with bedbound status who was brought back today due to urinary retention weakness as well as altered mental status for 2 days.  Patient has had decreased p.o. intake.  She is not able to communicate adequately..  ED Course: Temperature is 99 2 blood pressure 170/108 pulse 83 respiratory rate of 26 oxygen sats 93% on room air.  White count is 6.7 hemoglobin 12.2 and platelet count 324.  Sodium is 140 potassium 3.2 the rest of the electrolytes are within normal.  Glucose is 205.  Urinalysis showed WBC and leukocyte positive but no visible bacteria.  Head CT without contrast showed no active disease.  Chest x-ray shows Retrocardiac and right suprahilar opacities questionable pneumonia.  Patient has been worked up including sample of COVID-19 taken.  She is being admitted for treatment   Hospital course:  Altered mental status Thought secondary to metabolic encephalopathy from infection. CT head unremarkable. Likely contributed to be  infection and change in patient's environment. Improved prior to discharge but not completely baseline.  Healthcare associated pneumonia Empirically treated with multiple antibiotics. COVID-19 negative. Augmentin/doxycycline for treatment. Eight day treatment course.  Dementia Per daughter, patient is able to communicate at baseline, although she has her good/bad days. Continue Depakote. Patient improved but still not speaking much. Hopefully will improve as an outpatient.  Diabetes mellitus, type II Continue home regimen. Discontinue insulin for now until follow-up with PCP  Essential hypertension Continue amlodipine, losartan. Continue metoprolol  Acute urinary retention Unknown etiology. Foley placed. Outpatient urology follow-up.  Anxiety Depression Patient is on Depakote. She also has ativan scheduled at night. Continue Depakote and Remeron. Changed Ativan to prn at bedtime.  Pressure injury Left/right sacrum, POA. Wound care on discharge with [facility] nursing.  Discharge Diagnoses:  Principal Problem:   Community acquired pneumonia of left upper lobe of lung ([place]) Active Problems:   HYPERCHOLESTEROLEMIA   ANXIETY DEPRESSION   Essential hypertension   Spinal stenosis, lumbar region, with neurogenic claudication   Dementia ([place])   DM type 2 (diabetes mellitus, type 2) ([place])   Altered mental status    Discharge Instructions  Discharge Instructions    Call MD for:  temperature >100.4   Complete by:  As directed    Diet - low sodium heart healthy   Complete by:  As directed      Allergies as of 05/24/2018      Reactions   Flounder [fish Allergy] Other (See Comments)   Pt breaks out in bumps   Morphine And Related Other (See Comments)   Reaction:  Hallucinations    Oxycodone Other (See Comments)   Reaction:  Hallucinations   Sulfonamide Derivatives Swelling, Other (See Comments)   Reaction:  Unspecified swelling reaction       Medication List    STOP taking  these medications   fluconazole 100 MG tablet Commonly known as:  DIFLUCAN   insulin detemir 100 UNIT/ML injection Commonly known as:  LEVEMIR     TAKE these medications   acetaminophen 500 MG tablet Commonly known as:  TYLENOL Take 500 mg by mouth every 8 (eight) hours as needed for mild pain.   ALPRAZolam 0.25 MG tablet Commonly known as:  XANAX Take 1 tablet (0.25 mg total) by mouth at bedtime as needed for anxiety. What changed:    when to take this  reasons to take this   amLODipine 5 MG tablet Commonly known as:  NORVASC Take 1 tablet (5 mg total) by mouth daily.   amoxicillin-clavulanate 875-125 MG tablet Commonly known as:  AUGMENTIN Take 1 tablet by mouth every 12 (twelve) hours for 3 doses.   aspirin 325 MG EC tablet Take 325 mg by mouth daily.   Dermacloud Crea Apply 1 application topically as needed (each change).   divalproex 125 MG capsule Commonly known as:  DEPAKOTE SPRINKLE Take 125 mg by mouth every evening.   doxycycline 100 MG tablet Commonly known as:  VIBRA-TABS Take 1 tablet (100 mg total) by mouth every 12 (twelve) hours for 3 doses.   Glyxambi 10-5 MG Tabs Generic drug:  Empagliflozin-linaGLIPtin Take 1 tablet by mouth daily.   loratadine-pseudoephedrine 5-120 MG tablet Commonly known as:  CLARITIN-D 12-hour Take 1 tablet by mouth 2 (two) times daily as needed for allergies.   losartan 25 MG tablet Commonly known as:  COZAAR Take 100 mg by mouth daily.   Magnesium Oxide 500 MG Tabs Take 500 mg by mouth daily.   metFORMIN 500 MG tablet Commonly known as:  GLUCOPHAGE Take 500 mg by mouth 2 (two) times daily with a meal.   metoprolol succinate 50 MG 24 hr tablet Commonly known as:  TOPROL-XL Take 1 tablet (50 mg total) by mouth daily.   mirtazapine 15 MG tablet Commonly known as:  REMERON Take 15 mg by mouth at bedtime.   omeprazole 40 MG capsule Commonly known as:  PRILOSEC Take 40 mg by mouth daily.   ondansetron 4 MG  tablet Commonly known as:  ZOFRAN Take 4 mg by mouth every 6 (six) hours as needed for nausea/vomiting.   pramipexole 1.5 MG tablet Commonly known as:  MIRAPEX Take 1.5 mg by mouth 3 (three) times daily.            Durable Medical Equipment  (From admission, onward)         Start     Ordered   05/24/18 1121  For home use only DME Air overlay mattress  Once    Comments:  Gel overlay   05/24/18 1120   05/23/18 1057  For home use only DME Other see comment  Once    Comments:  Harrel Lemon Lift for at home use.   05/23/18 1057         Follow-up Information    [name], MD. Schedule an appointment as soon as possible for a visit in 1 week(s).   Specialty:  Internal Medicine Contact information: [place] [address] [phone]        Festus Aloe, MD Follow up.   Specialty:  Urology Contact information: [place] [address] [phone]  Allergies  Allergen Reactions  . Flounder [Fish Allergy] Other (See Comments)    Pt breaks out in bumps  . Morphine And Related Other (See Comments)    Reaction:  Hallucinations   . Oxycodone Other (See Comments)    Reaction:  Hallucinations  . Sulfonamide Derivatives Swelling and Other (See Comments)    Reaction:  Unspecified swelling reaction     Consultations:  None   Procedures/Studies: Dg Chest 2 View  Result Date: 05/18/2018 CLINICAL DATA:  Altered mental status. Generalized weakness and lethargy for 3 days. EXAM: CHEST - 2 VIEW COMPARISON:  Radiograph 05/10/2018 FINDINGS: Low lung volumes. Unchanged heart size and mediastinal contours. Vague retrocardiac and right suprahilar opacity. Calcified granuloma in the right mid lung. No large pleural effusion. No pneumothorax. No acute osseous abnormalities. IMPRESSION: Vague retrocardiac and right suprahilar opacities, nonspecific. This may represent atelectasis, or pneumonia, including atypical viral infection. Opacities  are new from exam 1 week ago. Electronically Signed   By: Keith Rake M.D.   On: 05/18/2018 19:55   Ct Head Wo Contrast  Result Date: 05/18/2018 CLINICAL DATA:  Encephalopathy. EXAM: CT HEAD WITHOUT CONTRAST TECHNIQUE: Contiguous axial images were obtained from the base of the skull through the vertex without intravenous contrast. COMPARISON:  08/10/2016 FINDINGS: Brain: No intracranial hemorrhage, mass effect, or midline shift. Stable moderate to advanced atrophy. Stable moderate to advanced chronic small vessel ischemia. No hydrocephalus. The basilar cisterns are patent. No evidence of territorial infarct or acute ischemia. No extra-axial or intracranial fluid collection. Vascular: Atherosclerosis of skullbase vasculature without hyperdense vessel or abnormal calcification. Skull: No fracture or suspicious lesion. Small frontal bony excrescence, stable from many years and considered benign. Sinuses/Orbits: Paranasal sinuses and mastoid air cells are clear. The visualized orbits are unremarkable. Bilateral cataract resection. Other: None. IMPRESSION: 1. No acute intracranial abnormality. 2. Unchanged atrophy and chronic small vessel ischemia. Electronically Signed   By: Keith Rake M.D.   On: 05/18/2018 21:19   US Renal  Result Date: 05/10/2018 CLINICAL DATA:  Acute renal failure. EXAM: RENAL / URINARY TRACT ULTRASOUND COMPLETE COMPARISON:  CT abdomen and pelvis 08/10/2016 FINDINGS: Right Kidney: Renal measurements: 10.2 x 4.0 x 4.9 cm = volume: 104 mL. Mildly increased parenchymal echogenicity. No hydronephrosis. 1.1 cm interpolar cyst. Left Kidney: Renal measurements: 10.9 x 4.4 x 4.9 cm = volume: 123 mL. Mildly increased parenchymal echogenicity. No mass or hydronephrosis. Bladder: Decompressed by Foley catheter. Prominent volume echogenic material was incidentally noted in the gallbladder, incompletely evaluated. IMPRESSION: 1. Mildly echogenic kidneys compatible with medical renal disease. 2.  No hydronephrosis. 3. Echogenic material in the gallbladder which may reflect sludge and tiny stones. Electronically Signed   By: Logan Bores M.D.   On: 05/10/2018 17:52   Dg Chest Portable 1 View  Result Date: 05/10/2018 CLINICAL DATA:  Fevers and cough EXAM: PORTABLE CHEST 1 VIEW COMPARISON:  09/17/2016 FINDINGS: Cardiac shadows within normal limits. The lungs are well aerated bilaterally. No focal infiltrate or sizable effusion is seen. No acute bony abnormality is noted. Calcified granuloma is noted in the right mid lung. IMPRESSION: No acute abnormality noted. Electronically Signed   By: Inez Catalina M.D.   On: 05/10/2018 07:47       Subjective: Unable to obtain secondary to mental status  Discharge Exam: Vitals:   05/24/18 0526 05/24/18 1314  BP: (!) 118/52 134/80  Pulse: (!) 58 68  Resp: 16 18  Temp: 97.6 F (36.4 C) 98 F (36.7 C)  SpO2: 100%  100%   Vitals:   05/23/18 1956 05/23/18 2033 05/24/18 0526 05/24/18 1314  BP: (!) 173/113 132/79 (!) 118/52 134/80  Pulse: 99 94 (!) 58 68  Resp: 17 18 16 18   Temp:  99.1 F (37.3 C) 97.6 F (36.4 C) 98 F (36.7 C)  TempSrc:  Oral Oral Oral  SpO2: 100% 100% 100% 100%  Weight:      Height:        General: Pt is alert, awake, not in acute distress Cardiovascular: RRR, S1/S2 +, no rubs, no gallops Respiratory: CTA bilaterally, no wheezing, no rhonchi Abdominal: Soft, NT, ND, bowel sounds + Extremities: Trace LE edema, no cyanosis    The results of significant diagnostics from this hospitalization (including imaging, microbiology, ancillary and laboratory) are listed below for reference.     Microbiology: Recent Results (from the past 240 hour(s))  Novel Coronavirus, NAA (hospital order; send-out to ref lab)     Status: None   Collection Time: 05/18/18  9:33 PM  Result Value Ref Range Status   SARS-CoV-2, NAA NOT DETECTED NOT DETECTED Final    Comment: (NOTE) This test was developed and its performance  characteristics determined by [employer]. This test has not been FDA cleared or approved. This test has been authorized by FDA under an Emergency Use Authorization (EUA). This test has been validated in accordance with the FDA's Guidance Document (Policy for [place] in Laboratories Certified to Perform High Complexity Testing under CLIA prior to Emergency Use Authorization for Coronavirus [id number] during the [facility] Emergency) issued on February 29th, 2020. FDA independent review of this validation is pending. This test is only authorized for the duration of time the declaration that circumstances exist justifying the authorization of the emergency use of in vitro diagnostic tests for detection of SARS-CoV- 2 virus and/or diagnosis of COVID-19 infection under section 564(b)(1) of the Act, 21 U.S.C. 921JHE-1(D)(4), unless the authorization is terminated or revoked sooner. Performed At: [facility] [place], [place] [id number] Rush Farmer MD UD:[id number]    [place]  Final    Comment: Performed at [facility] [address], [place], [place] [address]     Labs: BNP (last 3 results) No results for input(s): BNP in the last 8760 hours. Basic Metabolic Panel: Recent Labs  Lab 05/18/18 1900 05/19/18 0723 05/20/18 0519 05/22/18 0346 05/23/18 1627  NA 140 140 140  --  136  K 3.2* 3.1* 3.9  --  4.1  CL 107 109 108  --  103  CO2 24 23 23   --  23  GLUCOSE 205* 171* 156*  --  192*  BUN 8 7* 8  --  14  CREATININE 0.64 0.54 0.57 0.64 0.74  CALCIUM 9.1 8.6* 9.2  --  9.4  MG  --   --  1.9  --   --    Liver Function Tests: Recent Labs  Lab 05/18/18 1900 05/19/18 0723 05/23/18 1627  AST 15 14* 13*  ALT 16 14 10   ALKPHOS 71 58 69  BILITOT 0.4 0.8 0.5  PROT 7.0 6.1* 6.9  ALBUMIN 3.1* 2.8* 3.1*   No results for input(s): LIPASE, AMYLASE in the last 168 hours. Recent Labs   Lab 05/23/18 1627  AMMONIA 15   CBC: Recent Labs  Lab 05/18/18 1900 05/19/18 0723  WBC 6.7 5.1  NEUTROABS 4.5  --   HGB 12.2 10.8*  HCT 39.8 34.7*  MCV 85.2 83.6  PLT 324  260   Cardiac Enzymes: No results for input(s): CKTOTAL, CKMB, CKMBINDEX, TROPONINI in the last 168 hours. BNP: Invalid input(s): POCBNP CBG: Recent Labs  Lab 05/23/18 1213 05/23/18 1825 05/23/18 2140 05/24/18 0806 05/24/18 1131  GLUCAP 205* 213* 272* 146* 219*   D-Dimer No results for input(s): DDIMER in the last 72 hours. Hgb A1c No results for input(s): HGBA1C in the last 72 hours. Lipid Profile No results for input(s): CHOL, HDL, LDLCALC, TRIG, CHOLHDL, LDLDIRECT in the last 72 hours. Thyroid function studies No results for input(s): TSH, T4TOTAL, T3FREE, THYROIDAB in the last 72 hours.  Invalid input(s): FREET3 Anemia work up No results for input(s): VITAMINB12, FOLATE, FERRITIN, TIBC, IRON, RETICCTPCT in the last 72 hours. Urinalysis    Component Value Date/Time   COLORURINE YELLOW 05/18/2018 1900   APPEARANCEUR CLEAR 05/18/2018 1900   LABSPEC 1.010 05/18/2018 1900   PHURINE 6.0 05/18/2018 1900   GLUCOSEU >=500 (A) 05/18/2018 1900   HGBUR SMALL (A) 05/18/2018 1900   BILIRUBINUR NEGATIVE 05/18/2018 1900   KETONESUR NEGATIVE 05/18/2018 1900   PROTEINUR 100 (A) 05/18/2018 1900   UROBILINOGEN 0.2 07/20/2014 2228   NITRITE NEGATIVE 05/18/2018 1900   LEUKOCYTESUR SMALL (A) 05/18/2018 1900   Sepsis Labs Invalid input(s): PROCALCITONIN,  WBC,  LACTICIDVEN Microbiology Recent Results (from the past 240 hour(s))  Novel Coronavirus, NAA (hospital order; send-out to ref lab)     Status: None   Collection Time: 05/18/18  9:33 PM  Result Value Ref Range Status   SARS-CoV-2, NAA NOT DETECTED NOT DETECTED Final    Comment: (NOTE) This test was developed and its performance characteristics determined by [employer]. This test has not been FDA cleared or approved. This test has been  authorized by FDA under an Emergency Use Authorization (EUA). This test has been validated in accordance with the FDA's Guidance Document (Policy for [place] in Laboratories Certified to Perform High Complexity Testing under CLIA prior to Emergency Use Authorization for Coronavirus [id number] during the [facility] Emergency) issued on February 29th, 2020. FDA independent review of this validation is pending. This test is only authorized for the duration of time the declaration that circumstances exist justifying the authorization of the emergency use of in vitro diagnostic tests for detection of SARS-CoV- 2 virus and/or diagnosis of COVID-19 infection under section 564(b)(1) of the Act, 21 U.S.C. 086PYP-9(J)(0), unless the authorization is terminated or revoked sooner. Performed At: [facility] [place], [place] [id number] Rush Farmer MD YK:[id number]    [place]  Final    Comment: Performed at [facility] [address], [place], [place] [address]     SIGNED:   Cordelia Poche, MD [facility] 05/24/2018, 1:42 PM

## 2018-05-24 NOTE — Progress Notes (Signed)
Pt bladder scanned 31ml noted MD made aware. SRP,RN

## 2018-06-12 ENCOUNTER — Other Ambulatory Visit: Payer: Self-pay

## 2018-06-12 ENCOUNTER — Emergency Department (HOSPITAL_COMMUNITY)
Admission: EM | Admit: 2018-06-12 | Discharge: 2018-06-12 | Disposition: A | Discharge status: Home / Self Care | Payer: Medicare Other | Attending: Emergency Medicine | Admitting: Emergency Medicine

## 2018-06-12 ENCOUNTER — Emergency Department (HOSPITAL_COMMUNITY): Payer: Medicare Other

## 2018-06-12 ENCOUNTER — Encounter (HOSPITAL_COMMUNITY): Payer: Self-pay | Admitting: Emergency Medicine

## 2018-06-12 DIAGNOSIS — I10 Essential (primary) hypertension: Secondary | ICD-10-CM | POA: Diagnosis not present

## 2018-06-12 DIAGNOSIS — R000 Tachycardia, unspecified: Secondary | ICD-10-CM | POA: Diagnosis not present

## 2018-06-12 DIAGNOSIS — E11622 Type 2 diabetes mellitus with other skin ulcer: Secondary | ICD-10-CM

## 2018-06-12 DIAGNOSIS — Z7984 Long term (current) use of oral hypoglycemic drugs: Secondary | ICD-10-CM | POA: Insufficient documentation

## 2018-06-12 DIAGNOSIS — I471 Supraventricular tachycardia: Secondary | ICD-10-CM | POA: Insufficient documentation

## 2018-06-12 DIAGNOSIS — Z79899 Other long term (current) drug therapy: Secondary | ICD-10-CM | POA: Diagnosis not present

## 2018-06-12 DIAGNOSIS — R009 Unspecified abnormalities of heart beat: Secondary | ICD-10-CM | POA: Diagnosis present

## 2018-06-12 DIAGNOSIS — Z7982 Long term (current) use of aspirin: Secondary | ICD-10-CM | POA: Insufficient documentation

## 2018-06-12 DIAGNOSIS — F0390 Unspecified dementia without behavioral disturbance: Secondary | ICD-10-CM | POA: Diagnosis not present

## 2018-06-12 DIAGNOSIS — E119 Type 2 diabetes mellitus without complications: Secondary | ICD-10-CM | POA: Insufficient documentation

## 2018-06-12 LAB — CBC WITH DIFFERENTIAL/PLATELET
Abs Immature Granulocytes: 0.03 10*3/uL (ref 0.00–0.07)
Basophils Absolute: 0 10*3/uL (ref 0.0–0.1)
Basophils Relative: 0 %
Eosinophils Absolute: 0 10*3/uL (ref 0.0–0.5)
Eosinophils Relative: 0 %
HCT: 40.2 % (ref 36.0–46.0)
Hemoglobin: 12.3 g/dL (ref 12.0–15.0)
Immature Granulocytes: 0 %
Lymphocytes Relative: 16 %
Lymphs Abs: 1.4 10*3/uL (ref 0.7–4.0)
MCH: 25.3 pg — ABNORMAL LOW (ref 26.0–34.0)
MCHC: 30.6 g/dL (ref 30.0–36.0)
MCV: 82.5 fL (ref 80.0–100.0)
Monocytes Absolute: 0.3 10*3/uL (ref 0.1–1.0)
Monocytes Relative: 4 %
Neutro Abs: 7.1 10*3/uL (ref 1.7–7.7)
Neutrophils Relative %: 80 %
Platelets: 376 10*3/uL (ref 150–400)
RBC: 4.87 MIL/uL (ref 3.87–5.11)
RDW: 15.6 % — ABNORMAL HIGH (ref 11.5–15.5)
WBC: 8.8 10*3/uL (ref 4.0–10.5)
nRBC: 0 % (ref 0.0–0.2)

## 2018-06-12 LAB — COMPREHENSIVE METABOLIC PANEL
ALT: 11 U/L (ref 0–44)
AST: 17 U/L (ref 15–41)
Albumin: 3.2 g/dL — ABNORMAL LOW (ref 3.5–5.0)
Alkaline Phosphatase: 63 U/L (ref 38–126)
Anion gap: 13 (ref 5–15)
BUN: 16 mg/dL (ref 8–23)
CO2: 21 mmol/L — ABNORMAL LOW (ref 22–32)
Calcium: 10.2 mg/dL (ref 8.9–10.3)
Chloride: 110 mmol/L (ref 98–111)
Creatinine, Ser: 0.76 mg/dL (ref 0.44–1.00)
GFR calc Af Amer: >60 mL/min (ref >60)
GFR calc non Af Amer: >60 mL/min (ref >60)
Glucose, Bld: 284 mg/dL — ABNORMAL HIGH (ref 70–99)
Potassium: 3.9 mmol/L (ref 3.5–5.1)
Sodium: 144 mmol/L (ref 135–145)
Total Bilirubin: 0.7 mg/dL (ref 0.3–1.2)
Total Protein: 7.1 g/dL (ref 6.5–8.1)

## 2018-06-12 LAB — URINALYSIS, ROUTINE W REFLEX MICROSCOPIC
Bilirubin Urine: NEGATIVE
Glucose, UA: >=500 mg/dL — AB
Ketones, ur: NEGATIVE mg/dL
Leukocytes,Ua: NEGATIVE
Nitrite: NEGATIVE
Protein, ur: 30 mg/dL — AB
Specific Gravity, Urine: 1.013 (ref 1.005–1.030)
pH: 7 (ref 5.0–8.0)

## 2018-06-12 LAB — TROPONIN I: Troponin I: <0.03 ng/mL (ref <0.03)

## 2018-06-12 MED ORDER — METOPROLOL SUCCINATE ER 50 MG PO TB24: 100.0000 mg | ORAL_TABLET | Freq: Every day | ORAL | 0 refills | Status: DC

## 2018-06-12 NOTE — Discharge Instructions (Signed)
[name] heart rate was high before she came to the Emergency Department.  Please increase her Metoprolol to 100mg  once daily.  Have her rechecked if she has new or concerning symptoms.    Her foley catheter was changed today.

## 2018-06-12 NOTE — Consult Note (Addendum)
Cardiology Consultation:   Patient ID: [name] MRN: [id number]; DOB: [birth year]  Admit date: 06/12/2018 Date of Consult: 06/12/2018  Primary Care Provider: [name], MD Primary Cardiologist: No primary care provider on file. new Primary Electrophysiologist:  None    Patient Profile:   [name] is a 78 y.o. female with a hx of recent hospitalizations with sepsis hypotension, UTI, dementia, HTN, sage IV sacral decubitus,periprosthetic right knee fracture and bedbound, foley for urinary retention who is being seen today for the evaluation of SVT at the request of Dr. Ralene [name].  History of Present Illness:   Ms. [name] with above hx was at urology to have foley removed and was found to have HR in 40s per note but with  EMS strips with HR 170 and T wave depression in V3-V6 and I, III.  A flutter prob.  She was in SR with PACs on EKG on arrival at [facility].   Looking back at tele from prior hospitalizations she had similar rhythm on 05/23/18 and brief PAT in March  CHA2DS2VASc of 5.  Hx of Echo 2012  With EF 55%, G1 DD mildly dilated LA and echo 06/2014 with EF 60-65% G1DD, mild LVH, LA normal in size, dilated IVC.  Stress test lexiscan 05/2010 was normal.    Na 144, K+ 3.9, glucose 284, Cr 0.76, LFTs normal.  tropon <0.03  Hgb 12.3, plts 376 WBC 8.8   Urine with >500 glucose, sm hgb 30 Gm protein CXR 1 V No edema or consolidation.  Stable cardiac silhouette  Currently HR by pulse 40 but with EKG 81  resp 26 to 22. BP 153/98 pt lying flat no chest pain but did not really answer questions.   Past Medical History:  Diagnosis Date   Anxiety    Depression    Diabetes mellitus    Dyspnea    GERD (gastroesophageal reflux disease)    Headache(784.0)    HTN (hypertension)    dr [name]   Hypercholesteremia    DENIES   Osteoarthritis    PONV (postoperative nausea and vomiting)    RLS (restless legs syndrome)    Stress    UTI (urinary tract infection)  05/27/2014    Past Surgical History:  Procedure Laterality Date   ABDOMINAL HYSTERECTOMY     COLONOSCOPY     JOINT REPLACEMENT     left forearm fracture with ORIF Left    LUMBAR LAMINECTOMY/DECOMPRESSION MICRODISCECTOMY N/A 04/18/2012   Procedure: LUMBAR LAMINECTOMY/DECOMPRESSION MICRODISCECTOMY 3 LEVELS;  Surgeon: Michelle Cunas, MD;  Location: [facility];  Service: Neurosurgery;  Laterality: N/A;  Lumbar three-four, lumbar four-five, lumbar five-sacral one decompression. Synovial cyst resection lumbar four-five   ORIF PERIPROSTHETIC FRACTURE Right 07/06/2016   Procedure: OPEN REDUCTION INTERNAL FIXATION (ORIF) PERIPROSTHETIC FRACTURE;  Surgeon: Michelle Arabian, MD;  Location: [facility];  Service: Orthopedics;  Laterality: Right;   REPLACEMENT TOTAL KNEE Right    WISDOM TOOTH EXTRACTION       Home Medications:  Prior to Admission medications   Medication Sig Start Date End Date Taking? Authorizing Provider  acetaminophen (TYLENOL) 500 MG tablet Take 500 mg by mouth every 8 (eight) hours as needed for mild pain.    [provider]  ALPRAZolam Duanne Moron) 0.25 MG tablet Take 1 tablet (0.25 mg total) by mouth at bedtime as needed for anxiety. 05/24/18   Michelle Aloe, MD  amLODipine (NORVASC) 5 MG tablet Take 1 tablet (5 mg total) by mouth daily. 09/12/16   Michelle Phi,  DO  aspirin 325 MG EC tablet Take 325 mg by mouth daily.    [provider]  divalproex (DEPAKOTE SPRINKLE) 125 MG capsule Take 125 mg by mouth every evening.  02/08/18   [provider]  GLYXAMBI 10-5 MG TABS Take 1 tablet by mouth daily. 03/20/18   [provider]  Infant Care Products (DERMACLOUD) CREA Apply 1 application topically as needed (each change).    [provider]  loratadine-pseudoephedrine (CLARITIN-D 12-HOUR) 5-120 MG tablet Take 1 tablet by mouth 2 (two) times daily as needed for allergies.    [provider]  losartan (COZAAR) 25 MG tablet Take 100 mg by  mouth daily.    [provider]  Magnesium Oxide 500 MG TABS Take 500 mg by mouth daily.     [provider]  metFORMIN (GLUCOPHAGE) 500 MG tablet Take 500 mg by mouth 2 (two) times daily with a meal.    [provider]  metoprolol succinate (TOPROL-XL) 50 MG 24 hr tablet Take 1 tablet (50 mg total) by mouth daily. 07/04/14   Michelle Lis, MD  mirtazapine (REMERON) 15 MG tablet Take 15 mg by mouth at bedtime. 02/27/18   [provider]  omeprazole (PRILOSEC) 40 MG capsule Take 40 mg by mouth daily. 03/09/18   [provider]  ondansetron (ZOFRAN) 4 MG tablet Take 4 mg by mouth every 6 (six) hours as needed for nausea/vomiting. 02/13/18   [provider]  pramipexole (MIRAPEX) 1.5 MG tablet Take 1.5 mg by mouth 3 (three) times daily.    [provider]    Inpatient Medications: Scheduled Meds:  Continuous Infusions:  PRN Meds:   Allergies:    Allergies  Allergen Reactions   Flounder [Fish Allergy] Other (See Comments)    Pt breaks out in bumps   Morphine And Related Other (See Comments)    Reaction:  Hallucinations    Oxycodone Other (See Comments)    Reaction:  Hallucinations   Sulfonamide Derivatives Swelling and Other (See Comments)    Reaction:  Unspecified swelling reaction     Social History:   Social History   Socioeconomic History   Marital status: Married    Spouse name: [name]   Number of children: 1   Years of education: college   Highest education level: Not on file  Occupational History    Comment: retired  Scientist, product/process development strain: Not on file   Food insecurity:    Worry: Not on file    Inability: Not on Lexicographer needs:    Medical: Not on file    Non-medical: Not on file  Tobacco Use   Smoking status: Never Smoker   Smokeless tobacco: Never Used  Substance and Sexual Activity   Alcohol use: No    Alcohol/week: 0.0 standard drinks   Drug use: No     Sexual activity: Not Currently  Lifestyle   Physical activity:    Days per week: Not on file    Minutes per session: Not on file   Stress: Not on file  Relationships   Social connections:    Talks on phone: Not on file    Gets together: Not on file    Attends religious service: Not on file    Active member of club or organization: Not on file    Attends meetings of clubs or organizations: Not on file    Relationship status: Not on file   Intimate partner violence:  Fear of current or ex partner: Not on file    Emotionally abused: Not on file    Physically abused: Not on file    Forced sexual activity: Not on file  Other Topics Concern   Not on file  Social History Narrative   Patient lives at home with her husband [name]).   Education- college    Right handed.   Caffeine- soda half cup daily.   Retired.    Family History:    Family History  Problem Relation Age of Onset   Diabetes Father    High blood pressure Father    High blood pressure Mother    Colon cancer Neg Hx    Esophageal cancer Neg Hx    Liver disease Neg Hx    Stomach cancer Neg Hx    Pancreatic cancer Neg Hx      ROS:  Please see the history of present illness.  General:no colds or fevers, no weight changes though pt mostly does not answer questions.  Skin:no rashes or ulcers HEENT:no blurred vision, no congestion that pt could tell me about  CV:see HPI PUL:see HPI GI:no diarrhea constipation or melena, no indigestion GU:no hematuria, no dysuria- has foley for urinary retention.  MS:no joint pain, no claudication Neuro:no syncope, no lightheadedness Endo:+ diabetes with elevated glucose, no thyroid disease  All other ROS reviewed and negative.     Physical Exam/Data:   Vitals:   06/12/18 1330 06/12/18 1345 06/12/18 1403 06/12/18 1415  BP: (!) 150/82 (!) 157/79 (!) 159/82 (!) 153/98  Pulse:   (!) 47 (!) 40  Resp: (!) 25 (!) 26 (!) 24 (!) 22  Temp:      TempSrc:       SpO2:   96% 100%  Weight:      Height:       No intake or output data in the 24 hours ending 06/12/18 1419 Last 3 Weights 06/12/2018 05/18/2018 05/11/2018  Weight (lbs) 188 lb 7.9 oz 188 lb 7.9 oz 188 lb 7.9 oz  Weight (kg) 85.5 kg 85.5 kg 85.5 kg     Body mass index is 29.52 kg/m.   Per Dr. [place] General:  Well nourished, well developed, in no acute distress HEENT: normal Lymph: no adenopathy Neck: no JVD Endocrine:  No thryomegaly Vascular: No carotid bruits; FA pulses 2+ bilaterally without bruits  Cardiac:  normal S1, S2; RRR with extrasystoles; no murmur -per Dr. [place] Lungs:  clear to auscultation bilaterally, no wheezing, rhonchi or rales  Abd: soft, nontender, no hepatomegaly  Ext: no edema Musculoskeletal:  No deformities, BUE and BLE strength normal and equal Skin: warm and dry, stage IV sacral decubitus ulcer Neuro:  Not answering questions consistently  no focal abnormalities noted Psych:  Normal affect    Relevant CV Studies: Echo 2016  Study Conclusions  - Left ventricle: The cavity size was normal. Wall thickness was   increased in a pattern of mild LVH. Systolic function was normal.   The estimated ejection fraction was in the range of 60% to 65%.   Wall motion was normal; there were no regional wall motion   abnormalities. Doppler parameters are consistent with abnormal   left ventricular relaxation (grade 1 diastolic dysfunction). The   E/e&' ratio is between 8-15, suggesting indeterminate LV filling   pressure. - Aortic valve: Trileaflet. Sclerosis without stenosis. There was   trivial regurgitation. - Mitral valve: Mildly thickened leaflets . There was trivial   regurgitation. - Left atrium: The  atrium was normal in size. - Right atrium: The atrium was mildly dilated. - Tricuspid valve: There was no significant regurgitation. - Inferior vena cava: The vessel was dilated. The respirophasic   diameter changes were blunted (< 50%), consistent with  elevated   central venous pressure - or possibly due to positive pressure   ventilation.  Impressions:  - Compared to a prior echo in 2012, there is now mild LVH, the EF   is higher at 60-65%, there is trivial AI and a dilated IVC   (possibly due to positive pressure ventilation).  Laboratory Data:  Chemistry Recent Labs  Lab 06/12/18 1029  NA 144  K 3.9  CL 110  CO2 21*  GLUCOSE 284*  BUN 16  CREATININE 0.76  CALCIUM 10.2  GFRNONAA >60  GFRAA >60  ANIONGAP 13    Recent Labs  Lab 06/12/18 1029  PROT 7.1  ALBUMIN 3.2*  AST 17  ALT 11  ALKPHOS 63  BILITOT 0.7   Hematology Recent Labs  Lab 06/12/18 1125  WBC 8.8  RBC 4.87  HGB 12.3  HCT 40.2  MCV 82.5  MCH 25.3*  MCHC 30.6  RDW 15.6*  PLT 376   Cardiac Enzymes Recent Labs  Lab 06/12/18 1029  TROPONINI <0.03   No results for input(s): TROPIPOC in the last 168 hours.  BNPNo results for input(s): BNP, PROBNP in the last 168 hours.  DDimer No results for input(s): DDIMER in the last 168 hours.  Radiology/Studies:  Dg Chest Port 1 View  Result Date: 06/12/2018 CLINICAL DATA:  Cardiac arrhythmia EXAM: PORTABLE CHEST 1 VIEW COMPARISON:  May 18, 2018 FINDINGS: There is no edema or consolidation. Heart is upper normal in size with pulmonary vascularity normal. No adenopathy. No pneumothorax. No bone lesions. IMPRESSION: No edema or consolidation.  Stable cardiac silhouette. Electronically Signed   By: Lowella Grip III M.D.   On: 06/12/2018 11:01    Assessment and Plan:   1. SVT HR at 170 with ST depression most likely rate related and neg troponin other times her HR has been noted in 40s but this may be due to PACs.  She is on toprol XL 50 mg daily. Dr. Martinique to see, ? Increase BB.  If a flutter and Cha2DS2VASc of 5 would need anticoagulation but she also has decubitus ulcer which may lead to increase chance of bleeding. 2. Recent sepsis with PNA, chest xray stable no PNA 3. DM-2 with elevated  glucose and on metformin  4. HTN elevated on arrival on amlodipine 5 daily, losartan 25 daily  5. Urinary retention with cath followed by urology 6. Decubitus at last discharge 7. Dementia with AMS on last hospitalization   8. Anxiety depression on Mirapex, remeron and xanax       For questions or updates, please contact [facility] Please consult [email or web address] for contact info under     Signed, Cecilie Kicks, NP  06/12/2018 2:19 PM  Patient seen and examined and history reviewed. Agree with above findings and plan. 78 yo female with severe dementia, stage IV sacral decubitus, DM, HTN, bedbound seen for evaluation of SVT. EMS found her to have slow pulse but telemetry revealed SVT rate 170-175. Converted to NSR by the time she got to ED. Patient denies any chest pain, dyspnea, palpitations or dizziness. Of note review of hospital records reveal same arrhythmia on 3/313 and 05/23/18 that were not addressed in progress notes or discharge summary.   My exam is as noted above.  Ecg here and from EMS noted.  Labs reviewed  Impression: 1. SVT possibly atrial flutter but unclear. Now converted to NSR. Similar rhythm in hospital earlier this month. No clear symptoms. She is hypertensive. Given comorbidities I think conservative management is called for. Will increase metoprolol to 100 mg daily. She is a poor candidate for anticoagulation. I would not recommend an Echo at this time given pandemic and need to limit staff exposure and this would not change therapy. I think she could follow up with primary care. No other cardiac evaluation planned.    Khary Schaben [place], [place] 06/12/2018 3:17 PM

## 2018-06-12 NOTE — ED Triage Notes (Signed)
Per EMS: pt was heading to Urology for possible catheter removal by [facility], noticed the pt's pulse was in the 40's and irregular.  EMS was called at that time. Pt HR in the 80's with EMS and presenting with abnormal rhythms. Pt is mentating ar baseline.    EMS Vitas:   BP 142/98 HR 80's to 140's Sp02  97% RA CBG 359

## 2018-06-12 NOTE — ED Provider Notes (Signed)
[facility] EMERGENCY DEPARTMENT Provider Note   CSN: [id number] Arrival date & time: 06/12/18  1021    History   Chief Complaint No chief complaint on file.   HPI [name] is a 78 y.o. female.     The history is provided by medical records and the EMS personnel. No language interpreter was used.   [name] is a 78 y.o. female who presents to the Emergency Department complaining of abnormal heart rhythm. Level V caveat due to altered mental status and dementia. History is provided by EMS and medical records. Per EMS patient is coming from home. [facility] was picking the patient up to take her to a urology appointment for chronic indwelling Foley catheter. When they checked her vital signs they noted that she was at times bradycardic and other times tachycardic. Heart rates range from 40s to 190s. No reports of fevers, vomiting, chest pain, abdominal pain.  Rhythm strips reviewed by EMS prior to arrival are significant for atrial flutter with varying rates as well as sinus rhythm.  Past Medical History:  Diagnosis Date  . Anxiety   . Depression   . Diabetes mellitus   . Dyspnea   . GERD (gastroesophageal reflux disease)   . Headache(784.0)   . HTN (hypertension)    dr Johnsie Cancel  . Hypercholesteremia    DENIES  . Osteoarthritis   . PONV (postoperative nausea and vomiting)   . RLS (restless legs syndrome)   . Stress   . UTI (urinary tract infection) 05/27/2014    Patient Active Problem List   Diagnosis Date Noted  . SVT (supraventricular tachycardia) ([place])   . Altered mental status   . Community acquired pneumonia of left upper lobe of lung ([place]) 05/18/2018  . Pressure injury of skin 05/11/2018  . Hypertensive urgency 09/11/2016  . Dental abscess 09/11/2016  . Hypokalemia 09/11/2016  . 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 Acute post-hemorrhagic anemia 07/09/2016  . Symptomatic anemia 07/09/2016  . Periprosthetic fracture around internal  prosthetic right knee joint 07/04/2016  . Abdominal pain, chronic, epigastric 08/10/2015  . Dehydration   . Leukocytosis   . Aspiration pneumonia ([place]) 07/21/2014  . DM type 2 (diabetes mellitus, type 2) ([place]) 07/21/2014  . Palliative care encounter 07/21/2014  . Dysphagia 07/09/2014  . Decubitus ulcer 07/09/2014  . Blood poisoning   . Acute respiratory failure with hypoxia ([place]) 06/30/2014  . Septic shock ([place]) 06/30/2014  . Lactic acidosis 06/30/2014  . Dementia ([place]) 06/30/2014  . Acute respiratory failure with hypoxemia ([place])   . Hypernatremia   . Acute kidney injury ([place]) 05/28/2014  . Fever 05/27/2014  . Sepsis ([place]) 05/27/2014  . Acute encephalopathy   . FTT (failure to thrive) in adult   . Gait instability 07/18/2013    Class: Acute  . Low back pain 07/18/2013    Class: Acute  . Nausea with vomiting 07/18/2013    Class: Acute  . Abnormality of gait 04/29/2013  . 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 Head trauma 06/03/2010  . HYPERCHOLESTEROLEMIA 05/15/2008  . ANXIETY DEPRESSION 05/15/2008  . DEPRESSION 05/15/2008  . Essential hypertension 05/15/2008  . OSTEOARTHRITIS 05/15/2008  . Edema 05/15/2008  . DYSPNEA ON EXERTION 05/15/2008  . ELECTROCARDIOGRAM, ABNORMAL 05/15/2008    Past Surgical History:  Procedure Laterality Date  . ABDOMINAL HYSTERECTOMY    . COLONOSCOPY    . JOINT REPLACEMENT    . left forearm fracture with ORIF Left   . LUMBAR  LAMINECTOMY/DECOMPRESSION MICRODISCECTOMY N/A 04/18/2012   Procedure: LUMBAR LAMINECTOMY/DECOMPRESSION MICRODISCECTOMY 3 LEVELS;  Surgeon: Winfield Cunas, MD;  Location: [facility];  Service: Neurosurgery;  Laterality: N/A;  Lumbar three-four, lumbar four-five, lumbar five-sacral one decompression. Synovial cyst resection lumbar four-five  . ORIF PERIPROSTHETIC FRACTURE Right 07/06/2016   Procedure: OPEN REDUCTION INTERNAL FIXATION (ORIF) PERIPROSTHETIC FRACTURE;  Surgeon: Gaynelle Arabian, MD;  Location: [facility];  Service: Orthopedics;  Laterality: Right;  . REPLACEMENT TOTAL KNEE Right   . WISDOM TOOTH EXTRACTION       OB History   No obstetric history on file.      Home Medications    Prior to Admission medications   Medication Sig Start Date End Date Taking? Authorizing Provider  acetaminophen (TYLENOL) 500 MG tablet Take 500 mg by mouth every 8 (eight) hours as needed for mild pain.    [provider]  ALPRAZolam Duanne Moron) 0.25 MG tablet Take 1 tablet (0.25 mg total) by mouth at bedtime as needed for anxiety. 05/24/18   Mariel Aloe, MD  amLODipine (NORVASC) 5 MG tablet Take 1 tablet (5 mg total) by mouth daily. 09/12/16   Dessa Phi, DO  aspirin 325 MG EC tablet Take 325 mg by mouth daily.    [provider]  divalproex (DEPAKOTE SPRINKLE) 125 MG capsule Take 125 mg by mouth every evening.  02/08/18   [provider]  GLYXAMBI 10-5 MG TABS Take 1 tablet by mouth daily. 03/20/18   [provider]  Infant Care Products (DERMACLOUD) CREA Apply 1 application topically as needed (each change).    [provider]  loratadine-pseudoephedrine (CLARITIN-D 12-HOUR) 5-120 MG tablet Take 1 tablet by mouth 2 (two) times daily as needed for allergies.    [provider]  losartan (COZAAR) 25 MG tablet Take 100 mg by mouth daily.    [provider]  Magnesium Oxide 500 MG TABS Take 500 mg by mouth daily.     [provider]  metFORMIN (GLUCOPHAGE) 500 MG tablet Take 500 mg by mouth 2 (two) times daily with a meal.    [provider]  metoprolol succinate (TOPROL-XL) 50 MG 24 hr tablet Take 2 tablets (100 mg total) by mouth daily. 06/12/18   Quintella Reichert, MD  mirtazapine (REMERON) 15 MG tablet Take 15 mg by mouth at bedtime. 02/27/18   [provider]  omeprazole (PRILOSEC) 40 MG capsule Take 40 mg by mouth daily. 03/09/18   [provider]  ondansetron (ZOFRAN) 4 MG tablet Take 4  mg by mouth every 6 (six) hours as needed for nausea/vomiting. 02/13/18   [provider]  pramipexole (MIRAPEX) 1.5 MG tablet Take 1.5 mg by mouth 3 (three) times daily.    [provider]    Family History Family History  Problem Relation Age of Onset  . Diabetes Father   . High blood pressure Father   . High blood pressure Mother   . Colon cancer Neg Hx   . Esophageal cancer Neg Hx   . Liver disease Neg Hx   . Stomach cancer Neg Hx   . Pancreatic cancer Neg Hx     Social History Social History   Tobacco Use  . Smoking status: Never Smoker  . Smokeless tobacco: Never Used  Substance Use Topics  . Alcohol use: No    Alcohol/week: 0.0 standard drinks  . Drug use: No     Allergies   Flounder [fish allergy]; Morphine and related; Oxycodone; and Sulfonamide  derivatives   Review of Systems Review of Systems  All other systems reviewed and are negative.    Physical Exam Updated Vital Signs BP (!) 170/85   Pulse (!) 43   Temp 98.2 F (36.8 C) (Rectal)   Resp (!) 23   Ht 5\' 7"  (1.702 m)   Wt 85.5 kg   SpO2 96%   BMI 29.52 kg/m   Physical Exam Vitals signs and nursing note reviewed.  Constitutional:      Appearance: She is well-developed.  HENT:     Head: Normocephalic and atraumatic.  Cardiovascular:     Rate and Rhythm: Normal rate and regular rhythm.     Heart sounds: No murmur.  Pulmonary:     Effort: Pulmonary effort is normal. No respiratory distress.     Breath sounds: Normal breath sounds.  Abdominal:     Palpations: Abdomen is soft.     Tenderness: There is no abdominal tenderness. There is no guarding or rebound.  Musculoskeletal:        General: No tenderness.  Skin:    General: Skin is warm and dry.  Neurological:     Mental Status: She is alert and oriented to person, place, and time.  Psychiatric:        Behavior: Behavior normal.      ED Treatments / Results  Labs (all labs ordered are listed, but only abnormal  results are displayed) Labs Reviewed  COMPREHENSIVE METABOLIC PANEL - Abnormal; Notable for the following components:      Result Value   CO2 21 (*)    Glucose, Bld 284 (*)    Albumin 3.2 (*)    All other components within normal limits  CBC WITH DIFFERENTIAL/PLATELET - Abnormal; Notable for the following components:   MCH 25.3 (*)    RDW 15.6 (*)    All other components within normal limits  URINALYSIS, ROUTINE W REFLEX MICROSCOPIC - Abnormal; Notable for the following components:   Color, Urine STRAW (*)    Glucose, UA >=500 (*)    Hgb urine dipstick SMALL (*)    Protein, ur 30 (*)    Bacteria, UA RARE (*)    All other components within normal limits  URINE CULTURE  TROPONIN I  CBC WITH DIFFERENTIAL/PLATELET    EKG EKG Interpretation  Date/Time:  Tuesday June 12 2018 10:29:59 EDT Ventricular Rate:  75 PR Interval:    QRS Duration: 98 QT Interval:  409 QTC Calculation: 457 R Axis:   -37 Text Interpretation:  Sinus rhythm Multiform ventricular premature complexes Abnormal R-wave progression, early transition LVH with secondary repolarization abnormality Confirmed by Quintella Reichert [id number]) on 06/12/2018 10:39:38 AM Also confirmed by Quintella Reichert [phone]), editor Philomena Doheny [phone])  on 06/12/2018 3:24:03 PM   Radiology Dg Chest Port 1 View  Result Date: 06/12/2018 CLINICAL DATA:  Cardiac arrhythmia EXAM: PORTABLE CHEST 1 VIEW COMPARISON:  May 18, 2018 FINDINGS: There is no edema or consolidation. Heart is upper normal in size with pulmonary vascularity normal. No adenopathy. No pneumothorax. No bone lesions. IMPRESSION: No edema or consolidation.  Stable cardiac silhouette. Electronically Signed   By: Lowella Grip III M.D.   On: 06/12/2018 11:01    Procedures Procedures (including critical care time)  Medications Ordered in ED Medications - No data to display   Initial Impression / Assessment and Plan / ED Course  I have reviewed the triage vital signs and  the nursing notes.  Pertinent labs & imaging results that were available  during my care of the patient were reviewed by me and considered in my medical decision making (see chart for details).        Patient presents from home for evaluation of relatively asymptomatic arrhythmia with bradycardia as well as tachycardia Strips reviewed by EMS prior to arrival, concern for a flutter versus SVT. She appears asymptomatic during her ED stay. Her Foley catheter did have significant sediment in it, this was exchanged in the emergency department. Current presentation is not consistent with UTI. Labs are near her baseline. Patient was evaluated by cardiology. Plan to discharge home with increase in her Toprol dosing. Discussed with patient and daughter home care and return precautions.  Presentation is not c/w sepsis, PE, CHF, ACS.    Final Clinical Impressions(s) / ED Diagnoses   Final diagnoses:  [place]    ED Discharge Orders         Ordered    metoprolol succinate (TOPROL-XL) 50 MG 24 hr tablet  Daily     06/12/18 1534           Quintella Reichert, MD 06/12/18 1559

## 2018-06-13 ENCOUNTER — Telehealth: Payer: Self-pay | Admitting: Cardiovascular Disease

## 2018-06-13 LAB — URINE CULTURE: Culture: NO GROWTH

## 2018-06-13 NOTE — Telephone Encounter (Signed)
Patient's daughter called stating her mother was in the ER yesterday (notes in Epic) they advised her for her mother to be seen by her cardiologist. She wants her mother to come in to be seen by Dr. Johnsie Cancel.

## 2018-06-13 NOTE — Telephone Encounter (Signed)
Patient was consulted by Dr. Martinique yesterday in the hospital and discharge states to follow up with PCP. Patient saw Dr. Johnsie Cancel back in 2012. Will forward to Dr. Johnsie Cancel for advisement after he reviews patient's chart.

## 2018-06-13 NOTE — Telephone Encounter (Signed)
F/U Message           Patient's daughter is calling back [phone], waiting on call back

## 2018-06-13 NOTE — Telephone Encounter (Signed)
Tried to call daughter back, no answer and voicemail is full. Will try again later.

## 2018-06-13 NOTE — Telephone Encounter (Signed)

## 2018-06-13 NOTE — Telephone Encounter (Signed)
See me in 8 weeks

## 2018-06-14 ENCOUNTER — Telehealth: Payer: Self-pay | Admitting: Cardiovascular Disease

## 2018-06-14 MED ORDER — AMLODIPINE BESYLATE 10 MG PO TABS: 10.0000 mg | ORAL_TABLET | Freq: Every day | ORAL | 3 refills | Status: DC

## 2018-06-14 NOTE — Telephone Encounter (Signed)
° ° °  Daughter calling with questions on how to track her mothers heart rate. Please call

## 2018-06-14 NOTE — Telephone Encounter (Signed)
Called patient's daughter back with Dr. Kyla Balzarine advisement. Patient will stop Metoprolol and increase norvasc to 10 mg daily. Patient's daughter verbalized understanding.

## 2018-06-14 NOTE — Telephone Encounter (Signed)
Ok to stop metoprolol and just increase norvasc to 10 mg daily

## 2018-06-14 NOTE — Telephone Encounter (Signed)
Patient's daughter is concerned about her mother taking metoprolol and wants to know if there is something she can wear to monitor her pulse. Informed patient's daughter on how to manually check a pulse at the patient's wrist and using a second hand watch. Patient's daughter was not agreeable to this, due to her not being with patient, because she works at the [employer]. Patient's daughter stated her mom has Alzheimers and she has a nurses aid that sits with her during the day. Informed patient's daughter that the nurses aid should be able to check her mom's pulse regularly manually through out the day. Informed her if patient had a BP machine that it usually gives you the heart rate when BP is taken. Patient's daughter stated they do not have a BP machine. Informed patient's daughter they could get on at a drug store. Patient's daughter was wanting to know if we have a device we could give her to keep track of her mother's heart rate. Informed her that we only have event monitors that are worn for at the max 30 days, but it is more for monitoring the heart rhythm. Patient's daughter was not satisfied with any solutions given to her. Will forward to Dr. Johnsie Cancel for advisement.

## 2018-06-17 ENCOUNTER — Emergency Department (HOSPITAL_COMMUNITY)
Admission: EM | Admit: 2018-06-17 | Discharge: 2018-06-17 | Disposition: A | Discharge status: Home / Self Care | Payer: Medicare Other | Attending: Emergency Medicine | Admitting: Emergency Medicine

## 2018-06-17 ENCOUNTER — Other Ambulatory Visit: Payer: Self-pay

## 2018-06-17 ENCOUNTER — Encounter (HOSPITAL_COMMUNITY): Payer: Self-pay | Admitting: Emergency Medicine

## 2018-06-17 DIAGNOSIS — F0390 Unspecified dementia without behavioral disturbance: Secondary | ICD-10-CM | POA: Insufficient documentation

## 2018-06-17 DIAGNOSIS — Y828 Other medical devices associated with adverse incidents: Secondary | ICD-10-CM | POA: Diagnosis not present

## 2018-06-17 DIAGNOSIS — I10 Essential (primary) hypertension: Secondary | ICD-10-CM | POA: Insufficient documentation

## 2018-06-17 DIAGNOSIS — Z79899 Other long term (current) drug therapy: Secondary | ICD-10-CM | POA: Diagnosis not present

## 2018-06-17 DIAGNOSIS — T83098A Other mechanical complication of other indwelling urethral catheter, initial encounter: Secondary | ICD-10-CM | POA: Diagnosis not present

## 2018-06-17 DIAGNOSIS — Z7982 Long term (current) use of aspirin: Secondary | ICD-10-CM | POA: Insufficient documentation

## 2018-06-17 DIAGNOSIS — T839XXA Unspecified complication of genitourinary prosthetic device, implant and graft, initial encounter: Secondary | ICD-10-CM

## 2018-06-17 DIAGNOSIS — E119 Type 2 diabetes mellitus without complications: Secondary | ICD-10-CM | POA: Diagnosis not present

## 2018-06-17 DIAGNOSIS — Z96651 Presence of right artificial knee joint: Secondary | ICD-10-CM | POA: Diagnosis not present

## 2018-06-17 DIAGNOSIS — F419 Anxiety disorder, unspecified: Secondary | ICD-10-CM | POA: Diagnosis not present

## 2018-06-17 DIAGNOSIS — Z7984 Long term (current) use of oral hypoglycemic drugs: Secondary | ICD-10-CM | POA: Insufficient documentation

## 2018-06-17 DIAGNOSIS — F329 Major depressive disorder, single episode, unspecified: Secondary | ICD-10-CM | POA: Insufficient documentation

## 2018-06-17 HISTORY — DX: Unspecified dementia, unspecified severity, without behavioral disturbance, psychotic disturbance, mood disturbance, and anxiety: F03.90

## 2018-06-17 NOTE — ED Provider Notes (Signed)
[facility] EMERGENCY DEPARTMENT Provider Note   CSN: [id number] Arrival date & time: 06/17/18  1238    History   Chief Complaint No chief complaint on file.   HPI [name] is a 78 y.o. female with history of dementia, chronic Foley, is here for Foley dysfunction.  Level 5 caveat due to underlying dementia.  Patient is oriented to self only.  She denies any pain but cannot tell me where she is, states she is here for "some checkup".  I spoke to patient's daughter over the phone.  Reports that on Friday the home RN found patient had leaked urine on the bed sheets, RN thought that may be the balloon and the Foley was not working well.  The last 3 days daughter has found urine in the bed sheets and decreased output through the Foley/bag.  Patient otherwise without fever, changes in the mental status.  Last Foley exchange was on 4/21 when patient came to the ED for irregular heart rate.  Daughter states patient has upcoming follow up with urology. Per EMS they found approx 620 cc of urine in bag but daughter stated that the bag has not been changed since Friday.      HPI  Past Medical History:  Diagnosis Date  . Anxiety   . Dementia ([place])   . Depression   . Diabetes mellitus   . Dyspnea   . GERD (gastroesophageal reflux disease)   . Headache(784.0)   . HTN (hypertension)    dr Johnsie Cancel  . Hypercholesteremia    DENIES  . Osteoarthritis   . PONV (postoperative nausea and vomiting)   . RLS (restless legs syndrome)   . Stress   . UTI (urinary tract infection) 05/27/2014    Patient Active Problem List   Diagnosis Date Noted  . SVT (supraventricular tachycardia) ([place])   . Altered mental status   . Community acquired pneumonia of left upper lobe of lung ([place]) 05/18/2018  . Pressure injury of skin 05/11/2018  . Hypertensive urgency 09/11/2016  . Dental abscess 09/11/2016  . Hypokalemia 09/11/2016  . 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 Acute  post-hemorrhagic anemia 07/09/2016  . Symptomatic anemia 07/09/2016  . 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 Abdominal pain, chronic, epigastric 08/10/2015  . Dehydration   . Leukocytosis   . Aspiration pneumonia ([place]) 07/21/2014  . DM type 2 (diabetes mellitus, type 2) ([place]) 07/21/2014  . Palliative care encounter 07/21/2014  . Dysphagia 07/09/2014  . Decubitus ulcer 07/09/2014  . Blood poisoning   . Acute respiratory failure with hypoxia ([place]) 06/30/2014  . Septic shock ([place]) 06/30/2014  . Lactic acidosis 06/30/2014  . Dementia ([place]) 06/30/2014  . Acute respiratory failure with hypoxemia ([place])   . Hypernatremia   . Acute kidney injury ([place]) 05/28/2014  . Fever 05/27/2014  . Sepsis ([place]) 05/27/2014  . Acute encephalopathy   . FTT (failure to thrive) in adult   . Gait instability 07/18/2013    Class: Acute  . Low back pain 07/18/2013    Class: Acute  . Nausea with vomiting 07/18/2013    Class: Acute  . Abnormality of gait 04/29/2013  . 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 Head trauma 06/03/2010  . HYPERCHOLESTEROLEMIA 05/15/2008  . ANXIETY DEPRESSION 05/15/2008  . DEPRESSION 05/15/2008  . Essential hypertension 05/15/2008  . OSTEOARTHRITIS 05/15/2008  . Edema 05/15/2008  . DYSPNEA ON EXERTION 05/15/2008  . ELECTROCARDIOGRAM, ABNORMAL 05/15/2008    Past  Surgical History:  Procedure Laterality Date  . ABDOMINAL HYSTERECTOMY    . COLONOSCOPY    . JOINT REPLACEMENT    . left forearm fracture with ORIF Left   . LUMBAR LAMINECTOMY/DECOMPRESSION MICRODISCECTOMY N/A 04/18/2012   Procedure: LUMBAR LAMINECTOMY/DECOMPRESSION MICRODISCECTOMY 3 LEVELS;  Surgeon: Winfield Cunas, MD;  Location: [facility];  Service: Neurosurgery;  Laterality: N/A;  Lumbar three-four, lumbar four-five, lumbar five-sacral one decompression. Synovial cyst resection lumbar four-five  . ORIF PERIPROSTHETIC FRACTURE Right  07/06/2016   Procedure: OPEN REDUCTION INTERNAL FIXATION (ORIF) PERIPROSTHETIC FRACTURE;  Surgeon: Gaynelle Arabian, MD;  Location: [facility];  Service: Orthopedics;  Laterality: Right;  . REPLACEMENT TOTAL KNEE Right   . WISDOM TOOTH EXTRACTION       OB History   No obstetric history on file.      Home Medications    Prior to Admission medications   Medication Sig Start Date End Date Taking? Authorizing Provider  acetaminophen (TYLENOL) 500 MG tablet Take 500 mg by mouth every 8 (eight) hours as needed for mild pain.    [provider]  ALPRAZolam Duanne Moron) 0.25 MG tablet Take 1 tablet (0.25 mg total) by mouth at bedtime as needed for anxiety. 05/24/18   Mariel Aloe, MD  amLODipine (NORVASC) 10 MG tablet Take 1 tablet (10 mg total) by mouth daily. 06/14/18   Josue Hector, MD  aspirin 325 MG EC tablet Take 325 mg by mouth daily.    [provider]  divalproex (DEPAKOTE SPRINKLE) 125 MG capsule Take 125 mg by mouth every evening.  02/08/18   [provider]  GLYXAMBI 10-5 MG TABS Take 1 tablet by mouth daily. 03/20/18   [provider]  Infant Care Products (DERMACLOUD) CREA Apply 1 application topically as needed (each change).    [provider]  loratadine-pseudoephedrine (CLARITIN-D 12-HOUR) 5-120 MG tablet Take 1 tablet by mouth 2 (two) times daily as needed for allergies.    [provider]  losartan (COZAAR) 25 MG tablet Take 100 mg by mouth daily.    [provider]  Magnesium Oxide 500 MG TABS Take 500 mg by mouth daily.     [provider]  metFORMIN (GLUCOPHAGE) 500 MG tablet Take 500 mg by mouth 2 (two) times daily with a meal.    [provider]  mirtazapine (REMERON) 15 MG tablet Take 15 mg by mouth at bedtime. 02/27/18   [provider]  omeprazole (PRILOSEC) 40 MG capsule Take 40 mg by mouth daily. 03/09/18   [provider]  ondansetron (ZOFRAN) 4 MG tablet Take 4 mg by mouth every  6 (six) hours as needed for nausea/vomiting. 02/13/18   [provider]  pramipexole (MIRAPEX) 1.5 MG tablet Take 1.5 mg by mouth 3 (three) times daily.    [provider]    Family History Family History  Problem Relation Age of Onset  . Diabetes Father   . High blood pressure Father   . High blood pressure Mother   . Colon cancer Neg Hx   . Esophageal cancer Neg Hx   . Liver disease Neg Hx   . Stomach cancer Neg Hx   . Pancreatic cancer Neg Hx     Social History Social History   Tobacco Use  . Smoking status: Never Smoker  . Smokeless tobacco: Never Used  Substance Use Topics  . Alcohol use: No    Alcohol/week: 0.0 standard drinks  . Drug use: No  Allergies   Flounder [fish allergy]; Morphine and related; Oxycodone; and Sulfonamide derivatives   Review of Systems Review of Systems  Unable to perform ROS: Dementia  Genitourinary: Positive for difficulty urinating.  All other systems reviewed and are negative.    Physical Exam Updated Vital Signs BP 136/78   Pulse 98   Temp 98.6 F (37 C) (Oral)   Resp 19   SpO2 97%   Physical Exam Vitals signs and nursing note reviewed.  Constitutional:      Appearance: She is well-developed.     Comments: Non toxic in NAD  HENT:     Head: Normocephalic and atraumatic.     Nose: Nose normal.  Eyes:     Conjunctiva/sclera: Conjunctivae normal.  Neck:     Musculoskeletal: Normal range of motion.  Cardiovascular:     Rate and Rhythm: Normal rate and regular rhythm.  Pulmonary:     Effort: Pulmonary effort is normal.     Breath sounds: Normal breath sounds.  Abdominal:     General: Bowel sounds are normal.     Palpations: Abdomen is soft.     Tenderness: There is no abdominal tenderness.     Comments: Exam unreliable given underlying dementia, no winging or obvious discomfort with abdominal palpation. Soft. No G/R/R  Genitourinary:    Comments: Foley in place, groin and sheets in bed dry.  approx 70 urine in foley bag slightly cloudy Musculoskeletal: Normal range of motion.  Skin:    General: Skin is warm and dry.     Capillary Refill: Capillary refill takes less than 2 seconds.  Neurological:     Mental Status: She is alert.  Psychiatric:        Behavior: Behavior normal.      ED Treatments / Results  Labs (all labs ordered are listed, but only abnormal results are displayed) Labs Reviewed  URINE CULTURE    EKG None  Radiology No results found.  Procedures BLADDER CATHETERIZATION Date/Time: 06/17/2018 2:12 PM Performed by: Kinnie Feil, PA-C Authorized by: Kinnie Feil, PA-C   Consent:    Consent obtained:  Written (spoke to daughter [place] who gave consent)   Consent given by:  Healthcare agent   Risks discussed:  Infection, urethral injury and incomplete procedure   Alternatives discussed:  Alternative treatment, observation and referral Universal protocol:    Procedure explained and questions answered to patient or proxy's satisfaction: yes     Required blood products, implants, devices and special equipment available: yes     Immediately prior to procedure a time out was called: yes     Patient identity confirmed:  Arm band Pre-procedure details:    Procedure purpose:  Therapeutic Anesthesia (see MAR for exact dosages):    Anesthesia method:  None Procedure details:    Provider performed due to:  Altered anatomy and nurse unavailable   Altered anatomy:  Edema   Catheter insertion:  Indwelling   Catheter type:  Foley   Catheter size:  16 Fr   Bladder irrigation: no     Number of attempts:  1   Urine characteristics:  Mildly cloudy Post-procedure details:    Patient tolerance of procedure:  Tolerated well, no immediate complications   (including critical care time)  Medications Ordered in ED Medications - No data to display   Initial Impression / Assessment and Plan / ED Course  I have reviewed the triage vital signs and  the nursing notes.  Pertinent labs & imaging results  that were available during my care of the patient were reviewed by me and considered in my medical decision making (see chart for details).  Clinical Course as of Jun 16 1413  Sun Jun 17, 2018  1338 Spoke to [place] daughter reports home RN found patient with urine in bed sheets Friday told her balloon was probably not working. Every day since Friday she has been leaking urine in bed, there has been decreased urine output through foley as well since Friday   [CG]    Clinical Course User Index [CG] Kinnie Feil, PA-C      0 cc on bladder scan upon arrival. Pt is dry w/o obvious urine leakage. Afebrile w/o tachycardia. Made 200 cc urine since arrival to ED before foley exchange performed by me, RN EMT at bedside.  Urine culture sent.  Pt deemed appropriate for discharge with f/u with urology.  Return precautions included in AVS. Discussed with EDMD.  Final Clinical Impressions(s) / ED Diagnoses   Final diagnoses:  Foley catheter problem, initial encounter [facility])    ED Discharge Orders    None       Arlean Hopping 06/17/18 1415    Jola Schmidt, MD 06/18/18 2125

## 2018-06-17 NOTE — ED Notes (Signed)
[facility] called for pt transport. 

## 2018-06-17 NOTE — Discharge Instructions (Addendum)
You were seen in the ED for foley malfunction.  This was exchanged today.  You produced urine after exchange.    Urine was sent for culture to rule out occult infection.   Follow up with urology as soon as able to determine need for foley and for how long  Return for fever greater than 100, malodorous bloody or cloudy urine, abdominal pain, changes in behavior or alertness   Call Dr Johnsie Cancel for further discussion of your concern for heart rate monitor

## 2018-06-17 NOTE — ED Notes (Addendum)
Emptied 230 urine from cath drainage bag prior to changing  No urine noted to pad underneath patient.

## 2018-06-17 NOTE — ED Triage Notes (Signed)
Pt PTAR: pt from home with c/o urinary catheter leaking.  Family reported to EMS pt's catheter was draining properly until Friday when they noticed less urine collection in the bag and the pt's bed soaked with urine. [facility] reported emptying 625 of urine from drainage bad before transporting to hospital.    PTAR vitals: BP 152/82 HR 71 Sp02 93% RA CBG 330

## 2018-06-17 NOTE — ED Notes (Addendum)
Pt discharged to home via [place] to pt discharge instructions Pt alert to self and place  Report given to [facility] as well Pt unable to sign

## 2018-06-18 LAB — URINE CULTURE: Culture: NO GROWTH

## 2018-07-06 ENCOUNTER — Other Ambulatory Visit: Payer: Self-pay

## 2018-07-06 ENCOUNTER — Encounter (HOSPITAL_COMMUNITY): Payer: Self-pay

## 2018-07-06 ENCOUNTER — Emergency Department (HOSPITAL_COMMUNITY): Payer: Medicare Other

## 2018-07-06 ENCOUNTER — Inpatient Hospital Stay (HOSPITAL_COMMUNITY)
Admission: EM | Admit: 2018-07-06 | Discharge: 2018-07-11 | DRG: 698 | Disposition: A | Discharge status: Home Health | Payer: Medicare Other | Attending: Internal Medicine | Admitting: Internal Medicine

## 2018-07-06 DIAGNOSIS — Y846 Urinary catheterization as the cause of abnormal reaction of the patient, or of later complication, without mention of misadventure at the time of the procedure: Secondary | ICD-10-CM | POA: Diagnosis present

## 2018-07-06 DIAGNOSIS — Z1159 Encounter for screening for other viral diseases: Secondary | ICD-10-CM | POA: Diagnosis not present

## 2018-07-06 DIAGNOSIS — R4182 Altered mental status, unspecified: Secondary | ICD-10-CM | POA: Diagnosis not present

## 2018-07-06 DIAGNOSIS — F329 Major depressive disorder, single episode, unspecified: Secondary | ICD-10-CM | POA: Diagnosis present

## 2018-07-06 DIAGNOSIS — L8990 Pressure ulcer of unspecified site, unspecified stage: Secondary | ICD-10-CM | POA: Diagnosis present

## 2018-07-06 DIAGNOSIS — E1165 Type 2 diabetes mellitus with hyperglycemia: Secondary | ICD-10-CM

## 2018-07-06 DIAGNOSIS — N179 Acute kidney failure, unspecified: Secondary | ICD-10-CM | POA: Diagnosis present

## 2018-07-06 DIAGNOSIS — Z96651 Presence of right artificial knee joint: Secondary | ICD-10-CM | POA: Diagnosis present

## 2018-07-06 DIAGNOSIS — E876 Hypokalemia: Secondary | ICD-10-CM | POA: Diagnosis present

## 2018-07-06 DIAGNOSIS — T83518A Infection and inflammatory reaction due to other urinary catheter, initial encounter: Secondary | ICD-10-CM | POA: Diagnosis not present

## 2018-07-06 DIAGNOSIS — E860 Dehydration: Secondary | ICD-10-CM | POA: Diagnosis present

## 2018-07-06 DIAGNOSIS — G9341 Metabolic encephalopathy: Secondary | ICD-10-CM | POA: Diagnosis present

## 2018-07-06 DIAGNOSIS — I10 Essential (primary) hypertension: Secondary | ICD-10-CM | POA: Diagnosis present

## 2018-07-06 DIAGNOSIS — L89153 Pressure ulcer of sacral region, stage 3: Secondary | ICD-10-CM | POA: Diagnosis present

## 2018-07-06 DIAGNOSIS — L89159 Pressure ulcer of sacral region, unspecified stage: Secondary | ICD-10-CM

## 2018-07-06 DIAGNOSIS — K219 Gastro-esophageal reflux disease without esophagitis: Secondary | ICD-10-CM | POA: Diagnosis present

## 2018-07-06 DIAGNOSIS — Z90710 Acquired absence of both cervix and uterus: Secondary | ICD-10-CM

## 2018-07-06 DIAGNOSIS — R6520 Severe sepsis without septic shock: Secondary | ICD-10-CM

## 2018-07-06 DIAGNOSIS — E870 Hyperosmolality and hypernatremia: Secondary | ICD-10-CM | POA: Diagnosis present

## 2018-07-06 DIAGNOSIS — Z7984 Long term (current) use of oral hypoglycemic drugs: Secondary | ICD-10-CM | POA: Diagnosis not present

## 2018-07-06 DIAGNOSIS — G2581 Restless legs syndrome: Secondary | ICD-10-CM | POA: Diagnosis present

## 2018-07-06 DIAGNOSIS — R1310 Dysphagia, unspecified: Secondary | ICD-10-CM | POA: Diagnosis not present

## 2018-07-06 DIAGNOSIS — E7800 Pure hypercholesterolemia, unspecified: Secondary | ICD-10-CM | POA: Diagnosis present

## 2018-07-06 DIAGNOSIS — Z79899 Other long term (current) drug therapy: Secondary | ICD-10-CM | POA: Diagnosis not present

## 2018-07-06 DIAGNOSIS — Z87440 Personal history of urinary (tract) infections: Secondary | ICD-10-CM | POA: Diagnosis not present

## 2018-07-06 DIAGNOSIS — Z7401 Bed confinement status: Secondary | ICD-10-CM | POA: Diagnosis not present

## 2018-07-06 DIAGNOSIS — F0280 Dementia in other diseases classified elsewhere without behavioral disturbance: Secondary | ICD-10-CM | POA: Diagnosis not present

## 2018-07-06 DIAGNOSIS — F419 Anxiety disorder, unspecified: Secondary | ICD-10-CM | POA: Diagnosis present

## 2018-07-06 DIAGNOSIS — F0390 Unspecified dementia without behavioral disturbance: Secondary | ICD-10-CM | POA: Diagnosis present

## 2018-07-06 DIAGNOSIS — Z7982 Long term (current) use of aspirin: Secondary | ICD-10-CM | POA: Diagnosis not present

## 2018-07-06 DIAGNOSIS — N390 Urinary tract infection, site not specified: Secondary | ICD-10-CM | POA: Diagnosis present

## 2018-07-06 DIAGNOSIS — Z833 Family history of diabetes mellitus: Secondary | ICD-10-CM | POA: Diagnosis not present

## 2018-07-06 DIAGNOSIS — A419 Sepsis, unspecified organism: Secondary | ICD-10-CM | POA: Diagnosis not present

## 2018-07-06 DIAGNOSIS — T83511A Infection and inflammatory reaction due to indwelling urethral catheter, initial encounter: Secondary | ICD-10-CM

## 2018-07-06 DIAGNOSIS — E119 Type 2 diabetes mellitus without complications: Secondary | ICD-10-CM | POA: Diagnosis present

## 2018-07-06 DIAGNOSIS — Z66 Do not resuscitate: Secondary | ICD-10-CM | POA: Diagnosis present

## 2018-07-06 LAB — COMPREHENSIVE METABOLIC PANEL
ALT: 18 U/L (ref 0–44)
AST: 15 U/L (ref 15–41)
Albumin: 3.4 g/dL — ABNORMAL LOW (ref 3.5–5.0)
Alkaline Phosphatase: 72 U/L (ref 38–126)
Anion gap: 12 (ref 5–15)
BUN: 63 mg/dL — ABNORMAL HIGH (ref 8–23)
CO2: 22 mmol/L (ref 22–32)
Calcium: 9.8 mg/dL (ref 8.9–10.3)
Chloride: 114 mmol/L — ABNORMAL HIGH (ref 98–111)
Creatinine, Ser: 1.34 mg/dL — ABNORMAL HIGH (ref 0.44–1.00)
GFR calc Af Amer: 44 mL/min — ABNORMAL LOW (ref >60)
GFR calc non Af Amer: 38 mL/min — ABNORMAL LOW (ref >60)
Glucose, Bld: 443 mg/dL — ABNORMAL HIGH (ref 70–99)
Potassium: 4 mmol/L (ref 3.5–5.1)
Sodium: 148 mmol/L — ABNORMAL HIGH (ref 135–145)
Total Bilirubin: 0.8 mg/dL (ref 0.3–1.2)
Total Protein: 7.6 g/dL (ref 6.5–8.1)

## 2018-07-06 LAB — CBC WITH DIFFERENTIAL/PLATELET
Abs Immature Granulocytes: 0.03 10*3/uL (ref 0.00–0.07)
Basophils Absolute: 0 10*3/uL (ref 0.0–0.1)
Basophils Relative: 0 %
Eosinophils Absolute: 0 10*3/uL (ref 0.0–0.5)
Eosinophils Relative: 0 %
HCT: 37.8 % (ref 36.0–46.0)
Hemoglobin: 11.4 g/dL — ABNORMAL LOW (ref 12.0–15.0)
Immature Granulocytes: 0 %
Lymphocytes Relative: 13 %
Lymphs Abs: 1.7 10*3/uL (ref 0.7–4.0)
MCH: 25.7 pg — ABNORMAL LOW (ref 26.0–34.0)
MCHC: 30.2 g/dL (ref 30.0–36.0)
MCV: 85.1 fL (ref 80.0–100.0)
Monocytes Absolute: 0.7 10*3/uL (ref 0.1–1.0)
Monocytes Relative: 5 %
Neutro Abs: 11.3 10*3/uL — ABNORMAL HIGH (ref 1.7–7.7)
Neutrophils Relative %: 82 %
Platelets: 348 10*3/uL (ref 150–400)
RBC: 4.44 MIL/uL (ref 3.87–5.11)
RDW: 16.9 % — ABNORMAL HIGH (ref 11.5–15.5)
WBC: 13.7 10*3/uL — ABNORMAL HIGH (ref 4.0–10.5)
nRBC: 0 % (ref 0.0–0.2)

## 2018-07-06 LAB — URINALYSIS, ROUTINE W REFLEX MICROSCOPIC
Bilirubin Urine: NEGATIVE
Glucose, UA: >=500 mg/dL — AB
Ketones, ur: NEGATIVE mg/dL
Nitrite: NEGATIVE
Protein, ur: 100 mg/dL — AB
Specific Gravity, Urine: 1.018 (ref 1.005–1.030)
WBC, UA: >50 WBC/hpf — ABNORMAL HIGH (ref 0–5)
pH: 5 (ref 5.0–8.0)

## 2018-07-06 LAB — SARS CORONAVIRUS 2 BY RT PCR (HOSPITAL ORDER, PERFORMED IN ~~LOC~~ HOSPITAL LAB): SARS Coronavirus 2: NEGATIVE

## 2018-07-06 LAB — LACTIC ACID, PLASMA: Lactic Acid, Venous: 1.8 mmol/L (ref 0.5–1.9)

## 2018-07-06 LAB — CBG MONITORING, ED: Glucose-Capillary: 362 mg/dL — ABNORMAL HIGH (ref 70–99)

## 2018-07-06 LAB — GLUCOSE, CAPILLARY: Glucose-Capillary: 344 mg/dL — ABNORMAL HIGH (ref 70–99)

## 2018-07-06 LAB — TROPONIN I: Troponin I: <0.03 ng/mL (ref <0.03)

## 2018-07-06 MED ORDER — ACETAMINOPHEN 650 MG RE SUPP: 650.0000 mg | Freq: Four times a day (QID) | RECTAL | Status: DC | PRN

## 2018-07-06 MED ORDER — SODIUM CHLORIDE 0.9 % IV SOLN
1.0000 g | Freq: Once | INTRAVENOUS | Status: AC
  Administered 2018-07-06: 1 g via INTRAVENOUS
  Filled 2018-07-06: qty 10

## 2018-07-06 MED ORDER — ACETAMINOPHEN 650 MG RE SUPP
650.0000 mg | Freq: Once | RECTAL | Status: AC
  Administered 2018-07-06: 650 mg via RECTAL
  Filled 2018-07-06: qty 1

## 2018-07-06 MED ORDER — INSULIN ASPART 100 UNIT/ML ~~LOC~~ SOLN
0.0000 [IU] | SUBCUTANEOUS | Status: DC
  Administered 2018-07-06: 7 [IU] via SUBCUTANEOUS

## 2018-07-06 MED ORDER — INSULIN ASPART 100 UNIT/ML ~~LOC~~ SOLN
5.0000 [IU] | Freq: Once | SUBCUTANEOUS | Status: AC
  Administered 2018-07-06: 5 [IU] via SUBCUTANEOUS
  Filled 2018-07-06: qty 1

## 2018-07-06 MED ORDER — SODIUM CHLORIDE 0.9 % IV SOLN
1.0000 g | INTRAVENOUS | Status: DC
  Administered 2018-07-07 – 2018-07-10 (×4): 1 g via INTRAVENOUS
  Filled 2018-07-06: qty 1
  Filled 2018-07-06: qty 10
  Filled 2018-07-06 (×3): qty 1

## 2018-07-06 MED ORDER — ONDANSETRON HCL 4 MG/2ML IJ SOLN: 4.0000 mg | Freq: Four times a day (QID) | INTRAMUSCULAR | Status: DC | PRN

## 2018-07-06 MED ORDER — SODIUM CHLORIDE 0.9 % IV SOLN
INTRAVENOUS | Status: DC
  Administered 2018-07-06 – 2018-07-07 (×2): via INTRAVENOUS

## 2018-07-06 MED ORDER — SODIUM CHLORIDE 0.9 % IV BOLUS
1000.0000 mL | Freq: Once | INTRAVENOUS | Status: AC
  Administered 2018-07-06: 1000 mL via INTRAVENOUS

## 2018-07-06 MED ORDER — ONDANSETRON HCL 4 MG PO TABS: 4.0000 mg | ORAL_TABLET | Freq: Four times a day (QID) | ORAL | Status: DC | PRN

## 2018-07-06 MED ORDER — INSULIN ASPART 100 UNIT/ML ~~LOC~~ SOLN
0.0000 [IU] | SUBCUTANEOUS | Status: DC
  Administered 2018-07-07: 3 [IU] via SUBCUTANEOUS
  Administered 2018-07-07: 5 [IU] via SUBCUTANEOUS
  Administered 2018-07-07: 3 [IU] via SUBCUTANEOUS
  Administered 2018-07-08: 5 [IU] via SUBCUTANEOUS
  Administered 2018-07-08: 05:00:00 3 [IU] via SUBCUTANEOUS
  Administered 2018-07-08: 08:00:00 2 [IU] via SUBCUTANEOUS
  Administered 2018-07-08: 16:00:00 5 [IU] via SUBCUTANEOUS
  Administered 2018-07-08: 2 [IU] via SUBCUTANEOUS

## 2018-07-06 MED ORDER — ENOXAPARIN SODIUM 40 MG/0.4ML ~~LOC~~ SOLN
40.0000 mg | Freq: Every day | SUBCUTANEOUS | Status: DC
  Administered 2018-07-06 – 2018-07-10 (×5): 40 mg via SUBCUTANEOUS
  Filled 2018-07-06 (×5): qty 0.4

## 2018-07-06 MED ORDER — SODIUM CHLORIDE 0.9 % IV SOLN: 1.0000 g | INTRAVENOUS | Status: DC

## 2018-07-06 MED ORDER — ACETAMINOPHEN 325 MG PO TABS: 650.0000 mg | ORAL_TABLET | Freq: Four times a day (QID) | ORAL | Status: DC | PRN

## 2018-07-06 NOTE — ED Notes (Signed)
Bed: XE94 Expected date:  Expected time:  Means of arrival:  Comments: EMS-sepsis

## 2018-07-06 NOTE — ED Notes (Signed)
Transport called 2130

## 2018-07-06 NOTE — ED Triage Notes (Signed)
Per [facility] pt from home for sepsis from UTI and has foley catheter that foul smelling. Pt has dementia, daughter takes care of her at home. Pt has deformed right knee from previous injury from fall that is at her baseline. Hx right hip fracture as well.  Vitals: 136/82, HR 120, R18, 99.1.  22g left hand  Per EMS-Daughter will be calling for updates and to see if she needs to come up here due to patient having dementia.

## 2018-07-06 NOTE — ED Provider Notes (Signed)
[facility] DEPT Provider Note   CSN: [id number] Arrival date & time: 07/06/18  1735    History   Chief Complaint Chief Complaint  Patient presents with  . Recurrent UTI    HPI [name] is a 78 y.o. female hx of DM, HTN, dementia, recurrent UTI with indwelling foley, here presenting with fever, foul-smelling urine.  Patient is currently living at home and daughter noticed that her urine is very cloudy.  She also appears very weak and felt warm. Nurse aide came this afternoon and noticed she had fever 100.7. Patient of note had previous fall with knee and hip fracture prior to last admission.  Patient was also noted to be tachycardic to 120s per EMS.  Patient also had low-grade temperature per EMS of 99.  Denies any sick contacts.  Had recent admission for urinary tract infection. Patient demented and unable to provide any history.      The history is provided by the EMS personnel and a relative.  Level V caveat- dementia   Past Medical History:  Diagnosis Date  . Anxiety   . Dementia ([place])   . Depression   . Diabetes mellitus   . Dyspnea   . GERD (gastroesophageal reflux disease)   . Headache(784.0)   . HTN (hypertension)    dr Johnsie Cancel  . Hypercholesteremia    DENIES  . Osteoarthritis   . PONV (postoperative nausea and vomiting)   . RLS (restless legs syndrome)   . Stress   . UTI (urinary tract infection) 05/27/2014    Patient Active Problem List   Diagnosis Date Noted  . SVT (supraventricular tachycardia) ([place])   . Altered mental status   . Community acquired pneumonia of left upper lobe of lung ([place]) 05/18/2018  . Pressure injury of skin 05/11/2018  . Hypertensive urgency 09/11/2016  . Dental abscess 09/11/2016  . Hypokalemia 09/11/2016  . 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 Acute post-hemorrhagic anemia 07/09/2016  . Symptomatic anemia 07/09/2016  . 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 Abdominal pain, chronic, epigastric 08/10/2015  . Dehydration   . Leukocytosis   . Aspiration pneumonia ([place]) 07/21/2014  . DM type 2 (diabetes mellitus, type 2) ([place]) 07/21/2014  . Palliative care encounter 07/21/2014  . Dysphagia 07/09/2014  . Decubitus ulcer 07/09/2014  . Blood poisoning   . Acute respiratory failure with hypoxia ([place]) 06/30/2014  . Septic shock ([place]) 06/30/2014  . Lactic acidosis 06/30/2014  . Dementia ([place]) 06/30/2014  . Acute respiratory failure with hypoxemia ([place])   . Hypernatremia   . Acute kidney injury ([place]) 05/28/2014  . Fever 05/27/2014  . Sepsis ([place]) 05/27/2014  . Acute encephalopathy   . FTT (failure to thrive) in adult   . Gait instability 07/18/2013    Class: Acute  . Low back pain 07/18/2013    Class: Acute  . Nausea with vomiting 07/18/2013    Class: Acute  . Abnormality of gait 04/29/2013  . 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 Head trauma 06/03/2010  . HYPERCHOLESTEROLEMIA 05/15/2008  . ANXIETY DEPRESSION 05/15/2008  . DEPRESSION 05/15/2008  . Essential hypertension 05/15/2008  . OSTEOARTHRITIS 05/15/2008  . Edema 05/15/2008  . DYSPNEA ON EXERTION 05/15/2008  . ELECTROCARDIOGRAM, ABNORMAL 05/15/2008    Past Surgical History:  Procedure Laterality Date  . ABDOMINAL HYSTERECTOMY    . COLONOSCOPY    . JOINT REPLACEMENT    . left forearm fracture with ORIF Left   .  LUMBAR LAMINECTOMY/DECOMPRESSION MICRODISCECTOMY N/A 04/18/2012   Procedure: LUMBAR LAMINECTOMY/DECOMPRESSION MICRODISCECTOMY 3 LEVELS;  Surgeon: Winfield Cunas, MD;  Location: [facility];  Service: Neurosurgery;  Laterality: N/A;  Lumbar three-four, lumbar four-five, lumbar five-sacral one decompression. Synovial cyst resection lumbar four-five  . ORIF PERIPROSTHETIC FRACTURE Right 07/06/2016   Procedure: OPEN REDUCTION INTERNAL FIXATION (ORIF) PERIPROSTHETIC FRACTURE;  Surgeon: Gaynelle Arabian, MD;  Location: [facility];   Service: Orthopedics;  Laterality: Right;  . REPLACEMENT TOTAL KNEE Right   . WISDOM TOOTH EXTRACTION       OB History   No obstetric history on file.      Home Medications    Prior to Admission medications   Medication Sig Start Date End Date Taking? Authorizing Provider  acetaminophen (TYLENOL) 500 MG tablet Take 500 mg by mouth every 8 (eight) hours as needed for mild pain.   Yes [provider]  ALPRAZolam (XANAX) 0.25 MG tablet Take 1 tablet (0.25 mg total) by mouth at bedtime as needed for anxiety. 05/24/18  Yes Mariel Aloe, MD  amLODipine (NORVASC) 10 MG tablet Take 1 tablet (10 mg total) by mouth daily. 06/14/18  Yes Josue Hector, MD  aspirin 325 MG EC tablet Take 325 mg by mouth daily.   Yes [provider]  loratadine-pseudoephedrine (CLARITIN-D 12-HOUR) 5-120 MG tablet Take 1 tablet by mouth 2 (two) times daily as needed for allergies.   Yes [provider]  losartan (COZAAR) 25 MG tablet Take 100 mg by mouth daily.   Yes [provider]  Magnesium Oxide 500 MG TABS Take 500 mg by mouth daily.    Yes [provider]  metFORMIN (GLUCOPHAGE) 500 MG tablet Take 500-1,000 mg by mouth See admin instructions. Take 1,000mg  by mouth in morning, 500mg  by mouth at lunch, and 500mg  by mouth in evening   Yes [provider]  omeprazole (PRILOSEC) 40 MG capsule Take 40 mg by mouth daily. 03/09/18  Yes [provider]  ondansetron (ZOFRAN) 4 MG tablet Take 4 mg by mouth every 6 (six) hours as needed for nausea/vomiting. 02/13/18  Yes [provider]  pramipexole (MIRAPEX) 1.5 MG tablet Take 1.5 mg by mouth 3 (three) times daily.   Yes [provider]  Infant Care Products (DERMACLOUD) CREA Apply 1 application topically as needed (each change).    [provider]    Family History Family History  Problem Relation Age of Onset  . Diabetes Father   . High blood pressure Father   . High blood  pressure Mother   . Colon cancer Neg Hx   . Esophageal cancer Neg Hx   . Liver disease Neg Hx   . Stomach cancer Neg Hx   . Pancreatic cancer Neg Hx     Social History Social History   Tobacco Use  . Smoking status: Never Smoker  . Smokeless tobacco: Never Used  Substance Use Topics  . Alcohol use: No    Alcohol/week: 0.0 standard drinks  . Drug use: No     Allergies   Flounder [fish allergy]; Morphine and related; Oxycodone; and Sulfonamide derivatives   Review of Systems Review of Systems  Genitourinary: Positive for decreased urine volume and difficulty urinating.  All other systems reviewed and are negative.    Physical Exam Updated Vital Signs BP 133/82   Pulse (!) 106   Temp (!) 102.5 F (39.2 C) (Rectal)   Resp (!) 25   Ht 5\' 7"  (1.702 m)   Wt  85.3 kg   SpO2 98%   BMI 29.44 kg/m   Physical Exam Vitals signs reviewed.  Constitutional:      Comments: Chronically ill   HENT:     Head: Normocephalic.     Nose: Nose normal.     Mouth/Throat:     Mouth: Mucous membranes are dry.     Comments: MM dry  Eyes:     Extraocular Movements: Extraocular movements intact.     Pupils: Pupils are equal, round, and reactive to light.  Neck:     Musculoskeletal: Normal range of motion.  Cardiovascular:     Rate and Rhythm: Normal rate.     Pulses: Normal pulses.     Heart sounds: Normal heart sounds.  Pulmonary:     Effort: Pulmonary effort is normal.  Abdominal:     General: Abdomen is flat.     Palpations: Abdomen is soft.  Musculoskeletal:     Comments: R hip and knee contracted and swollen (recent hip fracture that is present on admission)   Skin:    General: Skin is warm.     Capillary Refill: Capillary refill takes less than 2 seconds.  Neurological:     General: No focal deficit present.     Comments: Demented, contracted   Psychiatric:     Comments: Unable       ED Treatments / Results  Labs (all labs ordered are listed, but only  abnormal results are displayed) Labs Reviewed  CBC WITH DIFFERENTIAL/PLATELET - Abnormal; Notable for the following components:      Result Value   WBC 13.7 (*)    Hemoglobin 11.4 (*)    MCH 25.7 (*)    RDW 16.9 (*)    Neutro Abs 11.3 (*)    All other components within normal limits  COMPREHENSIVE METABOLIC PANEL - Abnormal; Notable for the following components:   Sodium 148 (*)    Chloride 114 (*)    Glucose, Bld 443 (*)    BUN 63 (*)    Creatinine, Ser 1.34 (*)    Albumin 3.4 (*)    GFR calc non Af Amer 38 (*)    GFR calc Af Amer 44 (*)    All other components within normal limits  URINALYSIS, ROUTINE W REFLEX MICROSCOPIC - Abnormal; Notable for the following components:   APPearance TURBID (*)    Glucose, UA >=500 (*)    Hgb urine dipstick MODERATE (*)    Protein, ur 100 (*)    Leukocytes,Ua MODERATE (*)    WBC, UA >50 (*)    Bacteria, UA FEW (*)    All other components within normal limits  CBG MONITORING, ED - Abnormal; Notable for the following components:   Glucose-Capillary 362 (*)    All other components within normal limits  CULTURE, BLOOD (ROUTINE X 2)  CULTURE, BLOOD (ROUTINE X 2)  URINE CULTURE  SARS CORONAVIRUS 2 (HOSPITAL ORDER, [facility])  LACTIC ACID, PLASMA  TROPONIN I    EKG EKG Interpretation  Date/Time:  Friday Jul 06 2018 17:44:47 EDT Ventricular Rate:  130 PR Interval:    QRS Duration: 88 QT Interval:  320 QTC Calculation: 458 R Axis:   -30 Text Interpretation:  Sinus tachycardia with irregular rate Abnormal R-wave progression, early transition LVH with secondary repolarization abnormality Since last tracing rate faster Confirmed by Wandra Arthurs ([id number]) on 07/06/2018 6:06:41 PM   Radiology Dg Chest Port 1 View  Result Date: 07/06/2018 CLINICAL  DATA:  Fever. EXAM: PORTABLE CHEST 1 VIEW COMPARISON:  Radiograph 06/12/2018 FINDINGS: Low lung volumes.The cardiomediastinal contours are unchanged with mild aortic  tortuosity. Pulmonary vasculature is normal. No consolidation, pleural effusion, or pneumothorax. No acute osseous abnormalities are seen. IMPRESSION: Low lung volumes without acute abnormality. Electronically Signed   By: Keith Rake M.D.   On: 07/06/2018 19:51    Procedures Procedures (including critical care time)  CRITICAL CARE Performed by: Wandra Arthurs   Total critical care time: 30 minutes  Critical care time was exclusive of separately billable procedures and treating other patients.  Critical care was necessary to treat or prevent imminent or life-threatening deterioration.  Critical care was time spent personally by me on the following activities: development of treatment plan with patient and/or surrogate as well as nursing, discussions with consultants, evaluation of patient's response to treatment, examination of patient, obtaining history from patient or surrogate, ordering and performing treatments and interventions, ordering and review of laboratory studies, ordering and review of radiographic studies, pulse oximetry and re-evaluation of patient's condition.   Medications Ordered in ED Medications  sodium chloride 0.9 % bolus 1,000 mL (0 mLs Intravenous Stopped 07/06/18 2011)  acetaminophen (TYLENOL) suppository 650 mg (650 mg Rectal Given 07/06/18 1818)  cefTRIAXone (ROCEPHIN) 1 g in sodium chloride 0.9 % 100 mL IVPB (0 g Intravenous Stopped 07/06/18 2011)  insulin aspart (novoLOG) injection 5 Units (5 Units Subcutaneous Given 07/06/18 2008)     Initial Impression / Assessment and Plan / ED Course  I have reviewed the triage vital signs and the nursing notes.  Pertinent labs & imaging results that were available during my care of the patient were reviewed by me and considered in my medical decision making (see chart for details).       [name] is a 78 y.o. female here with fever, dysuria. Has indwelling foley with very cloudy urine. Will change out foley  and will do sepsis workup with CBC, CMP, UA, urine and blood cultures, CXR.   8:18 PM UA showed + leuk and > 50 WBC. Also hypernatremic to 148 and appears dehydrated. Glucose 448 with nl AG. Given SQ insulin and rocephin. Will admit for sepsis, UTI, hypernatremia.    Final Clinical Impressions(s) / ED Diagnoses   Final diagnoses:  None    ED Discharge Orders    None       Drenda Freeze, MD 07/06/18 2019

## 2018-07-06 NOTE — ED Notes (Signed)
Failed unit in pyxis caused delay in administration of rocephin.

## 2018-07-06 NOTE — H&P (Signed)
History and Physical    [name]:[id number] DOB: 01/16/1941 DOA: 07/06/2018  PCP: [name], MD  Patient coming from: Home  I have personally briefly reviewed patient's old medical records in [place]  Chief Complaint: UTI  HPI: [name] is a 78 y.o. female with medical history significant of advanced dementia, DM2, HTN, recurrent UTIs, dysphagia, indwelling foley catheter at baseline.  At baseline patient is bed bound, severe dementia, but usually speaks says a few words to daughter still.  Patient's daughter noticed that urine was cloudy and foul smelling.  Patient also felt warm, and had AMS more so than baseline (not responding to anyone at all).  Patient sent in via EMS.   ED Course: Tachycardic to 25, Tm 102.5, UA is c/w UTI, foley changed out in ED.  Dose of rocephin given.  CXR neg.  Sodium 148, Cl 114, BUN 63, creat of 1.3 (baseline of 16 and 0.7 respectively).   Review of Systems: Unable to perform due to AMS on top of advanced dementia.  Past Medical History:  Diagnosis Date  . Anxiety   . Dementia ([place])   . Depression   . Diabetes mellitus   . Dyspnea   . GERD (gastroesophageal reflux disease)   . Headache(784.0)   . HTN (hypertension)    dr Johnsie Cancel  . Hypercholesteremia    DENIES  . Osteoarthritis   . PONV (postoperative nausea and vomiting)   . RLS (restless legs syndrome)   . Stress   . UTI (urinary tract infection) 05/27/2014    Past Surgical History:  Procedure Laterality Date  . ABDOMINAL HYSTERECTOMY    . COLONOSCOPY    . JOINT REPLACEMENT    . left forearm fracture with ORIF Left   . LUMBAR LAMINECTOMY/DECOMPRESSION MICRODISCECTOMY N/A 04/18/2012   Procedure: LUMBAR LAMINECTOMY/DECOMPRESSION MICRODISCECTOMY 3 LEVELS;  Surgeon: Winfield Cunas, MD;  Location: [facility];  Service: Neurosurgery;  Laterality: N/A;  Lumbar three-four, lumbar four-five, lumbar five-sacral one decompression. Synovial cyst resection  lumbar four-five  . ORIF PERIPROSTHETIC FRACTURE Right 07/06/2016   Procedure: OPEN REDUCTION INTERNAL FIXATION (ORIF) PERIPROSTHETIC FRACTURE;  Surgeon: Gaynelle Arabian, MD;  Location: [facility];  Service: Orthopedics;  Laterality: Right;  . REPLACEMENT TOTAL KNEE Right   . WISDOM TOOTH EXTRACTION       reports that she has never smoked. She has never used smokeless tobacco. She reports that she does not drink alcohol or use drugs.  Allergies  Allergen Reactions  . Flounder [Fish Allergy] Other (See Comments)    Pt breaks out in bumps  . Morphine And Related Other (See Comments)    Reaction:  Hallucinations   . Oxycodone Other (See Comments)    Reaction:  Hallucinations  . Sulfonamide Derivatives Swelling and Other (See Comments)    Reaction:  Unspecified swelling reaction     Family History  Problem Relation Age of Onset  . Diabetes Father   . High blood pressure Father   . High blood pressure Mother   . Colon cancer Neg Hx   . Esophageal cancer Neg Hx   . Liver disease Neg Hx   . Stomach cancer Neg Hx   . Pancreatic cancer Neg Hx      Prior to Admission medications   Medication Sig Start Date End Date Taking? Authorizing Provider  acetaminophen (TYLENOL) 500 MG tablet Take 500 mg by mouth every 8 (eight) hours as needed for mild pain.   Yes [provider]  ALPRAZolam (XANAX) 0.25 MG tablet Take 1 tablet (0.25 mg total) by mouth at bedtime as needed for anxiety. 05/24/18  Yes Mariel Aloe, MD  amLODipine (NORVASC) 10 MG tablet Take 1 tablet (10 mg total) by mouth daily. 06/14/18  Yes Josue Hector, MD  aspirin 325 MG EC tablet Take 325 mg by mouth daily.   Yes [provider]  loratadine-pseudoephedrine (CLARITIN-D 12-HOUR) 5-120 MG tablet Take 1 tablet by mouth 2 (two) times daily as needed for allergies.   Yes [provider]  losartan (COZAAR) 25 MG tablet Take 100 mg by mouth daily.   Yes [provider]  Magnesium Oxide 500 MG TABS  Take 500 mg by mouth daily.    Yes [provider]  metFORMIN (GLUCOPHAGE) 500 MG tablet Take 500-1,000 mg by mouth See admin instructions. Take 1,000mg  by mouth in morning, 500mg  by mouth at lunch, and 500mg  by mouth in evening   Yes [provider]  omeprazole (PRILOSEC) 40 MG capsule Take 40 mg by mouth daily. 03/09/18  Yes [provider]  ondansetron (ZOFRAN) 4 MG tablet Take 4 mg by mouth every 6 (six) hours as needed for nausea/vomiting. 02/13/18  Yes [provider]  pramipexole (MIRAPEX) 1.5 MG tablet Take 1.5 mg by mouth 3 (three) times daily.   Yes [provider]  Infant Care Products (DERMACLOUD) CREA Apply 1 application topically as needed (each change).    [provider]    Physical Exam: Vitals:   07/06/18 1847 07/06/18 1930 07/06/18 2000 07/06/18 2030  BP:  125/81 133/82 (!) 120/92  Pulse:  (!) 113 (!) 106 (!) 114  Resp:  (!) 26 (!) 25 (!) 27  Temp:    100 F (37.8 C)  TempSrc:    Oral  SpO2: 95% 99% 98% 98%  Weight:      Height:        Constitutional: Elderly, doesn't make eye contact, eyes open. Eyes: PERRL, lids and conjunctivae normal ENMT: Mucous membranes are moist. Posterior pharynx clear of any exudate or lesions.Normal dentition.  Neck: normal, supple, no masses, no thyromegaly Respiratory: clear to auscultation bilaterally, no wheezing, no crackles. Normal respiratory effort. No accessory muscle use.  Cardiovascular: Regular rate and rhythm, no murmurs / rubs / gallops. No extremity edema. 2+ pedal pulses. No carotid bruits.  Abdomen: no tenderness, no masses palpated. No hepatosplenomegaly. Bowel sounds positive.  Musculoskeletal:  R hip and knee contracted and swollen (recent hip fracture that is present on admission). Skin: no rashes, lesions, ulcers. No induration Neurologic: Encephalopathic, no eye contact, eyes open Psychiatric: unable   Labs on Admission: I have personally reviewed following  labs and imaging studies  CBC: Recent Labs  Lab 07/06/18 1811  WBC 13.7*  NEUTROABS 11.3*  HGB 11.4*  HCT 37.8  MCV 85.1  PLT 412   Basic Metabolic Panel: Recent Labs  Lab 07/06/18 1811  NA 148*  K 4.0  CL 114*  CO2 22  GLUCOSE 443*  BUN 63*  CREATININE 1.34*  CALCIUM 9.8   GFR: Estimated Creatinine Clearance: 38.2 mL/min (A) (by C-G formula based on SCr of 1.34 mg/dL (H)). Liver Function Tests: Recent Labs  Lab 07/06/18 1811  AST 15  ALT 18  ALKPHOS 72  BILITOT 0.8  PROT 7.6  ALBUMIN 3.4*   No results for input(s): LIPASE, AMYLASE in the last 168 hours. No results for input(s): AMMONIA in the last 168 hours. Coagulation Profile: No results for input(s): INR, PROTIME  in the last 168 hours. Cardiac Enzymes: Recent Labs  Lab 07/06/18 1811  TROPONINI <0.03   BNP (last 3 results) No results for input(s): PROBNP in the last 8760 hours. HbA1C: No results for input(s): HGBA1C in the last 72 hours. CBG: Recent Labs  Lab 07/06/18 1938  GLUCAP 362*   Lipid Profile: No results for input(s): CHOL, HDL, LDLCALC, TRIG, CHOLHDL, LDLDIRECT in the last 72 hours. Thyroid Function Tests: No results for input(s): TSH, T4TOTAL, FREET4, T3FREE, THYROIDAB in the last 72 hours. Anemia Panel: No results for input(s): VITAMINB12, FOLATE, FERRITIN, TIBC, IRON, RETICCTPCT in the last 72 hours. Urine analysis:    Component Value Date/Time   COLORURINE YELLOW 07/06/2018 1943   APPEARANCEUR TURBID (A) 07/06/2018 1943   LABSPEC 1.018 07/06/2018 1943   PHURINE 5.0 07/06/2018 1943   GLUCOSEU >=500 (A) 07/06/2018 1943   HGBUR MODERATE (A) 07/06/2018 [place] 07/06/2018 1943   KETONESUR NEGATIVE 07/06/2018 1943   PROTEINUR 100 (A) 07/06/2018 1943   UROBILINOGEN 0.2 07/20/2014 2228   NITRITE NEGATIVE 07/06/2018 1943   LEUKOCYTESUR MODERATE (A) 07/06/2018 1943    Radiological Exams on Admission: Dg Chest Port 1 View  Result Date: 07/06/2018 CLINICAL  DATA:  Fever. EXAM: PORTABLE CHEST 1 VIEW COMPARISON:  Radiograph 06/12/2018 FINDINGS: Low lung volumes.The cardiomediastinal contours are unchanged with mild aortic tortuosity. Pulmonary vasculature is normal. No consolidation, pleural effusion, or pneumothorax. No acute osseous abnormalities are seen. IMPRESSION: Low lung volumes without acute abnormality. Electronically Signed   By: Keith Rake M.D.   On: 07/06/2018 19:51    EKG: Independently reviewed.  Assessment/Plan Principal Problem:   Urinary tract infection associated with catheterization of urinary tract, initial encounter [facility]) Active Problems:   Essential hypertension   Sepsis ([place])   Acute kidney injury ([place])   Dementia ([place])   Dysphagia   DM type 2 (diabetes mellitus, type 2) ([facility])   Pressure injury of skin   Altered mental status    1. Sepsis with AKI secondary to CAUTI - 1. Rocephin 2. Cultures pending 3. IVF: 1L bolus in ED then 125 cc/hr NS 4. Repeat CBC/BMP in AM 5. Strict intake and output 6. Tylenol PRN fever 7. AKI probably pre-renal given the hypernatremia, hyperchloremeia dehydration appearance, as well as the BUN to creat ratio 2. HTN - 1. Holding home BP meds, patient currently NPO 2. Holding Losartan due to AKI as well 3. DM2 - 1. Sensitive SSI Q4H 4. Acute encephalopathy on top of advanced dementia - 1. Due to #1 above 5. Dysphagia - 1. NPO for the moment due to non-verbal AMS 2. At baseline per daughter, patient on nectar thick liquids 6. Sacral decubiti ulcers - 1. Wound care consult  DVT prophylaxis: Lovenox Code Status: DNR/DNI confirmed with daughter Family Communication: Spoke with daughter who is HPOA over phone Disposition Plan: Home after admit Consults called: None Admission status: Admit to inpatient  Severity of Illness: The appropriate patient status for this patient is INPATIENT. Inpatient status is judged to be reasonable and necessary in order to provide the required  intensity of service to ensure the patient's safety. The patient's presenting symptoms, physical exam findings, and initial radiographic and laboratory data in the context of their chronic comorbidities is felt to place them at high risk for further clinical deterioration. Furthermore, it is not anticipated that the patient will be medically stable for discharge from the hospital within 2 midnights of admission. The following factors support the patient status of inpatient.   "  The patient's presenting symptoms include AMS, fever, foul smelling urine. " The worrisome physical exam findings include AMS, fever, tachycardia. " The initial radiographic and laboratory data are worrisome because of UTI, AKI, dehydration. " The chronic co-morbidities include Advanced dementia, bed bound at baseline.   * I certify that at the point of admission it is my clinical judgment that the patient will require inpatient hospital care spanning beyond 2 midnights from the point of admission due to high intensity of service, high risk for further deterioration and high frequency of surveillance required.*    GARDNER, JARED M. DO [facility]  How to contact the [facility] Attending or Consulting provider [place] or covering provider during after hours [place], for this patient?  1. Check the care team in [facility] and look for a) attending/consulting [facility] provider listed and b) the [facility] team listed 2. Log into [email or web address]  Amion Physician Scheduling and messaging for groups and whole hospitals  On call and physician scheduling software for group practices, residents, hospitalists and other medical providers for call, clinic, rotation and shift schedules. On[employer] is a hospital-wide system for scheduling doctors and paging doctors on call. EasyPlot is for scientific plotting and data analysis.  [email or web address]  and use [place]'s universal password to access. If you do not have the password, please contact the  hospital operator.  3. Locate the [facility] provider you are looking for under [facility] and page to a number that you can be directly reached. 4. If you still have difficulty reaching the provider, please page the [facility] (Director on Call) for the Hospitalists listed on amion for assistance.  07/06/2018, 8:52 PM

## 2018-07-06 NOTE — ED Notes (Signed)
ED TO INPATIENT HANDOFF REPORT  Name/Age/Gender [name] 78 y.o. female  Code Status    Code Status Orders  (From admission, onward)         Start     Ordered   07/06/18 2052  Do not attempt resuscitation (DNR)  Continuous    Question Answer Comment  In the event of cardiac or respiratory ARREST Do not call a "code blue"   In the event of cardiac or respiratory ARREST Do not perform Intubation, CPR, defibrillation or ACLS   In the event of cardiac or respiratory ARREST Use medication by any route, position, wound care, and other measures to relive pain and suffering. May use oxygen, suction and manual treatment of airway obstruction as needed for comfort.   Comments Confirmed with daughter      07/06/18 2052        Code Status History    Date Active Date Inactive Code Status Order ID Comments User Context   05/19/2018 0051 05/24/2018 2212 Full Code [id number]  Elwyn Reach, MD Inpatient   05/10/2018 1942 05/14/2018 1815 Full Code [id number]  Patrecia Pour, MD Inpatient   03/07/2018 1639 03/09/2018 1355 Full Code [id number]  Derl Barrow, [place] Inpatient   09/11/2016 0041 09/12/2016 1756 DNR [id number]  Vianne Bulls, MD ED   08/11/2016 0240 08/13/2016 2126 DNR [id number]  Edwin Dada, MD ED   07/10/2016 1329 07/10/2016 1821 DNR [id number]  Florencia Reasons, MD Inpatient   07/09/2016 1726 07/10/2016 1329 Full Code [id number]  Debbe Odea, MD ED   07/07/2016 0725 07/08/2016 1604 DNR [id number]  Gaynelle Arabian, MD Inpatient   07/04/2016 2354 07/07/2016 0725 Full Code [id number]  Ardeen Jourdain, PA-C ED   07/21/2014 0118 07/22/2014 1832 DNR [id number]  Bonnielee Haff, MD Inpatient   07/10/2014 1532 07/14/2014 2050 DNR [id number]  Jonetta Osgood, MD Inpatient   07/09/2014 2158 07/10/2014 1532 Full Code [id number]  Toy Baker, MD Inpatient   06/30/2014 0545 07/04/2014 1908 Full Code [id number]  Brand Males, MD Inpatient   05/27/2014 1605 05/31/2014 1800 DNR [id number]  Kelvin Cellar, MD ED   07/26/2013 1320 07/27/2013 1425 Full Code [id number]  Luanne Bras, MD Inpatient   07/18/2013 1728 07/26/2013 1320 Full Code [id number]  [name], MD Inpatient      Home/SNF/Other Home  Chief Complaint sepsis  Level of Care/Admitting Diagnosis ED Disposition    ED Disposition Condition [facility] Area: [facility] [[id number]]  Level of Care: Med-Surg [16]  Covid Evaluation: Screening Protocol (No Symptoms)  Diagnosis: Urinary tract infection associated with catheterization of urinary tract, initial encounter [facility]) [[id number]]  Admitting Physician: Doreatha Massed  Attending Physician: Etta Quill [id number]  Estimated length of stay: past midnight tomorrow  Certification:: I certify this patient will need inpatient services for at least 2 midnights  PT Class (Do Not Modify): Inpatient [101]  PT Acc Code (Do Not Modify): Private [1]       Medical History Past Medical History:  Diagnosis Date  . Anxiety   . Dementia ([place])   . Depression   . Diabetes mellitus   . Dyspnea   . GERD (gastroesophageal reflux disease)   . Headache(784.0)   . HTN (hypertension)    dr Johnsie Cancel  . Hypercholesteremia    DENIES  . Osteoarthritis   . PONV (postoperative nausea and vomiting)   . RLS (restless legs syndrome)   . Stress   . UTI (urinary  tract infection) 05/27/2014    Allergies Allergies  Allergen Reactions  . Flounder [Fish Allergy] Other (See Comments)    Pt breaks out in bumps  . Morphine And Related Other (See Comments)    Reaction:  Hallucinations   . Oxycodone Other (See Comments)    Reaction:  Hallucinations  . Sulfonamide Derivatives Swelling and Other (See Comments)    Reaction:  Unspecified swelling reaction     IV Location/Drains/Wounds Patient Lines/Drains/Airways Status   Active Line/Drains/Airways    Name:   Placement date:   Placement time:   Site:   Days:   Peripheral IV 07/06/18 Left Hand    07/06/18    1736    Hand   less than 1   Urethral Catheter Carmon Sails, PA Latex 16 Fr.   06/17/18    1407    Latex   19   Urethral Catheter Double-lumen 16 Fr.   07/06/18    1838    Double-lumen   less than 1   Pressure Injury 05/10/18 Stage III -  Full thickness tissue loss. Subcutaneous fat may be visible but bone, tendon or muscle are NOT exposed.   05/10/18    1900     57          Labs/Imaging Results for orders placed or performed during the hospital encounter of 07/06/18 (from the past 48 hour(s))  CBC with Differential/Platelet     Status: Abnormal   Collection Time: 07/06/18  6:11 PM  Result Value Ref Range   WBC 13.7 (H) 4.0 - 10.5 K/uL   RBC 4.44 3.87 - 5.11 MIL/uL   Hemoglobin 11.4 (L) 12.0 - 15.0 g/dL   HCT 37.8 36.0 - 46.0 %   MCV 85.1 80.0 - 100.0 fL   MCH 25.7 (L) 26.0 - 34.0 pg   MCHC 30.2 30.0 - 36.0 g/dL   RDW 16.9 (H) 11.5 - 15.5 %   Platelets 348 150 - 400 K/uL   nRBC 0.0 0.0 - 0.2 %   Neutrophils Relative % 82 %   Neutro Abs 11.3 (H) 1.7 - 7.7 K/uL   Lymphocytes Relative 13 %   Lymphs Abs 1.7 0.7 - 4.0 K/uL   Monocytes Relative 5 %   Monocytes Absolute 0.7 0.1 - 1.0 K/uL   Eosinophils Relative 0 %   Eosinophils Absolute 0.0 0.0 - 0.5 K/uL   Basophils Relative 0 %   Basophils Absolute 0.0 0.0 - 0.1 K/uL   Immature Granulocytes 0 %   Abs Immature Granulocytes 0.03 0.00 - 0.07 K/uL    Comment: Performed at [facility], [place] [address], [place], [place] [address]  Comprehensive metabolic panel     Status: Abnormal   Collection Time: 07/06/18  6:11 PM  Result Value Ref Range   Sodium 148 (H) 135 - 145 mmol/L   Potassium 4.0 3.5 - 5.1 mmol/L   Chloride 114 (H) 98 - 111 mmol/L   CO2 22 22 - 32 mmol/L   Glucose, Bld 443 (H) 70 - 99 mg/dL   BUN 63 (H) 8 - 23 mg/dL   Creatinine, Ser 1.34 (H) 0.44 - 1.00 mg/dL   Calcium 9.8 8.9 - 10.3 mg/dL   Total Protein 7.6 6.5 - 8.1 g/dL   Albumin 3.4 (L) 3.5 - 5.0 g/dL   AST 15 15 - 41 U/L    ALT 18 0 - 44 U/L   Alkaline Phosphatase 72 38 - 126 U/L   Total Bilirubin 0.8 0.3 -  1.2 mg/dL   GFR calc non Af Amer 38 (L) >60 mL/min   GFR calc Af Amer 44 (L) >60 mL/min   Anion gap 12 5 - 15    Comment: Performed at [facility], [place] [address] [place], [place] [address]  Lactic acid, plasma     Status: None   Collection Time: 07/06/18  6:11 PM  Result Value Ref Range   Lactic Acid, Venous 1.8 0.5 - 1.9 mmol/L    Comment: Performed at [facility], [place] [address], [place], [place] [address]  Troponin I - ONCE - STAT     Status: None   Collection Time: 07/06/18  6:11 PM  Result Value Ref Range   Troponin I <0.03 <0.03 ng/mL    Comment: Performed at [facility], [place] [address], [place], [place] [address]  CBG monitoring, ED     Status: Abnormal   Collection Time: 07/06/18  7:38 PM  Result Value Ref Range   Glucose-Capillary 362 (H) 70 - 99 mg/dL  Urinalysis, Routine w reflex microscopic     Status: Abnormal   Collection Time: 07/06/18  7:43 PM  Result Value Ref Range   Color, Urine YELLOW YELLOW   APPearance TURBID (A) CLEAR   Specific Gravity, Urine 1.018 1.005 - 1.030   pH 5.0 5.0 - 8.0   Glucose, UA >=500 (A) NEGATIVE mg/dL   Hgb urine dipstick MODERATE (A) NEGATIVE   Bilirubin Urine NEGATIVE NEGATIVE   Ketones, ur NEGATIVE NEGATIVE mg/dL   Protein, ur 100 (A) NEGATIVE mg/dL   Nitrite NEGATIVE NEGATIVE   Leukocytes,Ua MODERATE (A) NEGATIVE   RBC / HPF 11-20 0 - 5 RBC/hpf   WBC, UA >50 (H) 0 - 5 WBC/hpf   Bacteria, UA FEW (A) NONE SEEN   Squamous Epithelial / LPF 11-20 0 - 5   WBC Clumps PRESENT    Mucus PRESENT    Amorphous [place] PRESENT     Comment: Performed at [facility], [place] [address], [place], [place] [address]  SARS Coronavirus 2 (CEPHEID - Performed in [place] hospital lab), Hosp Order     Status: None   Collection Time: 07/06/18  8:11 PM  Result Value Ref Range   SARS  Coronavirus 2 NEGATIVE NEGATIVE    Comment: (NOTE) If result is NEGATIVE SARS-CoV-2 target nucleic acids are NOT DETECTED. The SARS-CoV-2 RNA is generally detectable in upper and lower  respiratory specimens during the acute phase of infection. The lowest  concentration of SARS-CoV-2 viral copies this assay can detect is 250  copies / mL. A negative result does not preclude SARS-CoV-2 infection  and should not be used as the sole basis for treatment or other  patient management decisions.  A negative result may occur with  improper specimen collection / handling, submission of specimen other  than nasopharyngeal swab, presence of viral mutation(s) within the  areas targeted by this assay, and inadequate number of viral copies  (<250 copies / mL). A negative result must be combined with clinical  observations, patient history, and epidemiological information. If result is POSITIVE SARS-CoV-2 target nucleic acids are DETECTED. The SARS-CoV-2 RNA is generally detectable in upper and lower  respiratory specimens dur ing the acute phase of infection.  Positive  results are indicative of active infection with SARS-CoV-2.  Clinical  correlation with patient history and other diagnostic information is  necessary to determine patient infection status.  Positive results do  not rule out  bacterial infection or co-infection with other viruses. If result is PRESUMPTIVE POSTIVE SARS-CoV-2 nucleic acids MAY BE PRESENT.   A presumptive positive result was obtained on the submitted specimen  and confirmed on repeat testing.  While 2019 novel coronavirus  (SARS-CoV-2) nucleic acids may be present in the submitted sample  additional confirmatory testing may be necessary for epidemiological  and / or clinical management purposes  to differentiate between  SARS-CoV-2 and other Sarbecovirus currently known to infect humans.  If clinically indicated additional testing with an alternate test  methodology  [phone]) is advised. The SARS-CoV-2 RNA is generally  detectable in upper and lower respiratory sp ecimens during the acute  phase of infection. The expected result is Negative. Fact Sheet for Patients:  StrictlyIdeas.no Fact Sheet for Healthcare Providers: BankingDealers.co.za This test is not yet approved or cleared by the [place] FDA and has been authorized for detection and/or diagnosis of SARS-CoV-2 by FDA under an Emergency Use Authorization (EUA).  This EUA will remain in effect (meaning this test can be used) for the duration of the COVID-19 declaration under Section 564(b)(1) of the Act, 21 U.S.C. section 360bbb-3(b)(1), unless the authorization is terminated or revoked sooner. Performed at [facility], [place] [address] [place], [place] [address]    Dg Chest Port 1 View  Result Date: 07/06/2018 CLINICAL DATA:  Fever. EXAM: PORTABLE CHEST 1 VIEW COMPARISON:  Radiograph 06/12/2018 FINDINGS: Low lung volumes.The cardiomediastinal contours are unchanged with mild aortic tortuosity. Pulmonary vasculature is normal. No consolidation, pleural effusion, or pneumothorax. No acute osseous abnormalities are seen. IMPRESSION: Low lung volumes without acute abnormality. Electronically Signed   By: Keith Rake M.D.   On: 07/06/2018 19:51    Pending Labs Unresulted Labs (From admission, onward)    Start     Ordered   07/07/18 0500  CBC  Tomorrow morning,   R     07/06/18 2052   07/07/18 4496  Basic metabolic panel  Tomorrow morning,   R     07/06/18 2052   07/06/18 1749  Blood culture (routine x 2)  BLOOD CULTURE X 2,   STAT     07/06/18 1748   07/06/18 1749  Urine culture  ONCE - STAT,   STAT     07/06/18 1748          Vitals/Pain Today's Vitals   07/06/18 1848 07/06/18 1930 07/06/18 2000 07/06/18 2030  BP:  125/81 133/82 (!) 120/92  Pulse:  (!) 113 (!) 106 (!) 114  Resp:  (!) 26 (!) 25 (!) 27  Temp:     100 F (37.8 C)  TempSrc:    Oral  SpO2:  99% 98% 98%  Weight:      Height:      PainSc: 6        Isolation Precautions No active isolations  Medications Medications  0.9 %  sodium chloride infusion (has no administration in time range)  cefTRIAXone (ROCEPHIN) 1 g in sodium chloride 0.9 % 100 mL IVPB (has no administration in time range)  acetaminophen (TYLENOL) tablet 650 mg (has no administration in time range)    Or  acetaminophen (TYLENOL) suppository 650 mg (has no administration in time range)  ondansetron (ZOFRAN) tablet 4 mg (has no administration in time range)    Or  ondansetron (ZOFRAN) injection 4 mg (has no administration in time range)  enoxaparin (LOVENOX) injection 40 mg (has no administration in time range)  insulin aspart (novoLOG) injection 0-9 Units (has  no administration in time range)  sodium chloride 0.9 % bolus 1,000 mL (0 mLs Intravenous Stopped 07/06/18 2011)  acetaminophen (TYLENOL) suppository 650 mg (650 mg Rectal Given 07/06/18 1818)  cefTRIAXone (ROCEPHIN) 1 g in sodium chloride 0.9 % 100 mL IVPB (0 g Intravenous Stopped 07/06/18 2011)  insulin aspart (novoLOG) injection 5 Units (5 Units Subcutaneous Given 07/06/18 2008)    Mobility non-ambulatory

## 2018-07-07 ENCOUNTER — Other Ambulatory Visit: Payer: Self-pay

## 2018-07-07 DIAGNOSIS — G9341 Metabolic encephalopathy: Secondary | ICD-10-CM

## 2018-07-07 DIAGNOSIS — F0390 Unspecified dementia without behavioral disturbance: Secondary | ICD-10-CM

## 2018-07-07 LAB — GLUCOSE, CAPILLARY
Glucose-Capillary: 105 mg/dL — ABNORMAL HIGH (ref 70–99)
Glucose-Capillary: 181 mg/dL — ABNORMAL HIGH (ref 70–99)
Glucose-Capillary: 205 mg/dL — ABNORMAL HIGH (ref 70–99)
Glucose-Capillary: 247 mg/dL — ABNORMAL HIGH (ref 70–99)
Glucose-Capillary: 274 mg/dL — ABNORMAL HIGH (ref 70–99)
Glucose-Capillary: 94 mg/dL (ref 70–99)

## 2018-07-07 LAB — BASIC METABOLIC PANEL
Anion gap: 8 (ref 5–15)
Anion gap: 9 (ref 5–15)
BUN: 65 mg/dL — ABNORMAL HIGH (ref 8–23)
BUN: 61 mg/dL — ABNORMAL HIGH (ref 8–23)
CO2: 24 mmol/L (ref 22–32)
CO2: 20 mmol/L — ABNORMAL LOW (ref 22–32)
Calcium: 9.8 mg/dL (ref 8.9–10.3)
Calcium: 9.4 mg/dL (ref 8.9–10.3)
Chloride: 122 mmol/L — ABNORMAL HIGH (ref 98–111)
Chloride: 124 mmol/L — ABNORMAL HIGH (ref 98–111)
Creatinine, Ser: 1.21 mg/dL — ABNORMAL HIGH (ref 0.44–1.00)
Creatinine, Ser: 1.23 mg/dL — ABNORMAL HIGH (ref 0.44–1.00)
GFR calc Af Amer: 49 mL/min — ABNORMAL LOW (ref >60)
GFR calc Af Amer: 48 mL/min — ABNORMAL LOW (ref >60)
GFR calc non Af Amer: 43 mL/min — ABNORMAL LOW (ref >60)
GFR calc non Af Amer: 42 mL/min — ABNORMAL LOW (ref >60)
Glucose, Bld: 110 mg/dL — ABNORMAL HIGH (ref 70–99)
Glucose, Bld: 244 mg/dL — ABNORMAL HIGH (ref 70–99)
Potassium: 3.8 mmol/L (ref 3.5–5.1)
Potassium: 4 mmol/L (ref 3.5–5.1)
Sodium: 154 mmol/L — ABNORMAL HIGH (ref 135–145)
Sodium: 153 mmol/L — ABNORMAL HIGH (ref 135–145)

## 2018-07-07 LAB — CBC
HCT: 36.2 % (ref 36.0–46.0)
Hemoglobin: 10.5 g/dL — ABNORMAL LOW (ref 12.0–15.0)
MCH: 25.6 pg — ABNORMAL LOW (ref 26.0–34.0)
MCHC: 29 g/dL — ABNORMAL LOW (ref 30.0–36.0)
MCV: 88.3 fL (ref 80.0–100.0)
Platelets: 288 10*3/uL (ref 150–400)
RBC: 4.1 MIL/uL (ref 3.87–5.11)
RDW: 16.7 % — ABNORMAL HIGH (ref 11.5–15.5)
WBC: 11.9 10*3/uL — ABNORMAL HIGH (ref 4.0–10.5)
nRBC: 0 % (ref 0.0–0.2)

## 2018-07-07 MED ORDER — DEXTROSE-NACL 5-0.45 % IV SOLN
INTRAVENOUS | Status: DC
  Administered 2018-07-07 – 2018-07-08 (×2): via INTRAVENOUS

## 2018-07-07 NOTE — TOC Initial Note (Signed)
Transition of Care [facility]) - Initial/Assessment Note    Patient Details  Name: [name] MRN: [id number] Date of Birth: 1940-07-29  Transition of Care [facility]) CM/SW Contact:    Nila Nephew, LCSW Phone Number:  [phone] weekend coverage 07/07/2018, 9:27 AM  Clinical Narrative:  Pt admitted with UTI/sepsis from home where she resides with her daughter. Pt has dementia and at baseline is minimally oriented and interactive per daughter, does respond to daughter/caregiverss prompts typically. Is immobile/bedbound.  Pt has foley catheter and daughter asking what can be done to prevent UTIs assosicated with catheter. Also voicing concern that "mom keeps getting dehydrated" and asking for advice/plan to hydrate pt at home better. CSW directed her to medical team. Pt has private care aides with [facility] 12 hours/day and daughter cares for her otherwise. Daughter reports planning to retire soon to care for pt full-time.  Daughter open to care planning as pt's hospitalization progresses, no current requests/needs for [facility] team.                  Expected Discharge Plan: [place] Barriers to Discharge: Continued Medical Work up   Patient Goals and CMS Choice Patient states their goals for this hospitalization and ongoing recovery are:: pt cannot state- daughter states, "know if there is something we can do better to prevent UTIs and a plan to keep her better hydrated" CMS Medicare.gov Compare Post Acute Care list provided to:: Other (Comment Required)(NA no services being arranged currently)    Expected Discharge Plan and Services Expected Discharge Plan: [place] In-house Referral: Clinical Social Work   Post Acute Care Choice: [place] arrangements for the past 2 months: [place]                 DME Arranged: (has hoyer lift and air mattress at home arranged at previous hospitalization) DME Agency: [employer]                   Prior Living Arrangements/Services Living arrangements for the past 2 months: [place] Lives with:: Adult Children Patient language and need for interpreter reviewed:: No Do you feel safe going back to the place where you live?: Yes      Need for Family Participation in Patient Care: Yes (Comment)(daughter) Care giver support system in place?: Yes (comment)(daughter, private care aides) Current home services: Homehealth aide, DME Criminal Activity/Legal Involvement Pertinent to Current Situation/Hospitalization: No - Comment as needed  Activities of Daily Living Home Assistive Devices/Equipment: Environmental consultant (specify type) ADL Screening (condition at time of admission) Patient's cognitive ability adequate to safely complete daily activities?: No Is the patient deaf or have difficulty hearing?: Yes Does the patient have difficulty seeing, even when wearing glasses/contacts?: Yes Does the patient have difficulty concentrating, remembering, or making decisions?: Yes Patient able to express need for assistance with ADLs?: Yes Does the patient have difficulty dressing or bathing?: Yes Independently performs ADLs?: No Does the patient have difficulty walking or climbing stairs?: Yes Weakness of Legs: Both Weakness of Arms/Hands: Both  Permission Sought/Granted Permission sought to share information with : Family Supports Permission granted to share information with : No(pt not interactive- daughter is [facility] and primary caregiver)  Share Information with NAME: Dollye Glasser [phone]           Emotional Assessment              Admission diagnosis:  Lower urinary tract infectious disease [  N39.0] Hypernatremia [E87.0] Patient Active Problem List   Diagnosis Date Noted  . Urinary tract infection associated with catheterization of urinary tract, initial encounter ([place]) 07/06/2018  . SVT (supraventricular tachycardia) ([place])   . Altered mental status   .  Community acquired pneumonia of left upper lobe of lung ([place]) 05/18/2018  . Pressure injury of skin 05/11/2018  . Hypertensive urgency 09/11/2016  . Dental abscess 09/11/2016  . Hypokalemia 09/11/2016  . 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 Acute post-hemorrhagic anemia 07/09/2016  . Symptomatic anemia 07/09/2016  . 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 Abdominal pain, chronic, epigastric 08/10/2015  . Dehydration   . Leukocytosis   . Aspiration pneumonia ([place]) 07/21/2014  . DM type 2 (diabetes mellitus, type 2) ([place]) 07/21/2014  . Palliative care encounter 07/21/2014  . Dysphagia 07/09/2014  . Decubitus ulcer 07/09/2014  . Blood poisoning   . Acute respiratory failure with hypoxia ([place]) 06/30/2014  . Septic shock ([place]) 06/30/2014  . Lactic acidosis 06/30/2014  . Dementia ([place]) 06/30/2014  . Acute respiratory failure with hypoxemia ([place])   . Hypernatremia   . Acute kidney injury ([place]) 05/28/2014  . Fever 05/27/2014  . Sepsis ([place]) 05/27/2014  . Acute encephalopathy   . FTT (failure to thrive) in adult   . Gait instability 07/18/2013    Class: Acute  . Low back pain 07/18/2013    Class: Acute  . Nausea with vomiting 07/18/2013    Class: Acute  . Abnormality of gait 04/29/2013  . 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 Head trauma 06/03/2010  . HYPERCHOLESTEROLEMIA 05/15/2008  . ANXIETY DEPRESSION 05/15/2008  . DEPRESSION 05/15/2008  . Essential hypertension 05/15/2008  . OSTEOARTHRITIS 05/15/2008  . Edema 05/15/2008  . DYSPNEA ON EXERTION 05/15/2008  . ELECTROCARDIOGRAM, ABNORMAL 05/15/2008   PCP:  [name], MD Pharmacy:   [place], [place] [address] Phone: [phone] Fax: [phone]     Social Determinants of Health (SDOH) Interventions    Readmission Risk Interventions Readmission  Risk Prevention Plan 07/07/2018  Transportation Screening Complete  PCP or Specialist Appt within 3-5 Days Not Complete  Not Complete comments unknown DC date however pt is established with PCP  Social Work Consult for [place] Planning/Counseling Complete  Some recent data might be hidden

## 2018-07-07 NOTE — Progress Notes (Signed)
New Admission Note:    Arrival Method: Bed Mental Orientation:  Disoriented X 4, patient non-verbal Telemetry: none Assessment: complete Skin: Dry, MSAD,  Stage III sacrum , dressing placed Iv: L Hand Pain: none Safety Measures: Safety Fall Prevention Plan has been given, discussed and signed Admission: Completed [facility] Orientation: Patient has been orientated to the room, unit, and staff.  Family: daughter, POA  Orders have been reviewed and implemented. Will continue to monitor the patient. Call light has been placed within reach and bed alarm has been activated.   Tawni Carnes, RN Phone number: [phone]

## 2018-07-07 NOTE — Progress Notes (Signed)
PROGRESS NOTE    [name]  IHK:[id number] DOB: 1940/03/18 DOA: 07/06/2018 PCP: [name], MD   Brief Narrative: [name] is a 78 y.o. female with medical history significant of advanced dementia, DM2, HTN, recurrent UTIs, dysphagia, indwelling foley catheter at baseline. She presented secondary to worsening confusion and foul smelling urine. She is being treated for a CAUTI and worked up for sepsis.   Assessment & Plan:   Principal Problem:   Urinary tract infection associated with catheterization of urinary tract, initial encounter [facility]) Active Problems:   Essential hypertension   Sepsis ([place])   Acute kidney injury ([place])   Dementia ([place])   Dysphagia   DM type 2 (diabetes mellitus, type 2) (HCC)   Pressure injury of skin   Altered mental status   Severe sepsis Present on admission. Secondary to CAUTI. Started on Ceftriaxone on admission. -Management below  CAUTI Foley is chronic. Started on ceftriaxone. Urine cultures obtained. Blood cultures obtained. -Continue Ceftriaxone -Follow-up urine/blood cultures  Acute metabolic encephalopathy Likely secondary to infection on top of underlying infection in addition to losartan use as an outpatient -Treat infection as mentioned above  Acute kidney injury Baseline creatinine of 0.7. Creatinine of 1.34 on admission. Down to 1.21 with IV fluids.  -Continue IV fluids; switch to D5 1/2 NS -Recheck BMP in AM -If no significant improvement, will obtain a renal ultrasound  Hypernatremia Iatrogenic secondary to normal saline IV fluids overnight in addition to no oral intake of nutrition/water -Switch to D5 1/2 NS -Recheck BMP this afternoon  Dementia On Xanax, Mirapex  Dysphagia Previous hospitalization, patient was on a carb modified diet. Unsure  Essential hypertension On losartan, amlodipine as an outpatient. Currently controlled -Continue to hold amlodipine as blood pressure is controlled -Hold  losartan in setting of AKI  Diabetes mellitus, type 2 On metformin as an outpatient -Continue SSI   DVT prophylaxis: Lovenox Code Status:   Code Status: DNR Family Communication: Daughter on telephone Disposition Plan: Discharge home likely in 24-48 hours pending culture data and afebrile x24 hours with improvement of mental status closer to baseline   Consultants:   None  Procedures:   None  Antimicrobials:  Ceftriaxone    Subjective: Patient non-verbal with me. Afebrile overnight.  Objective: Vitals:   07/06/18 2130 07/06/18 2323 07/07/18 0620 07/07/18 0816  BP: 126/79 120/79 105/72   Pulse: 100 (!) 102 (!) 101   Resp: 18 18 16    Temp:  98.3 F (36.8 C) 98.1 F (36.7 C)   TempSrc:  Oral Oral   SpO2: 97% 96% 98% 98%  Weight:      Height:        Intake/Output Summary (Last 24 hours) at 07/07/2018 1044 Last data filed at 07/07/2018 0340 Gross per 24 hour  Intake 654.17 ml  Output -  Net 654.17 ml   Filed Weights   07/06/18 1813  Weight: 85.3 kg    Examination:  General exam: Appears calm and comfortable Respiratory system: Clear to auscultation. Respiratory effort normal. Cardiovascular system: S1 & S2 heard, RRR. No murmurs, rubs, gallops or clicks. Gastrointestinal system: Abdomen is nondistended, soft and nontender. No organomegaly or masses felt. Normal bowel sounds heard. Central nervous system: Alert. Does not answer questions. Squeezes my fingers when placed by her but otherwise does not seem to follow commands. Tracks me through the room and smiles at me as well Extremities: No edema. No calf tenderness Skin: No cyanosis. No rashes Psychiatry: Judgement and insight appear impaired.  Flat affect    Data Reviewed: I have personally reviewed following labs and imaging studies  CBC: Recent Labs  Lab 07/06/18 1811 07/07/18 0706  WBC 13.7* 11.9*  NEUTROABS 11.3*  --   HGB 11.4* 10.5*  HCT 37.8 36.2  MCV 85.1 88.3  PLT 348 829   Basic  Metabolic Panel: Recent Labs  Lab 07/06/18 1811 07/07/18 0706  NA 148* 154*  K 4.0 3.8  CL 114* 122*  CO2 22 24  GLUCOSE 443* 110*  BUN 63* 65*  CREATININE 1.34* 1.21*  CALCIUM 9.8 9.8   GFR: Estimated Creatinine Clearance: 42.3 mL/min (A) (by C-G formula based on SCr of 1.21 mg/dL (H)). Liver Function Tests: Recent Labs  Lab 07/06/18 1811  AST 15  ALT 18  ALKPHOS 72  BILITOT 0.8  PROT 7.6  ALBUMIN 3.4*   No results for input(s): LIPASE, AMYLASE in the last 168 hours. No results for input(s): AMMONIA in the last 168 hours. Coagulation Profile: No results for input(s): INR, PROTIME in the last 168 hours. Cardiac Enzymes: Recent Labs  Lab 07/06/18 1811  TROPONINI <0.03   BNP (last 3 results) No results for input(s): PROBNP in the last 8760 hours. HbA1C: No results for input(s): HGBA1C in the last 72 hours. CBG: Recent Labs  Lab 07/06/18 1938 07/06/18 2246 07/07/18 0014 07/07/18 0346 07/07/18 0725  GLUCAP 362* 344* 274* 94 105*   Lipid Profile: No results for input(s): CHOL, HDL, LDLCALC, TRIG, CHOLHDL, LDLDIRECT in the last 72 hours. Thyroid Function Tests: No results for input(s): TSH, T4TOTAL, FREET4, T3FREE, THYROIDAB in the last 72 hours. Anemia Panel: No results for input(s): VITAMINB12, FOLATE, FERRITIN, TIBC, IRON, RETICCTPCT in the last 72 hours. Sepsis Labs: Recent Labs  Lab 07/06/18 1811  LATICACIDVEN 1.8    Recent Results (from the past 240 hour(s))  Blood culture (routine x 2)     Status: None (Preliminary result)   Collection Time: 07/06/18  6:06 PM  Result Value Ref Range Status   Specimen Description   Final    BLOOD RIGHT HAND Performed at [facility], [place] [address], [place], [place] [address]    Special Requests   Final    BOTTLES DRAWN AEROBIC AND ANAEROBIC Blood Culture results may not be optimal due to an inadequate volume of blood received in culture bottles   Culture   Final    NO GROWTH < 12 HOURS  Performed at [facility] [address], [place], [place] [address]    Report Status PENDING  Incomplete  Blood culture (routine x 2)     Status: None (Preliminary result)   Collection Time: 07/06/18  6:13 PM  Result Value Ref Range Status   Specimen Description   Final    BLOOD LEFT HAND Performed at [facility] [address], [place], [place] [address]    Special Requests   Final    BOTTLES DRAWN AEROBIC AND ANAEROBIC Blood Culture adequate volume   Culture   Final    NO GROWTH < 12 HOURS Performed at [facility], [place] [address], [place], [place] [address]    Report Status PENDING  Incomplete  SARS Coronavirus 2 (CEPHEID - Performed in [facility] hospital lab), Hosp Order     Status: None   Collection Time: 07/06/18  8:11 PM  Result Value Ref Range Status   SARS Coronavirus 2 NEGATIVE NEGATIVE Final    Comment: (NOTE) If result is NEGATIVE SARS-CoV-2 target  nucleic acids are NOT DETECTED. The SARS-CoV-2 RNA is generally detectable in upper and lower  respiratory specimens during the acute phase of infection. The lowest  concentration of SARS-CoV-2 viral copies this assay can detect is 250  copies / mL. A negative result does not preclude SARS-CoV-2 infection  and should not be used as the sole basis for treatment or other  patient management decisions.  A negative result may occur with  improper specimen collection / handling, submission of specimen other  than nasopharyngeal swab, presence of viral mutation(s) within the  areas targeted by this assay, and inadequate number of viral copies  (<250 copies / mL). A negative result must be combined with clinical  observations, patient history, and epidemiological information. If result is POSITIVE SARS-CoV-2 target nucleic acids are DETECTED. The SARS-CoV-2 RNA is generally detectable in upper and lower  respiratory specimens dur ing the acute phase of infection.  Positive  results are  indicative of active infection with SARS-CoV-2.  Clinical  correlation with patient history and other diagnostic information is  necessary to determine patient infection status.  Positive results do  not rule out bacterial infection or co-infection with other viruses. If result is PRESUMPTIVE POSTIVE SARS-CoV-2 nucleic acids MAY BE PRESENT.   A presumptive positive result was obtained on the submitted specimen  and confirmed on repeat testing.  While 2019 novel coronavirus  (SARS-CoV-2) nucleic acids may be present in the submitted sample  additional confirmatory testing may be necessary for epidemiological  and / or clinical management purposes  to differentiate between  SARS-CoV-2 and other Sarbecovirus currently known to infect humans.  If clinically indicated additional testing with an alternate test  methodology [phone]) is advised. The SARS-CoV-2 RNA is generally  detectable in upper and lower respiratory sp ecimens during the acute  phase of infection. The expected result is Negative. Fact Sheet for Patients:  StrictlyIdeas.no Fact Sheet for Healthcare Providers: BankingDealers.co.za This test is not yet approved or cleared by the [place] FDA and has been authorized for detection and/or diagnosis of SARS-CoV-2 by FDA under an Emergency Use Authorization (EUA).  This EUA will remain in effect (meaning this test can be used) for the duration of the COVID-19 declaration under Section 564(b)(1) of the Act, 21 U.S.C. section 360bbb-3(b)(1), unless the authorization is terminated or revoked sooner. Performed at [facility], [place] [address] [place], [place] [address]          Radiology Studies: Dg Chest Port 1 View  Result Date: 07/06/2018 CLINICAL DATA:  Fever. EXAM: PORTABLE CHEST 1 VIEW COMPARISON:  Radiograph 06/12/2018 FINDINGS: Low lung volumes.The cardiomediastinal contours are unchanged with mild  aortic tortuosity. Pulmonary vasculature is normal. No consolidation, pleural effusion, or pneumothorax. No acute osseous abnormalities are seen. IMPRESSION: Low lung volumes without acute abnormality. Electronically Signed   By: Keith Rake M.D.   On: 07/06/2018 19:51        Scheduled Meds: . enoxaparin (LOVENOX) injection  40 mg Subcutaneous QHS  . insulin aspart  0-9 Units Subcutaneous Q4H   Continuous Infusions: . sodium chloride 125 mL/hr at 07/07/18 0958  . cefTRIAXone (ROCEPHIN)  IV       LOS: 1 day     Cordelia Poche, MD [facility] 07/07/2018, 10:44 AM  If 7PM-7AM, please contact night-coverage [email or web address]

## 2018-07-07 NOTE — Evaluation (Signed)
Clinical/Bedside Swallow Evaluation Patient Details  Name: [name] MRN: [id number] Date of Birth: 09-21-1940  Today's Date: 07/07/2018 Time: SLP Start Time (ACUTE ONLY): 1640 SLP Stop Time (ACUTE ONLY): 4650 SLP Time Calculation (min) (ACUTE ONLY): 35 min  Past Medical History:  Past Medical History:  Diagnosis Date  . Anxiety   . Dementia ([place])   . Depression   . Diabetes mellitus   . Dyspnea   . GERD (gastroesophageal reflux disease)   . Headache(784.0)   . HTN (hypertension)    dr Johnsie Cancel  . Hypercholesteremia    DENIES  . Osteoarthritis   . PONV (postoperative nausea and vomiting)   . RLS (restless legs syndrome)   . Stress   . UTI (urinary tract infection) 05/27/2014   Past Surgical History:  Past Surgical History:  Procedure Laterality Date  . ABDOMINAL HYSTERECTOMY    . COLONOSCOPY    . JOINT REPLACEMENT    . left forearm fracture with ORIF Left   . LUMBAR LAMINECTOMY/DECOMPRESSION MICRODISCECTOMY N/A 04/18/2012   Procedure: LUMBAR LAMINECTOMY/DECOMPRESSION MICRODISCECTOMY 3 LEVELS;  Surgeon: Winfield Cunas, MD;  Location: [facility];  Service: Neurosurgery;  Laterality: N/A;  Lumbar three-four, lumbar four-five, lumbar five-sacral one decompression. Synovial cyst resection lumbar four-five  . ORIF PERIPROSTHETIC FRACTURE Right 07/06/2016   Procedure: OPEN REDUCTION INTERNAL FIXATION (ORIF) PERIPROSTHETIC FRACTURE;  Surgeon: Gaynelle Arabian, MD;  Location: [facility];  Service: Orthopedics;  Laterality: Right;  . REPLACEMENT TOTAL KNEE Right   . WISDOM TOOTH EXTRACTION     HPI:  Patient is a 78 y.o. female with PMH: advanced dementia, DM2, HTN, recurrent UTI's, dysphagia, indwelling foley catheter at baseline, who presented with worsening confusion and foul smelling urine. She is being treated for CAUTI and undergoing workup for sepsis. Patient with h/o dysphagia with most recent MBS in 2016, which revealed silent aspiration of honey thick liquids and recommended  puree solids only but with significant aspiration risk.   Assessment / Plan / Recommendation Clinical Impression  Patient presents with a mod-severe oropharyngel dysphagia which is characterized by delayed swallow initiation and decreased oral transit of puree and honey thick liquids. Although patient did not exhbiit any overt s/s of aspiration or penetration, she has a h/o dysphagia with most recent MBS, in 2016, revealing silent aspiration of honey thick liquids. Per patient's daughter, most recently she has been on a soft solid diet (not pureed) and nectar thick liquids. As patient's dysphagia has historically been quite impaired, plan to conservatively manage and recommend puree solids (Dys 1), pudding thick liquids and plan for MBS prior to discharge to compare to previous and to make recommendations for safest PO's. SLP Visit Diagnosis: Dysphagia, unspecified (R13.10)    Aspiration Risk  Moderate aspiration risk;Severe aspiration risk    Diet Recommendation Pudding-thick liquid;Dysphagia 1 (Puree)   Liquid Administration via: Spoon Medication Administration: Crushed with puree Supervision: Full supervision/cueing for compensatory strategies;Staff to assist with self feeding Compensations: Slow rate;Minimize environmental distractions;Small sips/bites Postural Changes: Seated upright at 90 degrees    Other  Recommendations Oral Care Recommendations: Oral care BID Other Recommendations: Prohibited food (jello, ice cream, thin soups);Place PMSV during PO intake;Remove water pitcher;Clarify dietary restrictions;Order thickener from pharmacy   Follow up Recommendations Home health SLP(per daughter, patient was receiving [facility] SLP services that started in April of 2020)      Frequency and Duration min 2x/week  1 week       Prognosis Prognosis for Safe Diet Advancement: Guarded Barriers to Reach  Goals: Severity of deficits;Time post onset      Swallow Study   General Date of Onset:  07/06/18 HPI: Patient is a 78 y.o. female with PMH: advanced dementia, DM2, HTN, recurrent UTI's, dysphagia, indwelling foley catheter at baseline, who presented with worsening confusion and foul smelling urine. She is being treated for CAUTI and undergoing workup for sepsis. Patient with h/o dysphagia with most recent MBS in 2016, which revealed silent aspiration of honey thick liquids and recommended puree solids only but with significant aspiration risk. Type of Study: Bedside Swallow Evaluation Previous Swallow Assessment:  MBS in 2016 Diet Prior to this Study: NPO Temperature Spikes Noted: No Respiratory Status: Room air History of Recent Intubation: No Behavior/Cognition: Alert;Cooperative;Pleasant mood;Requires cueing;Doesn't follow directions Oral Cavity Assessment: Within Functional Limits Oral Care Completed by SLP: Yes Oral Cavity - Dentition: Adequate natural dentition Self-Feeding Abilities: Total assist Patient Positioning: Upright in bed Baseline Vocal Quality: Not observed Volitional Cough: Cognitively unable to elicit Volitional Swallow: Unable to elicit    Oral/Motor/Sensory Function Overall Oral Motor/Sensory Function: Other (comment)(unable to fully assess due to patient not following commands, no facial assymetry noted)   Ice Chips     Thin Liquid Thin Liquid: Not tested    Nectar Thick     Honey Thick Honey Thick Liquid: Impaired Presentation: Spoon Pharyngeal Phase Impairments: Suspected delayed Swallow   Puree Puree: Impaired Presentation: Spoon Pharyngeal Phase Impairments: Suspected delayed Swallow   Solid     Solid: Not tested      Nadara Mode Tarrell 07/07/2018,5:45 PM   Sonia Baller, MA, CCC-SLP Speech Therapy [facility] Pager: [phone]

## 2018-07-08 LAB — BLOOD CULTURE ID PANEL (REFLEXED)

## 2018-07-08 LAB — CBC
HCT: 32.7 % — ABNORMAL LOW (ref 36.0–46.0)
Hemoglobin: 9.7 g/dL — ABNORMAL LOW (ref 12.0–15.0)
MCH: 24.9 pg — ABNORMAL LOW (ref 26.0–34.0)
MCHC: 29.7 g/dL — ABNORMAL LOW (ref 30.0–36.0)
MCV: 84.1 fL (ref 80.0–100.0)
Platelets: 212 10*3/uL (ref 150–400)
RBC: 3.89 MIL/uL (ref 3.87–5.11)
RDW: 17 % — ABNORMAL HIGH (ref 11.5–15.5)
WBC: 9.7 10*3/uL (ref 4.0–10.5)
nRBC: 0 % (ref 0.0–0.2)

## 2018-07-08 LAB — GLUCOSE, CAPILLARY
Glucose-Capillary: 191 mg/dL — ABNORMAL HIGH (ref 70–99)
Glucose-Capillary: 209 mg/dL — ABNORMAL HIGH (ref 70–99)
Glucose-Capillary: 216 mg/dL — ABNORMAL HIGH (ref 70–99)
Glucose-Capillary: 246 mg/dL — ABNORMAL HIGH (ref 70–99)
Glucose-Capillary: 258 mg/dL — ABNORMAL HIGH (ref 70–99)
Glucose-Capillary: 291 mg/dL — ABNORMAL HIGH (ref 70–99)

## 2018-07-08 LAB — BASIC METABOLIC PANEL
Anion gap: 9 (ref 5–15)
BUN: 56 mg/dL — ABNORMAL HIGH (ref 8–23)
CO2: 19 mmol/L — ABNORMAL LOW (ref 22–32)
Calcium: 9.3 mg/dL (ref 8.9–10.3)
Chloride: 123 mmol/L — ABNORMAL HIGH (ref 98–111)
Creatinine, Ser: 1.09 mg/dL — ABNORMAL HIGH (ref 0.44–1.00)
GFR calc Af Amer: 56 mL/min — ABNORMAL LOW (ref >60)
GFR calc non Af Amer: 48 mL/min — ABNORMAL LOW (ref >60)
Glucose, Bld: 228 mg/dL — ABNORMAL HIGH (ref 70–99)
Potassium: 3.7 mmol/L (ref 3.5–5.1)
Sodium: 151 mmol/L — ABNORMAL HIGH (ref 135–145)

## 2018-07-08 LAB — URINE CULTURE: Culture: >=100000 — AB

## 2018-07-08 MED ORDER — INSULIN ASPART 100 UNIT/ML ~~LOC~~ SOLN
0.0000 [IU] | Freq: Three times a day (TID) | SUBCUTANEOUS | Status: DC
  Administered 2018-07-09 (×2): 5 [IU] via SUBCUTANEOUS
  Administered 2018-07-09: 17:00:00 3 [IU] via SUBCUTANEOUS
  Administered 2018-07-10: 2 [IU] via SUBCUTANEOUS
  Administered 2018-07-10 – 2018-07-11 (×4): 3 [IU] via SUBCUTANEOUS

## 2018-07-08 MED ORDER — DEXTROSE 5 % IV SOLN
INTRAVENOUS | Status: DC
  Administered 2018-07-08 – 2018-07-10 (×3): via INTRAVENOUS

## 2018-07-08 NOTE — Progress Notes (Signed)
PHARMACY - PHYSICIAN COMMUNICATION CRITICAL VALUE ALERT - BLOOD CULTURE IDENTIFICATION (BCID)  [name] is an 78 y.o. female who presented to [facility] on 07/06/2018 with a chief complaint of UTI  Assessment:  Urine (include suspected source if known) 1 of 4 bottles staph species, MecA+. Likely contaminant. WBC=11.9, afebrile  Name of physician (or Provider) Contacted: C Bodenheimer,NP  Current antibiotics: Rocephin  Changes to prescribed antibiotics recommended:  Patient is on recommended antibiotics - No changes needed  Results for orders placed or performed during the hospital encounter of 07/06/18  Blood Culture ID Panel (Reflexed) (Collected: 07/06/2018  6:13 PM)  Result Value Ref Range   Enterococcus species NOT DETECTED NOT DETECTED   Listeria monocytogenes NOT DETECTED NOT DETECTED   Staphylococcus species DETECTED (A) NOT DETECTED   Staphylococcus aureus (BCID) NOT DETECTED NOT DETECTED   Methicillin resistance DETECTED (A) NOT DETECTED   Streptococcus species NOT DETECTED NOT DETECTED   Streptococcus agalactiae NOT DETECTED NOT DETECTED   Streptococcus pneumoniae NOT DETECTED NOT DETECTED   Streptococcus pyogenes NOT DETECTED NOT DETECTED   Acinetobacter baumannii NOT DETECTED NOT DETECTED   Enterobacteriaceae species NOT DETECTED NOT DETECTED   Enterobacter cloacae complex NOT DETECTED NOT DETECTED   Escherichia coli NOT DETECTED NOT DETECTED   Klebsiella oxytoca NOT DETECTED NOT DETECTED   Klebsiella pneumoniae NOT DETECTED NOT DETECTED   Proteus species NOT DETECTED NOT DETECTED   Serratia marcescens NOT DETECTED NOT DETECTED   Haemophilus influenzae NOT DETECTED NOT DETECTED   Neisseria meningitidis NOT DETECTED NOT DETECTED   Pseudomonas aeruginosa NOT DETECTED NOT DETECTED   Candida albicans NOT DETECTED NOT DETECTED   Candida glabrata NOT DETECTED NOT DETECTED   Candida krusei NOT DETECTED NOT DETECTED   Candida parapsilosis NOT DETECTED NOT  DETECTED   Candida tropicalis NOT DETECTED NOT DETECTED    Dorrene German 07/08/2018  1:03 AM

## 2018-07-08 NOTE — Progress Notes (Addendum)
PROGRESS NOTE    [name]  IRJ:[id number] DOB: [birth year] DOA: 07/06/2018 PCP: [name], MD   Brief Narrative: [name] is a 78 y.o. female with medical history significant of advanced dementia, DM2, HTN, recurrent UTIs, dysphagia, indwelling foley catheter at baseline. She presented secondary to worsening confusion and foul smelling urine. She is being treated for a CAUTI and worked up for sepsis.   Assessment & Plan:   Principal Problem:   Urinary tract infection associated with catheterization of urinary tract, initial encounter [facility]) Active Problems:   Essential hypertension   Sepsis ([place])   Acute kidney injury ([place])   Dementia ([place])   Dysphagia   DM type 2 (diabetes mellitus, type 2) ([facility])   Pressure injury of skin   Altered mental status   Severe sepsis Present on admission. Secondary to CAUTI. Started on Ceftriaxone on admission. -Management below  CAUTI Foley is chronic. Started on ceftriaxone.  Urine culture significant for multiple species and blood cultures significant for MR staph species; likely contaminant.   -Continue Ceftriaxone  Acute metabolic encephalopathy Likely secondary to infection on top of underlying infection in addition to losartan use as an outpatient.  Appears to be at baseline. -Treat infection as mentioned above  Acute kidney injury Baseline creatinine of 0.7. Creatinine of 1.34 on admission. Down to 1.21 with IV fluids.  -Continue IV fluids; switch to D5 water -Recheck BMP in AM  Hypernatremia Iatrogenic secondary to normal saline IV fluids overnight in addition to no oral intake of nutrition/water -Switch to D5 water -Recheck BMP this afternoon  Dementia On Xanax, Mirapex  Dysphagia Previous hospitalization, patient was on a carb modified diet.  Speech therapy was consulted and recommends any dysphagia 1 diet.  Plan for modified barium swallow as patient has a history of silent aspiration.  Essential  hypertension On losartan, amlodipine as an outpatient. Currently controlled -Continue to hold amlodipine as blood pressure is controlled -Hold losartan in setting of AKI  Diabetes mellitus, type 2 On metformin as an outpatient -Continue SSI   DVT prophylaxis: Lovenox Code Status:   Code Status: DNR Family Communication: Daughter on telephone Disposition Plan: Discharge home likely in 24-48 hours pending culture data and afebrile x24 hours with improvement of mental status closer to baseline   Consultants:   None  Procedures:   None  Antimicrobials:  Ceftriaxone    Subjective: Patient reports no issues.  Objective: Vitals:   07/07/18 0816 07/07/18 1318 07/07/18 2139 07/08/18 0428  BP:  99/70 (!) 120/53 (!) 129/91  Pulse:  (!) 57 67 88  Resp:  16 12 18   Temp:  98.2 F (36.8 C) 98.1 F (36.7 C) 97.9 F (36.6 C)  TempSrc:  Oral Oral Oral  SpO2: 98% 97% 98% 99%  Weight:      Height:        Intake/Output Summary (Last 24 hours) at 07/08/2018 1107 Last data filed at 07/08/2018 0831 Gross per 24 hour  Intake 1822.13 ml  Output 650 ml  Net 1172.13 ml   Filed Weights   07/06/18 1813  Weight: 85.3 kg    Examination:  General exam: Appears calm and comfortable Respiratory system: Clear to auscultation. Respiratory effort normal. Cardiovascular system: S1 & S2 heard, RRR. No murmurs, rubs, gallops or clicks. Gastrointestinal system: Abdomen is nondistended, soft and nontender. No organomegaly or masses felt. Normal bowel sounds heard. Central nervous system: Alert and oriented to person and place. Extremities: No edema. No calf tenderness Skin: No cyanosis.  No rashes Psychiatry: Judgement and insight appear impaired. Mood & affect appropriate.     Data Reviewed: I have personally reviewed following labs and imaging studies  CBC: Recent Labs  Lab 07/06/18 1811 07/07/18 0706 07/08/18 0556  WBC 13.7* 11.9* 9.7  NEUTROABS 11.3*  --   --   HGB 11.4*  10.5* 9.7*  HCT 37.8 36.2 32.7*  MCV 85.1 88.3 84.1  PLT 348 288 782   Basic Metabolic Panel: Recent Labs  Lab 07/06/18 1811 07/07/18 0706 07/07/18 1640 07/08/18 0556  NA 148* 154* 153* 151*  K 4.0 3.8 4.0 3.7  CL 114* 122* 124* 123*  CO2 22 24 20* 19*  GLUCOSE 443* 110* 244* 228*  BUN 63* 65* 61* 56*  CREATININE 1.34* 1.21* 1.23* 1.09*  CALCIUM 9.8 9.8 9.4 9.3   GFR: Estimated Creatinine Clearance: 47 mL/min (A) (by C-G formula based on SCr of 1.09 mg/dL (H)). Liver Function Tests: Recent Labs  Lab 07/06/18 1811  AST 15  ALT 18  ALKPHOS 72  BILITOT 0.8  PROT 7.6  ALBUMIN 3.4*   No results for input(s): LIPASE, AMYLASE in the last 168 hours. No results for input(s): AMMONIA in the last 168 hours. Coagulation Profile: No results for input(s): INR, PROTIME in the last 168 hours. Cardiac Enzymes: Recent Labs  Lab 07/06/18 1811  TROPONINI <0.03   BNP (last 3 results) No results for input(s): PROBNP in the last 8760 hours. HbA1C: No results for input(s): HGBA1C in the last 72 hours. CBG: Recent Labs  Lab 07/07/18 1632 07/07/18 2141 07/08/18 0006 07/08/18 0425 07/08/18 0714  GLUCAP 205* 247* 291* 216* 191*   Lipid Profile: No results for input(s): CHOL, HDL, LDLCALC, TRIG, CHOLHDL, LDLDIRECT in the last 72 hours. Thyroid Function Tests: No results for input(s): TSH, T4TOTAL, FREET4, T3FREE, THYROIDAB in the last 72 hours. Anemia Panel: No results for input(s): VITAMINB12, FOLATE, FERRITIN, TIBC, IRON, RETICCTPCT in the last 72 hours. Sepsis Labs: Recent Labs  Lab 07/06/18 1811  LATICACIDVEN 1.8    Recent Results (from the past 240 hour(s))  Blood culture (routine x 2)     Status: None (Preliminary result)   Collection Time: 07/06/18  6:06 PM  Result Value Ref Range Status   Specimen Description   Final    BLOOD RIGHT HAND Performed at [facility], [place] [address] [place], [place] [address]    Special Requests   Final     BOTTLES DRAWN AEROBIC AND ANAEROBIC Blood Culture results may not be optimal due to an inadequate volume of blood received in culture bottles   Culture   Final    NO GROWTH < 12 HOURS Performed at [place] [facility] [address] [place], [place] [address]    Report Status PENDING  Incomplete  Blood culture (routine x 2)     Status: None (Preliminary result)   Collection Time: 07/06/18  6:13 PM  Result Value Ref Range Status   Specimen Description   Final    BLOOD LEFT HAND Performed at [place] [address] [place], [place] [address]    Special Requests   Final    BOTTLES DRAWN AEROBIC AND ANAEROBIC Blood Culture adequate volume   Culture  Setup Time   Final    ANAEROBIC BOTTLE ONLY GRAM POSITIVE COCCI IN PAIRS RESULT CALLED TO, READ BACK BY AND VERIFIED WITH: BETH GREENE @ 6295 ON 07/08/18 BY ROBINSON Z.     Culture   Final  CULTURE REINCUBATED FOR BETTER GROWTH Performed at [facility], [place] [address], [place], [place] [address]    Report Status PENDING  Incomplete  Blood Culture ID Panel (Reflexed)     Status: Abnormal   Collection Time: 07/06/18  6:13 PM  Result Value Ref Range Status   Enterococcus species NOT DETECTED NOT DETECTED Final   Listeria monocytogenes NOT DETECTED NOT DETECTED Final   Staphylococcus species DETECTED (A) NOT DETECTED Final    Comment: Methicillin (oxacillin) resistant coagulase negative staphylococcus. Possible blood culture contaminant (unless isolated from more than one blood culture draw or clinical case suggests pathogenicity). No antibiotic treatment is indicated for blood  culture contaminants. CRITICAL RESULT CALLED TO, READ BACK BY AND VERIFIED WITH: BETH GREENE @ 0056 ON 07/08/18 BY ROBINSON Z.     Staphylococcus aureus (BCID) NOT DETECTED NOT DETECTED Final   Methicillin resistance DETECTED (A) NOT DETECTED Final    Comment: CRITICAL RESULT CALLED TO, READ BACK BY AND VERIFIED WITH: BETH GREENE @  0056 ON 07/08/18 BY ROBINSON Z.     Streptococcus species NOT DETECTED NOT DETECTED Final   Streptococcus agalactiae NOT DETECTED NOT DETECTED Final   Streptococcus pneumoniae NOT DETECTED NOT DETECTED Final   Streptococcus pyogenes NOT DETECTED NOT DETECTED Final   Acinetobacter baumannii NOT DETECTED NOT DETECTED Final   Enterobacteriaceae species NOT DETECTED NOT DETECTED Final   Enterobacter cloacae complex NOT DETECTED NOT DETECTED Final   Escherichia coli NOT DETECTED NOT DETECTED Final   Klebsiella oxytoca NOT DETECTED NOT DETECTED Final   Klebsiella pneumoniae NOT DETECTED NOT DETECTED Final   Proteus species NOT DETECTED NOT DETECTED Final   Serratia marcescens NOT DETECTED NOT DETECTED Final   Haemophilus influenzae NOT DETECTED NOT DETECTED Final   Neisseria meningitidis NOT DETECTED NOT DETECTED Final   Pseudomonas aeruginosa NOT DETECTED NOT DETECTED Final   Candida albicans NOT DETECTED NOT DETECTED Final   Candida glabrata NOT DETECTED NOT DETECTED Final   Candida krusei NOT DETECTED NOT DETECTED Final   Candida parapsilosis NOT DETECTED NOT DETECTED Final   Candida tropicalis NOT DETECTED NOT DETECTED Final    Comment: Performed at [facility], [place] [address], [place], [place] [address]  Urine culture     Status: Abnormal   Collection Time: 07/06/18  7:43 PM  Result Value Ref Range Status   Specimen Description   Final    URINE, CLEAN CATCH Performed at [facility], [place] [address] [place], [place] [address]    Special Requests   Final    NONE Performed at [facility], [place] [address], [place], [place] [address]    Culture (A)  Final    >=100,000 COLONIES/mL MULTIPLE SPECIES PRESENT, SUGGEST RECOLLECTION   Report Status 07/08/2018 FINAL  Final  SARS Coronavirus 2 (CEPHEID - Performed in [place] hospital lab), Hosp Order     Status: None   Collection Time: 07/06/18  8:11 PM  Result Value Ref Range Status   SARS  Coronavirus 2 NEGATIVE NEGATIVE Final    Comment: (NOTE) If result is NEGATIVE SARS-CoV-2 target nucleic acids are NOT DETECTED. The SARS-CoV-2 RNA is generally detectable in upper and lower  respiratory specimens during the acute phase of infection. The lowest  concentration of SARS-CoV-2 viral copies this assay can detect is 250  copies / mL. A negative result does not preclude SARS-CoV-2 infection  and should not be used as the sole basis for treatment or other  patient  management decisions.  A negative result may occur with  improper specimen collection / handling, submission of specimen other  than nasopharyngeal swab, presence of viral mutation(s) within the  areas targeted by this assay, and inadequate number of viral copies  (<250 copies / mL). A negative result must be combined with clinical  observations, patient history, and epidemiological information. If result is POSITIVE SARS-CoV-2 target nucleic acids are DETECTED. The SARS-CoV-2 RNA is generally detectable in upper and lower  respiratory specimens dur ing the acute phase of infection.  Positive  results are indicative of active infection with SARS-CoV-2.  Clinical  correlation with patient history and other diagnostic information is  necessary to determine patient infection status.  Positive results do  not rule out bacterial infection or co-infection with other viruses. If result is PRESUMPTIVE POSTIVE SARS-CoV-2 nucleic acids MAY BE PRESENT.   A presumptive positive result was obtained on the submitted specimen  and confirmed on repeat testing.  While 2019 novel coronavirus  (SARS-CoV-2) nucleic acids may be present in the submitted sample  additional confirmatory testing may be necessary for epidemiological  and / or clinical management purposes  to differentiate between  SARS-CoV-2 and other Sarbecovirus currently known to infect humans.  If clinically indicated additional testing with an alternate test    methodology [phone]) is advised. The SARS-CoV-2 RNA is generally  detectable in upper and lower respiratory sp ecimens during the acute  phase of infection. The expected result is Negative. Fact Sheet for Patients:  StrictlyIdeas.no Fact Sheet for Healthcare Providers: BankingDealers.co.za This test is not yet approved or cleared by the [place] FDA and has been authorized for detection and/or diagnosis of SARS-CoV-2 by FDA under an Emergency Use Authorization (EUA).  This EUA will remain in effect (meaning this test can be used) for the duration of the COVID-19 declaration under Section 564(b)(1) of the Act, 21 U.S.C. section 360bbb-3(b)(1), unless the authorization is terminated or revoked sooner. Performed at [facility], [place] [address], [place], [place] [address]          Radiology Studies: Dg Chest Port 1 View  Result Date: 07/06/2018 CLINICAL DATA:  Fever. EXAM: PORTABLE CHEST 1 VIEW COMPARISON:  Radiograph 06/12/2018 FINDINGS: Low lung volumes.The cardiomediastinal contours are unchanged with mild aortic tortuosity. Pulmonary vasculature is normal. No consolidation, pleural effusion, or pneumothorax. No acute osseous abnormalities are seen. IMPRESSION: Low lung volumes without acute abnormality. Electronically Signed   By: Keith Rake M.D.   On: 07/06/2018 19:51        Scheduled Meds:  enoxaparin (LOVENOX) injection  40 mg Subcutaneous QHS   insulin aspart  0-9 Units Subcutaneous Q4H   Continuous Infusions:  cefTRIAXone (ROCEPHIN)  IV Stopped (07/07/18 1754)   dextrose 75 mL/hr at 07/08/18 1027     LOS: 2 days     Cordelia Poche, MD [facility] 07/08/2018, 11:07 AM  If 7PM-7AM, please contact night-coverage [email or web address]

## 2018-07-09 ENCOUNTER — Inpatient Hospital Stay (HOSPITAL_COMMUNITY): Payer: Medicare Other

## 2018-07-09 LAB — BASIC METABOLIC PANEL
Anion gap: 9 (ref 5–15)
BUN: 42 mg/dL — ABNORMAL HIGH (ref 8–23)
CO2: 19 mmol/L — ABNORMAL LOW (ref 22–32)
Calcium: 9.2 mg/dL (ref 8.9–10.3)
Chloride: 120 mmol/L — ABNORMAL HIGH (ref 98–111)
Creatinine, Ser: 0.98 mg/dL (ref 0.44–1.00)
GFR calc Af Amer: >60 mL/min (ref >60)
GFR calc non Af Amer: 55 mL/min — ABNORMAL LOW (ref >60)
Glucose, Bld: 174 mg/dL — ABNORMAL HIGH (ref 70–99)
Potassium: 3.2 mmol/L — ABNORMAL LOW (ref 3.5–5.1)
Sodium: 148 mmol/L — ABNORMAL HIGH (ref 135–145)

## 2018-07-09 LAB — GLUCOSE, CAPILLARY
Glucose-Capillary: 245 mg/dL — ABNORMAL HIGH (ref 70–99)
Glucose-Capillary: 204 mg/dL — ABNORMAL HIGH (ref 70–99)
Glucose-Capillary: 166 mg/dL — ABNORMAL HIGH (ref 70–99)
Glucose-Capillary: 161 mg/dL — ABNORMAL HIGH (ref 70–99)

## 2018-07-09 MED ORDER — RESOURCE THICKENUP CLEAR PO POWD
ORAL | Status: DC | PRN
  Administered 2018-07-09: 09:00:00 via ORAL
  Filled 2018-07-09: qty 125

## 2018-07-09 MED ORDER — INSULIN DETEMIR 100 UNIT/ML ~~LOC~~ SOLN
12.0000 [IU] | Freq: Every day | SUBCUTANEOUS | Status: DC
  Administered 2018-07-09 – 2018-07-10 (×2): 12 [IU] via SUBCUTANEOUS
  Filled 2018-07-09 (×3): qty 0.12

## 2018-07-09 NOTE — Progress Notes (Signed)
[facility]  PROGRESS NOTE  [name] OHY:[id number] DOB: 05/24/1940 DOA: 07/06/2018 PCP: [name], MD   Brief HPI:   78 year old female with history of advanced dementia, diabetes mellitus type 2, hypertension, recurrent UTIs, dysphagia, indwelling Foley catheter at baseline, who presented with worsening confusion and foul-smelling urine.  Patient has been treated for CAUTI.    Subjective   This morning patient seems comfortable, denies chest pain or shortness of breath.  Eating her breakfast comfortably.   Assessment/Plan:     1. Severe sepsis-sepsis physiology has resolved.  Secondary to CAUTI, started on ceftriaxone.  2. CAUTI-patient has chronic Foley catheter, urine culture growing multiple species.  Will continue with Ceftriaxone.  3. Coagulase-negative staph bacteremia-blood cultures x1 bottle, anaerobic growing coagulase-negative staph, methicillin-resistant.  Likely contaminant.  Would not treat at this time.  Follow final culture and sensitivity results.  4. Metabolic encephalopathy-patient has underlying dementia, likely could have worsened from above.  Appears to be back to baseline.  5. Acute kidney injury-patient baseline creatinine 0.7, she presented with creatinine of 1.34 on admission.  After IV fluids creatinine is down to 1.09  6. Hypernatremia-patient presented with mild hypernatremia, likely from poor p.o. intake.  Started on D5W, sodium is slowly improving.  Will check BMP today.  7. Dysphagia-speech therapy was consulted for dysphagia, patient started on dysphagia 1 diet.  8. Hypertension-blood pressures controlled, amlodipine and losartan are on hold.  Will monitor patient blood pressure.  9. Diabetes mellitus type 2-continue sliding scale insulin with NovoLog.  Will restart Levemir at 12 units subcu daily.    CBG: Recent Labs  Lab 07/08/18 1622 07/08/18 2101 07/09/18 0742 07/09/18 1110 07/09/18 1625  GLUCAP 258* 246* 245*  204* 166*    CBC: Recent Labs  Lab 07/06/18 1811 07/07/18 0706 07/08/18 0556  WBC 13.7* 11.9* 9.7  NEUTROABS 11.3*  --   --   HGB 11.4* 10.5* 9.7*  HCT 37.8 36.2 32.7*  MCV 85.1 88.3 84.1  PLT 348 288 948    Basic Metabolic Panel: Recent Labs  Lab 07/06/18 1811 07/07/18 0706 07/07/18 1640 07/08/18 0556  NA 148* 154* 153* 151*  K 4.0 3.8 4.0 3.7  CL 114* 122* 124* 123*  CO2 22 24 20* 19*  GLUCOSE 443* 110* 244* 228*  BUN 63* 65* 61* 56*  CREATININE 1.34* 1.21* 1.23* 1.09*  CALCIUM 9.8 9.8 9.4 9.3     DVT prophylaxis: Lovenox  Code Status: DNR  Family Communication: No family at bedside  Disposition Plan: To be decided     Consultants:    Procedures:     Antibiotics:   Anti-infectives (From admission, onward)   Start     Dose/Rate Route Frequency Ordered Stop   07/07/18 1800  cefTRIAXone (ROCEPHIN) 1 g in sodium chloride 0.9 % 100 mL IVPB     1 g 200 mL/hr over 30 Minutes Intravenous Every 24 hours 07/06/18 2034     07/07/18 1600  cefTRIAXone (ROCEPHIN) 1 g in sodium chloride 0.9 % 100 mL IVPB  Status:  Discontinued     1 g 200 mL/hr over 30 Minutes Intravenous Every 24 hours 07/06/18 2033 07/06/18 2034   07/06/18 1800  cefTRIAXone (ROCEPHIN) 1 g in sodium chloride 0.9 % 100 mL IVPB     1 g 200 mL/hr over 30 Minutes Intravenous  Once 07/06/18 1753 07/06/18 2011       Objective   Vitals:   07/08/18 1250 07/08/18 1940 07/09/18 0445 07/09/18 1331  BP: 136/64  104/62 130/80 138/74  Pulse: (!) 58 79 89 97  Resp: 16 20 16 14   Temp: 98.8 F (37.1 C) 99 F (37.2 C) 98.6 F (37 C) 98.1 F (36.7 C)  TempSrc: Oral Oral Oral Axillary  SpO2: 97% 98% 100% 99%  Weight:      Height:        Intake/Output Summary (Last 24 hours) at 07/09/2018 1727 Last data filed at 07/09/2018 1720 Gross per 24 hour  Intake 2412.68 ml  Output 1900 ml  Net 512.68 ml   Filed Weights   07/06/18 1813  Weight: 85.3 kg     Physical  Examination:   General-appears in no acute distress Heart-S1-S2, regular, no murmur auscultated Lungs-clear to auscultation bilaterally, no wheezing or crackles auscultated Abdomen-soft, nontender, no organomegaly Extremities-no edema in the lower extremities Neuro-alert, oriented x1, no focal deficit noted    Data Reviewed: I have personally reviewed following labs and imaging studies   Recent Results (from the past 240 hour(s))  Blood culture (routine x 2)     Status: None (Preliminary result)   Collection Time: 07/06/18  6:06 PM  Result Value Ref Range Status   Specimen Description BLOOD RIGHT HAND  Final   Special Requests   Final    BOTTLES DRAWN AEROBIC AND ANAEROBIC Blood Culture results may not be optimal due to an inadequate volume of blood received in culture bottles   Culture   Final    NO GROWTH 3 DAYS Performed at [facility], [place] [address], [place], [place] [address]    Report Status PENDING  Incomplete  Blood culture (routine x 2)     Status: Abnormal (Preliminary result)   Collection Time: 07/06/18  6:13 PM  Result Value Ref Range Status   Specimen Description BLOOD LEFT HAND  Final   Special Requests   Final    BOTTLES DRAWN AEROBIC AND ANAEROBIC Blood Culture adequate volume   Culture  Setup Time   Final    ANAEROBIC BOTTLE ONLY GRAM POSITIVE COCCI IN PAIRS RESULT CALLED TO, READ BACK BY AND VERIFIED WITH: BETH GREENE @ 3009 ON 07/08/18 BY ROBINSON Z.     Culture (A)  Final    STAPHYLOCOCCUS SPECIES (COAGULASE NEGATIVE) THE SIGNIFICANCE OF ISOLATING THIS ORGANISM FROM A SINGLE SET OF BLOOD CULTURES WHEN MULTIPLE SETS ARE DRAWN IS UNCERTAIN. PLEASE NOTIFY THE MICROBIOLOGY DEPARTMENT WITHIN ONE WEEK IF SPECIATION AND SENSITIVITIES ARE REQUIRED. Performed at [facility], [place] [address] [place], [place] [address]    Report Status PENDING  Incomplete  Blood Culture ID Panel (Reflexed)     Status: Abnormal   Collection Time: 07/06/18  6:13 PM   Result Value Ref Range Status   Enterococcus species NOT DETECTED NOT DETECTED Final   Listeria monocytogenes NOT DETECTED NOT DETECTED Final   Staphylococcus species DETECTED (A) NOT DETECTED Final    Comment: Methicillin (oxacillin) resistant coagulase negative staphylococcus. Possible blood culture contaminant (unless isolated from more than one blood culture draw or clinical case suggests pathogenicity). No antibiotic treatment is indicated for blood  culture contaminants. CRITICAL RESULT CALLED TO, READ BACK BY AND VERIFIED WITH: BETH GREENE @ 0056 ON 07/08/18 BY ROBINSON Z.     Staphylococcus aureus (BCID) NOT DETECTED NOT DETECTED Final   Methicillin resistance DETECTED (A) NOT DETECTED Final    Comment: CRITICAL RESULT CALLED TO, READ BACK BY AND VERIFIED WITH: BETH GREENE @ 0056 ON 07/08/18 BY ROBINSON Z.     Streptococcus species  NOT DETECTED NOT DETECTED Final   Streptococcus agalactiae NOT DETECTED NOT DETECTED Final   Streptococcus pneumoniae NOT DETECTED NOT DETECTED Final   Streptococcus pyogenes NOT DETECTED NOT DETECTED Final   Acinetobacter baumannii NOT DETECTED NOT DETECTED Final   Enterobacteriaceae species NOT DETECTED NOT DETECTED Final   Enterobacter cloacae complex NOT DETECTED NOT DETECTED Final   Escherichia coli NOT DETECTED NOT DETECTED Final   Klebsiella oxytoca NOT DETECTED NOT DETECTED Final   Klebsiella pneumoniae NOT DETECTED NOT DETECTED Final   Proteus species NOT DETECTED NOT DETECTED Final   Serratia marcescens NOT DETECTED NOT DETECTED Final   Haemophilus influenzae NOT DETECTED NOT DETECTED Final   Neisseria meningitidis NOT DETECTED NOT DETECTED Final   Pseudomonas aeruginosa NOT DETECTED NOT DETECTED Final   Candida albicans NOT DETECTED NOT DETECTED Final   Candida glabrata NOT DETECTED NOT DETECTED Final   Candida krusei NOT DETECTED NOT DETECTED Final   Candida parapsilosis NOT DETECTED NOT DETECTED Final   Candida tropicalis NOT DETECTED  NOT DETECTED Final    Comment: Performed at [facility], [place] [address] [place], [place] [address]  Urine culture     Status: Abnormal   Collection Time: 07/06/18  7:43 PM  Result Value Ref Range Status   Specimen Description   Final    URINE, CLEAN CATCH Performed at [facility], [place] [address], [place], [place] [address]    Special Requests   Final    NONE Performed at [facility], [place] [address] [place], [place] [address]    Culture (A)  Final    >=100,000 COLONIES/mL MULTIPLE SPECIES PRESENT, SUGGEST RECOLLECTION   Report Status 07/08/2018 FINAL  Final  SARS Coronavirus 2 (CEPHEID - Performed in [facility] lab), Hosp Order     Status: None   Collection Time: 07/06/18  8:11 PM  Result Value Ref Range Status   SARS Coronavirus 2 NEGATIVE NEGATIVE Final    Comment: (NOTE) If result is NEGATIVE SARS-CoV-2 target nucleic acids are NOT DETECTED. The SARS-CoV-2 RNA is generally detectable in upper and lower  respiratory specimens during the acute phase of infection. The lowest  concentration of SARS-CoV-2 viral copies this assay can detect is 250  copies / mL. A negative result does not preclude SARS-CoV-2 infection  and should not be used as the sole basis for treatment or other  patient management decisions.  A negative result may occur with  improper specimen collection / handling, submission of specimen other  than nasopharyngeal swab, presence of viral mutation(s) within the  areas targeted by this assay, and inadequate number of viral copies  (<250 copies / mL). A negative result must be combined with clinical  observations, patient history, and epidemiological information. If result is POSITIVE SARS-CoV-2 target nucleic acids are DETECTED. The SARS-CoV-2 RNA is generally detectable in upper and lower  respiratory specimens dur ing the acute phase of infection.  Positive  results are indicative of active infection  with SARS-CoV-2.  Clinical  correlation with patient history and other diagnostic information is  necessary to determine patient infection status.  Positive results do  not rule out bacterial infection or co-infection with other viruses. If result is PRESUMPTIVE POSTIVE SARS-CoV-2 nucleic acids MAY BE PRESENT.   A presumptive positive result was obtained on the submitted specimen  and confirmed on repeat testing.  While 2019 novel coronavirus  (SARS-CoV-2) nucleic acids may be present in the submitted sample  additional confirmatory testing may  be necessary for epidemiological  and / or clinical management purposes  to differentiate between  SARS-CoV-2 and other Sarbecovirus currently known to infect humans.  If clinically indicated additional testing with an alternate test  methodology [phone]) is advised. The SARS-CoV-2 RNA is generally  detectable in upper and lower respiratory sp ecimens during the acute  phase of infection. The expected result is Negative. Fact Sheet for Patients:  StrictlyIdeas.no Fact Sheet for Healthcare Providers: BankingDealers.co.za This test is not yet approved or cleared by the [place] FDA and has been authorized for detection and/or diagnosis of SARS-CoV-2 by FDA under an Emergency Use Authorization (EUA).  This EUA will remain in effect (meaning this test can be used) for the duration of the COVID-19 declaration under Section 564(b)(1) of the Act, 21 U.S.C. section 360bbb-3(b)(1), unless the authorization is terminated or revoked sooner. Performed at [facility], [place] [address], [place], [place] [address]      Liver Function Tests: Recent Labs  Lab 07/06/18 1811  AST 15  ALT 18  ALKPHOS 72  BILITOT 0.8  PROT 7.6  ALBUMIN 3.4*   No results for input(s): LIPASE, AMYLASE in the last 168 hours. No results for input(s): AMMONIA in the last 168 hours.  Cardiac  Enzymes: Recent Labs  Lab 07/06/18 1811  TROPONINI <0.03   BNP (last 3 results) No results for input(s): BNP in the last 8760 hours.  ProBNP (last 3 results) No results for input(s): PROBNP in the last 8760 hours.    Studies: Dg Swallowing Func-speech Pathology  Result Date: 07/09/2018 Objective Swallowing Evaluation: Type of Study: MBS-Modified Barium Swallow Study  Patient Details Name: [name] MRN: [id number] Date of Birth: 03/08/1940 Today's Date: 07/09/2018 Time: SLP Start Time (ACUTE ONLY): 0845 -SLP Stop Time (ACUTE ONLY): 0905 SLP Time Calculation (min) (ACUTE ONLY): 20 min Past Medical History: Past Medical History: Diagnosis Date . Anxiety  . Dementia ([place])  . Depression  . Diabetes mellitus  . Dyspnea  . GERD (gastroesophageal reflux disease)  . Headache(784.0)  . HTN (hypertension)   dr Johnsie Cancel . Hypercholesteremia   DENIES . Osteoarthritis  . PONV (postoperative nausea and vomiting)  . RLS (restless legs syndrome)  . Stress  . UTI (urinary tract infection) 05/27/2014 Past Surgical History: Past Surgical History: Procedure Laterality Date . ABDOMINAL HYSTERECTOMY   . COLONOSCOPY   . JOINT REPLACEMENT   . left forearm fracture with ORIF Left  . LUMBAR LAMINECTOMY/DECOMPRESSION MICRODISCECTOMY N/A 04/18/2012  Procedure: LUMBAR LAMINECTOMY/DECOMPRESSION MICRODISCECTOMY 3 LEVELS;  Surgeon: Winfield Cunas, MD;  Location: [facility];  Service: Neurosurgery;  Laterality: N/A;  Lumbar three-four, lumbar four-five, lumbar five-sacral one decompression. Synovial cyst resection lumbar four-five . ORIF PERIPROSTHETIC FRACTURE Right 07/06/2016  Procedure: OPEN REDUCTION INTERNAL FIXATION (ORIF) PERIPROSTHETIC FRACTURE;  Surgeon: Gaynelle Arabian, MD;  Location: [facility];  Service: Orthopedics;  Laterality: Right; . REPLACEMENT TOTAL KNEE Right  . WISDOM TOOTH EXTRACTION   HPI: Patient is a 78 y.o. female with PMH: advanced dementia, DM2, HTN, recurrent UTI's, dysphagia, indwelling foley catheter at  baseline, who presented with worsening confusion and foul smelling urine. She is being treated for CAUTI and undergoing workup for sepsis. Patient with h/o dysphagia with most recent MBS in 2016, which revealed silent aspiration of honey thick liquids and recommended puree solids only but with significant aspiration risk.  Subjective: alert, verbalizing some Assessment / Plan / Recommendation CHL IP CLINICAL IMPRESSIONS 07/09/2018 Clinical Impression Patient presents with a mild-moderate oral and  a moderate pharyngeal dysphagia. She exhibited delays in oral transit of pill in puree and delays in puree transit anterior to posterior in oral cavity. Pharyngeal phase consisted of delayed swallow initation to vallecular sinus with puree solids, and delays to pyriform sinus with tablet, thin and nectar thick liquid consistencies; residuals remaining in pyriform sinus, and lateral channel residuals with thin liquids and nectar thick liquids, and residuals in vallecular and pyriform sinus with puree solids. Patient had one instance of penetration with trace amount of thin liquids which occured during the swallow with large volume straw sip. When SLP presented a dry spoon and pressed down on tongue, patient initiated a swallow which did help in clearing majority of pharyngeal residuals. Patient did not sense penetrate, which remained above vocal cords. Cervical esophageal phase appeared [facility] with full transit of boluses.  SLP Visit Diagnosis Dysphagia, oropharyngeal phase (R13.12) Attention and concentration deficit following -- Frontal lobe and executive function deficit following -- Impact on safety and function Moderate aspiration risk   CHL IP TREATMENT RECOMMENDATION 07/09/2018 Treatment Recommendations Therapy as outlined in treatment plan below   Prognosis 07/09/2018 Prognosis for Safe Diet Advancement Fair Barriers to Reach Goals Severity of deficits;Time post onset Barriers/Prognosis Comment -- CHL IP DIET  RECOMMENDATION 07/09/2018 SLP Diet Recommendations Dysphagia 1 (Puree) solids;Thin liquid Liquid Administration via Cup;No straw Medication Administration Crushed with puree Compensations Minimize environmental distractions;Slow rate;Small sips/bites;Other (Comment) Postural Changes Seated upright at 90 degrees   CHL IP OTHER RECOMMENDATIONS 07/09/2018 Recommended Consults -- Oral Care Recommendations Oral care BID Other Recommendations --   CHL IP FOLLOW UP RECOMMENDATIONS 07/09/2018 Follow up Recommendations Home health SLP;24 hour supervision/assistance   CHL IP FREQUENCY AND DURATION 07/09/2018 Speech Therapy Frequency (ACUTE ONLY) min 2x/week Treatment Duration 1 week      CHL IP ORAL PHASE 07/09/2018 Oral Phase Impaired Oral - Pudding Teaspoon -- Oral - Pudding Cup -- Oral - Honey Teaspoon -- Oral - Honey Cup -- Oral - Nectar Teaspoon -- Oral - Nectar Cup -- Oral - Nectar Straw -- Oral - Thin Teaspoon -- Oral - Thin Cup -- Oral - Thin Straw -- Oral - Puree Weak lingual manipulation;Reduced posterior propulsion;Delayed oral transit Oral - Mech Soft -- Oral - Regular -- Oral - Multi-Consistency -- Oral - Pill Reduced posterior propulsion;Holding of bolus;Delayed oral transit Oral Phase - Comment --  CHL IP PHARYNGEAL PHASE 07/09/2018 Pharyngeal Phase Impaired Pharyngeal- Pudding Teaspoon -- Pharyngeal -- Pharyngeal- Pudding Cup -- Pharyngeal -- Pharyngeal- Honey Teaspoon -- Pharyngeal -- Pharyngeal- Honey Cup -- Pharyngeal -- Pharyngeal- Nectar Teaspoon -- Pharyngeal -- Pharyngeal- Nectar Cup Delayed swallow initiation-pyriform sinuses;Reduced pharyngeal peristalsis;Pharyngeal residue - valleculae;Pharyngeal residue - pyriform;Lateral channel residue Pharyngeal -- Pharyngeal- Nectar Straw -- Pharyngeal -- Pharyngeal- Thin Teaspoon -- Pharyngeal -- Pharyngeal- Thin Cup Delayed swallow initiation-pyriform sinuses;Pharyngeal residue - pyriform;Lateral channel residue Pharyngeal -- Pharyngeal- Thin Straw  Penetration/Aspiration during swallow;Delayed swallow initiation-pyriform sinuses;Pharyngeal residue - pyriform;Lateral channel residue Pharyngeal Material enters airway, remains ABOVE vocal cords and not ejected out Pharyngeal- Puree Delayed swallow initiation-vallecula;Pharyngeal residue - valleculae;Pharyngeal residue - pyriform;Other (Comment) Pharyngeal -- Pharyngeal- Mechanical Soft -- Pharyngeal -- Pharyngeal- Regular -- Pharyngeal -- Pharyngeal- Multi-consistency -- Pharyngeal -- Pharyngeal- Pill Delayed swallow initiation-pyriform sinuses;Pharyngeal residue - pyriform Pharyngeal -- Pharyngeal Comment --  CHL IP CERVICAL ESOPHAGEAL PHASE 07/09/2018 Cervical Esophageal Phase WFL Pudding Teaspoon -- Pudding Cup -- Honey Teaspoon -- Honey Cup -- Nectar Teaspoon -- Nectar Cup -- Nectar Straw -- Thin Teaspoon -- Thin Cup -- Thin Straw --  Puree -- Mechanical Soft -- Regular -- Multi-consistency -- Pill -- Cervical Esophageal Comment -- Dannial Monarch 07/09/2018, 10:13 AM    Sonia Baller, MA, CCC-SLP Speech Therapy [facility]            Scheduled Meds: . enoxaparin (LOVENOX) injection  40 mg Subcutaneous QHS  . insulin aspart  0-15 Units Subcutaneous TID WC  . insulin detemir  12 Units Subcutaneous Daily    Admission status: Inpatient: Based on patients clinical presentation and evaluation of above clinical data, I have made determination that patient meets Inpatient criteria at this time.  Time spent: 20 min  [facility] Pager [phone]. If 7PM-7AM, please contact night-coverage at [email or web address], Office  [phone]  password [place]  07/09/2018, 5:27 PM  LOS: 3 days

## 2018-07-09 NOTE — Care Management Important Message (Signed)
Important Message  Patient Details IM Letter given to Servando Snare SW to present to the Patient Name: [name] MRN: [id number] Date of Birth: 05-27-1940   Medicare Important Message Given:  Yes    Kerin Salen 07/09/2018, 10:27 AM

## 2018-07-09 NOTE — Progress Notes (Signed)
Inpatient Diabetes Program Recommendations  AACE/ADA: New Consensus Statement on Inpatient Glycemic Control (2015)  Target Ranges:  Prepandial:   less than 140 mg/dL      Peak postprandial:   less than 180 mg/dL (1-2 hours)      Critically ill patients:  140 - 180 mg/dL   Results for SHELBEE, APGAR (MRN [id number]) as of 07/09/2018 11:12  Ref. Range 07/08/2018 07:14 07/08/2018 11:39 07/08/2018 16:22 07/08/2018 21:01  Glucose-Capillary Latest Ref Range: 70 - 99 mg/dL 191 (H)  2 units NOVOLOG  209 (H)  2 units NOVOLOG  258 (H)  5 units NOVOLOG  246 (H)   Results for SONA, NATIONS (MRN [id number]) as of 07/09/2018 11:12  Ref. Range 07/09/2018 07:42 07/09/2018 11:10  Glucose-Capillary Latest Ref Range: 70 - 99 mg/dL 245 (H)  5 units NOVOLOG  204 (H)     Admit with: UTI/ Sepsis/ AKI  History: DM, Advanced Dementia, Bedbound  Home DM Meds: Metformin 1000 mg AM/ 500 mg Lunch/ 500 mg Dinner  Current Orders: Novolog Moderate Correction Scale/ SSI (0-15 units) TID AC     MD- Note patient with elevated CBGs in the AM the last 2 days.  Also having elevations throughout the day.  Per record review, patient has taken Levemir insulin in the past at home.  Please consider starting Levemir 12 units Daily (0.15 units/kg dosing based on weight of 85kg)--Please start Levemir this AM    --Will follow patient during hospitalization--  Wyn Quaker RN, MSN, CDE Diabetes Coordinator Inpatient Glycemic Control Team Team Pager: [phone] (8a-5p)

## 2018-07-09 NOTE — Progress Notes (Signed)
Modified Barium Swallow Progress Note  Patient Details  Name: [name] MRN: [id number] Date of Birth: 21-Jan-1941  Today's Date: 07/09/2018  Modified Barium Swallow completed.  Full report located under Chart Review in the Imaging Section.  Brief recommendations include the following:  Clinical Impression  Patient presents with a mild-moderate oral and a moderate pharyngeal dysphagia. She exhibited delays in oral transit of pill in puree and delays in puree transit anterior to posterior in oral cavity. Pharyngeal phase consisted of delayed swallow initation to vallecular sinus with puree solids, and delays to pyriform sinus with tablet, thin and nectar thick liquid consistencies; residuals remaining in pyriform sinus, and lateral channel residuals with thin liquids and nectar thick liquids, and residuals in vallecular and pyriform sinus with puree solids. Patient had one instance of penetration with trace amount of thin liquids which occured during the swallow with large volume straw sip. When SLP presented a dry spoon and pressed down on tongue, patient initiated a swallow which did help in clearing majority of pharyngeal residuals. Patient did not sense penetrate, which remained above vocal cords. Cervical esophageal phase appeared [facility] with full transit of boluses.    Swallow Evaluation Recommendations       SLP Diet Recommendations: Dysphagia 1 (Puree) solids;Thin liquid   Liquid Administration via: Cup;No straw   Medication Administration: Crushed with puree   Supervision: Full assist for feeding;Staff to assist with self feeding;Full supervision/cueing for compensatory strategies   Compensations: Minimize environmental distractions;Slow rate;Small sips/bites;Other (Comment)(dry spoon to initiate swallow)   Postural Changes: Seated upright at 90 degrees   Oral Care Recommendations: Oral care BID        Nadara Mode Tarrell 07/09/2018,10:15 AM    Sonia Baller, MA,  CCC-SLP Speech Therapy [facility]

## 2018-07-09 NOTE — Consult Note (Addendum)
[facility] Nurse wound consult note Reason for Consult: Consult requested for sacrum/buttocks wound.  Pt is familiar to [facility] team from previous admission on 3/31. Wound type: Stage 3 pressure injury to area near sacrum/right buttock.  Surrounded by pink dry scars from previous wounds which have healed.  Pressure Injury POA: Yes Measurement: 1.5X.8X.2cm Wound bed: red and moist Drainage (amount, consistency, odor) no odor or drainage Dressing procedure/placement/frequency: Foam dressing to protect and promote healing. It will be difficult to keep wound from becoming soiled related to close proximity to rectum and patient is frequently incontinent of stool. Please re-consult if further assistance is needed.  Thank-you,  Julien Girt MSN, [place], [place], [place], [place]

## 2018-07-10 LAB — COMPREHENSIVE METABOLIC PANEL
ALT: 18 U/L (ref 0–44)
AST: 16 U/L (ref 15–41)
Albumin: 3 g/dL — ABNORMAL LOW (ref 3.5–5.0)
Alkaline Phosphatase: 60 U/L (ref 38–126)
Anion gap: 8 (ref 5–15)
BUN: 33 mg/dL — ABNORMAL HIGH (ref 8–23)
CO2: 19 mmol/L — ABNORMAL LOW (ref 22–32)
Calcium: 9 mg/dL (ref 8.9–10.3)
Chloride: 117 mmol/L — ABNORMAL HIGH (ref 98–111)
Creatinine, Ser: 0.8 mg/dL (ref 0.44–1.00)
GFR calc Af Amer: >60 mL/min (ref >60)
GFR calc non Af Amer: >60 mL/min (ref >60)
Glucose, Bld: 213 mg/dL — ABNORMAL HIGH (ref 70–99)
Potassium: 3.2 mmol/L — ABNORMAL LOW (ref 3.5–5.1)
Sodium: 144 mmol/L (ref 135–145)
Total Bilirubin: 0.7 mg/dL (ref 0.3–1.2)
Total Protein: 6.3 g/dL — ABNORMAL LOW (ref 6.5–8.1)

## 2018-07-10 LAB — CBC
HCT: 35.2 % — ABNORMAL LOW (ref 36.0–46.0)
Hemoglobin: 10.2 g/dL — ABNORMAL LOW (ref 12.0–15.0)
MCH: 25.6 pg — ABNORMAL LOW (ref 26.0–34.0)
MCHC: 29 g/dL — ABNORMAL LOW (ref 30.0–36.0)
MCV: 88.4 fL (ref 80.0–100.0)
Platelets: 283 10*3/uL (ref 150–400)
RBC: 3.98 MIL/uL (ref 3.87–5.11)
RDW: 16.5 % — ABNORMAL HIGH (ref 11.5–15.5)
WBC: 8.3 10*3/uL (ref 4.0–10.5)
nRBC: 0 % (ref 0.0–0.2)

## 2018-07-10 LAB — GLUCOSE, CAPILLARY
Glucose-Capillary: 199 mg/dL — ABNORMAL HIGH (ref 70–99)
Glucose-Capillary: 190 mg/dL — ABNORMAL HIGH (ref 70–99)
Glucose-Capillary: 136 mg/dL — ABNORMAL HIGH (ref 70–99)
Glucose-Capillary: 113 mg/dL — ABNORMAL HIGH (ref 70–99)

## 2018-07-10 MED ORDER — POTASSIUM CHLORIDE CRYS ER 20 MEQ PO TBCR
40.0000 meq | EXTENDED_RELEASE_TABLET | Freq: Once | ORAL | Status: AC
  Administered 2018-07-10: 40 meq via ORAL
  Filled 2018-07-10: qty 2

## 2018-07-10 NOTE — Progress Notes (Signed)
[facility]  PROGRESS NOTE  [name] VQQ:[id number] DOB: 26-May-1940 DOA: 07/06/2018 PCP: [name], MD   Brief HPI:   78 year old female with history of advanced dementia, diabetes mellitus type 2, hypertension, recurrent UTIs, dysphagia, indwelling Foley catheter at baseline, who presented with worsening confusion and foul-smelling urine.  Patient has been treated for CAUTI.    Subjective   Patient seen and examined, denies any complaints.  No chest pain or shortness of breath.   Assessment/Plan:     1. Severe sepsis-sepsis physiology has resolved.  Secondary to CAUTI, started on ceftriaxone.  2. CAUTI-patient has chronic Foley catheter, urine culture growing multiple species.  Will continue with Ceftriaxone.  3. Coagulase-negative staph bacteremia-blood cultures x1 bottle, anaerobic growing coagulase-negative staph, methicillin-resistant.  Likely contaminant.  Would not treat at this time.  Follow final culture and sensitivity results.  4. Metabolic encephalopathy-patient has underlying dementia, likely could have worsened from above.  Appears to be back to baseline.  5. Acute kidney injury-patient baseline creatinine 0.7, she presented with creatinine of 1.34 on admission.  After IV fluids creatinine is down to 1.09  6. Hypernatremia-patient presented with mild hypernatremia, likely from poor p.o. intake.  Started on D5W, sodium is slowly improving.  Sodium is down to 144.  7. Dysphagia-speech therapy was consulted for dysphagia, patient started on dysphagia 1 diet.  8. Hypertension-blood pressures controlled, amlodipine and losartan are on hold.  Will monitor patient blood pressure.  9. Diabetes mellitus type 2-continue sliding scale insulin with NovoLog.  Restarted on Levemir 12 units subcu daily.    10. Hypokalemia-we will replace potassium and check BMP in a.m.    CBG: Recent Labs  Lab 07/09/18 1110 07/09/18 1625 07/09/18 2018 07/10/18 0729  07/10/18 1239  GLUCAP 204* 166* 161* 199* 190*    CBC: Recent Labs  Lab 07/06/18 1811 07/07/18 0706 07/08/18 0556 07/10/18 0859  WBC 13.7* 11.9* 9.7 8.3  NEUTROABS 11.3*  --   --   --   HGB 11.4* 10.5* 9.7* 10.2*  HCT 37.8 36.2 32.7* 35.2*  MCV 85.1 88.3 84.1 88.4  PLT 348 288 212 433    Basic Metabolic Panel: Recent Labs  Lab 07/07/18 0706 07/07/18 1640 07/08/18 0556 07/09/18 1814 07/10/18 0859  NA 154* 153* 151* 148* 144  K 3.8 4.0 3.7 3.2* 3.2*  CL 122* 124* 123* 120* 117*  CO2 24 20* 19* 19* 19*  GLUCOSE 110* 244* 228* 174* 213*  BUN 65* 61* 56* 42* 33*  CREATININE 1.21* 1.23* 1.09* 0.98 0.80  CALCIUM 9.8 9.4 9.3 9.2 9.0     DVT prophylaxis: Lovenox  Code Status: DNR  Family Communication: No family at bedside  Disposition Plan: To be decided     Consultants:    Procedures:     Antibiotics:   Anti-infectives (From admission, onward)   Start     Dose/Rate Route Frequency Ordered Stop   07/07/18 1800  cefTRIAXone (ROCEPHIN) 1 g in sodium chloride 0.9 % 100 mL IVPB     1 g 200 mL/hr over 30 Minutes Intravenous Every 24 hours 07/06/18 2034     07/07/18 1600  cefTRIAXone (ROCEPHIN) 1 g in sodium chloride 0.9 % 100 mL IVPB  Status:  Discontinued     1 g 200 mL/hr over 30 Minutes Intravenous Every 24 hours 07/06/18 2033 07/06/18 2034   07/06/18 1800  cefTRIAXone (ROCEPHIN) 1 g in sodium chloride 0.9 % 100 mL IVPB     1 g 200 mL/hr over 30  Minutes Intravenous  Once 07/06/18 1753 07/06/18 2011       Objective   Vitals:   07/09/18 1331 07/09/18 2016 07/10/18 0517 07/10/18 1420  BP: 138/74 109/63 (!) 144/98 122/90  Pulse: 97 86 93 91  Resp: 14 (!) 24 (!) 24   Temp: 98.1 F (36.7 C) 98.6 F (37 C) 98.5 F (36.9 C) 98.6 F (37 C)  TempSrc: Axillary   Oral  SpO2: 99% 97% 99% 99%  Weight:      Height:        Intake/Output Summary (Last 24 hours) at 07/10/2018 1635 Last data filed at 07/10/2018 0830 Gross per 24 hour  Intake 1234.15  ml  Output 500 ml  Net 734.15 ml   Filed Weights   07/06/18 1813  Weight: 85.3 kg     Physical Examination:   General-appears in no acute distress Heart-S1-S2, regular, no murmur auscultated Lungs-clear to auscultation bilaterally, no wheezing or crackles auscultated Abdomen-soft, nontender, no organomegaly Extremities-no edema in the lower extremities Neuro-alert, oriented x 1, no focal deficit noted    Data Reviewed: I have personally reviewed following labs and imaging studies   Recent Results (from the past 240 hour(s))  Blood culture (routine x 2)     Status: None (Preliminary result)   Collection Time: 07/06/18  6:06 PM  Result Value Ref Range Status   Specimen Description BLOOD RIGHT HAND  Final   Special Requests   Final    BOTTLES DRAWN AEROBIC AND ANAEROBIC Blood Culture results may not be optimal due to an inadequate volume of blood received in culture bottles   Culture   Final    NO GROWTH 4 DAYS Performed at [facility], [address] [place], [place] [address]    Report Status PENDING  Incomplete  Blood culture (routine x 2)     Status: Abnormal   Collection Time: 07/06/18  6:13 PM  Result Value Ref Range Status   Specimen Description BLOOD LEFT HAND  Final   Special Requests   Final    BOTTLES DRAWN AEROBIC AND ANAEROBIC Blood Culture adequate volume   Culture  Setup Time   Final    ANAEROBIC BOTTLE ONLY GRAM POSITIVE COCCI IN PAIRS RESULT CALLED TO, READ BACK BY AND VERIFIED WITH: BETH GREENE @ 6144 ON 07/08/18 BY ROBINSON Z.     Culture (A)  Final    STAPHYLOCOCCUS SPECIES (COAGULASE NEGATIVE) THE SIGNIFICANCE OF ISOLATING THIS ORGANISM FROM A SINGLE SET OF BLOOD CULTURES WHEN MULTIPLE SETS ARE DRAWN IS UNCERTAIN. PLEASE NOTIFY THE MICROBIOLOGY DEPARTMENT WITHIN ONE WEEK IF SPECIATION AND SENSITIVITIES ARE REQUIRED. Performed at [facility], [address], [place], [place] [address]    Report Status 07/10/2018 FINAL  Final  Blood  Culture ID Panel (Reflexed)     Status: Abnormal   Collection Time: 07/06/18  6:13 PM  Result Value Ref Range Status   Enterococcus species NOT DETECTED NOT DETECTED Final   Listeria monocytogenes NOT DETECTED NOT DETECTED Final   Staphylococcus species DETECTED (A) NOT DETECTED Final    Comment: Methicillin (oxacillin) resistant coagulase negative staphylococcus. Possible blood culture contaminant (unless isolated from more than one blood culture draw or clinical case suggests pathogenicity). No antibiotic treatment is indicated for blood  culture contaminants. CRITICAL RESULT CALLED TO, READ BACK BY AND VERIFIED WITH: BETH GREENE @ 0056 ON 07/08/18 BY ROBINSON Z.     Staphylococcus aureus (BCID) NOT DETECTED NOT DETECTED Final   Methicillin resistance DETECTED (  A) NOT DETECTED Final    Comment: CRITICAL RESULT CALLED TO, READ BACK BY AND VERIFIED WITH: BETH GREENE @ 0056 ON 07/08/18 BY ROBINSON Z.     Streptococcus species NOT DETECTED NOT DETECTED Final   Streptococcus agalactiae NOT DETECTED NOT DETECTED Final   Streptococcus pneumoniae NOT DETECTED NOT DETECTED Final   Streptococcus pyogenes NOT DETECTED NOT DETECTED Final   Acinetobacter baumannii NOT DETECTED NOT DETECTED Final   Enterobacteriaceae species NOT DETECTED NOT DETECTED Final   Enterobacter cloacae complex NOT DETECTED NOT DETECTED Final   Escherichia coli NOT DETECTED NOT DETECTED Final   Klebsiella oxytoca NOT DETECTED NOT DETECTED Final   Klebsiella pneumoniae NOT DETECTED NOT DETECTED Final   Proteus species NOT DETECTED NOT DETECTED Final   Serratia marcescens NOT DETECTED NOT DETECTED Final   Haemophilus influenzae NOT DETECTED NOT DETECTED Final   Neisseria meningitidis NOT DETECTED NOT DETECTED Final   Pseudomonas aeruginosa NOT DETECTED NOT DETECTED Final   Candida albicans NOT DETECTED NOT DETECTED Final   Candida glabrata NOT DETECTED NOT DETECTED Final   Candida krusei NOT DETECTED NOT DETECTED Final    Candida parapsilosis NOT DETECTED NOT DETECTED Final   Candida tropicalis NOT DETECTED NOT DETECTED Final    Comment: Performed at [facility], [place] [address] [place], [place] [address]  Urine culture     Status: Abnormal   Collection Time: 07/06/18  7:43 PM  Result Value Ref Range Status   Specimen Description   Final    URINE, CLEAN CATCH Performed at [facility], [place] [address] [place], [place] [address]    Special Requests   Final    NONE Performed at [facility], [place] [address], [place], [place] [address]    Culture (A)  Final    >=100,000 COLONIES/mL MULTIPLE SPECIES PRESENT, SUGGEST RECOLLECTION   Report Status 07/08/2018 FINAL  Final  SARS Coronavirus 2 (CEPHEID - Performed in [place] hospital lab), Hosp Order     Status: None   Collection Time: 07/06/18  8:11 PM  Result Value Ref Range Status   SARS Coronavirus 2 NEGATIVE NEGATIVE Final    Comment: (NOTE) If result is NEGATIVE SARS-CoV-2 target nucleic acids are NOT DETECTED. The SARS-CoV-2 RNA is generally detectable in upper and lower  respiratory specimens during the acute phase of infection. The lowest  concentration of SARS-CoV-2 viral copies this assay can detect is 250  copies / mL. A negative result does not preclude SARS-CoV-2 infection  and should not be used as the sole basis for treatment or other  patient management decisions.  A negative result may occur with  improper specimen collection / handling, submission of specimen other  than nasopharyngeal swab, presence of viral mutation(s) within the  areas targeted by this assay, and inadequate number of viral copies  (<250 copies / mL). A negative result must be combined with clinical  observations, patient history, and epidemiological information. If result is POSITIVE SARS-CoV-2 target nucleic acids are DETECTED. The SARS-CoV-2 RNA is generally detectable in upper and lower  respiratory specimens dur ing  the acute phase of infection.  Positive  results are indicative of active infection with SARS-CoV-2.  Clinical  correlation with patient history and other diagnostic information is  necessary to determine patient infection status.  Positive results do  not rule out bacterial infection or co-infection with other viruses. If result is PRESUMPTIVE POSTIVE SARS-CoV-2 nucleic acids MAY BE PRESENT.   A presumptive positive result  was obtained on the submitted specimen  and confirmed on repeat testing.  While 2019 novel coronavirus  (SARS-CoV-2) nucleic acids may be present in the submitted sample  additional confirmatory testing may be necessary for epidemiological  and / or clinical management purposes  to differentiate between  SARS-CoV-2 and other Sarbecovirus currently known to infect humans.  If clinically indicated additional testing with an alternate test  methodology [phone]) is advised. The SARS-CoV-2 RNA is generally  detectable in upper and lower respiratory sp ecimens during the acute  phase of infection. The expected result is Negative. Fact Sheet for Patients:  StrictlyIdeas.no Fact Sheet for Healthcare Providers: BankingDealers.co.za This test is not yet approved or cleared by the [place] FDA and has been authorized for detection and/or diagnosis of SARS-CoV-2 by FDA under an Emergency Use Authorization (EUA).  This EUA will remain in effect (meaning this test can be used) for the duration of the COVID-19 declaration under Section 564(b)(1) of the Act, 21 U.S.C. section 360bbb-3(b)(1), unless the authorization is terminated or revoked sooner. Performed at [facility], [place] [address] [place], [place] [address]      Liver Function Tests: Recent Labs  Lab 07/06/18 1811 07/10/18 0859  AST 15 16  ALT 18 18  ALKPHOS 72 60  BILITOT 0.8 0.7  PROT 7.6 6.3*  ALBUMIN 3.4* 3.0*   No results for  input(s): LIPASE, AMYLASE in the last 168 hours. No results for input(s): AMMONIA in the last 168 hours.  Cardiac Enzymes: Recent Labs  Lab 07/06/18 1811  TROPONINI <0.03   BNP (last 3 results) No results for input(s): BNP in the last 8760 hours.  ProBNP (last 3 results) No results for input(s): PROBNP in the last 8760 hours.    Studies: Dg Swallowing Func-speech Pathology  Result Date: 07/09/2018 Objective Swallowing Evaluation: Type of Study: MBS-Modified Barium Swallow Study  Patient Details Name: HOORAIN KOZAKIEWICZ MRN: [id number] Date of Birth: 1940/02/26 Today's Date: 07/09/2018 Time: SLP Start Time (ACUTE ONLY): 0845 -SLP Stop Time (ACUTE ONLY): 0905 SLP Time Calculation (min) (ACUTE ONLY): 20 min Past Medical History: Past Medical History: Diagnosis Date . Anxiety  . Dementia ([place])  . Depression  . Diabetes mellitus  . Dyspnea  . GERD (gastroesophageal reflux disease)  . Headache(784.0)  . HTN (hypertension)   dr Johnsie Cancel . Hypercholesteremia   DENIES . Osteoarthritis  . PONV (postoperative nausea and vomiting)  . RLS (restless legs syndrome)  . Stress  . UTI (urinary tract infection) 05/27/2014 Past Surgical History: Past Surgical History: Procedure Laterality Date . ABDOMINAL HYSTERECTOMY   . COLONOSCOPY   . JOINT REPLACEMENT   . left forearm fracture with ORIF Left  . LUMBAR LAMINECTOMY/DECOMPRESSION MICRODISCECTOMY N/A 04/18/2012  Procedure: LUMBAR LAMINECTOMY/DECOMPRESSION MICRODISCECTOMY 3 LEVELS;  Surgeon: Winfield Cunas, MD;  Location: [facility];  Service: Neurosurgery;  Laterality: N/A;  Lumbar three-four, lumbar four-five, lumbar five-sacral one decompression. Synovial cyst resection lumbar four-five . ORIF PERIPROSTHETIC FRACTURE Right 07/06/2016  Procedure: OPEN REDUCTION INTERNAL FIXATION (ORIF) PERIPROSTHETIC FRACTURE;  Surgeon: Gaynelle Arabian, MD;  Location: [facility];  Service: Orthopedics;  Laterality: Right; . REPLACEMENT TOTAL KNEE Right  . WISDOM TOOTH EXTRACTION   HPI: Patient  is a 78 y.o. female with PMH: advanced dementia, DM2, HTN, recurrent UTI's, dysphagia, indwelling foley catheter at baseline, who presented with worsening confusion and foul smelling urine. She is being treated for CAUTI and undergoing workup for sepsis. Patient with h/o dysphagia with most recent MBS in 2016, which  revealed silent aspiration of honey thick liquids and recommended puree solids only but with significant aspiration risk.  Subjective: alert, verbalizing some Assessment / Plan / Recommendation CHL IP CLINICAL IMPRESSIONS 07/09/2018 Clinical Impression Patient presents with a mild-moderate oral and a moderate pharyngeal dysphagia. She exhibited delays in oral transit of pill in puree and delays in puree transit anterior to posterior in oral cavity. Pharyngeal phase consisted of delayed swallow initation to vallecular sinus with puree solids, and delays to pyriform sinus with tablet, thin and nectar thick liquid consistencies; residuals remaining in pyriform sinus, and lateral channel residuals with thin liquids and nectar thick liquids, and residuals in vallecular and pyriform sinus with puree solids. Patient had one instance of penetration with trace amount of thin liquids which occured during the swallow with large volume straw sip. When SLP presented a dry spoon and pressed down on tongue, patient initiated a swallow which did help in clearing majority of pharyngeal residuals. Patient did not sense penetrate, which remained above vocal cords. Cervical esophageal phase appeared [facility] with full transit of boluses.  SLP Visit Diagnosis Dysphagia, oropharyngeal phase (R13.12) Attention and concentration deficit following -- Frontal lobe and executive function deficit following -- Impact on safety and function Moderate aspiration risk   CHL IP TREATMENT RECOMMENDATION 07/09/2018 Treatment Recommendations Therapy as outlined in treatment plan below   Prognosis 07/09/2018 Prognosis for Safe Diet Advancement Fair  Barriers to Reach Goals Severity of deficits;Time post onset Barriers/Prognosis Comment -- CHL IP DIET RECOMMENDATION 07/09/2018 SLP Diet Recommendations Dysphagia 1 (Puree) solids;Thin liquid Liquid Administration via Cup;No straw Medication Administration Crushed with puree Compensations Minimize environmental distractions;Slow rate;Small sips/bites;Other (Comment) Postural Changes Seated upright at 90 degrees   CHL IP OTHER RECOMMENDATIONS 07/09/2018 Recommended Consults -- Oral Care Recommendations Oral care BID Other Recommendations --   CHL IP FOLLOW UP RECOMMENDATIONS 07/09/2018 Follow up Recommendations Home health SLP;24 hour supervision/assistance   CHL IP FREQUENCY AND DURATION 07/09/2018 Speech Therapy Frequency (ACUTE ONLY) min 2x/week Treatment Duration 1 week      CHL IP ORAL PHASE 07/09/2018 Oral Phase Impaired Oral - Pudding Teaspoon -- Oral - Pudding Cup -- Oral - Honey Teaspoon -- Oral - Honey Cup -- Oral - Nectar Teaspoon -- Oral - Nectar Cup -- Oral - Nectar Straw -- Oral - Thin Teaspoon -- Oral - Thin Cup -- Oral - Thin Straw -- Oral - Puree Weak lingual manipulation;Reduced posterior propulsion;Delayed oral transit Oral - Mech Soft -- Oral - Regular -- Oral - Multi-Consistency -- Oral - Pill Reduced posterior propulsion;Holding of bolus;Delayed oral transit Oral Phase - Comment --  CHL IP PHARYNGEAL PHASE 07/09/2018 Pharyngeal Phase Impaired Pharyngeal- Pudding Teaspoon -- Pharyngeal -- Pharyngeal- Pudding Cup -- Pharyngeal -- Pharyngeal- Honey Teaspoon -- Pharyngeal -- Pharyngeal- Honey Cup -- Pharyngeal -- Pharyngeal- Nectar Teaspoon -- Pharyngeal -- Pharyngeal- Nectar Cup Delayed swallow initiation-pyriform sinuses;Reduced pharyngeal peristalsis;Pharyngeal residue - valleculae;Pharyngeal residue - pyriform;Lateral channel residue Pharyngeal -- Pharyngeal- Nectar Straw -- Pharyngeal -- Pharyngeal- Thin Teaspoon -- Pharyngeal -- Pharyngeal- Thin Cup Delayed swallow initiation-pyriform  sinuses;Pharyngeal residue - pyriform;Lateral channel residue Pharyngeal -- Pharyngeal- Thin Straw Penetration/Aspiration during swallow;Delayed swallow initiation-pyriform sinuses;Pharyngeal residue - pyriform;Lateral channel residue Pharyngeal Material enters airway, remains ABOVE vocal cords and not ejected out Pharyngeal- Puree Delayed swallow initiation-vallecula;Pharyngeal residue - valleculae;Pharyngeal residue - pyriform;Other (Comment) Pharyngeal -- Pharyngeal- Mechanical Soft -- Pharyngeal -- Pharyngeal- Regular -- Pharyngeal -- Pharyngeal- Multi-consistency -- Pharyngeal -- Pharyngeal- Pill Delayed swallow initiation-pyriform sinuses;Pharyngeal residue - pyriform Pharyngeal -- Pharyngeal Comment --  CHL IP CERVICAL ESOPHAGEAL PHASE 07/09/2018 Cervical Esophageal Phase WFL Pudding Teaspoon -- Pudding Cup -- Honey Teaspoon -- Honey Cup -- Nectar Teaspoon -- Nectar Cup -- Nectar Straw -- Thin Teaspoon -- Thin Cup -- Thin Straw -- Puree -- Mechanical Soft -- Regular -- Multi-consistency -- Pill -- Cervical Esophageal Comment -- Dannial Monarch 07/09/2018, 10:13 AM    Sonia Baller, MA, CCC-SLP Speech Therapy [facility]            Scheduled Meds: . enoxaparin (LOVENOX) injection  40 mg Subcutaneous QHS  . insulin aspart  0-15 Units Subcutaneous TID WC  . insulin detemir  12 Units Subcutaneous QHS    Admission status: Inpatient: Based on patients clinical presentation and evaluation of above clinical data, I have made determination that patient meets Inpatient criteria at this time.  Time spent: 20 min  [facility] Pager [phone]. If 7PM-7AM, please contact night-coverage at [email or web address], Office  [phone]  password [email or web address]  07/10/2018, 4:35 PM  LOS: 4 days

## 2018-07-11 LAB — BASIC METABOLIC PANEL
Anion gap: 9 (ref 5–15)
BUN: 30 mg/dL — ABNORMAL HIGH (ref 8–23)
CO2: 16 mmol/L — ABNORMAL LOW (ref 22–32)
Calcium: 8.9 mg/dL (ref 8.9–10.3)
Chloride: 119 mmol/L — ABNORMAL HIGH (ref 98–111)
Creatinine, Ser: 0.73 mg/dL (ref 0.44–1.00)
GFR calc Af Amer: >60 mL/min (ref >60)
GFR calc non Af Amer: >60 mL/min (ref >60)
Glucose, Bld: 116 mg/dL — ABNORMAL HIGH (ref 70–99)
Potassium: 4.1 mmol/L (ref 3.5–5.1)
Sodium: 144 mmol/L (ref 135–145)

## 2018-07-11 LAB — CULTURE, BLOOD (ROUTINE X 2): Culture: NO GROWTH

## 2018-07-11 LAB — GLUCOSE, CAPILLARY
Glucose-Capillary: 113 mg/dL — ABNORMAL HIGH (ref 70–99)
Glucose-Capillary: 181 mg/dL — ABNORMAL HIGH (ref 70–99)
Glucose-Capillary: 170 mg/dL — ABNORMAL HIGH (ref 70–99)

## 2018-07-11 NOTE — Progress Notes (Signed)
PHARMACY - PHYSICIAN COMMUNICATION CRITICAL VALUE ALERT - BLOOD CULTURE IDENTIFICATION (BCID)  [name] is an 78 y.o. female who presented to [facility] on 07/06/2018 with a chief complaint of UTI  Assessment:  Patient with GPR on gram statin but since only one set, lab will NOT be doing BCID.  Prior CONS noted in BCID below.   (include suspected source if known)  Name of physician (or Provider) Contacted: Secure Chat night shift NP, and this note  Current antibiotics: Ceftriaxone  Changes to prescribed antibiotics recommended:  Patient is on recommended antibiotics - No changes needed  Results for orders placed or performed during the hospital encounter of 07/06/18  Blood Culture ID Panel (Reflexed) (Collected: 07/06/2018  6:13 PM)  Result Value Ref Range   Enterococcus species NOT DETECTED NOT DETECTED   Listeria monocytogenes NOT DETECTED NOT DETECTED   Staphylococcus species DETECTED (A) NOT DETECTED   Staphylococcus aureus (BCID) NOT DETECTED NOT DETECTED   Methicillin resistance DETECTED (A) NOT DETECTED   Streptococcus species NOT DETECTED NOT DETECTED   Streptococcus agalactiae NOT DETECTED NOT DETECTED   Streptococcus pneumoniae NOT DETECTED NOT DETECTED   Streptococcus pyogenes NOT DETECTED NOT DETECTED   Acinetobacter baumannii NOT DETECTED NOT DETECTED   Enterobacteriaceae species NOT DETECTED NOT DETECTED   Enterobacter cloacae complex NOT DETECTED NOT DETECTED   Escherichia coli NOT DETECTED NOT DETECTED   Klebsiella oxytoca NOT DETECTED NOT DETECTED   Klebsiella pneumoniae NOT DETECTED NOT DETECTED   Proteus species NOT DETECTED NOT DETECTED   Serratia marcescens NOT DETECTED NOT DETECTED   Haemophilus influenzae NOT DETECTED NOT DETECTED   Neisseria meningitidis NOT DETECTED NOT DETECTED   Pseudomonas aeruginosa NOT DETECTED NOT DETECTED   Candida albicans NOT DETECTED NOT DETECTED   Candida glabrata NOT DETECTED NOT DETECTED   Candida krusei NOT  DETECTED NOT DETECTED   Candida parapsilosis NOT DETECTED NOT DETECTED   Candida tropicalis NOT DETECTED NOT DETECTED    [name] 07/05/2018  4:17 AM

## 2018-07-11 NOTE — Progress Notes (Signed)
Spoke with pts daughter on the phone about pt missing a wedding band and bed sheet from home. Pts chart reviewed and there was no ring or bed sheets entered on admission belongings. Security called to check if it had been sent to them and was not. Unable to locate ring and bed sheet; per admission documentation they were not present when pt was brought to the unit.

## 2018-07-11 NOTE — Progress Notes (Signed)
  Speech Language Pathology Treatment: Dysphagia  Patient Details Name: [name] MRN: [id number] DOB: 09/08/1940 Today's Date: 07/14/2018 Time: 0017-4944 SLP Time Calculation (min) (ACUTE ONLY): 10 min  Assessment / Plan / Recommendation Clinical Impression  Pt suspected to be at baseline at this time.  She is able to speak to SLP with clear articulation - hearing loss impairs her receptive skills.  Nurse tech who worked with her previously advised that she is much more alert than when she saw her Saturday.    Pt willing to consume intake - and trial of 3 ounce Yale water test complete although without success due to pt needing break.  Pt able to self feed solids, icecream and liquids.  No indication of aspiration or dysphagia with all intake.  Recommend advancing to soft - finger foods also with thin.  Advised pt to recommendation for advancement however with her cognitive she likely does not fully comprehend.   SLP provided new swallow precaution sign as well as an information handout regarding dysphagia with dementia.   Sealed can of thickener with tape and placed in pt belongings bag.   No acute follow up indicated, anticipate pt may only require brief [place] SLP follow up for dysphagia management/family education.  Thanks!     HPI HPI: Patient is a 78 y.o. female with PMH: advanced dementia, DM2, HTN, recurrent UTI's, dysphagia, indwelling foley catheter at baseline, who presented with worsening confusion and foul smelling urine. She is being treated for CAUTI and undergoing workup for sepsis. Patient with h/o dysphagia with most recent MBS in 2016, which revealed silent aspiration of honey thick liquids and recommended puree solids only but with significant aspiration risk.      SLP Plan  All goals met       Recommendations  Diet recommendations: Dysphagia 3 (mechanical soft)(finger foods) Liquids provided via: Cup Medication Administration: Whole meds with puree Supervision:  Patient able to self feed Compensations: Slow rate;Minimize environmental distractions;Small sips/bites Postural Changes and/or Swallow Maneuvers: Upright 30-60 min after meal;Seated upright 90 degrees                Oral Care Recommendations: Oral care BID Follow up Recommendations: Home health SLP(per daughter, patient was receiving [facility] SLP services that started in April of 2020) SLP Visit Diagnosis: Dysphagia, unspecified (R13.10) Plan: All goals met       GO                Macario Golds 07/17/2018, 4:12 PM  Luanna Salk, [facility] SLP [place] Pager [phone] Office [phone]

## 2018-07-11 NOTE — Care Management Important Message (Signed)
Important Message  Patient Details IM Letter is given to Velva Harman Case Manager to present to the Patient Name: MAUDE HETTICH MRN: [id number] Date of Birth: [birth year]   Medicare Important Message Given:  Yes    Kerin Salen 07/18/2018, 11:12 AMImportant Message  Patient Details  Name: BERNEITA SANAGUSTIN MRN: [id number] Date of Birth: [birth year]   Medicare Important Message Given:  Yes    Kerin Salen 07/04/2018, 11:12 AM

## 2018-07-11 NOTE — Progress Notes (Signed)
PTAR called and scheduled to pick up pt and take home at 1830.

## 2018-07-12 NOTE — Progress Notes (Signed)
**  This note was entered on 07/12/2018 at 11:10am - the note could not be timed after discharge date and time**  The daughter Jamey Ripa called to follow up regarding her mothers missing wedding band.  I told Jamey Ripa that it looks like the nurse that was caring for her mother on yesterday had called security as well reviewed the chart to see that no ring or sheets were documented on admission.  I placed her on a brief hold in order to review the chart myself and also found the same - that there was no ring or sheets documented on the admission history.  I let the daughter know I would follow up with the emergency room to see if there was anything there and give her a call back.  I followed up with Irish Lack, Emergency Room director and she was unable to successfully find anything. I looked further in the chart at the patient's previous admissions and the last time I saw a ring documented on admission was on March 07, 2018.  I attempted to call the daughter back to discuss my findings however I didn't receive an answer and her voicemail was full.

## 2018-07-13 ENCOUNTER — Inpatient Hospital Stay (HOSPITAL_COMMUNITY)
Admission: EM | Admit: 2018-07-13 | Discharge: 2018-07-23 | DRG: 871 | Disposition: E | Payer: Medicare Other | Attending: Internal Medicine | Admitting: Internal Medicine

## 2018-07-13 ENCOUNTER — Emergency Department (HOSPITAL_COMMUNITY): Payer: Medicare Other

## 2018-07-13 ENCOUNTER — Encounter (HOSPITAL_COMMUNITY): Payer: Self-pay | Admitting: Emergency Medicine

## 2018-07-13 DIAGNOSIS — F329 Major depressive disorder, single episode, unspecified: Secondary | ICD-10-CM | POA: Diagnosis present

## 2018-07-13 DIAGNOSIS — G2581 Restless legs syndrome: Secondary | ICD-10-CM | POA: Diagnosis present

## 2018-07-13 DIAGNOSIS — G9341 Metabolic encephalopathy: Secondary | ICD-10-CM | POA: Diagnosis present

## 2018-07-13 DIAGNOSIS — Z7984 Long term (current) use of oral hypoglycemic drugs: Secondary | ICD-10-CM

## 2018-07-13 DIAGNOSIS — K810 Acute cholecystitis: Secondary | ICD-10-CM | POA: Diagnosis present

## 2018-07-13 DIAGNOSIS — R6520 Severe sepsis without septic shock: Secondary | ICD-10-CM | POA: Diagnosis present

## 2018-07-13 DIAGNOSIS — K219 Gastro-esophageal reflux disease without esophagitis: Secondary | ICD-10-CM | POA: Diagnosis present

## 2018-07-13 DIAGNOSIS — E872 Acidosis: Secondary | ICD-10-CM | POA: Diagnosis present

## 2018-07-13 DIAGNOSIS — E1110 Type 2 diabetes mellitus with ketoacidosis without coma: Secondary | ICD-10-CM | POA: Diagnosis present

## 2018-07-13 DIAGNOSIS — Z96651 Presence of right artificial knee joint: Secondary | ICD-10-CM | POA: Diagnosis present

## 2018-07-13 DIAGNOSIS — K8010 Calculus of gallbladder with chronic cholecystitis without obstruction: Secondary | ICD-10-CM

## 2018-07-13 DIAGNOSIS — I959 Hypotension, unspecified: Secondary | ICD-10-CM | POA: Diagnosis present

## 2018-07-13 DIAGNOSIS — E7800 Pure hypercholesterolemia, unspecified: Secondary | ICD-10-CM | POA: Diagnosis present

## 2018-07-13 DIAGNOSIS — Z515 Encounter for palliative care: Secondary | ICD-10-CM | POA: Diagnosis present

## 2018-07-13 DIAGNOSIS — N179 Acute kidney failure, unspecified: Secondary | ICD-10-CM | POA: Diagnosis present

## 2018-07-13 DIAGNOSIS — Z20828 Contact with and (suspected) exposure to other viral communicable diseases: Secondary | ICD-10-CM | POA: Diagnosis present

## 2018-07-13 DIAGNOSIS — I10 Essential (primary) hypertension: Secondary | ICD-10-CM | POA: Diagnosis present

## 2018-07-13 DIAGNOSIS — M1990 Unspecified osteoarthritis, unspecified site: Secondary | ICD-10-CM | POA: Diagnosis present

## 2018-07-13 DIAGNOSIS — F419 Anxiety disorder, unspecified: Secondary | ICD-10-CM | POA: Diagnosis present

## 2018-07-13 DIAGNOSIS — E870 Hyperosmolality and hypernatremia: Secondary | ICD-10-CM | POA: Diagnosis present

## 2018-07-13 DIAGNOSIS — R197 Diarrhea, unspecified: Secondary | ICD-10-CM | POA: Diagnosis not present

## 2018-07-13 DIAGNOSIS — Z66 Do not resuscitate: Secondary | ICD-10-CM | POA: Diagnosis present

## 2018-07-13 DIAGNOSIS — Z8249 Family history of ischemic heart disease and other diseases of the circulatory system: Secondary | ICD-10-CM

## 2018-07-13 DIAGNOSIS — J690 Pneumonitis due to inhalation of food and vomit: Secondary | ICD-10-CM | POA: Diagnosis present

## 2018-07-13 DIAGNOSIS — F0390 Unspecified dementia without behavioral disturbance: Secondary | ICD-10-CM | POA: Diagnosis present

## 2018-07-13 DIAGNOSIS — K8011 Calculus of gallbladder with chronic cholecystitis with obstruction: Secondary | ICD-10-CM

## 2018-07-13 DIAGNOSIS — Z79899 Other long term (current) drug therapy: Secondary | ICD-10-CM

## 2018-07-13 DIAGNOSIS — Z96622 Presence of left artificial elbow joint: Secondary | ICD-10-CM | POA: Diagnosis present

## 2018-07-13 DIAGNOSIS — Z91013 Allergy to seafood: Secondary | ICD-10-CM

## 2018-07-13 DIAGNOSIS — N3000 Acute cystitis without hematuria: Secondary | ICD-10-CM

## 2018-07-13 DIAGNOSIS — R1310 Dysphagia, unspecified: Secondary | ICD-10-CM | POA: Diagnosis present

## 2018-07-13 DIAGNOSIS — R0602 Shortness of breath: Secondary | ICD-10-CM

## 2018-07-13 DIAGNOSIS — E876 Hypokalemia: Secondary | ICD-10-CM | POA: Diagnosis present

## 2018-07-13 DIAGNOSIS — Z882 Allergy status to sulfonamides status: Secondary | ICD-10-CM

## 2018-07-13 DIAGNOSIS — Z885 Allergy status to narcotic agent status: Secondary | ICD-10-CM

## 2018-07-13 DIAGNOSIS — Z833 Family history of diabetes mellitus: Secondary | ICD-10-CM

## 2018-07-13 DIAGNOSIS — A419 Sepsis, unspecified organism: Secondary | ICD-10-CM | POA: Diagnosis present

## 2018-07-13 DIAGNOSIS — K819 Cholecystitis, unspecified: Secondary | ICD-10-CM

## 2018-07-13 DIAGNOSIS — Z7982 Long term (current) use of aspirin: Secondary | ICD-10-CM

## 2018-07-13 DIAGNOSIS — R4182 Altered mental status, unspecified: Secondary | ICD-10-CM | POA: Diagnosis present

## 2018-07-13 LAB — URINALYSIS, ROUTINE W REFLEX MICROSCOPIC
Bilirubin Urine: NEGATIVE
Glucose, UA: >=500 mg/dL — AB
Ketones, ur: 20 mg/dL — AB
Nitrite: NEGATIVE
Protein, ur: 100 mg/dL — AB
Specific Gravity, Urine: 1.015 (ref 1.005–1.030)
WBC, UA: >50 WBC/hpf — ABNORMAL HIGH (ref 0–5)
pH: 6 (ref 5.0–8.0)

## 2018-07-13 LAB — CBC WITH DIFFERENTIAL/PLATELET
Abs Immature Granulocytes: 0.21 10*3/uL — ABNORMAL HIGH (ref 0.00–0.07)
Basophils Absolute: 0.1 10*3/uL (ref 0.0–0.1)
Basophils Relative: 0 %
Eosinophils Absolute: 0.1 10*3/uL (ref 0.0–0.5)
Eosinophils Relative: 0 %
HCT: 39.8 % (ref 36.0–46.0)
Hemoglobin: 12.5 g/dL (ref 12.0–15.0)
Immature Granulocytes: 1 %
Lymphocytes Relative: 3 %
Lymphs Abs: 0.8 10*3/uL (ref 0.7–4.0)
MCH: 26.2 pg (ref 26.0–34.0)
MCHC: 31.4 g/dL (ref 30.0–36.0)
MCV: 83.4 fL (ref 80.0–100.0)
Monocytes Absolute: 1.1 10*3/uL — ABNORMAL HIGH (ref 0.1–1.0)
Monocytes Relative: 4 %
Neutro Abs: 24.7 10*3/uL — ABNORMAL HIGH (ref 1.7–7.7)
Neutrophils Relative %: 92 %
Platelets: 405 10*3/uL — ABNORMAL HIGH (ref 150–400)
RBC: 4.77 MIL/uL (ref 3.87–5.11)
RDW: 17.8 % — ABNORMAL HIGH (ref 11.5–15.5)
WBC: 26.9 10*3/uL — ABNORMAL HIGH (ref 4.0–10.5)
nRBC: 0.1 % (ref 0.0–0.2)

## 2018-07-13 LAB — COMPREHENSIVE METABOLIC PANEL
ALT: 18 U/L (ref 0–44)
AST: 17 U/L (ref 15–41)
Albumin: 3.3 g/dL — ABNORMAL LOW (ref 3.5–5.0)
Alkaline Phosphatase: 86 U/L (ref 38–126)
Anion gap: 20 — ABNORMAL HIGH (ref 5–15)
BUN: 36 mg/dL — ABNORMAL HIGH (ref 8–23)
CO2: 18 mmol/L — ABNORMAL LOW (ref 22–32)
Calcium: 9.3 mg/dL (ref 8.9–10.3)
Chloride: 105 mmol/L (ref 98–111)
Creatinine, Ser: 1.75 mg/dL — ABNORMAL HIGH (ref 0.44–1.00)
GFR calc Af Amer: 32 mL/min — ABNORMAL LOW (ref >60)
GFR calc non Af Amer: 27 mL/min — ABNORMAL LOW (ref >60)
Glucose, Bld: 542 mg/dL (ref 70–99)
Potassium: 3.9 mmol/L (ref 3.5–5.1)
Sodium: 143 mmol/L (ref 135–145)
Total Bilirubin: 2 mg/dL — ABNORMAL HIGH (ref 0.3–1.2)
Total Protein: 7.3 g/dL (ref 6.5–8.1)

## 2018-07-13 LAB — LACTIC ACID, PLASMA: Lactic Acid, Venous: 4 mmol/L (ref 0.5–1.9)

## 2018-07-13 LAB — CBG MONITORING, ED: Glucose-Capillary: 445 mg/dL — ABNORMAL HIGH (ref 70–99)

## 2018-07-13 MED ORDER — SODIUM CHLORIDE 0.9 % IV SOLN
2.0000 g | Freq: Once | INTRAVENOUS | Status: AC
  Administered 2018-07-13: 21:00:00 2 g via INTRAVENOUS
  Filled 2018-07-13: qty 2

## 2018-07-13 MED ORDER — SODIUM CHLORIDE 0.9 % IV BOLUS (SEPSIS)
1000.0000 mL | Freq: Once | INTRAVENOUS | Status: AC
  Administered 2018-07-13: 22:00:00 1000 mL via INTRAVENOUS

## 2018-07-13 MED ORDER — VANCOMYCIN HCL IN DEXTROSE 750-5 MG/150ML-% IV SOLN
750.0000 mg | Freq: Once | INTRAVENOUS | Status: AC
  Administered 2018-07-14: 02:00:00 750 mg via INTRAVENOUS
  Filled 2018-07-13: qty 150

## 2018-07-13 MED ORDER — SODIUM CHLORIDE 0.9 % IV BOLUS (SEPSIS)
500.0000 mL | Freq: Once | INTRAVENOUS | Status: AC
  Administered 2018-07-13: 23:00:00 via INTRAVENOUS

## 2018-07-13 MED ORDER — METRONIDAZOLE IN NACL 5-0.79 MG/ML-% IV SOLN
500.0000 mg | Freq: Once | INTRAVENOUS | Status: AC
  Administered 2018-07-13: 21:00:00 500 mg via INTRAVENOUS
  Filled 2018-07-13: qty 100

## 2018-07-13 MED ORDER — ACETAMINOPHEN 650 MG RE SUPP
650.0000 mg | Freq: Once | RECTAL | Status: AC
  Administered 2018-07-13: 23:00:00 650 mg via RECTAL
  Filled 2018-07-13 (×2): qty 1

## 2018-07-13 MED ORDER — VANCOMYCIN HCL IN DEXTROSE 1-5 GM/200ML-% IV SOLN
1000.0000 mg | Freq: Once | INTRAVENOUS | Status: AC
  Administered 2018-07-13: 22:00:00 1000 mg via INTRAVENOUS
  Filled 2018-07-13: qty 200

## 2018-07-13 NOTE — ED Notes (Signed)
Pt provided with peri care and given clean bedding due to being soiled.

## 2018-07-13 NOTE — ED Notes (Signed)
EDP Belfi made aware of rectal temp of 106.1.

## 2018-07-13 NOTE — ED Notes (Signed)
Pt provided with ice packs to decrease temp, per Dr. Tamera Punt.

## 2018-07-13 NOTE — ED Notes (Signed)
RN Larene Beach aware of CBG of 445

## 2018-07-13 NOTE — ED Triage Notes (Signed)
Patient here from home with complaints of altered mental status. Family reports that patient was recently discharged from here for UTI. Currently not responding. Hx of dementia.

## 2018-07-13 NOTE — ED Notes (Signed)
Date and time results received: 06/25/2018 2109 (use smartphrase ".now" to insert current time)  Test: Lactic Acid Critical Value: 4.0  Name of Provider Notified: Tamera Punt, MD  Orders Received? Or Actions Taken?: Orders Received - See Orders for details

## 2018-07-13 NOTE — Progress Notes (Signed)
A consult was received from an ED physician for vancomycin and cefpime per pharmacy dosing.  The patient's profile has been reviewed for ht/wt/allergies/indication/available labs.    A one time order has been placed for vancomycin 1000 mg and cefepime 2gm IV .  Further antibiotics/pharmacy consults should be ordered by admitting physician if indicated.                       Thank you, Lynelle Doctor 07/10/2018  8:09 PM

## 2018-07-13 NOTE — ED Notes (Addendum)
Pt provided with peri care due to being soiled. Pt repositioned.

## 2018-07-13 NOTE — ED Notes (Signed)
Date and time results received: 07/17/2018 2109 (use smartphrase ".now" to insert current time)  Test: Glucose Critical Value: 542  Name of Provider Notified: Tamera Punt, MD  Orders Received? Or Actions Taken?: Orders Received - See Orders for details

## 2018-07-13 NOTE — ED Notes (Signed)
Bed: DG38 Expected date:  Expected time:  Means of arrival:  Comments: Hold sespis

## 2018-07-13 NOTE — ED Provider Notes (Signed)
[place] DEPT Provider Note   CSN: [id number] Arrival date & time: 07/06/2018  1927    History   Chief Complaint Chief Complaint  Patient presents with   Altered Mental Status    HPI ROKHAYA QUINN is a 78 y.o. female.     Patient is a 78 year old female with a history of dementia, hypertension, diabetes, hyperlipidemia and chronic indwelling Foley catheter who presents with altered mental status.  Patient was recently admitted for sepsis UTI.  She was admitted from May 15 and discharged on May 22.  She reportedly was sent here today for altered mental status.  History is limited due to her mental status.     Past Medical History:  Diagnosis Date   Anxiety    Dementia ([place])    Depression    Diabetes mellitus    Dyspnea    GERD (gastroesophageal reflux disease)    Headache(784.0)    HTN (hypertension)    dr Johnsie Cancel   Hypercholesteremia    DENIES   Osteoarthritis    PONV (postoperative nausea and vomiting)    RLS (restless legs syndrome)    Stress    UTI (urinary tract infection) 05/27/2014    Patient Active Problem List   Diagnosis Date Noted   Urinary tract infection associated with catheterization of urinary tract, initial encounter ([place]) 07/06/2018   SVT (supraventricular tachycardia) ([place])    Altered mental status    Community acquired pneumonia of left upper lobe of lung ([place]) 05/18/2018   Pressure injury of skin 05/11/2018   Hypertensive urgency 09/11/2016   Dental abscess 09/11/2016   Hypokalemia 09/11/2016   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   Acute post-hemorrhagic anemia 07/09/2016   Symptomatic anemia 07/09/2016   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 07/04/2016   Abdominal pain, chronic, epigastric 08/10/2015   Dehydration    Leukocytosis    Aspiration pneumonia ([place]) 07/21/2014   DM type 2 (diabetes mellitus, type 2) ([place]) 07/21/2014   Palliative  care encounter 07/21/2014   Dysphagia 07/09/2014   Decubitus ulcer 07/09/2014   Blood poisoning    Acute respiratory failure with hypoxia ([place]) 06/30/2014   Septic shock ([place]) 06/30/2014   Lactic acidosis 06/30/2014   Dementia ([place]) 06/30/2014   Acute respiratory failure with hypoxemia ([facility])    Hypernatremia    Acute kidney injury ([place]) 05/28/2014   Fever 05/27/2014   Sepsis ([place]) 05/27/2014   Acute encephalopathy    FTT (failure to thrive) in adult    Gait instability 07/18/2013    Class: Acute   Low back pain 07/18/2013    Class: Acute   Nausea with vomiting 07/18/2013    Class: Acute   Abnormality of gait 04/29/2013   Spinal stenosis, lumbar region, 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   Head trauma 06/03/2010   HYPERCHOLESTEROLEMIA 05/15/2008   ANXIETY DEPRESSION 05/15/2008   DEPRESSION 05/15/2008   Essential hypertension 05/15/2008   OSTEOARTHRITIS 05/15/2008   Edema 05/15/2008   DYSPNEA ON EXERTION 05/15/2008   ELECTROCARDIOGRAM, ABNORMAL 05/15/2008    Past Surgical History:  Procedure Laterality Date   ABDOMINAL HYSTERECTOMY     COLONOSCOPY     JOINT REPLACEMENT     left forearm fracture with ORIF Left    LUMBAR LAMINECTOMY/DECOMPRESSION MICRODISCECTOMY N/A 04/18/2012   Procedure: LUMBAR LAMINECTOMY/DECOMPRESSION MICRODISCECTOMY 3 LEVELS;  Surgeon: Winfield Cunas, MD;  Location: [facility];  Service: Neurosurgery;  Laterality: N/A;  Lumbar three-four, lumbar four-five, lumbar five-sacral one decompression. Synovial cyst resection lumbar four-five  ORIF PERIPROSTHETIC FRACTURE Right 07/06/2016   Procedure: OPEN REDUCTION INTERNAL FIXATION (ORIF) PERIPROSTHETIC FRACTURE;  Surgeon: Gaynelle Arabian, MD;  Location: [facility] ORS;  Service: Orthopedics;  Laterality: Right;   REPLACEMENT TOTAL KNEE Right    WISDOM TOOTH EXTRACTION       OB History   No obstetric history on file.      Home Medications    Prior to Admission  medications   Medication Sig Start Date End Date Taking? Authorizing Provider  acetaminophen (TYLENOL) 500 MG tablet Take 500 mg by mouth every 8 (eight) hours as needed for mild pain.   Yes [provider]  ALPRAZolam (XANAX) 0.25 MG tablet Take 1 tablet (0.25 mg total) by mouth at bedtime as needed for anxiety. 05/24/18  Yes Mariel Aloe, MD  aspirin 325 MG EC tablet Take 325 mg by mouth daily.   Yes [provider]  Infant Care Products (DERMACLOUD) CREA Apply 1 application topically as needed (each change).   Yes [provider]  loratadine-pseudoephedrine (CLARITIN-D 12-HOUR) 5-120 MG tablet Take 1 tablet by mouth 2 (two) times daily as needed for allergies.   Yes [provider]  Magnesium Oxide 500 MG TABS Take 500 mg by mouth daily.    Yes [provider]  metFORMIN (GLUCOPHAGE) 500 MG tablet Take 500-1,000 mg by mouth See admin instructions. Take 1,000mg  by mouth in morning, 500mg  by mouth at lunch, and 500mg  by mouth in evening   Yes [provider]  omeprazole (PRILOSEC) 40 MG capsule Take 40 mg by mouth daily. 03/09/18  Yes [provider]  ondansetron (ZOFRAN) 4 MG tablet Take 4 mg by mouth every 6 (six) hours as needed for nausea/vomiting. 02/13/18  Yes [provider]  pramipexole (MIRAPEX) 1.5 MG tablet Take 1.5 mg by mouth 3 (three) times daily.   Yes [provider]    Family History Family History  Problem Relation Age of Onset   Diabetes Father    High blood pressure Father    High blood pressure Mother    Colon cancer Neg Hx    Esophageal cancer Neg Hx    Liver disease Neg Hx    Stomach cancer Neg Hx    Pancreatic cancer Neg Hx     Social History Social History   Tobacco Use   Smoking status: Never Smoker   Smokeless tobacco: Never Used  Substance Use Topics   Alcohol use: No    Alcohol/week: 0.0 standard drinks   Drug use: No     Allergies   Flounder [fish  allergy]; Morphine and related; Oxycodone; and Sulfonamide derivatives   Review of Systems Review of Systems  Unable to perform ROS: Dementia     Physical Exam Updated Vital Signs BP (!) 108/58 (BP Location: Right Arm)    Pulse (!) 132    Temp (!) 106.1 F (41.2 C) (Rectal)    Resp (!) 46    SpO2 99%   Physical Exam Constitutional:      Appearance: She is well-developed. She is ill-appearing.  HENT:     Head: Normocephalic and atraumatic.  Eyes:     Conjunctiva/sclera: Conjunctivae normal.  Neck:     Musculoskeletal: Normal range of motion and neck supple.  Cardiovascular:     Rate and Rhythm: Regular rhythm. Tachycardia present.     Heart sounds: Normal heart sounds.  Pulmonary:     Effort: Pulmonary effort is normal. No respiratory distress.     Breath sounds: Rhonchi present.  No wheezing or rales.  Chest:     Chest wall: No tenderness.  Abdominal:     General: Bowel sounds are normal.     Palpations: Abdomen is soft.     Tenderness: There is abdominal tenderness (pt grimaces on palpation of abdomen, generalized). There is no guarding or rebound.  Musculoskeletal: Normal range of motion.  Lymphadenopathy:     Cervical: No cervical adenopathy.  Skin:    General: Skin is warm and dry.     Findings: No rash.  Neurological:     Mental Status: She is alert.     Comments: Awake with eyes open, nonverbal, has contractures      ED Treatments / Results  Labs (all labs ordered are listed, but only abnormal results are displayed) Labs Reviewed  LACTIC ACID, PLASMA - Abnormal; Notable for the following components:      Result Value   Lactic Acid, Venous 4.0 (*)    All other components within normal limits  COMPREHENSIVE METABOLIC PANEL - Abnormal; Notable for the following components:   CO2 18 (*)    Glucose, Bld 542 (*)    BUN 36 (*)    Creatinine, Ser 1.75 (*)    Albumin 3.3 (*)    Total Bilirubin 2.0 (*)    GFR calc non Af Amer 27 (*)    GFR calc Af Amer 32  (*)    Anion gap 20 (*)    All other components within normal limits  CBC WITH DIFFERENTIAL/PLATELET - Abnormal; Notable for the following components:   WBC 26.9 (*)    RDW 17.8 (*)    Platelets 405 (*)    Neutro Abs 24.7 (*)    Monocytes Absolute 1.1 (*)    Abs Immature Granulocytes 0.21 (*)    All other components within normal limits  URINALYSIS, ROUTINE W REFLEX MICROSCOPIC - Abnormal; Notable for the following components:   APPearance CLOUDY (*)    Glucose, UA >=500 (*)    Hgb urine dipstick MODERATE (*)    Ketones, ur 20 (*)    Protein, ur 100 (*)    Leukocytes,Ua LARGE (*)    WBC, UA >50 (*)    Bacteria, UA FEW (*)    All other components within normal limits  CULTURE, BLOOD (ROUTINE X 2)  CULTURE, BLOOD (ROUTINE X 2)  URINE CULTURE  SARS CORONAVIRUS 2 (HOSPITAL ORDER, [place] [facility])  LACTIC ACID, PLASMA  CBG MONITORING, ED    EKG EKG Interpretation  Date/Time:  Friday Jul 13 2018 20:40:14 EDT Ventricular Rate:  142 PR Interval:    QRS Duration: 76 QT Interval:  333 QTC Calculation: 512 R Axis:   -35 Text Interpretation:  Sinus tachycardia Ventricular premature complex Aberrant conduction of SV complex(es) Abnormal R-wave progression, early transition Left ventricular hypertrophy Anterior Q waves, possibly due to LVH Nonspecific T abnormalities, lateral leads Confirmed by Malvin Johns [phone]) on 07/10/2018 9:26:07 PM   Radiology Ct Abdomen Pelvis Wo Contrast  Result Date: 06/29/2018 CLINICAL DATA:  History of recent UTI with generalized abdominal pain EXAM: CT ABDOMEN AND PELVIS WITHOUT CONTRAST TECHNIQUE: Multidetector CT imaging of the abdomen and pelvis was performed following the standard protocol without IV contrast. COMPARISON:  08/10/2016 FINDINGS: Lower chest: Mild scarring is noted in the bases bilaterally. No focal infiltrate or effusion is seen. Hepatobiliary: Gallbladder is significantly distended with pericholecystic  inflammatory change and dependent cholelithiasis. These changes are consistent with acute cholecystitis till proven otherwise. Ultrasound may  be helpful for further evaluation. Pancreas: Unremarkable. No pancreatic ductal dilatation or surrounding inflammatory changes. Spleen: Normal in size without focal abnormality. Adrenals/Urinary Tract: Adrenal glands are within normal limits bilaterally. Kidneys are unremarkable. No renal calculi or obstructive changes are noted. The bladder is decompressed by Foley catheter. Stomach/Bowel: Contrast is noted throughout the large bowel. No obstructive or inflammatory changes are seen. The appendix is not well visualized although no inflammatory changes are seen. Stomach is distended with fluid with evidence of reflux into the distal esophagus. No small bowel abnormality is noted. Vascular/Lymphatic: Aortic atherosclerosis. No enlarged abdominal or pelvic lymph nodes. Reproductive: Status post hysterectomy. No adnexal masses. Other: No abdominal wall hernia or abnormality. No abdominopelvic ascites. Musculoskeletal: Right hip prosthesis is noted. Degenerative changes of lumbar spine are noted. Prior vertebral augmentation at L3 is noted. No acute abnormality is seen. IMPRESSION: Significantly dilated gallbladder with gallstones within. Considerable peri cholecystic inflammatory changes are noted consistent with acute cholecystitis. Right upper quadrant ultrasound may be helpful for further evaluation. Chronic changes as described above. Electronically Signed   By: Inez Catalina M.D.   On: 07/15/2018 20:47   Ct Head Wo Contrast  Result Date: 07/21/2018 CLINICAL DATA:  Altered mental status, history of UTIs EXAM: CT HEAD WITHOUT CONTRAST TECHNIQUE: Contiguous axial images were obtained from the base of the skull through the vertex without intravenous contrast. COMPARISON:  04/19/2018 FINDINGS: Brain: Chronic atrophic changes are noted with ventriculomegaly. Changes of chronic  white matter ischemic change are again noted. No findings to suggest acute hemorrhage, acute infarction or space-occupying mass lesion are noted. Vascular: No hyperdense vessel or unexpected calcification. Skull: Normal. Negative for fracture or focal lesion. Sinuses/Orbits: No acute finding. Other: None. IMPRESSION: Atrophic and ischemic changes without acute abnormality. Electronically Signed   By: Inez Catalina M.D.   On: 07/10/2018 20:41   Dg Chest Port 1 View  Result Date: 07/21/2018 CLINICAL DATA:  Altered mental status, history of recent UTI, possible sepsis EXAM: PORTABLE CHEST 1 VIEW COMPARISON:  07/06/2018 FINDINGS: The heart size and mediastinal contours are within normal limits. Both lungs are clear. The visualized skeletal structures are unremarkable. IMPRESSION: No active disease. Electronically Signed   By: Inez Catalina M.D.   On: 07/08/2018 20:48    Procedures Procedures (including critical care time)  Medications Ordered in ED Medications  vancomycin (VANCOCIN) IVPB 1000 mg/200 mL premix (has no administration in time range)  acetaminophen (TYLENOL) suppository 650 mg (has no administration in time range)  sodium chloride 0.9 % bolus 1,000 mL (1,000 mLs Intravenous New Bag/Given 07/12/2018 2131)    And  sodium chloride 0.9 % bolus 1,000 mL (has no administration in time range)    And  sodium chloride 0.9 % bolus 500 mL (has no administration in time range)  ceFEPIme (MAXIPIME) 2 g in sodium chloride 0.9 % 100 mL IVPB (0 g Intravenous Stopped 06/28/2018 2129)  metroNIDAZOLE (FLAGYL) IVPB 500 mg (500 mg Intravenous New Bag/Given 06/30/2018 2112)     Initial Impression / Assessment and Plan / ED Course  I have reviewed the triage vital signs and the nursing notes.  Pertinent labs & imaging results that were available during my care of the patient were reviewed by me and considered in my medical decision making (see chart for details).        Patient is a 78 year old female who  presents with fever and altered mental status.  She has a temperature of 106 rectally.  She was placed on ice  packs and given IV fluids.  Sepsis protocol was started on arrival.  She was given broad-spectrum antibiotics.  Her lactate came back at 4 and was started on the 30 cc/kg fluid bolus.  Her labs show an elevated white count of 26,000.  She also has an acute kidney injury.  Chest x-ray shows no evidence of pneumonia.  Her urine does appear infected.  Her CT scan also shows evidence of probable acute cholecystitis.  I spoke with Dr. Waynard Edwards with general surgery who will consult on the patient in the morning.  He recommends at this point to continue resuscitation and IV antibiotics.  Potentially she will be a candidate for percutaneous drainage of the gallbladder.  I spoke with Dr. Denton Brick who will admit the patient.  CRITICAL CARE Performed by: Malvin Johns Total critical care time: 60 minutes Critical care time was exclusive of separately billable procedures and treating other patients. Critical care was necessary to treat or prevent imminent or life-threatening deterioration. Critical care was time spent personally by me on the following activities: development of treatment plan with patient and/or surrogate as well as nursing, discussions with consultants, evaluation of patient's response to treatment, examination of patient, obtaining history from patient or surrogate, ordering and performing treatments and interventions, ordering and review of laboratory studies, ordering and review of radiographic studies, pulse oximetry and re-evaluation of patient's condition.   Final Clinical Impressions(s) / ED Diagnoses   Final diagnoses:  Sepsis, due to unspecified organism, unspecified whether acute organ dysfunction present [facility])  Acute cystitis without hematuria  Cholecystitis    ED Discharge Orders    None       Malvin Johns, MD 06/22/2018 2218

## 2018-07-14 ENCOUNTER — Encounter (HOSPITAL_COMMUNITY): Payer: Self-pay | Admitting: Interventional Radiology

## 2018-07-14 ENCOUNTER — Other Ambulatory Visit: Payer: Self-pay

## 2018-07-14 ENCOUNTER — Inpatient Hospital Stay (HOSPITAL_COMMUNITY): Payer: Medicare Other

## 2018-07-14 HISTORY — PX: IR PERC CHOLECYSTOSTOMY: IMG2326

## 2018-07-14 LAB — COMPREHENSIVE METABOLIC PANEL
ALT: 20 U/L (ref 0–44)
AST: 23 U/L (ref 15–41)
Albumin: 2.7 g/dL — ABNORMAL LOW (ref 3.5–5.0)
Alkaline Phosphatase: 67 U/L (ref 38–126)
Anion gap: 16 — ABNORMAL HIGH (ref 5–15)
BUN: 43 mg/dL — ABNORMAL HIGH (ref 8–23)
CO2: 19 mmol/L — ABNORMAL LOW (ref 22–32)
Calcium: 8.5 mg/dL — ABNORMAL LOW (ref 8.9–10.3)
Chloride: 111 mmol/L (ref 98–111)
Creatinine, Ser: 1.82 mg/dL — ABNORMAL HIGH (ref 0.44–1.00)
GFR calc Af Amer: 30 mL/min — ABNORMAL LOW (ref >60)
GFR calc non Af Amer: 26 mL/min — ABNORMAL LOW (ref >60)
Glucose, Bld: 396 mg/dL — ABNORMAL HIGH (ref 70–99)
Potassium: 2.7 mmol/L — CL (ref 3.5–5.1)
Sodium: 146 mmol/L — ABNORMAL HIGH (ref 135–145)
Total Bilirubin: 0.8 mg/dL (ref 0.3–1.2)
Total Protein: 6.3 g/dL — ABNORMAL LOW (ref 6.5–8.1)

## 2018-07-14 LAB — BASIC METABOLIC PANEL
Anion gap: 2 — ABNORMAL LOW (ref 5–15)
Anion gap: 9 (ref 5–15)
BUN: 42 mg/dL — ABNORMAL HIGH (ref 8–23)
BUN: 49 mg/dL — ABNORMAL HIGH (ref 8–23)
CO2: 20 mmol/L — ABNORMAL LOW (ref 22–32)
CO2: 23 mmol/L (ref 22–32)
Calcium: 7.4 mg/dL — ABNORMAL LOW (ref 8.9–10.3)
Calcium: 8.2 mg/dL — ABNORMAL LOW (ref 8.9–10.3)
Chloride: 113 mmol/L — ABNORMAL HIGH (ref 98–111)
Chloride: 113 mmol/L — ABNORMAL HIGH (ref 98–111)
Creatinine, Ser: 1.61 mg/dL — ABNORMAL HIGH (ref 0.44–1.00)
Creatinine, Ser: 1.69 mg/dL — ABNORMAL HIGH (ref 0.44–1.00)
GFR calc Af Amer: 35 mL/min — ABNORMAL LOW (ref >60)
GFR calc Af Amer: 33 mL/min — ABNORMAL LOW (ref >60)
GFR calc non Af Amer: 30 mL/min — ABNORMAL LOW (ref >60)
GFR calc non Af Amer: 28 mL/min — ABNORMAL LOW (ref >60)
Glucose, Bld: 178 mg/dL — ABNORMAL HIGH (ref 70–99)
Glucose, Bld: 119 mg/dL — ABNORMAL HIGH (ref 70–99)
Potassium: 14.3 mmol/L (ref 3.5–5.1)
Potassium: 5.1 mmol/L (ref 3.5–5.1)
Sodium: 135 mmol/L (ref 135–145)
Sodium: 145 mmol/L (ref 135–145)

## 2018-07-14 LAB — CBC
HCT: 35.6 % — ABNORMAL LOW (ref 36.0–46.0)
Hemoglobin: 10.6 g/dL — ABNORMAL LOW (ref 12.0–15.0)
MCH: 25.6 pg — ABNORMAL LOW (ref 26.0–34.0)
MCHC: 29.8 g/dL — ABNORMAL LOW (ref 30.0–36.0)
MCV: 86 fL (ref 80.0–100.0)
Platelets: 307 10*3/uL (ref 150–400)
RBC: 4.14 MIL/uL (ref 3.87–5.11)
RDW: 17.6 % — ABNORMAL HIGH (ref 11.5–15.5)
WBC: 21.6 10*3/uL — ABNORMAL HIGH (ref 4.0–10.5)
nRBC: 0 % (ref 0.0–0.2)

## 2018-07-14 LAB — GLUCOSE, CAPILLARY
Glucose-Capillary: 529 mg/dL (ref 70–99)
Glucose-Capillary: 508 mg/dL (ref 70–99)
Glucose-Capillary: 480 mg/dL — ABNORMAL HIGH (ref 70–99)
Glucose-Capillary: 379 mg/dL — ABNORMAL HIGH (ref 70–99)
Glucose-Capillary: 311 mg/dL — ABNORMAL HIGH (ref 70–99)
Glucose-Capillary: 249 mg/dL — ABNORMAL HIGH (ref 70–99)
Glucose-Capillary: 168 mg/dL — ABNORMAL HIGH (ref 70–99)
Glucose-Capillary: 177 mg/dL — ABNORMAL HIGH (ref 70–99)
Glucose-Capillary: 111 mg/dL — ABNORMAL HIGH (ref 70–99)
Glucose-Capillary: 117 mg/dL — ABNORMAL HIGH (ref 70–99)
Glucose-Capillary: 108 mg/dL — ABNORMAL HIGH (ref 70–99)
Glucose-Capillary: 144 mg/dL — ABNORMAL HIGH (ref 70–99)
Glucose-Capillary: 194 mg/dL — ABNORMAL HIGH (ref 70–99)
Glucose-Capillary: 197 mg/dL — ABNORMAL HIGH (ref 70–99)

## 2018-07-14 LAB — PROTIME-INR
INR: 1.5 — ABNORMAL HIGH (ref 0.8–1.2)
Prothrombin Time: 18.2 seconds — ABNORMAL HIGH (ref 11.4–15.2)

## 2018-07-14 LAB — SARS CORONAVIRUS 2 BY RT PCR (HOSPITAL ORDER, PERFORMED IN ~~LOC~~ HOSPITAL LAB): SARS Coronavirus 2: NEGATIVE

## 2018-07-14 LAB — POTASSIUM: Potassium: 4.7 mmol/L (ref 3.5–5.1)

## 2018-07-14 LAB — CULTURE, BLOOD (ROUTINE X 2): Special Requests: ADEQUATE

## 2018-07-14 LAB — LACTIC ACID, PLASMA: Lactic Acid, Venous: 1.9 mmol/L (ref 0.5–1.9)

## 2018-07-14 LAB — MAGNESIUM: Magnesium: 1.5 mg/dL — ABNORMAL LOW (ref 1.7–2.4)

## 2018-07-14 LAB — MRSA PCR SCREENING: MRSA by PCR: NEGATIVE

## 2018-07-14 MED ORDER — MORPHINE SULFATE (PF) 2 MG/ML IV SOLN
2.0000 mg | INTRAVENOUS | Status: DC | PRN
  Administered 2018-07-14: 2 mg via INTRAVENOUS
  Filled 2018-07-14: qty 1

## 2018-07-14 MED ORDER — INSULIN ASPART 100 UNIT/ML ~~LOC~~ SOLN: 0.0000 [IU] | Freq: Every day | SUBCUTANEOUS | Status: DC

## 2018-07-14 MED ORDER — POTASSIUM CHLORIDE 10 MEQ/100ML IV SOLN
10.0000 meq | INTRAVENOUS | Status: AC
  Administered 2018-07-14 (×2): 10 meq via INTRAVENOUS
  Filled 2018-07-14: qty 100

## 2018-07-14 MED ORDER — SODIUM CHLORIDE 0.9 % IV BOLUS
INTRAVENOUS | Status: AC | PRN
  Administered 2018-07-14: 500 mL/h via INTRAVENOUS

## 2018-07-14 MED ORDER — SODIUM CHLORIDE 0.9 % IV SOLN
INTRAVENOUS | Status: DC
  Administered 2018-07-14: 02:00:00 via INTRAVENOUS

## 2018-07-14 MED ORDER — SODIUM CHLORIDE 0.9 % IV SOLN: INTRAVENOUS | Status: DC

## 2018-07-14 MED ORDER — ORAL CARE MOUTH RINSE
15.0000 mL | Freq: Two times a day (BID) | OROMUCOSAL | Status: DC
  Administered 2018-07-14 – 2018-07-16 (×3): 15 mL via OROMUCOSAL

## 2018-07-14 MED ORDER — INSULIN ASPART 100 UNIT/ML ~~LOC~~ SOLN
0.0000 [IU] | Freq: Three times a day (TID) | SUBCUTANEOUS | Status: DC
  Administered 2018-07-14: 17:00:00 2 [IU] via SUBCUTANEOUS
  Administered 2018-07-15: 3 [IU] via SUBCUTANEOUS
  Administered 2018-07-15: 1 [IU] via SUBCUTANEOUS
  Administered 2018-07-15: 3 [IU] via SUBCUTANEOUS
  Administered 2018-07-16 (×2): 2 [IU] via SUBCUTANEOUS

## 2018-07-14 MED ORDER — INSULIN REGULAR(HUMAN) IN NACL 100-0.9 UT/100ML-% IV SOLN
INTRAVENOUS | Status: DC
  Administered 2018-07-14: 4.7 [IU]/h via INTRAVENOUS
  Filled 2018-07-14 (×2): qty 100

## 2018-07-14 MED ORDER — DEXTROSE-NACL 5-0.45 % IV SOLN
INTRAVENOUS | Status: DC
  Administered 2018-07-14: 08:00:00 via INTRAVENOUS

## 2018-07-14 MED ORDER — INSULIN ASPART 100 UNIT/ML ~~LOC~~ SOLN: 0.0000 [IU] | Freq: Three times a day (TID) | SUBCUTANEOUS | Status: DC

## 2018-07-14 MED ORDER — SODIUM CHLORIDE 0.9% FLUSH
5.0000 mL | Freq: Three times a day (TID) | INTRAVENOUS | Status: DC
  Administered 2018-07-14 – 2018-07-15 (×5): 5 mL

## 2018-07-14 MED ORDER — METOPROLOL TARTRATE 5 MG/5ML IV SOLN
INTRAVENOUS | Status: AC
  Filled 2018-07-14: qty 5

## 2018-07-14 MED ORDER — VANCOMYCIN HCL 10 G IV SOLR
1500.0000 mg | INTRAVENOUS | Status: DC
  Filled 2018-07-14: qty 1500

## 2018-07-14 MED ORDER — DILTIAZEM HCL 25 MG/5ML IV SOLN
10.0000 mg | Freq: Once | INTRAVENOUS | Status: AC
  Administered 2018-07-14: 21:00:00 10 mg via INTRAVENOUS
  Filled 2018-07-14: qty 5

## 2018-07-14 MED ORDER — ACETAMINOPHEN 325 MG PO TABS: 650.0000 mg | ORAL_TABLET | Freq: Four times a day (QID) | ORAL | Status: DC | PRN

## 2018-07-14 MED ORDER — MIDAZOLAM HCL 2 MG/2ML IJ SOLN
INTRAMUSCULAR | Status: AC | PRN
  Administered 2018-07-14: 0.5 mg via INTRAVENOUS

## 2018-07-14 MED ORDER — POLYETHYLENE GLYCOL 3350 17 G PO PACK: 17.0000 g | PACK | Freq: Every day | ORAL | Status: DC | PRN

## 2018-07-14 MED ORDER — ACETAMINOPHEN 650 MG RE SUPP
650.0000 mg | Freq: Four times a day (QID) | RECTAL | Status: DC | PRN
  Administered 2018-07-14: 20:00:00 650 mg via RECTAL
  Filled 2018-07-14: qty 1

## 2018-07-14 MED ORDER — FENTANYL CITRATE (PF) 100 MCG/2ML IJ SOLN
INTRAMUSCULAR | Status: AC | PRN
  Administered 2018-07-14: 25 ug via INTRAVENOUS

## 2018-07-14 MED ORDER — FENTANYL CITRATE (PF) 100 MCG/2ML IJ SOLN
INTRAMUSCULAR | Status: AC
  Filled 2018-07-14: qty 4

## 2018-07-14 MED ORDER — MIDAZOLAM HCL 2 MG/2ML IJ SOLN
INTRAMUSCULAR | Status: AC
  Filled 2018-07-14: qty 6

## 2018-07-14 MED ORDER — SODIUM CHLORIDE 0.9 % IV BOLUS: 500.0000 mL | Freq: Once | INTRAVENOUS | Status: DC

## 2018-07-14 MED ORDER — SODIUM CHLORIDE 0.9 % IV SOLN
2.0000 g | INTRAVENOUS | Status: DC
  Administered 2018-07-14 – 2018-07-15 (×2): 2 g via INTRAVENOUS
  Filled 2018-07-14 (×3): qty 2

## 2018-07-14 MED ORDER — POTASSIUM CHLORIDE 10 MEQ/100ML IV SOLN
10.0000 meq | INTRAVENOUS | Status: AC
  Administered 2018-07-14 (×6): 10 meq via INTRAVENOUS
  Filled 2018-07-14 (×6): qty 100

## 2018-07-14 MED ORDER — SODIUM CHLORIDE 0.45 % IV SOLN
INTRAVENOUS | Status: DC
  Administered 2018-07-14: 01:00:00 via INTRAVENOUS

## 2018-07-14 MED ORDER — SODIUM CHLORIDE 0.9 % IV BOLUS
500.0000 mL | Freq: Once | INTRAVENOUS | Status: AC
  Administered 2018-07-14: 08:00:00 500 mL via INTRAVENOUS

## 2018-07-14 MED ORDER — METRONIDAZOLE IN NACL 5-0.79 MG/ML-% IV SOLN
500.0000 mg | Freq: Three times a day (TID) | INTRAVENOUS | Status: DC
  Administered 2018-07-14 – 2018-07-16 (×8): 500 mg via INTRAVENOUS
  Filled 2018-07-14 (×9): qty 100

## 2018-07-14 MED ORDER — METOPROLOL TARTRATE 5 MG/5ML IV SOLN
2.5000 mg | Freq: Three times a day (TID) | INTRAVENOUS | Status: DC | PRN
  Administered 2018-07-14: 2.5 mg via INTRAVENOUS

## 2018-07-14 MED ORDER — IOHEXOL 300 MG/ML  SOLN: 50.0000 mL | Freq: Once | INTRAMUSCULAR | Status: DC | PRN

## 2018-07-14 MED ORDER — CHLORHEXIDINE GLUCONATE CLOTH 2 % EX PADS
6.0000 | MEDICATED_PAD | Freq: Every day | CUTANEOUS | Status: DC
  Administered 2018-07-14 – 2018-07-16 (×3): 6 via TOPICAL

## 2018-07-14 MED ORDER — LIDOCAINE HCL 1 % IJ SOLN
INTRAMUSCULAR | Status: AC
  Filled 2018-07-14: qty 20

## 2018-07-14 MED ORDER — MAGNESIUM SULFATE 2 GM/50ML IV SOLN
2.0000 g | Freq: Once | INTRAVENOUS | Status: AC
  Administered 2018-07-14: 20:00:00 2 g via INTRAVENOUS
  Filled 2018-07-14: qty 50

## 2018-07-14 NOTE — Consult Note (Signed)
Chief Complaint: Patient was seen in consultation today for cholecystitis/percutaneous cholecystostomy drain placement.  Referring Physician(s): Autumn Messing III  Supervising Physician: Arne [place]  Patient Status: [facility] - In-pt  History of Present Illness: [name] is a 78 y.o. female with a past medical history of hypertension, hypercholesteremia, dementia, GERD, recurrent UTIs with chronic indwelling urinary catheter, diabetes mellitus, OA, restless leg syndrome, anxiety, and depression. She presented to [facility] ED 07/08/2018 with complaint of altered mental status. Of note, patient was admitted to [facility] 07/06/2018 to 06/27/2018 for sepsis UTI. In ED, patient found to be septic with anion gap metabolic acidosis. CT abdomen/pelvis revealed acute cholecystitis with cholelithiasis. Surgery was consulted who deemed patient a poor surgical candidate and recommends IR consultation for possible percutaneous cholecystostomy tube placement.   CT abdomen/pelvis 06/23/2018: 1. Significantly dilated gallbladder with gallstones within. Considerable peri cholecystic inflammatory changes are noted consistent with acute cholecystitis. Right upper quadrant ultrasound may be helpful for further evaluation. 2. Chronic changes as described above.  IR requested by Dr. Marlou Starks for possible image-guided percutaneous cholecystostomy tube placement. Patient laying in bed resting. She is lethargic but responds to voice. History difficult to obtain from patient due to lethargy/dementia.   Past Medical History:  Diagnosis Date   Anxiety    Dementia ([place])    Depression    Diabetes mellitus    Dyspnea    GERD (gastroesophageal reflux disease)    Headache(784.0)    HTN (hypertension)    dr Johnsie Cancel   Hypercholesteremia    DENIES   Osteoarthritis    PONV (postoperative nausea and vomiting)    RLS (restless legs syndrome)    Stress    UTI (urinary tract infection) 05/27/2014    Past Surgical  History:  Procedure Laterality Date   ABDOMINAL HYSTERECTOMY     COLONOSCOPY     JOINT REPLACEMENT     left forearm fracture with ORIF Left    LUMBAR LAMINECTOMY/DECOMPRESSION MICRODISCECTOMY N/A 04/18/2012   Procedure: LUMBAR LAMINECTOMY/DECOMPRESSION MICRODISCECTOMY 3 LEVELS;  Surgeon: Winfield Cunas, MD;  Location: [facility];  Service: Neurosurgery;  Laterality: N/A;  Lumbar three-four, lumbar four-five, lumbar five-sacral one decompression. Synovial cyst resection lumbar four-five   ORIF PERIPROSTHETIC FRACTURE Right 07/06/2016   Procedure: OPEN REDUCTION INTERNAL FIXATION (ORIF) PERIPROSTHETIC FRACTURE;  Surgeon: Gaynelle Arabian, MD;  Location: [facility] ORS;  Service: Orthopedics;  Laterality: Right;   REPLACEMENT TOTAL KNEE Right    WISDOM TOOTH EXTRACTION      Allergies: Flounder [fish allergy]; Morphine and related; Oxycodone; and Sulfonamide derivatives  Medications: Prior to Admission medications   Medication Sig Start Date End Date Taking? Authorizing Provider  acetaminophen (TYLENOL) 500 MG tablet Take 500 mg by mouth every 8 (eight) hours as needed for mild pain.   Yes [provider]  ALPRAZolam (XANAX) 0.25 MG tablet Take 1 tablet (0.25 mg total) by mouth at bedtime as needed for anxiety. 05/24/18  Yes Mariel Aloe, MD  aspirin 325 MG EC tablet Take 325 mg by mouth daily.   Yes [provider]  Infant Care Products (DERMACLOUD) CREA Apply 1 application topically as needed (each change).   Yes [provider]  loratadine-pseudoephedrine (CLARITIN-D 12-HOUR) 5-120 MG tablet Take 1 tablet by mouth 2 (two) times daily as needed for allergies.   Yes [provider]  Magnesium Oxide 500 MG TABS Take 500 mg by mouth daily.    Yes [provider]  metFORMIN (GLUCOPHAGE) 500 MG tablet Take 500-1,000 mg by  mouth See admin instructions. Take 1,000mg  by mouth in morning, 500mg  by mouth at lunch, and 500mg  by mouth in evening   Yes [provider]  omeprazole (PRILOSEC) 40 MG capsule Take 40 mg by mouth daily. 03/09/18  Yes [provider]  ondansetron (ZOFRAN) 4 MG tablet Take 4 mg by mouth every 6 (six) hours as needed for nausea/vomiting. 02/13/18  Yes [provider]  pramipexole (MIRAPEX) 1.5 MG tablet Take 1.5 mg by mouth 3 (three) times daily.   Yes [provider]     Family History  Problem Relation Age of Onset   Diabetes Father    High blood pressure Father    High blood pressure Mother    Colon cancer Neg Hx    Esophageal cancer Neg Hx    Liver disease Neg Hx    Stomach cancer Neg Hx    Pancreatic cancer Neg Hx     Social History   Socioeconomic History   Marital status: Married    Spouse name: [name]   Number of children: 1   Years of education: college   Highest education level: Not on file  Occupational History    Comment: retired  Scientist, product/process development strain: Not on file   Food insecurity:    Worry: Not on file    Inability: Not on Lexicographer needs:    Medical: Not on file    Non-medical: Not on file  Tobacco Use   Smoking status: Never Smoker   Smokeless tobacco: Never Used  Substance and Sexual Activity   Alcohol use: No    Alcohol/week: 0.0 standard drinks   Drug use: No   Sexual activity: Not Currently  Lifestyle   Physical activity:    Days per week: Not on file    Minutes per session: Not on file   Stress: Not on file  Relationships   Social connections:    Talks on phone: Not on file    Gets together: Not on file    Attends religious service: Not on file    Active member of club or organization: Not on file    Attends meetings of clubs or organizations: Not on file    Relationship status: Not on file  Other Topics Concern   Not on file  Social History Narrative   Patient lives at home with her husband [name]).   Education- college    Right handed.   Caffeine- soda half cup daily.    Retired.     Review of Systems: A 12 point ROS discussed and pertinent positives are indicated in the HPI above.  All other systems are negative.  Review of Systems  Unable to perform ROS: Dementia    Vital Signs: BP 114/87 (BP Location: Right Arm)    Pulse (!) 134    Temp 98.1 F (36.7 C) (Oral)    Resp (!) 25    Ht 5\' 7"  (1.702 m)    Wt 180 lb 8.9 oz (81.9 kg)    SpO2 98%    BMI 28.28 kg/m   Physical Exam Vitals signs and nursing note reviewed.  Constitutional:      General: She is not in acute distress.    Appearance: Normal appearance.  Cardiovascular:     Rate and Rhythm: Regular rhythm. Tachycardia present.     Heart sounds: Normal heart sounds. No murmur.  Pulmonary:     Effort: Pulmonary effort is normal. No respiratory distress.  Breath sounds: Normal breath sounds. No wheezing.  Skin:    General: Skin is warm and dry.  Neurological:     Comments: Lethargic.      MD Evaluation Airway: WNL Heart: WNL Abdomen: WNL Chest/ Lungs: WNL ASA  Classification: 4 Mallampati/Airway Score: Two   Imaging: Ct Abdomen Pelvis Wo Contrast  Result Date: 07/15/2018 CLINICAL DATA:  History of recent UTI with generalized abdominal pain EXAM: CT ABDOMEN AND PELVIS WITHOUT CONTRAST TECHNIQUE: Multidetector CT imaging of the abdomen and pelvis was performed following the standard protocol without IV contrast. COMPARISON:  08/10/2016 FINDINGS: Lower chest: Mild scarring is noted in the bases bilaterally. No focal infiltrate or effusion is seen. Hepatobiliary: Gallbladder is significantly distended with pericholecystic inflammatory change and dependent cholelithiasis. These changes are consistent with acute cholecystitis till proven otherwise. Ultrasound may be helpful for further evaluation. Pancreas: Unremarkable. No pancreatic ductal dilatation or surrounding inflammatory changes. Spleen: Normal in size without focal abnormality. Adrenals/Urinary Tract: Adrenal glands are within  normal limits bilaterally. Kidneys are unremarkable. No renal calculi or obstructive changes are noted. The bladder is decompressed by Foley catheter. Stomach/Bowel: Contrast is noted throughout the large bowel. No obstructive or inflammatory changes are seen. The appendix is not well visualized although no inflammatory changes are seen. Stomach is distended with fluid with evidence of reflux into the distal esophagus. No small bowel abnormality is noted. Vascular/Lymphatic: Aortic atherosclerosis. No enlarged abdominal or pelvic lymph nodes. Reproductive: Status post hysterectomy. No adnexal masses. Other: No abdominal wall hernia or abnormality. No abdominopelvic ascites. Musculoskeletal: Right hip prosthesis is noted. Degenerative changes of lumbar spine are noted. Prior vertebral augmentation at L3 is noted. No acute abnormality is seen. IMPRESSION: Significantly dilated gallbladder with gallstones within. Considerable peri cholecystic inflammatory changes are noted consistent with acute cholecystitis. Right upper quadrant ultrasound may be helpful for further evaluation. Chronic changes as described above. Electronically Signed   By: Inez Catalina M.D.   On: 07/12/2018 20:47   Ct Head Wo Contrast  Result Date: 07/14/2018 CLINICAL DATA:  Altered mental status, history of UTIs EXAM: CT HEAD WITHOUT CONTRAST TECHNIQUE: Contiguous axial images were obtained from the base of the skull through the vertex without intravenous contrast. COMPARISON:  04/19/2018 FINDINGS: Brain: Chronic atrophic changes are noted with ventriculomegaly. Changes of chronic white matter ischemic change are again noted. No findings to suggest acute hemorrhage, acute infarction or space-occupying mass lesion are noted. Vascular: No hyperdense vessel or unexpected calcification. Skull: Normal. Negative for fracture or focal lesion. Sinuses/Orbits: No acute finding. Other: None. IMPRESSION: Atrophic and ischemic changes without acute  abnormality. Electronically Signed   By: Inez Catalina M.D.   On: 07/07/2018 20:41   Dg Chest Port 1 View  Result Date: 07/09/2018 CLINICAL DATA:  Altered mental status, history of recent UTI, possible sepsis EXAM: PORTABLE CHEST 1 VIEW COMPARISON:  07/06/2018 FINDINGS: The heart size and mediastinal contours are within normal limits. Both lungs are clear. The visualized skeletal structures are unremarkable. IMPRESSION: No active disease. Electronically Signed   By: Inez Catalina M.D.   On: 07/04/2018 20:48   Dg Chest Port 1 View  Result Date: 07/06/2018 CLINICAL DATA:  Fever. EXAM: PORTABLE CHEST 1 VIEW COMPARISON:  Radiograph 06/12/2018 FINDINGS: Low lung volumes.The cardiomediastinal contours are unchanged with mild aortic tortuosity. Pulmonary vasculature is normal. No consolidation, pleural effusion, or pneumothorax. No acute osseous abnormalities are seen. IMPRESSION: Low lung volumes without acute abnormality. Electronically Signed   By: Keith Rake M.D.   On:  07/06/2018 19:51   Dg Swallowing Func-speech Pathology  Result Date: 07/09/2018 Objective Swallowing Evaluation: Type of Study: MBS-Modified Barium Swallow Study  Patient Details Name: [name] MRN: [id number] Date of Birth: 1940-09-30 Today's Date: 07/09/2018 Time: SLP Start Time (ACUTE ONLY): 0845 -SLP Stop Time (ACUTE ONLY): 8921 SLP Time Calculation (min) (ACUTE ONLY): 20 min Past Medical History: Past Medical History: Diagnosis Date  Anxiety   Dementia ([place])   Depression   Diabetes mellitus   Dyspnea   GERD (gastroesophageal reflux disease)   Headache(784.0)   HTN (hypertension)   dr Johnsie Cancel  Hypercholesteremia   DENIES  Osteoarthritis   PONV (postoperative nausea and vomiting)   RLS (restless legs syndrome)   Stress   UTI (urinary tract infection) 05/27/2014 Past Surgical History: Past Surgical History: Procedure Laterality Date  ABDOMINAL HYSTERECTOMY    COLONOSCOPY    JOINT REPLACEMENT    left forearm fracture  with ORIF Left   LUMBAR LAMINECTOMY/DECOMPRESSION MICRODISCECTOMY N/A 04/18/2012  Procedure: LUMBAR LAMINECTOMY/DECOMPRESSION MICRODISCECTOMY 3 LEVELS;  Surgeon: Winfield Cunas, MD;  Location: [facility];  Service: Neurosurgery;  Laterality: N/A;  Lumbar three-four, lumbar four-five, lumbar five-sacral one decompression. Synovial cyst resection lumbar four-five  ORIF PERIPROSTHETIC FRACTURE Right 07/06/2016  Procedure: OPEN REDUCTION INTERNAL FIXATION (ORIF) PERIPROSTHETIC FRACTURE;  Surgeon: Gaynelle Arabian, MD;  Location: [facility]S;  Service: Orthopedics;  Laterality: Right;  REPLACEMENT TOTAL KNEE Right   WISDOM TOOTH EXTRACTION   HPI: Patient is a 78 y.o. female with PMH: advanced dementia, DM2, HTN, recurrent UTI's, dysphagia, indwelling foley catheter at baseline, who presented with worsening confusion and foul smelling urine. She is being treated for CAUTI and undergoing workup for sepsis. Patient with h/o dysphagia with most recent MBS in 2016, which revealed silent aspiration of honey thick liquids and recommended puree solids only but with significant aspiration risk.  Subjective: alert, verbalizing some Assessment / Plan / Recommendation CHL IP CLINICAL IMPRESSIONS 07/09/2018 Clinical Impression Patient presents with a mild-moderate oral and a moderate pharyngeal dysphagia. She exhibited delays in oral transit of pill in puree and delays in puree transit anterior to posterior in oral cavity. Pharyngeal phase consisted of delayed swallow initation to vallecular sinus with puree solids, and delays to pyriform sinus with tablet, thin and nectar thick liquid consistencies; residuals remaining in pyriform sinus, and lateral channel residuals with thin liquids and nectar thick liquids, and residuals in vallecular and pyriform sinus with puree solids. Patient had one instance of penetration with trace amount of thin liquids which occured during the swallow with large volume straw sip. When SLP presented a dry  spoon and pressed down on tongue, patient initiated a swallow which did help in clearing majority of pharyngeal residuals. Patient did not sense penetrate, which remained above vocal cords. Cervical esophageal phase appeared [facility] with full transit of boluses.  SLP Visit Diagnosis Dysphagia, oropharyngeal phase (R13.12) Attention and concentration deficit following -- Frontal lobe and executive function deficit following -- Impact on safety and function Moderate aspiration risk   CHL IP TREATMENT RECOMMENDATION 07/09/2018 Treatment Recommendations Therapy as outlined in treatment plan below   Prognosis 07/09/2018 Prognosis for Safe Diet Advancement Fair Barriers to Reach Goals Severity of deficits;Time post onset Barriers/Prognosis Comment -- CHL IP DIET RECOMMENDATION 07/09/2018 SLP Diet Recommendations Dysphagia 1 (Puree) solids;Thin liquid Liquid Administration via Cup;No straw Medication Administration Crushed with puree Compensations Minimize environmental distractions;Slow rate;Small sips/bites;Other (Comment) Postural Changes Seated upright at 90 degrees   CHL IP OTHER RECOMMENDATIONS 07/09/2018 Recommended Consults --  Oral Care Recommendations Oral care BID Other Recommendations --   CHL IP FOLLOW UP RECOMMENDATIONS 07/09/2018 Follow up Recommendations Home health SLP;24 hour supervision/assistance   CHL IP FREQUENCY AND DURATION 07/09/2018 Speech Therapy Frequency (ACUTE ONLY) min 2x/week Treatment Duration 1 week      CHL IP ORAL PHASE 07/09/2018 Oral Phase Impaired Oral - Pudding Teaspoon -- Oral - Pudding Cup -- Oral - Honey Teaspoon -- Oral - Honey Cup -- Oral - Nectar Teaspoon -- Oral - Nectar Cup -- Oral - Nectar Straw -- Oral - Thin Teaspoon -- Oral - Thin Cup -- Oral - Thin Straw -- Oral - Puree Weak lingual manipulation;Reduced posterior propulsion;Delayed oral transit Oral - Mech Soft -- Oral - Regular -- Oral - Multi-Consistency -- Oral - Pill Reduced posterior propulsion;Holding of bolus;Delayed oral  transit Oral Phase - Comment --  CHL IP PHARYNGEAL PHASE 07/09/2018 Pharyngeal Phase Impaired Pharyngeal- Pudding Teaspoon -- Pharyngeal -- Pharyngeal- Pudding Cup -- Pharyngeal -- Pharyngeal- Honey Teaspoon -- Pharyngeal -- Pharyngeal- Honey Cup -- Pharyngeal -- Pharyngeal- Nectar Teaspoon -- Pharyngeal -- Pharyngeal- Nectar Cup Delayed swallow initiation-pyriform sinuses;Reduced pharyngeal peristalsis;Pharyngeal residue - valleculae;Pharyngeal residue - pyriform;Lateral channel residue Pharyngeal -- Pharyngeal- Nectar Straw -- Pharyngeal -- Pharyngeal- Thin Teaspoon -- Pharyngeal -- Pharyngeal- Thin Cup Delayed swallow initiation-pyriform sinuses;Pharyngeal residue - pyriform;Lateral channel residue Pharyngeal -- Pharyngeal- Thin Straw Penetration/Aspiration during swallow;Delayed swallow initiation-pyriform sinuses;Pharyngeal residue - pyriform;Lateral channel residue Pharyngeal Material enters airway, remains ABOVE vocal cords and not ejected out Pharyngeal- Puree Delayed swallow initiation-vallecula;Pharyngeal residue - valleculae;Pharyngeal residue - pyriform;Other (Comment) Pharyngeal -- Pharyngeal- Mechanical Soft -- Pharyngeal -- Pharyngeal- Regular -- Pharyngeal -- Pharyngeal- Multi-consistency -- Pharyngeal -- Pharyngeal- Pill Delayed swallow initiation-pyriform sinuses;Pharyngeal residue - pyriform Pharyngeal -- Pharyngeal Comment --  CHL IP CERVICAL ESOPHAGEAL PHASE 07/09/2018 Cervical Esophageal Phase WFL Pudding Teaspoon -- Pudding Cup -- Honey Teaspoon -- Honey Cup -- Nectar Teaspoon -- Nectar Cup -- Nectar Straw -- Thin Teaspoon -- Thin Cup -- Thin Straw -- Puree -- Mechanical Soft -- Regular -- Multi-consistency -- Pill -- Cervical Esophageal Comment -- Dannial Monarch 07/09/2018, 10:13 AM    Sonia Baller, MA, CCC-SLP Speech Therapy [facility]            Labs:  CBC: Recent Labs    07/08/18 0556 07/10/18 0859 07/07/2018 2012 07/14/18 0538  WBC 9.7 8.3 26.9* 21.6*  HGB 9.7* 10.2*  12.5 10.6*  HCT 32.7* 35.2* 39.8 35.6*  PLT 212 283 405* 307    COAGS: Recent Labs    05/10/18 0715  INR 1.3*    BMP: Recent Labs    07/10/18 0859 06/28/2018 0752 07/10/2018 2012 07/14/18 0538  NA 144 144 143 146*  K 3.2* 4.1 3.9 2.7*  CL 117* 119* 105 111  CO2 19* 16* 18* 19*  GLUCOSE 213* 116* 542* 396*  BUN 33* 30* 36* 43*  CALCIUM 9.0 8.9 9.3 8.5*  CREATININE 0.80 0.73 1.75* 1.82*  GFRNONAA >60 >60 27* 26*  GFRAA >60 >60 32* 30*    LIVER FUNCTION TESTS: Recent Labs    07/06/18 1811 07/10/18 0859 07/08/2018 2012 07/14/18 0538  BILITOT 0.8 0.7 2.0* 0.8  AST 15 16 17 23   ALT 18 18 18 20   ALKPHOS 72 60 86 67  PROT 7.6 6.3* 7.3 6.3*  ALBUMIN 3.4* 3.0* 3.3* 2.7*     Assessment and Plan:  Cholecystitis. Plan for image-guided percutaneous cholecystostomy tube placement today with Dr. Vernard Gambles. Patient is NPO. Afebrile. She does not  take blood thinners. INR 1.5 today.  Consent obtained from patient's daughter, [name], via telephone.  Risks and benefits discussed with the patient including, but not limited to bleeding, infection, gallbladder perforation, bile leak, sepsis or even death. All of the patient's questions were answered, patient is agreeable to proceed. Consent signed and in chart.   Thank you for this interesting consult.  I greatly enjoyed meeting [name] and look forward to participating in their care.  A copy of this report was sent to the requesting provider on this date.  Electronically Signed: Earley Abide, PA-C 07/14/2018, 9:06 AM   I spent a total of 40 Minutes in face to face in clinical consultation, greater than 50% of which was counseling/coordinating care for cholecystitis/percutaneous cholecystostomy drain placement.

## 2018-07-14 NOTE — Progress Notes (Signed)
PROGRESS NOTE    [name]  LJQ:[id number] DOB: December 18, 1940 DOA: 06/29/2018 PCP: [name], MD   Brief Narrative: Patient is a32 year old female with history of advanced dementia, diabetes mellitus type 2, hypertension, recurrent UTIs, dysphagia, indwelling Foley catheter at baseline who was brought to the emergency department with complaints of altered mental status.  She was complaining of abdomen pain at home, was vomiting.  She was recently admitted on 07/06/2018 and was discharged on 07/06/2018 after being treated for severe sepsis secondary to catheter associated urinary tract infection and was treated with IV antibiotics. CT abdomen/pelvis done on presentation showed dilated gallbladder with gallstones and features highly suspicious for acute cholecystitis.  General surgery consulted.  IR consulted for percutaneous drain/cholecystostomy today.  Started on antibiotics.  Also suspected to be in DKA on presentation and started on insulin drip.  This morning she was hypotensive and tachycardic.  Assessment & Plan:   Active Problems:   Sepsis ([place])  Severe sepsis:Secondary to acute cholecystitis.Presents with fever, hypotension, tachycardia, leukocytosis, lactic acidosis.  Continue broad-spectrum antibiotics.  Follow-up cultures.  Continue IV fluids  Acute cholecystitis: CT scan showed dilated gallbladder with gallstones, considerable pericholecystitic inflammatory changes consistent with acute cholecystitis . General surgery consulted.  IR consulted for percutaneous drain/cholecystostomy today.  Continue n.p.o. status.  Continue IV fluids, antibiotics   Metabolic encephalopathy: From sepsis .Patient has underlying dementia.  Anion gap metabolic acidosis: Likely from lactic acidosis but glucose was also elevated at 542.  Details suspected.  Started on insulin drip.  Monitor BMP every 4 hours.  Continue fluids.  Acute kidney injury:Patient baseline creatinine 0.7, she presented  with creatinine of 1.75 on admission.Continue IV fluids  Dysphagia:On last admission,speech therapy was consulted for dysphagia, patient started on dysphagia 1 diet.  Hypertension: Hypotensive at present.  Hold antihypertensives  Diabetes mellitus type 2: On metformin at home.  Currently in likely DKA.  Continue insulin.            DVT prophylaxis:ScD Code Status: DNR Family Communication: Discussed with the daughter on phone Disposition Plan: Undetermined at this point   Consultants: General surgery, IR  Procedures: Cholecystostomy  Antimicrobials:  Anti-infectives (From admission, onward)   Start     Dose/Rate Route Frequency Ordered Stop   07/15/18 2200  vancomycin (VANCOCIN) 1,500 mg in sodium chloride 0.9 % 500 mL IVPB     1,500 mg 250 mL/hr over 120 Minutes Intravenous Every 48 hours 07/14/18 0506     07/14/18 2000  ceFEPIme (MAXIPIME) 2 g in sodium chloride 0.9 % 100 mL IVPB     2 g 200 mL/hr over 30 Minutes Intravenous Every 24 hours 07/14/18 0506     07/14/18 0600  metroNIDAZOLE (FLAGYL) IVPB 500 mg     500 mg 100 mL/hr over 60 Minutes Intravenous Every 8 hours 07/14/18 0117     07/10/2018 2345  vancomycin (VANCOCIN) IVPB 750 mg/150 ml premix     750 mg 150 mL/hr over 60 Minutes Intravenous  Once 06/25/2018 2335 07/14/18 0234   07/17/2018 2000  ceFEPIme (MAXIPIME) 2 g in sodium chloride 0.9 % 100 mL IVPB     2 g 200 mL/hr over 30 Minutes Intravenous  Once 07/01/2018 1956 07/17/2018 2129   07/18/2018 2000  metroNIDAZOLE (FLAGYL) IVPB 500 mg     500 mg 100 mL/hr over 60 Minutes Intravenous  Once 07/17/2018 1956 07/05/2018 2223   06/29/2018 2000  vancomycin (VANCOCIN) IVPB 1000 mg/200 mL premix     1,000 mg 200 mL/hr  over 60 Minutes Intravenous  Once 07/21/2018 1956 07/07/2018 2331      Subjective: Patient seen and examined the bedside this morning.  Appears weak.  She has baseline dementia so does not communicate that much.  Hypotensive and tachycardic.  Denies any  abdominal pain.  Objective: Vitals:   07/14/18 1005 07/14/18 1006 07/14/18 1011 07/14/18 1040  BP: (!) 68/54 (!) 64/48 99/84 129/64  Pulse: (!) 130 (!) 113 (!) 117 94  Resp: (!) 25 (!) 30 (!) 38 (!) 32  Temp:      TempSrc:      SpO2: 98% 100% 100% 96%  Weight:      Height:        Intake/Output Summary (Last 24 hours) at 07/14/2018 1127 Last data filed at 07/14/2018 1045 Gross per 24 hour  Intake 4975.43 ml  Output 51 ml  Net 4924.43 ml   Filed Weights   07/14/18 0119  Weight: 81.9 kg    Examination:  General exam: Sick, weak, debilitated  HEENT:PERRL,Oral mucosa dry , Ear/Nose normal on gross exam Respiratory system: Bilateral equal air entry, normal vesicular breath sounds, no wheezes or crackles  Cardiovascular system: S1 & S2 heard, RRR. No JVD, murmurs, rubs, gallops or clicks. No pedal edema. Gastrointestinal system: Abdomen is nondistended, soft and nontender. No organomegaly or masses felt. Normal bowel sounds heard. Central nervous system: Not Alert and oriented.  Extremities: No edema, no clubbing ,no cyanosis, distal peripheral pulses palpable. Skin: Some skin breakdowns.No rashes, lesions,no icterus ,no pallor GU: Foley    Data Reviewed: I have personally reviewed following labs and imaging studies  CBC: Recent Labs  Lab 07/08/18 0556 07/10/18 0859 07/07/2018 2012 07/14/18 0538  WBC 9.7 8.3 26.9* 21.6*  NEUTROABS  --   --  24.7*  --   HGB 9.7* 10.2* 12.5 10.6*  HCT 32.7* 35.2* 39.8 35.6*  MCV 84.1 88.4 83.4 86.0  PLT 212 283 405* 008   Basic Metabolic Panel: Recent Labs  Lab 07/10/18 0859 07/19/2018 0752 07/01/2018 2012 07/14/18 0538 07/14/18 0829  NA 144 144 143 146* 135  K 3.2* 4.1 3.9 2.7* 14.3*  CL 117* 119* 105 111 113*  CO2 19* 16* 18* 19* 20*  GLUCOSE 213* 116* 542* 396* 178*  BUN 33* 30* 36* 43* 42*  CREATININE 0.80 0.73 1.75* 1.82* 1.61*  CALCIUM 9.0 8.9 9.3 8.5* 7.4*   GFR: Estimated Creatinine Clearance: 31.2 mL/min (A) (by C-G  formula based on SCr of 1.61 mg/dL (H)). Liver Function Tests: Recent Labs  Lab 07/10/18 0859 07/02/2018 2012 07/14/18 0538  AST 16 17 23   ALT 18 18 20   ALKPHOS 60 86 67  BILITOT 0.7 2.0* 0.8  PROT 6.3* 7.3 6.3*  ALBUMIN 3.0* 3.3* 2.7*   No results for input(s): LIPASE, AMYLASE in the last 168 hours. No results for input(s): AMMONIA in the last 168 hours. Coagulation Profile: Recent Labs  Lab 07/14/18 0829  INR 1.5*   Cardiac Enzymes: No results for input(s): CKTOTAL, CKMB, CKMBINDEX, TROPONINI in the last 168 hours. BNP (last 3 results) No results for input(s): PROBNP in the last 8760 hours. HbA1C: No results for input(s): HGBA1C in the last 72 hours. CBG: Recent Labs  Lab 07/14/18 0618 07/14/18 0728 07/14/18 0829 07/14/18 0912 07/14/18 1042  GLUCAP 311* 249* 168* 177* 111*   Lipid Profile: No results for input(s): CHOL, HDL, LDLCALC, TRIG, CHOLHDL, LDLDIRECT in the last 72 hours. Thyroid Function Tests: No results for input(s): TSH, T4TOTAL, FREET4, T3FREE, THYROIDAB in  the last 72 hours. Anemia Panel: No results for input(s): VITAMINB12, FOLATE, FERRITIN, TIBC, IRON, RETICCTPCT in the last 72 hours. Sepsis Labs: Recent Labs  Lab 06/27/2018 2012 07/14/2018 2333  LATICACIDVEN 4.0* 1.9    Recent Results (from the past 240 hour(s))  Blood culture (routine x 2)     Status: None   Collection Time: 07/06/18  6:06 PM  Result Value Ref Range Status   Specimen Description BLOOD RIGHT HAND  Final   Special Requests   Final    BOTTLES DRAWN AEROBIC AND ANAEROBIC Blood Culture results may not be optimal due to an inadequate volume of blood received in culture bottles   Culture   Final    NO GROWTH 5 DAYS Performed at [facility], [place] [address], [place], [place] [address]    Report Status 06/24/2018 FINAL  Final  Blood culture (routine x 2)     Status: Abnormal (Preliminary result)   Collection Time: 07/06/18  6:13 PM  Result Value Ref Range Status   Specimen  Description BLOOD LEFT HAND  Final   Special Requests   Final    BOTTLES DRAWN AEROBIC AND ANAEROBIC Blood Culture adequate volume   Culture  Setup Time   Final    ANAEROBIC BOTTLE ONLY GRAM POSITIVE COCCI IN PAIRS RESULT CALLED TO, READ BACK BY AND VERIFIED WITH: BETH GREENE @ 7591 ON 07/08/18 BY ROBINSON Z.  AEROBIC BOTTLE ONLY GRAM POSITIVE RODS CRITICAL RESULT CALLED TO, READ BACK BY AND VERIFIED WITH: J GRIMSLEY PHARMD 07/10/18 2340 JDW    Culture (A)  Final    STAPHYLOCOCCUS SPECIES (COAGULASE NEGATIVE) THE SIGNIFICANCE OF ISOLATING THIS ORGANISM FROM A SINGLE SET OF BLOOD CULTURES WHEN MULTIPLE SETS ARE DRAWN IS UNCERTAIN. PLEASE NOTIFY THE MICROBIOLOGY DEPARTMENT WITHIN ONE WEEK IF SPECIATION AND SENSITIVITIES ARE REQUIRED. DIPHTHEROIDS(CORYNEBACTERIUM SPECIES) Standardized susceptibility testing for this organism is not available. Performed at [facility], [place] [address] [place], [place] [address]    Report Status PENDING  Incomplete  Blood Culture ID Panel (Reflexed)     Status: Abnormal   Collection Time: 07/06/18  6:13 PM  Result Value Ref Range Status   Enterococcus species NOT DETECTED NOT DETECTED Final   Listeria monocytogenes NOT DETECTED NOT DETECTED Final   Staphylococcus species DETECTED (A) NOT DETECTED Final    Comment: Methicillin (oxacillin) resistant coagulase negative staphylococcus. Possible blood culture contaminant (unless isolated from more than one blood culture draw or clinical case suggests pathogenicity). No antibiotic treatment is indicated for blood  culture contaminants. CRITICAL RESULT CALLED TO, READ BACK BY AND VERIFIED WITH: BETH GREENE @ 0056 ON 07/08/18 BY ROBINSON Z.     Staphylococcus aureus (BCID) NOT DETECTED NOT DETECTED Final   Methicillin resistance DETECTED (A) NOT DETECTED Final    Comment: CRITICAL RESULT CALLED TO, READ BACK BY AND VERIFIED WITH: BETH GREENE @ 0056 ON 07/08/18 BY ROBINSON Z.     Streptococcus species NOT  DETECTED NOT DETECTED Final   Streptococcus agalactiae NOT DETECTED NOT DETECTED Final   Streptococcus pneumoniae NOT DETECTED NOT DETECTED Final   Streptococcus pyogenes NOT DETECTED NOT DETECTED Final   Acinetobacter baumannii NOT DETECTED NOT DETECTED Final   Enterobacteriaceae species NOT DETECTED NOT DETECTED Final   Enterobacter cloacae complex NOT DETECTED NOT DETECTED Final   Escherichia coli NOT DETECTED NOT DETECTED Final   Klebsiella oxytoca NOT DETECTED NOT DETECTED Final   Klebsiella pneumoniae NOT DETECTED NOT DETECTED Final   Proteus species NOT DETECTED  NOT DETECTED Final   Serratia marcescens NOT DETECTED NOT DETECTED Final   Haemophilus influenzae NOT DETECTED NOT DETECTED Final   Neisseria meningitidis NOT DETECTED NOT DETECTED Final   Pseudomonas aeruginosa NOT DETECTED NOT DETECTED Final   Candida albicans NOT DETECTED NOT DETECTED Final   Candida glabrata NOT DETECTED NOT DETECTED Final   Candida krusei NOT DETECTED NOT DETECTED Final   Candida parapsilosis NOT DETECTED NOT DETECTED Final   Candida tropicalis NOT DETECTED NOT DETECTED Final    Comment: Performed at [facility], [place] [address], [place], [place] [address]  Urine culture     Status: Abnormal   Collection Time: 07/06/18  7:43 PM  Result Value Ref Range Status   Specimen Description   Final    URINE, CLEAN CATCH Performed at [facility], [place] [address] [place], [place] [address]    Special Requests   Final    NONE Performed at [facility], [place] [address], [place], [place] [address]    Culture (A)  Final    >=100,000 COLONIES/mL MULTIPLE SPECIES PRESENT, SUGGEST RECOLLECTION   Report Status 07/08/2018 FINAL  Final  SARS Coronavirus 2 (CEPHEID - Performed in [place] hospital lab), Hosp Order     Status: None   Collection Time: 07/06/18  8:11 PM  Result Value Ref Range Status   SARS Coronavirus 2 NEGATIVE NEGATIVE Final    Comment: (NOTE) If  result is NEGATIVE SARS-CoV-2 target nucleic acids are NOT DETECTED. The SARS-CoV-2 RNA is generally detectable in upper and lower  respiratory specimens during the acute phase of infection. The lowest  concentration of SARS-CoV-2 viral copies this assay can detect is 250  copies / mL. A negative result does not preclude SARS-CoV-2 infection  and should not be used as the sole basis for treatment or other  patient management decisions.  A negative result may occur with  improper specimen collection / handling, submission of specimen other  than nasopharyngeal swab, presence of viral mutation(s) within the  areas targeted by this assay, and inadequate number of viral copies  (<250 copies / mL). A negative result must be combined with clinical  observations, patient history, and epidemiological information. If result is POSITIVE SARS-CoV-2 target nucleic acids are DETECTED. The SARS-CoV-2 RNA is generally detectable in upper and lower  respiratory specimens dur ing the acute phase of infection.  Positive  results are indicative of active infection with SARS-CoV-2.  Clinical  correlation with patient history and other diagnostic information is  necessary to determine patient infection status.  Positive results do  not rule out bacterial infection or co-infection with other viruses. If result is PRESUMPTIVE POSTIVE SARS-CoV-2 nucleic acids MAY BE PRESENT.   A presumptive positive result was obtained on the submitted specimen  and confirmed on repeat testing.  While 2019 novel coronavirus  (SARS-CoV-2) nucleic acids may be present in the submitted sample  additional confirmatory testing may be necessary for epidemiological  and / or clinical management purposes  to differentiate between  SARS-CoV-2 and other Sarbecovirus currently known to infect humans.  If clinically indicated additional testing with an alternate test  methodology [phone]) is advised. The SARS-CoV-2 RNA is generally    detectable in upper and lower respiratory sp ecimens during the acute  phase of infection. The expected result is Negative. Fact Sheet for Patients:  StrictlyIdeas.no Fact Sheet for Healthcare Providers: BankingDealers.co.za This test is not yet approved or cleared by the [place] FDA and  has been authorized for detection and/or diagnosis of SARS-CoV-2 by FDA under an Emergency Use Authorization (EUA).  This EUA will remain in effect (meaning this test can be used) for the duration of the COVID-19 declaration under Section 564(b)(1) of the Act, 21 U.S.C. section 360bbb-3(b)(1), unless the authorization is terminated or revoked sooner. Performed at [facility], [place] [address] [place], [place] [address]   SARS Coronavirus 2 (CEPHEID- Performed in [place] hospital lab), Hosp Order     Status: None   Collection Time: 07/21/2018 11:33 PM  Result Value Ref Range Status   SARS Coronavirus 2 NEGATIVE NEGATIVE Final    Comment: (NOTE) If result is NEGATIVE SARS-CoV-2 target nucleic acids are NOT DETECTED. The SARS-CoV-2 RNA is generally detectable in upper and lower  respiratory specimens during the acute phase of infection. The lowest  concentration of SARS-CoV-2 viral copies this assay can detect is 250  copies / mL. A negative result does not preclude SARS-CoV-2 infection  and should not be used as the sole basis for treatment or other  patient management decisions.  A negative result may occur with  improper specimen collection / handling, submission of specimen other  than nasopharyngeal swab, presence of viral mutation(s) within the  areas targeted by this assay, and inadequate number of viral copies  (<250 copies / mL). A negative result must be combined with clinical  observations, patient history, and epidemiological information. If result is POSITIVE SARS-CoV-2 target nucleic acids are DETECTED. The  SARS-CoV-2 RNA is generally detectable in upper and lower  respiratory specimens dur ing the acute phase of infection.  Positive  results are indicative of active infection with SARS-CoV-2.  Clinical  correlation with patient history and other diagnostic information is  necessary to determine patient infection status.  Positive results do  not rule out bacterial infection or co-infection with other viruses. If result is PRESUMPTIVE POSTIVE SARS-CoV-2 nucleic acids MAY BE PRESENT.   A presumptive positive result was obtained on the submitted specimen  and confirmed on repeat testing.  While 2019 novel coronavirus  (SARS-CoV-2) nucleic acids may be present in the submitted sample  additional confirmatory testing may be necessary for epidemiological  and / or clinical management purposes  to differentiate between  SARS-CoV-2 and other Sarbecovirus currently known to infect humans.  If clinically indicated additional testing with an alternate test  methodology [phone]) is advised. The SARS-CoV-2 RNA is generally  detectable in upper and lower respiratory sp ecimens during the acute  phase of infection. The expected result is Negative. Fact Sheet for Patients:  StrictlyIdeas.no Fact Sheet for Healthcare Providers: BankingDealers.co.za This test is not yet approved or cleared by the [place] FDA and has been authorized for detection and/or diagnosis of SARS-CoV-2 by FDA under an Emergency Use Authorization (EUA).  This EUA will remain in effect (meaning this test can be used) for the duration of the COVID-19 declaration under Section 564(b)(1) of the Act, 21 U.S.C. section 360bbb-3(b)(1), unless the authorization is terminated or revoked sooner. Performed at [facility], [place] [address], [place], [place] [address]   MRSA PCR Screening     Status: None   Collection Time: 07/14/18  3:25 AM  Result Value Ref Range  Status   MRSA by PCR NEGATIVE NEGATIVE Final    Comment:        The GeneXpert MRSA Assay (FDA approved for NASAL specimens only), is one component of a comprehensive MRSA colonization surveillance program. It is  not intended to diagnose MRSA infection nor to guide or monitor treatment for MRSA infections. Performed at [facility], [place] [address], [place], [place] [address]          Radiology Studies: Ct Abdomen Pelvis Wo Contrast  Result Date: 07/19/2018 CLINICAL DATA:  History of recent UTI with generalized abdominal pain EXAM: CT ABDOMEN AND PELVIS WITHOUT CONTRAST TECHNIQUE: Multidetector CT imaging of the abdomen and pelvis was performed following the standard protocol without IV contrast. COMPARISON:  08/10/2016 FINDINGS: Lower chest: Mild scarring is noted in the bases bilaterally. No focal infiltrate or effusion is seen. Hepatobiliary: Gallbladder is significantly distended with pericholecystic inflammatory change and dependent cholelithiasis. These changes are consistent with acute cholecystitis till proven otherwise. Ultrasound may be helpful for further evaluation. Pancreas: Unremarkable. No pancreatic ductal dilatation or surrounding inflammatory changes. Spleen: Normal in size without focal abnormality. Adrenals/Urinary Tract: Adrenal glands are within normal limits bilaterally. Kidneys are unremarkable. No renal calculi or obstructive changes are noted. The bladder is decompressed by Foley catheter. Stomach/Bowel: Contrast is noted throughout the large bowel. No obstructive or inflammatory changes are seen. The appendix is not well visualized although no inflammatory changes are seen. Stomach is distended with fluid with evidence of reflux into the distal esophagus. No small bowel abnormality is noted. Vascular/Lymphatic: Aortic atherosclerosis. No enlarged abdominal or pelvic lymph nodes. Reproductive: Status post hysterectomy. No adnexal masses. Other: No  abdominal wall hernia or abnormality. No abdominopelvic ascites. Musculoskeletal: Right hip prosthesis is noted. Degenerative changes of lumbar spine are noted. Prior vertebral augmentation at L3 is noted. No acute abnormality is seen. IMPRESSION: Significantly dilated gallbladder with gallstones within. Considerable peri cholecystic inflammatory changes are noted consistent with acute cholecystitis. Right upper quadrant ultrasound may be helpful for further evaluation. Chronic changes as described above. Electronically Signed   By: Inez Catalina M.D.   On: 07/06/2018 20:47   Ct Head Wo Contrast  Result Date: 07/10/2018 CLINICAL DATA:  Altered mental status, history of UTIs EXAM: CT HEAD WITHOUT CONTRAST TECHNIQUE: Contiguous axial images were obtained from the base of the skull through the vertex without intravenous contrast. COMPARISON:  04/19/2018 FINDINGS: Brain: Chronic atrophic changes are noted with ventriculomegaly. Changes of chronic white matter ischemic change are again noted. No findings to suggest acute hemorrhage, acute infarction or space-occupying mass lesion are noted. Vascular: No hyperdense vessel or unexpected calcification. Skull: Normal. Negative for fracture or focal lesion. Sinuses/Orbits: No acute finding. Other: None. IMPRESSION: Atrophic and ischemic changes without acute abnormality. Electronically Signed   By: Inez Catalina M.D.   On: 07/02/2018 20:41   Dg Chest Port 1 View  Result Date: 06/28/2018 CLINICAL DATA:  Altered mental status, history of recent UTI, possible sepsis EXAM: PORTABLE CHEST 1 VIEW COMPARISON:  07/06/2018 FINDINGS: The heart size and mediastinal contours are within normal limits. Both lungs are clear. The visualized skeletal structures are unremarkable. IMPRESSION: No active disease. Electronically Signed   By: Inez Catalina M.D.   On: 06/26/2018 20:48        Scheduled Meds:  lidocaine       mouth rinse  15 mL Mouth Rinse BID   sodium chloride flush   5 mL Intracatheter Q8H   Continuous Infusions:  sodium chloride Stopped (07/14/18 0800)   ceFEPime (MAXIPIME) IV     dextrose 5 % and 0.45% NaCl 100 mL/hr at 07/14/18 1045   insulin 2 Units/hr (07/14/18 1045)   metronidazole Stopped (07/14/18 0710)   potassium chloride Stopped (07/14/18  1118)   [START ON 07/15/2018] vancomycin       LOS: 1 day    Time spent: 35 mins.More than 50% of that time was spent in counseling and/or coordination of care.      Shelly Coss, MD [facility] Pager [phone]  If 7PM-7AM, please contact night-coverage [email or web address] Password [facility] 07/14/2018, 11:27 AM

## 2018-07-14 NOTE — Procedures (Signed)
  Procedure: Perc Cholecystostomy catheter placement 81f EBL:   minimal Complications:  none immediate  See full dictation in [facility].  Dillard Cannon MD Main # [phone] Pager  [phone]

## 2018-07-14 NOTE — ED Notes (Signed)
Attempted to call report to [place], RN  ICU. Stated on break, and would call back shortly for report

## 2018-07-14 NOTE — H&P (Addendum)
History and Physical    [name] GNF:[id number] DOB: [birth year] DOA: 06/23/2018  PCP: [name], MD   Patient coming from: Home  I have personally briefly reviewed patient's old medical records in [place]  Chief Complaint: Altered mental status  HPI: [name] is a 78 y.o. female with medical history significant for dementia, diabetes, depression, hypertension, chronic indwelling Foley catheter, was brought to the ED by family with reports of altered mental status.  Unable to obtain history from patient due to patient's mental status.  Patient appears awake, she is not responding to questions, would moan intermittently.   Talk to patient's daughter on the phone, she tells me her mother was talking when she was discharged home, and hungry but a hours after getting home she started complaining of abdominal pain, and had started vomiting unable to keep any food down.  Patient has also been bedbound since January after she had a right knee injury.  Recent hospitalization- 5/15 - 06/23/2018 for severe sepsis secondary to catheter associated UTI treated with 5-day course of IV ceftriaxone.  Urine cultures grew multiple species.  Blood cultures also grew coagulase-negative staph X 1 bottle, this was thought to be a contaminant.  ED Course: Temperature 106.1, patient placed on ice packs. Tachycardia to 142, blood pressure systolic 84-696.  O2 sats greater than 95% on nasal cannula.  UA with large leukocytes few bacteria greater than 50 WBCs.  Lactic acidosis 4.  WBC-26.9.  SARS-CoV-2 test negative.  Head CT negative for acute abnormality .  With abdominal tenderness, a abdominal CT was obtained - significantly dilated gallbladder with gallstones with seen.  Considerable pericholecystic inflammatory changes noted with acute cholecystitis.  Sepsis fluid bolus- 2.5L given.  Blood and urine cultures obtained.  IV vancomycin, metronidazole and cefepime started in ED. EDP to Dr.  Waynard Edwards with general surgery, in the morning, plans to consider percutaneous drainage of the gallbladder.  Review of Systems: Unable to review systems due to altered mental status.  Past Medical History:  Diagnosis Date   Anxiety    Dementia ([place])    Depression    Diabetes mellitus    Dyspnea    GERD (gastroesophageal reflux disease)    Headache(784.0)    HTN (hypertension)    dr Johnsie Cancel   Hypercholesteremia    DENIES   Osteoarthritis    PONV (postoperative nausea and vomiting)    RLS (restless legs syndrome)    Stress    UTI (urinary tract infection) 05/27/2014    Past Surgical History:  Procedure Laterality Date   ABDOMINAL HYSTERECTOMY     COLONOSCOPY     JOINT REPLACEMENT     left forearm fracture with ORIF Left    LUMBAR LAMINECTOMY/DECOMPRESSION MICRODISCECTOMY N/A 04/18/2012   Procedure: LUMBAR LAMINECTOMY/DECOMPRESSION MICRODISCECTOMY 3 LEVELS;  Surgeon: Winfield Cunas, MD;  Location: [facility];  Service: Neurosurgery;  Laterality: N/A;  Lumbar three-four, lumbar four-five, lumbar five-sacral one decompression. Synovial cyst resection lumbar four-five   ORIF PERIPROSTHETIC FRACTURE Right 07/06/2016   Procedure: OPEN REDUCTION INTERNAL FIXATION (ORIF) PERIPROSTHETIC FRACTURE;  Surgeon: Gaynelle Arabian, MD;  Location: [facility];  Service: Orthopedics;  Laterality: Right;   REPLACEMENT TOTAL KNEE Right    WISDOM TOOTH EXTRACTION       reports that she has never smoked. She has never used smokeless tobacco. She reports that she does not drink alcohol or use drugs.  Allergies  Allergen Reactions   Flounder [Fish Allergy] Other (See Comments)  Pt breaks out in bumps   Morphine And Related Other (See Comments)    Reaction:  Hallucinations    Oxycodone Other (See Comments)    Reaction:  Hallucinations   Sulfonamide Derivatives Swelling and Other (See Comments)    Reaction:  Unspecified swelling reaction     Family History  Problem Relation Age  of Onset   Diabetes Father    High blood pressure Father    High blood pressure Mother    Colon cancer Neg Hx    Esophageal cancer Neg Hx    Liver disease Neg Hx    Stomach cancer Neg Hx    Pancreatic cancer Neg Hx     Prior to Admission medications   Medication Sig Start Date End Date Taking? Authorizing Provider  acetaminophen (TYLENOL) 500 MG tablet Take 500 mg by mouth every 8 (eight) hours as needed for mild pain.   Yes [provider]  ALPRAZolam (XANAX) 0.25 MG tablet Take 1 tablet (0.25 mg total) by mouth at bedtime as needed for anxiety. 05/24/18  Yes Mariel Aloe, MD  aspirin 325 MG EC tablet Take 325 mg by mouth daily.   Yes [provider]  Infant Care Products (DERMACLOUD) CREA Apply 1 application topically as needed (each change).   Yes [provider]  loratadine-pseudoephedrine (CLARITIN-D 12-HOUR) 5-120 MG tablet Take 1 tablet by mouth 2 (two) times daily as needed for allergies.   Yes [provider]  Magnesium Oxide 500 MG TABS Take 500 mg by mouth daily.    Yes [provider]  metFORMIN (GLUCOPHAGE) 500 MG tablet Take 500-1,000 mg by mouth See admin instructions. Take 1,000mg  by mouth in morning, 500mg  by mouth at lunch, and 500mg  by mouth in evening   Yes [provider]  omeprazole (PRILOSEC) 40 MG capsule Take 40 mg by mouth daily. 03/09/18  Yes [provider]  ondansetron (ZOFRAN) 4 MG tablet Take 4 mg by mouth every 6 (six) hours as needed for nausea/vomiting. 02/13/18  Yes [provider]  pramipexole (MIRAPEX) 1.5 MG tablet Take 1.5 mg by mouth 3 (three) times daily.   Yes [provider]    Physical Exam: Vitals:   06/26/2018 2040 06/30/2018 2046 07/22/2018 2200  BP: (!) 108/58 (!) 108/58   Pulse: (!) 140 (!) 142 (!) 132  Resp: (!) 39 (!) 40 (!) 46  Temp:  (!) 106.1 F (41.2 C)   TempSrc:  Rectal   SpO2: 96% 95% 99%    Constitutional: Calm, Awake, but not responding to  questions. Vitals:   06/26/2018 2040 06/24/2018 2046 07/18/2018 2200  BP: (!) 108/58 (!) 108/58   Pulse: (!) 140 (!) 142 (!) 132  Resp: (!) 39 (!) 40 (!) 46  Temp:  (!) 106.1 F (41.2 C)   TempSrc:  Rectal   SpO2: 96% 95% 99%   Eyes: PERRL, lids and conjunctivae normal ENMT: Mucous membranes are dry. Posterior pharynx clear of any exudate or lesions. Neck: normal, supple, no masses, no thyromegaly Respiratory: clear to auscultation bilaterally, no wheezing, no crackles. Normal respiratory effort. No accessory muscle use.  Cardiovascular: Tachycardia, regular rate and rhythm. No extremity edema. 2+ pedal pulses. No carotid bruits.  Abdomen: no tenderness, no masses palpated. No hepatosplenomegaly. Bowel sounds positive.  Musculoskeletal: no clubbing / cyanosis. No joint deformity upper and lower extremities. Good ROM, no contractures. Normal muscle tone.  Skin: no rashes, lesions, ulcers. No induration Neurologic: Unable to fully examine due to mental status.  Some moaning on exam to pain otherwise patient not responding verbally.  Patient flexes right upper extremity to pain, patient's nurse in ED reports patient was moving left upper extremity but this was not evident on my exam, she is also not moving her bilateral lower extremities.  Plantar flexion of feet of bilateral lower extremity, no response to plantar stimulation. Psychiatric: Unable To fully examine  Labs on Admission: I have personally reviewed following labs and imaging studies  CBC: Recent Labs  Lab 07/07/18 0706 07/08/18 0556 07/10/18 0859 06/26/2018 2012  WBC 11.9* 9.7 8.3 26.9*  NEUTROABS  --   --   --  24.7*  HGB 10.5* 9.7* 10.2* 12.5  HCT 36.2 32.7* 35.2* 39.8  MCV 88.3 84.1 88.4 83.4  PLT 288 212 283 627*   Basic Metabolic Panel: Recent Labs  Lab 07/08/18 0556 07/09/18 1814 07/10/18 0859 07/14/2018 0752 06/29/2018 2012  NA 151* 148* 144 144 143  K 3.7 3.2* 3.2* 4.1 3.9  CL 123* 120* 117* 119* 105  CO2 19* 19*  19* 16* 18*  GLUCOSE 228* 174* 213* 116* 542*  BUN 56* 42* 33* 30* 36*  CREATININE 1.09* 0.98 0.80 0.73 1.75*  CALCIUM 9.3 9.2 9.0 8.9 9.3   Liver Function Tests: Recent Labs  Lab 07/10/18 0859 07/17/2018 2012  AST 16 17  ALT 18 18  ALKPHOS 60 86  BILITOT 0.7 2.0*  PROT 6.3* 7.3  ALBUMIN 3.0* 3.3*   CBG: Recent Labs  Lab 07/10/18 1758 07/10/18 2110 07/14/2018 0749 07/01/2018 1139 06/26/2018 1639  GLUCAP 136* 113* 113* 181* 170*   Urine analysis:    Component Value Date/Time   COLORURINE YELLOW 07/02/2018 2012   APPEARANCEUR CLOUDY (A) 07/18/2018 2012   LABSPEC 1.015 07/12/2018 2012   PHURINE 6.0 07/21/2018 2012   GLUCOSEU >=500 (A) 06/26/2018 2012   HGBUR MODERATE (A) 07/20/2018 2012   BILIRUBINUR NEGATIVE 07/18/2018 2012   KETONESUR 20 (A) 07/03/2018 2012   PROTEINUR 100 (A) 06/29/2018 2012   UROBILINOGEN 0.2 07/20/2014 2228   NITRITE NEGATIVE 06/28/2018 2012   LEUKOCYTESUR LARGE (A) 06/29/2018 2012    Radiological Exams on Admission: Ct Abdomen Pelvis Wo Contrast  Result Date: 06/30/2018 CLINICAL DATA:  History of recent UTI with generalized abdominal pain EXAM: CT ABDOMEN AND PELVIS WITHOUT CONTRAST TECHNIQUE: Multidetector CT imaging of the abdomen and pelvis was performed following the standard protocol without IV contrast. COMPARISON:  08/10/2016 FINDINGS: Lower chest: Mild scarring is noted in the bases bilaterally. No focal infiltrate or effusion is seen. Hepatobiliary: Gallbladder is significantly distended with pericholecystic inflammatory change and dependent cholelithiasis. These changes are consistent with acute cholecystitis till proven otherwise. Ultrasound may be helpful for further evaluation. Pancreas: Unremarkable. No pancreatic ductal dilatation or surrounding inflammatory changes. Spleen: Normal in size without focal abnormality. Adrenals/Urinary Tract: Adrenal glands are within normal limits bilaterally. Kidneys are unremarkable. No renal calculi or  obstructive changes are noted. The bladder is decompressed by Foley catheter. Stomach/Bowel: Contrast is noted throughout the large bowel. No obstructive or inflammatory changes are seen. The appendix is not well visualized although no inflammatory changes are seen. Stomach is distended with fluid with evidence of reflux into the distal esophagus. No small bowel abnormality is noted. Vascular/Lymphatic: Aortic atherosclerosis. No enlarged abdominal or pelvic lymph nodes. Reproductive: Status post hysterectomy. No adnexal masses. Other: No abdominal wall hernia or abnormality. No abdominopelvic ascites. Musculoskeletal: Right hip prosthesis is noted. Degenerative changes of lumbar spine are noted. Prior vertebral augmentation at L3 is noted.  No acute abnormality is seen. IMPRESSION: Significantly dilated gallbladder with gallstones within. Considerable peri cholecystic inflammatory changes are noted consistent with acute cholecystitis. Right upper quadrant ultrasound may be helpful for further evaluation. Chronic changes as described above. Electronically Signed   By: Inez Catalina M.D.   On: 07/08/2018 20:47   Ct Head Wo Contrast  Result Date: 07/22/2018 CLINICAL DATA:  Altered mental status, history of UTIs EXAM: CT HEAD WITHOUT CONTRAST TECHNIQUE: Contiguous axial images were obtained from the base of the skull through the vertex without intravenous contrast. COMPARISON:  04/19/2018 FINDINGS: Brain: Chronic atrophic changes are noted with ventriculomegaly. Changes of chronic white matter ischemic change are again noted. No findings to suggest acute hemorrhage, acute infarction or space-occupying mass lesion are noted. Vascular: No hyperdense vessel or unexpected calcification. Skull: Normal. Negative for fracture or focal lesion. Sinuses/Orbits: No acute finding. Other: None. IMPRESSION: Atrophic and ischemic changes without acute abnormality. Electronically Signed   By: Inez Catalina M.D.   On: 07/20/2018 20:41    Dg Chest Port 1 View  Result Date: 07/15/2018 CLINICAL DATA:  Altered mental status, history of recent UTI, possible sepsis EXAM: PORTABLE CHEST 1 VIEW COMPARISON:  07/06/2018 FINDINGS: The heart size and mediastinal contours are within normal limits. Both lungs are clear. The visualized skeletal structures are unremarkable. IMPRESSION: No active disease. Electronically Signed   By: Inez Catalina M.D.   On: 06/22/2018 20:48    EKG: Independently reviewed. Sinus tachycardia, QTC 512.  Unchanged  T wave abnormalities.  Assessment/Plan Active Problems:   Sepsis ([facility])    Severe sepsis- tachycardic 140s, febrile to 106, leukocytosis 26.9.  Blood pressure systolic now 29/93.  Lactic acidosis 4 >>1.9.  Likely 2/2 acute cholecystitis and UTI.  Abdominal CT-significant dilated gallbladder with gallstone within, considerable pericholecystic inflammatory changes consistent with acute cholecystitis.  Normal lipase and liver enzymes.  UA with large leukocytes, > 50 WBCs.  Patient with chronic indwelling Foley cath.  Recent hospitalization for CAUTI-urine cultures grew multiple species. Foley cath was changed 5/20. -Continue broad-spectrum antibiotics vancomycin cefepime and metronidazole - IVF N/s 125cc/hr  - CMP, BMP a.m -Follow-up blood and urine cultures - Addendum- called that patient is having runs of V. Tach, blood pressure dropping to 71/34, 2ml urine output since last night.  Will get EKG, 561ml bolus, flush Foley catheter.  Anion gap metabolic acidosis- likely multifactorial from lactic acidosis 4, acute kidney injury and possibly DKA as her glucose is elevated at 542.  Serum bicarb 18.  Anion gap 20.  Lactic acid has trended down. -Start DKA protocol with insulin GTT  -Hydrate -BMP  q4h x 2   Acute Kidney injury-1.75, baseline 0.7-0.6.  BUN 36.  Likely secondary to dehydration and sepsis. - N/s 125cc/hr - Monitor BMP  Metabolic encephalopathy- likely secondary to sepsis and UTI and acute  cholecystitis.  Head CT negative for acute abnormality.  Baseline dementia.  On exam she is not moving her left upper and bilateral lower extremities, appears awake but not responding to questions, will sometimes moan on exam.  Per chart review patient is bed bound with severe dementia and only speaks a few words. -IV antibiotics -F/u urine and blood cultures -N. P.o. - EEG  DM2- possibly in DKA, likely provoked by sepsis.  Glucose 542.  With anion gap 20 and bicarb 18.  Other metabolic abnormalities also likely contributing. -Hold Home metformin - DKA protocol  HTN- Systolic blood pressure 71-696V.  Likely due to sepsis.  Does not appear  patient's home medications amlodipine and losartan were restarted on discharge.  These were held on recent admission.  Dementia- Per chart review patient is bed bound with severe dementia and only speaks a few words.   DVT prophylaxis: SCDs Code Status: DNR re-confirmed with daughter on the phone. Family Communication: None at bedside.  Talked to Daughter Mashalle on the phone, expressing concern that patient is very ill with multiple organ dysfunction. Disposition Plan: Per rounding team Consults called: Gen Surg Admission status: Inpatient step down I certify that at the point of admission it is my clinical judgment that the patient will require inpatient hospital care spanning beyond 2 midnights from the point of admission due to high intensity of service, high risk for further deterioration and high frequency of surveillance required. The following factors support the patient status of inpatient: Severe sepsis with lactic acidosis from UTI acute cholecystitis, requiring IV antibiotics and in DKA requiring insulin drip and stepdown level of care.   Bethena Roys MD [facility]  07/10/2018, 2:52 AM

## 2018-07-14 NOTE — Discharge Summary (Deleted)
  The note originally documented on this encounter has been moved the the encounter in which it belongs.  

## 2018-07-14 NOTE — Consult Note (Addendum)
Reason for Consult:sepsis Referring Physician: Dr. Lolita Rieger is an 78 y.o. female.  HPI: The patient is a 78 year old black female who presents with sepsis to ER. She was recently admitted for this and was treated for UTI. She returns with fever and low blood pressure. CT scan suggests cholecystitis. Wbc 26,000 on admission. She has underlying dementia and lives in a nursing home  Past Medical History:  Diagnosis Date  . Anxiety   . Dementia ([place])   . Depression   . Diabetes mellitus   . Dyspnea   . GERD (gastroesophageal reflux disease)   . Headache(784.0)   . HTN (hypertension)    dr Johnsie Cancel  . Hypercholesteremia    DENIES  . Osteoarthritis   . PONV (postoperative nausea and vomiting)   . RLS (restless legs syndrome)   . Stress   . UTI (urinary tract infection) 05/27/2014    Past Surgical History:  Procedure Laterality Date  . ABDOMINAL HYSTERECTOMY    . COLONOSCOPY    . JOINT REPLACEMENT    . left forearm fracture with ORIF Left   . LUMBAR LAMINECTOMY/DECOMPRESSION MICRODISCECTOMY N/A 04/18/2012   Procedure: LUMBAR LAMINECTOMY/DECOMPRESSION MICRODISCECTOMY 3 LEVELS;  Surgeon: Winfield Cunas, MD;  Location: [facility];  Service: Neurosurgery;  Laterality: N/A;  Lumbar three-four, lumbar four-five, lumbar five-sacral one decompression. Synovial cyst resection lumbar four-five  . ORIF PERIPROSTHETIC FRACTURE Right 07/06/2016   Procedure: OPEN REDUCTION INTERNAL FIXATION (ORIF) PERIPROSTHETIC FRACTURE;  Surgeon: Gaynelle Arabian, MD;  Location: [facility];  Service: Orthopedics;  Laterality: Right;  . REPLACEMENT TOTAL KNEE Right   . WISDOM TOOTH EXTRACTION      Family History  Problem Relation Age of Onset  . Diabetes Father   . High blood pressure Father   . High blood pressure Mother   . Colon cancer Neg Hx   . Esophageal cancer Neg Hx   . Liver disease Neg Hx   . Stomach cancer Neg Hx   . Pancreatic cancer Neg Hx     Social History:  reports that she has  never smoked. She has never used smokeless tobacco. She reports that she does not drink alcohol or use drugs.  Allergies:  Allergies  Allergen Reactions  . Flounder [Fish Allergy] Other (See Comments)    Pt breaks out in bumps  . Morphine And Related Other (See Comments)    Reaction:  Hallucinations   . Oxycodone Other (See Comments)    Reaction:  Hallucinations  . Sulfonamide Derivatives Swelling and Other (See Comments)    Reaction:  Unspecified swelling reaction     Medications: I have reviewed the patient's current medications.  Results for orders placed or performed during the hospital encounter of 06/25/2018 (from the past 48 hour(s))  Lactic acid, plasma     Status: Abnormal   Collection Time: 07/12/2018  8:12 PM  Result Value Ref Range   Lactic Acid, Venous 4.0 (HH) 0.5 - 1.9 mmol/L    Comment: CRITICAL RESULT CALLED TO, READ BACK BY AND VERIFIED WITH: K.WICKER AT 2110 ON 06/30/2018 BY N.THOMPSON Performed at [facility], [place] [address], [place], [place] [address]   Comprehensive metabolic panel     Status: Abnormal   Collection Time: 07/19/2018  8:12 PM  Result Value Ref Range   Sodium 143 135 - 145 mmol/L   Potassium 3.9 3.5 - 5.1 mmol/L   Chloride 105 98 - 111 mmol/L   CO2 18 (L) 22 - 32  mmol/L   Glucose, Bld 542 (HH) 70 - 99 mg/dL    Comment: CRITICAL RESULT CALLED TO, READ BACK BY AND VERIFIED WITH: K.WICKER AT 2110 ON 06/28/2018 BY N.THOMPSON    BUN 36 (H) 8 - 23 mg/dL   Creatinine, Ser 1.75 (H) 0.44 - 1.00 mg/dL   Calcium 9.3 8.9 - 10.3 mg/dL   Total Protein 7.3 6.5 - 8.1 g/dL   Albumin 3.3 (L) 3.5 - 5.0 g/dL   AST 17 15 - 41 U/L   ALT 18 0 - 44 U/L   Alkaline Phosphatase 86 38 - 126 U/L   Total Bilirubin 2.0 (H) 0.3 - 1.2 mg/dL   GFR calc non Af Amer 27 (L) >60 mL/min   GFR calc Af Amer 32 (L) >60 mL/min   Anion gap 20 (H) 5 - 15    Comment: Performed at [facility], [place] [address] [place], [place] [address]  CBC WITH  DIFFERENTIAL     Status: Abnormal   Collection Time: 07/21/2018  8:12 PM  Result Value Ref Range   WBC 26.9 (H) 4.0 - 10.5 K/uL   RBC 4.77 3.87 - 5.11 MIL/uL   Hemoglobin 12.5 12.0 - 15.0 g/dL   HCT 39.8 36.0 - 46.0 %   MCV 83.4 80.0 - 100.0 fL   MCH 26.2 26.0 - 34.0 pg   MCHC 31.4 30.0 - 36.0 g/dL   RDW 17.8 (H) 11.5 - 15.5 %   Platelets 405 (H) 150 - 400 K/uL   nRBC 0.1 0.0 - 0.2 %   Neutrophils Relative % 92 %   Neutro Abs 24.7 (H) 1.7 - 7.7 K/uL   Lymphocytes Relative 3 %   Lymphs Abs 0.8 0.7 - 4.0 K/uL   Monocytes Relative 4 %   Monocytes Absolute 1.1 (H) 0.1 - 1.0 K/uL   Eosinophils Relative 0 %   Eosinophils Absolute 0.1 0.0 - 0.5 K/uL   Basophils Relative 0 %   Basophils Absolute 0.1 0.0 - 0.1 K/uL   WBC Morphology VACUOLATED NEUTROPHILS    Immature Granulocytes 1 %   Abs Immature Granulocytes 0.21 (H) 0.00 - 0.07 K/uL   Target Cells PRESENT     Comment: Performed at [facility], [place] [address], [place], [place] [address]  Urinalysis, Routine w reflex microscopic     Status: Abnormal   Collection Time: 06/28/2018  8:12 PM  Result Value Ref Range   Color, Urine YELLOW YELLOW   APPearance CLOUDY (A) CLEAR   Specific Gravity, Urine 1.015 1.005 - 1.030   pH 6.0 5.0 - 8.0   Glucose, UA >=500 (A) NEGATIVE mg/dL   Hgb urine dipstick MODERATE (A) NEGATIVE   Bilirubin Urine NEGATIVE NEGATIVE   Ketones, ur 20 (A) NEGATIVE mg/dL   Protein, ur 100 (A) NEGATIVE mg/dL   Nitrite NEGATIVE NEGATIVE   Leukocytes,Ua LARGE (A) NEGATIVE   RBC / HPF 6-10 0 - 5 RBC/hpf   WBC, UA >50 (H) 0 - 5 WBC/hpf   Bacteria, UA FEW (A) NONE SEEN   Squamous Epithelial / LPF 0-5 0 - 5   WBC Clumps PRESENT    Budding Yeast PRESENT     Comment: Performed at [facility], [place] [address], [place], [place] [address]  CBG monitoring, ED     Status: Abnormal   Collection Time: 07/02/2018 10:54 PM  Result Value Ref Range   Glucose-Capillary 445 (H) 70 - 99 mg/dL   Lactic acid, plasma  Status: None   Collection Time: 07/18/2018 11:33 PM  Result Value Ref Range   Lactic Acid, Venous 1.9 0.5 - 1.9 mmol/L    Comment: Performed at [facility], [place] [address] [place], [place] [address]  SARS Coronavirus 2 (CEPHEID- Performed in [facility] hospital lab), Hosp Order     Status: None   Collection Time: 07/01/2018 11:33 PM  Result Value Ref Range   SARS Coronavirus 2 NEGATIVE NEGATIVE    Comment: (NOTE) If result is NEGATIVE SARS-CoV-2 target nucleic acids are NOT DETECTED. The SARS-CoV-2 RNA is generally detectable in upper and lower  respiratory specimens during the acute phase of infection. The lowest  concentration of SARS-CoV-2 viral copies this assay can detect is 250  copies / mL. A negative result does not preclude SARS-CoV-2 infection  and should not be used as the sole basis for treatment or other  patient management decisions.  A negative result may occur with  improper specimen collection / handling, submission of specimen other  than nasopharyngeal swab, presence of viral mutation(s) within the  areas targeted by this assay, and inadequate number of viral copies  (<250 copies / mL). A negative result must be combined with clinical  observations, patient history, and epidemiological information. If result is POSITIVE SARS-CoV-2 target nucleic acids are DETECTED. The SARS-CoV-2 RNA is generally detectable in upper and lower  respiratory specimens dur ing the acute phase of infection.  Positive  results are indicative of active infection with SARS-CoV-2.  Clinical  correlation with patient history and other diagnostic information is  necessary to determine patient infection status.  Positive results do  not rule out bacterial infection or co-infection with other viruses. If result is PRESUMPTIVE POSTIVE SARS-CoV-2 nucleic acids MAY BE PRESENT.   A presumptive positive result was obtained on the submitted specimen  and  confirmed on repeat testing.  While 2019 novel coronavirus  (SARS-CoV-2) nucleic acids may be present in the submitted sample  additional confirmatory testing may be necessary for epidemiological  and / or clinical management purposes  to differentiate between  SARS-CoV-2 and other Sarbecovirus currently known to infect humans.  If clinically indicated additional testing with an alternate test  methodology [phone]) is advised. The SARS-CoV-2 RNA is generally  detectable in upper and lower respiratory sp ecimens during the acute  phase of infection. The expected result is Negative. Fact Sheet for Patients:  StrictlyIdeas.no Fact Sheet for Healthcare Providers: BankingDealers.co.za This test is not yet approved or cleared by the [place] FDA and has been authorized for detection and/or diagnosis of SARS-CoV-2 by FDA under an Emergency Use Authorization (EUA).  This EUA will remain in effect (meaning this test can be used) for the duration of the COVID-19 declaration under Section 564(b)(1) of the Act, 21 U.S.C. section 360bbb-3(b)(1), unless the authorization is terminated or revoked sooner. Performed at [facility], [place] [address] [place], [place] [address]   Glucose, capillary     Status: Abnormal   Collection Time: 07/14/18  1:53 AM  Result Value Ref Range   Glucose-Capillary 529 (HH) 70 - 99 mg/dL   Comment 1 Notify RN   Glucose, capillary     Status: Abnormal   Collection Time: 07/14/18  3:17 AM  Result Value Ref Range   Glucose-Capillary 508 (HH) 70 - 99 mg/dL   Comment 1 Notify RN    Comment 2 Document in Chart   MRSA PCR Screening     Status: None  Collection Time: 07/14/18  3:25 AM  Result Value Ref Range   MRSA by PCR NEGATIVE NEGATIVE    Comment:        The GeneXpert MRSA Assay (FDA approved for NASAL specimens only), is one component of a comprehensive MRSA colonization surveillance  program. It is not intended to diagnose MRSA infection nor to guide or monitor treatment for MRSA infections. Performed at [facility], [place] [address], [place], [place] [address]   Glucose, capillary     Status: Abnormal   Collection Time: 07/14/18  4:17 AM  Result Value Ref Range   Glucose-Capillary 480 (H) 70 - 99 mg/dL  Glucose, capillary     Status: Abnormal   Collection Time: 07/14/18  5:22 AM  Result Value Ref Range   Glucose-Capillary 379 (H) 70 - 99 mg/dL  CBC     Status: Abnormal   Collection Time: 07/14/18  5:38 AM  Result Value Ref Range   WBC 21.6 (H) 4.0 - 10.5 K/uL   RBC 4.14 3.87 - 5.11 MIL/uL   Hemoglobin 10.6 (L) 12.0 - 15.0 g/dL   HCT 35.6 (L) 36.0 - 46.0 %   MCV 86.0 80.0 - 100.0 fL   MCH 25.6 (L) 26.0 - 34.0 pg   MCHC 29.8 (L) 30.0 - 36.0 g/dL   RDW 17.6 (H) 11.5 - 15.5 %   Platelets 307 150 - 400 K/uL   nRBC 0.0 0.0 - 0.2 %    Comment: Performed at [facility], [place] [address], [place], [place] [address]    Ct Abdomen Pelvis Wo Contrast  Result Date: 07/20/2018 CLINICAL DATA:  History of recent UTI with generalized abdominal pain EXAM: CT ABDOMEN AND PELVIS WITHOUT CONTRAST TECHNIQUE: Multidetector CT imaging of the abdomen and pelvis was performed following the standard protocol without IV contrast. COMPARISON:  08/10/2016 FINDINGS: Lower chest: Mild scarring is noted in the bases bilaterally. No focal infiltrate or effusion is seen. Hepatobiliary: Gallbladder is significantly distended with pericholecystic inflammatory change and dependent cholelithiasis. These changes are consistent with acute cholecystitis till proven otherwise. Ultrasound may be helpful for further evaluation. Pancreas: Unremarkable. No pancreatic ductal dilatation or surrounding inflammatory changes. Spleen: Normal in size without focal abnormality. Adrenals/Urinary Tract: Adrenal glands are within normal limits bilaterally. Kidneys are unremarkable.  No renal calculi or obstructive changes are noted. The bladder is decompressed by Foley catheter. Stomach/Bowel: Contrast is noted throughout the large bowel. No obstructive or inflammatory changes are seen. The appendix is not well visualized although no inflammatory changes are seen. Stomach is distended with fluid with evidence of reflux into the distal esophagus. No small bowel abnormality is noted. Vascular/Lymphatic: Aortic atherosclerosis. No enlarged abdominal or pelvic lymph nodes. Reproductive: Status post hysterectomy. No adnexal masses. Other: No abdominal wall hernia or abnormality. No abdominopelvic ascites. Musculoskeletal: Right hip prosthesis is noted. Degenerative changes of lumbar spine are noted. Prior vertebral augmentation at L3 is noted. No acute abnormality is seen. IMPRESSION: Significantly dilated gallbladder with gallstones within. Considerable peri cholecystic inflammatory changes are noted consistent with acute cholecystitis. Right upper quadrant ultrasound may be helpful for further evaluation. Chronic changes as described above. Electronically Signed   By: Inez Catalina M.D.   On: 06/24/2018 20:47   Ct Head Wo Contrast  Result Date: 06/26/2018 CLINICAL DATA:  Altered mental status, history of UTIs EXAM: CT HEAD WITHOUT CONTRAST TECHNIQUE: Contiguous axial images were obtained from the base of the skull through the vertex without intravenous contrast. COMPARISON:  04/19/2018  FINDINGS: Brain: Chronic atrophic changes are noted with ventriculomegaly. Changes of chronic white matter ischemic change are again noted. No findings to suggest acute hemorrhage, acute infarction or space-occupying mass lesion are noted. Vascular: No hyperdense vessel or unexpected calcification. Skull: Normal. Negative for fracture or focal lesion. Sinuses/Orbits: No acute finding. Other: None. IMPRESSION: Atrophic and ischemic changes without acute abnormality. Electronically Signed   By: Inez Catalina M.D.    On: 07/09/2018 20:41   Dg Chest Port 1 View  Result Date: 07/19/2018 CLINICAL DATA:  Altered mental status, history of recent UTI, possible sepsis EXAM: PORTABLE CHEST 1 VIEW COMPARISON:  07/06/2018 FINDINGS: The heart size and mediastinal contours are within normal limits. Both lungs are clear. The visualized skeletal structures are unremarkable. IMPRESSION: No active disease. Electronically Signed   By: Inez Catalina M.D.   On: 07/22/2018 20:48    Review of Systems  Constitutional: Positive for fever.  HENT: Negative.   Eyes: Negative.   Respiratory: Negative.   Cardiovascular: Negative.   Gastrointestinal: Positive for abdominal pain.  Genitourinary: Negative.   Musculoskeletal: Negative.   Skin: Negative.   Neurological: Negative.   Endo/Heme/Allergies: Negative.   Psychiatric/Behavioral: Positive for memory loss.   Blood pressure (!) 76/39, pulse (!) 118, temperature 99.6 F (37.6 C), temperature source Axillary, resp. rate (!) 27, height 5\' 7"  (1.702 m), weight 81.9 kg, SpO2 97 %. Physical Exam  Constitutional:  Obtunded elderly female. No apparent distress  HENT:  Head: Normocephalic and atraumatic.  Mouth/Throat: No oropharyngeal exudate.  Eyes: Pupils are equal, round, and reactive to light. Conjunctivae and EOM are normal.  Neck: Normal range of motion. Neck supple.  Cardiovascular: Normal rate, regular rhythm and normal heart sounds.  Respiratory: Effort normal and breath sounds normal. No stridor. No respiratory distress.  GI: Soft.  She moves when I palpate her abdomen but it is soft  Musculoskeletal:     Comments: Lying in bed and moves very little  Neurological:  Obtunded. Responds minimally with stimulatiion  Skin: Skin is warm and dry.  Psychiatric:  Obtunded. Underlying dementia    Assessment/Plan: The patient appears to have sepsis which could be coming from multiple sources. I would agree with admission to ICU for IV abx and aggressive resuscitation.  Foley will decompress bladder if she has UTI. I would recommend perc drain of gallbladder to treat possible cholecystitis. She is a poor surgical candidate but if she improves after perc drain then we can consider surgery to remove gallbladder at a later date. Will follow  Autumn Messing III 07/14/2018, 6:09 AM

## 2018-07-14 NOTE — Progress Notes (Signed)
CRITICAL VALUE ALERT  Critical Value:  K+ 2.7   Date & Time Notied:  07/14/18  0743  Provider Notified: Dr Tawanna Solo  Orders Received/Actions taken:orders for IV K+ received

## 2018-07-14 NOTE — Progress Notes (Signed)
Spoke with pt's daughter after drain placement to give her updates on her mom

## 2018-07-14 NOTE — Progress Notes (Signed)
Pharmacy Antibiotic Note  [name] is a 78 y.o. female admitted on 07/07/2018 with sepsis.  Pharmacy has been consulted for Vancomycin, cefepime dosing.  Plan: Vancomycin 1gm iv x1, then 750mg  iv x1, then Vancomycin 1500 mg IV Q 48 hrs. Goal AUC 400-550. Expected AUC: 480 SCr used: 1.75  Cefepime 2gm iv x1, then 2gm iv q24hr   Height: 5\' 7"  (170.2 cm) Weight: 180 lb 8.9 oz (81.9 kg) IBW/kg (Calculated) : 61.6  Temp (24hrs), Avg:102.1 F (38.9 C), Min:99.6 F (37.6 C), Max:106.1 F (41.2 C)  Recent Labs  Lab 07/07/18 0706  07/08/18 0556 07/09/18 1814 07/10/18 0859 06/22/2018 0752 07/22/2018 2012 06/23/2018 2333  WBC 11.9*  --  9.7  --  8.3  --  26.9*  --   CREATININE 1.21*   < > 1.09* 0.98 0.80 0.73 1.75*  --   LATICACIDVEN  --   --   --   --   --   --  4.0* 1.9   < > = values in this interval not displayed.    Estimated Creatinine Clearance: 28.7 mL/min (A) (by C-G formula based on SCr of 1.75 mg/dL (H)).    Allergies  Allergen Reactions  . Flounder [Fish Allergy] Other (See Comments)    Pt breaks out in bumps  . Morphine And Related Other (See Comments)    Reaction:  Hallucinations   . Oxycodone Other (See Comments)    Reaction:  Hallucinations  . Sulfonamide Derivatives Swelling and Other (See Comments)    Reaction:  Unspecified swelling reaction     Antimicrobials this admission: Vancomycin 07/02/2018 >> Cefepime 07/08/2018 >>   Dose adjustments this admission: -  Microbiology results: -  Thank you for allowing pharmacy to be a part of this patient's care.  [name] 07/14/2018 5:06 AM

## 2018-07-14 NOTE — Discharge Summary (Signed)
Physician Discharge Summary  Keaghan Bowens Sada OMV:[id number] DOB: [birth year] DOA: 07/06/2018  PCP: [name], MD  Admit date: 07/06/2018 Discharge date: 06/23/2018  Admitted From: Home Disposition:  Home  Discharge Condition:Stable CODE STATUS:DNR Diet recommendation: Dysphagia 1,thin liquid  Brief/Interim Summary: Patient is a 78 year old female with history of advanced dementia, diabetes mellitus type 2, hypertension, recurrent UTIs, dysphagia, indwelling Foley catheter at baseline, who presented with worsening confusion and foul-smelling urine. Patient was admitted for the management of urinary tract infection.  Her urine culture grew multiple organisms.  She was treated with 5 days of IV ceftriaxone.  Currently she is hemodynamically stable, afebrile.  Mental status is back to baseline.  She is hemodynamically stable for discharge to home today.  Following problems were addressed during her hospitalization:   1. Severe sepsis-sepsis physiology has resolved. Secondary to CAUTI, Treated with antibiotics.  2. CAUTI-patient has chronic Foley catheter, urine culture grew multiple species. Treated with 5 days course of  Ceftriaxone.  3. Coagulase-negative staph bacteremia-blood cultures x1 bottle, anaerobic growing coagulase-negative staph, methicillin-resistant. Likely contaminant. Would not treat at this time.   4. Metabolic encephalopathy-patient has underlying dementia, likely could have worsened from above. Appears to be back to baseline.  5. Acute kidney injury-patient baseline creatinine 0.7, she presented with creatinine of 1.34 on admission.   Kidney function on baseline now.  6. Hypernatremia-Resolved .  7. Dysphagia-speech therapy was consulted for dysphagia, patient started on dysphagia 1 diet.  8. Hypertension-blood pressures controlled, amlodipine and losartan are on hold.   Continue to monitor blood pressure at home.  Restart these  medications if needed.  9. Diabetes mellitus type 2-resume home meds.   10. Hypokalemia-Supplemented and corrected.    Discharge Diagnoses:  Principal Problem:   Urinary tract infection associated with catheterization of urinary tract, initial encounter [facility]) Active Problems:   Essential hypertension   Sepsis ([place])   Acute kidney injury ([place])   Dementia ([place])   Dysphagia   DM type 2 (diabetes mellitus, type 2) (HCC)   Pressure injury of skin   Altered mental status    Discharge Instructions      Discharge Instructions    Diet general   Complete by:  As directed    Dysphagia 1,thin liquid   Discharge instructions   Complete by:  As directed    1)Please follow up with your PCP in a week.  Do a CBC, BMP test during the follow-up 2)Your blood pressure is currently stable without any medications.  We have held amlodipine and losartan.  Follow-up with your PCP.  Monitor your blood pressure at home.  If needed you can restart this medications.   Increase activity slowly   Complete by:  As directed           Allergies as of 07/18/2018      Reactions   Flounder [fish Allergy] Other (See Comments)   Pt breaks out in bumps   Morphine And Related Other (See Comments)   Reaction:  Hallucinations    Oxycodone Other (See Comments)   Reaction:  Hallucinations   Sulfonamide Derivatives Swelling, Other (See Comments)   Reaction:  Unspecified swelling reaction          Medication List    STOP taking these medications   amLODipine 10 MG tablet Commonly known as:  NORVASC   losartan 25 MG tablet Commonly known as:  COZAAR     TAKE these medications   acetaminophen 500 MG tablet Commonly known as:  TYLENOL  Take 500 mg by mouth every 8 (eight) hours as needed for mild pain.   ALPRAZolam 0.25 MG tablet Commonly known as:  XANAX Take 1 tablet (0.25 mg total) by mouth at bedtime as needed for anxiety.   aspirin 325 MG EC tablet Take  325 mg by mouth daily.   Dermacloud Crea Apply 1 application topically as needed (each change).   loratadine-pseudoephedrine 5-120 MG tablet Commonly known as:  CLARITIN-D 12-hour Take 1 tablet by mouth 2 (two) times daily as needed for allergies.   Magnesium Oxide 500 MG Tabs Take 500 mg by mouth daily.   metFORMIN 500 MG tablet Commonly known as:  GLUCOPHAGE Take 500-1,000 mg by mouth See admin instructions. Take 1,000mg  by mouth in morning, 500mg  by mouth at lunch, and 500mg  by mouth in evening   omeprazole 40 MG capsule Commonly known as:  PRILOSEC Take 40 mg by mouth daily.   ondansetron 4 MG tablet Commonly known as:  ZOFRAN Take 4 mg by mouth every 6 (six) hours as needed for nausea/vomiting.   pramipexole 1.5 MG tablet Commonly known as:  MIRAPEX Take 1.5 mg by mouth 3 (three) times daily.         Follow-up Information    [name], MD. Schedule an appointment as soon as possible for a visit in 1 week(s).   Specialty:  Internal Medicine Contact information: [place] [address] [phone]               Allergies  Allergen Reactions   Flounder [Fish Allergy] Other (See Comments)    Pt breaks out in bumps   Morphine And Related Other (See Comments)    Reaction:  Hallucinations    Oxycodone Other (See Comments)    Reaction:  Hallucinations   Sulfonamide Derivatives Swelling and Other (See Comments)    Reaction:  Unspecified swelling reaction     Consultations:  None   Procedures/Studies: ImagingResults   Dg Chest Port 1 View  Result Date: 07/06/2018 CLINICAL DATA:  Fever. EXAM: PORTABLE CHEST 1 VIEW COMPARISON:  Radiograph 06/12/2018 FINDINGS: Low lung volumes.The cardiomediastinal contours are unchanged with mild aortic tortuosity. Pulmonary vasculature is normal. No consolidation, pleural effusion, or pneumothorax. No acute osseous abnormalities are seen. IMPRESSION: Low lung volumes  without acute abnormality. Electronically Signed   By: Keith Rake M.D.   On: 07/06/2018 19:51   Dg Chest Port 1 View  Result Date: 06/12/2018 CLINICAL DATA:  Cardiac arrhythmia EXAM: PORTABLE CHEST 1 VIEW COMPARISON:  May 18, 2018 FINDINGS: There is no edema or consolidation. Heart is upper normal in size with pulmonary vascularity normal. No adenopathy. No pneumothorax. No bone lesions. IMPRESSION: No edema or consolidation.  Stable cardiac silhouette. Electronically Signed   By: Lowella Grip III M.D.   On: 06/12/2018 11:01   Dg Swallowing Func-speech Pathology  Result Date: 07/09/2018 Objective Swallowing Evaluation: Type of Study: MBS-Modified Barium Swallow Study  Patient Details Name: MYRISSA CHIPLEY MRN: [id number] Date of Birth: [birth year] Today's Date: 07/09/2018 Time: SLP Start Time (ACUTE ONLY): 0845 -SLP Stop Time (ACUTE ONLY): 0905 SLP Time Calculation (min) (ACUTE ONLY): 20 min Past Medical History: Past Medical History: Diagnosis Date  Anxiety   Dementia ([place])   Depression   Diabetes mellitus   Dyspnea   GERD (gastroesophageal reflux disease)   Headache(784.0)   HTN (hypertension)   dr Johnsie Cancel  Hypercholesteremia   DENIES  Osteoarthritis   PONV (postoperative nausea and vomiting)   RLS (restless legs syndrome)  Stress   UTI (urinary tract infection) 05/27/2014 Past Surgical History: Past Surgical History: Procedure Laterality Date  ABDOMINAL HYSTERECTOMY    COLONOSCOPY    JOINT REPLACEMENT    left forearm fracture with ORIF Left   LUMBAR LAMINECTOMY/DECOMPRESSION MICRODISCECTOMY N/A 04/18/2012  Procedure: LUMBAR LAMINECTOMY/DECOMPRESSION MICRODISCECTOMY 3 LEVELS;  Surgeon: Winfield Cunas, MD;  Location: [facility];  Service: Neurosurgery;  Laterality: N/A;  Lumbar three-four, lumbar four-five, lumbar five-sacral one decompression. Synovial cyst resection lumbar four-five  ORIF PERIPROSTHETIC FRACTURE Right 07/06/2016  Procedure: OPEN REDUCTION INTERNAL FIXATION  (ORIF) PERIPROSTHETIC FRACTURE;  Surgeon: Gaynelle Arabian, MD;  Location: [facility];  Service: Orthopedics;  Laterality: Right;  REPLACEMENT TOTAL KNEE Right   WISDOM TOOTH EXTRACTION   HPI: Patient is a 78 y.o. female with PMH: advanced dementia, DM2, HTN, recurrent UTI's, dysphagia, indwelling foley catheter at baseline, who presented with worsening confusion and foul smelling urine. She is being treated for CAUTI and undergoing workup for sepsis. Patient with h/o dysphagia with most recent MBS in 2016, which revealed silent aspiration of honey thick liquids and recommended puree solids only but with significant aspiration risk.  Subjective: alert, verbalizing some Assessment / Plan / Recommendation CHL IP CLINICAL IMPRESSIONS 07/09/2018 Clinical Impression Patient presents with a mild-moderate oral and a moderate pharyngeal dysphagia. She exhibited delays in oral transit of pill in puree and delays in puree transit anterior to posterior in oral cavity. Pharyngeal phase consisted of delayed swallow initation to vallecular sinus with puree solids, and delays to pyriform sinus with tablet, thin and nectar thick liquid consistencies; residuals remaining in pyriform sinus, and lateral channel residuals with thin liquids and nectar thick liquids, and residuals in vallecular and pyriform sinus with puree solids. Patient had one instance of penetration with trace amount of thin liquids which occured during the swallow with large volume straw sip. When SLP presented a dry spoon and pressed down on tongue, patient initiated a swallow which did help in clearing majority of pharyngeal residuals. Patient did not sense penetrate, which remained above vocal cords. Cervical esophageal phase appeared [facility] with full transit of boluses.  SLP Visit Diagnosis Dysphagia, oropharyngeal phase (R13.12) Attention and concentration deficit following -- Frontal lobe and executive function deficit following -- Impact on safety and function  Moderate aspiration risk   CHL IP TREATMENT RECOMMENDATION 07/09/2018 Treatment Recommendations Therapy as outlined in treatment plan below   Prognosis 07/09/2018 Prognosis for Safe Diet Advancement Fair Barriers to Reach Goals Severity of deficits;Time post onset Barriers/Prognosis Comment -- CHL IP DIET RECOMMENDATION 07/09/2018 SLP Diet Recommendations Dysphagia 1 (Puree) solids;Thin liquid Liquid Administration via Cup;No straw Medication Administration Crushed with puree Compensations Minimize environmental distractions;Slow rate;Small sips/bites;Other (Comment) Postural Changes Seated upright at 90 degrees   CHL IP OTHER RECOMMENDATIONS 07/09/2018 Recommended Consults -- Oral Care Recommendations Oral care BID Other Recommendations --   CHL IP FOLLOW UP RECOMMENDATIONS 07/09/2018 Follow up Recommendations Home health SLP;24 hour supervision/assistance   CHL IP FREQUENCY AND DURATION 07/09/2018 Speech Therapy Frequency (ACUTE ONLY) min 2x/week Treatment Duration 1 week      CHL IP ORAL PHASE 07/09/2018 Oral Phase Impaired Oral - Pudding Teaspoon -- Oral - Pudding Cup -- Oral - Honey Teaspoon -- Oral - Honey Cup -- Oral - Nectar Teaspoon -- Oral - Nectar Cup -- Oral - Nectar Straw -- Oral - Thin Teaspoon -- Oral - Thin Cup -- Oral - Thin Straw -- Oral - Puree Weak lingual manipulation;Reduced posterior propulsion;Delayed oral transit Oral - Mech Soft --  Oral - Regular -- Oral - Multi-Consistency -- Oral - Pill Reduced posterior propulsion;Holding of bolus;Delayed oral transit Oral Phase - Comment --  CHL IP PHARYNGEAL PHASE 07/09/2018 Pharyngeal Phase Impaired Pharyngeal- Pudding Teaspoon -- Pharyngeal -- Pharyngeal- Pudding Cup -- Pharyngeal -- Pharyngeal- Honey Teaspoon -- Pharyngeal -- Pharyngeal- Honey Cup -- Pharyngeal -- Pharyngeal- Nectar Teaspoon -- Pharyngeal -- Pharyngeal- Nectar Cup Delayed swallow initiation-pyriform sinuses;Reduced pharyngeal peristalsis;Pharyngeal residue - valleculae;Pharyngeal residue -  pyriform;Lateral channel residue Pharyngeal -- Pharyngeal- Nectar Straw -- Pharyngeal -- Pharyngeal- Thin Teaspoon -- Pharyngeal -- Pharyngeal- Thin Cup Delayed swallow initiation-pyriform sinuses;Pharyngeal residue - pyriform;Lateral channel residue Pharyngeal -- Pharyngeal- Thin Straw Penetration/Aspiration during swallow;Delayed swallow initiation-pyriform sinuses;Pharyngeal residue - pyriform;Lateral channel residue Pharyngeal Material enters airway, remains ABOVE vocal cords and not ejected out Pharyngeal- Puree Delayed swallow initiation-vallecula;Pharyngeal residue - valleculae;Pharyngeal residue - pyriform;Other (Comment) Pharyngeal -- Pharyngeal- Mechanical Soft -- Pharyngeal -- Pharyngeal- Regular -- Pharyngeal -- Pharyngeal- Multi-consistency -- Pharyngeal -- Pharyngeal- Pill Delayed swallow initiation-pyriform sinuses;Pharyngeal residue - pyriform Pharyngeal -- Pharyngeal Comment --  CHL IP CERVICAL ESOPHAGEAL PHASE 07/09/2018 Cervical Esophageal Phase WFL Pudding Teaspoon -- Pudding Cup -- Honey Teaspoon -- Honey Cup -- Nectar Teaspoon -- Nectar Cup -- Nectar Straw -- Thin Teaspoon -- Thin Cup -- Thin Straw -- Puree -- Mechanical Soft -- Regular -- Multi-consistency -- Pill -- Cervical Esophageal Comment -- Dannial Monarch 07/09/2018, 10:13 AM    Sonia Baller, MA, CCC-SLP Speech Therapy [facility]                Subjective: Patient seen and examined the bedside this morning.  Hemodynamically stable.  Comfortable.  Denies any discomfort.  Mental status is at baseline.  Discharge Exam:     Vitals:   07/10/18 1951 06/27/2018 0443  BP: 90/62 133/77  Pulse: 93 74  Resp: 16 19  Temp: 98.5 F (36.9 C) 98.5 F (36.9 C)  SpO2: 100% 96%   Vitals:   07/10/18 0517 07/10/18 1420 07/10/18 1951 07/13/2018 0443  BP: (!) 144/98 122/90 90/62 133/77  Pulse: 93 91 93 74  Resp: (!) 24  16 19   Temp: 98.5 F (36.9 C) 98.6 F (37 C) 98.5 F (36.9 C) 98.5 F (36.9 C)  TempSrc:   Oral Oral Oral  SpO2: 99% 99% 100% 96%  Weight:      Height:        General: Pt is alert, awake, not in acute distress Cardiovascular: RRR, S1/S2 +, no rubs, no gallops Respiratory: CTA bilaterally, no wheezing, no rhonchi Abdominal: Soft, NT, ND, bowel sounds + Extremities: no edema, no cyanosis    The results of significant diagnostics from this hospitalization (including imaging, microbiology, ancillary and laboratory) are listed below for reference.     Microbiology:        Recent Results (from the past 240 hour(s))  Blood culture (routine x 2)     Status: None (Preliminary result)   Collection Time: 07/06/18  6:06 PM  Result Value Ref Range Status   Specimen Description BLOOD RIGHT HAND  Final   Special Requests   Final    BOTTLES DRAWN AEROBIC AND ANAEROBIC Blood Culture results may not be optimal due to an inadequate volume of blood received in culture bottles   Culture   Final    NO GROWTH 4 DAYS Performed at [facility], [place] [address], [place], [place] [address]    Report Status PENDING  Incomplete  Blood culture (routine x 2)  Status: Abnormal (Preliminary result)   Collection Time: 07/06/18  6:13 PM  Result Value Ref Range Status   Specimen Description BLOOD LEFT HAND  Final   Special Requests   Final    BOTTLES DRAWN AEROBIC AND ANAEROBIC Blood Culture adequate volume   Culture  Setup Time   Final    ANAEROBIC BOTTLE ONLY GRAM POSITIVE COCCI IN PAIRS RESULT CALLED TO, READ BACK BY AND VERIFIED WITH: BETH GREENE @ 1751 ON 07/08/18 BY ROBINSON Z.  AEROBIC BOTTLE ONLY GRAM POSITIVE RODS CRITICAL RESULT CALLED TO, READ BACK BY AND VERIFIED WITH: Lavell Luster [facility] 07/10/18 2340 JDW Performed at [facility], [place] [address] [place], [place] [address]    Culture (A)  Final    STAPHYLOCOCCUS SPECIES (COAGULASE NEGATIVE) THE SIGNIFICANCE OF ISOLATING THIS ORGANISM FROM A SINGLE SET OF BLOOD CULTURES  WHEN MULTIPLE SETS ARE DRAWN IS UNCERTAIN. PLEASE NOTIFY THE MICROBIOLOGY DEPARTMENT WITHIN ONE WEEK IF SPECIATION AND SENSITIVITIES ARE REQUIRED. GRAM POSITIVE RODS    Report Status PENDING  Incomplete  Blood Culture ID Panel (Reflexed)     Status: Abnormal   Collection Time: 07/06/18  6:13 PM  Result Value Ref Range Status   Enterococcus species NOT DETECTED NOT DETECTED Final   Listeria monocytogenes NOT DETECTED NOT DETECTED Final   Staphylococcus species DETECTED (A) NOT DETECTED Final    Comment: Methicillin (oxacillin) resistant coagulase negative staphylococcus. Possible blood culture contaminant (unless isolated from more than one blood culture draw or clinical case suggests pathogenicity). No antibiotic treatment is indicated for blood  culture contaminants. CRITICAL RESULT CALLED TO, READ BACK BY AND VERIFIED WITH: BETH GREENE @ 0056 ON 07/08/18 BY ROBINSON Z.     Staphylococcus aureus (BCID) NOT DETECTED NOT DETECTED Final   Methicillin resistance DETECTED (A) NOT DETECTED Final    Comment: CRITICAL RESULT CALLED TO, READ BACK BY AND VERIFIED WITH: BETH GREENE @ 0056 ON 07/08/18 BY ROBINSON Z.     Streptococcus species NOT DETECTED NOT DETECTED Final   Streptococcus agalactiae NOT DETECTED NOT DETECTED Final   Streptococcus pneumoniae NOT DETECTED NOT DETECTED Final   Streptococcus pyogenes NOT DETECTED NOT DETECTED Final   Acinetobacter baumannii NOT DETECTED NOT DETECTED Final   Enterobacteriaceae species NOT DETECTED NOT DETECTED Final   Enterobacter cloacae complex NOT DETECTED NOT DETECTED Final   Escherichia coli NOT DETECTED NOT DETECTED Final   Klebsiella oxytoca NOT DETECTED NOT DETECTED Final   Klebsiella pneumoniae NOT DETECTED NOT DETECTED Final   Proteus species NOT DETECTED NOT DETECTED Final   Serratia marcescens NOT DETECTED NOT DETECTED Final   Haemophilus influenzae NOT DETECTED NOT DETECTED Final   Neisseria meningitidis NOT  DETECTED NOT DETECTED Final   Pseudomonas aeruginosa NOT DETECTED NOT DETECTED Final   Candida albicans NOT DETECTED NOT DETECTED Final   Candida glabrata NOT DETECTED NOT DETECTED Final   Candida krusei NOT DETECTED NOT DETECTED Final   Candida parapsilosis NOT DETECTED NOT DETECTED Final   Candida tropicalis NOT DETECTED NOT DETECTED Final    Comment: Performed at [facility], [place] [address], [place], [place] [address]  Urine culture     Status: Abnormal   Collection Time: 07/06/18  7:43 PM  Result Value Ref Range Status   Specimen Description   Final    URINE, CLEAN CATCH Performed at [facility], [place] [address], [place], [place] [address]    Special Requests   Final    NONE Performed at [facility], [place] [address] [place], [place] [address]    Culture (A)  Final    >=100,000 COLONIES/mL MULTIPLE SPECIES PRESENT, SUGGEST RECOLLECTION   Report Status 07/08/2018 FINAL  Final  SARS Coronavirus 2 (CEPHEID - Performed in [place] hospital lab), Hosp Order     Status: None   Collection Time: 07/06/18  8:11 PM  Result Value Ref Range Status   SARS Coronavirus 2 NEGATIVE NEGATIVE Final    Comment: (NOTE) If result is NEGATIVE SARS-CoV-2 target nucleic acids are NOT DETECTED. The SARS-CoV-2 RNA is generally detectable in upper and lower  respiratory specimens during the acute phase of infection. The lowest  concentration of SARS-CoV-2 viral copies this assay can detect is 250  copies / mL. A negative result does not preclude SARS-CoV-2 infection  and should not be used as the sole basis for treatment or other  patient management decisions.  A negative result may occur with  improper specimen collection / handling, submission of specimen other  than nasopharyngeal swab, presence of viral mutation(s) within the  areas targeted by this assay, and inadequate number of viral copies  (<250 copies / mL). A  negative result must be combined with clinical  observations, patient history, and epidemiological information. If result is POSITIVE SARS-CoV-2 target nucleic acids are DETECTED. The SARS-CoV-2 RNA is generally detectable in upper and lower  respiratory specimens dur ing the acute phase of infection.  Positive  results are indicative of active infection with SARS-CoV-2.  Clinical  correlation with patient history and other diagnostic information is  necessary to determine patient infection status.  Positive results do  not rule out bacterial infection or co-infection with other viruses. If result is PRESUMPTIVE POSTIVE SARS-CoV-2 nucleic acids MAY BE PRESENT.   A presumptive positive result was obtained on the submitted specimen  and confirmed on repeat testing.  While 2019 novel coronavirus  (SARS-CoV-2) nucleic acids may be present in the submitted sample  additional confirmatory testing may be necessary for epidemiological  and / or clinical management purposes  to differentiate between  SARS-CoV-2 and other Sarbecovirus currently known to infect humans.  If clinically indicated additional testing with an alternate test  methodology [phone]) is advised. The SARS-CoV-2 RNA is generally  detectable in upper and lower respiratory sp ecimens during the acute  phase of infection. The expected result is Negative. Fact Sheet for Patients:  StrictlyIdeas.no Fact Sheet for Healthcare Providers: BankingDealers.co.za This test is not yet approved or cleared by the [place] FDA and has been authorized for detection and/or diagnosis of SARS-CoV-2 by FDA under an Emergency Use Authorization (EUA).  This EUA will remain in effect (meaning this test can be used) for the duration of the COVID-19 declaration under Section 564(b)(1) of the Act, 21 U.S.C. section 360bbb-3(b)(1), unless the authorization is terminated or revoked sooner. Performed  at [facility], [place] [address], [place], [place] [address]      Labs: BNP (last 3 results) RecentLabs(withinlast365days)  No results for input(s): BNP in the last 8760 hours.   Basic Metabolic Panel: LastLabs         Recent Labs  Lab 07/07/18 1640 07/08/18 0556 07/09/18 1814 07/10/18 0859 07/12/2018 0752  NA 153* 151* 148* 144 144  K 4.0 3.7 3.2* 3.2* 4.1  CL 124* 123* 120* 117* 119*  CO2 20* 19* 19* 19* 16*  GLUCOSE 244* 228* 174* 213* 116*  BUN 61* 56* 42* 33* 30*  CREATININE 1.23* 1.09* 0.98 0.80 0.73  CALCIUM 9.4 9.3 9.2 9.0 8.9     Liver Function Tests: LastLabs      Recent Labs  Lab 07/06/18 1811 07/10/18 0859  AST 15 16  ALT 18 18  ALKPHOS 72 60  BILITOT 0.8 0.7  PROT 7.6 6.3*  ALBUMIN 3.4* 3.0*     LastLabs  No results for input(s): LIPASE, AMYLASE in the last 168 hours.   LastLabs  No results for input(s): AMMONIA in the last 168 hours.   CBC: LastLabs        Recent Labs  Lab 07/06/18 1811 07/07/18 0706 07/08/18 0556 07/10/18 0859  WBC 13.7* 11.9* 9.7 8.3  NEUTROABS 11.3*  --   --   --   HGB 11.4* 10.5* 9.7* 10.2*  HCT 37.8 36.2 32.7* 35.2*  MCV 85.1 88.3 84.1 88.4  PLT 348 288 212 283     Cardiac Enzymes: LastLabs     Recent Labs  Lab 07/06/18 1811  TROPONINI <0.03     BNP: LastLabs  Invalid input(s): POCBNP   CBG: LastLabs  Recent Labs  Lab 07/10/18 0729 07/10/18 1239 07/10/18 1758 07/10/18 2110 07/19/2018 0749  GLUCAP 199* 190* 136* 113* 113*     D-Dimer RecentLabs(last2labs)  No results for input(s): DDIMER in the last 72 hours.   Hgb A1c RecentLabs(last2labs)  No results for input(s): HGBA1C in the last 72 hours.   Lipid Profile RecentLabs(last2labs)  No results for input(s): CHOL, HDL, LDLCALC, TRIG, CHOLHDL, LDLDIRECT in the last 72 hours.   Thyroid function studies  RecentLabs(last2labs)  No results for input(s): TSH, T4TOTAL,  T3FREE, THYROIDAB in the last 72 hours.  Invalid input(s): FREET3   Anemia work up RecentLabs(last2labs)  No results for input(s): VITAMINB12, FOLATE, FERRITIN, TIBC, IRON, RETICCTPCT in the last 72 hours.   Urinalysis Labs(Brief)          Component Value Date/Time   COLORURINE YELLOW 07/06/2018 1943   APPEARANCEUR TURBID (A) 07/06/2018 1943   LABSPEC 1.018 07/06/2018 1943   PHURINE 5.0 07/06/2018 1943   GLUCOSEU >=500 (A) 07/06/2018 1943   HGBUR MODERATE (A) 07/06/2018 [place] 07/06/2018 [place] 07/06/2018 1943   PROTEINUR 100 (A) 07/06/2018 1943   UROBILINOGEN 0.2 07/20/2014 2228   NITRITE NEGATIVE 07/06/2018 1943   LEUKOCYTESUR MODERATE (A) 07/06/2018 1943     Sepsis Labs LastLabs  Invalid input(s): PROCALCITONIN,  WBC,  LACTICIDVEN   Microbiology        Recent Results (from the past 240 hour(s))  Blood culture (routine x 2)     Status: None (Preliminary result)   Collection Time: 07/06/18  6:06 PM  Result Value Ref Range Status   Specimen Description BLOOD RIGHT HAND  Final   Special Requests   Final    BOTTLES DRAWN AEROBIC AND ANAEROBIC Blood Culture results may not be optimal due to an inadequate volume of blood received in culture bottles   Culture   Final    NO GROWTH 4 DAYS Performed at [facility], [place] [address], [place], [place] [address]    Report Status PENDING  Incomplete  Blood culture (routine x 2)     Status: Abnormal (Preliminary result)   Collection Time: 07/06/18  6:13 PM  Result Value Ref Range Status   Specimen Description BLOOD LEFT HAND  Final   Special Requests   Final    BOTTLES DRAWN AEROBIC AND ANAEROBIC Blood Culture adequate volume   Culture  Setup Time   Final  ANAEROBIC BOTTLE ONLY GRAM POSITIVE COCCI IN PAIRS RESULT CALLED TO, READ BACK BY AND VERIFIED WITH: BETH GREENE @ 0056 ON 07/08/18 BY ROBINSON Z.  AEROBIC BOTTLE ONLY GRAM  POSITIVE RODS CRITICAL RESULT CALLED TO, READ BACK BY AND VERIFIED WITH: Lavell Luster [facility] 07/10/18 2340 JDW Performed at [facility], [place] [address] [place], [place] [address]    Culture (A)  Final    STAPHYLOCOCCUS SPECIES (COAGULASE NEGATIVE) THE SIGNIFICANCE OF ISOLATING THIS ORGANISM FROM A SINGLE SET OF BLOOD CULTURES WHEN MULTIPLE SETS ARE DRAWN IS UNCERTAIN. PLEASE NOTIFY THE MICROBIOLOGY DEPARTMENT WITHIN ONE WEEK IF SPECIATION AND SENSITIVITIES ARE REQUIRED. GRAM POSITIVE RODS    Report Status PENDING  Incomplete  Blood Culture ID Panel (Reflexed)     Status: Abnormal   Collection Time: 07/06/18  6:13 PM  Result Value Ref Range Status   Enterococcus species NOT DETECTED NOT DETECTED Final   Listeria monocytogenes NOT DETECTED NOT DETECTED Final   Staphylococcus species DETECTED (A) NOT DETECTED Final    Comment: Methicillin (oxacillin) resistant coagulase negative staphylococcus. Possible blood culture contaminant (unless isolated from more than one blood culture draw or clinical case suggests pathogenicity). No antibiotic treatment is indicated for blood  culture contaminants. CRITICAL RESULT CALLED TO, READ BACK BY AND VERIFIED WITH: BETH GREENE @ 0056 ON 07/08/18 BY ROBINSON Z.     Staphylococcus aureus (BCID) NOT DETECTED NOT DETECTED Final   Methicillin resistance DETECTED (A) NOT DETECTED Final    Comment: CRITICAL RESULT CALLED TO, READ BACK BY AND VERIFIED WITH: BETH GREENE @ 0056 ON 07/08/18 BY ROBINSON Z.     Streptococcus species NOT DETECTED NOT DETECTED Final   Streptococcus agalactiae NOT DETECTED NOT DETECTED Final   Streptococcus pneumoniae NOT DETECTED NOT DETECTED Final   Streptococcus pyogenes NOT DETECTED NOT DETECTED Final   Acinetobacter baumannii NOT DETECTED NOT DETECTED Final   Enterobacteriaceae species NOT DETECTED NOT DETECTED Final   Enterobacter cloacae complex NOT DETECTED NOT DETECTED Final   Escherichia coli  NOT DETECTED NOT DETECTED Final   Klebsiella oxytoca NOT DETECTED NOT DETECTED Final   Klebsiella pneumoniae NOT DETECTED NOT DETECTED Final   Proteus species NOT DETECTED NOT DETECTED Final   Serratia marcescens NOT DETECTED NOT DETECTED Final   Haemophilus influenzae NOT DETECTED NOT DETECTED Final   Neisseria meningitidis NOT DETECTED NOT DETECTED Final   Pseudomonas aeruginosa NOT DETECTED NOT DETECTED Final   Candida albicans NOT DETECTED NOT DETECTED Final   Candida glabrata NOT DETECTED NOT DETECTED Final   Candida krusei NOT DETECTED NOT DETECTED Final   Candida parapsilosis NOT DETECTED NOT DETECTED Final   Candida tropicalis NOT DETECTED NOT DETECTED Final    Comment: Performed at [facility], [place] [address] [place], [place] [address]  Urine culture     Status: Abnormal   Collection Time: 07/06/18  7:43 PM  Result Value Ref Range Status   Specimen Description   Final    URINE, CLEAN CATCH Performed at [facility], [place] [address], [place], [place] [address]    Special Requests   Final    NONE Performed at [facility], [place] [address] [place], [place] [address]    Culture (A)  Final    >=100,000 COLONIES/mL MULTIPLE SPECIES PRESENT, SUGGEST RECOLLECTION   Report Status 07/08/2018 FINAL  Final  SARS Coronavirus 2 (CEPHEID - Performed in [place] hospital lab), Hosp Order     Status: None   Collection Time:  07/06/18  8:11 PM  Result Value Ref Range Status   SARS Coronavirus 2 NEGATIVE NEGATIVE Final    Comment: (NOTE) If result is NEGATIVE SARS-CoV-2 target nucleic acids are NOT DETECTED. The SARS-CoV-2 RNA is generally detectable in upper and lower  respiratory specimens during the acute phase of infection. The lowest  concentration of SARS-CoV-2 viral copies this assay can detect is 250  copies / mL. A negative result does not preclude SARS-CoV-2 infection  and should not be used as  the sole basis for treatment or other  patient management decisions.  A negative result may occur with  improper specimen collection / handling, submission of specimen other  than nasopharyngeal swab, presence of viral mutation(s) within the  areas targeted by this assay, and inadequate number of viral copies  (<250 copies / mL). A negative result must be combined with clinical  observations, patient history, and epidemiological information. If result is POSITIVE SARS-CoV-2 target nucleic acids are DETECTED. The SARS-CoV-2 RNA is generally detectable in upper and lower  respiratory specimens dur ing the acute phase of infection.  Positive  results are indicative of active infection with SARS-CoV-2.  Clinical  correlation with patient history and other diagnostic information is  necessary to determine patient infection status.  Positive results do  not rule out bacterial infection or co-infection with other viruses. If result is PRESUMPTIVE POSTIVE SARS-CoV-2 nucleic acids MAY BE PRESENT.   A presumptive positive result was obtained on the submitted specimen  and confirmed on repeat testing.  While 2019 novel coronavirus  (SARS-CoV-2) nucleic acids may be present in the submitted sample  additional confirmatory testing may be necessary for epidemiological  and / or clinical management purposes  to differentiate between  SARS-CoV-2 and other Sarbecovirus currently known to infect humans.  If clinically indicated additional testing with an alternate test  methodology [id number]) is advised. The SARS-CoV-2 RNA is generally  detectable in upper and lower respiratory sp ecimens during the acute  phase of infection. The expected result is Negative. Fact Sheet for Patients:  StrictlyIdeas.no Fact Sheet for Healthcare Providers: BankingDealers.co.za This test is not yet approved or cleared by the [place] FDA and has been authorized for  detection and/or diagnosis of SARS-CoV-2 by FDA under an Emergency Use Authorization (EUA).  This EUA will remain in effect (meaning this test can be used) for the duration of the COVID-19 declaration under Section 564(b)(1) of the Act, 21 U.S.C. section 360bbb-3(b)(1), unless the authorization is terminated or revoked sooner. Performed at [facility], [place] [address], [place], [place] [address]     Please note: You were cared for by a hospitalist during your hospital stay. Once you are discharged, your primary care physician will handle any further medical issues. Please note that NO REFILLS for any discharge medications will be authorized once you are discharged, as it is imperative that you return to your primary care physician (or establish a relationship with a primary care physician if you do not have one) for your post hospital discharge needs so that they can reassess your need for medications and monitor your lab values.    Time coordinating discharge: 40 minutes  SIGNED:   Shelly Coss, MD       [facility] 06/28/2018, 10:44 AM Pager [phone]  If 7PM-7AM, please contact night-coverage [email or web address] Password [email or web address]    Electronically signed by Shelly Coss, MD at 07/05/2018 10:50 AM Electronically signed by Shelly Coss, MD at 07/01/2018 10:50 AM Electronically signed by  Shelly Coss, MD at 07/20/2018 10:51 AM     ED to Hosp-Admission (Discharged) on 07/06/2018       Revision History       Detailed Report

## 2018-07-15 LAB — GASTROINTESTINAL PANEL BY PCR, STOOL (REPLACES STOOL CULTURE)

## 2018-07-15 LAB — CBC WITH DIFFERENTIAL/PLATELET
Abs Immature Granulocytes: 0.13 10*3/uL — ABNORMAL HIGH (ref 0.00–0.07)
Basophils Absolute: 0 10*3/uL (ref 0.0–0.1)
Basophils Relative: 0 %
Eosinophils Absolute: 0 10*3/uL (ref 0.0–0.5)
Eosinophils Relative: 0 %
HCT: 33.7 % — ABNORMAL LOW (ref 36.0–46.0)
Hemoglobin: 9.6 g/dL — ABNORMAL LOW (ref 12.0–15.0)
Immature Granulocytes: 1 %
Lymphocytes Relative: 6 %
Lymphs Abs: 1.2 10*3/uL (ref 0.7–4.0)
MCH: 26 pg (ref 26.0–34.0)
MCHC: 28.5 g/dL — ABNORMAL LOW (ref 30.0–36.0)
MCV: 91.3 fL (ref 80.0–100.0)
Monocytes Absolute: 1.2 10*3/uL — ABNORMAL HIGH (ref 0.1–1.0)
Monocytes Relative: 6 %
Neutro Abs: 16.3 10*3/uL — ABNORMAL HIGH (ref 1.7–7.7)
Neutrophils Relative %: 87 %
Platelets: 226 10*3/uL (ref 150–400)
RBC: 3.69 MIL/uL — ABNORMAL LOW (ref 3.87–5.11)
RDW: 17.6 % — ABNORMAL HIGH (ref 11.5–15.5)
WBC: 18.8 10*3/uL — ABNORMAL HIGH (ref 4.0–10.5)
nRBC: 0 % (ref 0.0–0.2)

## 2018-07-15 LAB — GLUCOSE, CAPILLARY
Glucose-Capillary: 244 mg/dL — ABNORMAL HIGH (ref 70–99)
Glucose-Capillary: 205 mg/dL — ABNORMAL HIGH (ref 70–99)
Glucose-Capillary: 138 mg/dL — ABNORMAL HIGH (ref 70–99)
Glucose-Capillary: 133 mg/dL — ABNORMAL HIGH (ref 70–99)

## 2018-07-15 LAB — URINE CULTURE: Culture: NO GROWTH

## 2018-07-15 LAB — LACTIC ACID, PLASMA: Lactic Acid, Venous: 1.8 mmol/L (ref 0.5–1.9)

## 2018-07-15 LAB — COMPREHENSIVE METABOLIC PANEL
ALT: 26 U/L (ref 0–44)
AST: 29 U/L (ref 15–41)
Albumin: 2.5 g/dL — ABNORMAL LOW (ref 3.5–5.0)
Alkaline Phosphatase: 60 U/L (ref 38–126)
Anion gap: 10 (ref 5–15)
BUN: 54 mg/dL — ABNORMAL HIGH (ref 8–23)
CO2: 16 mmol/L — ABNORMAL LOW (ref 22–32)
Calcium: 8 mg/dL — ABNORMAL LOW (ref 8.9–10.3)
Chloride: 119 mmol/L — ABNORMAL HIGH (ref 98–111)
Creatinine, Ser: 1.93 mg/dL — ABNORMAL HIGH (ref 0.44–1.00)
GFR calc Af Amer: 28 mL/min — ABNORMAL LOW (ref >60)
GFR calc non Af Amer: 24 mL/min — ABNORMAL LOW (ref >60)
Glucose, Bld: 248 mg/dL — ABNORMAL HIGH (ref 70–99)
Potassium: 3.5 mmol/L (ref 3.5–5.1)
Sodium: 145 mmol/L (ref 135–145)
Total Bilirubin: 1.1 mg/dL (ref 0.3–1.2)
Total Protein: 6 g/dL — ABNORMAL LOW (ref 6.5–8.1)

## 2018-07-15 LAB — C DIFFICILE QUICK SCREEN W PCR REFLEX
C Diff antigen: NEGATIVE
C Diff interpretation: NOT DETECTED
C Diff toxin: NEGATIVE

## 2018-07-15 MED ORDER — STERILE WATER FOR INJECTION IV SOLN
INTRAVENOUS | Status: DC
  Administered 2018-07-15 – 2018-07-16 (×3): via INTRAVENOUS
  Filled 2018-07-15 (×5): qty 850

## 2018-07-15 MED ORDER — SODIUM CHLORIDE 0.9 % IV BOLUS
1000.0000 mL | Freq: Once | INTRAVENOUS | Status: AC
  Administered 2018-07-15: 1000 mL via INTRAVENOUS

## 2018-07-15 NOTE — Progress Notes (Signed)
PROGRESS NOTE    [name]  BHA:[id number] DOB: [birth year] DOA: 07/17/2018 PCP: [name], MD   Brief Narrative: Patient is a61 year old female with history of advanced dementia, diabetes mellitus type 2, hypertension, recurrent UTIs, dysphagia, indwelling Foley catheter at baseline who was brought to the emergency department with complaints of altered mental status.  She was complaining of abdomen pain at home, was vomiting.  She was recently admitted on 07/06/2018 and was discharged on 07/12/2018 after being treated for severe sepsis secondary to catheter associated urinary tract infection and was treated with IV antibiotics. CT abdomen/pelvis done on presentation showed dilated gallbladder with gallstones and features highly suspicious for acute cholecystitis.  General surgery consulted.  IR did percutaneous drain/cholecystostomy on 07/14/18.  Started on antibiotics.  Also suspected to be in DKA on presentation and started on insulin drip.   This morning she is more stable hemodynamically.  She is having diarrhea now.  C. difficile/GI pathogen panel sent.  Assessment & Plan:   Active Problems:   Sepsis ([place])  Suspected Severe sepsis:Secondary to acute cholecystitis.Presents with fever, hypotension, tachycardia, leukocytosis, lactic acidosis.  Continue broad-spectrum antibiotics.  Follow-up cultures.  Continue IV fluids  Acute cholecystitis: CT scan showed dilated gallbladder with gallstones, considerable pericholecystitic inflammatory changes consistent with acute cholecystitis . General surgery consulted.  IR did percutaneous drain/cholecystostomy .  Continue n.p.o. status.  Continue IV fluids, antibiotics   Metabolic encephalopathy: From sepsis .Patient also has underlying dementia.  Anion gap metabolic acidosis: Resolved.Likely from lactic acidosis but glucose was also elevated at 542. DKA  suspected.  Started on insulin drip. Gap closed  .Drip stopped.  Diarrhea: Rectal  tube placed.  C. difficile/GI pathogen panel sent.  Acute kidney injury/non-anion gap metabolic acidosis:Patient's baseline creatinine 0.7, she presented with AKI.  Low bicarb.  Started on bicarb drip.  Dysphagia:On last admission,speech therapy was consulted for dysphagia, patient started on dysphagia 1 diet.  Hypertension: BP soft  at present.  Hold antihypertensives  Diabetes mellitus type 2: On metformin at home.  Continue sliding scale insulin.    Nutrition Problem: Increased nutrient needs Etiology: acute illness(severe sepsis)      DVT prophylaxis:ScD Code Status: DNR Family Communication: Discussed with the daughter on phone yesterday Disposition Plan: Undetermined at this point   Consultants: General surgery, IR  Procedures: Cholecystostomy  Antimicrobials:  Anti-infectives (From admission, onward)   Start     Dose/Rate Route Frequency Ordered Stop   07/15/18 2200  vancomycin (VANCOCIN) 1,500 mg in sodium chloride 0.9 % 500 mL IVPB     1,500 mg 250 mL/hr over 120 Minutes Intravenous Every 48 hours 07/14/18 0506     07/14/18 2000  ceFEPIme (MAXIPIME) 2 g in sodium chloride 0.9 % 100 mL IVPB     2 g 200 mL/hr over 30 Minutes Intravenous Every 24 hours 07/14/18 0506     07/14/18 0600  metroNIDAZOLE (FLAGYL) IVPB 500 mg     500 mg 100 mL/hr over 60 Minutes Intravenous Every 8 hours 07/14/18 0117     07/21/2018 2345  vancomycin (VANCOCIN) IVPB 750 mg/150 ml premix     750 mg 150 mL/hr over 60 Minutes Intravenous  Once 06/29/2018 2335 07/14/18 0234   07/15/2018 2000  ceFEPIme (MAXIPIME) 2 g in sodium chloride 0.9 % 100 mL IVPB     2 g 200 mL/hr over 30 Minutes Intravenous  Once 06/23/2018 1956 07/02/2018 2129   07/05/2018 2000  metroNIDAZOLE (FLAGYL) IVPB 500 mg     500 mg  100 mL/hr over 60 Minutes Intravenous  Once 06/23/2018 1956 07/14/2018 2223   07/09/2018 2000  vancomycin (VANCOCIN) IVPB 1000 mg/200 mL premix     1,000 mg 200 mL/hr over 60 Minutes Intravenous  Once  07/08/2018 1956 06/26/2018 2331      Subjective: Patient seen and examined at the bedside this morning.  More hemodynamically stable.  Remains sleepy/drowsy.  Has been having diarrhea.  No fever today.   Objective: Vitals:   07/15/18 0700 07/15/18 0800 07/15/18 0847 07/15/18 0900  BP: 102/61 114/62  (!) 112/47  Pulse: 95 93  (!) 103  Resp: (!) 22 19  (!) 34  Temp:   98.9 F (37.2 C)   TempSrc:   Axillary   SpO2: 97% 98%  100%  Weight:      Height:        Intake/Output Summary (Last 24 hours) at 07/15/2018 1018 Last data filed at 07/15/2018 0900 Gross per 24 hour  Intake 4096.23 ml  Output 1180 ml  Net 2916.23 ml   Filed Weights   07/14/18 0119  Weight: 81.9 kg    Examination:   General exam: Not in distress, lethargic, weak HEENT:PERRL,Oral mucosa dry, Ear/Nose normal on gross exam Respiratory system: Bilateral equal air entry, normal vesicular breath sounds, no wheezes or crackles  Cardiovascular system: Sinus tachycardia. No JVD, murmurs, rubs, gallops or clicks. Gastrointestinal system: Abdomen is nondistended, soft and nontender. No organomegaly or masses felt. Normal bowel sounds heard. Central nervous system: Not alert and oriented. No focal neurological deficits. Extremities: No edema, no clubbing ,no cyanosis, distal peripheral pulses palpable. Skin: No rashes, lesions or ulcers,no icterus ,no pallor  Data Reviewed: I have personally reviewed following labs and imaging studies  CBC: Recent Labs  Lab 07/10/18 0859 07/10/2018 2012 07/14/18 0538 07/15/18 0313  WBC 8.3 26.9* 21.6* 18.8*  NEUTROABS  --  24.7*  --  16.3*  HGB 10.2* 12.5 10.6* 9.6*  HCT 35.2* 39.8 35.6* 33.7*  MCV 88.4 83.4 86.0 91.3  PLT 283 405* 307 607   Basic Metabolic Panel: Recent Labs  Lab 07/12/2018 2012 07/14/18 0538 07/14/18 0829 07/14/18 1256 07/14/18 1500 07/14/18 1506 07/15/18 0313  NA 143 146* 135 145  --   --  145  K 3.9 2.7* 14.3* 5.1  --  4.7 3.5  CL 105 111 113* 113*   --   --  119*  CO2 18* 19* 20* 23  --   --  16*  GLUCOSE 542* 396* 178* 119*  --   --  248*  BUN 36* 43* 42* 49*  --   --  54*  CREATININE 1.75* 1.82* 1.61* 1.69*  --   --  1.93*  CALCIUM 9.3 8.5* 7.4* 8.2*  --   --  8.0*  MG  --   --   --   --  1.5*  --   --    GFR: Estimated Creatinine Clearance: 26 mL/min (A) (by C-G formula based on SCr of 1.93 mg/dL (H)). Liver Function Tests: Recent Labs  Lab 07/10/18 0859 06/24/2018 2012 07/14/18 0538 07/15/18 0313  AST 16 17 23 29   ALT 18 18 20 26   ALKPHOS 60 86 67 60  BILITOT 0.7 2.0* 0.8 1.1  PROT 6.3* 7.3 6.3* 6.0*  ALBUMIN 3.0* 3.3* 2.7* 2.5*   No results for input(s): LIPASE, AMYLASE in the last 168 hours. No results for input(s): AMMONIA in the last 168 hours. Coagulation Profile: Recent Labs  Lab 07/14/18 0829  INR 1.5*  Cardiac Enzymes: No results for input(s): CKTOTAL, CKMB, CKMBINDEX, TROPONINI in the last 168 hours. BNP (last 3 results) No results for input(s): PROBNP in the last 8760 hours. HbA1C: No results for input(s): HGBA1C in the last 72 hours. CBG: Recent Labs  Lab 07/14/18 1255 07/14/18 1359 07/14/18 1645 07/14/18 2211 07/15/18 0822  GLUCAP 108* 144* 194* 197* 244*   Lipid Profile: No results for input(s): CHOL, HDL, LDLCALC, TRIG, CHOLHDL, LDLDIRECT in the last 72 hours. Thyroid Function Tests: No results for input(s): TSH, T4TOTAL, FREET4, T3FREE, THYROIDAB in the last 72 hours. Anemia Panel: No results for input(s): VITAMINB12, FOLATE, FERRITIN, TIBC, IRON, RETICCTPCT in the last 72 hours. Sepsis Labs: Recent Labs  Lab 06/29/2018 2012 07/15/2018 2333 07/15/18 0313  LATICACIDVEN 4.0* 1.9 1.8    Recent Results (from the past 240 hour(s))  Blood culture (routine x 2)     Status: None   Collection Time: 07/06/18  6:06 PM  Result Value Ref Range Status   Specimen Description BLOOD RIGHT HAND  Final   Special Requests   Final    BOTTLES DRAWN AEROBIC AND ANAEROBIC Blood Culture results may not  be optimal due to an inadequate volume of blood received in culture bottles   Culture   Final    NO GROWTH 5 DAYS Performed at [facility], [place] [address], [place], [place] [address]    Report Status 07/10/2018 FINAL  Final  Blood culture (routine x 2)     Status: Abnormal   Collection Time: 07/06/18  6:13 PM  Result Value Ref Range Status   Specimen Description BLOOD LEFT HAND  Final   Special Requests   Final    BOTTLES DRAWN AEROBIC AND ANAEROBIC Blood Culture adequate volume   Culture  Setup Time   Final    ANAEROBIC BOTTLE ONLY GRAM POSITIVE COCCI IN PAIRS RESULT CALLED TO, READ BACK BY AND VERIFIED WITH: BETH GREENE @ 4854 ON 07/08/18 BY ROBINSON Z.  AEROBIC BOTTLE ONLY GRAM POSITIVE RODS CRITICAL RESULT CALLED TO, READ BACK BY AND VERIFIED WITH: J GRIMSLEY PHARMD 07/10/18 2340 JDW    Culture (A)  Final    STAPHYLOCOCCUS SPECIES (COAGULASE NEGATIVE) THE SIGNIFICANCE OF ISOLATING THIS ORGANISM FROM A SINGLE SET OF BLOOD CULTURES WHEN MULTIPLE SETS ARE DRAWN IS UNCERTAIN. PLEASE NOTIFY THE MICROBIOLOGY DEPARTMENT WITHIN ONE WEEK IF SPECIATION AND SENSITIVITIES ARE REQUIRED. DIPHTHEROIDS(CORYNEBACTERIUM SPECIES) Standardized susceptibility testing for this organism is not available. Performed at [facility], [place] [address] [place],  [address]    Report Status 07/14/2018 FINAL  Final  Blood Culture ID Panel (Reflexed)     Status: Abnormal   Collection Time: 07/06/18  6:13 PM  Result Value Ref Range Status   Enterococcus species NOT DETECTED NOT DETECTED Final   Listeria monocytogenes NOT DETECTED NOT DETECTED Final   Staphylococcus species DETECTED (A) NOT DETECTED Final    Comment: Methicillin (oxacillin) resistant coagulase negative staphylococcus. Possible blood culture contaminant (unless isolated from more than one blood culture draw or clinical case suggests pathogenicity). No antibiotic treatment is indicated for blood  culture contaminants. CRITICAL  RESULT CALLED TO, READ BACK BY AND VERIFIED WITH: BETH GREENE @ 0056 ON 07/08/18 BY ROBINSON Z.     Staphylococcus aureus (BCID) NOT DETECTED NOT DETECTED Final   Methicillin resistance DETECTED (A) NOT DETECTED Final    Comment: CRITICAL RESULT CALLED TO, READ BACK BY AND VERIFIED WITH: BETH GREENE @ 0056 ON 07/08/18 BY ROBINSON Z.  Streptococcus species NOT DETECTED NOT DETECTED Final   Streptococcus agalactiae NOT DETECTED NOT DETECTED Final   Streptococcus pneumoniae NOT DETECTED NOT DETECTED Final   Streptococcus pyogenes NOT DETECTED NOT DETECTED Final   Acinetobacter baumannii NOT DETECTED NOT DETECTED Final   Enterobacteriaceae species NOT DETECTED NOT DETECTED Final   Enterobacter cloacae complex NOT DETECTED NOT DETECTED Final   Escherichia coli NOT DETECTED NOT DETECTED Final   Klebsiella oxytoca NOT DETECTED NOT DETECTED Final   Klebsiella pneumoniae NOT DETECTED NOT DETECTED Final   Proteus species NOT DETECTED NOT DETECTED Final   Serratia marcescens NOT DETECTED NOT DETECTED Final   Haemophilus influenzae NOT DETECTED NOT DETECTED Final   Neisseria meningitidis NOT DETECTED NOT DETECTED Final   Pseudomonas aeruginosa NOT DETECTED NOT DETECTED Final   Candida albicans NOT DETECTED NOT DETECTED Final   Candida glabrata NOT DETECTED NOT DETECTED Final   Candida krusei NOT DETECTED NOT DETECTED Final   Candida parapsilosis NOT DETECTED NOT DETECTED Final   Candida tropicalis NOT DETECTED NOT DETECTED Final    Comment: Performed at [facility], [place] [address], [place], [place] [address]  Urine culture     Status: Abnormal   Collection Time: 07/06/18  7:43 PM  Result Value Ref Range Status   Specimen Description   Final    URINE, CLEAN CATCH Performed at [facility], [place] [address] [place], [place] [address]    Special Requests   Final    NONE Performed at [facility], [place] [address], [place], [place] [address]     Culture (A)  Final    >=100,000 COLONIES/mL MULTIPLE SPECIES PRESENT, SUGGEST RECOLLECTION   Report Status 07/08/2018 FINAL  Final  SARS Coronavirus 2 (CEPHEID - Performed in [facility] hospital lab), Hosp Order     Status: None   Collection Time: 07/06/18  8:11 PM  Result Value Ref Range Status   SARS Coronavirus 2 NEGATIVE NEGATIVE Final    Comment: (NOTE) If result is NEGATIVE SARS-CoV-2 target nucleic acids are NOT DETECTED. The SARS-CoV-2 RNA is generally detectable in upper and lower  respiratory specimens during the acute phase of infection. The lowest  concentration of SARS-CoV-2 viral copies this assay can detect is 250  copies / mL. A negative result does not preclude SARS-CoV-2 infection  and should not be used as the sole basis for treatment or other  patient management decisions.  A negative result may occur with  improper specimen collection / handling, submission of specimen other  than nasopharyngeal swab, presence of viral mutation(s) within the  areas targeted by this assay, and inadequate number of viral copies  (<250 copies / mL). A negative result must be combined with clinical  observations, patient history, and epidemiological information. If result is POSITIVE SARS-CoV-2 target nucleic acids are DETECTED. The SARS-CoV-2 RNA is generally detectable in upper and lower  respiratory specimens dur ing the acute phase of infection.  Positive  results are indicative of active infection with SARS-CoV-2.  Clinical  correlation with patient history and other diagnostic information is  necessary to determine patient infection status.  Positive results do  not rule out bacterial infection or co-infection with other viruses. If result is PRESUMPTIVE POSTIVE SARS-CoV-2 nucleic acids MAY BE PRESENT.   A presumptive positive result was obtained on the submitted specimen  and confirmed on repeat testing.  While 2019 novel coronavirus  (SARS-CoV-2) nucleic acids may be  present in the submitted sample  additional confirmatory  testing may be necessary for epidemiological  and / or clinical management purposes  to differentiate between  SARS-CoV-2 and other Sarbecovirus currently known to infect humans.  If clinically indicated additional testing with an alternate test  methodology [phone]) is advised. The SARS-CoV-2 RNA is generally  detectable in upper and lower respiratory sp ecimens during the acute  phase of infection. The expected result is Negative. Fact Sheet for Patients:  StrictlyIdeas.no Fact Sheet for Healthcare Providers: BankingDealers.co.za This test is not yet approved or cleared by the [place] FDA and has been authorized for detection and/or diagnosis of SARS-CoV-2 by FDA under an Emergency Use Authorization (EUA).  This EUA will remain in effect (meaning this test can be used) for the duration of the COVID-19 declaration under Section 564(b)(1) of the Act, 21 U.S.C. section 360bbb-3(b)(1), unless the authorization is terminated or revoked sooner. Performed at [facility], [place] [address] [place], [place] [address]   Urine culture     Status: None   Collection Time: 07/18/2018  8:12 PM  Result Value Ref Range Status   Specimen Description   Final    URINE, CATHETERIZED Performed at [place] [address], [place], [place] [address]    Special Requests   Final    NONE Performed at [facility], [place] [address], [place], [place] [address]    Culture   Final    NO GROWTH Performed at [facility], [place] [address], [place], [place] [address]    Report Status 07/15/2018 FINAL  Final  SARS Coronavirus 2 (CEPHEID- Performed in [facility] hospital lab), Hosp Order     Status: None   Collection Time: 06/22/2018 11:33 PM  Result Value Ref Range Status   SARS Coronavirus 2 NEGATIVE NEGATIVE Final    Comment: (NOTE) If  result is NEGATIVE SARS-CoV-2 target nucleic acids are NOT DETECTED. The SARS-CoV-2 RNA is generally detectable in upper and lower  respiratory specimens during the acute phase of infection. The lowest  concentration of SARS-CoV-2 viral copies this assay can detect is 250  copies / mL. A negative result does not preclude SARS-CoV-2 infection  and should not be used as the sole basis for treatment or other  patient management decisions.  A negative result may occur with  improper specimen collection / handling, submission of specimen other  than nasopharyngeal swab, presence of viral mutation(s) within the  areas targeted by this assay, and inadequate number of viral copies  (<250 copies / mL). A negative result must be combined with clinical  observations, patient history, and epidemiological information. If result is POSITIVE SARS-CoV-2 target nucleic acids are DETECTED. The SARS-CoV-2 RNA is generally detectable in upper and lower  respiratory specimens dur ing the acute phase of infection.  Positive  results are indicative of active infection with SARS-CoV-2.  Clinical  correlation with patient history and other diagnostic information is  necessary to determine patient infection status.  Positive results do  not rule out bacterial infection or co-infection with other viruses. If result is PRESUMPTIVE POSTIVE SARS-CoV-2 nucleic acids MAY BE PRESENT.   A presumptive positive result was obtained on the submitted specimen  and confirmed on repeat testing.  While 2019 novel coronavirus  (SARS-CoV-2) nucleic acids may be present in the submitted sample  additional confirmatory testing may be necessary for epidemiological  and / or clinical management purposes  to differentiate between  SARS-CoV-2 and other Sarbecovirus currently known to infect humans.  If clinically indicated additional testing with an alternate test  methodology [phone]) is advised. The SARS-CoV-2 RNA is generally    detectable in upper and lower respiratory sp ecimens during the acute  phase of infection. The expected result is Negative. Fact Sheet for Patients:  StrictlyIdeas.no Fact Sheet for Healthcare Providers: BankingDealers.co.za This test is not yet approved or cleared by the [place] FDA and has been authorized for detection and/or diagnosis of SARS-CoV-2 by FDA under an Emergency Use Authorization (EUA).  This EUA will remain in effect (meaning this test can be used) for the duration of the COVID-19 declaration under Section 564(b)(1) of the Act, 21 U.S.C. section 360bbb-3(b)(1), unless the authorization is terminated or revoked sooner. Performed at [facility], [place] [address] [place], [place] [address]   MRSA PCR Screening     Status: None   Collection Time: 07/14/18  3:25 AM  Result Value Ref Range Status   MRSA by PCR NEGATIVE NEGATIVE Final    Comment:        The GeneXpert MRSA Assay (FDA approved for NASAL specimens only), is one component of a comprehensive MRSA colonization surveillance program. It is not intended to diagnose MRSA infection nor to guide or monitor treatment for MRSA infections. Performed at [facility], [place] [address], [place], [place] [address]   Aerobic/Anaerobic Culture (surgical/deep wound)     Status: None (Preliminary result)   Collection Time: 07/14/18 10:14 AM  Result Value Ref Range Status   Specimen Description   Final    WOUND Performed at [place] [address] [place], [place] [address]    Special Requests   Final    NONE Performed at [facility], [place] [address], [place], [place] [address]    Gram Stain   Final    NO WBC SEEN NO ORGANISMS SEEN Performed at [facility], [place] [address] [place], [place] [address]    Culture PENDING  Incomplete   Report Status PENDING  Incomplete          Radiology Studies: Ct Abdomen Pelvis Wo Contrast  Result Date: 07/12/2018 CLINICAL DATA:  History of recent UTI with generalized abdominal pain EXAM: CT ABDOMEN AND PELVIS WITHOUT CONTRAST TECHNIQUE: Multidetector CT imaging of the abdomen and pelvis was performed following the standard protocol without IV contrast. COMPARISON:  08/10/2016 FINDINGS: Lower chest: Mild scarring is noted in the bases bilaterally. No focal infiltrate or effusion is seen. Hepatobiliary: Gallbladder is significantly distended with pericholecystic inflammatory change and dependent cholelithiasis. These changes are consistent with acute cholecystitis till proven otherwise. Ultrasound may be helpful for further evaluation. Pancreas: Unremarkable. No pancreatic ductal dilatation or surrounding inflammatory changes. Spleen: Normal in size without focal abnormality. Adrenals/Urinary Tract: Adrenal glands are within normal limits bilaterally. Kidneys are unremarkable. No renal calculi or obstructive changes are noted. The bladder is decompressed by Foley catheter. Stomach/Bowel: Contrast is noted throughout the large bowel. No obstructive or inflammatory changes are seen. The appendix is not well visualized although no inflammatory changes are seen. Stomach is distended with fluid with evidence of reflux into the distal esophagus. No small bowel abnormality is noted. Vascular/Lymphatic: Aortic atherosclerosis. No enlarged abdominal or pelvic lymph nodes. Reproductive: Status post hysterectomy. No adnexal masses. Other: No abdominal wall hernia or abnormality. No abdominopelvic ascites. Musculoskeletal: Right hip prosthesis is noted. Degenerative changes of lumbar spine are noted. Prior vertebral augmentation at L3 is noted. No acute abnormality is seen. IMPRESSION: Significantly  dilated gallbladder with gallstones within. Considerable peri cholecystic inflammatory changes are noted consistent with acute cholecystitis. Right upper  quadrant ultrasound may be helpful for further evaluation. Chronic changes as described above. Electronically Signed   By: Inez Catalina M.D.   On: 07/03/2018 20:47   Ct Head Wo Contrast  Result Date: 07/21/2018 CLINICAL DATA:  Altered mental status, history of UTIs EXAM: CT HEAD WITHOUT CONTRAST TECHNIQUE: Contiguous axial images were obtained from the base of the skull through the vertex without intravenous contrast. COMPARISON:  04/19/2018 FINDINGS: Brain: Chronic atrophic changes are noted with ventriculomegaly. Changes of chronic white matter ischemic change are again noted. No findings to suggest acute hemorrhage, acute infarction or space-occupying mass lesion are noted. Vascular: No hyperdense vessel or unexpected calcification. Skull: Normal. Negative for fracture or focal lesion. Sinuses/Orbits: No acute finding. Other: None. IMPRESSION: Atrophic and ischemic changes without acute abnormality. Electronically Signed   By: Inez Catalina M.D.   On: 07/15/2018 20:41   Ir Perc Cholecystostomy  Result Date: 07/14/2018 CLINICAL DATA:  Acute cholecystitis, sepsis, dementia EXAM: PERCUTANEOUS CHOLECYSTOSTOMY TUBE PLACEMENT WITH ULTRASOUND AND FLUOROSCOPIC GUIDANCE FLUOROSCOPY TIME:  0.7 minutes; 116 uGym2 DAP TECHNIQUE: The procedure, risks (including but not limited to bleeding, infection, organ damage ), benefits, and alternatives were explained to the daughter over the telephone. Questions regarding the procedure were encouraged and answered. The daughter understands and consents to the procedure. Survey ultrasound of the abdomen was performed and an appropriate skin entry site was identified. Skin site was marked, prepped with chlorhexidine, and draped in usual sterile fashion, and infiltrated locally with 1% lidocaine. Intravenous Fentanyl 3mcg and Versed 0.5mg  were administered as conscious sedation during continuous monitoring of the patient's level of consciousness and physiological /  cardiorespiratory status by the radiology RN, with a total moderate sedation time of 11 minutes. Under real-time ultrasound guidance, gallbladder was accessed using a transhepatic approach with a 21-gauge needle. Ultrasound image documentation was saved. Bile returned through the hub. Needle was exchanged over a 018 guidewire for transitional dilator which allowed placement of 035 J wire. Over this, a 10.2 French pigtail catheter was advanced and formed centrally in the gallbladder lumen. 20 mL of dark green bilious aspirate sent for Gram stain and culture. Small contrast injection confirmed appropriate position. Catheter secured externally with 0 Prolene suture and placed external drain bag. Patient tolerated the procedure well. COMPLICATIONS: COMPLICATIONS none IMPRESSION: 1. Technically successful percutaneous cholecystostomy tube placement with ultrasound and fluoroscopic guidance. Electronically Signed   By: Lucrezia Europe M.D.   On: 07/14/2018 11:26   Dg Chest Port 1 View  Result Date: 07/09/2018 CLINICAL DATA:  Altered mental status, history of recent UTI, possible sepsis EXAM: PORTABLE CHEST 1 VIEW COMPARISON:  07/06/2018 FINDINGS: The heart size and mediastinal contours are within normal limits. Both lungs are clear. The visualized skeletal structures are unremarkable. IMPRESSION: No active disease. Electronically Signed   By: Inez Catalina M.D.   On: 07/04/2018 20:48        Scheduled Meds:  Chlorhexidine Gluconate Cloth  6 each Topical Daily   insulin aspart  0-5 Units Subcutaneous QHS   insulin aspart  0-9 Units Subcutaneous TID WC   mouth rinse  15 mL Mouth Rinse BID   sodium chloride flush  5 mL Intracatheter Q8H   Continuous Infusions:  ceFEPime (MAXIPIME) IV Stopped (07/14/18 1933)   metronidazole Stopped (07/15/18 0718)    sodium bicarbonate (isotonic) infusion in sterile water 100 mL/hr at 07/15/18 0900  sodium chloride Stopped (07/15/18 0031)   vancomycin       LOS:  2 days    Time spent: 35 mins.More than 50% of that time was spent in counseling and/or coordination of care.      Shelly Coss, MD [facility] Pager [phone]  If 7PM-7AM, please contact night-coverage [email or web address] Password [email or web address] 07/15/2018, 10:18 AM

## 2018-07-15 NOTE — Progress Notes (Signed)
IR rounding note via telephone per new regulations. Spoke with Shirlean Mylar, RN.  Patient with history of acute cholecystitis s/p percutaneous cholecystostomy drain placement 07/14/2018 by Dr. Vernard Gambles.  RN states cholecystostomy drain site c/d/i with approximately 40 cc of dark green bile in gravity bag. States drain flushes without resistance.  Continue current drain management- continue with Qshift flushes and monitor of output. Appreciate and agree with TRH/CCS management. IR to follow.   Bea Graff Guiselle Mian, PA-C 07/15/2018, 1:23 PM

## 2018-07-15 NOTE — Progress Notes (Signed)
Subjective/Chief Complaint: More arousable today. Still does not interact   Objective: Vital signs in last 24 hours: Temp:  [98.7 F (37.1 C)-102.6 F (39.2 C)] 98.7 F (37.1 C) (05/24 0400) Pulse Rate:  [66-144] 95 (05/24 0700) Resp:  [17-41] 22 (05/24 0700) BP: (64-139)/(35-84) 102/61 (05/24 0700) SpO2:  [93 %-100 %] 97 % (05/24 0700) FiO2 (%):  [4 %] 4 % (05/23 1005) Last BM Date: 07/15/18  Intake/Output from previous day: 05/23 0701 - 05/24 0700 In: 4880.3 [I.V.:1826; IV Piggyback:3024.3] Out: 1180 [Urine:330; Drains:150; Stool:700] Intake/Output this shift: No intake/output data recorded.  General appearance: arousable and opens eyes Resp: clear to auscultation bilaterally Cardio: regular rate and rhythm GI: soft, no obvious tenderness. perc drain with bile output  Lab Results:  Recent Labs    07/14/18 0538 07/15/18 0313  WBC 21.6* 18.8*  HGB 10.6* 9.6*  HCT 35.6* 33.7*  PLT 307 226   BMET Recent Labs    07/14/18 1256 07/14/18 1506 07/15/18 0313  NA 145  --  145  K 5.1 4.7 3.5  CL 113*  --  119*  CO2 23  --  16*  GLUCOSE 119*  --  248*  BUN 49*  --  54*  CREATININE 1.69*  --  1.93*  CALCIUM 8.2*  --  8.0*   PT/INR Recent Labs    07/14/18 0829  LABPROT 18.2*  INR 1.5*   ABG No results for input(s): PHART, HCO3 in the last 72 hours.  Invalid input(s): PCO2, PO2  Studies/Results: Ct Abdomen Pelvis Wo Contrast  Result Date: 06/28/2018 CLINICAL DATA:  History of recent UTI with generalized abdominal pain EXAM: CT ABDOMEN AND PELVIS WITHOUT CONTRAST TECHNIQUE: Multidetector CT imaging of the abdomen and pelvis was performed following the standard protocol without IV contrast. COMPARISON:  08/10/2016 FINDINGS: Lower chest: Mild scarring is noted in the bases bilaterally. No focal infiltrate or effusion is seen. Hepatobiliary: Gallbladder is significantly distended with pericholecystic inflammatory change and dependent cholelithiasis. These  changes are consistent with acute cholecystitis till proven otherwise. Ultrasound may be helpful for further evaluation. Pancreas: Unremarkable. No pancreatic ductal dilatation or surrounding inflammatory changes. Spleen: Normal in size without focal abnormality. Adrenals/Urinary Tract: Adrenal glands are within normal limits bilaterally. Kidneys are unremarkable. No renal calculi or obstructive changes are noted. The bladder is decompressed by Foley catheter. Stomach/Bowel: Contrast is noted throughout the large bowel. No obstructive or inflammatory changes are seen. The appendix is not well visualized although no inflammatory changes are seen. Stomach is distended with fluid with evidence of reflux into the distal esophagus. No small bowel abnormality is noted. Vascular/Lymphatic: Aortic atherosclerosis. No enlarged abdominal or pelvic lymph nodes. Reproductive: Status post hysterectomy. No adnexal masses. Other: No abdominal wall hernia or abnormality. No abdominopelvic ascites. Musculoskeletal: Right hip prosthesis is noted. Degenerative changes of lumbar spine are noted. Prior vertebral augmentation at L3 is noted. No acute abnormality is seen. IMPRESSION: Significantly dilated gallbladder with gallstones within. Considerable peri cholecystic inflammatory changes are noted consistent with acute cholecystitis. Right upper quadrant ultrasound may be helpful for further evaluation. Chronic changes as described above. Electronically Signed   By: Inez Catalina M.D.   On: 06/23/2018 20:47   Ct Head Wo Contrast  Result Date: 07/04/2018 CLINICAL DATA:  Altered mental status, history of UTIs EXAM: CT HEAD WITHOUT CONTRAST TECHNIQUE: Contiguous axial images were obtained from the base of the skull through the vertex without intravenous contrast. COMPARISON:  04/19/2018 FINDINGS: Brain: Chronic atrophic changes are noted with  ventriculomegaly. Changes of chronic white matter ischemic change are again noted. No findings  to suggest acute hemorrhage, acute infarction or space-occupying mass lesion are noted. Vascular: No hyperdense vessel or unexpected calcification. Skull: Normal. Negative for fracture or focal lesion. Sinuses/Orbits: No acute finding. Other: None. IMPRESSION: Atrophic and ischemic changes without acute abnormality. Electronically Signed   By: Inez Catalina M.D.   On: 07/06/2018 20:41   Ir Perc Cholecystostomy  Result Date: 07/14/2018 CLINICAL DATA:  Acute cholecystitis, sepsis, dementia EXAM: PERCUTANEOUS CHOLECYSTOSTOMY TUBE PLACEMENT WITH ULTRASOUND AND FLUOROSCOPIC GUIDANCE FLUOROSCOPY TIME:  0.7 minutes; 116 uGym2 DAP TECHNIQUE: The procedure, risks (including but not limited to bleeding, infection, organ damage ), benefits, and alternatives were explained to the daughter over the telephone. Questions regarding the procedure were encouraged and answered. The daughter understands and consents to the procedure. Survey ultrasound of the abdomen was performed and an appropriate skin entry site was identified. Skin site was marked, prepped with chlorhexidine, and draped in usual sterile fashion, and infiltrated locally with 1% lidocaine. Intravenous Fentanyl 85mcg and Versed 0.5mg  were administered as conscious sedation during continuous monitoring of the patient's level of consciousness and physiological / cardiorespiratory status by the radiology RN, with a total moderate sedation time of 11 minutes. Under real-time ultrasound guidance, gallbladder was accessed using a transhepatic approach with a 21-gauge needle. Ultrasound image documentation was saved. Bile returned through the hub. Needle was exchanged over a 018 guidewire for transitional dilator which allowed placement of 035 J wire. Over this, a 10.2 French pigtail catheter was advanced and formed centrally in the gallbladder lumen. 20 mL of dark green bilious aspirate sent for Gram stain and culture. Small contrast injection confirmed appropriate  position. Catheter secured externally with 0 Prolene suture and placed external drain bag. Patient tolerated the procedure well. COMPLICATIONS: COMPLICATIONS none IMPRESSION: 1. Technically successful percutaneous cholecystostomy tube placement with ultrasound and fluoroscopic guidance. Electronically Signed   By: Lucrezia Europe M.D.   On: 07/14/2018 11:26   Dg Chest Port 1 View  Result Date: 07/08/2018 CLINICAL DATA:  Altered mental status, history of recent UTI, possible sepsis EXAM: PORTABLE CHEST 1 VIEW COMPARISON:  07/06/2018 FINDINGS: The heart size and mediastinal contours are within normal limits. Both lungs are clear. The visualized skeletal structures are unremarkable. IMPRESSION: No active disease. Electronically Signed   By: Inez Catalina M.D.   On: 07/14/2018 20:48    Anti-infectives: Anti-infectives (From admission, onward)   Start     Dose/Rate Route Frequency Ordered Stop   07/15/18 2200  vancomycin (VANCOCIN) 1,500 mg in sodium chloride 0.9 % 500 mL IVPB     1,500 mg 250 mL/hr over 120 Minutes Intravenous Every 48 hours 07/14/18 0506     07/14/18 2000  ceFEPIme (MAXIPIME) 2 g in sodium chloride 0.9 % 100 mL IVPB     2 g 200 mL/hr over 30 Minutes Intravenous Every 24 hours 07/14/18 0506     07/14/18 0600  metroNIDAZOLE (FLAGYL) IVPB 500 mg     500 mg 100 mL/hr over 60 Minutes Intravenous Every 8 hours 07/14/18 0117     07/10/2018 2345  vancomycin (VANCOCIN) IVPB 750 mg/150 ml premix     750 mg 150 mL/hr over 60 Minutes Intravenous  Once 06/26/2018 2335 07/14/18 0234   06/26/2018 2000  ceFEPIme (MAXIPIME) 2 g in sodium chloride 0.9 % 100 mL IVPB     2 g 200 mL/hr over 30 Minutes Intravenous  Once 07/03/2018 1956 06/29/2018 2129   07/06/2018  2000  metroNIDAZOLE (FLAGYL) IVPB 500 mg     500 mg 100 mL/hr over 60 Minutes Intravenous  Once 07/12/2018 1956 07/07/2018 2223   07/17/2018 2000  vancomycin (VANCOCIN) IVPB 1000 mg/200 mL premix     1,000 mg 200 mL/hr over 60 Minutes Intravenous  Once  06/24/2018 1956 07/20/2018 2331      Assessment/Plan: s/p * No surgery found * cholecystitis. Continue perc drain and abx  Slow improvement. No plan for urgent surgery Continue to support and monitor in icu  LOS: 2 days    Autumn Messing III 07/15/2018

## 2018-07-15 NOTE — Progress Notes (Signed)
Initial Nutrition Assessment  RD working remotely.   DOCUMENTATION CODES:   Not applicable  INTERVENTION:  - diet advancement as medically feasible. - will order appropriate oral nutrition supplements with diet advancement.   NUTRITION DIAGNOSIS:   Increased nutrient needs related to acute illness(severe sepsis) as evidenced by estimated needs.  GOAL:   Patient will meet greater than or equal to 90% of their needs  MONITOR:   Diet advancement, Labs, Weight trends, Skin  REASON FOR ASSESSMENT:   Malnutrition Screening Tool  ASSESSMENT:   78 year old female with history of advanced dementia, type 2 DM, HTN, recurrent UTIs, dysphagia, and indwelling Foley catheter at baseline. She presented to the ED with complaints of AMS, abdominal pain, and vomiting. She was recently admitted 5/15-5/20 d/t severe sepsis 2/2 CAUTI and was treated with IV antibiotics. CT abdomen/pelvis done on presentation showed dilated gallbladder with gallstones and features highly suspicious for acute cholecystitis. General surgery consulted and IR consulted for percutaneous drain/cholecystostomy on 5/23. Also suspected to be in DKA on presentation and started on insulin drip.  Patient has been NPO since admission. Per RN flow sheet, patient is disoriented x4. Perc chole drain placed by Radiology yesterday AM. Per chart review, current weight is 180 lb and weight on 5/15 was 188 lb; of note, 5/15 was date of admission from previous hospitalization. Will continue to monitor weight trends closely.   Per notes: no plan for surgery at this time, severe sepsis 2/2 acute cholecystitis, metabolic encephalopathy for patient with dementia at baseline, anion gap metabolic acidosis on IV fluids, AKI, dysphagia with patient on dysphagia 1 diet last admission.    Medications reviewed; sliding scale novolog, 2 g IV Mg sulfate x1 run 5/23, 10 mEq IV KCl x6 runs 5/23 Labs reviewed; Cl: 119 mmol/l, BUN: 54 mg/dl, creatinine:  1.93 mg/dl, Ca: 8 mg/dl, GFR: 24 ml/min.  IVF; 150 mEq sodium bicarb in sterile water @ 100 ml/hr.     NUTRITION - FOCUSED PHYSICAL EXAM:  unable to complete at this time.   Diet Order:   Diet Order            Diet NPO time specified  Diet effective now              EDUCATION NEEDS:   No education needs have been identified at this time  Skin:  Skin Assessment: Skin Integrity Issues: Skin Integrity Issues:: DTI DTI: sacrum, bilateral heels, R foot  Last BM:  5/24  Height:   Ht Readings from Last 1 Encounters:  07/14/18 5\' 7"  (1.702 m)    Weight:   Wt Readings from Last 1 Encounters:  07/14/18 81.9 kg    Ideal Body Weight:  61.4 kg  BMI:  Body mass index is 28.28 kg/m.  Estimated Nutritional Needs:   Kcal:  2045-2295 kcal  Protein:  105-120 grams  Fluid:  >/= 2.2 L/day     Jarome Matin, MS, RD, LDN, [facility] Clinical Dietitian Pager # [phone] After hours/weekend pager # [phone]

## 2018-07-16 ENCOUNTER — Inpatient Hospital Stay (HOSPITAL_COMMUNITY): Payer: Medicare Other

## 2018-07-16 LAB — CBC WITH DIFFERENTIAL/PLATELET
Abs Immature Granulocytes: 0.05 10*3/uL (ref 0.00–0.07)
Basophils Absolute: 0 10*3/uL (ref 0.0–0.1)
Basophils Relative: 0 %
Eosinophils Absolute: 0 10*3/uL (ref 0.0–0.5)
Eosinophils Relative: 0 %
HCT: 28.4 % — ABNORMAL LOW (ref 36.0–46.0)
Hemoglobin: 8.1 g/dL — ABNORMAL LOW (ref 12.0–15.0)
Immature Granulocytes: 0 %
Lymphocytes Relative: 9 %
Lymphs Abs: 1 10*3/uL (ref 0.7–4.0)
MCH: 25.3 pg — ABNORMAL LOW (ref 26.0–34.0)
MCHC: 28.5 g/dL — ABNORMAL LOW (ref 30.0–36.0)
MCV: 88.8 fL (ref 80.0–100.0)
Monocytes Absolute: 0.5 10*3/uL (ref 0.1–1.0)
Monocytes Relative: 5 %
Neutro Abs: 9.8 10*3/uL — ABNORMAL HIGH (ref 1.7–7.7)
Neutrophils Relative %: 86 %
Platelets: 170 10*3/uL (ref 150–400)
RBC: 3.2 MIL/uL — ABNORMAL LOW (ref 3.87–5.11)
RDW: 17.4 % — ABNORMAL HIGH (ref 11.5–15.5)
WBC: 11.5 10*3/uL — ABNORMAL HIGH (ref 4.0–10.5)
nRBC: 0 % (ref 0.0–0.2)

## 2018-07-16 LAB — BASIC METABOLIC PANEL
Anion gap: 11 (ref 5–15)
BUN: 57 mg/dL — ABNORMAL HIGH (ref 8–23)
CO2: 20 mmol/L — ABNORMAL LOW (ref 22–32)
Calcium: 7.9 mg/dL — ABNORMAL LOW (ref 8.9–10.3)
Chloride: 116 mmol/L — ABNORMAL HIGH (ref 98–111)
Creatinine, Ser: 1.72 mg/dL — ABNORMAL HIGH (ref 0.44–1.00)
GFR calc Af Amer: 32 mL/min — ABNORMAL LOW (ref >60)
GFR calc non Af Amer: 28 mL/min — ABNORMAL LOW (ref >60)
Glucose, Bld: 155 mg/dL — ABNORMAL HIGH (ref 70–99)
Potassium: 2.4 mmol/L — CL (ref 3.5–5.1)
Sodium: 147 mmol/L — ABNORMAL HIGH (ref 135–145)

## 2018-07-16 LAB — MAGNESIUM: Magnesium: 2.1 mg/dL (ref 1.7–2.4)

## 2018-07-16 LAB — GLUCOSE, CAPILLARY
Glucose-Capillary: 176 mg/dL — ABNORMAL HIGH (ref 70–99)
Glucose-Capillary: 180 mg/dL — ABNORMAL HIGH (ref 70–99)

## 2018-07-16 LAB — POTASSIUM: Potassium: 2.6 mmol/L — CL (ref 3.5–5.1)

## 2018-07-16 MED ORDER — POTASSIUM CHLORIDE 10 MEQ/100ML IV SOLN
10.0000 meq | INTRAVENOUS | Status: AC
  Administered 2018-07-16 (×3): 10 meq via INTRAVENOUS
  Filled 2018-07-16 (×4): qty 100

## 2018-07-16 MED ORDER — SODIUM CHLORIDE 0.45 % IV SOLN
INTRAVENOUS | Status: DC
  Administered 2018-07-16: 09:00:00 via INTRAVENOUS

## 2018-07-16 MED ORDER — POTASSIUM CHLORIDE 10 MEQ/100ML IV SOLN
10.0000 meq | INTRAVENOUS | Status: DC
  Administered 2018-07-16: 10 meq via INTRAVENOUS
  Filled 2018-07-16 (×6): qty 100

## 2018-07-19 LAB — AEROBIC/ANAEROBIC CULTURE W GRAM STAIN (SURGICAL/DEEP WOUND)
Culture: NO GROWTH
Gram Stain: NONE SEEN

## 2018-07-19 LAB — CULTURE, BLOOD (ROUTINE X 2)
Culture: NO GROWTH
Culture: NO GROWTH
Special Requests: ADEQUATE
Special Requests: ADEQUATE

## 2018-07-23 NOTE — Evaluation (Signed)
SLP Cancellation Note  Patient Details Name: [name] MRN: [id number] DOB: May 03, 1940   Cancelled treatment:       Reason Eval/Treat Not Completed: Medical issues which prohibited therapy;Other (comment)(pt with possible aspiration event, currently lethargic and on facemask oxygen, will follow up next date)   Macario Golds 08-14-18, 2:53 PM   Luanna Salk, [place] [facility] SLP [place] Pager [phone] Office [phone]

## 2018-07-23 NOTE — Progress Notes (Signed)
PROGRESS NOTE    [name]  GGE:[id number] DOB: 1941/02/12 DOA: 06/25/2018 PCP: [name], MD   Brief Narrative: Patient is a46 year old female with history of advanced dementia, diabetes mellitus type 2, hypertension, recurrent UTIs, dysphagia, indwelling Foley catheter at baseline who was brought to the emergency department with complaints of altered mental status.  She was complaining of abdomen pain at home, was vomiting.  She was recently admitted on 07/06/2018 and was discharged on 07/14/2018 after being treated for severe sepsis secondary to catheter associated urinary tract infection and was treated with IV antibiotics. CT abdomen/pelvis done on presentation showed dilated gallbladder with gallstones and features highly suspicious for acute cholecystitis.  General surgery consulted.  IR did percutaneous drain/cholecystostomy on 07/14/18.  Started on antibiotics.  Also suspected to be in DKA on presentation and started on insulin drip.   This morning she is more stable hemodynamically.  Diarrhoea stopped.  Continues to remain weak and lethargic.  Assessment & Plan:   Active Problems:   Sepsis (Bone Gap)  Suspected Severe sepsis:Secondary to acute cholecystitis.Presents with fever, hypotension, tachycardia, leukocytosis, lactic acidosis.  Continue broad-spectrum antibiotics.  Follow-up cultures.  Continue gentle IV fluids  Acute cholecystitis: CT scan showed dilated gallbladder with gallstones, considerable pericholecystitic inflammatory changes consistent with acute cholecystitis . General surgery consulted.  IR did percutaneous drain/cholecystostomy .  Continue IV fluids, antibiotics.  Speech therapy consulted for swallow evaluation.   Metabolic encephalopathy: From sepsis .Patient also has underlying dementia.  Mental status has improved since admission but remains nonverbal, weak  Anion gap metabolic acidosis: Resolved.Likely from lactic acidosis but glucose was also elevated  at 542. DKA  suspected.  Started on insulin drip. Gap closed  .Drip stopped.  Diarrhea: Slowed down.  C. difficile/GI pathogen negative.  Acute kidney injury/non-anion gap metabolic acidosis:Patient's baseline creatinine 0.7, she presented with AKI.  Low bicarb.  Started on bicarb drip.  Bicarb drip was stopped.  Started on half-normal saline.  Dysphagia:On last admission,speech therapy was consulted for dysphagia, patient started on dysphagia 1 diet.  Speech therapy reconsulted today.  Hypertension: BP soft  at present.  Hold antihypertensives  Diabetes mellitus type 2: On metformin at home.  Continue sliding scale insulin.   Debility/deconditioning/dementia/poor prognosis with multiple comorbidities/advanced age: Palliative care consulted   Nutrition Problem: Increased nutrient needs Etiology: acute illness(severe sepsis)      DVT prophylaxis:ScD Code Status: DNR Family Communication: Discussed with the daughter on phone .Will try again today Disposition Plan: Undetermined at this point   Consultants: General surgery, IR  Procedures: Cholecystostomy  Antimicrobials:  Anti-infectives (From admission, onward)   Start     Dose/Rate Route Frequency Ordered Stop   07/15/18 2200  vancomycin (VANCOCIN) 1,500 mg in sodium chloride 0.9 % 500 mL IVPB  Status:  Discontinued     1,500 mg 250 mL/hr over 120 Minutes Intravenous Every 48 hours 07/14/18 0506 07/15/18 1349   07/14/18 2000  ceFEPIme (MAXIPIME) 2 g in sodium chloride 0.9 % 100 mL IVPB     2 g 200 mL/hr over 30 Minutes Intravenous Every 24 hours 07/14/18 0506     07/14/18 0600  metroNIDAZOLE (FLAGYL) IVPB 500 mg     500 mg 100 mL/hr over 60 Minutes Intravenous Every 8 hours 07/14/18 0117     07/15/2018 2345  vancomycin (VANCOCIN) IVPB 750 mg/150 ml premix     750 mg 150 mL/hr over 60 Minutes Intravenous  Once 07/08/2018 2335 07/14/18 0234   06/23/2018 2000  ceFEPIme (MAXIPIME) 2  g in sodium chloride 0.9 % 100 mL IVPB     2  g 200 mL/hr over 30 Minutes Intravenous  Once 06/28/2018 1956 07/19/2018 2129   06/24/2018 2000  metroNIDAZOLE (FLAGYL) IVPB 500 mg     500 mg 100 mL/hr over 60 Minutes Intravenous  Once 07/04/2018 1956 07/15/2018 2223   06/23/2018 2000  vancomycin (VANCOCIN) IVPB 1000 mg/200 mL premix     1,000 mg 200 mL/hr over 60 Minutes Intravenous  Once 06/22/2018 1956 07/05/2018 2331      Subjective: Patient seen and examined the bedside this morning.  This morning she is hemodynamically stable.  Afebrile.  Still remains very weak, lethargic.  Hardly arousable but opens her eyes when calling her name or shaking her body  Objective: Vitals:   2018/07/17 0600 Jul 17, 2018 0700 17-Jul-2018 0800 2018/07/17 0900  BP: (!) 129/48 (!) 127/53 (!) 119/59 129/64  Pulse: 75 82 82 78  Resp: (!) 21 18 19 18   Temp:   98.9 F (37.2 C)   TempSrc:   Oral   SpO2: 98% 98% 98% 97%  Weight:      Height:        Intake/Output Summary (Last 24 hours) at 17-Jul-2018 1020 Last data filed at 07-17-18 0600 Gross per 24 hour  Intake 2321.69 ml  Output 710 ml  Net 1611.69 ml   Filed Weights   07/14/18 0119  Weight: 81.9 kg    Examination:   General exam: chronically ill looking, lethargic, weak  HEENT:PERRL,Oral mucosa moist, Ear/Nose normal on gross exam Respiratory system: Bilateral equal air entry, normal vesicular breath sounds, no wheezes or crackles  Cardiovascular system: S1 & S2 heard, RRR. No JVD, murmurs, rubs, gallops or clicks. Gastrointestinal system: Abdomen is nondistended, soft and nontender. No organomegaly or masses felt. Normal bowel sounds heard.RUQ drain Central nervous system:Not  Alert and oriented.  Extremities: trace edema, no clubbing ,no cyanosis, distal peripheral pulses palpable.  Data Reviewed: I have personally reviewed following labs and imaging studies  CBC: Recent Labs  Lab 07/10/18 0859 07/02/2018 2012 07/14/18 0538 07/15/18 0313 07-17-18 0339  WBC 8.3 26.9* 21.6* 18.8* 11.5*  NEUTROABS  --   24.7*  --  16.3* 9.8*  HGB 10.2* 12.5 10.6* 9.6* 8.1*  HCT 35.2* 39.8 35.6* 33.7* 28.4*  MCV 88.4 83.4 86.0 91.3 88.8  PLT 283 405* 307 226 607   Basic Metabolic Panel: Recent Labs  Lab 07/14/18 0538 07/14/18 0829 07/14/18 1256 07/14/18 1500 07/14/18 1506 07/15/18 0313 Jul 17, 2018 0339  NA 146* 135 145  --   --  145 147*  K 2.7* 14.3* 5.1  --  4.7 3.5 2.4*  CL 111 113* 113*  --   --  119* 116*  CO2 19* 20* 23  --   --  16* 20*  GLUCOSE 396* 178* 119*  --   --  248* 155*  BUN 43* 42* 49*  --   --  54* 57*  CREATININE 1.82* 1.61* 1.69*  --   --  1.93* 1.72*  CALCIUM 8.5* 7.4* 8.2*  --   --  8.0* 7.9*  MG  --   --   --  1.5*  --   --   --    GFR: Estimated Creatinine Clearance: 29.2 mL/min (A) (by C-G formula based on SCr of 1.72 mg/dL (H)). Liver Function Tests: Recent Labs  Lab 07/10/18 0859 07/10/2018 2012 07/14/18 0538 07/15/18 0313  AST 16 17 23 29   ALT 18 18 20 26   ALKPHOS  60 86 67 60  BILITOT 0.7 2.0* 0.8 1.1  PROT 6.3* 7.3 6.3* 6.0*  ALBUMIN 3.0* 3.3* 2.7* 2.5*   No results for input(s): LIPASE, AMYLASE in the last 168 hours. No results for input(s): AMMONIA in the last 168 hours. Coagulation Profile: Recent Labs  Lab 07/14/18 0829  INR 1.5*   Cardiac Enzymes: No results for input(s): CKTOTAL, CKMB, CKMBINDEX, TROPONINI in the last 168 hours. BNP (last 3 results) No results for input(s): PROBNP in the last 8760 hours. HbA1C: No results for input(s): HGBA1C in the last 72 hours. CBG: Recent Labs  Lab 07/15/18 0822 07/15/18 1302 07/15/18 1705 07/15/18 2144 Aug 10, 2018 0802  GLUCAP 244* 205* 138* 133* 176*   Lipid Profile: No results for input(s): CHOL, HDL, LDLCALC, TRIG, CHOLHDL, LDLDIRECT in the last 72 hours. Thyroid Function Tests: No results for input(s): TSH, T4TOTAL, FREET4, T3FREE, THYROIDAB in the last 72 hours. Anemia Panel: No results for input(s): VITAMINB12, FOLATE, FERRITIN, TIBC, IRON, RETICCTPCT in the last 72 hours. Sepsis Labs:  Recent Labs  Lab 06/26/2018 2012 07/14/2018 2333 07/15/18 0313  LATICACIDVEN 4.0* 1.9 1.8    Recent Results (from the past 240 hour(s))  Blood culture (routine x 2)     Status: None   Collection Time: 07/06/18  6:06 PM  Result Value Ref Range Status   Specimen Description BLOOD RIGHT HAND  Final   Special Requests   Final    BOTTLES DRAWN AEROBIC AND ANAEROBIC Blood Culture results may not be optimal due to an inadequate volume of blood received in culture bottles   Culture   Final    NO GROWTH 5 DAYS Performed at [facility], [place] [address] [place], [place] [address]    Report Status 07/20/2018 FINAL  Final  Blood culture (routine x 2)     Status: Abnormal   Collection Time: 07/06/18  6:13 PM  Result Value Ref Range Status   Specimen Description BLOOD LEFT HAND  Final   Special Requests   Final    BOTTLES DRAWN AEROBIC AND ANAEROBIC Blood Culture adequate volume   Culture  Setup Time   Final    ANAEROBIC BOTTLE ONLY GRAM POSITIVE COCCI IN PAIRS RESULT CALLED TO, READ BACK BY AND VERIFIED WITH: BETH GREENE @ 3300 ON 07/08/18 BY ROBINSON Z.  AEROBIC BOTTLE ONLY GRAM POSITIVE RODS CRITICAL RESULT CALLED TO, READ BACK BY AND VERIFIED WITH: J GRIMSLEY PHARMD 07/10/18 2340 JDW    Culture (A)  Final    STAPHYLOCOCCUS SPECIES (COAGULASE NEGATIVE) THE SIGNIFICANCE OF ISOLATING THIS ORGANISM FROM A SINGLE SET OF BLOOD CULTURES WHEN MULTIPLE SETS ARE DRAWN IS UNCERTAIN. PLEASE NOTIFY THE MICROBIOLOGY DEPARTMENT WITHIN ONE WEEK IF SPECIATION AND SENSITIVITIES ARE REQUIRED. DIPHTHEROIDS(CORYNEBACTERIUM SPECIES) Standardized susceptibility testing for this organism is not available. Performed at [facility], [place] [address] [place], [place] [address]    Report Status 07/14/2018 FINAL  Final  Blood Culture ID Panel (Reflexed)     Status: Abnormal   Collection Time: 07/06/18  6:13 PM  Result Value Ref Range Status   Enterococcus species NOT DETECTED NOT DETECTED Final    Listeria monocytogenes NOT DETECTED NOT DETECTED Final   Staphylococcus species DETECTED (A) NOT DETECTED Final    Comment: Methicillin (oxacillin) resistant coagulase negative staphylococcus. Possible blood culture contaminant (unless isolated from more than one blood culture draw or clinical case suggests pathogenicity). No antibiotic treatment is indicated for blood  culture contaminants. CRITICAL RESULT CALLED TO, READ BACK BY AND  VERIFIED WITH: BETH GREENE @ 0056 ON 07/08/18 BY ROBINSON Z.     Staphylococcus aureus (BCID) NOT DETECTED NOT DETECTED Final   Methicillin resistance DETECTED (A) NOT DETECTED Final    Comment: CRITICAL RESULT CALLED TO, READ BACK BY AND VERIFIED WITH: BETH GREENE @ 0056 ON 07/08/18 BY ROBINSON Z.     Streptococcus species NOT DETECTED NOT DETECTED Final   Streptococcus agalactiae NOT DETECTED NOT DETECTED Final   Streptococcus pneumoniae NOT DETECTED NOT DETECTED Final   Streptococcus pyogenes NOT DETECTED NOT DETECTED Final   Acinetobacter baumannii NOT DETECTED NOT DETECTED Final   Enterobacteriaceae species NOT DETECTED NOT DETECTED Final   Enterobacter cloacae complex NOT DETECTED NOT DETECTED Final   Escherichia coli NOT DETECTED NOT DETECTED Final   Klebsiella oxytoca NOT DETECTED NOT DETECTED Final   Klebsiella pneumoniae NOT DETECTED NOT DETECTED Final   Proteus species NOT DETECTED NOT DETECTED Final   Serratia marcescens NOT DETECTED NOT DETECTED Final   Haemophilus influenzae NOT DETECTED NOT DETECTED Final   Neisseria meningitidis NOT DETECTED NOT DETECTED Final   Pseudomonas aeruginosa NOT DETECTED NOT DETECTED Final   Candida albicans NOT DETECTED NOT DETECTED Final   Candida glabrata NOT DETECTED NOT DETECTED Final   Candida krusei NOT DETECTED NOT DETECTED Final   Candida parapsilosis NOT DETECTED NOT DETECTED Final   Candida tropicalis NOT DETECTED NOT DETECTED Final    Comment: Performed at [facility], [place] [address],  [place], [place] [address]  Urine culture     Status: Abnormal   Collection Time: 07/06/18  7:43 PM  Result Value Ref Range Status   Specimen Description   Final    URINE, CLEAN CATCH Performed at [facility], [place] [address], [place], [place] [address]    Special Requests   Final    NONE Performed at [facility], [place] [address] [place], [place] [address]    Culture (A)  Final    >=100,000 COLONIES/mL MULTIPLE SPECIES PRESENT, SUGGEST RECOLLECTION   Report Status 07/08/2018 FINAL  Final  SARS Coronavirus 2 (CEPHEID - Performed in [place] hospital lab), Hosp Order     Status: None   Collection Time: 07/06/18  8:11 PM  Result Value Ref Range Status   SARS Coronavirus 2 NEGATIVE NEGATIVE Final    Comment: (NOTE) If result is NEGATIVE SARS-CoV-2 target nucleic acids are NOT DETECTED. The SARS-CoV-2 RNA is generally detectable in upper and lower  respiratory specimens during the acute phase of infection. The lowest  concentration of SARS-CoV-2 viral copies this assay can detect is 250  copies / mL. A negative result does not preclude SARS-CoV-2 infection  and should not be used as the sole basis for treatment or other  patient management decisions.  A negative result may occur with  improper specimen collection / handling, submission of specimen other  than nasopharyngeal swab, presence of viral mutation(s) within the  areas targeted by this assay, and inadequate number of viral copies  (<250 copies / mL). A negative result must be combined with clinical  observations, patient history, and epidemiological information. If result is POSITIVE SARS-CoV-2 target nucleic acids are DETECTED. The SARS-CoV-2 RNA is generally detectable in upper and lower  respiratory specimens dur ing the acute phase of infection.  Positive  results are indicative of active infection with SARS-CoV-2.  Clinical  correlation with patient history and other diagnostic  information is  necessary to determine patient infection status.  Positive results do  not rule out bacterial infection or co-infection with other viruses. If result is PRESUMPTIVE POSTIVE SARS-CoV-2 nucleic acids MAY BE PRESENT.   A presumptive positive result was obtained on the submitted specimen  and confirmed on repeat testing.  While 2019 novel coronavirus  (SARS-CoV-2) nucleic acids may be present in the submitted sample  additional confirmatory testing may be necessary for epidemiological  and / or clinical management purposes  to differentiate between  SARS-CoV-2 and other Sarbecovirus currently known to infect humans.  If clinically indicated additional testing with an alternate test  methodology [phone]) is advised. The SARS-CoV-2 RNA is generally  detectable in upper and lower respiratory sp ecimens during the acute  phase of infection. The expected result is Negative. Fact Sheet for Patients:  StrictlyIdeas.no Fact Sheet for Healthcare Providers: BankingDealers.co.za This test is not yet approved or cleared by the [place] FDA and has been authorized for detection and/or diagnosis of SARS-CoV-2 by FDA under an Emergency Use Authorization (EUA).  This EUA will remain in effect (meaning this test can be used) for the duration of the COVID-19 declaration under Section 564(b)(1) of the Act, 21 U.S.C. section 360bbb-3(b)(1), unless the authorization is terminated or revoked sooner. Performed at [facility], [place] [address] [place], [place] [address]   Blood Culture (routine x 2)     Status: None (Preliminary result)   Collection Time: 07/09/2018  8:12 PM  Result Value Ref Range Status   Specimen Description   Final    BLOOD LEFT HAND Performed at [place] [address], [place], [place] [address]    Special Requests   Final    BOTTLES DRAWN AEROBIC AND ANAEROBIC Blood Culture  adequate volume Performed at [place] [address] [place], [place] [address]    Culture   Final    NO GROWTH 2 DAYS Performed at [place] [address], [place], [place] [address]    Report Status PENDING  Incomplete  Urine culture     Status: None   Collection Time: 06/29/2018  8:12 PM  Result Value Ref Range Status   Specimen Description   Final    URINE, CATHETERIZED Performed at [place] [address], [place], [place] [address]    Special Requests   Final    NONE Performed at [facility], [place] [address] [place], [place] [address]    Culture   Final    NO GROWTH Performed at [facility], [place] [address] [place], [place] [address]    Report Status 07/15/2018 FINAL  Final  Blood Culture (routine x 2)     Status: None (Preliminary result)   Collection Time: 07/09/2018  9:11 PM  Result Value Ref Range Status   Specimen Description   Final    BLOOD LEFT HAND Performed at [place] [address] [place], [place] [address]    Special Requests   Final    BOTTLES DRAWN AEROBIC AND ANAEROBIC Blood Culture adequate volume Performed at [place] [address], [place], [place] [address]    Culture   Final    NO GROWTH 2 DAYS Performed at [place] [address] [address], [place], [place] [address]    Report Status PENDING  Incomplete  SARS Coronavirus 2 (CEPHEID- Performed in [place] hospital lab), Hosp Order     Status: None   Collection Time: 07/02/2018 11:33 PM  Result Value Ref Range Status   SARS Coronavirus 2 NEGATIVE NEGATIVE Final    Comment: (NOTE) If result is NEGATIVE SARS-CoV-2 target nucleic acids are NOT DETECTED. The SARS-CoV-2 RNA is generally detectable in upper and lower  respiratory specimens during the acute phase of infection. The lowest  concentration of SARS-CoV-2 viral copies this assay can detect is 250  copies  / mL. A negative result does not preclude SARS-CoV-2 infection  and should not be used as the sole basis for treatment or other  patient management decisions.  A negative result may occur with  improper specimen collection / handling, submission of specimen other  than nasopharyngeal swab, presence of viral mutation(s) within the  areas targeted by this assay, and inadequate number of viral copies  (<250 copies / mL). A negative result must be combined with clinical  observations, patient history, and epidemiological information. If result is POSITIVE SARS-CoV-2 target nucleic acids are DETECTED. The SARS-CoV-2 RNA is generally detectable in upper and lower  respiratory specimens dur ing the acute phase of infection.  Positive  results are indicative of active infection with SARS-CoV-2.  Clinical  correlation with patient history and other diagnostic information is  necessary to determine patient infection status.  Positive results do  not rule out bacterial infection or co-infection with other viruses. If result is PRESUMPTIVE POSTIVE SARS-CoV-2 nucleic acids MAY BE PRESENT.   A presumptive positive result was obtained on the submitted specimen  and confirmed on repeat testing.  While 2019 novel coronavirus  (SARS-CoV-2) nucleic acids may be present in the submitted sample  additional confirmatory testing may be necessary for epidemiological  and / or clinical management purposes  to differentiate between  SARS-CoV-2 and other Sarbecovirus currently known to infect humans.  If clinically indicated additional testing with an alternate test  methodology [phone]) is advised. The SARS-CoV-2 RNA is generally  detectable in upper and lower respiratory sp ecimens during the acute  phase of infection. The expected result is Negative. Fact Sheet for Patients:  StrictlyIdeas.no Fact Sheet for Healthcare Providers: BankingDealers.co.za This test  is not yet approved or cleared by the [place] FDA and has been authorized for detection and/or diagnosis of SARS-CoV-2 by FDA under an Emergency Use Authorization (EUA).  This EUA will remain in effect (meaning this test can be used) for the duration of the COVID-19 declaration under Section 564(b)(1) of the Act, 21 U.S.C. section 360bbb-3(b)(1), unless the authorization is terminated or revoked sooner. Performed at [facility], [place] [address], [place], [place] [address]   MRSA PCR Screening     Status: None   Collection Time: 07/14/18  3:25 AM  Result Value Ref Range Status   MRSA by PCR NEGATIVE NEGATIVE Final    Comment:        The GeneXpert MRSA Assay (FDA approved for NASAL specimens only), is one component of a comprehensive MRSA colonization surveillance program. It is not intended to diagnose MRSA infection nor to guide or monitor treatment for MRSA infections. Performed at [facility], [place] [address] [place], [place] [address]   Aerobic/Anaerobic Culture (surgical/deep wound)     Status: None (Preliminary result)   Collection Time: 07/14/18 10:14 AM  Result Value Ref Range Status   Specimen Description   Final    WOUND Performed at [place] [address] [place], [place] [address]    Special Requests   Final    NONE Performed at [facility], [place] [address] [place], [place] [address]    Gram Stain NO WBC SEEN NO ORGANISMS SEEN   Final   Culture   Final    NO GROWTH < 24 HOURS Performed at [facility], [place] [address] [place], [place] [address]    Report Status PENDING  Incomplete  Gastrointestinal Panel by PCR , Stool     Status: None   Collection Time: 07/15/18  8:30 AM  Result Value Ref Range Status   Campylobacter species NOT DETECTED NOT DETECTED Final   Plesimonas shigelloides NOT DETECTED NOT DETECTED Final   Salmonella species NOT DETECTED NOT DETECTED  Final   Yersinia enterocolitica NOT DETECTED NOT DETECTED Final   Vibrio species NOT DETECTED NOT DETECTED Final   Vibrio cholerae NOT DETECTED NOT DETECTED Final   Enteroaggregative E coli (EAEC) NOT DETECTED NOT DETECTED Final   Enteropathogenic E coli (EPEC) NOT DETECTED NOT DETECTED Final   Enterotoxigenic E coli (ETEC) NOT DETECTED NOT DETECTED Final   Shiga like toxin producing E coli (STEC) NOT DETECTED NOT DETECTED Final   Shigella/Enteroinvasive E coli (EIEC) NOT DETECTED NOT DETECTED Final   Cryptosporidium NOT DETECTED NOT DETECTED Final   Cyclospora cayetanensis NOT DETECTED NOT DETECTED Final   Entamoeba histolytica NOT DETECTED NOT DETECTED Final   Giardia lamblia NOT DETECTED NOT DETECTED Final   Adenovirus F40/41 NOT DETECTED NOT DETECTED Final   Astrovirus NOT DETECTED NOT DETECTED Final   Norovirus GI/GII NOT DETECTED NOT DETECTED Final   Rotavirus A NOT DETECTED NOT DETECTED Final   Sapovirus (I, II, IV, and V) NOT DETECTED NOT DETECTED Final    Comment: Performed at [facility], [place] [place], [place] [address]  C difficile quick scan w PCR reflex     Status: None   Collection Time: 07/15/18  8:30 AM  Result Value Ref Range Status   C Diff antigen NEGATIVE NEGATIVE Final   C Diff toxin NEGATIVE NEGATIVE Final   C Diff interpretation No C. difficile detected.  Final    Comment: Performed at [facility], [place] [address], [place], [place] [address]         Radiology Studies: Ir Perc Cholecystostomy  Result Date: 07/14/2018 CLINICAL DATA:  Acute cholecystitis, sepsis, dementia EXAM: PERCUTANEOUS CHOLECYSTOSTOMY TUBE PLACEMENT WITH ULTRASOUND AND FLUOROSCOPIC GUIDANCE FLUOROSCOPY TIME:  0.7 minutes; 116 uGym2 DAP TECHNIQUE: The procedure, risks (including but not limited to bleeding, infection, organ damage ), benefits, and alternatives were explained to the daughter over the telephone. Questions regarding the procedure were  encouraged and answered. The daughter understands and consents to the procedure. Survey ultrasound of the abdomen was performed and an appropriate skin entry site was identified. Skin site was marked, prepped with chlorhexidine, and draped in usual sterile fashion, and infiltrated locally with 1% lidocaine. Intravenous Fentanyl 78mcg and Versed 0.5mg  were administered as conscious sedation during continuous monitoring of the patient's level of consciousness and physiological / cardiorespiratory status by the radiology RN, with a total moderate sedation time of 11 minutes. Under real-time ultrasound guidance, gallbladder was accessed using a transhepatic approach with a 21-gauge needle. Ultrasound image documentation was saved. Bile returned through the hub. Needle was exchanged over a 018 guidewire for transitional dilator which allowed placement of 035 J wire. Over this, a 10.2 French pigtail catheter was advanced and formed centrally in the gallbladder lumen. 20 mL of dark green bilious aspirate sent for Gram stain and culture. Small contrast injection confirmed appropriate  position. Catheter secured externally with 0 Prolene suture and placed external drain bag. Patient tolerated the procedure well. COMPLICATIONS: COMPLICATIONS none IMPRESSION: 1. Technically successful percutaneous cholecystostomy tube placement with ultrasound and fluoroscopic guidance. Electronically Signed   By: Lucrezia Europe M.D.   On: 07/14/2018 11:26        Scheduled Meds: . Chlorhexidine Gluconate Cloth  6 each Topical Daily  . insulin aspart  0-5 Units Subcutaneous QHS  . insulin aspart  0-9 Units Subcutaneous TID WC  . mouth rinse  15 mL Mouth Rinse BID  . sodium chloride flush  5 mL Intracatheter Q8H   Continuous Infusions: . sodium chloride 75 mL/hr at 08-07-18 0915  . ceFEPime (MAXIPIME) IV Stopped (07/15/18 2123)  . metronidazole 500 mg (August 07, 2018 0533)  . sodium chloride Stopped (07/15/18 0031)     LOS: 3 days     Time spent: 35 mins.More than 50% of that time was spent in counseling and/or coordination of care.      Shelly Coss, MD [facility] Pager [phone]  If 7PM-7AM, please contact night-coverage [email or web address] Password [email or web address] 08/07/18, 10:20 AM

## 2018-07-23 NOTE — Progress Notes (Signed)
Patients heart rate noted to be in SVT at a rate of 240's, nursing staff responded to bedside to find patient vomiting.  Patient suctioned and sat up right. Despite interventions patient seemingly aspirated, chest and upper airway sounds wet and gurgling audible. MD paged. Chest Xray, NT suctioning, ordered and MD to call family. NT suctioning done with little improvement, patient's oxygen down to 79% and non-re breather face mask placed. Patient's code status confirmed with family and family invited to come as patient continued to decline. Chaplain paged to support patient as well as her family.

## 2018-07-23 NOTE — Death Summary Note (Deleted)
Physician Discharge Summary  [name] JIR:[id number] DOB: [birth year] DOA: 07/06/2018  PCP: [name], MD  Admit date: 07/06/2018 Discharge date: 07/12/2018  Admitted From: Home Disposition:  Home  Discharge Condition:Stable CODE STATUS:DNR Diet recommendation: Dysphagia 1,thin liquid  Brief/Interim Summary: Patient is a 78 year old female with history of advanced dementia, diabetes mellitus type 2, hypertension, recurrent UTIs, dysphagia, indwelling Foley catheter at baseline, who presented with worsening confusion and foul-smelling urine.  Patient was admitted for the management of urinary tract infection.  Her urine culture grew multiple organisms.  She was treated with 5 days of IV ceftriaxone.  Currently she is hemodynamically stable, afebrile.  Mental status is back to baseline.  She is hemodynamically stable for discharge to home today.  Following problems were addressed during her hospitalization:   1. Severe sepsis-sepsis physiology has resolved.  Secondary to CAUTI, Treated with antibiotics.  2. CAUTI-patient has chronic Foley catheter, urine culture grew multiple species.  Treated with 5 days course of  Ceftriaxone.  3. Coagulase-negative staph bacteremia-blood cultures x1 bottle, anaerobic growing coagulase-negative staph, methicillin-resistant.  Likely contaminant.  Would not treat at this time.   4. Metabolic encephalopathy-patient has underlying dementia, likely could have worsened from above.  Appears to be back to baseline.  5. Acute kidney injury-patient baseline creatinine 0.7, she presented with creatinine of 1.34 on admission.    Kidney function on baseline now.  6. Hypernatremia-Resolved .  7. Dysphagia-speech therapy was consulted for dysphagia, patient started on dysphagia 1 diet.  8. Hypertension-blood pressures controlled, amlodipine and losartan are on hold.    Continue to monitor blood pressure at home.  Restart these medications if  needed.  9. Diabetes mellitus type 2-resume home meds.    10. Hypokalemia-Supplemented and corrected.    Discharge Diagnoses:  Principal Problem:   Urinary tract infection associated with catheterization of urinary tract, initial encounter [facility]) Active Problems:   Essential hypertension   Sepsis ([place])   Acute kidney injury ([place])   Dementia ([place])   Dysphagia   DM type 2 (diabetes mellitus, type 2) (HCC)   Pressure injury of skin   Altered mental status    Discharge Instructions  Discharge Instructions    Diet general   Complete by:  As directed    Dysphagia 1,thin liquid   Discharge instructions   Complete by:  As directed    1)Please follow up with your PCP in a week.  Do a CBC, BMP test during the follow-up 2)Your blood pressure is currently stable without any medications.  We have held amlodipine and losartan.  Follow-up with your PCP.  Monitor your blood pressure at home.  If needed you can restart this medications.   Increase activity slowly   Complete by:  As directed      Allergies as of 07/10/2018      Reactions   Flounder [fish Allergy] Other (See Comments)   Pt breaks out in bumps   Morphine And Related Other (See Comments)   Reaction:  Hallucinations    Oxycodone Other (See Comments)   Reaction:  Hallucinations   Sulfonamide Derivatives Swelling, Other (See Comments)   Reaction:  Unspecified swelling reaction       Medication List    STOP taking these medications   amLODipine 10 MG tablet Commonly known as:  NORVASC   losartan 25 MG tablet Commonly known as:  COZAAR     TAKE these medications   acetaminophen 500 MG tablet Commonly known as:  TYLENOL Take 500 mg  by mouth every 8 (eight) hours as needed for mild pain.   ALPRAZolam 0.25 MG tablet Commonly known as:  XANAX Take 1 tablet (0.25 mg total) by mouth at bedtime as needed for anxiety.   aspirin 325 MG EC tablet Take 325 mg by mouth daily.   Dermacloud Crea Apply 1 application  topically as needed (each change).   loratadine-pseudoephedrine 5-120 MG tablet Commonly known as:  CLARITIN-D 12-hour Take 1 tablet by mouth 2 (two) times daily as needed for allergies.   Magnesium Oxide 500 MG Tabs Take 500 mg by mouth daily.   metFORMIN 500 MG tablet Commonly known as:  GLUCOPHAGE Take 500-1,000 mg by mouth See admin instructions. Take 1,000mg  by mouth in morning, 500mg  by mouth at lunch, and 500mg  by mouth in evening   omeprazole 40 MG capsule Commonly known as:  PRILOSEC Take 40 mg by mouth daily.   ondansetron 4 MG tablet Commonly known as:  ZOFRAN Take 4 mg by mouth every 6 (six) hours as needed for nausea/vomiting.   pramipexole 1.5 MG tablet Commonly known as:  MIRAPEX Take 1.5 mg by mouth 3 (three) times daily.      Follow-up Information    [name], MD. Schedule an appointment as soon as possible for a visit in 1 week(s).   Specialty:  Internal Medicine Contact information: [place] [address] [phone]          Allergies  Allergen Reactions  . Flounder [Fish Allergy] Other (See Comments)    Pt breaks out in bumps  . Morphine And Related Other (See Comments)    Reaction:  Hallucinations   . Oxycodone Other (See Comments)    Reaction:  Hallucinations  . Sulfonamide Derivatives Swelling and Other (See Comments)    Reaction:  Unspecified swelling reaction     Consultations:  None   Procedures/Studies: Dg Chest Port 1 View  Result Date: 07/06/2018 CLINICAL DATA:  Fever. EXAM: PORTABLE CHEST 1 VIEW COMPARISON:  Radiograph 06/12/2018 FINDINGS: Low lung volumes.The cardiomediastinal contours are unchanged with mild aortic tortuosity. Pulmonary vasculature is normal. No consolidation, pleural effusion, or pneumothorax. No acute osseous abnormalities are seen. IMPRESSION: Low lung volumes without acute abnormality. Electronically Signed   By: Keith Rake M.D.   On: 07/06/2018 19:51   Dg Chest Port 1  View  Result Date: 06/12/2018 CLINICAL DATA:  Cardiac arrhythmia EXAM: PORTABLE CHEST 1 VIEW COMPARISON:  May 18, 2018 FINDINGS: There is no edema or consolidation. Heart is upper normal in size with pulmonary vascularity normal. No adenopathy. No pneumothorax. No bone lesions. IMPRESSION: No edema or consolidation.  Stable cardiac silhouette. Electronically Signed   By: Lowella Grip III M.D.   On: 06/12/2018 11:01   Dg Swallowing Func-speech Pathology  Result Date: 07/09/2018 Objective Swallowing Evaluation: Type of Study: MBS-Modified Barium Swallow Study  Patient Details Name: [name] MRN: [id number] Date of Birth: 05-Apr-1940 Today's Date: 07/09/2018 Time: SLP Start Time (ACUTE ONLY): 0845 -SLP Stop Time (ACUTE ONLY): 0905 SLP Time Calculation (min) (ACUTE ONLY): 20 min Past Medical History: Past Medical History: Diagnosis Date . Anxiety  . Dementia ([place])  . Depression  . Diabetes mellitus  . Dyspnea  . GERD (gastroesophageal reflux disease)  . Headache(784.0)  . HTN (hypertension)   dr Johnsie Cancel . Hypercholesteremia   DENIES . Osteoarthritis  . PONV (postoperative nausea and vomiting)  . RLS (restless legs syndrome)  . Stress  . UTI (urinary tract infection) 05/27/2014 Past Surgical History: Past Surgical  History: Procedure Laterality Date . ABDOMINAL HYSTERECTOMY   . COLONOSCOPY   . JOINT REPLACEMENT   . left forearm fracture with ORIF Left  . LUMBAR LAMINECTOMY/DECOMPRESSION MICRODISCECTOMY N/A 04/18/2012  Procedure: LUMBAR LAMINECTOMY/DECOMPRESSION MICRODISCECTOMY 3 LEVELS;  Surgeon: Winfield Cunas, MD;  Location: [place];  Service: Neurosurgery;  Laterality: N/A;  Lumbar three-four, lumbar four-five, lumbar five-sacral one decompression. Synovial cyst resection lumbar four-five . ORIF PERIPROSTHETIC FRACTURE Right 07/06/2016  Procedure: OPEN REDUCTION INTERNAL FIXATION (ORIF) PERIPROSTHETIC FRACTURE;  Surgeon: Gaynelle Arabian, MD;  Location: [facility];  Service: Orthopedics;  Laterality: Right;  . REPLACEMENT TOTAL KNEE Right  . WISDOM TOOTH EXTRACTION   HPI: Patient is a 78 y.o. female with PMH: advanced dementia, DM2, HTN, recurrent UTI's, dysphagia, indwelling foley catheter at baseline, who presented with worsening confusion and foul smelling urine. She is being treated for CAUTI and undergoing workup for sepsis. Patient with h/o dysphagia with most recent MBS in 2016, which revealed silent aspiration of honey thick liquids and recommended puree solids only but with significant aspiration risk.  Subjective: alert, verbalizing some Assessment / Plan / Recommendation CHL IP CLINICAL IMPRESSIONS 07/09/2018 Clinical Impression Patient presents with a mild-moderate oral and a moderate pharyngeal dysphagia. She exhibited delays in oral transit of pill in puree and delays in puree transit anterior to posterior in oral cavity. Pharyngeal phase consisted of delayed swallow initation to vallecular sinus with puree solids, and delays to pyriform sinus with tablet, thin and nectar thick liquid consistencies; residuals remaining in pyriform sinus, and lateral channel residuals with thin liquids and nectar thick liquids, and residuals in vallecular and pyriform sinus with puree solids. Patient had one instance of penetration with trace amount of thin liquids which occured during the swallow with large volume straw sip. When SLP presented a dry spoon and pressed down on tongue, patient initiated a swallow which did help in clearing majority of pharyngeal residuals. Patient did not sense penetrate, which remained above vocal cords. Cervical esophageal phase appeared [facility] with full transit of boluses.  SLP Visit Diagnosis Dysphagia, oropharyngeal phase (R13.12) Attention and concentration deficit following -- Frontal lobe and executive function deficit following -- Impact on safety and function Moderate aspiration risk   CHL IP TREATMENT RECOMMENDATION 07/09/2018 Treatment Recommendations Therapy as outlined in treatment  plan below   Prognosis 07/09/2018 Prognosis for Safe Diet Advancement Fair Barriers to Reach Goals Severity of deficits;Time post onset Barriers/Prognosis Comment -- CHL IP DIET RECOMMENDATION 07/09/2018 SLP Diet Recommendations Dysphagia 1 (Puree) solids;Thin liquid Liquid Administration via Cup;No straw Medication Administration Crushed with puree Compensations Minimize environmental distractions;Slow rate;Small sips/bites;Other (Comment) Postural Changes Seated upright at 90 degrees   CHL IP OTHER RECOMMENDATIONS 07/09/2018 Recommended Consults -- Oral Care Recommendations Oral care BID Other Recommendations --   CHL IP FOLLOW UP RECOMMENDATIONS 07/09/2018 Follow up Recommendations Home health SLP;24 hour supervision/assistance   CHL IP FREQUENCY AND DURATION 07/09/2018 Speech Therapy Frequency (ACUTE ONLY) min 2x/week Treatment Duration 1 week      CHL IP ORAL PHASE 07/09/2018 Oral Phase Impaired Oral - Pudding Teaspoon -- Oral - Pudding Cup -- Oral - Honey Teaspoon -- Oral - Honey Cup -- Oral - Nectar Teaspoon -- Oral - Nectar Cup -- Oral - Nectar Straw -- Oral - Thin Teaspoon -- Oral - Thin Cup -- Oral - Thin Straw -- Oral - Puree Weak lingual manipulation;Reduced posterior propulsion;Delayed oral transit Oral - Mech Soft -- Oral - Regular -- Oral - Multi-Consistency -- Oral - Pill Reduced posterior  propulsion;Holding of bolus;Delayed oral transit Oral Phase - Comment --  CHL IP PHARYNGEAL PHASE 07/09/2018 Pharyngeal Phase Impaired Pharyngeal- Pudding Teaspoon -- Pharyngeal -- Pharyngeal- Pudding Cup -- Pharyngeal -- Pharyngeal- Honey Teaspoon -- Pharyngeal -- Pharyngeal- Honey Cup -- Pharyngeal -- Pharyngeal- Nectar Teaspoon -- Pharyngeal -- Pharyngeal- Nectar Cup Delayed swallow initiation-pyriform sinuses;Reduced pharyngeal peristalsis;Pharyngeal residue - valleculae;Pharyngeal residue - pyriform;Lateral channel residue Pharyngeal -- Pharyngeal- Nectar Straw -- Pharyngeal -- Pharyngeal- Thin Teaspoon -- Pharyngeal  -- Pharyngeal- Thin Cup Delayed swallow initiation-pyriform sinuses;Pharyngeal residue - pyriform;Lateral channel residue Pharyngeal -- Pharyngeal- Thin Straw Penetration/Aspiration during swallow;Delayed swallow initiation-pyriform sinuses;Pharyngeal residue - pyriform;Lateral channel residue Pharyngeal Material enters airway, remains ABOVE vocal cords and not ejected out Pharyngeal- Puree Delayed swallow initiation-vallecula;Pharyngeal residue - valleculae;Pharyngeal residue - pyriform;Other (Comment) Pharyngeal -- Pharyngeal- Mechanical Soft -- Pharyngeal -- Pharyngeal- Regular -- Pharyngeal -- Pharyngeal- Multi-consistency -- Pharyngeal -- Pharyngeal- Pill Delayed swallow initiation-pyriform sinuses;Pharyngeal residue - pyriform Pharyngeal -- Pharyngeal Comment --  CHL IP CERVICAL ESOPHAGEAL PHASE 07/09/2018 Cervical Esophageal Phase WFL Pudding Teaspoon -- Pudding Cup -- Honey Teaspoon -- Honey Cup -- Nectar Teaspoon -- Nectar Cup -- Nectar Straw -- Thin Teaspoon -- Thin Cup -- Thin Straw -- Puree -- Mechanical Soft -- Regular -- Multi-consistency -- Pill -- Cervical Esophageal Comment -- Dannial Monarch 07/09/2018, 10:13 AM    Sonia Baller, MA, CCC-SLP Speech Therapy [facility]               Subjective: Patient seen and examined the bedside this morning.  Hemodynamically stable.  Comfortable.  Denies any discomfort.  Mental status is at baseline.  Discharge Exam: Vitals:   07/10/18 1951 06/30/2018 0443  BP: 90/62 133/77  Pulse: 93 74  Resp: 16 19  Temp: 98.5 F (36.9 C) 98.5 F (36.9 C)  SpO2: 100% 96%   Vitals:   07/10/18 0517 07/10/18 1420 07/10/18 1951 07/19/2018 0443  BP: (!) 144/98 122/90 90/62 133/77  Pulse: 93 91 93 74  Resp: (!) 24  16 19   Temp: 98.5 F (36.9 C) 98.6 F (37 C) 98.5 F (36.9 C) 98.5 F (36.9 C)  TempSrc:  Oral Oral Oral  SpO2: 99% 99% 100% 96%  Weight:      Height:        General: Pt is alert, awake, not in acute distress Cardiovascular: RRR,  S1/S2 +, no rubs, no gallops Respiratory: CTA bilaterally, no wheezing, no rhonchi Abdominal: Soft, NT, ND, bowel sounds + Extremities: no edema, no cyanosis    The results of significant diagnostics from this hospitalization (including imaging, microbiology, ancillary and laboratory) are listed below for reference.     Microbiology: Recent Results (from the past 240 hour(s))  Blood culture (routine x 2)     Status: None (Preliminary result)   Collection Time: 07/06/18  6:06 PM  Result Value Ref Range Status   Specimen Description BLOOD RIGHT HAND  Final   Special Requests   Final    BOTTLES DRAWN AEROBIC AND ANAEROBIC Blood Culture results may not be optimal due to an inadequate volume of blood received in culture bottles   Culture   Final    NO GROWTH 4 DAYS Performed at [facility], [place] [address] [place], [place] [address]    Report Status PENDING  Incomplete  Blood culture (routine x 2)     Status: Abnormal (Preliminary result)   Collection Time: 07/06/18  6:13 PM  Result Value Ref Range Status   Specimen Description BLOOD  LEFT HAND  Final   Special Requests   Final    BOTTLES DRAWN AEROBIC AND ANAEROBIC Blood Culture adequate volume   Culture  Setup Time   Final    ANAEROBIC BOTTLE ONLY GRAM POSITIVE COCCI IN PAIRS RESULT CALLED TO, READ BACK BY AND VERIFIED WITH: BETH GREENE @ 5638 ON 07/08/18 BY ROBINSON Z.  AEROBIC BOTTLE ONLY GRAM POSITIVE RODS CRITICAL RESULT CALLED TO, READ BACK BY AND VERIFIED WITH: Lavell Luster [facility] 07/10/18 2340 JDW Performed at [facility], [place] [address] [place], [place] [address]    Culture (A)  Final    STAPHYLOCOCCUS SPECIES (COAGULASE NEGATIVE) THE SIGNIFICANCE OF ISOLATING THIS ORGANISM FROM A SINGLE SET OF BLOOD CULTURES WHEN MULTIPLE SETS ARE DRAWN IS UNCERTAIN. PLEASE NOTIFY THE MICROBIOLOGY DEPARTMENT WITHIN ONE WEEK IF SPECIATION AND SENSITIVITIES ARE REQUIRED. GRAM POSITIVE RODS    Report Status PENDING  Incomplete   Blood Culture ID Panel (Reflexed)     Status: Abnormal   Collection Time: 07/06/18  6:13 PM  Result Value Ref Range Status   Enterococcus species NOT DETECTED NOT DETECTED Final   Listeria monocytogenes NOT DETECTED NOT DETECTED Final   Staphylococcus species DETECTED (A) NOT DETECTED Final    Comment: Methicillin (oxacillin) resistant coagulase negative staphylococcus. Possible blood culture contaminant (unless isolated from more than one blood culture draw or clinical case suggests pathogenicity). No antibiotic treatment is indicated for blood  culture contaminants. CRITICAL RESULT CALLED TO, READ BACK BY AND VERIFIED WITH: BETH GREENE @ 0056 ON 07/08/18 BY ROBINSON Z.     Staphylococcus aureus (BCID) NOT DETECTED NOT DETECTED Final   Methicillin resistance DETECTED (A) NOT DETECTED Final    Comment: CRITICAL RESULT CALLED TO, READ BACK BY AND VERIFIED WITH: BETH GREENE @ 0056 ON 07/08/18 BY ROBINSON Z.     Streptococcus species NOT DETECTED NOT DETECTED Final   Streptococcus agalactiae NOT DETECTED NOT DETECTED Final   Streptococcus pneumoniae NOT DETECTED NOT DETECTED Final   Streptococcus pyogenes NOT DETECTED NOT DETECTED Final   Acinetobacter baumannii NOT DETECTED NOT DETECTED Final   Enterobacteriaceae species NOT DETECTED NOT DETECTED Final   Enterobacter cloacae complex NOT DETECTED NOT DETECTED Final   Escherichia coli NOT DETECTED NOT DETECTED Final   Klebsiella oxytoca NOT DETECTED NOT DETECTED Final   Klebsiella pneumoniae NOT DETECTED NOT DETECTED Final   Proteus species NOT DETECTED NOT DETECTED Final   Serratia marcescens NOT DETECTED NOT DETECTED Final   Haemophilus influenzae NOT DETECTED NOT DETECTED Final   Neisseria meningitidis NOT DETECTED NOT DETECTED Final   Pseudomonas aeruginosa NOT DETECTED NOT DETECTED Final   Candida albicans NOT DETECTED NOT DETECTED Final   Candida glabrata NOT DETECTED NOT DETECTED Final   Candida krusei NOT DETECTED NOT DETECTED  Final   Candida parapsilosis NOT DETECTED NOT DETECTED Final   Candida tropicalis NOT DETECTED NOT DETECTED Final    Comment: Performed at [facility], [place] [address], [place], [place] [address]  Urine culture     Status: Abnormal   Collection Time: 07/06/18  7:43 PM  Result Value Ref Range Status   Specimen Description   Final    URINE, CLEAN CATCH Performed at [facility], [place] [address] [place], [place] [address]    Special Requests   Final    NONE Performed at [facility], [place] [address], [place], [place] [address]    Culture (A)  Final    >=100,000 COLONIES/mL MULTIPLE SPECIES  PRESENT, SUGGEST RECOLLECTION   Report Status 07/08/2018 FINAL  Final  SARS Coronavirus 2 (CEPHEID - Performed in [place] hospital lab), Hosp Order     Status: None   Collection Time: 07/06/18  8:11 PM  Result Value Ref Range Status   SARS Coronavirus 2 NEGATIVE NEGATIVE Final    Comment: (NOTE) If result is NEGATIVE SARS-CoV-2 target nucleic acids are NOT DETECTED. The SARS-CoV-2 RNA is generally detectable in upper and lower  respiratory specimens during the acute phase of infection. The lowest  concentration of SARS-CoV-2 viral copies this assay can detect is 250  copies / mL. A negative result does not preclude SARS-CoV-2 infection  and should not be used as the sole basis for treatment or other  patient management decisions.  A negative result may occur with  improper specimen collection / handling, submission of specimen other  than nasopharyngeal swab, presence of viral mutation(s) within the  areas targeted by this assay, and inadequate number of viral copies  (<250 copies / mL). A negative result must be combined with clinical  observations, patient history, and epidemiological information. If result is POSITIVE SARS-CoV-2 target nucleic acids are DETECTED. The SARS-CoV-2 RNA is generally detectable in upper and lower  respiratory specimens  dur ing the acute phase of infection.  Positive  results are indicative of active infection with SARS-CoV-2.  Clinical  correlation with patient history and other diagnostic information is  necessary to determine patient infection status.  Positive results do  not rule out bacterial infection or co-infection with other viruses. If result is PRESUMPTIVE POSTIVE SARS-CoV-2 nucleic acids MAY BE PRESENT.   A presumptive positive result was obtained on the submitted specimen  and confirmed on repeat testing.  While 2019 novel coronavirus  (SARS-CoV-2) nucleic acids may be present in the submitted sample  additional confirmatory testing may be necessary for epidemiological  and / or clinical management purposes  to differentiate between  SARS-CoV-2 and other Sarbecovirus currently known to infect humans.  If clinically indicated additional testing with an alternate test  methodology [phone]) is advised. The SARS-CoV-2 RNA is generally  detectable in upper and lower respiratory sp ecimens during the acute  phase of infection. The expected result is Negative. Fact Sheet for Patients:  StrictlyIdeas.no Fact Sheet for Healthcare Providers: BankingDealers.co.za This test is not yet approved or cleared by the [place] FDA and has been authorized for detection and/or diagnosis of SARS-CoV-2 by FDA under an Emergency Use Authorization (EUA).  This EUA will remain in effect (meaning this test can be used) for the duration of the COVID-19 declaration under Section 564(b)(1) of the Act, 21 U.S.C. section 360bbb-3(b)(1), unless the authorization is terminated or revoked sooner. Performed at [facility], [place] [address] [place], [place] [address]      Labs: BNP (last 3 results) No results for input(s): BNP in the last 8760 hours. Basic Metabolic Panel: Recent Labs  Lab 07/07/18 1640 07/08/18 0556 07/09/18 1814  07/10/18 0859 06/24/2018 0752  NA 153* 151* 148* 144 144  K 4.0 3.7 3.2* 3.2* 4.1  CL 124* 123* 120* 117* 119*  CO2 20* 19* 19* 19* 16*  GLUCOSE 244* 228* 174* 213* 116*  BUN 61* 56* 42* 33* 30*  CREATININE 1.23* 1.09* 0.98 0.80 0.73  CALCIUM 9.4 9.3 9.2 9.0 8.9   Liver Function Tests: Recent Labs  Lab 07/06/18 1811 07/10/18 0859  AST 15 16  ALT 18 18  ALKPHOS 72 60  BILITOT 0.8 0.7  PROT 7.6 6.3*  ALBUMIN 3.4* 3.0*   No results for input(s): LIPASE, AMYLASE in the last 168 hours. No results for input(s): AMMONIA in the last 168 hours. CBC: Recent Labs  Lab 07/06/18 1811 07/07/18 0706 07/08/18 0556 07/10/18 0859  WBC 13.7* 11.9* 9.7 8.3  NEUTROABS 11.3*  --   --   --   HGB 11.4* 10.5* 9.7* 10.2*  HCT 37.8 36.2 32.7* 35.2*  MCV 85.1 88.3 84.1 88.4  PLT 348 288 212 283   Cardiac Enzymes: Recent Labs  Lab 07/06/18 1811  TROPONINI <0.03   BNP: Invalid input(s): POCBNP CBG: Recent Labs  Lab 07/10/18 0729 07/10/18 1239 07/10/18 1758 07/10/18 2110 07/03/2018 0749  GLUCAP 199* 190* 136* 113* 113*   D-Dimer No results for input(s): DDIMER in the last 72 hours. Hgb A1c No results for input(s): HGBA1C in the last 72 hours. Lipid Profile No results for input(s): CHOL, HDL, LDLCALC, TRIG, CHOLHDL, LDLDIRECT in the last 72 hours. Thyroid function studies No results for input(s): TSH, T4TOTAL, T3FREE, THYROIDAB in the last 72 hours.  Invalid input(s): FREET3 Anemia work up No results for input(s): VITAMINB12, FOLATE, FERRITIN, TIBC, IRON, RETICCTPCT in the last 72 hours. Urinalysis    Component Value Date/Time   COLORURINE YELLOW 07/06/2018 1943   APPEARANCEUR TURBID (A) 07/06/2018 1943   LABSPEC 1.018 07/06/2018 1943   PHURINE 5.0 07/06/2018 1943   GLUCOSEU >=500 (A) 07/06/2018 1943   HGBUR MODERATE (A) 07/06/2018 [place] 07/06/2018 [place] 07/06/2018 1943   PROTEINUR 100 (A) 07/06/2018 1943   UROBILINOGEN 0.2  07/20/2014 2228   NITRITE NEGATIVE 07/06/2018 1943   LEUKOCYTESUR MODERATE (A) 07/06/2018 1943   Sepsis Labs Invalid input(s): PROCALCITONIN,  WBC,  LACTICIDVEN Microbiology Recent Results (from the past 240 hour(s))  Blood culture (routine x 2)     Status: None (Preliminary result)   Collection Time: 07/06/18  6:06 PM  Result Value Ref Range Status   Specimen Description BLOOD RIGHT HAND  Final   Special Requests   Final    BOTTLES DRAWN AEROBIC AND ANAEROBIC Blood Culture results may not be optimal due to an inadequate volume of blood received in culture bottles   Culture   Final    NO GROWTH 4 DAYS Performed at [facility], [place] [address], [place], [place] [address]    Report Status PENDING  Incomplete  Blood culture (routine x 2)     Status: Abnormal (Preliminary result)   Collection Time: 07/06/18  6:13 PM  Result Value Ref Range Status   Specimen Description BLOOD LEFT HAND  Final   Special Requests   Final    BOTTLES DRAWN AEROBIC AND ANAEROBIC Blood Culture adequate volume   Culture  Setup Time   Final    ANAEROBIC BOTTLE ONLY GRAM POSITIVE COCCI IN PAIRS RESULT CALLED TO, READ BACK BY AND VERIFIED WITH: BETH GREENE @ 2440 ON 07/08/18 BY ROBINSON Z.  AEROBIC BOTTLE ONLY GRAM POSITIVE RODS CRITICAL RESULT CALLED TO, READ BACK BY AND VERIFIED WITH: [facility] 07/10/18 2340 JDW Performed at [facility], [place] [address], [place], [place] [address]    Culture (A)  Final    STAPHYLOCOCCUS SPECIES (COAGULASE NEGATIVE) THE SIGNIFICANCE OF ISOLATING THIS ORGANISM FROM A SINGLE SET OF BLOOD CULTURES WHEN MULTIPLE SETS ARE DRAWN IS UNCERTAIN. PLEASE NOTIFY THE MICROBIOLOGY DEPARTMENT WITHIN ONE WEEK IF SPECIATION AND SENSITIVITIES ARE REQUIRED. GRAM POSITIVE RODS    Report Status  PENDING  Incomplete  Blood Culture ID Panel (Reflexed)     Status: Abnormal   Collection Time: 07/06/18  6:13 PM  Result Value Ref Range Status   Enterococcus species NOT DETECTED  NOT DETECTED Final   Listeria monocytogenes NOT DETECTED NOT DETECTED Final   Staphylococcus species DETECTED (A) NOT DETECTED Final    Comment: Methicillin (oxacillin) resistant coagulase negative staphylococcus. Possible blood culture contaminant (unless isolated from more than one blood culture draw or clinical case suggests pathogenicity). No antibiotic treatment is indicated for blood  culture contaminants. CRITICAL RESULT CALLED TO, READ BACK BY AND VERIFIED WITH: BETH GREENE @ 0056 ON 07/08/18 BY ROBINSON Z.     Staphylococcus aureus (BCID) NOT DETECTED NOT DETECTED Final   Methicillin resistance DETECTED (A) NOT DETECTED Final    Comment: CRITICAL RESULT CALLED TO, READ BACK BY AND VERIFIED WITH: BETH GREENE @ 0056 ON 07/08/18 BY ROBINSON Z.     Streptococcus species NOT DETECTED NOT DETECTED Final   Streptococcus agalactiae NOT DETECTED NOT DETECTED Final   Streptococcus pneumoniae NOT DETECTED NOT DETECTED Final   Streptococcus pyogenes NOT DETECTED NOT DETECTED Final   Acinetobacter baumannii NOT DETECTED NOT DETECTED Final   Enterobacteriaceae species NOT DETECTED NOT DETECTED Final   Enterobacter cloacae complex NOT DETECTED NOT DETECTED Final   Escherichia coli NOT DETECTED NOT DETECTED Final   Klebsiella oxytoca NOT DETECTED NOT DETECTED Final   Klebsiella pneumoniae NOT DETECTED NOT DETECTED Final   Proteus species NOT DETECTED NOT DETECTED Final   Serratia marcescens NOT DETECTED NOT DETECTED Final   Haemophilus influenzae NOT DETECTED NOT DETECTED Final   Neisseria meningitidis NOT DETECTED NOT DETECTED Final   Pseudomonas aeruginosa NOT DETECTED NOT DETECTED Final   Candida albicans NOT DETECTED NOT DETECTED Final   Candida glabrata NOT DETECTED NOT DETECTED Final   Candida krusei NOT DETECTED NOT DETECTED Final   Candida parapsilosis NOT DETECTED NOT DETECTED Final   Candida tropicalis NOT DETECTED NOT DETECTED Final    Comment: Performed at [facility],  [place] [address], [place], [place] [address]  Urine culture     Status: Abnormal   Collection Time: 07/06/18  7:43 PM  Result Value Ref Range Status   Specimen Description   Final    URINE, CLEAN CATCH Performed at [facility], [place] [address], [place], [place] [address]    Special Requests   Final    NONE Performed at [facility], [place] [address] [place], [place] [address]    Culture (A)  Final    >=100,000 COLONIES/mL MULTIPLE SPECIES PRESENT, SUGGEST RECOLLECTION   Report Status 07/08/2018 FINAL  Final  SARS Coronavirus 2 (CEPHEID - Performed in [place] hospital lab), Hosp Order     Status: None   Collection Time: 07/06/18  8:11 PM  Result Value Ref Range Status   SARS Coronavirus 2 NEGATIVE NEGATIVE Final    Comment: (NOTE) If result is NEGATIVE SARS-CoV-2 target nucleic acids are NOT DETECTED. The SARS-CoV-2 RNA is generally detectable in upper and lower  respiratory specimens during the acute phase of infection. The lowest  concentration of SARS-CoV-2 viral copies this assay can detect is 250  copies / mL. A negative result does not preclude SARS-CoV-2 infection  and should not be used as the sole basis for treatment or other  patient management decisions.  A negative result may occur with  improper specimen collection / handling, submission of specimen other  than nasopharyngeal swab,  presence of viral mutation(s) within the  areas targeted by this assay, and inadequate number of viral copies  (<250 copies / mL). A negative result must be combined with clinical  observations, patient history, and epidemiological information. If result is POSITIVE SARS-CoV-2 target nucleic acids are DETECTED. The SARS-CoV-2 RNA is generally detectable in upper and lower  respiratory specimens dur ing the acute phase of infection.  Positive  results are indicative of active infection with SARS-CoV-2.  Clinical  correlation with patient history and other  diagnostic information is  necessary to determine patient infection status.  Positive results do  not rule out bacterial infection or co-infection with other viruses. If result is PRESUMPTIVE POSTIVE SARS-CoV-2 nucleic acids MAY BE PRESENT.   A presumptive positive result was obtained on the submitted specimen  and confirmed on repeat testing.  While 2019 novel coronavirus  (SARS-CoV-2) nucleic acids may be present in the submitted sample  additional confirmatory testing may be necessary for epidemiological  and / or clinical management purposes  to differentiate between  SARS-CoV-2 and other Sarbecovirus currently known to infect humans.  If clinically indicated additional testing with an alternate test  methodology [phone]) is advised. The SARS-CoV-2 RNA is generally  detectable in upper and lower respiratory sp ecimens during the acute  phase of infection. The expected result is Negative. Fact Sheet for Patients:  StrictlyIdeas.no Fact Sheet for Healthcare Providers: BankingDealers.co.za This test is not yet approved or cleared by the [place] FDA and has been authorized for detection and/or diagnosis of SARS-CoV-2 by FDA under an Emergency Use Authorization (EUA).  This EUA will remain in effect (meaning this test can be used) for the duration of the COVID-19 declaration under Section 564(b)(1) of the Act, 21 U.S.C. section 360bbb-3(b)(1), unless the authorization is terminated or revoked sooner. Performed at [facility], [place] [address], [place], [place] [address]     Please note: You were cared for by a hospitalist during your hospital stay. Once you are discharged, your primary care physician will handle any further medical issues. Please note that NO REFILLS for any discharge medications will be authorized once you are discharged, as it is imperative that you return to your primary care physician (or  establish a relationship with a primary care physician if you do not have one) for your post hospital discharge needs so that they can reassess your need for medications and monitor your lab values.    Time coordinating discharge: 40 minutes  SIGNED:   Shelly Coss, MD  [facility] 06/22/2018, 10:44 AM Pager [phone]  If 7PM-7AM, please contact night-coverage [email or web address] Password [email or web address]

## 2018-07-23 NOTE — Progress Notes (Signed)
Chaplain responded to referral by nursing re: end of life.  Present with pt's daughter at bedside during pt death.  Proving support around grief and loss.    Provided daughter with pt placement contact information.

## 2018-07-23 NOTE — Progress Notes (Addendum)
Subjective/Chief Complaint: Opens eyes when aroused but nothing more   Objective: Vital signs in last 24 hours: Temp:  [97.7 F (36.5 C)-98.9 F (37.2 C)] 97.9 F (36.6 C) (05/25 0335) Pulse Rate:  [75-103] 75 (05/25 0600) Resp:  [16-34] 21 (05/25 0600) BP: (85-129)/(42-63) 129/48 (05/25 0600) SpO2:  [94 %-100 %] 98 % (05/25 0600) Last BM Date: 07/15/18  Intake/Output from previous day: 05/24 0701 - 05/25 0700 In: 2526.8 [I.V.:2081.2; IV Piggyback:440.6] Out: 710 [Urine:700; Drains:10] Intake/Output this shift: No intake/output data recorded.  General appearance: arouses but does not interact Resp: clear to auscultation bilaterally Cardio: regular rate and rhythm GI: soft, no obvious tenderness. drain output bilious  Lab Results:  Recent Labs    07/15/18 0313 07-26-18 0339  WBC 18.8* 11.5*  HGB 9.6* 8.1*  HCT 33.7* 28.4*  PLT 226 170   BMET Recent Labs    07/15/18 0313 26-Jul-2018 0339  NA 145 147*  K 3.5 2.4*  CL 119* 116*  CO2 16* 20*  GLUCOSE 248* 155*  BUN 54* 57*  CREATININE 1.93* 1.72*  CALCIUM 8.0* 7.9*   PT/INR Recent Labs    07/14/18 0829  LABPROT 18.2*  INR 1.5*   ABG No results for input(s): PHART, HCO3 in the last 72 hours.  Invalid input(s): PCO2, PO2  Studies/Results: Ir Perc Cholecystostomy  Result Date: 07/14/2018 CLINICAL DATA:  Acute cholecystitis, sepsis, dementia EXAM: PERCUTANEOUS CHOLECYSTOSTOMY TUBE PLACEMENT WITH ULTRASOUND AND FLUOROSCOPIC GUIDANCE FLUOROSCOPY TIME:  0.7 minutes; 116 uGym2 DAP TECHNIQUE: The procedure, risks (including but not limited to bleeding, infection, organ damage ), benefits, and alternatives were explained to the daughter over the telephone. Questions regarding the procedure were encouraged and answered. The daughter understands and consents to the procedure. Survey ultrasound of the abdomen was performed and an appropriate skin entry site was identified. Skin site was marked, prepped with  chlorhexidine, and draped in usual sterile fashion, and infiltrated locally with 1% lidocaine. Intravenous Fentanyl 17mcg and Versed 0.5mg  were administered as conscious sedation during continuous monitoring of the patient's level of consciousness and physiological / cardiorespiratory status by the radiology RN, with a total moderate sedation time of 11 minutes. Under real-time ultrasound guidance, gallbladder was accessed using a transhepatic approach with a 21-gauge needle. Ultrasound image documentation was saved. Bile returned through the hub. Needle was exchanged over a 018 guidewire for transitional dilator which allowed placement of 035 J wire. Over this, a 10.2 French pigtail catheter was advanced and formed centrally in the gallbladder lumen. 20 mL of dark green bilious aspirate sent for Gram stain and culture. Small contrast injection confirmed appropriate position. Catheter secured externally with 0 Prolene suture and placed external drain bag. Patient tolerated the procedure well. COMPLICATIONS: COMPLICATIONS none IMPRESSION: 1. Technically successful percutaneous cholecystostomy tube placement with ultrasound and fluoroscopic guidance. Electronically Signed   By: Lucrezia Europe M.D.   On: 07/14/2018 11:26    Anti-infectives: Anti-infectives (From admission, onward)   Start     Dose/Rate Route Frequency Ordered Stop   07/15/18 2200  vancomycin (VANCOCIN) 1,500 mg in sodium chloride 0.9 % 500 mL IVPB  Status:  Discontinued     1,500 mg 250 mL/hr over 120 Minutes Intravenous Every 48 hours 07/14/18 0506 07/15/18 1349   07/14/18 2000  ceFEPIme (MAXIPIME) 2 g in sodium chloride 0.9 % 100 mL IVPB     2 g 200 mL/hr over 30 Minutes Intravenous Every 24 hours 07/14/18 0506     07/14/18 0600  metroNIDAZOLE (FLAGYL)  IVPB 500 mg     500 mg 100 mL/hr over 60 Minutes Intravenous Every 8 hours 07/14/18 0117     07/12/2018 2345  vancomycin (VANCOCIN) IVPB 750 mg/150 ml premix     750 mg 150 mL/hr over 60  Minutes Intravenous  Once 07/07/2018 2335 07/14/18 0234   06/30/2018 2000  ceFEPIme (MAXIPIME) 2 g in sodium chloride 0.9 % 100 mL IVPB     2 g 200 mL/hr over 30 Minutes Intravenous  Once 07/08/2018 1956 07/15/2018 2129   07/12/2018 2000  metroNIDAZOLE (FLAGYL) IVPB 500 mg     500 mg 100 mL/hr over 60 Minutes Intravenous  Once 07/18/2018 1956 06/27/2018 2223   07/17/2018 2000  vancomycin (VANCOCIN) IVPB 1000 mg/200 mL premix     1,000 mg 200 mL/hr over 60 Minutes Intravenous  Once 07/09/2018 1956 07/06/2018 2331      Assessment/Plan: s/p * No surgery found * Advance diet  Continue abx and drain for cholecystitis. No plan for surgery at this point Underlying medical conditions make her a poor surgical candidate but could reconsider if she becomes more alert  LOS: 3 days    Autumn Messing III August 03, 2018

## 2018-07-23 NOTE — Progress Notes (Signed)
Patient's daughter called to get information. This nurse asked if they have a password set up and if they would like to set one up. The daughter responded by saying "no that she has never heard of that". This nurse proceeded to ask for a birthday for confirmation and told the daughter that we were following protocol by asking. The daughter responded by saying she "would like to be treated how all the other nurses treated her". This nurse proceeded by asking the daughter what information she already knew and I could confirm or deny and the daughter responded by saying no, she would "call back in the morning to talk to another nurse".

## 2018-07-23 DEATH — deceased

## 2018-07-24 ENCOUNTER — Telehealth: Payer: Medicare Other | Admitting: Cardiovascular Disease

## 2018-08-22 NOTE — Death Summary Note (Signed)
Death Summary  Keeleigh Terris Broberg ZOX:[id number] DOB: 10-May-1940 DOA: 07-15-18  PCP: [name], MD  Admit date: 2018/07/15 Date of Death: 2018/08/02 Time of Death: 4.30 pm Notification: [name], MD notified of death of 02-Aug-2018   History of present illness:  Patient is an 78 year old female with history of advanced dementia, diabetes mellitus type 2, hypertension, recurrent UTIs, dysphagia, indwelling Foley catheter at baseline who was brought to the emergency department with complaints of altered mental status.  She was complaining of abdomen pain at home, was vomiting.  She was recently admitted on 07/06/2018 and was discharged on 06/28/2018 after being treated for severe sepsis secondary to catheter associated urinary tract infection and was treated with IV antibiotics. CT abdomen/pelvis done on presentation showed dilated gallbladder with gallstones and features highly suspicious for acute cholecystitis.  General surgery consulted.  IR did percutaneous drain/cholecystostomy on 07/14/18.  Started on antibiotics.  Also suspected to be in DKA on presentation and started on insulin drip. Hospital course  remarkable for persistent lethargy, weakness. On 2018/07/18, at around 4 Pm, she became tachycardic.  Patient was found to have vomited resulting in aspiration.  Suctioning done with little improvement.  Patient saturation continued to drop. Went into asystole. Death declared at 4:30 PM  Final Diagnoses:  1.   Aspiration PNA   The results of significant diagnostics from this hospitalization (including imaging, microbiology, ancillary and laboratory) are listed below for reference.    Significant Diagnostic Studies: Ct Abdomen Pelvis Wo Contrast  Result Date: Jul 15, 2018 CLINICAL DATA:  History of recent UTI with generalized abdominal pain EXAM: CT ABDOMEN AND PELVIS WITHOUT CONTRAST TECHNIQUE: Multidetector CT imaging of the abdomen and pelvis was performed following the standard  protocol without IV contrast. COMPARISON:  08/10/2016 FINDINGS: Lower chest: Mild scarring is noted in the bases bilaterally. No focal infiltrate or effusion is seen. Hepatobiliary: Gallbladder is significantly distended with pericholecystic inflammatory change and dependent cholelithiasis. These changes are consistent with acute cholecystitis till proven otherwise. Ultrasound may be helpful for further evaluation. Pancreas: Unremarkable. No pancreatic ductal dilatation or surrounding inflammatory changes. Spleen: Normal in size without focal abnormality. Adrenals/Urinary Tract: Adrenal glands are within normal limits bilaterally. Kidneys are unremarkable. No renal calculi or obstructive changes are noted. The bladder is decompressed by Foley catheter. Stomach/Bowel: Contrast is noted throughout the large bowel. No obstructive or inflammatory changes are seen. The appendix is not well visualized although no inflammatory changes are seen. Stomach is distended with fluid with evidence of reflux into the distal esophagus. No small bowel abnormality is noted. Vascular/Lymphatic: Aortic atherosclerosis. No enlarged abdominal or pelvic lymph nodes. Reproductive: Status post hysterectomy. No adnexal masses. Other: No abdominal wall hernia or abnormality. No abdominopelvic ascites. Musculoskeletal: Right hip prosthesis is noted. Degenerative changes of lumbar spine are noted. Prior vertebral augmentation at L3 is noted. No acute abnormality is seen. IMPRESSION: Significantly dilated gallbladder with gallstones within. Considerable peri cholecystic inflammatory changes are noted consistent with acute cholecystitis. Right upper quadrant ultrasound may be helpful for further evaluation. Chronic changes as described above. Electronically Signed   By: Inez Catalina M.D.   On: 2018-07-15 20:47   Dg Chest 1 View  Result Date: Jul 18, 2018 CLINICAL DATA:  78 year old female with shortness of breath EXAM: CHEST  1 VIEW COMPARISON:   2018-07-15, 07/06/2018 FINDINGS: Cardiomediastinal silhouette unchanged in size and contour. Similar appearance of low lung volumes without pneumothorax or pleural effusion. Linear opacities in the hilar and infrahilar regions without interlobular septal thickening or fullness in the  vasculature. No confluent airspace disease. No displaced fracture. IMPRESSION: Low lung volumes and atelectasis without evidence of acute cardiopulmonary disease Electronically Signed   By: Corrie Mckusick D.O.   On: Jul 21, 2018 14:32   Ct Head Wo Contrast  Result Date: 07/15/2018 CLINICAL DATA:  Altered mental status, history of UTIs EXAM: CT HEAD WITHOUT CONTRAST TECHNIQUE: Contiguous axial images were obtained from the base of the skull through the vertex without intravenous contrast. COMPARISON:  04/19/2018 FINDINGS: Brain: Chronic atrophic changes are noted with ventriculomegaly. Changes of chronic white matter ischemic change are again noted. No findings to suggest acute hemorrhage, acute infarction or space-occupying mass lesion are noted. Vascular: No hyperdense vessel or unexpected calcification. Skull: Normal. Negative for fracture or focal lesion. Sinuses/Orbits: No acute finding. Other: None. IMPRESSION: Atrophic and ischemic changes without acute abnormality. Electronically Signed   By: Inez Catalina M.D.   On: 07/12/2018 20:41   Ir Perc Cholecystostomy  Result Date: 07/14/2018 CLINICAL DATA:  Acute cholecystitis, sepsis, dementia EXAM: PERCUTANEOUS CHOLECYSTOSTOMY TUBE PLACEMENT WITH ULTRASOUND AND FLUOROSCOPIC GUIDANCE FLUOROSCOPY TIME:  0.7 minutes; 116 uGym2 DAP TECHNIQUE: The procedure, risks (including but not limited to bleeding, infection, organ damage ), benefits, and alternatives were explained to the daughter over the telephone. Questions regarding the procedure were encouraged and answered. The daughter understands and consents to the procedure. Survey ultrasound of the abdomen was performed and an  appropriate skin entry site was identified. Skin site was marked, prepped with chlorhexidine, and draped in usual sterile fashion, and infiltrated locally with 1% lidocaine. Intravenous Fentanyl 50mcg and Versed 0.5mg  were administered as conscious sedation during continuous monitoring of the patient's level of consciousness and physiological / cardiorespiratory status by the radiology RN, with a total moderate sedation time of 11 minutes. Under real-time ultrasound guidance, gallbladder was accessed using a transhepatic approach with a 21-gauge needle. Ultrasound image documentation was saved. Bile returned through the hub. Needle was exchanged over a 018 guidewire for transitional dilator which allowed placement of 035 J wire. Over this, a 10.2 French pigtail catheter was advanced and formed centrally in the gallbladder lumen. 20 mL of dark green bilious aspirate sent for Gram stain and culture. Small contrast injection confirmed appropriate position. Catheter secured externally with 0 Prolene suture and placed external drain bag. Patient tolerated the procedure well. COMPLICATIONS: COMPLICATIONS none IMPRESSION: 1. Technically successful percutaneous cholecystostomy tube placement with ultrasound and fluoroscopic guidance. Electronically Signed   By: Lucrezia Europe M.D.   On: 07/14/2018 11:26   Dg Chest Port 1 View  Result Date: 06/29/2018 CLINICAL DATA:  Altered mental status, history of recent UTI, possible sepsis EXAM: PORTABLE CHEST 1 VIEW COMPARISON:  07/06/2018 FINDINGS: The heart size and mediastinal contours are within normal limits. Both lungs are clear. The visualized skeletal structures are unremarkable. IMPRESSION: No active disease. Electronically Signed   By: Inez Catalina M.D.   On: 06/23/2018 20:48   Dg Chest Port 1 View  Result Date: 07/06/2018 CLINICAL DATA:  Fever. EXAM: PORTABLE CHEST 1 VIEW COMPARISON:  Radiograph 06/12/2018 FINDINGS: Low lung volumes.The cardiomediastinal contours are  unchanged with mild aortic tortuosity. Pulmonary vasculature is normal. No consolidation, pleural effusion, or pneumothorax. No acute osseous abnormalities are seen. IMPRESSION: Low lung volumes without acute abnormality. Electronically Signed   By: Keith Rake M.D.   On: 07/06/2018 19:51   Dg Swallowing Func-speech Pathology  Result Date: 07/09/2018 Objective Swallowing Evaluation: Type of Study: MBS-Modified Barium Swallow Study  Patient Details Name: JEANEAN HOLLETT MRN: [id number]  Date of Birth: 20-Sep-1940 Today's Date: 07/09/2018 Time: SLP Start Time (ACUTE ONLY): 0845 -SLP Stop Time (ACUTE ONLY): 0905 SLP Time Calculation (min) (ACUTE ONLY): 20 min Past Medical History: Past Medical History: Diagnosis Date  Anxiety   Dementia ([place])   Depression   Diabetes mellitus   Dyspnea   GERD (gastroesophageal reflux disease)   Headache(784.0)   HTN (hypertension)   dr Johnsie Cancel  Hypercholesteremia   DENIES  Osteoarthritis   PONV (postoperative nausea and vomiting)   RLS (restless legs syndrome)   Stress   UTI (urinary tract infection) 05/27/2014 Past Surgical History: Past Surgical History: Procedure Laterality Date  ABDOMINAL HYSTERECTOMY    COLONOSCOPY    JOINT REPLACEMENT    left forearm fracture with ORIF Left   LUMBAR LAMINECTOMY/DECOMPRESSION MICRODISCECTOMY N/A 04/18/2012  Procedure: LUMBAR LAMINECTOMY/DECOMPRESSION MICRODISCECTOMY 3 LEVELS;  Surgeon: Winfield Cunas, MD;  Location: [facility];  Service: Neurosurgery;  Laterality: N/A;  Lumbar three-four, lumbar four-five, lumbar five-sacral one decompression. Synovial cyst resection lumbar four-five  ORIF PERIPROSTHETIC FRACTURE Right 07/06/2016  Procedure: OPEN REDUCTION INTERNAL FIXATION (ORIF) PERIPROSTHETIC FRACTURE;  Surgeon: Gaynelle Arabian, MD;  Location: [facility];  Service: Orthopedics;  Laterality: Right;  REPLACEMENT TOTAL KNEE Right   WISDOM TOOTH EXTRACTION   HPI: Patient is a 78 y.o. female with PMH: advanced dementia, DM2, HTN,  recurrent UTI's, dysphagia, indwelling foley catheter at baseline, who presented with worsening confusion and foul smelling urine. She is being treated for CAUTI and undergoing workup for sepsis. Patient with h/o dysphagia with most recent MBS in 2016, which revealed silent aspiration of honey thick liquids and recommended puree solids only but with significant aspiration risk.  Subjective: alert, verbalizing some Assessment / Plan / Recommendation CHL IP CLINICAL IMPRESSIONS 07/09/2018 Clinical Impression Patient presents with a mild-moderate oral and a moderate pharyngeal dysphagia. She exhibited delays in oral transit of pill in puree and delays in puree transit anterior to posterior in oral cavity. Pharyngeal phase consisted of delayed swallow initation to vallecular sinus with puree solids, and delays to pyriform sinus with tablet, thin and nectar thick liquid consistencies; residuals remaining in pyriform sinus, and lateral channel residuals with thin liquids and nectar thick liquids, and residuals in vallecular and pyriform sinus with puree solids. Patient had one instance of penetration with trace amount of thin liquids which occured during the swallow with large volume straw sip. When SLP presented a dry spoon and pressed down on tongue, patient initiated a swallow which did help in clearing majority of pharyngeal residuals. Patient did not sense penetrate, which remained above vocal cords. Cervical esophageal phase appeared [facility] with full transit of boluses.  SLP Visit Diagnosis Dysphagia, oropharyngeal phase (R13.12) Attention and concentration deficit following -- Frontal lobe and executive function deficit following -- Impact on safety and function Moderate aspiration risk   CHL IP TREATMENT RECOMMENDATION 07/09/2018 Treatment Recommendations Therapy as outlined in treatment plan below   Prognosis 07/09/2018 Prognosis for Safe Diet Advancement Fair Barriers to Reach Goals Severity of deficits;Time post  onset Barriers/Prognosis Comment -- CHL IP DIET RECOMMENDATION 07/09/2018 SLP Diet Recommendations Dysphagia 1 (Puree) solids;Thin liquid Liquid Administration via Cup;No straw Medication Administration Crushed with puree Compensations Minimize environmental distractions;Slow rate;Small sips/bites;Other (Comment) Postural Changes Seated upright at 90 degrees   CHL IP OTHER RECOMMENDATIONS 07/09/2018 Recommended Consults -- Oral Care Recommendations Oral care BID Other Recommendations --   CHL IP FOLLOW UP RECOMMENDATIONS 07/09/2018 Follow up Recommendations Home health SLP;24 hour supervision/assistance   CHL IP FREQUENCY AND  DURATION 07/09/2018 Speech Therapy Frequency (ACUTE ONLY) min 2x/week Treatment Duration 1 week      CHL IP ORAL PHASE 07/09/2018 Oral Phase Impaired Oral - Pudding Teaspoon -- Oral - Pudding Cup -- Oral - Honey Teaspoon -- Oral - Honey Cup -- Oral - Nectar Teaspoon -- Oral - Nectar Cup -- Oral - Nectar Straw -- Oral - Thin Teaspoon -- Oral - Thin Cup -- Oral - Thin Straw -- Oral - Puree Weak lingual manipulation;Reduced posterior propulsion;Delayed oral transit Oral - Mech Soft -- Oral - Regular -- Oral - Multi-Consistency -- Oral - Pill Reduced posterior propulsion;Holding of bolus;Delayed oral transit Oral Phase - Comment --  CHL IP PHARYNGEAL PHASE 07/09/2018 Pharyngeal Phase Impaired Pharyngeal- Pudding Teaspoon -- Pharyngeal -- Pharyngeal- Pudding Cup -- Pharyngeal -- Pharyngeal- Honey Teaspoon -- Pharyngeal -- Pharyngeal- Honey Cup -- Pharyngeal -- Pharyngeal- Nectar Teaspoon -- Pharyngeal -- Pharyngeal- Nectar Cup Delayed swallow initiation-pyriform sinuses;Reduced pharyngeal peristalsis;Pharyngeal residue - valleculae;Pharyngeal residue - pyriform;Lateral channel residue Pharyngeal -- Pharyngeal- Nectar Straw -- Pharyngeal -- Pharyngeal- Thin Teaspoon -- Pharyngeal -- Pharyngeal- Thin Cup Delayed swallow initiation-pyriform sinuses;Pharyngeal residue - pyriform;Lateral channel residue  Pharyngeal -- Pharyngeal- Thin Straw Penetration/Aspiration during swallow;Delayed swallow initiation-pyriform sinuses;Pharyngeal residue - pyriform;Lateral channel residue Pharyngeal Material enters airway, remains ABOVE vocal cords and not ejected out Pharyngeal- Puree Delayed swallow initiation-vallecula;Pharyngeal residue - valleculae;Pharyngeal residue - pyriform;Other (Comment) Pharyngeal -- Pharyngeal- Mechanical Soft -- Pharyngeal -- Pharyngeal- Regular -- Pharyngeal -- Pharyngeal- Multi-consistency -- Pharyngeal -- Pharyngeal- Pill Delayed swallow initiation-pyriform sinuses;Pharyngeal residue - pyriform Pharyngeal -- Pharyngeal Comment --  CHL IP CERVICAL ESOPHAGEAL PHASE 07/09/2018 Cervical Esophageal Phase WFL Pudding Teaspoon -- Pudding Cup -- Honey Teaspoon -- Honey Cup -- Nectar Teaspoon -- Nectar Cup -- Nectar Straw -- Thin Teaspoon -- Thin Cup -- Thin Straw -- Puree -- Mechanical Soft -- Regular -- Multi-consistency -- Pill -- Cervical Esophageal Comment -- Dannial Monarch 07/09/2018, 10:13 AM    Sonia Baller, MA, CCC-SLP Speech Therapy [facility]            Microbiology: No results found for this or any previous visit (from the past 240 hour(s)).   Labs: Basic Metabolic Panel: No results for input(s): NA, K, CL, CO2, GLUCOSE, BUN, CREATININE, CALCIUM, MG, PHOS in the last 168 hours. Liver Function Tests: No results for input(s): AST, ALT, ALKPHOS, BILITOT, PROT, ALBUMIN in the last 168 hours. No results for input(s): LIPASE, AMYLASE in the last 168 hours. No results for input(s): AMMONIA in the last 168 hours. CBC: No results for input(s): WBC, NEUTROABS, HGB, HCT, MCV, PLT in the last 168 hours. Cardiac Enzymes: No results for input(s): CKTOTAL, CKMB, CKMBINDEX, TROPONINI in the last 168 hours. D-Dimer No results for input(s): DDIMER in the last 72 hours. BNP: Invalid input(s): POCBNP CBG: No results for input(s): GLUCAP in the last 168 hours. Anemia work up No  results for input(s): VITAMINB12, FOLATE, FERRITIN, TIBC, IRON, RETICCTPCT in the last 72 hours. Urinalysis    Component Value Date/Time   COLORURINE YELLOW 07/22/2018 2012   APPEARANCEUR CLOUDY (A) 06/30/2018 2012   LABSPEC 1.015 07/07/2018 2012   PHURINE 6.0 07/06/2018 2012   GLUCOSEU >=500 (A) 07/14/2018 2012   HGBUR MODERATE (A) 07/09/2018 2012   BILIRUBINUR NEGATIVE 07/21/2018 2012   KETONESUR 20 (A) 07/17/2018 2012   PROTEINUR 100 (A) 06/25/2018 2012   UROBILINOGEN 0.2 07/20/2014 2228   NITRITE NEGATIVE 07/12/2018 2012   LEUKOCYTESUR LARGE (A) 07/21/2018 2012   Sepsis Labs Invalid  input(s): PROCALCITONIN,  WBC,  LACTICIDVEN     SIGNED:  Shelly Coss, MD  [facility] 07/31/2018, 2:07 PM Pager [phone]  If 7PM-7AM, please contact night-coverage [email or web address] Password [email or web address]

## 2020-04-24 IMAGING — CR DG KNEE COMPLETE 4+V*R*
3 series · 3 of 3 positions shown · non-contrast
Comparison: 07/04/2016; 11/09/2013

CLINICAL DATA: Post fall yesterday now with right knee pain.
History of right knee replacement.

EXAM:
RIGHT KNEE - COMPLETE 4+ VIEW

[x knee obl right (1 of 2)]
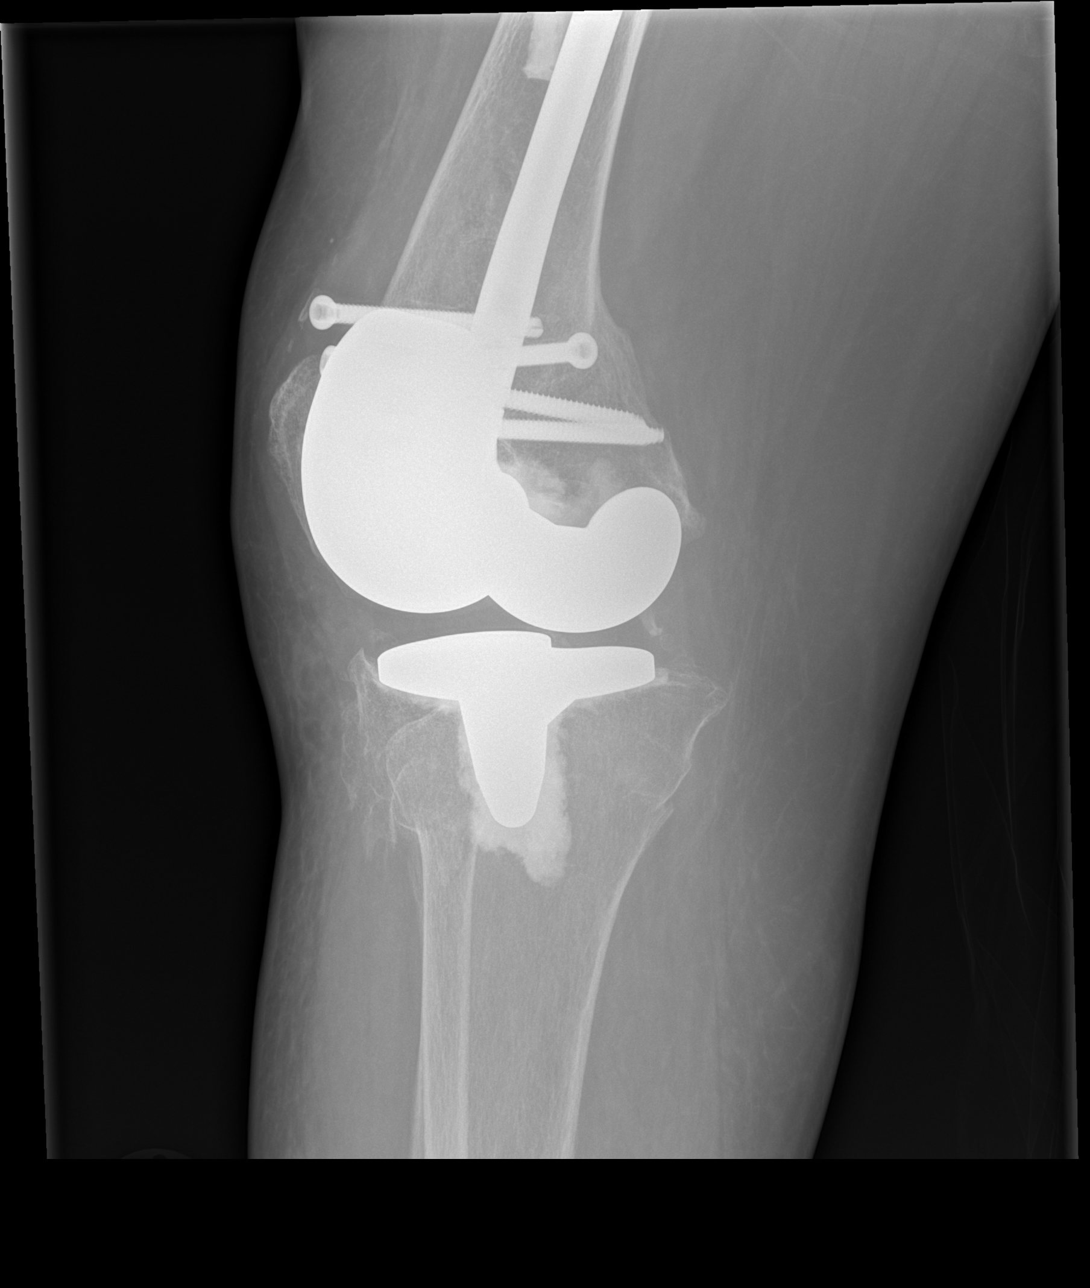

[x knee ap right]
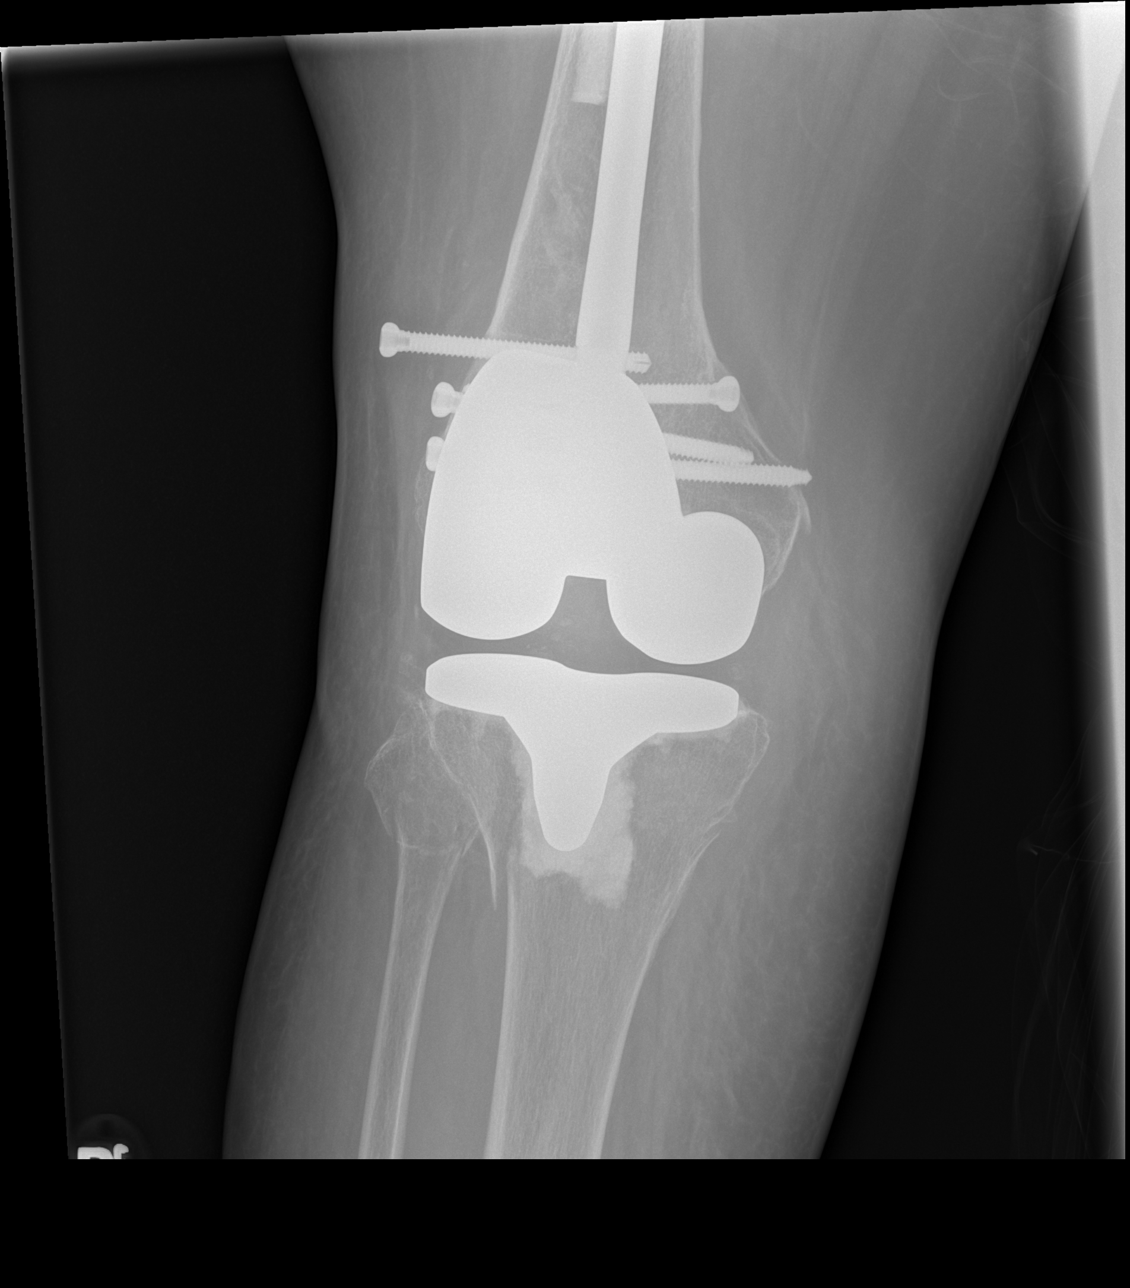

[x knee obl right (2 of 2)]
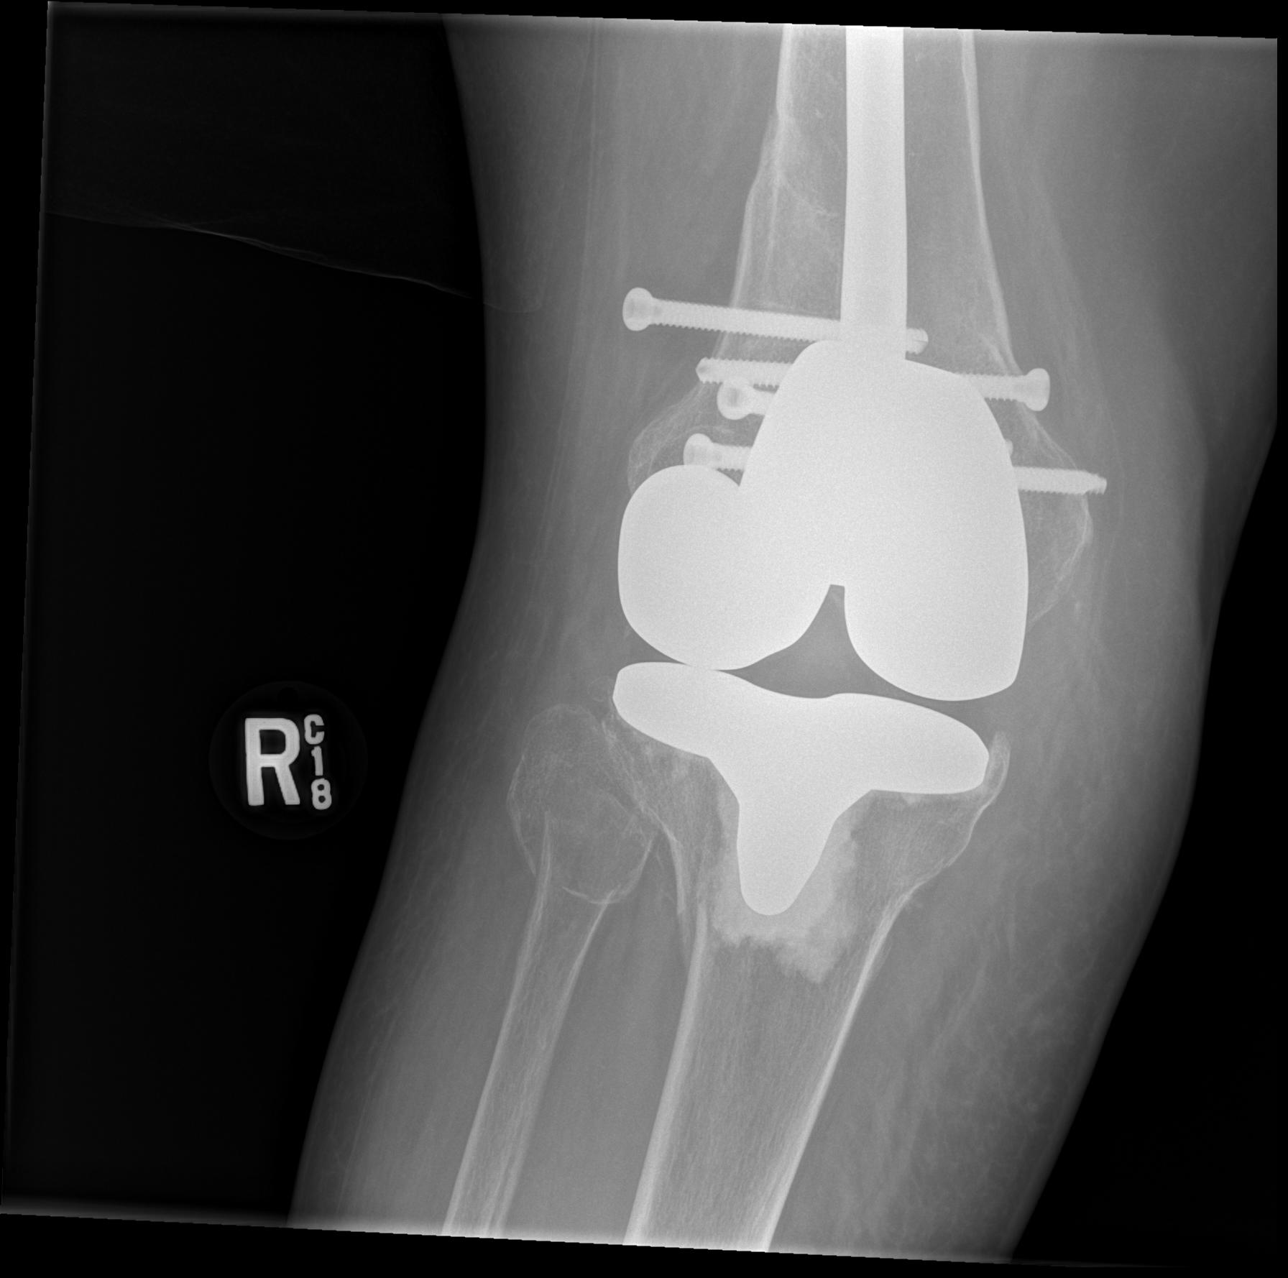

[3 of 3 positions shown; findings below may reference images not displayed]

FINDINGS: There is an acute minimally displaced slightly impacted tibial
plateau fracture with suspected extension to involve the caudal
aspect of the tibial component of the right total knee replacement.
Additionally, there is a comminuted slightly impacted fracture
involving the proximal neck of the fibula with extension to the
proximal tib-fib joint.

No definitive evidence of hardware failure or loosening involving
the femoral component of the right total knee replacement.

Patient has undergone interval intramedullary rod fixation of the
distal femur with persistent deformity about the distal femoral
metaphysis. The femoral rod is transfixed with 4 cancellous screws
however the cranial most cancellous screw is incompletely seated.

Expected adjacent soft tissue swelling. No radiopaque foreign body.
No joint effusion or evidence of lipohemarthrosis.
IMPRESSION: 1. Acute minimally displaced slightly impacted tibial plateau
fracture with suspected extension to involve the tibial component of
the right total knee replacement.
2. Acute comminuted slightly impacted fracture involving the
proximal neck of the fibula with extension to the proximal tib-fib
joint.
3. No evidence of hardware failure or loosening involving the
femoral component of the right total knee replacement.
4. Sequela of prior intramedullary rod fixation of the distal femur.
There is no definitive evidence of hardware failure or loosening
however the cranial most cancellous screw is incompletely seated.

## 2020-04-24 IMAGING — CR DG ANKLE COMPLETE 3+V*R*
3 series · 3 of 3 positions shown · non-contrast
Comparison: None.

CLINICAL DATA: Post fall yesterday now with ankle pain.

EXAM:
RIGHT ANKLE - COMPLETE 3+ VIEW

[x ankle ap right]
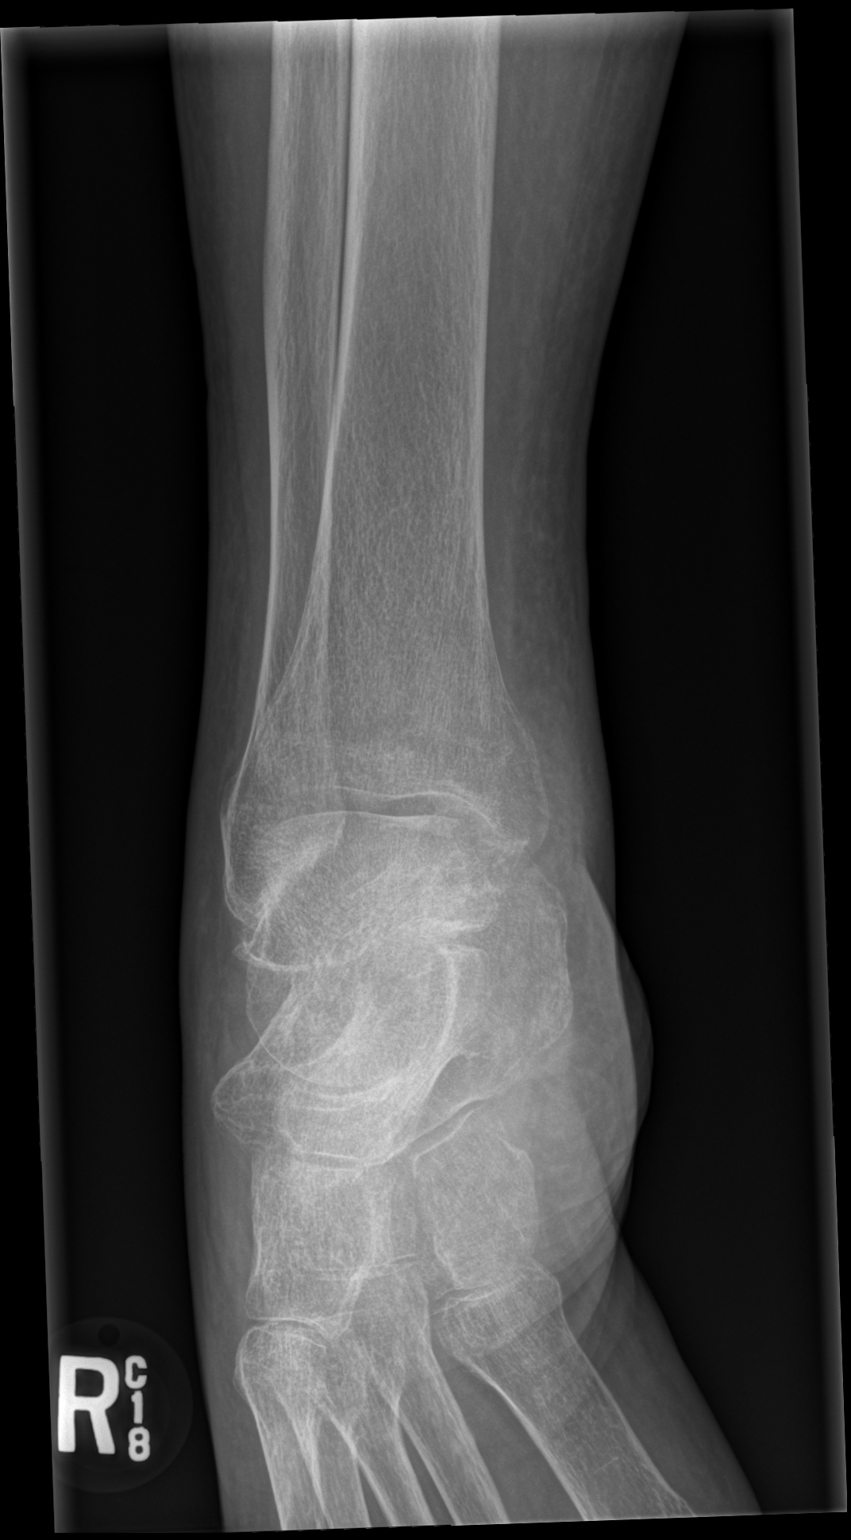

[x ankle obl right]
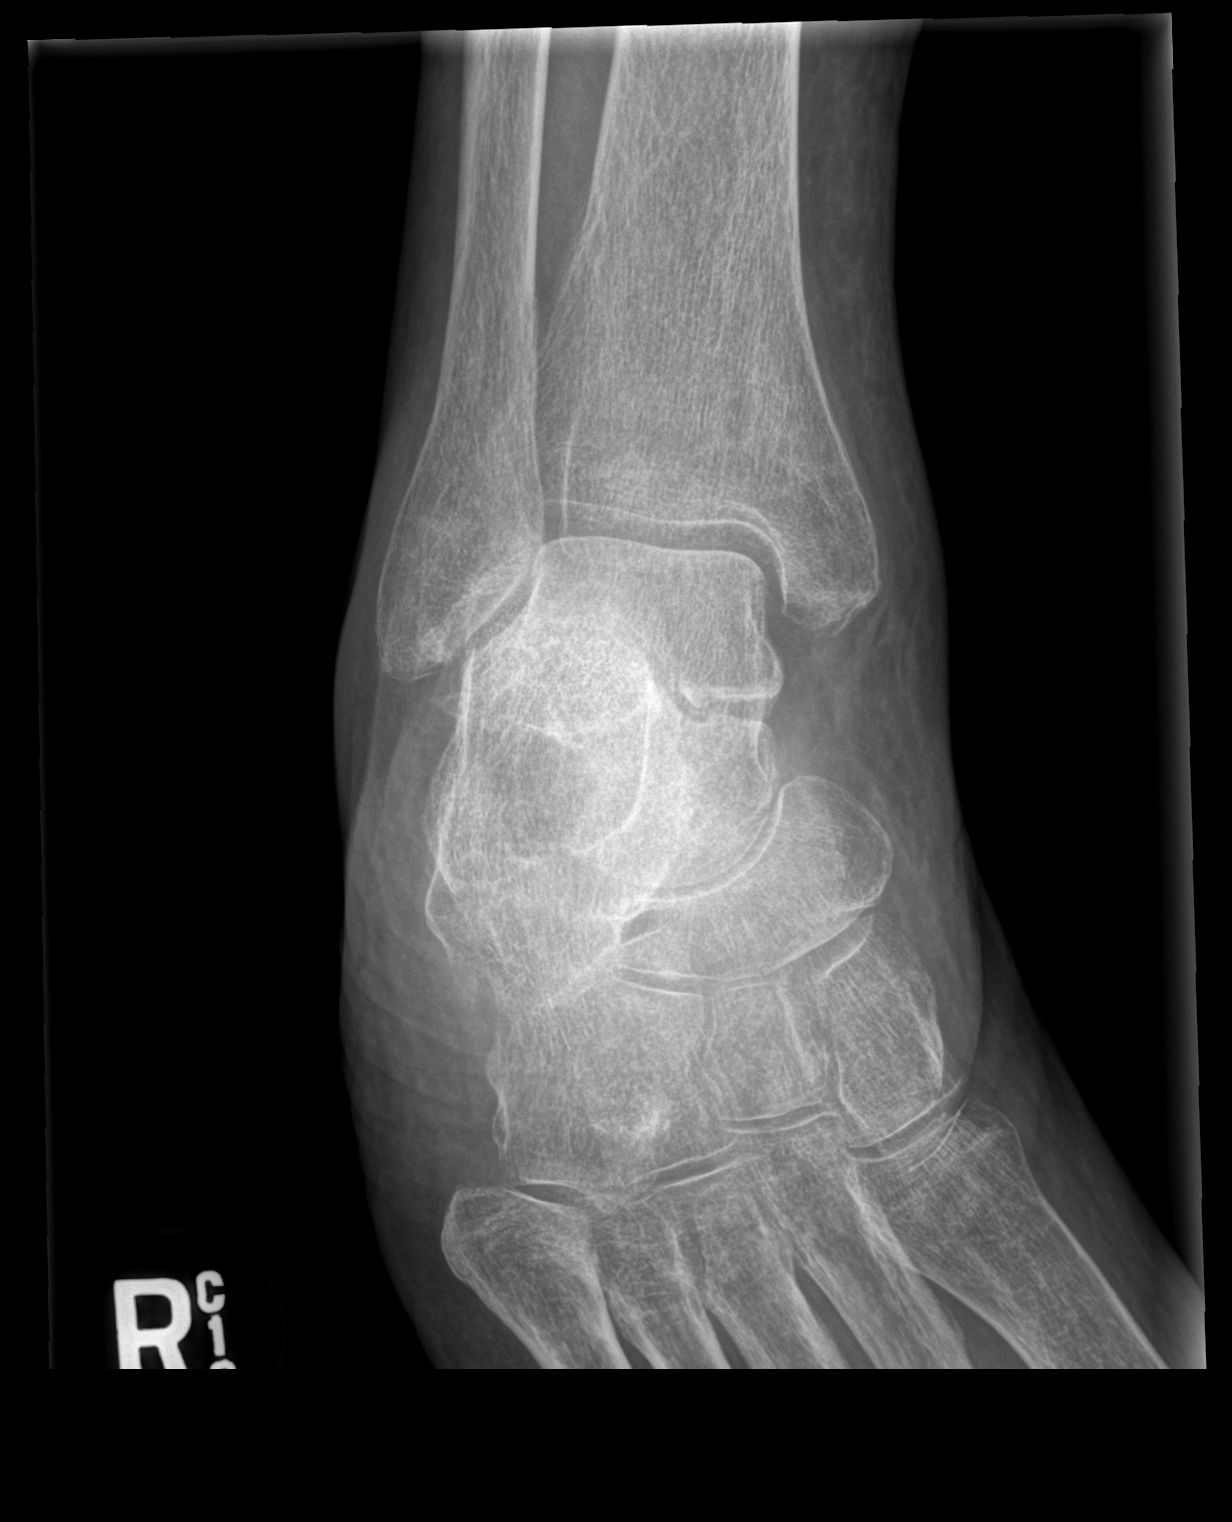

[x ankle lat right]
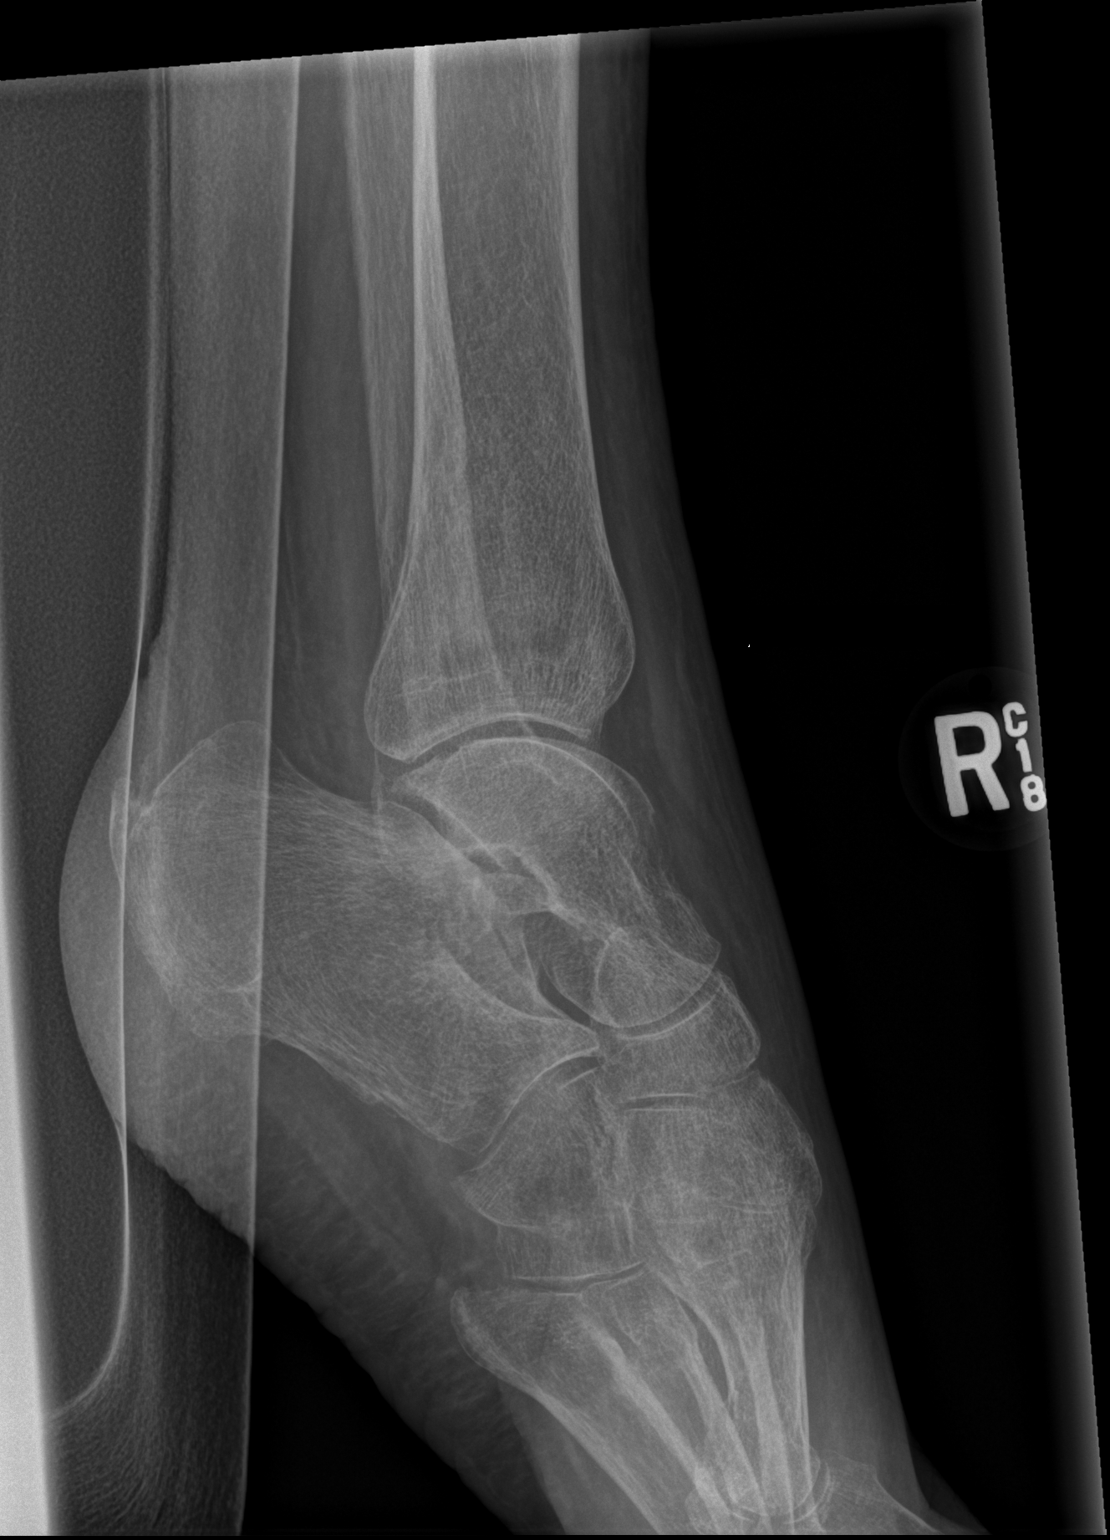

[3 of 3 positions shown; findings below may reference images not displayed]

FINDINGS: Osteopenia. No definite fracture or dislocation. Joint spaces are
preserved. Ankle mortise appears preserved given obliquity. Tiny
plantar calcaneal spur. Enthesopathic change involving the Achilles
tendon insertion site. Regional soft tissues appear normal. No
radiopaque foreign body.
IMPRESSION: Osteopenia without definitive displaced fracture.
